# Patient Record
Sex: Female | Born: 1940 | Race: White | Hispanic: No | Smoking: Former smoker
Health system: Southern US, Community
[De-identification: ages and names within clinical notes are randomized; demographics above are authoritative.]

## PROBLEM LIST (undated history)

## (undated) DIAGNOSIS — I639 Cerebral infarction, unspecified: Secondary | ICD-10-CM

## (undated) DIAGNOSIS — E119 Type 2 diabetes mellitus without complications: Secondary | ICD-10-CM

## (undated) DIAGNOSIS — K921 Melena: Secondary | ICD-10-CM

## (undated) DIAGNOSIS — K529 Noninfective gastroenteritis and colitis, unspecified: Secondary | ICD-10-CM

## (undated) DIAGNOSIS — I739 Peripheral vascular disease, unspecified: Secondary | ICD-10-CM

## (undated) DIAGNOSIS — I10 Essential (primary) hypertension: Secondary | ICD-10-CM

## (undated) DIAGNOSIS — H269 Unspecified cataract: Secondary | ICD-10-CM

## (undated) DIAGNOSIS — K219 Gastro-esophageal reflux disease without esophagitis: Secondary | ICD-10-CM

## (undated) DIAGNOSIS — C189 Malignant neoplasm of colon, unspecified: Secondary | ICD-10-CM

## (undated) DIAGNOSIS — J439 Emphysema, unspecified: Secondary | ICD-10-CM

## (undated) DIAGNOSIS — E785 Hyperlipidemia, unspecified: Secondary | ICD-10-CM

## (undated) DIAGNOSIS — C180 Malignant neoplasm of cecum: Secondary | ICD-10-CM

## (undated) DIAGNOSIS — N189 Chronic kidney disease, unspecified: Secondary | ICD-10-CM

## (undated) DIAGNOSIS — R0602 Shortness of breath: Secondary | ICD-10-CM

## (undated) HISTORY — PX: ABDOMINAL HYSTERECTOMY: SHX81

## (undated) HISTORY — DX: Hyperlipidemia, unspecified: E78.5

## (undated) HISTORY — DX: Chronic kidney disease, unspecified: N18.9

## (undated) HISTORY — DX: Peripheral vascular disease, unspecified: I73.9

## (undated) HISTORY — PX: COLONOSCOPY: SHX174

## (undated) HISTORY — DX: Malignant neoplasm of cecum: C18.0

## (undated) HISTORY — PX: TONSILLECTOMY: SUR1361

## (undated) HISTORY — DX: Cerebral infarction, unspecified: I63.9

## (undated) HISTORY — DX: Gastro-esophageal reflux disease without esophagitis: K21.9

## (undated) HISTORY — PX: DENTAL RESTORATION/EXTRACTION WITH X-RAY: SHX5796

## (undated) HISTORY — DX: Melena: K92.1

## (undated) HISTORY — DX: Unspecified cataract: H26.9

## (undated) HISTORY — DX: Emphysema, unspecified: J43.9

## (undated) HISTORY — DX: Noninfective gastroenteritis and colitis, unspecified: K52.9

## (undated) HISTORY — DX: Malignant neoplasm of colon, unspecified: C18.9

## (undated) HISTORY — DX: Essential (primary) hypertension: I10

## (undated) HISTORY — PX: OTHER SURGICAL HISTORY: SHX169

## (undated) SURGERY — Surgical Case
Anesthesia: *Unknown

---

## 2012-11-04 ENCOUNTER — Encounter: Payer: Self-pay | Admitting: Internal Medicine

## 2012-11-04 ENCOUNTER — Ambulatory Visit (INDEPENDENT_AMBULATORY_CARE_PROVIDER_SITE_OTHER): Payer: Medicare Other | Admitting: Internal Medicine

## 2012-11-04 ENCOUNTER — Other Ambulatory Visit (INDEPENDENT_AMBULATORY_CARE_PROVIDER_SITE_OTHER): Payer: Medicare Other

## 2012-11-04 VITALS — BP 162/94 | HR 94 | Temp 97.7°F | Ht 60.0 in | Wt 132.0 lb

## 2012-11-04 DIAGNOSIS — B379 Candidiasis, unspecified: Secondary | ICD-10-CM

## 2012-11-04 DIAGNOSIS — R030 Elevated blood-pressure reading, without diagnosis of hypertension: Secondary | ICD-10-CM

## 2012-11-04 DIAGNOSIS — K921 Melena: Secondary | ICD-10-CM

## 2012-11-04 DIAGNOSIS — R7309 Other abnormal glucose: Secondary | ICD-10-CM

## 2012-11-04 DIAGNOSIS — R7302 Impaired glucose tolerance (oral): Secondary | ICD-10-CM

## 2012-11-04 DIAGNOSIS — R5383 Other fatigue: Secondary | ICD-10-CM

## 2012-11-04 DIAGNOSIS — R5381 Other malaise: Secondary | ICD-10-CM

## 2012-11-04 DIAGNOSIS — R0602 Shortness of breath: Secondary | ICD-10-CM

## 2012-11-04 DIAGNOSIS — B49 Unspecified mycosis: Secondary | ICD-10-CM

## 2012-11-04 LAB — LDL CHOLESTEROL, DIRECT: Direct LDL: 116 mg/dL

## 2012-11-04 LAB — CBC
Hemoglobin: 16.9 g/dL — ABNORMAL HIGH (ref 12.0–15.0)
MCHC: 32 g/dL (ref 30.0–36.0)
MCV: 88.2 fl (ref 78.0–100.0)
Platelets: 192 10*3/uL (ref 150.0–400.0)

## 2012-11-04 LAB — LIPID PANEL
Cholesterol: 207 mg/dL — ABNORMAL HIGH (ref 0–200)
Total CHOL/HDL Ratio: 3

## 2012-11-04 LAB — BASIC METABOLIC PANEL
BUN: 12 mg/dL (ref 6–23)
Creatinine, Ser: 0.8 mg/dL (ref 0.4–1.2)
GFR: 77.51 mL/min (ref >60.00)

## 2012-11-04 LAB — HEMOGLOBIN A1C: Hgb A1c MFr Bld: 6.7 % — ABNORMAL HIGH (ref 4.6–6.5)

## 2012-11-04 MED ORDER — FLUCONAZOLE 150 MG PO TABS: 150.0000 mg | ORAL_TABLET | Freq: Once | ORAL | Status: DC

## 2012-11-04 NOTE — Patient Instructions (Signed)

## 2012-11-04 NOTE — Progress Notes (Signed)
HPI  Pt presents to the clinic today to establish care. She has not had a PCP in the past. She does go to urgent care when she needs something. She does have a number of concerns today. 1- She keeps having these recurrent yeast infections. She has taken 3 doses of Diflucan in the past 2 months. The infection will clear up then come right back. She is unsure why she is getting these recurrent yeast infections but would like another RX for Diflucan as the yeast infection is back today. 2- This past Saturday, she noticed some blood on the toilet paper after she wiped. She has never seen blood in her stool before and hasn't seen any since. She reports that she has never had hemorrhoids. She denies abdominal pain. She has had a colonoscopy x 1 in the past, and is past due for her repeat screening. She has no history of colon cancer in her family. 3- She does c/o some intermittent shortness of breath. This is new for her. She is not currently smoking but did smoke for 55 years. She has no chest pain when she experiences this shortness of breath. She does have a history of emphysema and is concerned that it may be getting worse.  Flu: 07/2012 Pneumovax: 2012 Tetanus: 2014 Pap smear: more than 5 years ago Mammogram: never LMP: hysterectomy Eye doctor: never Dentist: as needed  Past Medical History  Diagnosis Date  . Uterine cancer   . Emphysema of lung   . GERD (gastroesophageal reflux disease)   . Hypertension   . [name]     Current Outpatient Prescriptions  Medication Sig Dispense Refill  . fluconazole (DIFLUCAN) 150 MG tablet Take 1 tablet (150 mg total) by mouth once.  1 tablet  2   No current facility-administered medications for this visit.    No Known Allergies  Family History  Problem Relation Age of Onset  . Hypertension Father   . Diabetes Father   . Cancer Neg Hx   . Early death Neg Hx   . Stroke Neg Hx     History   Social History  . Marital Status: Divorced   Spouse Name: N/A    Number of Children: 1  . Years of Education: N/A   Occupational History  . Not on file.   Social History Main Topics  . Smoking status: Former Smoker -- 55 years  . Smokeless tobacco: Never Used  . Alcohol Use: No  . Drug Use: No  . Sexually Active: No   Other Topics Concern  . Not on file   Social History Narrative   Regular exercise-no   Caffeine Use-yes    ROS:  Constitutional: Pt reports fatiuge. Denies fever, malaise, headache or abrupt weight changes.  HEENT: Denies eye pain, eye redness, ear pain, ringing in the ears, wax buildup, runny nose, nasal congestion, bloody nose, or sore throat. Respiratory: Pt reports shortness of breath. Denies difficulty breathing, cough or sputum production.   Cardiovascular: Denies chest pain, chest tightness, palpitations or swelling in the hands or feet.  Gastrointestinal: Denies abdominal pain, bloating, constipation, diarrhea or blood in the stool.  GU: Denies frequency, urgency, pain with urination, blood in urine, odor or discharge. Musculoskeletal: Denies decrease in range of motion, difficulty with gait, muscle pain or joint pain and swelling.  Skin: Denies redness, rashes, lesions or ulcercations.  Neurological: Denies dizziness, difficulty with memory, difficulty with speech or problems with balance and coordination.   No other specific  complaints in a complete review of systems (except as listed in HPI above).  PE:  BP 162/94  Pulse 94  Temp(Src) 97.7 F (36.5 C) (Oral)  Ht 5' (1.524 m)  Wt 132 lb (59.875 kg)  BMI 25.78 kg/m2  SpO2 95% Wt Readings from Last 3 Encounters:  11/04/12 132 lb (59.875 kg)    General: Appears her stated age, well developed, well nourished in NAD. HEENT: Head: normal shape and size; Eyes: sclera white, no icterus, conjunctiva pink, PERRLA and EOMs intact; Ears: Tm's gray and intact, normal light reflex; Nose: mucosa pink and moist, septum midline; Throat/Mouth: Teeth  present, mucosa pink and moist, no lesions or ulcerations noted.  Neck: Normal range of motion. Neck supple, trachea midline. No massses, lumps or thyromegaly present.  Cardiovascular: Normal rate and rhythm. S1,S2 noted.  No murmur, rubs or gallops noted. No JVD or BLE edema. No carotid bruits noted. Pulmonary/Chest: Normal effort and positive vesicular breath sounds. No respiratory distress. No wheezes, rales or ronchi noted.  Abdomen: Soft and nontender. Normal bowel sounds, no bruits noted. No distention or masses noted. Liver, spleen and kidneys non palpable. Musculoskeletal: Normal range of motion. No signs of joint swelling. No difficulty with gait.  Neurological: Alert and oriented. Cranial nerves II-XII intact. Coordination normal. +DTRs bilaterally. Psychiatric: Mood and affect normal. Behavior is normal. Judgment and thought content normal.    Assessment and Plan:  Preventative Health Maintenance:  Pt declines setting up for pap smear and mammogram Pt would like to be set up for her colonoscopy  Recurrent yeast infections:  eRx for Diflucan 150 mg po x 1 Wash with gentle soap like dove Do not douche  [name], new onset with additional workup required:  Will obtain chest xray to r/o lung etiology If negative, may need to refer to cardiology for stress testing  Fatigue:  Will obtain some basic lab testing  Glucose intolerance:   Will check HgBA1C to assess for diabetes  Elevated blood pressures, not diagnosed as hypertension:  Monitor blood pressures at home If persist above 130/90, call us, we will need to start treatment to reduce potentially harmful outcomes  RTC in6 months or sooner if needed

## 2012-11-06 ENCOUNTER — Encounter: Payer: Self-pay | Admitting: Internal Medicine

## 2012-11-06 ENCOUNTER — Ambulatory Visit (INDEPENDENT_AMBULATORY_CARE_PROVIDER_SITE_OTHER): Payer: Medicare Other | Admitting: Internal Medicine

## 2012-11-06 VITALS — BP 164/88 | HR 77 | Temp 97.6°F | Ht 60.0 in | Wt 132.0 lb

## 2012-11-06 DIAGNOSIS — I10 Essential (primary) hypertension: Secondary | ICD-10-CM

## 2012-11-06 DIAGNOSIS — E119 Type 2 diabetes mellitus without complications: Secondary | ICD-10-CM

## 2012-11-06 DIAGNOSIS — J438 Other emphysema: Secondary | ICD-10-CM

## 2012-11-06 MED ORDER — FLUTICASONE-SALMETEROL 100-50 MCG/DOSE IN AEPB: 1.0000 | INHALATION_SPRAY | Freq: Two times a day (BID) | RESPIRATORY_TRACT | Status: DC

## 2012-11-06 MED ORDER — LISINOPRIL 10 MG PO TABS: 10.0000 mg | ORAL_TABLET | Freq: Every day | ORAL | Status: DC

## 2012-11-06 NOTE — Patient Instructions (Signed)
Diabetes and Exercise Regular exercise is important and can help:   Control blood glucose (sugar).  Decrease blood pressure.    Control blood lipids (cholesterol, triglycerides).  Improve overall health. BENEFITS FROM EXERCISE  Improved fitness.  Improved flexibility.  Improved endurance.  Increased bone density.  Weight control.  Increased muscle strength.  Decreased body fat.  Improvement of the body's use of insulin, a hormone.  Increased insulin sensitivity.  Reduction of insulin needs.  Reduced stress and tension.  Helps you feel better. People with diabetes who add exercise to their lifestyle gain additional benefits, including:  Weight loss.  Reduced appetite.  Improvement of the body's use of blood glucose.  Decreased risk factors for heart disease:  Lowering of cholesterol and triglycerides.  Raising the level of good cholesterol (high-density lipoproteins, HDL).  Lowering blood sugar.  Decreased blood pressure. TYPE 1 DIABETES AND EXERCISE  Exercise will usually lower your blood glucose.  If blood glucose is greater than 240 mg/dl, check urine ketones. If ketones are present, do not exercise.  Location of the insulin injection sites may need to be adjusted with exercise. Avoid injecting insulin into areas of the body that will be exercised. For example, avoid injecting insulin into:  The arms when playing tennis.  The legs when jogging. For more information, discuss this with your caregiver.  Keep a record of:  Food intake.  Type and amount of exercise.  Expected peak times of insulin action.  Blood glucose levels. Do this before, during, and after exercise. Review your records with your caregiver. This will help you to develop guidelines for adjusting food intake and insulin amounts.  TYPE 2 DIABETES AND EXERCISE  Regular physical activity can help control blood glucose.  Exercise is important because it may:  Increase the  body's sensitivity to insulin.  Improve blood glucose control.  Exercise reduces the risk of heart disease. It decreases serum cholesterol and triglycerides. It also lowers blood pressure.  Those who take insulin or oral hypoglycemic agents should watch for signs of hypoglycemia. These signs include dizziness, shaking, sweating, chills, and confusion.  Body water is lost during exercise. It must be replaced. This will help to avoid loss of body fluids (dehydration) or heat stroke. Be sure to talk to your caregiver before starting an exercise program to make sure it is safe for you. Remember, any activity is better than none.  Document Released: 11/10/2003 Document Revised: 11/12/2011 Document Reviewed: 02/24/2009 [facility] Patient Information 2013 [place], [place]. Diabetes and Standards of Medical Care  Diabetes is complicated. You may find that your diabetes team includes a dietitian, nurse, diabetes educator, eye doctor, and more. To help everyone know what is going on and to help you get the care you deserve, the following schedule of care was developed to help keep you on track. Below are the tests, exams, vaccines, medicines, education, and plans you will need. A1c test  Performed at least 2 times a year if you are meeting treatment goals.  Performed 4 times a year if therapy has changed or if you are not meeting therapy/glycemic goals. Aspirin medicine  Take daily as directed by your caregiver. Blood pressure test  Performed at every routine medical visit. The goal is less than 130/80 mm/Hg. Dental exam  Get a dental exam at least 2 times a year. Dilated eye exam (retinal exam)  Type 1 diabetes: Get an exam within 5 years of diagnosis and then yearly.  Type 2 diabetes: Get an exam at diagnosis  and then yearly. All exams thereafter can be extended to every 2 to 3 years if one or more exams have been normal. Foot care exam  Visual foot exams are performed at every routine  medical visit. The exams check for cuts, injuries, or other problems with the feet.  A comprehensive foot exam should be done yearly. This includes visual inspection as well as assessing foot pulses and testing for loss of sensation. Kidney function test (urine microalbumin)  Performed once a year.  Type 1 diabetes: The first test is performed 5 years after diagnosis.  Type 2 diabetes: The first test is performed at the time of diagnosis.  A serum creatinine and estimated glomerular filtration rate (eGFR) test is done once a year to tell the level of chronic kidney disease (CKD), if present. Lipid profile (Cholesterol, HDL, LDL, Triglycerides)  Performed once a year for most people. If at low risk, may be assessed every 2 years.  The goal for LDL is less than 100 mg/dl. If at high risk, the goal is less than 70 mg/dl.  The goal for HDL is higher than 40 mg/dl for men and higher than 50 mg/dl for women.  The goal for triglycerides is less than 150 mg/dl. Flu vaccine, pneumonia vaccine, and hepatitis B vaccine  The flu vaccine is recommended yearly.  The pneumonia vaccine is generally given once in a lifetime. However, there are some instances where another vaccine is recommended. Check with your caregiver.  The hepatitis B vaccine is also recommended for adults with diabetes. Diabetes self-management education  Recommended at diagnosis and ongoing as needed. Treatment plan  Reviewed at every medical visit. Document Released: 06/17/2009 Document Revised: 11/12/2011 Document Reviewed: 02/20/2011 [facility] Patient Information 2013 [place], [place]. Diabetes Meal Planning Guide The diabetes meal planning guide is a tool to help you plan your meals and snacks. It is important for people with diabetes to manage their blood glucose (sugar) levels. Choosing the right foods and the right amounts throughout your day will help control your blood glucose. Eating right can even help you improve  your blood pressure and reach or maintain a healthy weight. CARBOHYDRATE COUNTING MADE EASY When you eat carbohydrates, they turn to sugar. This raises your blood glucose level. Counting carbohydrates can help you control this level so you feel better. When you plan your meals by counting carbohydrates, you can have more flexibility in what you eat and balance your medicine with your food intake. Carbohydrate counting simply means adding up the total amount of carbohydrate grams in your meals and snacks. Try to eat about the same amount at each meal. Foods with carbohydrates are listed below. Each portion below is 1 carbohydrate serving or 15 grams of carbohydrates. Ask your dietician how many grams of carbohydrates you should eat at each meal or snack. Grains and Starches  1 slice bread.   English muffin or hotdog/hamburger bun.   cup cold cereal (unsweetened).   cup cooked pasta or rice.   cup starchy vegetables (corn, potatoes, peas, beans, winter squash).  1 tortilla (6 inches).   bagel.  1 waffle or pancake (size of a CD).   cup cooked cereal.  4 to 6 small crackers. *Whole grain is recommended. Fruit  1 cup fresh unsweetened berries, melon, papaya, pineapple.  1 small fresh fruit.   banana or mango.   cup fruit juice (4 oz unsweetened).   cup canned fruit in natural juice or water.  2 tbs dried fruit.  12 to  15 grapes or cherries. Milk and Yogurt  1 cup fat-free or 1% milk.  1 cup soy milk.  6 oz light yogurt with sugar-free sweetener.  6 oz low-fat soy yogurt.  6 oz plain yogurt. Vegetables  1 cup raw or  cup cooked is counted as 0 carbohydrates or a "free" food.  If you eat 3 or more servings at 1 meal, count them as 1 carbohydrate serving. Other Carbohydrates   oz chips or pretzels.   cup ice cream or frozen yogurt.   cup sherbet or sorbet.  2 inch square cake, no frosting.  1 tbs honey, sugar, jam, jelly, or syrup.  2 small  cookies.  3 squares of graham crackers.  3 cups popcorn.  6 crackers.  1 cup broth-based soup.  Count 1 cup casserole or other mixed foods as 2 carbohydrate servings.  Foods with less than 20 calories in a serving may be counted as 0 carbohydrates or a "free" food. You may want to purchase a book or computer software that lists the carbohydrate gram counts of different foods. In addition, the nutrition facts panel on the labels of the foods you eat are a good source of this information. The label will tell you how big the serving size is and the total number of carbohydrate grams you will be eating per serving. Divide this number by 15 to obtain the number of carbohydrate servings in a portion. Remember, 1 carbohydrate serving equals 15 grams of carbohydrate. SERVING SIZES Measuring foods and serving sizes helps you make sure you are getting the right amount of food. The list below tells how big or small some common serving sizes are.  1 oz.........4 stacked dice.  3 oz........Mar[place] KitchenDeck of cards.  1 tsp.......Marland KitchenTip of little finger.  1 tbs......Marland KitchenMarland KitchenThumb.  2 tbs.......Marland KitchenGolf ball.   cup......Marland KitchenHalf of a fist.  1 cup.......Marland KitchenA fist. SAMPLE DIABETES MEAL PLAN Below is a sample meal plan that includes foods from the grain and starches, dairy, vegetable, fruit, and meat groups. A dietician can individualize a meal plan to fit your calorie needs and tell you the number of servings needed from each food group. However, controlling the total amount of carbohydrates in your meal or snack is more important than making sure you include all of the food groups at every meal. You may interchange carbohydrate containing foods (dairy, starches, and fruits). The meal plan below is an example of a 2000 calorie diet using carbohydrate counting. This meal plan has 17 carbohydrate servings. Breakfast  1 cup oatmeal (2 carb servings).   cup light yogurt (1 carb serving).  1 cup blueberries (1 carb  serving).   cup almonds. Snack  1 large apple (2 carb servings).  1 low-fat string cheese stick. Lunch  Chicken breast salad.  1 cup spinach.   cup chopped tomatoes.  2 oz chicken breast, sliced.  2 tbs low-fat [place] dressing.  12 whole-wheat crackers (2 carb servings).  12 to 15 grapes (1 carb serving).  1 cup low-fat milk (1 carb serving). Snack  1 cup carrots.   cup hummus (1 carb serving). Dinner  3 oz broiled salmon.  1 cup brown rice (3 carb servings). Snack  1  cups steamed broccoli (1 carb serving) drizzled with 1 tsp olive oil and lemon juice.  1 cup light pudding (2 carb servings). DIABETES MEAL PLANNING WORKSHEET Your dietician can use this worksheet to help you decide how many servings of foods and what types of foods are right for you.  BREAKFAST Food Group and Servings / Carb Servings Grain/Starches __________________________________ Dairy __________________________________________ Vegetable ______________________________________ Fruit ___________________________________________ Meat __________________________________________ Fat ____________________________________________ LUNCH Food Group and Servings / Carb Servings Grain/Starches ___________________________________ Dairy ___________________________________________ Fruit ____________________________________________ Meat ___________________________________________ Fat _____________________________________________ Laural Golden Food Group and Servings / Carb Servings Grain/Starches ___________________________________ Dairy ___________________________________________ Fruit ____________________________________________ Meat ___________________________________________ Fat _____________________________________________ SNACKS Food Group and Servings / Carb Servings Grain/Starches ___________________________________ Dairy ___________________________________________ Vegetable  _______________________________________ Fruit ____________________________________________ Meat ___________________________________________ Fat _____________________________________________ DAILY TOTALS Starches _________________________ Vegetable ________________________ Fruit ____________________________ Dairy ____________________________ Meat ____________________________ Fat ______________________________ Document Released: 05/17/2005 Document Revised: 11/12/2011 Document Reviewed: 03/28/2009 [employer] Patient Information 2013 [place], [place]. Diabetes, Eating Away From Home Sometimes, you might eat in a restaurant or have meals that are prepared by someone else. You can enjoy eating out. However, the portions in restaurants may be much larger than needed. Listed below are some ideas to help you choose foods that will keep your blood glucose (sugar) in better control.  TIPS FOR EATING OUT  Know your meal plan and how many carbohydrate servings you should have at each meal. You may wish to carry a copy of your meal plan in your purse or wallet. Learn the foods included in each food group.  Make a list of restaurants near you that offer healthy choices. Take a copy of the carry-out menus to see what they offer. Then, you can plan what you will order ahead of time.  Become familiar with serving sizes by practicing them at home using measuring cups and spoons. Once you learn to recognize portion sizes, you will be able to correctly estimate the amount of total carbohydrate you are allowed to eat at the restaurant. Ask for a takeout box if the portion is more than you should have. When your food comes, leave the amount you should have on the plate, and put the rest in the takeout box before you start eating.  Plan ahead if your mealtime will be different from usual. Check with your caregiver to find out how to time meals and medicine if you are taking insulin.  Avoid high-fat foods, such as  fried foods, cream sauces, high-fat salad dressings, or any added butter or margarine.  Do not be afraid to ask questions. Ask your server about the portion size, cooking methods, ingredients and if items can be substituted. Restaurants do not list all available items on the menu. You can ask for your main entree to be prepared using skim milk, oil instead of butter or margarine, and without gravy or sauces. Ask your waiter or waitress to serve salad dressings, gravy, sauces, margarine, and sour cream on the side. You can then add the amount your meal plan suggests.  Add more vegetables whenever possible.  Avoid items that are labeled "jumbo," "giant," "deluxe," or "supersized."  You may want to split an entre with someone and order an extra side salad.  Watch for hidden calories in foods like croutons, bacon, or cheese.  Ask your server to take away the bread basket or chips from your table.  Order a dinner salad as an appetizer. You can eat most foods served in a restaurant. Some foods are better choices than others. Breads and Starches  Recommended: All kinds of bread (wheat, rye, white, oatmeal, [place], [place], raisin), hard or soft dinner rolls, frankfurter or hamburger buns, small bagels, small corn or whole-wheat flour tortillas.  Avoid: Frosted or glazed breads, butter rolls, egg or cheese breads, croissants, sweet rolls, pastries, coffee cake, glazed or frosted doughnuts, muffins.  Crackers  Recommended: Animal crackers, graham, rye, saltine, oyster, and matzoth crackers. Bread sticks, melba toast, rusks, pretzels, popcorn (without fat), zwieback toast.  Avoid: High-fat snack crackers or chips. Buttered popcorn. Cereals  Recommended: Hot and cold cereals. Whole grains such as oatmeal or shredded wheat are good choices.  Avoid: Sugar-coated or granola type cereals. Potatoes/Pasta/Rice/Beans  Recommended: Order baked, boiled, or mashed potatoes, rice or noodles without added  fat, whole beans. Order gravies, butter, margarine, or sauces on the side so you can control the amount you add.  Avoid: Hash browns or fried potatoes. Potatoes, pasta, or rice prepared with cream or cheese sauce. Potato or pasta salads prepared with large amounts of dressing. Fried beans or fried rice. Vegetables  Recommended: Order steamed, baked, boiled, or stewed vegetables without sauces or extra fat. Ask that sauce be served on the side. If vegetables are not listed on the menu, ask what is available.  Avoid: Vegetables prepared with cream, butter, or cheese sauce. Fried vegetables. Salad Bars  Recommended: Many of the vegetables at a salad bar are considered "free." Use lemon juice, vinegar, or low-calorie salad dressing (fewer than 20 calories per serving) as "free" dressings for your salad. Look for salad bar ingredients that have no added fat or sugar such as tomatoes, lettuce, cucumbers, broccoli, carrots, onions, and mushrooms.  Avoid: Prepared salads with large amounts of dressing, such as coleslaw, caesar salad, macaroni salad, bean salad, or carrot salad. Fruit  Recommended: Eat fresh fruit or fresh fruit salad without added dressing. A salad bar often offers fresh fruit choices, but canned fruit at a restaurant is usually packed in sugar or syrup.  Avoid: Sweetened canned or frozen fruits, plain or sweetened fruit juice. Fruit salads with dressing, sour cream, or sugar added to them. Meat and Meat Substitutes  Recommended: Order broiled, baked, roasted, or grilled meat, poultry, or fish. Trim off all visible fat. Do not eat the skin of poultry. The size stated on the menu is the raw weight. Meat shrinks by  in cooking (for example, 4 oz raw equals 3 oz cooked meat).  Avoid: Deep-fat fried meat, poultry, or fish. Breaded meats. Eggs  Recommended: Order soft, hard-cooked, poached, or scrambled eggs. Omelets may be okay, depending on what ingredients are added. Egg substitutes  are also a good choice.  Avoid: Fried eggs, eggs prepared with cream or cheese sauce. Milk  Recommended: Order low-fat or fat-free milk according to your meal plan. Plain, nonfat yogurt or flavored yogurt with no sugar added may be used as a substitute for milk. Soy milk may also be used.  Avoid: Milk shakes or sweetened milk beverages. Soups and Combination Foods  Recommended: Clear broth or consomm are "free" foods and may be used as an appetizer. Broth-based soups with fat removed count as a starch serving and are preferred over cream soups. Soups made with beans or split peas may be eaten but count as a starch.  Avoid: Fatty soups, soup made with cream, cheese soup. Combination foods prepared with excessive amounts of fat or with cream or cheese sauces. Desserts and Sweets  Recommended: Ask for fresh fruit. Sponge or angel food cake without icing, ice milk, no sugar added ice cream, sherbet, or frozen yogurt may fit into your meal plan occasionally.  Avoid: Pastries, puddings, pies, cakes with icing, custard, gelatin desserts. Fats and Oils  Recommended: Choose healthy fats such as olive oil, canola oil, or tub margarine, reduced fat or fat-free sour cream, cream cheese, avocado,  or nuts.  Avoid: Any fats in excess of your allowed portion. Deep-fried foods or any food with a large amount of fat. Note: Ask for all fats to be served on the side, and limit your portion sizes according to your meal plan. Document Released: 08/20/2005 Document Revised: 11/12/2011 Document Reviewed: 03/10/2009 [facility] Patient Information 2013 [place], [place]. Diabetes, Frequently Asked Questions WHAT IS DIABETES? Most of the food we eat is turned into glucose (sugar). Our bodies use it for energy. The pancreas makes a hormone called insulin. It helps glucose get into the cells of our bodies. When you have diabetes, your body either does not make enough insulin or cannot use its own insulin as well as it  should. This causes sugars to build up in your blood. WHAT ARE THE SYMPTOMS OF DIABETES?  Frequent urination.  Excessive thirst.  Unexplained weight loss.  Extreme hunger.  Blurred vision.  Tingling or numbness in hands or feet.  Feeling very tired much of the time.  Dry, itchy skin.  Sores that are slow to heal.  Yeast infections. WHAT ARE THE TYPES OF DIABETES? Type 1 Diabetes   About 10% of affected people have this type.  Usually occurs before the age of 41.  Usually occurs in thin to normal weight people. Type 2 Diabetes  About 90% of affected people have this type.  Usually occurs after the age of 17.  Usually occurs in overweight people.  More likely to have:  A family history of diabetes.  A history of diabetes during pregnancy (gestational diabetes).  High blood pressure.  High cholesterol and triglycerides. Gestational Diabetes  Occurs in about 4% of pregnancies.  Usually goes away after the baby is born.  More likely to occur in women with:  Family history of diabetes.  Previous gestational diabetes.  Obese.  Over 30 years old. WHAT IS PRE-DIABETES? Pre-diabetes means your blood glucose is higher than normal, but lower than the diabetes range. It also means you are at risk of getting type 2 diabetes and heart disease. If you are told you have pre-diabetes, have your blood glucose checked again in 1 to 2 years. WHAT IS THE TREATMENT FOR DIABETES? Treatment is aimed at keeping blood glucose near normal levels at all times. Learning how to manage this yourself is important in treating diabetes. Depending on the type of diabetes you have, your treatment will include one or more of the following:  Monitoring your blood glucose.  Meal planning.  Exercise.  Oral medicine (pills) or insulin. CAN DIABETES BE PREVENTED? With type 1 diabetes, prevention is more difficult, because the triggers that cause it are not yet known. With type 2  diabetes, prevention is more likely, with lifestyle changes:  Maintain a healthy weight.  Eat healthy.  Exercise. IS THERE A CURE FOR DIABETES? No, there is no cure for diabetes. There is a lot of research going on that is looking for a cure, and progress is being made. Diabetes can be treated and controlled. People with diabetes can manage their diabetes and lead normal, active lives. SHOULD I BE TESTED FOR DIABETES? If you are at least 71 years old, you should be tested for diabetes. You should be tested again every 3 years. If you are 45 or older and overweight, you may want to get tested more often. If you are younger than 45, overweight, and have one or more of the following risk factors, you should be tested:  Family history of diabetes.  Inactive lifestyle.  High blood pressure. WHAT ARE SOME OTHER SOURCES FOR INFORMATION ON DIABETES? The following organizations may help in your search for more information on diabetes: National Diabetes Education Program (NDEP) Internet: SolarDiscussions.es American Diabetes Association Internet: http://www.diabetes.org  Juvenile Diabetes Foundation International Internet: WetlessWash.is Document Released: 08/23/2003 Document Revised: 11/12/2011 Document Reviewed: 06/17/2009 [facility] Patient Information 2013 [place], [place]. Diabetes, Type 2 Diabetes is a long-lasting (chronic) disease. In type 2 diabetes, the pancreas does not make enough insulin (a hormone), and the body does not respond normally to the insulin that is made. This type of diabetes was also previously called adult-onset diabetes. It usually occurs after the age of 16, but it can occur at any age.  CAUSES  Type 2 diabetes happens because the pancreasis not making enough insulin or your body has trouble using the insulin that your pancreas does make properly. SYMPTOMS   Drinking more than usual.  Urinating more than usual.  Blurred vision.  Dry, itchy  skin.  Frequent infections.  Feeling more tired than usual (fatigue). DIAGNOSIS The diagnosis of type 2 diabetes is usually made by one of the following tests:  Fasting blood glucose test. You will not eat for at least 8 hours and then take a blood test.  Random blood glucose test. Your blood glucose (sugar) is checked at any time of the day regardless of when you ate.  Oral glucose tolerance test (OGTT). Your blood glucose is measured after you have not eaten (fasted) and then after you drink a glucose containing beverage. TREATMENT   Healthy eating.  Exercise.  Medicine, if needed.  Monitoring blood glucose.  Seeing your caregiver regularly. HOME CARE INSTRUCTIONS   Check your blood glucose at least once a day. More frequent monitoring may be necessary, depending on your medicines and on how well your diabetes is controlled. Your caregiver will advise you.  Take your medicine as directed by your caregiver.  Do not smoke.  Make wise food choices. Ask your caregiver for information. Weight loss can improve your diabetes.  Learn about low blood glucose (hypoglycemia) and how to treat it.  Get your eyes checked regularly.  Have a yearly physical exam. Have your blood pressure checked and your blood and urine tested.  Wear a pendant or bracelet saying that you have diabetes.  Check your feet every night for cuts, sores, blisters, and redness. Let your caregiver know if you have any problems. SEEK MEDICAL CARE IF:   You have problems keeping your blood glucose in target range.  You have problems with your medicines.  You have symptoms of an illness that do not improve after 24 hours.  You have a sore or wound that is not healing.  You notice a change in vision or a new problem with your vision.  You have a fever. MAKE SURE YOU:  Understand these instructions.  Will watch your condition.  Will get help right away if you are not doing well or get  worse. Document Released: 08/20/2005 Document Revised: 11/12/2011 Document Reviewed: 02/05/2011 [facility] Patient Information 2013 [place], [place].

## 2012-11-06 NOTE — Progress Notes (Signed)
Subjective:    Patient ID: Sherry Lambert, female    DOB: 1940-09-28, 72 y.o.   MRN: [id number]  HPI  Pt presents to the clinic today to f/u labs. At her last visit, her HgBA1C was 6.7%. She has never been told that she has diabetes in the past. She has never been on diabetes medication. She denies blurred vision, increased thirst or urination, or numbness or tingling in the hands or feet. She does eat a lot of fast food and carbohydrates. She has never seen a nutritionist. Additionally, at her last visit, her BP was in the 160/90's. She has never been told that she had high blood pressure. She does not monitor it at home. She denies headaches or uncontrolled bleeding. Today her blood pressure is 164/88. She was also told at her last visit that her chest xray showed signs of emphysema. She is wondering if she should be started on a daily medication to help prevent progression of the disease even though at this time she denies and shortness of breath, wheezing or difficulty breathing. Review of Systems      Past Medical History  Diagnosis Date  . Uterine cancer   . Emphysema of lung   . GERD (gastroesophageal reflux disease)   . Hypertension   . [name]     Current Outpatient Prescriptions  Medication Sig Dispense Refill  . fluconazole (DIFLUCAN) 150 MG tablet Take 1 tablet (150 mg total) by mouth once.  1 tablet  2   No current facility-administered medications for this visit.    No Known Allergies  Family History  Problem Relation Age of Onset  . Hypertension Father   . Diabetes Father   . Cancer Neg Hx   . Early death Neg Hx   . Stroke Neg Hx     History   Social History  . Marital Status: Divorced    Spouse Name: N/A    Number of Children: 1  . Years of Education: N/A   Occupational History  . Not on file.   Social History Main Topics  . Smoking status: Former Smoker -- 55 years  . Smokeless tobacco: Never Used  . Alcohol Use: No  . Drug Use: No  .  Sexually Active: No   Other Topics Concern  . Not on file   Social History Narrative   Regular exercise-no   Caffeine Use-yes     Constitutional: Denies fever, malaise, fatigue, headache or abrupt weight changes.  HEENT: Denies eye pain, eye redness, ear pain, ringing in the ears, wax buildup, runny nose, nasal congestion, bloody nose, or sore throat. Cardiovascular: Denies chest pain, chest tightness, palpitations or swelling in the hands or feet.  GU: Denies urgency, frequency, pain with urination, burning sensation, blood in urine, odor or discharge. Neurological: Denies dizziness, difficulty with memory, difficulty with speech or problems with balance and coordination.   No other specific complaints in a complete review of systems (except as listed in HPI above).  Objective:   Physical Exam  BP 164/88  Pulse 77  Temp(Src) 97.6 F (36.4 C) (Oral)  Ht 5' (1.524 m)  Wt 132 lb (59.875 kg)  BMI 25.78 kg/m2  SpO2 97% Wt Readings from Last 3 Encounters:  11/06/12 132 lb (59.875 kg)  11/04/12 132 lb (59.875 kg)    General: Appears her stated age, well developed, well nourished in NAD. HEENT: Head: normal shape and size; Eyes: sclera white, no icterus, conjunctiva pink, PERRLA and EOMs intact; Ears:  Tm's gray and intact, normal light reflex; Nose: mucosa pink and moist, septum midline; Throat/Mouth: Teeth present, mucosa pink and moist, no exudate, lesions or ulcerations noted.  Cardiovascular: Normal rate and rhythm. S1,S2 noted.  No murmur, rubs or gallops noted. No JVD or BLE edema. No carotid bruits noted. Pulmonary/Chest: Normal effort and positive vesicular breath sounds. No respiratory distress. No wheezes, rales or ronchi noted.  Neurological: Alert and oriented. Cranial nerves II-XII intact. Coordination normal. +DTRs bilaterally.   BMET    Component Value Date/Time   NA 143 11/04/2012 1145   K 4.3 11/04/2012 1145   CL 104 11/04/2012 1145   CO2 30 11/04/2012 1145    GLUCOSE 111* 11/04/2012 1145   BUN 12 11/04/2012 1145   CREATININE 0.8 11/04/2012 1145   CALCIUM 10.3 11/04/2012 1145    Lipid Panel     Component Value Date/Time   CHOL 207* 11/04/2012 1145   TRIG 71.0 11/04/2012 1145   HDL 73.70 11/04/2012 1145   CHOLHDL 3 11/04/2012 1145   VLDL 14.2 11/04/2012 1145    CBC    Component Value Date/Time   WBC 10.7* 11/04/2012 1145   RBC 5.99* 11/04/2012 1145   HGB 16.9* 11/04/2012 1145   HCT 52.9* 11/04/2012 1145   PLT 192.0 11/04/2012 1145   MCV 88.2 11/04/2012 1145   MCHC 32.0 11/04/2012 1145   RDW 13.9 11/04/2012 1145    Hgb A1C Lab Results  Component Value Date   HGBA1C 6.7* 11/04/2012         Assessment & Plan:

## 2012-11-10 ENCOUNTER — Encounter: Payer: Self-pay | Admitting: Internal Medicine

## 2012-11-10 DIAGNOSIS — E118 Type 2 diabetes mellitus with unspecified complications: Secondary | ICD-10-CM | POA: Insufficient documentation

## 2012-11-10 DIAGNOSIS — I10 Essential (primary) hypertension: Secondary | ICD-10-CM | POA: Insufficient documentation

## 2012-11-10 DIAGNOSIS — J438 Other emphysema: Secondary | ICD-10-CM | POA: Insufficient documentation

## 2012-11-10 NOTE — Assessment & Plan Note (Signed)
Discussed disease process with patient and effects that it can have on your health Discussed foods to avoid Will not start blood sugar monitoring at this time Pt would like to try 3 months of lifestyle changes before starting medication Will refer to diabetes education and nutrition   RTC in 3 months to recheck HgbA1C

## 2012-11-10 NOTE — Assessment & Plan Note (Signed)
Start avoiding sodium in your diet Blood pressure monitor given, keep a log of your blood pressures until I see you next time eRx for Lisinopril 10 mg daily  RTC in 1 month for blood pressure check

## 2012-11-10 NOTE — Assessment & Plan Note (Signed)
Will start Advair BID Monitor symptoms and let me know if you feel they are getting worse

## 2012-11-12 ENCOUNTER — Encounter: Payer: Self-pay | Admitting: Gastroenterology

## 2012-12-01 ENCOUNTER — Encounter: Payer: Self-pay | Admitting: Gastroenterology

## 2012-12-01 ENCOUNTER — Ambulatory Visit (INDEPENDENT_AMBULATORY_CARE_PROVIDER_SITE_OTHER): Payer: Medicare Other | Admitting: Gastroenterology

## 2012-12-01 VITALS — BP 134/80 | HR 64 | Ht 60.0 in | Wt 129.6 lb

## 2012-12-01 DIAGNOSIS — R1310 Dysphagia, unspecified: Secondary | ICD-10-CM | POA: Insufficient documentation

## 2012-12-01 DIAGNOSIS — K625 Hemorrhage of anus and rectum: Secondary | ICD-10-CM

## 2012-12-01 NOTE — Assessment & Plan Note (Signed)
Limited rectal bleeding maybe hemorrhoidal. A more proximal colonic bleeding source should be ruled out.  Recommendations #1 colonoscopy

## 2012-12-01 NOTE — Progress Notes (Signed)
History of Present Illness: Pleasant 71-year-old white female referred for evaluation of dysphagia and rectal bleeding. For the past year she has been experiencing intermittent dysphagia. At times she has had to cough up food that has become lodged. She rarely has pyrosis. Patient also complains of intermittent rectal bleeding consisting of bright red blood on the toilet tissue. Since starting a low fat and sugar diet she's been somewhat constipated. Last colonoscopy was greater than 20 years ago. She denies melena or abdominal pain.    Past Medical History  Diagnosis Date  . Uterine cancer   . Emphysema of lung   . GERD (gastroesophageal reflux disease)   . Hypertension   . [name]   . Diabetes mellitus    Past Surgical History  Procedure Laterality Date  . Tonsillectomy    . Abdominal hysterectomy     family history includes Diabetes in her father and Hypertension in her father.  There is no history of Cancer, and Early death, and Stroke, . Current Outpatient Prescriptions  Medication Sig Dispense Refill  . Fluticasone-Salmeterol (ADVAIR) 100-50 MCG/DOSE AEPB Inhale 1 puff into the lungs 2 (two) times daily.  1 each  3  . lisinopril (PRINIVIL,ZESTRIL) 10 MG tablet Take 1 tablet (10 mg total) by mouth daily.  30 tablet  0   No current facility-administered medications for this visit.   Allergies as of 12/01/2012  . (No Known Allergies)    reports that she has quit smoking. She has never used smokeless tobacco. She reports that she does not drink alcohol or use illicit drugs.     Review of Systems: Pertinent positive and negative review of systems were noted in the above HPI section. All other review of systems were otherwise negative.  Vital signs were reviewed in today's medical record Physical Exam: General: Well developed , well nourished, no acute distress Skin: anicteric Head: Normocephalic and atraumatic Eyes:  sclerae anicteric, EOMI Ears: Normal auditory  acuity Mouth: No deformity or lesions Neck: Supple, no masses or thyromegaly Lungs: Clear throughout to auscultation Heart: Regular rate and rhythm; no murmurs, rubs or bruits Abdomen: Soft, non tender and non distended. No masses, hepatosplenomegaly or hernias noted. Normal Bowel sounds Rectal:deferred Musculoskeletal: Symmetrical with no gross deformities  Skin: No lesions on visible extremities Pulses:  Normal pulses noted Extremities: No clubbing, cyanosis, edema or deformities noted Neurological: Alert oriented x 4, grossly nonfocal Cervical Nodes:  No significant cervical adenopathy Inguinal Nodes: No significant inguinal adenopathy Psychological:  Alert and cooperative. Normal mood and affect       

## 2012-12-01 NOTE — Assessment & Plan Note (Signed)
Dysphagia to solids. 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  Recommendations #1 upper endoscopy with dilatation as indicated  Risks, alternatives, and complications of the procedure, including bleeding, perforation, and possible need for surgery, were explained to the patient.  Patient's questions were answered.

## 2012-12-01 NOTE — Patient Instructions (Addendum)
You have been scheduled for an endoscopy with propofol. Please follow written instructions given to you at your visit today. If you use inhalers (even only as needed), please bring them with you on the day of your procedure.  You have been scheduled for a colonoscopy with propofol. Please follow written instructions given to you at your visit today.  Please pick up your prep kit at the pharmacy within the next 1-3 days. If you use inhalers (even only as needed), please bring them with you on the day of your procedure.

## 2012-12-04 ENCOUNTER — Ambulatory Visit: Payer: Medicare Other | Admitting: *Deleted

## 2012-12-04 ENCOUNTER — Encounter: Payer: Self-pay | Admitting: Gastroenterology

## 2012-12-04 ENCOUNTER — Other Ambulatory Visit: Payer: Self-pay | Admitting: Gastroenterology

## 2012-12-04 ENCOUNTER — Ambulatory Visit (AMBULATORY_SURGERY_CENTER): Payer: Medicare Other | Admitting: Gastroenterology

## 2012-12-04 VITALS — BP 118/64 | HR 72 | Temp 96.7°F | Resp 17 | Ht 60.0 in | Wt 129.0 lb

## 2012-12-04 DIAGNOSIS — R1310 Dysphagia, unspecified: Secondary | ICD-10-CM

## 2012-12-04 DIAGNOSIS — K222 Esophageal obstruction: Secondary | ICD-10-CM

## 2012-12-04 MED ORDER — OMEPRAZOLE 20 MG PO CPDR: 20.0000 mg | DELAYED_RELEASE_CAPSULE | Freq: Every day | ORAL | Status: DC

## 2012-12-04 MED ORDER — DEXTROSE 5 % IV SOLN: INTRAVENOUS | Status: DC

## 2012-12-04 NOTE — Progress Notes (Signed)
Patient did not experience any of the following events: a burn prior to discharge; a fall within the facility; wrong site/side/patient/procedure/implant event; or a hospital transfer or hospital admission upon discharge from the facility. (G8907) Patient did not have preoperative order for IV antibiotic SSI prophylaxis. (G8918)  

## 2012-12-04 NOTE — Progress Notes (Signed)
Called to room to assist during endoscopic procedure.  Patient ID and intended procedure confirmed with present staff. Received instructions for my participation in the procedure from the performing physician.  

## 2012-12-04 NOTE — Op Note (Addendum)
[facility] [address] [place], [address]   ENDOSCOPY PROCEDURE REPORT  PATIENT: Sherry Lambert, Sherry Lambert  MR#: [id number] BIRTHDATE: 09-Apr-1941 , 71  yrs. old GENDER: Female ENDOSCOPIST: Louis Meckel, MD REFERRED BY: PROCEDURE DATE:  12/04/2012 PROCEDURE:  endoscopy with balloon ASA CLASS:     2 INDICATIONS:  dysphagia MEDICATIONS: propofol 150 mg IV TOPICAL ANESTHETIC:  DESCRIPTION OF PROCEDURE: After the risks benefits and alternatives of the procedure were thoroughly explained, informed consent was obtained.  The LB GIF-H180 [id number] endoscope was introduced through the mouth and advanced to the  The remainder of the exam is normal.GE junction. There was a tight stricture that did not allow passage of the 7 mm scope.surrounding mucosa was friable A balloon dilator was passed through the stricture inflated to 10 mm then 11 and 12 mm for 30 seconds each.  there was moderate resistance and heme. Following withdrawal of the balloon the scope easily traversed the area was passed into the stomach and duodenum.     . Without limitations.  The instrument was slowly withdrawn as the mucosa was fully examined.      .  retroflexed view the gastric cardia was normal     The scope was then withdrawn from the patient and the procedure completed.  COMPLICATIONS: There were no complications. 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

## 2012-12-04 NOTE — Patient Instructions (Addendum)
YOU HAD AN ENDOSCOPIC PROCEDURE TODAY AT THE [facility]: Refer to the procedure report that was given to you for any specific questions about what was found during the examination.  If the procedure report does not answer your questions, please call your gastroenterologist to clarify.  If you requested that your care partner not be given the details of your procedure findings, then the procedure report has been included in a sealed envelope for you to review at your convenience later.  YOU SHOULD EXPECT: Some feelings of bloating in the abdomen. Passage of more gas than usual.  Walking can help get rid of the air that was put into your GI tract during the procedure and reduce the bloating. If you had a lower endoscopy (such as a colonoscopy or flexible sigmoidoscopy) you may notice spotting of blood in your stool or on the toilet paper. If you underwent a bowel prep for your procedure, then you may not have a normal bowel movement for a few days.  DIET:   Follow dilatation diet given to you today  ACTIVITY: Your care partner should take you home directly after the procedure.  You should plan to take it easy, moving slowly for the rest of the day.  You can resume normal activity the day after the procedure however you should NOT DRIVE or use heavy machinery for 24 hours (because of the sedation medicines used during the test).    SYMPTOMS TO REPORT IMMEDIATELY: A gastroenterologist can be reached at any hour.  During normal business hours, 8:30 AM to 5:00 PM Monday through Friday, call [phone].  After hours and on weekends, please call the GI answering service at [phone] who will take a message and have the physician on call contact you.     Following upper endoscopy (EGD)  Vomiting of blood or coffee ground material  New chest pain or pain under the shoulder blades  Painful or persistently difficult swallowing  New shortness of breath  Fever of 100F or higher  Black,  tarry-looking stools  FOLLOW UP: If any biopsies were taken you will be contacted by phone or by letter within the next 1-3 weeks.  Call your gastroenterologist if you have not heard about the biopsies in 3 weeks.  Our staff will call the home number listed on your records the next business day following your procedure to check on you and address any questions or concerns that you may have at that time regarding the information given to you following your procedure. This is a courtesy call and so if there is no answer at the home number and we have not heard from you through the emergency physician on call, we will assume that you have returned to your regular daily activities without incident.  SIGNATURES/CONFIDENTIALITY: You and/or your care partner have signed paperwork which will be entered into your electronic medical record.  These signatures attest to the fact that that the information above on your After Visit Summary has been reviewed and is understood.  Full responsibility of the confidentiality of this discharge information lies with you and/or your care-partner.    Dilatation diet   Pick up omeprazole RX at your pharmacy

## 2012-12-04 NOTE — Progress Notes (Signed)
Stable to RR 

## 2012-12-05 ENCOUNTER — Telehealth: Payer: Self-pay | Admitting: *Deleted

## 2012-12-05 NOTE — Telephone Encounter (Signed)
  Follow up Call-  Call back number 12/04/2012  Post procedure Call Back phone  # [phone]  Permission to leave phone message Yes     Patient questions:  Do you have a fever, pain , or abdominal swelling? no Pain Score  0 *  Have you tolerated food without any problems? yes  Have you been able to return to your normal activities? yes  Do you have any questions about your discharge instructions: Diet   no Medications  no Follow up visit  no  Do you have questions or concerns about your Care? no  Actions: * If pain score is 4 or above: No action needed, pain <4.  C/O some throat irritation, states, "It's nothing too bad."  Told to call back if she has any concerns

## 2012-12-09 ENCOUNTER — Telehealth: Payer: Self-pay

## 2012-12-09 ENCOUNTER — Ambulatory Visit (INDEPENDENT_AMBULATORY_CARE_PROVIDER_SITE_OTHER): Payer: Medicare Other | Admitting: Internal Medicine

## 2012-12-09 ENCOUNTER — Encounter: Payer: Self-pay | Admitting: Internal Medicine

## 2012-12-09 VITALS — BP 130/86 | HR 76 | Temp 97.4°F | Ht 60.0 in | Wt 130.0 lb

## 2012-12-09 DIAGNOSIS — I10 Essential (primary) hypertension: Secondary | ICD-10-CM

## 2012-12-09 DIAGNOSIS — J438 Other emphysema: Secondary | ICD-10-CM

## 2012-12-09 MED ORDER — FLUTICASONE-SALMETEROL 100-50 MCG/DOSE IN AEPB: 1.0000 | INHALATION_SPRAY | Freq: Two times a day (BID) | RESPIRATORY_TRACT | Status: DC

## 2012-12-09 MED ORDER — LISINOPRIL 10 MG PO TABS: 10.0000 mg | ORAL_TABLET | Freq: Every day | ORAL | Status: DC

## 2012-12-09 NOTE — Assessment & Plan Note (Signed)
Well controlled on current therapy Continue lisinopril Avoid sodium in your diet

## 2012-12-09 NOTE — Patient Instructions (Signed)

## 2012-12-09 NOTE — Progress Notes (Signed)
Subjective:    Patient ID: Sherry Lambert, female    DOB: 08-27-1941, 72 y.o.   MRN: [id number]  HPI  Pt presents to the clinic today to f/u HTN. At her last visit, she was started on lisinopril. She has been tolerating the medication well without any side effects. Her blood pressure has been well controlled.  Review of Systems      Past Medical History  Diagnosis Date  . Uterine cancer   . Emphysema of lung   . GERD (gastroesophageal reflux disease)   . Hypertension   . [name]   . Diabetes mellitus     Current Outpatient Prescriptions  Medication Sig Dispense Refill  . Fluticasone-Salmeterol (ADVAIR) 100-50 MCG/DOSE AEPB Inhale 1 puff into the lungs 2 (two) times daily.  1 each  3  . lisinopril (PRINIVIL,ZESTRIL) 10 MG tablet Take 1 tablet (10 mg total) by mouth daily.  30 tablet  0  . omeprazole (PRILOSEC) 20 MG capsule Take 1 capsule (20 mg total) by mouth daily.  90 capsule  3   No current facility-administered medications for this visit.    No Known Allergies  Family History  Problem Relation Age of Onset  . Hypertension Father   . Diabetes Father   . Cancer Neg Hx   . Early death Neg Hx   . Stroke Neg Hx     History   Social History  . Marital Status: Divorced    Spouse Name: N/A    Number of Children: 1  . Years of Education: N/A   Occupational History  . Retired    Social History Main Topics  . Smoking status: Former Smoker -- 55 years  . Smokeless tobacco: Never Used  . Alcohol Use: No  . Drug Use: No  . Sexually Active: No   Other Topics Concern  . Not on file   Social History Narrative   Regular exercise-no   Caffeine Use-yes     Constitutional: Denies fever, malaise, fatigue, headache or abrupt weight changes.  Respiratory: Denies difficulty breathing, shortness of breath, cough or sputum production.   Cardiovascular: Denies chest pain, chest tightness, palpitations or swelling in the hands or feet. .  Neurological: Denies  dizziness, difficulty with memory, difficulty with speech or problems with balance and coordination.   No other specific complaints in a complete review of systems (except as listed in HPI above).  Objective:   Physical Exam  BP 130/86  Pulse 76  Temp(Src) 97.4 F (36.3 C) (Oral)  Ht 5' (1.524 m)  Wt 130 lb (58.968 kg)  BMI 25.39 kg/m2  SpO2 96% Wt Readings from Last 3 Encounters:  12/09/12 130 lb (58.968 kg)  12/04/12 129 lb (58.514 kg)  12/01/12 129 lb 9.6 oz (58.786 kg)    General: Appears her stated age, well developed, well nourished in NAD. Cardiovascular: Normal rate and rhythm. S1,S2 noted.  No murmur, rubs or gallops noted. No JVD or BLE edema. No carotid bruits noted. Pulmonary/Chest: Normal effort and positive vesicular breath sounds. No respiratory distress. No wheezes, rales or ronchi noted.  Neurological: Alert and oriented. Cranial nerves II-XII intact. Coordination normal. +DTRs bilaterally.   BMET    Component Value Date/Time   NA 143 11/04/2012 1145   K 4.3 11/04/2012 1145   CL 104 11/04/2012 1145   CO2 30 11/04/2012 1145   GLUCOSE 111* 11/04/2012 1145   BUN 12 11/04/2012 1145   CREATININE 0.8 11/04/2012 1145   CALCIUM 10.3 11/04/2012 1145  Lipid Panel     Component Value Date/Time   CHOL 207* 11/04/2012 1145   TRIG 71.0 11/04/2012 1145   HDL 73.70 11/04/2012 1145   CHOLHDL 3 11/04/2012 1145   VLDL 14.2 11/04/2012 1145    CBC    Component Value Date/Time   WBC 10.7* 11/04/2012 1145   RBC 5.99* 11/04/2012 1145   HGB 16.9* 11/04/2012 1145   HCT 52.9* 11/04/2012 1145   PLT 192.0 11/04/2012 1145   MCV 88.2 11/04/2012 1145   MCHC 32.0 11/04/2012 1145   RDW 13.9 11/04/2012 1145    Hgb A1C Lab Results  Component Value Date   HGBA1C 6.7* 11/04/2012         Assessment & Plan:

## 2012-12-09 NOTE — Telephone Encounter (Signed)
Pt had EGD with dil 12/04/12. Per Dr. Arlyce Dice pt needs to have a repeat EGD with dil at the hospital around the week of April 21st. Left message for pt to call back regarding scheduling this appt.

## 2012-12-10 ENCOUNTER — Other Ambulatory Visit: Payer: Self-pay | Admitting: Gastroenterology

## 2012-12-10 DIAGNOSIS — R1319 Other dysphagia: Secondary | ICD-10-CM

## 2012-12-10 NOTE — Telephone Encounter (Signed)
Pt scheduled for EGD with dil at [facility] 12/23/12. Pt to arrive there at 7am for an 8am appt. Pt to be NPO after midnight, she is aware of appt date and time.

## 2012-12-10 NOTE — Telephone Encounter (Signed)
Left message for pt to call back  °

## 2012-12-11 ENCOUNTER — Telehealth: Payer: Self-pay | Admitting: Gastroenterology

## 2012-12-11 NOTE — Telephone Encounter (Signed)
Explained to pt that Dr. Arlyce Dice does not usually do balloon dils in the [facility] and that is why she is scheduled at [facility]

## 2012-12-23 ENCOUNTER — Encounter (HOSPITAL_COMMUNITY): Payer: Self-pay | Admitting: *Deleted

## 2012-12-23 ENCOUNTER — Encounter (HOSPITAL_COMMUNITY)
Admission: RE | Disposition: A | Discharge status: Home / Self Care | Payer: Self-pay | Source: Ambulatory Visit | Attending: Gastroenterology

## 2012-12-23 ENCOUNTER — Ambulatory Visit (HOSPITAL_COMMUNITY)
Admission: RE | Admit: 2012-12-23 | Discharge: 2012-12-23 | Disposition: A | Discharge status: Home / Self Care | Payer: Medicare Other | Source: Ambulatory Visit | Attending: Gastroenterology | Admitting: Gastroenterology

## 2012-12-23 DIAGNOSIS — I10 Essential (primary) hypertension: Secondary | ICD-10-CM | POA: Insufficient documentation

## 2012-12-23 DIAGNOSIS — K5900 Constipation, unspecified: Secondary | ICD-10-CM | POA: Insufficient documentation

## 2012-12-23 DIAGNOSIS — J438 Other emphysema: Secondary | ICD-10-CM | POA: Insufficient documentation

## 2012-12-23 DIAGNOSIS — K219 Gastro-esophageal reflux disease without esophagitis: Secondary | ICD-10-CM | POA: Insufficient documentation

## 2012-12-23 DIAGNOSIS — E119 Type 2 diabetes mellitus without complications: Secondary | ICD-10-CM | POA: Insufficient documentation

## 2012-12-23 DIAGNOSIS — R1319 Other dysphagia: Secondary | ICD-10-CM

## 2012-12-23 DIAGNOSIS — K222 Esophageal obstruction: Secondary | ICD-10-CM

## 2012-12-23 DIAGNOSIS — K625 Hemorrhage of anus and rectum: Secondary | ICD-10-CM | POA: Insufficient documentation

## 2012-12-23 DIAGNOSIS — Z8542 Personal history of malignant neoplasm of other parts of uterus: Secondary | ICD-10-CM | POA: Insufficient documentation

## 2012-12-23 HISTORY — PX: BALLOON DILATION: SHX5330

## 2012-12-23 HISTORY — DX: Shortness of breath: R06.02

## 2012-12-23 HISTORY — PX: ESOPHAGOGASTRODUODENOSCOPY: SHX5428

## 2012-12-23 SURGERY — EGD (ESOPHAGOGASTRODUODENOSCOPY)
Anesthesia: Moderate Sedation

## 2012-12-23 MED ORDER — BUTAMBEN-TETRACAINE-BENZOCAINE 2-2-14 % EX AERO
INHALATION_SPRAY | CUTANEOUS | Status: DC | PRN
  Administered 2012-12-23: 2 via TOPICAL

## 2012-12-23 MED ORDER — MIDAZOLAM HCL 10 MG/2ML IJ SOLN
INTRAMUSCULAR | Status: AC
  Filled 2012-12-23: qty 2

## 2012-12-23 MED ORDER — MIDAZOLAM HCL 10 MG/2ML IJ SOLN
INTRAMUSCULAR | Status: DC | PRN
  Administered 2012-12-23: 2 mg via INTRAVENOUS
  Administered 2012-12-23: 1 mg via INTRAVENOUS
  Administered 2012-12-23: 2 mg via INTRAVENOUS

## 2012-12-23 MED ORDER — FENTANYL CITRATE 0.05 MG/ML IJ SOLN
INTRAMUSCULAR | Status: DC | PRN
  Administered 2012-12-23 (×2): 25 ug via INTRAVENOUS

## 2012-12-23 MED ORDER — SODIUM CHLORIDE 0.9 % IV SOLN: INTRAVENOUS | Status: DC

## 2012-12-23 MED ORDER — FENTANYL CITRATE 0.05 MG/ML IJ SOLN
INTRAMUSCULAR | Status: AC
  Filled 2012-12-23: qty 2

## 2012-12-23 NOTE — Interval H&P Note (Signed)
History and Physical Interval Note:  12/23/2012 8:08 AM  [name]  has presented today for surgery, with the diagnosis of Dysphagia [787.20]  The various methods of treatment have been discussed with the patient and family. After consideration of risks, benefits and other options for treatment, the patient has consented to  Procedure(s): ESOPHAGOGASTRODUODENOSCOPY (EGD) (N/A) BALLOON DILATION (N/A) as a surgical intervention .  The patient's history has been reviewed, patient examined, no change in status, stable for surgery.  I have reviewed the patient's chart and labs.  Questions were answered to the patient's satisfaction.    The recent H&P (dated *12/01/12**) was reviewed, the patient was examined and there is no change in the patients condition since that H&P was completed.   Melvia Heaps  12/23/2012, 8:08 AM    Melvia Heaps

## 2012-12-23 NOTE — H&P (View-Only) (Signed)
History of Present Illness: Pleasant 72 year old white female referred for evaluation of dysphagia and rectal bleeding. For the past year she has been experiencing intermittent dysphagia. At times she has had to cough up food that has become lodged. She rarely has pyrosis. Patient also complains of intermittent rectal bleeding consisting of bright red blood on the toilet tissue. Since starting a low fat and sugar diet she's been somewhat constipated. Last colonoscopy was greater than 20 years ago. She denies melena or abdominal pain.    Past Medical History  Diagnosis Date  . Uterine cancer   . Emphysema of lung   . GERD (gastroesophageal reflux disease)   . Hypertension   . [name]   . Diabetes mellitus    Past Surgical History  Procedure Laterality Date  . Tonsillectomy    . Abdominal hysterectomy     family history includes Diabetes in her father and Hypertension in her father.  There is no history of Cancer, and Early death, and Stroke, . Current Outpatient Prescriptions  Medication Sig Dispense Refill  . Fluticasone-Salmeterol (ADVAIR) 100-50 MCG/DOSE AEPB Inhale 1 puff into the lungs 2 (two) times daily.  1 each  3  . lisinopril (PRINIVIL,ZESTRIL) 10 MG tablet Take 1 tablet (10 mg total) by mouth daily.  30 tablet  0   No current facility-administered medications for this visit.   Allergies as of 12/01/2012  . (No Known Allergies)    reports that she has quit smoking. She has never used smokeless tobacco. She reports that she does not drink alcohol or use illicit drugs.     Review of Systems: Pertinent positive and negative review of systems were noted in the above HPI section. All other review of systems were otherwise negative.  Vital signs were reviewed in today's medical record Physical Exam: General: Well developed , well nourished, no acute distress Skin: anicteric Head: Normocephalic and atraumatic Eyes:  sclerae anicteric, EOMI Ears: Normal auditory  acuity Mouth: No deformity or lesions Neck: Supple, no masses or thyromegaly Lungs: Clear throughout to auscultation Heart: Regular rate and rhythm; no murmurs, rubs or bruits Abdomen: Soft, non tender and non distended. No masses, hepatosplenomegaly or hernias noted. Normal Bowel sounds Rectal:deferred Musculoskeletal: Symmetrical with no gross deformities  Skin: No lesions on visible extremities Pulses:  Normal pulses noted Extremities: No clubbing, cyanosis, edema or deformities noted Neurological: Alert oriented x 4, grossly nonfocal Cervical Nodes:  No significant cervical adenopathy Inguinal Nodes: No significant inguinal adenopathy Psychological:  Alert and cooperative. Normal mood and affect

## 2012-12-23 NOTE — Op Note (Addendum)
[facility] [address] [place], [address]   ENDOSCOPY PROCEDURE REPORT  PATIENT: Sherry Lambert, Sherry Lambert  MR#: [id number] BIRTHDATE: 10-Jun-1941 , 71  yrs. old GENDER: Female ENDOSCOPIST: Louis Meckel, MD REFERRED BY: PROCEDURE DATE:  12/23/2012 PROCEDURE:  EGD with balloon dilatation ASA CLASS:     Class II INDICATIONS:  follow-up of stricture. MEDICATIONS: These medications were titrated to patient response per physician's verbal order, Versed 5 mg IV, and Fentanyl 50 mcg IV TOPICAL ANESTHETIC: Cetacaine Spray  DESCRIPTION OF PROCEDURE: After the risks benefits and alternatives of the procedure were thoroughly explained, informed consent was obtained.  The    endoscope was introduced through the mouth and advanced to the third portion of the duodenum. Without limitations. The instrument was slowly withdrawn as the mucosa was fully examined.      ESOPHAGUS: A stricture was found at the gastroesophageal junction. The stenosis was traversable with the endoscope.  The remainder of the upper endoscopy exam was otherwise normal.  no abnormalities.   #s 12-13.5-15mm TTS dilating balloons were inflated for 30 seconds each. There was moderate resistance and a minimal amount of heme.  The scope was then withdrawn from the patient and the procedure completed.  COMPLICATIONS: There were no complications.  ENDOSCOPIC IMPRESSION: Esophageal stricture - s/p balloon dilitation  RECOMMENDATIONS: continue PPI therapy  REPEAT EXAM: EGD with balloon dilitation 2-3 weeks  eSigned:  Louis Meckel, MD 12/23/2012 8:38 AM Revised: 12/23/2012 8:38 AM  HY:QMVH V.  Plotnikov, MD

## 2012-12-24 ENCOUNTER — Encounter (HOSPITAL_COMMUNITY): Payer: Self-pay | Admitting: Gastroenterology

## 2012-12-25 ENCOUNTER — Other Ambulatory Visit: Payer: Self-pay | Admitting: Gastroenterology

## 2012-12-25 ENCOUNTER — Telehealth: Payer: Self-pay | Admitting: Gastroenterology

## 2012-12-25 DIAGNOSIS — K222 Esophageal obstruction: Secondary | ICD-10-CM

## 2012-12-25 NOTE — Telephone Encounter (Signed)
Pt needs to schedule EGD. Orders in epic, will call in am to schedule at [facility].

## 2012-12-26 NOTE — Telephone Encounter (Signed)
Spoke with pt and she is aware of appt date and time. Pt also aware of prep instructions.

## 2012-12-26 NOTE — Telephone Encounter (Signed)
Pt scheduled for EGD with dil at [facility] 01/15/13. Pt to arrive there at 11:30am for a 12:30pm appt. Left message for pt to call back.

## 2013-01-15 ENCOUNTER — Ambulatory Visit (HOSPITAL_COMMUNITY)
Admission: RE | Admit: 2013-01-15 | Discharge: 2013-01-15 | Disposition: A | Discharge status: Home / Self Care | Payer: Medicare Other | Source: Ambulatory Visit | Attending: Gastroenterology | Admitting: Gastroenterology

## 2013-01-15 ENCOUNTER — Encounter: Payer: Medicare Other | Admitting: Gastroenterology

## 2013-01-15 ENCOUNTER — Encounter (HOSPITAL_COMMUNITY)
Admission: RE | Disposition: A | Discharge status: Home / Self Care | Payer: Self-pay | Source: Ambulatory Visit | Attending: Gastroenterology

## 2013-01-15 ENCOUNTER — Encounter (HOSPITAL_COMMUNITY): Payer: Self-pay | Admitting: *Deleted

## 2013-01-15 DIAGNOSIS — Z8542 Personal history of malignant neoplasm of other parts of uterus: Secondary | ICD-10-CM | POA: Insufficient documentation

## 2013-01-15 DIAGNOSIS — E119 Type 2 diabetes mellitus without complications: Secondary | ICD-10-CM | POA: Insufficient documentation

## 2013-01-15 DIAGNOSIS — Z87891 Personal history of nicotine dependence: Secondary | ICD-10-CM | POA: Insufficient documentation

## 2013-01-15 DIAGNOSIS — J438 Other emphysema: Secondary | ICD-10-CM | POA: Insufficient documentation

## 2013-01-15 DIAGNOSIS — K219 Gastro-esophageal reflux disease without esophagitis: Secondary | ICD-10-CM | POA: Insufficient documentation

## 2013-01-15 DIAGNOSIS — I10 Essential (primary) hypertension: Secondary | ICD-10-CM | POA: Insufficient documentation

## 2013-01-15 DIAGNOSIS — K222 Esophageal obstruction: Secondary | ICD-10-CM | POA: Insufficient documentation

## 2013-01-15 HISTORY — PX: ESOPHAGOGASTRODUODENOSCOPY: SHX5428

## 2013-01-15 HISTORY — PX: BALLOON DILATION: SHX5330

## 2013-01-15 SURGERY — EGD (ESOPHAGOGASTRODUODENOSCOPY)
Anesthesia: Moderate Sedation

## 2013-01-15 MED ORDER — FENTANYL CITRATE 0.05 MG/ML IJ SOLN
INTRAMUSCULAR | Status: DC | PRN
  Administered 2013-01-15 (×3): 25 ug via INTRAVENOUS

## 2013-01-15 MED ORDER — SODIUM CHLORIDE 0.9 % IV SOLN
INTRAVENOUS | Status: DC
Start: 2013-01-15 — End: 2013-01-15
  Administered 2013-01-15: 13:00:00 via INTRAVENOUS

## 2013-01-15 MED ORDER — DIPHENHYDRAMINE HCL 50 MG/ML IJ SOLN
INTRAMUSCULAR | Status: AC
  Filled 2013-01-15: qty 1

## 2013-01-15 MED ORDER — FENTANYL CITRATE 0.05 MG/ML IJ SOLN
INTRAMUSCULAR | Status: AC
  Filled 2013-01-15: qty 2

## 2013-01-15 MED ORDER — BUTAMBEN-TETRACAINE-BENZOCAINE 2-2-14 % EX AERO
INHALATION_SPRAY | CUTANEOUS | Status: DC | PRN
  Administered 2013-01-15: 2 via TOPICAL

## 2013-01-15 MED ORDER — MIDAZOLAM HCL 10 MG/2ML IJ SOLN
INTRAMUSCULAR | Status: DC | PRN
  Administered 2013-01-15: 2 mg via INTRAVENOUS
  Administered 2013-01-15: 1 mg via INTRAVENOUS
  Administered 2013-01-15: 2 mg via INTRAVENOUS

## 2013-01-15 MED ORDER — MIDAZOLAM HCL 10 MG/2ML IJ SOLN
INTRAMUSCULAR | Status: AC
  Filled 2013-01-15: qty 2

## 2013-01-15 NOTE — H&P (Signed)
  History of Present Illness: The patient has been undergoing sequential dilatation of an esophageal stricture.  She is here for a f/u exam and dilation.    Past Medical History  Diagnosis Date  . Uterine cancer   . Emphysema of lung   . GERD (gastroesophageal reflux disease)   . Hypertension   . [name]   . Diabetes mellitus   . [name]    Past Surgical History  Procedure Laterality Date  . Tonsillectomy    . Abdominal hysterectomy    . Colonoscopy    . Esophagogastroduodenoscopy N/A 12/23/2012    Procedure: ESOPHAGOGASTRODUODENOSCOPY (EGD);  Surgeon: Louis Meckel, MD;  Location: [facility];  Service: Endoscopy;  Laterality: N/A;  . Balloon dilation N/A 12/23/2012    Procedure: BALLOON DILATION;  Surgeon: Louis Meckel, MD;  Location: [facility];  Service: Endoscopy;  Laterality: N/A;   family history includes Diabetes in her father and Hypertension in her father.  There is no history of Cancer, and Early death, and Stroke, . No current facility-administered medications for this encounter.   Allergies as of 12/25/2012  . (No Known Allergies)    reports that she has quit smoking. She has never used smokeless tobacco. She reports that she does not drink alcohol or use illicit drugs.     Review of Systems: Pertinent positive and negative review of systems were noted in the above HPI section. All other review of systems were otherwise negative.  Vital signs were reviewed in today's medical record Physical Exam: General: Well developed , well nourished, no acute distress Skin: anicteric Head: Normocephalic and atraumatic Eyes:  sclerae anicteric, EOMI Ears: Normal auditory acuity Mouth: No deformity or lesions Neck: Supple, no masses or thyromegaly Lungs: Clear throughout to auscultation Heart: Regular rate and rhythm; no murmurs, rubs or bruits Abdomen: Soft, non tender and non distended. No masses, hepatosplenomegaly or hernias noted. Normal  Bowel sounds Rectal:deferred Musculoskeletal: Symmetrical with no gross deformities  Skin: No lesions on visible extremities Pulses:  Normal pulses noted Extremities: No clubbing, cyanosis, edema or deformities noted Neurological: Alert oriented x 4, grossly nonfocal Cervical Nodes:  No significant cervical adenopathy Inguinal Nodes: No significant inguinal adenopathy Psychological:  Alert and cooperative. Normal mood and affect  Imp - Symptomatic esophageal stricture Plan - repeat EGD with dilatation

## 2013-01-15 NOTE — Op Note (Signed)
[facility] [address] [place], [address]   ENDOSCOPY PROCEDURE REPORT  PATIENT: Sherry, Lambert  MR#: [id number] BIRTHDATE: March 11, 1941 , 71  yrs. old GENDER: Female ENDOSCOPIST: Louis Meckel, MD ASSISTANT:   Felecia Shelling, RN Dorisann Frames, technician REFERRED BY: PROCEDURE DATE:  01/15/2013 PROCEDURE:   EGD with balloon dilatation ASA CLASS:   Class II INDICATIONS:dilation of an esophageal stricture MEDICATIONS: These medications were titrated to patient response per physician's verbal order, Versed 5 mg IV, and Fentanyl 75 mcg IV  TOPICAL ANESTHETIC:   Cetacaine Spray  DESCRIPTION OF PROCEDURE:   After the risks benefits and alternatives of the procedure were thoroughly explained, informed consent was obtained.  The     endoscope was introduced through the mouth  and advanced to the second portion of the duodenum , The instrument was slowly withdrawn as the mucosa was carefully examined.    Again noted was a moderate stricture at the GE junction.  The 9.8 mm gastroscope traversed the stricture with mild resistance.   The remainder of the upper endoscopy exam was otherwise normal. Dilation was then performed at the gastroesphageal junction  Dilator:Balloon Size:15-16.5-18millimeter dilators were inflated for 30 seconds each.   Reststance:moderate There was a minimal amount of heme on the 18 mm dilator tip  Appearance:adequate  COMPLICATIONS: There were no complications.  ENDOSCOPIC IMPRESSION: esophagealstricture-status post balloon dilation  RECOMMENDATIONS:  repeat dilation as needed eSigned:  Louis Meckel, MD 01/15/2013 1:04 PM  CC:

## 2013-01-18 ENCOUNTER — Encounter (HOSPITAL_COMMUNITY): Payer: Self-pay | Admitting: Gastroenterology

## 2013-02-24 ENCOUNTER — Encounter: Payer: Self-pay | Admitting: Gastroenterology

## 2013-02-24 ENCOUNTER — Telehealth: Payer: Self-pay | Admitting: Gastroenterology

## 2013-02-24 NOTE — Telephone Encounter (Signed)
Pt ok for Colon to be done in the [facility]. Had one scheduled in May and had to cancel the appt. Please call and schedule pt for previsit and colon.

## 2013-02-24 NOTE — Telephone Encounter (Signed)
Scheduled

## 2013-04-03 ENCOUNTER — Other Ambulatory Visit: Payer: Self-pay | Admitting: Internal Medicine

## 2013-04-23 ENCOUNTER — Encounter: Payer: Self-pay | Admitting: Internal Medicine

## 2013-04-23 ENCOUNTER — Encounter: Payer: Self-pay | Admitting: *Deleted

## 2013-04-23 ENCOUNTER — Ambulatory Visit (INDEPENDENT_AMBULATORY_CARE_PROVIDER_SITE_OTHER): Payer: Medicare Other | Admitting: Internal Medicine

## 2013-04-23 ENCOUNTER — Other Ambulatory Visit (INDEPENDENT_AMBULATORY_CARE_PROVIDER_SITE_OTHER): Payer: Medicare Other

## 2013-04-23 VITALS — BP 124/78 | HR 80 | Temp 98.0°F | Wt 130.0 lb

## 2013-04-23 DIAGNOSIS — E119 Type 2 diabetes mellitus without complications: Secondary | ICD-10-CM

## 2013-04-23 DIAGNOSIS — I10 Essential (primary) hypertension: Secondary | ICD-10-CM

## 2013-04-23 DIAGNOSIS — J438 Other emphysema: Secondary | ICD-10-CM

## 2013-04-23 LAB — CBC
HCT: 42 % (ref 36.0–46.0)
Hemoglobin: 14 g/dL (ref 12.0–15.0)
Platelets: 290 10*3/uL (ref 150.0–400.0)
WBC: 11 10*3/uL — ABNORMAL HIGH (ref 4.5–10.5)

## 2013-04-23 LAB — COMPREHENSIVE METABOLIC PANEL
BUN: 17 mg/dL (ref 6–23)
CO2: 30 mEq/L (ref 19–32)
Calcium: 9.5 mg/dL (ref 8.4–10.5)
Chloride: 101 mEq/L (ref 96–112)
Creatinine, Ser: 0.9 mg/dL (ref 0.4–1.2)
GFR: 64.8 mL/min (ref >60.00)
Total Bilirubin: 0.4 mg/dL (ref 0.3–1.2)

## 2013-04-23 LAB — HEMOGLOBIN A1C: Hgb A1c MFr Bld: 6.5 % (ref 4.6–6.5)

## 2013-04-23 NOTE — Assessment & Plan Note (Signed)
Well controlled Will repeat CBC and CMET

## 2013-04-23 NOTE — Assessment & Plan Note (Signed)
Will recheck A1C, if elevated will start Metformin Work on diet and exercise

## 2013-04-23 NOTE — Assessment & Plan Note (Signed)
Better since starting advair Continue current meds

## 2013-04-23 NOTE — Progress Notes (Signed)
Subjective:    Patient ID: Sherry Lambert, female    DOB: 1941/08/20, 72 y.o.   MRN: [id number]  HPI  Pt presents to the clinic today for f/u of chronic medical conditions. She has been doing well on the lisinopril. No side effects that she has noticed. She has not really worked on diet and exercise but she has not lost/gained any weight. She has no complaints today.  Review of Systems      Past Medical History  Diagnosis Date  . Uterine cancer   . Emphysema of lung   . GERD (gastroesophageal reflux disease)   . Hypertension   . [name]   . Diabetes mellitus   . [name]     Current Outpatient Prescriptions  Medication Sig Dispense Refill  . ADVAIR DISKUS 100-50 MCG/DOSE AEPB INHALE ONE PUFF INTO THE LUNGS BY MOUTH TWICE DAILY  60 each  3  . Fluticasone-Salmeterol (ADVAIR) 100-50 MCG/DOSE AEPB Inhale 1 puff into the lungs 2 (two) times daily.  1 each  3  . lisinopril (PRINIVIL,ZESTRIL) 10 MG tablet Take 1 tablet (10 mg total) by mouth daily.  90 tablet  1  . omeprazole (PRILOSEC) 20 MG capsule Take 1 capsule (20 mg total) by mouth daily.  90 capsule  3   No current facility-administered medications for this visit.    No Known Allergies  Family History  Problem Relation Age of Onset  . Hypertension Father   . Diabetes Father   . Cancer Neg Hx   . Early death Neg Hx   . Stroke Neg Hx     History   Social History  . Marital Status: Divorced    Spouse Name: N/A    Number of Children: 1  . Years of Education: N/A   Occupational History  . Retired    Social History Main Topics  . Smoking status: Former Smoker -- 55 years  . Smokeless tobacco: Never Used  . Alcohol Use: No  . Drug Use: No  . Sexual Activity: No   Other Topics Concern  . Not on file   Social History Narrative   Regular exercise-no   Caffeine Use-yes     Constitutional: Denies fever, malaise, fatigue, headache or abrupt weight changes.  HEENT: Denies eye pain, eye  redness, ear pain, ringing in the ears, wax buildup, runny nose, nasal congestion, bloody nose, or sore throat. Respiratory: Denies difficulty breathing, shortness of breath, cough or sputum production.   Cardiovascular: Denies chest pain, chest tightness, palpitations or swelling in the hands or feet.  Gastrointestinal: Denies abdominal pain, bloating, constipation, diarrhea or blood in the stool.  Skin: Denies redness, rashes, lesions or ulcercations.  Neurological: Denies dizziness, difficulty with memory, difficulty with speech or problems with balance and coordination.   No other specific complaints in a complete review of systems (except as listed in HPI above).  Objective:   Physical Exam  BP 124/78  Pulse 80  Temp(Src) 98 F (36.7 C) (Oral)  Wt 130 lb (58.968 kg)  BMI 24.58 kg/m2  SpO2 98% Wt Readings from Last 3 Encounters:  04/23/13 130 lb (58.968 kg)  01/15/13 126 lb (57.153 kg)  01/15/13 126 lb (57.153 kg)    General: Appears her stated age, well developed, well nourished in NAD. HEENT: Head: normal shape and size; Eyes: sclera white, no icterus, conjunctiva pink, PERRLA and EOMs intact; Ears: Tm's gray and intact, normal light reflex; Nose: mucosa pink and moist, septum midline; Throat/Mouth: Teeth  present, mucosa pink and moist, no exudate, lesions or ulcerations noted.  Cardiovascular: Normal rate and rhythm. S1,S2 noted.  No murmur, rubs or gallops noted. No JVD or BLE edema. No carotid bruits noted. Pulmonary/Chest: Normal effort and positive vesicular breath sounds. No respiratory distress. No wheezes, rales or ronchi noted.  Neurological: Alert and oriented. Cranial nerves II-XII intact. Coordination normal. +DTRs bilaterally.    BMET    Component Value Date/Time   NA 143 11/04/2012 1145   K 4.3 11/04/2012 1145   CL 104 11/04/2012 1145   CO2 30 11/04/2012 1145   GLUCOSE 111* 11/04/2012 1145   BUN 12 11/04/2012 1145   CREATININE 0.8 11/04/2012 1145   CALCIUM 10.3  11/04/2012 1145    Lipid Panel     Component Value Date/Time   CHOL 207* 11/04/2012 1145   TRIG 71.0 11/04/2012 1145   HDL 73.70 11/04/2012 1145   CHOLHDL 3 11/04/2012 1145   VLDL 14.2 11/04/2012 1145    CBC    Component Value Date/Time   WBC 10.7* 11/04/2012 1145   RBC 5.99* 11/04/2012 1145   HGB 16.9* 11/04/2012 1145   HCT 52.9* 11/04/2012 1145   PLT 192.0 11/04/2012 1145   MCV 88.2 11/04/2012 1145   MCHC 32.0 11/04/2012 1145   RDW 13.9 11/04/2012 1145    Hgb A1C Lab Results  Component Value Date   HGBA1C 6.7* 11/04/2012         Assessment & Plan:

## 2013-04-23 NOTE — Patient Instructions (Signed)
Diabetes and Standards of Medical Care  Diabetes is complicated. You may find that your diabetes team includes a dietitian, nurse, diabetes educator, eye doctor, and more. To help everyone know what is going on and to help you get the care you deserve, the following schedule of care was developed to help keep you on track. Below are the tests, exams, vaccines, medicines, education, and plans you will need. A1c test  Performed at least 2 times a year if you are meeting treatment goals.  Performed 4 times a year if therapy has changed or if you are not meeting treatment goals. Blood pressure test  Performed at every routine medical visit. The goal is less than 120/80 mmHg. Dental exam  Follow up with the dentist regularly. Eye exam  Diagnosed with type 1 diabetes as a child: Get an exam upon reaching the age of 10 years or older and having had diabetes for 3 5 years. Yearly eye exams are recommended after that initial eye exam.  Diagnosed with type 1 diabetes as an adult: Get an exam within 5 years of diagnosis and then yearly.  Diagnosed with type 2 diabetes: Get an exam as soon as possible after the diagnosis and then yearly. Foot care exam  Visual foot exams are performed at every routine medical visit. The exams check for cuts, injuries, or other problems with the feet.  A comprehensive foot exam should be done yearly. This includes visual inspection as well as assessing foot pulses and testing for loss of sensation. Kidney function test (urine microalbumin)  Performed once a year.  Type 1 diabetes: The first test is performed 5 years after diagnosis.  Type 2 diabetes: The first test is performed at the time of diagnosis.  A serum creatinine and estimated glomerular filtration rate (eGFR) test is done once a year to tell the level of chronic kidney disease (CKD), if present. Lipid profile (Cholesterol, HDL, LDL, Triglycerides)  Performed every 5 years for most people.  The  goal for LDL is less than 100 mg/dl. If at high risk, the goal is less than 70 mg/dl.  The goal for HDL is 40 mg/dl 50 mg/dl for men and 50 mg/dl 60 mg/dl for women. An HDL cholesterol of 60 mg/dL or higher gives some protection against heart disease.  The goal for triglycerides is less than 150 mg/dl. Influenza vaccine, pneumococcal vaccine, and hepatitis B vaccine  The influenza vaccine is recommended yearly.  The pneumococcal vaccine is generally given once in a lifetime. However, there are some instances when another vaccination is recommended. Check with your caregiver.  The hepatitis B vaccine is also recommended for adults with diabetes. Diabetes self-management education  Recommended at diagnosis and ongoing as needed. Treatment plan  Reviewed at every medical visit. Document Released: 06/17/2009 Document Revised: 08/06/2012 Document Reviewed: 02/20/2011 [employer] Patient Information 2014 [employer].  

## 2013-05-07 ENCOUNTER — Encounter: Payer: Medicare Other | Admitting: Gastroenterology

## 2013-06-03 ENCOUNTER — Other Ambulatory Visit: Payer: Self-pay | Admitting: Internal Medicine

## 2013-08-18 ENCOUNTER — Ambulatory Visit: Payer: Medicare Other | Admitting: Internal Medicine

## 2013-08-24 ENCOUNTER — Other Ambulatory Visit: Payer: Self-pay

## 2013-08-31 ENCOUNTER — Other Ambulatory Visit: Payer: Self-pay | Admitting: Internal Medicine

## 2013-08-31 MED ORDER — LISINOPRIL 10 MG PO TABS: 10.0000 mg | ORAL_TABLET | Freq: Every day | ORAL | Status: DC

## 2013-08-31 MED ORDER — FLUTICASONE-SALMETEROL 100-50 MCG/DOSE IN AEPB: 1.0000 | INHALATION_SPRAY | Freq: Two times a day (BID) | RESPIRATORY_TRACT | Status: DC | PRN

## 2013-09-07 ENCOUNTER — Other Ambulatory Visit: Payer: Self-pay | Admitting: *Deleted

## 2013-09-15 ENCOUNTER — Ambulatory Visit (INDEPENDENT_AMBULATORY_CARE_PROVIDER_SITE_OTHER): Payer: Managed Care, Other (non HMO) | Admitting: Physician Assistant

## 2013-09-15 ENCOUNTER — Other Ambulatory Visit (INDEPENDENT_AMBULATORY_CARE_PROVIDER_SITE_OTHER): Payer: Managed Care, Other (non HMO)

## 2013-09-15 ENCOUNTER — Encounter: Payer: Self-pay | Admitting: Physician Assistant

## 2013-09-15 VITALS — BP 142/90 | HR 87 | Temp 98.3°F | Ht 60.0 in | Wt 135.6 lb

## 2013-09-15 DIAGNOSIS — E119 Type 2 diabetes mellitus without complications: Secondary | ICD-10-CM

## 2013-09-15 DIAGNOSIS — I10 Essential (primary) hypertension: Secondary | ICD-10-CM

## 2013-09-15 DIAGNOSIS — E785 Hyperlipidemia, unspecified: Secondary | ICD-10-CM

## 2013-09-15 DIAGNOSIS — J438 Other emphysema: Secondary | ICD-10-CM

## 2013-09-15 LAB — LIPID PANEL
CHOL/HDL RATIO: 3
Cholesterol: 205 mg/dL — ABNORMAL HIGH (ref 0–200)
HDL: 69 mg/dL (ref >39.00)
TRIGLYCERIDES: 55 mg/dL (ref 0.0–149.0)
VLDL: 11 mg/dL (ref 0.0–40.0)

## 2013-09-15 LAB — LDL CHOLESTEROL, DIRECT: LDL DIRECT: 122.4 mg/dL

## 2013-09-15 LAB — HEMOGLOBIN A1C: Hgb A1c MFr Bld: 6.7 % — ABNORMAL HIGH (ref 4.6–6.5)

## 2013-09-15 NOTE — Patient Instructions (Signed)
Nice to meet you today Sherry Lambert.  Please be certain to verify with your insurance coverage for the [place] vaccine as wel as coverage for yearly physical exam, bone density testing and colonoscopy.  Your labs have been ordered and will be reviewed.    Ingrown Toenail An ingrown toenail occurs when the sharp edge of your toenail grows into the skin. Causes of ingrown toenails include toenails clipped too far back or poorly fitting shoes. Activities involving sudden stops (basketball, tennis) causing "toe jamming" may lead to an ingrown nail. HOME CARE INSTRUCTIONS   Soak the whole foot in warm soapy water for 20 minutes, 3 times per day.  You may lift the edge of the nail away from the sore skin by wedging a small piece of cotton under the corner of the nail. Be careful not to dig (traumatize) and cause more injury to the area.  Wear shoes that fit well. While the ingrown nail is causing problems, sandals may be beneficial.  Trim your toenails regularly and carefully. Cut your toenails straight across, not in a curve. This will prevent injury to the skin at the corners of the toenail.  Keep your feet clean and dry.  Crutches may be helpful early in treatment if walking is painful.  Antibiotics, if prescribed, should be taken as directed.  Return for a wound check in 2 days or as directed.  Only take over-the-counter or prescription medicines for pain, discomfort, or fever as directed by your caregiver. SEEK IMMEDIATE MEDICAL CARE IF:   You have a fever.  You have increasing pain, redness, swelling, or heat at the wound site.  Your toe is not better in 7 days. If conservative treatment is not successful, surgical removal of a portion or all of the nail may be necessary. MAKE SURE YOU:   Understand these instructions.  Will watch your condition.  Will get help right away if you are not doing well or get worse. Document Released: 08/17/2000 Document Revised: 11/12/2011  Document Reviewed: 08/11/2008 [facility] Patient Information 2014 [employer] [place].

## 2013-09-15 NOTE — Progress Notes (Signed)
Pre-visit discussion using our clinic review tool. No additional management support is needed unless otherwise documented below in the visit note.  

## 2013-09-15 NOTE — Progress Notes (Signed)
Subjective:    Patient ID: Sherry Lambert, female    DOB: 16-Oct-1940, 73 y.o.   MRN: [id number]  HPI Comments: Patient is a 73 year old female who presents to the office today for a follow up visit for her diabetes. Patient reports she also scheduled her appointment for refills of her current medications however, she was running out and her pharmacy phoned her past provider her refill all current medications. Patient also reports mild discomfort to her left second toe. Denies other concerns at this time.      Review of Systems  Constitutional: Negative for activity change, appetite change and fatigue.  Eyes: Negative for pain and visual disturbance.  Respiratory: Negative for chest tightness and shortness of breath.   Cardiovascular: Negative for chest pain, palpitations and leg swelling.  Musculoskeletal: Negative.   Neurological: Negative for dizziness, weakness, light-headedness and numbness.  All other systems reviewed and are negative.      Lab Results  Component Value Date   WBC 11.0* 04/23/2013   HGB 14.0 04/23/2013   HCT 42.0 04/23/2013   PLT 290.0 04/23/2013   GLUCOSE 117* 04/23/2013   CHOL 207* 11/04/2012   TRIG 71.0 11/04/2012   HDL 73.70 11/04/2012   LDLDIRECT 116.0 11/04/2012   ALT 18 04/23/2013   AST 19 04/23/2013   NA 138 04/23/2013   K 4.3 04/23/2013   CL 101 04/23/2013   CREATININE 0.9 04/23/2013   BUN 17 04/23/2013   CO2 30 04/23/2013   TSH 1.28 11/04/2012   HGBA1C 6.5 04/23/2013   Past Medical History  Diagnosis Date  . Uterine cancer   . Emphysema of lung   . GERD (gastroesophageal reflux disease)   . Hypertension   . [name]   . Diabetes mellitus   . [name]      Objective:   Physical Exam  Vitals reviewed. Constitutional: She is oriented to person, place, and time. She appears well-developed and well-nourished. No distress.  HENT:  Head: Normocephalic and atraumatic.  Eyes: Conjunctivae are normal. Pupils are equal, round, and reactive to  light.  Neck: Normal range of motion.  Cardiovascular: Normal rate and regular rhythm.  Exam reveals no gallop and no friction rub.   No murmur heard. Pulses:      Dorsalis pedis pulses are 2+ on the right side, and 2+ on the left side.       Posterior tibial pulses are 2+ on the right side, and 2+ on the left side.  Pulmonary/Chest: Effort normal and breath sounds normal.  Abdominal: Soft. Bowel sounds are normal. She exhibits no distension. There is no tenderness.  Musculoskeletal: Normal range of motion.  Left second digit with mild erythema and edema noted to distal end and pad. No ulcerations, fluctuance or tactile heat noted.  Diabetic foot exam complete.  Neurological: She is alert and oriented to person, place, and time.  Skin: Skin is warm and dry.  Psychiatric: She has a normal mood and affect.      Assessment & Plan:   1. Diabetes  Diet controlled, last HgA1c at 6.5   Diabetic foot exam done today.   Referral to podiatry  HgA1c ordered  Patient questioned if ingrown toe nail, I explained did not appear ingrown at this time, discussed using well fitted shoes, provided  patient education for ingrown toe nail.  2. HTN  Rx refill phoned in by past provider, continue medications as directed  3. Dyslipidemia   Total cholesterol  elevated on past lab.  Lipid panel ordered. Will evaluate for need to initiate statin.  4. Emphysema   Rx refill phoned in by past provider, continue medication as directed  No PFT's noted in chart  Referral to Pulmonlogy   Discussed with patient scheduling appointment for physical exam. She wants to defer and verify with her current insurance what will be covered. She is looking into coverage for Dexa, colonoscopy and Zoster vaccine.

## 2013-09-19 DIAGNOSIS — E1169 Type 2 diabetes mellitus with other specified complication: Secondary | ICD-10-CM | POA: Insufficient documentation

## 2013-09-19 DIAGNOSIS — E785 Hyperlipidemia, unspecified: Secondary | ICD-10-CM

## 2013-09-19 NOTE — Assessment & Plan Note (Signed)
  Rx refill phoned in by past provider, continue medications as directed

## 2013-09-19 NOTE — Assessment & Plan Note (Addendum)
   Diet controlled, last HgA1c at 6.5   Diabetic foot exam done today.   Referral to podiatry for swelling to digit  HgA1c ordered  Patient questioned if ingrown toe nail, I explained did not appear ingrown at this time, discussed using well fitted shoes, provided patient education for ingrown toe nail.

## 2013-09-19 NOTE — Assessment & Plan Note (Signed)
   Total cholesterol elevated on past lab.  Lipid panel ordered. Will evaluate for need to initiate statin.

## 2013-09-19 NOTE — Assessment & Plan Note (Signed)
   Rx refill phoned in by past provider, continue medication as directed  No PFT's noted in chart  Referral to Pulmonlogy

## 2013-10-06 ENCOUNTER — Encounter: Payer: Managed Care, Other (non HMO) | Admitting: Physician Assistant

## 2013-10-21 ENCOUNTER — Telehealth (HOSPITAL_COMMUNITY): Payer: Self-pay | Admitting: *Deleted

## 2013-10-22 ENCOUNTER — Encounter: Payer: Managed Care, Other (non HMO) | Admitting: Physician Assistant

## 2013-10-23 ENCOUNTER — Other Ambulatory Visit (HOSPITAL_COMMUNITY): Payer: Self-pay | Admitting: Foot & Ankle Surgery

## 2013-10-23 DIAGNOSIS — Z87891 Personal history of nicotine dependence: Secondary | ICD-10-CM

## 2013-10-27 ENCOUNTER — Encounter (HOSPITAL_COMMUNITY): Payer: Managed Care, Other (non HMO)

## 2013-11-03 ENCOUNTER — Ambulatory Visit (HOSPITAL_COMMUNITY)
Admission: RE | Admit: 2013-11-03 | Discharge: 2013-11-03 | Disposition: A | Discharge status: Home / Self Care | Payer: Managed Care, Other (non HMO) | Source: Ambulatory Visit | Attending: Foot & Ankle Surgery | Admitting: Foot & Ankle Surgery

## 2013-11-03 DIAGNOSIS — R230 Cyanosis: Secondary | ICD-10-CM | POA: Insufficient documentation

## 2013-11-03 DIAGNOSIS — I10 Essential (primary) hypertension: Secondary | ICD-10-CM | POA: Insufficient documentation

## 2013-11-03 DIAGNOSIS — I743 Embolism and thrombosis of arteries of the lower extremities: Secondary | ICD-10-CM

## 2013-11-03 DIAGNOSIS — I739 Peripheral vascular disease, unspecified: Secondary | ICD-10-CM | POA: Insufficient documentation

## 2013-11-03 DIAGNOSIS — M899 Disorder of bone, unspecified: Secondary | ICD-10-CM | POA: Insufficient documentation

## 2013-11-03 DIAGNOSIS — Z87891 Personal history of nicotine dependence: Secondary | ICD-10-CM | POA: Insufficient documentation

## 2013-11-03 DIAGNOSIS — J438 Other emphysema: Secondary | ICD-10-CM | POA: Insufficient documentation

## 2013-11-03 DIAGNOSIS — E785 Hyperlipidemia, unspecified: Secondary | ICD-10-CM | POA: Insufficient documentation

## 2013-11-03 DIAGNOSIS — R1310 Dysphagia, unspecified: Secondary | ICD-10-CM | POA: Insufficient documentation

## 2013-11-03 DIAGNOSIS — E119 Type 2 diabetes mellitus without complications: Secondary | ICD-10-CM | POA: Insufficient documentation

## 2013-11-03 DIAGNOSIS — R9389 Abnormal findings on diagnostic imaging of other specified body structures: Secondary | ICD-10-CM | POA: Insufficient documentation

## 2013-11-03 DIAGNOSIS — M949 Disorder of cartilage, unspecified: Secondary | ICD-10-CM

## 2013-11-03 NOTE — Progress Notes (Signed)
Arterial Duplex Lower Ext. Completed. Sherry Lambert, BS, RDMS, RVT  

## 2013-11-12 ENCOUNTER — Ambulatory Visit (INDEPENDENT_AMBULATORY_CARE_PROVIDER_SITE_OTHER): Payer: Managed Care, Other (non HMO) | Admitting: Physician Assistant

## 2013-11-12 ENCOUNTER — Encounter: Payer: Self-pay | Admitting: Physician Assistant

## 2013-11-12 ENCOUNTER — Ambulatory Visit: Payer: Managed Care, Other (non HMO)

## 2013-11-12 ENCOUNTER — Other Ambulatory Visit (INDEPENDENT_AMBULATORY_CARE_PROVIDER_SITE_OTHER): Payer: Managed Care, Other (non HMO)

## 2013-11-12 VITALS — BP 130/88 | HR 85 | Temp 98.0°F | Ht 60.0 in | Wt 136.0 lb

## 2013-11-12 DIAGNOSIS — M899 Disorder of bone, unspecified: Secondary | ICD-10-CM

## 2013-11-12 DIAGNOSIS — M8580 Other specified disorders of bone density and structure, unspecified site: Secondary | ICD-10-CM

## 2013-11-12 DIAGNOSIS — Z0000 Encounter for general adult medical examination without abnormal findings: Secondary | ICD-10-CM

## 2013-11-12 DIAGNOSIS — Z1231 Encounter for screening mammogram for malignant neoplasm of breast: Secondary | ICD-10-CM

## 2013-11-12 DIAGNOSIS — I10 Essential (primary) hypertension: Secondary | ICD-10-CM

## 2013-11-12 DIAGNOSIS — M949 Disorder of cartilage, unspecified: Secondary | ICD-10-CM

## 2013-11-12 DIAGNOSIS — Z1211 Encounter for screening for malignant neoplasm of colon: Secondary | ICD-10-CM

## 2013-11-12 DIAGNOSIS — J438 Other emphysema: Secondary | ICD-10-CM

## 2013-11-12 LAB — BASIC METABOLIC PANEL
BUN: 11 mg/dL (ref 6–23)
CO2: 31 meq/L (ref 19–32)
Calcium: 10.4 mg/dL (ref 8.4–10.5)
Chloride: 104 mEq/L (ref 96–112)
Creatinine, Ser: 0.8 mg/dL (ref 0.4–1.2)
GFR: 75.06 mL/min (ref >60.00)
GLUCOSE: 125 mg/dL — AB (ref 70–99)
POTASSIUM: 4.5 meq/L (ref 3.5–5.1)
SODIUM: 140 meq/L (ref 135–145)

## 2013-11-12 LAB — CBC WITH DIFFERENTIAL/PLATELET
BASOS ABS: 0 10*3/uL (ref 0.0–0.1)
Basophils Relative: 0.3 % (ref 0.0–3.0)
EOS PCT: 1 % (ref 0.0–5.0)
Eosinophils Absolute: 0.1 10*3/uL (ref 0.0–0.7)
HCT: 45 % (ref 36.0–46.0)
Hemoglobin: 14.7 g/dL (ref 12.0–15.0)
Lymphocytes Relative: 22.3 % (ref 12.0–46.0)
Lymphs Abs: 2.2 10*3/uL (ref 0.7–4.0)
MCHC: 32.7 g/dL (ref 30.0–36.0)
MCV: 86.3 fl (ref 78.0–100.0)
MONOS PCT: 6.4 % (ref 3.0–12.0)
Monocytes Absolute: 0.6 10*3/uL (ref 0.1–1.0)
NEUTROS PCT: 70 % (ref 43.0–77.0)
Neutro Abs: 6.8 10*3/uL (ref 1.4–7.7)
PLATELETS: 274 10*3/uL (ref 150.0–400.0)
RBC: 5.21 Mil/uL — ABNORMAL HIGH (ref 3.87–5.11)
RDW: 14.6 % (ref 11.5–14.6)
WBC: 9.7 10*3/uL (ref 4.5–10.5)

## 2013-11-12 LAB — URINALYSIS, ROUTINE W REFLEX MICROSCOPIC
BILIRUBIN URINE: NEGATIVE
KETONES UR: NEGATIVE
Nitrite: NEGATIVE
PH: 6 (ref 5.0–8.0)
SPECIFIC GRAVITY, URINE: 1.02 (ref 1.000–1.030)
Total Protein, Urine: NEGATIVE
URINE GLUCOSE: NEGATIVE
Urobilinogen, UA: 0.2 (ref 0.0–1.0)

## 2013-11-12 LAB — HEPATIC FUNCTION PANEL
ALT: 18 U/L (ref 0–35)
AST: 20 U/L (ref 0–37)
Albumin: 4 g/dL (ref 3.5–5.2)
Alkaline Phosphatase: 81 U/L (ref 39–117)
BILIRUBIN DIRECT: 0.1 mg/dL (ref 0.0–0.3)
BILIRUBIN TOTAL: 0.8 mg/dL (ref 0.3–1.2)
Total Protein: 7.2 g/dL (ref 6.0–8.3)

## 2013-11-12 LAB — HEMOGLOBIN A1C: HEMOGLOBIN A1C: 6.7 % — AB (ref 4.6–6.5)

## 2013-11-12 NOTE — Progress Notes (Signed)
Pre visit review using our clinic review tool, if applicable. No additional management support is needed unless otherwise documented below in the visit note. 

## 2013-11-12 NOTE — Progress Notes (Signed)
Add request has been sent as requested.

## 2013-11-12 NOTE — Patient Instructions (Signed)
Great to see you again [name]!  I have place an order for labs, please report to the lab in the lower level.  I have placed a number of referrals for you as well. The patient care coordinator will phone you to help schedule these appointments.

## 2013-11-12 NOTE — Progress Notes (Signed)
Patient ID: Sherry Lambert is a 73 y.o. female DOB: [phone] MRN: [id number]     HPI:  Patient here for wellness exam. Reports had to put one of her dogs to sleep yesterday, is tearful. When last seen here she was experiencing a painful ingrown toenail, referred to podiatry for evaluation, had an xray of foot and told had osteopenia. Patient has never had Dexa scan. Is taking medications as prescribed. Has no concerns at this time. Denies chest pain/palpitations, new SOB, cough, N/V/F, numbness, weakness, change in bowel/bladder habits, extremity swelling, falls or depression.  Influenza: 11/14 Pneumonia: 1/12 Tetanus: 1/14 PAP: hysterectomy Mammogram: never Dexa: never Eye Dr.- due for yearly exam, is scheduling Dentist yearly Colonoscopy: over 25 years ago   ROS: As stated in HPI. All other systems negative  Past Medical History  Diagnosis Date  . Uterine cancer   . Emphysema of lung   . GERD (gastroesophageal reflux disease)   . Hypertension   . [name]   . Diabetes mellitus   . [name]    Family History  Problem Relation Age of Onset  . Hypertension Father   . Diabetes Father   . Cancer Neg Hx   . Early death Neg Hx   . Stroke Neg Hx    History   Social History  . Marital Status: Divorced    Spouse Name: N/A    Number of Children: 1  . Years of Education: N/A   Occupational History  . Retired    Social History Main Topics  . Smoking status: Former Smoker -- 104 years  . Smokeless tobacco: Never Used  . Alcohol Use: No  . Drug Use: No  . Sexual Activity: No   Other Topics Concern  . None   Social History Narrative   Regular exercise-no   Caffeine Use-yes   Past Surgical History  Procedure Laterality Date  . Tonsillectomy    . Abdominal hysterectomy    . Colonoscopy    . Esophagogastroduodenoscopy N/A 12/23/2012    Procedure: ESOPHAGOGASTRODUODENOSCOPY (EGD);  Surgeon: Inda Castle, MD;  Location: [facility];  Service:  Endoscopy;  Laterality: N/A;  . Balloon dilation N/A 12/23/2012    Procedure: BALLOON DILATION;  Surgeon: Inda Castle, MD;  Location: [facility];  Service: Endoscopy;  Laterality: N/A;  . Esophagogastroduodenoscopy N/A 01/15/2013    Procedure: ESOPHAGOGASTRODUODENOSCOPY (EGD);  Surgeon: Inda Castle, MD;  Location: [facility];  Service: Endoscopy;  Laterality: N/A;  . Balloon dilation N/A 01/15/2013    Procedure: BALLOON DILATION;  Surgeon: Inda Castle, MD;  Location: [facility];  Service: Endoscopy;  Laterality: N/A;   Current Outpatient Prescriptions on File Prior to Visit  Medication Sig Dispense Refill  . Fluticasone-Salmeterol (ADVAIR) 100-50 MCG/DOSE AEPB Inhale 1 puff into the lungs 2 (two) times daily.  1 each  3  . lisinopril (PRINIVIL,ZESTRIL) 10 MG tablet Take 1 tablet (10 mg total) by mouth daily.  90 tablet  0  . omeprazole (PRILOSEC) 20 MG capsule Take 1 capsule (20 mg total) by mouth daily.  90 capsule  3   No current facility-administered medications on file prior to visit.   No Known Allergies  PE:  Filed Vitals:   11/12/13 0903  BP: 130/88  Pulse: 85  Temp: 98 F (36.7 C)    CONSTITUTIONAL: Well developed, well nourished, pleasant, appears stated age, in NAD HEENT: normocephalic, atraumatic, bilateral ext/int canals normal. Bilateral TM's without injections, bulging, erythema. Nose normal,  uvula midline, oropharynx clear and moist. Upper denture in place, lower partial. Gingiva without erythema or edema.  EYES: PERRLA, bilateral EOM and conjunctiva normal, no icterus NECK: FROM, supple, without thyromegaly or mass, No carotid bruits CARDIO: RRR, normal S1 and S2, distal pulses intact. PULM/CHEST CTA bilateral, no wheezes, rales or rhonchi. Non tender. ABD: appearance normal, soft, nontender. Normal bowel sounds x 4 quadrants GU: deferred. MUSC: FROM U/LE bilateral LYMPH: no cervical, supraclavicular adenopathy NEURO: alert and oriented x 3, no  cranial nerve deficit, motor strength and coordination NL. DTR's intact.  Negative romberg. Gait normal. SKIN: warm, dry, no rash or lesions noted. PSYCH: Mood and affect normal, speech normal.  Lab Results  Component Value Date   WBC 9.7 11/12/2013   HGB 14.7 11/12/2013   HCT 45.0 11/12/2013   PLT 274.0 11/12/2013   GLUCOSE 125* 11/12/2013   CHOL 205* 09/15/2013   TRIG 55.0 09/15/2013   HDL 69.00 09/15/2013   LDLDIRECT 122.4 09/15/2013   ALT 18 11/12/2013   AST 20 11/12/2013   NA 140 11/12/2013   K 4.5 11/12/2013   CL 104 11/12/2013   CREATININE 0.8 11/12/2013   BUN 11 11/12/2013   CO2 31 11/12/2013   TSH 1.28 11/04/2012   HGBA1C 6.7* 09/15/2013     ASSESSMENT and PLAN   CPX/v70.0 - Patient has been counseled on age-appropriate routine health concerns for screening and prevention. These are reviewed and up-to-date. Immunizations are up-to-date or declined. Labs ordered and will be reviewed.  HTN: Well controlled on Lisinopril 10 mg daily  Emphysema:  Well controlled with Advair, 1 puff twice daily  Osteopenia: dexa ordered today  Dysphagia Well controlled with Omeprazole.

## 2013-11-18 ENCOUNTER — Other Ambulatory Visit: Payer: Self-pay | Admitting: Physician Assistant

## 2013-11-18 DIAGNOSIS — Z1231 Encounter for screening mammogram for malignant neoplasm of breast: Secondary | ICD-10-CM

## 2013-11-20 ENCOUNTER — Ambulatory Visit
Admission: RE | Admit: 2013-11-20 | Discharge: 2013-11-20 | Disposition: A | Discharge status: Home / Self Care | Payer: Medicare HMO | Source: Ambulatory Visit | Attending: Physician Assistant | Admitting: Physician Assistant

## 2013-11-20 DIAGNOSIS — Z1231 Encounter for screening mammogram for malignant neoplasm of breast: Secondary | ICD-10-CM

## 2013-12-03 ENCOUNTER — Other Ambulatory Visit: Payer: Self-pay | Admitting: *Deleted

## 2013-12-03 ENCOUNTER — Telehealth: Payer: Self-pay | Admitting: *Deleted

## 2013-12-03 MED ORDER — LISINOPRIL 10 MG PO TABS: 10.0000 mg | ORAL_TABLET | Freq: Every day | ORAL | Status: DC

## 2013-12-03 NOTE — Telephone Encounter (Signed)
Her labs look good.

## 2013-12-03 NOTE — Telephone Encounter (Signed)
Left message on VM of lab results.

## 2013-12-03 NOTE — Telephone Encounter (Signed)
Pt called requesting labs results from 3.12.15.  Please advise

## 2013-12-08 ENCOUNTER — Other Ambulatory Visit: Payer: Self-pay

## 2013-12-14 ENCOUNTER — Encounter: Payer: Self-pay | Admitting: Physician Assistant

## 2013-12-24 ENCOUNTER — Encounter: Payer: Self-pay | Admitting: Obstetrics and Gynecology

## 2014-01-05 ENCOUNTER — Ambulatory Visit (INDEPENDENT_AMBULATORY_CARE_PROVIDER_SITE_OTHER): Payer: Medicare HMO | Admitting: Cardiovascular Disease

## 2014-01-05 ENCOUNTER — Encounter: Payer: Self-pay | Admitting: Cardiovascular Disease

## 2014-01-05 VITALS — BP 140/90 | HR 88 | Ht 60.0 in | Wt 136.0 lb

## 2014-01-05 DIAGNOSIS — I739 Peripheral vascular disease, unspecified: Secondary | ICD-10-CM

## 2014-01-05 DIAGNOSIS — I10 Essential (primary) hypertension: Secondary | ICD-10-CM

## 2014-01-05 DIAGNOSIS — E785 Hyperlipidemia, unspecified: Secondary | ICD-10-CM

## 2014-01-05 NOTE — Patient Instructions (Signed)
Follow up Dr Gwenlyn Found as needed.

## 2014-01-05 NOTE — Assessment & Plan Note (Signed)
Patient was referred to me by Dr. Melony Overly at his podiatry for discoloration of her right and left second toes. She denies claudication. Her cardiovascular risk factor profile is remarkable for hypertension, discontinued tobacco abuse and mild hyperlipidemia. She had arterial Dopplers performed in the office 11/03/13 which revealed normal ABIs but decreased ABIs. There were no obvious  Blockages in any of her major vessels.she does have mild purplish discoloration of her second toes bilaterally. I suspect this is related to "small vessel disease" and requires no further therapy or workup.

## 2014-01-05 NOTE — Progress Notes (Signed)
01/05/2014 Sherry Lambert   Dec 31, 1940  [id number]  Primary Physician Stacy Gardner, PA-C Primary Cardiologist: Lorretta Harp MD Renae Gloss   HPI:  Sherry Lambert is a very pleasant 73 year old mildly overweight divorced Caucasian female mother of one child, grandmother of one grandchild who is referred by Dr. Melony Overly from [facility] podiatry for peripheral vascular evaluation. 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 Her cardiovascular risk factor profile is remarkable for 40-60 pack years of tobacco abuse having quit 10 years ago. She is to hypertension and mild hyperlipidemia. There is no family history of heart disease. She denies chest pain or shortness of breath. She denies claudication. She was noted by Dr. Melony Overly has with discoloration of both lower extremities second toes. Lower extremity arterial Doppler studies performed in office revealed normal ABIs bilaterally without evidence of artificial obstructive disease with decreased TBIs suggesting "small vessel disease".   Current Outpatient Prescriptions  Medication Sig Dispense Refill  . aspirin 81 MG tablet Take 81 mg by mouth daily.      . Fluticasone-Salmeterol (ADVAIR) 100-50 MCG/DOSE AEPB Inhale 1 puff into the lungs 2 (two) times daily.  1 each  3  . lisinopril (PRINIVIL,ZESTRIL) 10 MG tablet Take 1 tablet (10 mg total) by mouth daily.  90 tablet  3   No current facility-administered medications for this visit.    No Known Allergies  History   Social History  . Marital Status: Divorced    Spouse Name: N/A    Number of Children: 1  . Years of Education: N/A   Occupational History  . Retired    Social History Main Topics  . Smoking status: Former Smoker -- 15 years  . Smokeless tobacco: Never Used  . Alcohol Use: No  . Drug Use: No  . Sexual Activity: No   Other Topics Concern  . Not on file   Social History Narrative   Regular exercise-no   Caffeine Use-yes     Review of  Systems: General: negative for chills, fever, night sweats or weight changes.  Cardiovascular: negative for chest pain, dyspnea on exertion, edema, orthopnea, palpitations, paroxysmal nocturnal dyspnea or shortness of breath Dermatological: negative for rash Respiratory: negative for cough or wheezing Urologic: negative for hematuria Abdominal: negative for nausea, vomiting, diarrhea, bright red blood per rectum, melena, or hematemesis Neurologic: negative for visual changes, syncope, or dizziness All other systems reviewed and are otherwise negative except as noted above.    Blood pressure 140/90, pulse 88, height 5' (1.524 m), weight 136 lb (61.689 kg).  General appearance: alert and no distress Neck: no adenopathy, no carotid bruit, no JVD, supple, symmetrical, trachea midline and thyroid not enlarged, symmetric, no tenderness/mass/nodules Lungs: clear to auscultation bilaterally Heart: regular rate and rhythm, S1, S2 normal, no murmur, click, rub or gallop Extremities: extremities normal, atraumatic, no cyanosis or edema and 2+ pedal pulses bilaterally  EKG not performed today  ASSESSMENT AND PLAN:   HTN (hypertension) Controlled on current medications  Dyslipidemia Not on statin therapy. Followed by her PCP.  Peripheral arterial disease Patient was referred to me by Dr. Melony Overly at his podiatry for discoloration of her right and left second toes. She denies claudication. Her cardiovascular risk factor profile is remarkable for hypertension, discontinued tobacco abuse and mild hyperlipidemia. She had arterial Dopplers performed in the office 11/03/13 which revealed normal ABIs but decreased ABIs. There were no obvious  Blockages in any of her major vessels.she does have mild  purplish discoloration of her second toes bilaterally. I suspect this is related to "small vessel disease" and requires no further therapy or workup.      Lorretta Harp MD FACP,FACC,FAHA,  [facility] 01/05/2014 12:16 PM

## 2014-01-05 NOTE — Assessment & Plan Note (Signed)
Controlled on current medications 

## 2014-01-05 NOTE — Assessment & Plan Note (Signed)
Not on statin therapy. Followed by her PCP 

## 2014-01-19 ENCOUNTER — Other Ambulatory Visit: Payer: Self-pay | Admitting: *Deleted

## 2014-01-19 DIAGNOSIS — J438 Other emphysema: Secondary | ICD-10-CM

## 2014-01-19 MED ORDER — FLUTICASONE-SALMETEROL 100-50 MCG/DOSE IN AEPB: 1.0000 | INHALATION_SPRAY | Freq: Two times a day (BID) | RESPIRATORY_TRACT | Status: DC

## 2014-01-19 NOTE — Telephone Encounter (Signed)
Received faxed refill request from pharmacy. Last refill 12/09/12 #1/3 refills, last office visit 09/15/13 with Alfonzo Beers PA-C. Is it okay to refill?

## 2014-01-27 ENCOUNTER — Telehealth: Payer: Self-pay | Admitting: *Deleted

## 2014-01-27 NOTE — Telephone Encounter (Signed)
Pt called requesting lab results from March 2015.  Former pt of [facility].  Please advise

## 2014-01-27 NOTE — Telephone Encounter (Signed)
Thank you for message All labs okay, sugar with borderline diabetes, but no need for medication changes May send results to patient if she desires thanks

## 2014-01-28 NOTE — Telephone Encounter (Signed)
Pt requested a print out of labs.  Labs mailed as requested.  Pt did not want message from MD

## 2014-02-02 ENCOUNTER — Other Ambulatory Visit: Payer: Self-pay

## 2014-02-02 NOTE — Telephone Encounter (Signed)
Rx has already been filled. 

## 2014-02-03 ENCOUNTER — Encounter: Payer: Medicare HMO | Admitting: Obstetrics and Gynecology

## 2014-05-27 ENCOUNTER — Ambulatory Visit: Payer: Medicare HMO | Admitting: Internal Medicine

## 2014-07-28 ENCOUNTER — Inpatient Hospital Stay (HOSPITAL_COMMUNITY): Payer: Medicare HMO

## 2014-07-28 ENCOUNTER — Encounter (HOSPITAL_COMMUNITY): Payer: Self-pay | Admitting: Emergency Medicine

## 2014-07-28 ENCOUNTER — Inpatient Hospital Stay (HOSPITAL_COMMUNITY)
Admission: EM | Admit: 2014-07-28 | Discharge: 2014-08-03 | DRG: 064 | Disposition: A | Discharge status: Rehabilitation Facility | Payer: Medicare HMO | Attending: Neurology | Admitting: Neurology

## 2014-07-28 ENCOUNTER — Emergency Department (HOSPITAL_COMMUNITY): Payer: Medicare HMO

## 2014-07-28 DIAGNOSIS — I160 Hypertensive urgency: Secondary | ICD-10-CM | POA: Diagnosis present

## 2014-07-28 DIAGNOSIS — J449 Chronic obstructive pulmonary disease, unspecified: Secondary | ICD-10-CM | POA: Diagnosis present

## 2014-07-28 DIAGNOSIS — I739 Peripheral vascular disease, unspecified: Secondary | ICD-10-CM | POA: Diagnosis present

## 2014-07-28 DIAGNOSIS — I672 Cerebral atherosclerosis: Secondary | ICD-10-CM | POA: Diagnosis present

## 2014-07-28 DIAGNOSIS — Z833 Family history of diabetes mellitus: Secondary | ICD-10-CM

## 2014-07-28 DIAGNOSIS — Z79899 Other long term (current) drug therapy: Secondary | ICD-10-CM

## 2014-07-28 DIAGNOSIS — Z8542 Personal history of malignant neoplasm of other parts of uterus: Secondary | ICD-10-CM | POA: Diagnosis not present

## 2014-07-28 DIAGNOSIS — Z90710 Acquired absence of both cervix and uterus: Secondary | ICD-10-CM | POA: Diagnosis not present

## 2014-07-28 DIAGNOSIS — I10 Essential (primary) hypertension: Secondary | ICD-10-CM | POA: Diagnosis present

## 2014-07-28 DIAGNOSIS — G8191 Hemiplegia, unspecified affecting right dominant side: Secondary | ICD-10-CM | POA: Diagnosis present

## 2014-07-28 DIAGNOSIS — Z7982 Long term (current) use of aspirin: Secondary | ICD-10-CM

## 2014-07-28 DIAGNOSIS — I619 Nontraumatic intracerebral hemorrhage, unspecified: Secondary | ICD-10-CM

## 2014-07-28 DIAGNOSIS — Z87891 Personal history of nicotine dependence: Secondary | ICD-10-CM | POA: Diagnosis not present

## 2014-07-28 DIAGNOSIS — I615 Nontraumatic intracerebral hemorrhage, intraventricular: Secondary | ICD-10-CM | POA: Diagnosis not present

## 2014-07-28 DIAGNOSIS — I610 Nontraumatic intracerebral hemorrhage in hemisphere, subcortical: Secondary | ICD-10-CM

## 2014-07-28 DIAGNOSIS — G936 Cerebral edema: Secondary | ICD-10-CM | POA: Diagnosis present

## 2014-07-28 DIAGNOSIS — B9620 Unspecified Escherichia coli [E. coli] as the cause of diseases classified elsewhere: Secondary | ICD-10-CM | POA: Diagnosis present

## 2014-07-28 DIAGNOSIS — E118 Type 2 diabetes mellitus with unspecified complications: Secondary | ICD-10-CM

## 2014-07-28 DIAGNOSIS — Z8249 Family history of ischemic heart disease and other diseases of the circulatory system: Secondary | ICD-10-CM | POA: Diagnosis not present

## 2014-07-28 DIAGNOSIS — E119 Type 2 diabetes mellitus without complications: Secondary | ICD-10-CM

## 2014-07-28 DIAGNOSIS — N390 Urinary tract infection, site not specified: Secondary | ICD-10-CM | POA: Diagnosis present

## 2014-07-28 DIAGNOSIS — I6523 Occlusion and stenosis of bilateral carotid arteries: Secondary | ICD-10-CM | POA: Diagnosis present

## 2014-07-28 HISTORY — DX: Type 2 diabetes mellitus without complications: E11.9

## 2014-07-28 LAB — COMPREHENSIVE METABOLIC PANEL
ALT: 15 U/L (ref 0–35)
ANION GAP: 13 (ref 5–15)
AST: 18 U/L (ref 0–37)
Albumin: 3.9 g/dL (ref 3.5–5.2)
Alkaline Phosphatase: 90 U/L (ref 39–117)
BUN: 13 mg/dL (ref 6–23)
CALCIUM: 9.5 mg/dL (ref 8.4–10.5)
CO2: 25 meq/L (ref 19–32)
CREATININE: 0.87 mg/dL (ref 0.50–1.10)
Chloride: 104 mEq/L (ref 96–112)
GFR calc Af Amer: 75 mL/min — ABNORMAL LOW (ref >90)
GFR, EST NON AFRICAN AMERICAN: 65 mL/min — AB (ref >90)
GLUCOSE: 112 mg/dL — AB (ref 70–99)
Potassium: 4 mEq/L (ref 3.7–5.3)
Sodium: 142 mEq/L (ref 137–147)
Total Bilirubin: <0.2 mg/dL — ABNORMAL LOW (ref 0.3–1.2)
Total Protein: 7.4 g/dL (ref 6.0–8.3)

## 2014-07-28 LAB — PROTIME-INR
INR: 1.01 (ref 0.00–1.49)
Prothrombin Time: 13.4 seconds (ref 11.6–15.2)

## 2014-07-28 LAB — CBC
HEMATOCRIT: 45.8 % (ref 36.0–46.0)
Hemoglobin: 14.7 g/dL (ref 12.0–15.0)
MCH: 29.4 pg (ref 26.0–34.0)
MCHC: 32.1 g/dL (ref 30.0–36.0)
MCV: 91.6 fL (ref 78.0–100.0)
Platelets: 272 10*3/uL (ref 150–400)
RBC: 5 MIL/uL (ref 3.87–5.11)
RDW: 13.6 % (ref 11.5–15.5)
WBC: 10.2 10*3/uL (ref 4.0–10.5)

## 2014-07-28 LAB — DIFFERENTIAL
Basophils Absolute: 0 10*3/uL (ref 0.0–0.1)
Basophils Relative: 0 % (ref 0–1)
EOS ABS: 0.1 10*3/uL (ref 0.0–0.7)
EOS PCT: 1 % (ref 0–5)
LYMPHS ABS: 3.3 10*3/uL (ref 0.7–4.0)
Lymphocytes Relative: 32 % (ref 12–46)
MONO ABS: 0.6 10*3/uL (ref 0.1–1.0)
Monocytes Relative: 6 % (ref 3–12)
Neutro Abs: 6.2 10*3/uL (ref 1.7–7.7)
Neutrophils Relative %: 61 % (ref 43–77)

## 2014-07-28 LAB — I-STAT CHEM 8, ED
BUN: 14 mg/dL (ref 6–23)
CHLORIDE: 104 meq/L (ref 96–112)
Calcium, Ion: 1.23 mmol/L (ref 1.13–1.30)
Creatinine, Ser: 0.9 mg/dL (ref 0.50–1.10)
GLUCOSE: 113 mg/dL — AB (ref 70–99)
HCT: 48 % — ABNORMAL HIGH (ref 36.0–46.0)
HEMOGLOBIN: 16.3 g/dL — AB (ref 12.0–15.0)
Potassium: 3.8 mEq/L (ref 3.7–5.3)
Sodium: 143 mEq/L (ref 137–147)
TCO2: 24 mmol/L (ref 0–100)

## 2014-07-28 LAB — APTT: aPTT: 30 seconds (ref 24–37)

## 2014-07-28 LAB — I-STAT TROPONIN, ED: TROPONIN I, POC: 0 ng/mL (ref 0.00–0.08)

## 2014-07-28 LAB — ETHANOL: Alcohol, Ethyl (B): <11 mg/dL (ref 0–11)

## 2014-07-28 MED ORDER — SODIUM CHLORIDE 0.9 % IV SOLN
INTRAVENOUS | Status: DC
  Administered 2014-07-28: via INTRAVENOUS
  Administered 2014-07-31 (×2): 1000 mL via INTRAVENOUS

## 2014-07-28 MED ORDER — SENNOSIDES-DOCUSATE SODIUM 8.6-50 MG PO TABS
1.0000 | ORAL_TABLET | Freq: Two times a day (BID) | ORAL | Status: DC
  Administered 2014-07-29 – 2014-07-30 (×2): 1 via ORAL
  Filled 2014-07-28 (×7): qty 1

## 2014-07-28 MED ORDER — LABETALOL HCL 5 MG/ML IV SOLN
10.0000 mg | INTRAVENOUS | Status: DC | PRN
  Administered 2014-07-29: 20 mg via INTRAVENOUS
  Administered 2014-07-30: 10 mg via INTRAVENOUS
  Filled 2014-07-28 (×2): qty 4

## 2014-07-28 MED ORDER — STROKE: EARLY STAGES OF RECOVERY BOOK
Freq: Once | Status: AC
  Administered 2014-07-28
  Filled 2014-07-28: qty 1

## 2014-07-28 MED ORDER — ACETAMINOPHEN 650 MG RE SUPP: 650.0000 mg | RECTAL | Status: DC | PRN

## 2014-07-28 MED ORDER — PANTOPRAZOLE SODIUM 40 MG IV SOLR
40.0000 mg | Freq: Every day | INTRAVENOUS | Status: DC
  Administered 2014-07-28: 40 mg via INTRAVENOUS
  Filled 2014-07-28 (×2): qty 40

## 2014-07-28 MED ORDER — LABETALOL HCL 5 MG/ML IV SOLN
10.0000 mg | Freq: Once | INTRAVENOUS | Status: AC
  Administered 2014-07-28: 10 mg via INTRAVENOUS
  Filled 2014-07-28: qty 4

## 2014-07-28 MED ORDER — ACETAMINOPHEN 325 MG PO TABS
650.0000 mg | ORAL_TABLET | ORAL | Status: DC | PRN
  Administered 2014-08-01: 650 mg via ORAL
  Filled 2014-07-28: qty 2

## 2014-07-28 NOTE — ED Notes (Signed)
Patient is resting comfortably. 

## 2014-07-28 NOTE — ED Notes (Signed)
Onset today while eating with family at home developed sudden onset right upper and lower extremity weakness and right arm drift numbness. Bilateral unequal grip right less then left. BP 212/90 prior to arrival. Alert answering and following commands appropriate.

## 2014-07-28 NOTE — H&P (Signed)
Admission H&P    Chief Complaint: Acute onset of right-sided weakness and numbness.  HPI: Sherry Lambert is an 73 y.o. female with hypertension, peripheral vascular disease, diabetes mellitus, peripheral arterial disease and COPD experienced acute onset of weakness and numbness involving the right side at 6:45 PM today. She has no previous history of stroke nor TIA. She's been taking aspirin daily. 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 Patient did not experience a headache. Blood pressure was elevated requiring immediate intervention with labetalol IV. NIH stroke score was 5.  LSN: 6:45 PM on 07/28/2014 tPA Given: No: Acute [name] mRankin:  Past Medical History  Diagnosis Date  . Uterine cancer   . Emphysema of lung   . GERD (gastroesophageal reflux disease)   . Hypertension   . [name]   . Diabetes mellitus   . [name]   . Peripheral arterial disease     Past Surgical History  Procedure Laterality Date  . Tonsillectomy    . Abdominal hysterectomy    . Colonoscopy    . Esophagogastroduodenoscopy N/A 12/23/2012    Procedure: ESOPHAGOGASTRODUODENOSCOPY (EGD);  Surgeon: Inda Castle, MD;  Location: [facility];  Service: Endoscopy;  Laterality: N/A;  . Balloon dilation N/A 12/23/2012    Procedure: BALLOON DILATION;  Surgeon: Inda Castle, MD;  Location: [facility];  Service: Endoscopy;  Laterality: N/A;  . Esophagogastroduodenoscopy N/A 01/15/2013    Procedure: ESOPHAGOGASTRODUODENOSCOPY (EGD);  Surgeon: Inda Castle, MD;  Location: [facility];  Service: Endoscopy;  Laterality: N/A;  . Balloon dilation N/A 01/15/2013    Procedure: BALLOON DILATION;  Surgeon: Inda Castle, MD;  Location: [facility];  Service: Endoscopy;  Laterality: N/A;    Family History  Problem Relation Age of Onset  . Hypertension Father   . Diabetes Father   . Cancer Neg Hx   . Early death Neg Hx   . Stroke Neg Hx     Social History:  reports that she has quit smoking. She has never used smokeless tobacco. She reports that she does not drink alcohol or use illicit drugs.  Allergies: No Known Allergies  Medications: Medications medications prior to admission were personally reviewed by me.  ROS: History obtained from the patient  General ROS: negative for - chills, fatigue, fever, night sweats, weight gain or weight loss Psychological ROS: negative for - behavioral disorder, hallucinations, memory difficulties, mood swings or suicidal ideation Ophthalmic ROS: negative for - blurry vision, double vision, eye pain or loss of vision ENT ROS: negative for - epistaxis, nasal discharge, oral lesions, sore throat, tinnitus or vertigo Allergy and Immunology ROS: negative for - hives or itchy/watery eyes Hematological and Lymphatic ROS: negative for - bleeding problems, bruising or swollen lymph nodes Endocrine ROS: negative for - galactorrhea, hair pattern changes, polydipsia/polyuria or temperature intolerance Respiratory ROS: negative for - cough, hemoptysis, shortness of breath or wheezing Cardiovascular ROS: negative for - chest pain, dyspnea on exertion, edema or irregular heartbeat Gastrointestinal ROS: negative for - abdominal pain, diarrhea, hematemesis, nausea/vomiting or stool incontinence Genito-Urinary ROS: negative for - dysuria, hematuria, incontinence or urinary frequency/urgency Musculoskeletal ROS: negative for - joint swelling or muscular weakness Neurological ROS: as noted in HPI Dermatological ROS: negative for rash and skin lesion changes  Physical Examination: Blood pressure 132/68, pulse 75, temperature 97.9 F (36.6 C), temperature source Oral, resp. rate 24, height 5' (1.524 m), weight 61.236 kg (135 lb), SpO2 97 %.  HEENT-  Normocephalic, no lesions, without obvious abnormality.  Normal external eye and conjunctiva.  Normal TM's bilaterally.  Normal auditory canals and external  ears. Normal external nose, mucus membranes and septum.  Normal pharynx. Neck supple with no masses, nodes, nodules or enlargement. Cardiovascular - regular rate and rhythm, S1, S2 normal, no murmur, click, rub or gallop Lungs - chest clear, no wheezing, rales, normal symmetric air entry  Abdomen - soft, non-tender; bowel sounds normal; no masses,  no organomegaly Extremities - no joint deformities, effusion, or inflammation and no edema, mild cyanosis of left second toe noted.  Neurologic Examination: Mental Status: Alert, oriented, thought content appropriate.  Speech fluent without evidence of aphasia. Able to follow commands without difficulty. Cranial Nerves: II-Visual fields were normal. III/IV/VI-Pupils were equal and reacted. Extraocular movements were full and conjugate.    V/VII-marked numbness involving right side of the face; no facial weakness. VIII-normal. X-normal speech. Motor: Moderate distal weakness of right upper extremity; no upper normal lower extremity drift; muscle tone was normal throughout. Sensory: Marked numbness to tactile simulation of right extremities: Normal sensory findings on the left. Deep Tendon Reflexes: 2+ and asymmetric with responses elicited from right side compared to left. Plantars: Extensor on the right and flexor on the left Cerebellar: Moderately impaired finger to nose and heel to shin testing on the right. Carotid auscultation: Normal  Results for orders placed or performed during the hospital encounter of 07/28/14 (from the past 48 hour(s))  Protime-INR     Status: None   Collection Time: 07/28/14  7:26 PM  Result Value Ref Range   Prothrombin Time 13.4 11.6 - 15.2 seconds   INR 1.01 0.00 - 1.49  APTT     Status: None   Collection Time: 07/28/14  7:26 PM  Result Value Ref Range   aPTT 30 24 - 37 seconds  CBC     Status: None   Collection Time: 07/28/14  7:26 PM  Result Value Ref Range   WBC 10.2 4.0 - 10.5 K/uL   RBC 5.00 3.87 -  5.11 MIL/uL   Hemoglobin 14.7 12.0 - 15.0 g/dL   HCT 45.8 36.0 - 46.0 %   MCV 91.6 78.0 - 100.0 fL   MCH 29.4 26.0 - 34.0 pg   MCHC 32.1 30.0 - 36.0 g/dL   RDW 13.6 11.5 - 15.5 %   Platelets 272 150 - 400 K/uL  Differential     Status: None   Collection Time: 07/28/14  7:26 PM  Result Value Ref Range   Neutrophils Relative % 61 43 - 77 %   Neutro Abs 6.2 1.7 - 7.7 K/uL   Lymphocytes Relative 32 12 - 46 %   Lymphs Abs 3.3 0.7 - 4.0 K/uL   Monocytes Relative 6 3 - 12 %   Monocytes Absolute 0.6 0.1 - 1.0 K/uL   Eosinophils Relative 1 0 - 5 %   Eosinophils Absolute 0.1 0.0 - 0.7 K/uL   Basophils Relative 0 0 - 1 %   Basophils Absolute 0.0 0.0 - 0.1 K/uL  Comprehensive metabolic panel     Status: Abnormal   Collection Time: 07/28/14  7:26 PM  Result Value Ref Range   Sodium 142 137 - 147 mEq/L   Potassium 4.0 3.7 - 5.3 mEq/L   Chloride 104 96 - 112 mEq/L   CO2 25 19 - 32 mEq/L   Glucose, Bld 112 (H) 70 - 99 mg/dL   BUN 13 6 - 23 mg/dL  Creatinine, Ser 0.87 0.50 - 1.10 mg/dL   Calcium 9.5 8.4 - 10.5 mg/dL   Total Protein 7.4 6.0 - 8.3 g/dL   Albumin 3.9 3.5 - 5.2 g/dL   AST 18 0 - 37 U/L   ALT 15 0 - 35 U/L   Alkaline Phosphatase 90 39 - 117 U/L   Total Bilirubin <0.2 (L) 0.3 - 1.2 mg/dL   GFR calc non Af Amer 65 (L) >90 mL/min   GFR calc Af Amer 75 (L) >90 mL/min    Comment: (NOTE) The eGFR has been calculated using the CKD EPI equation. This calculation has not been validated in all clinical situations. eGFR's persistently <90 mL/min signify possible Chronic Kidney Disease.    Anion gap 13 5 - 15  I-stat troponin, ED (not at [facility])     Status: None   Collection Time: 07/28/14  7:35 PM  Result Value Ref Range   Troponin i, poc 0.00 0.00 - 0.08 ng/mL   Comment 3            Comment: Due to the release kinetics of cTnI, a negative result within the first hours of the onset of symptoms does not rule out myocardial infarction with certainty. If myocardial infarction is  still suspected, repeat the test at appropriate intervals.   Ethanol     Status: None   Collection Time: 07/28/14  7:36 PM  Result Value Ref Range   Alcohol, Ethyl (B) <11 0 - 11 mg/dL    Comment:        LOWEST DETECTABLE LIMIT FOR SERUM ALCOHOL IS 11 mg/dL FOR MEDICAL PURPOSES ONLY   I-Stat Chem 8, ED     Status: Abnormal   Collection Time: 07/28/14  7:59 PM  Result Value Ref Range   Sodium 143 137 - 147 mEq/L   Potassium 3.8 3.7 - 5.3 mEq/L   Chloride 104 96 - 112 mEq/L   BUN 14 6 - 23 mg/dL   Creatinine, Ser 0.90 0.50 - 1.10 mg/dL   Glucose, Bld 113 (H) 70 - 99 mg/dL   Calcium, Ion 1.23 1.13 - 1.30 mmol/L   TCO2 24 0 - 100 mmol/L   Hemoglobin 16.3 (H) 12.0 - 15.0 g/dL   HCT 48.0 (H) 36.0 - 46.0 %   Ct Head (brain) Wo Contrast  07/28/2014   CLINICAL DATA:  Code stroke. Right-sided weakness. History of hypertension.  EXAM: CT HEAD WITHOUT CONTRAST  TECHNIQUE: Contiguous axial images were obtained from the base of the skull through the vertex without intravenous contrast.  COMPARISON:  None.  FINDINGS: There is an 18 mm acute hemorrhage noted in the area of the left thalamus and posterior limb of the internal capsule. There is a small amount of blood also noted in the adjacent left lateral ventricle. No hydrocephalus. No mass effect or midline shift.  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 No acute ischemic infarct. No extra-axial fluid collection.  No acute calvarial abnormality.  IMPRESSION: Acute left delay I can't posterior limb of the internal capsule hemorrhage measuring 18 mm. Intraventricular extension.  Critical Value/emergent results were called by telephone at the time of interpretation on 07/28/2014 at 7:42 pm to Dr. Doy Mince , who verbally acknowledged these results.   Electronically Signed   By: Rolm Baptise M.D.   On: 07/28/2014 19:43    Assessment: 73 y.o. 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  Stroke Risk Factors - diabetes  mellitus and hypertension  Plan: 1. HgbA1c, fasting lipid panel 2. MRI, MRA  of the brain without contrast 3. PT consult, OT consult 4. Echocardiogram 5. Carotid dopplers 6. Prophylactic therapy-None 7. Repeat noncontrast CT scan in the a.m., worsen or if patient shows signs of clinical deterioration. 8. Telemetry monitoring   C.R. Nicole Kindred, MD [facility] [phone]  07/28/2014, 8:27 PM

## 2014-07-28 NOTE — ED Provider Notes (Signed)
CSN: [id number]     Arrival date & time 07/28/14  1926 History   First MD Initiated Contact with Patient 07/28/14 1932     Chief Complaint  Patient presents with  . Code Stroke    @EDPCLEARED @ (Consider location/radiation/quality/duration/timing/severity/associated sxs/prior Treatment) HPI Comments: Patient from home with acute onset of right-sided weakness and numbness. Lasting normal 6:30. She has a history of hypertension, emphysema, diabetes. Blood sugar normal on arrival. Blood pressure 212/90. Denies headache. Denies chest pain or shortness of breath. Endorses weakness and numbness in her right side. No difficulty talking or swallowing. Level V caveat for need for emergent intervention.   The history is provided by the patient and the EMS personnel. The history is limited by the condition of the patient.    Past Medical History  Diagnosis Date  . Uterine cancer   . Emphysema of lung   . GERD (gastroesophageal reflux disease)   . Hypertension   . [name]   . Diabetes mellitus   . [name]   . Peripheral arterial disease    Past Surgical History  Procedure Laterality Date  . Tonsillectomy    . Abdominal hysterectomy    . Colonoscopy    . Esophagogastroduodenoscopy N/A 12/23/2012    Procedure: ESOPHAGOGASTRODUODENOSCOPY (EGD);  Surgeon: Inda Castle, MD;  Location: [facility];  Service: Endoscopy;  Laterality: N/A;  . Balloon dilation N/A 12/23/2012    Procedure: BALLOON DILATION;  Surgeon: Inda Castle, MD;  Location: [facility];  Service: Endoscopy;  Laterality: N/A;  . Esophagogastroduodenoscopy N/A 01/15/2013    Procedure: ESOPHAGOGASTRODUODENOSCOPY (EGD);  Surgeon: Inda Castle, MD;  Location: [facility];  Service: Endoscopy;  Laterality: N/A;  . Balloon dilation N/A 01/15/2013    Procedure: BALLOON DILATION;  Surgeon: Inda Castle, MD;  Location: [facility];  Service: Endoscopy;  Laterality: N/A;   Family History  Problem Relation  Age of Onset  . Hypertension Father   . Diabetes Father   . Cancer Neg Hx   . Early death Neg Hx   . Stroke Neg Hx    History  Substance Use Topics  . Smoking status: Former Smoker -- 61 years  . Smokeless tobacco: Never Used  . Alcohol Use: No   OB History    No data available     Review of Systems  Constitutional: Negative for fever, activity change and appetite change.  HENT: Negative for congestion and rhinorrhea.   Eyes: Negative for visual disturbance.  Respiratory: Negative for cough, chest tightness and shortness of breath.   Cardiovascular: Negative for chest pain.  Gastrointestinal: Negative for nausea, vomiting and abdominal pain.  Genitourinary: Negative for dysuria and hematuria.  Musculoskeletal: Negative for myalgias, back pain and arthralgias.  Skin: Negative for rash.  Neurological: Positive for weakness and numbness. Negative for dizziness and headaches.  A complete 10 system review of systems was obtained and all systems are negative except as noted in the HPI and PMH.      Allergies  Review of patient's allergies indicates no known allergies.  Home Medications   Prior to Admission medications   Medication Sig Start Date End Date Taking? Authorizing Provider  aspirin 81 MG tablet Take 81 mg by mouth daily.   Yes Historical Provider, MD  Fluticasone-Salmeterol (ADVAIR) 100-50 MCG/DOSE AEPB Inhale 1 puff into the lungs 2 (two) times daily. 01/19/14  Yes Jearld Fenton, NP  lisinopril (PRINIVIL,ZESTRIL) 10 MG tablet Take 1 tablet (10 mg total) by mouth  daily. 12/03/13  Yes Janith Lima, MD   BP 138/85 mmHg  Pulse 69  Temp(Src) 97.9 F (36.6 C) (Oral)  Resp 22  Ht 5' (1.524 m)  Wt 135 lb (61.236 kg)  BMI 26.37 kg/m2  SpO2 97% Physical Exam  Constitutional: She is oriented to person, place, and time. She appears well-developed and well-nourished. No distress.  HENT:  Head: Normocephalic and atraumatic.  Mouth/Throat: Oropharynx is clear and moist.  No oropharyngeal exudate.  Eyes: Conjunctivae and EOM are normal. Pupils are equal, round, and reactive to light.  Neck: Normal range of motion. Neck supple.  No meningismus.  Cardiovascular: Normal rate, regular rhythm, normal heart sounds and intact distal pulses.   No murmur heard. Pulmonary/Chest: Effort normal and breath sounds normal. No respiratory distress. She exhibits no tenderness.  Abdominal: Soft. There is no tenderness. There is no rebound and no guarding.  Musculoskeletal: Normal range of motion. She exhibits no edema or tenderness.  Neurological: She is alert and oriented to person, place, and time. No cranial nerve deficit. She exhibits normal muscle tone. Coordination abnormal.  Decrease sensation to right face. Cranial nerves otherwise intact. Decreased sensation to right upper extremity with distal motor weakness. 5/5 strength of left arm and left leg. 5/5 strength of right leg. Difficulty with finger to nose on R.  Skin: Skin is warm.  Psychiatric: She has a normal mood and affect. Her behavior is normal.  Nursing note and vitals reviewed.   ED Course  Procedures (including critical care time) Labs Review Labs Reviewed  COMPREHENSIVE METABOLIC PANEL - Abnormal; Notable for the following:    Glucose, Bld 112 (*)    Total Bilirubin <0.2 (*)    GFR calc non Af Amer 65 (*)    GFR calc Af Amer 75 (*)    All other components within normal limits  I-STAT CHEM 8, ED - Abnormal; Notable for the following:    Glucose, Bld 113 (*)    Hemoglobin 16.3 (*)    HCT 48.0 (*)    All other components within normal limits  MRSA PCR SCREENING  PROTIME-INR  APTT  CBC  DIFFERENTIAL  ETHANOL  URINE RAPID DRUG SCREEN (HOSP PERFORMED)  URINALYSIS, ROUTINE W REFLEX MICROSCOPIC  I-STAT TROPOININ, ED  I-STAT TROPOININ, ED  I-STAT TROPOININ, ED    Imaging Review Ct Head (brain) Wo Contrast  07/28/2014   CLINICAL DATA:  Code stroke. Right-sided weakness. History of hypertension.   EXAM: CT HEAD WITHOUT CONTRAST  TECHNIQUE: Contiguous axial images were obtained from the base of the skull through the vertex without intravenous contrast.  COMPARISON:  None.  FINDINGS: There is an 18 mm acute hemorrhage noted in the area of the left thalamus and posterior limb of the internal capsule. There is a small amount of blood also noted in the adjacent left lateral ventricle. No hydrocephalus. No mass effect or midline shift.  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 No acute ischemic infarct. No extra-axial fluid collection.  No acute calvarial abnormality.  IMPRESSION: Acute left delay I can't posterior limb of the internal capsule hemorrhage measuring 18 mm. Intraventricular extension.  Critical Value/emergent results were called by telephone at the time of interpretation on 07/28/2014 at 7:42 pm to Dr. Doy Mince , who verbally acknowledged these results.   Electronically Signed   By: Rolm Baptise M.D.   On: 07/28/2014 19:43     EKG Interpretation   Date/Time:  Wednesday July 28 2014 19:54:09 EST Ventricular Rate:  80 PR Interval:  155 QRS Duration: 80 QT Interval:  413 QTC Calculation: 476 R Axis:   48 Text Interpretation:  Sinus rhythm Baseline wander in lead(s) III No  previous ECGs available Confirmed by [place]  MD, [place] [phone]) on  07/28/2014 8:01:05 PM      MDM   Final diagnoses:  Nontraumatic intracerebral hemorrhage, unspecified cerebral location   Onset of numbness and right-sided weakness. Stroke on arrival. CT shows thalamic hemorrhage on the left side. Patient is not anticoagulated. She denies headache. She is hypertensive to 180/86.  Seen with Dr. Nicole Kindred on arrival. IV labetalol given. Maintaining airway. NIH stroke scale 5.  Family updated.  Dr. Nicole Kindred to admit to ICU.    CRITICAL CARE Performed by: Ezequiel Essex Total critical care time: 30 Critical care time was exclusive of separately billable procedures and  treating other patients. Critical care was necessary to treat or prevent imminent or life-threatening deterioration. Critical care was time spent personally by me on the following activities: development of treatment plan with patient and/or surrogate as well as nursing, discussions with consultants, evaluation of patient's response to treatment, examination of patient, obtaining history from patient or surrogate, ordering and performing treatments and interventions, ordering and review of laboratory studies, ordering and review of radiographic studies, pulse oximetry and re-evaluation of patient's condition.    Ezequiel Essex, MD 07/28/14 [phone]

## 2014-07-28 NOTE — Code Documentation (Signed)
Sherry Lambert is a 73yo wf brought to [facility] by [facility] for acute onset Rt side weakness at 1830.  Per the pt she was getting ready to eat her dinner when she felt funny and had to sit down.  She then noticed she had Rt arm and leg weakness & EMS was called.  NIH 5 for Rt side ataxia, RUE weakness, and sensory deficit.  Not a candidate for acute treatment due to bleed.

## 2014-07-28 NOTE — ED Notes (Signed)
Family at bedside. 

## 2014-07-28 NOTE — ED Notes (Signed)
Pt reports feeling "funny" around 6:30 tonight at supper.  Pt reports right side went numb.  Pt is alert and oriented with sensational and muscular deficits to right side.

## 2014-07-28 NOTE — ED Notes (Signed)
EKG completed and given to EDP.  

## 2014-07-28 NOTE — ED Notes (Signed)
Cancelled Code Stroke @ 1949 per Dr. Nicole Kindred

## 2014-07-29 ENCOUNTER — Encounter (HOSPITAL_COMMUNITY): Payer: Self-pay

## 2014-07-29 ENCOUNTER — Inpatient Hospital Stay (HOSPITAL_COMMUNITY): Payer: Medicare HMO

## 2014-07-29 DIAGNOSIS — I619 Nontraumatic intracerebral hemorrhage, unspecified: Secondary | ICD-10-CM | POA: Insufficient documentation

## 2014-07-29 DIAGNOSIS — G936 Cerebral edema: Secondary | ICD-10-CM | POA: Insufficient documentation

## 2014-07-29 LAB — URINALYSIS, ROUTINE W REFLEX MICROSCOPIC
Bilirubin Urine: NEGATIVE
GLUCOSE, UA: NEGATIVE mg/dL
Ketones, ur: NEGATIVE mg/dL
Nitrite: POSITIVE — AB
PROTEIN: NEGATIVE mg/dL
Specific Gravity, Urine: 1.02 (ref 1.005–1.030)
Urobilinogen, UA: 0.2 mg/dL (ref 0.0–1.0)
pH: 6 (ref 5.0–8.0)

## 2014-07-29 LAB — RAPID URINE DRUG SCREEN, HOSP PERFORMED
AMPHETAMINES: NOT DETECTED
Barbiturates: NOT DETECTED
Benzodiazepines: NOT DETECTED
Cocaine: NOT DETECTED
Opiates: NOT DETECTED
Tetrahydrocannabinol: NOT DETECTED

## 2014-07-29 LAB — URINE MICROSCOPIC-ADD ON

## 2014-07-29 LAB — MRSA PCR SCREENING: MRSA by PCR: NEGATIVE

## 2014-07-29 MED ORDER — PANTOPRAZOLE SODIUM 40 MG PO TBEC
40.0000 mg | DELAYED_RELEASE_TABLET | Freq: Every day | ORAL | Status: DC
  Administered 2014-07-29 – 2014-08-02 (×5): 40 mg via ORAL
  Filled 2014-07-29 (×5): qty 1

## 2014-07-29 NOTE — Progress Notes (Signed)
STROKE TEAM PROGRESS NOTE   HISTORY Sherry Lambert is an 73 y.o. female with hypertension, peripheral vascular disease, diabetes mellitus, peripheral arterial disease and COPD experienced acute onset of weakness and numbness involving the right side at 6:45 PM today 07/28/2014. She has no previous history of stroke nor TIA. She's been taking aspirin daily. 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 Patient did not experience a headache. Blood pressure was elevated requiring immediate intervention with labetalol IV. NIH stroke score was 5. Patient was not administered TPA secondary to hemorrhage. She was admitted to the neuro ICU for further evaluation and treatment.   SUBJECTIVE (INTERVAL HISTORY) No family is at the bedside.  Overall she feels her condition is gradually improving.    OBJECTIVE Temp:  [97.8 F (36.6 C)-98.2 F (36.8 C)] 98.2 F (36.8 C) (11/26 0844) Pulse Rate:  [62-88] 88 (11/26 0844) Cardiac Rhythm:  [-] Normal sinus rhythm (11/25 2300) Resp:  [14-26] 20 (11/26 0844) BP: (109-180)/(46-99) 155/46 mmHg (11/26 0844) SpO2:  [95 %-97 %] 95 % (11/26 0844) FiO2 (%):  [21 %] 21 % (11/25 1938) Weight:  [61.236 kg (135 lb)] 61.236 kg (135 lb) (11/25 1945)  No results for input(s): GLUCAP in the last 168 hours.  Recent Labs Lab 07/28/14 1926 07/28/14 1959  NA 142 143  K 4.0 3.8  CL 104 104  CO2 25  --   GLUCOSE 112* 113*  BUN 13 14  CREATININE 0.87 0.90  CALCIUM 9.5  --     Recent Labs Lab 07/28/14 1926  AST 18  ALT 15  ALKPHOS 90  BILITOT <0.2*  PROT 7.4  ALBUMIN 3.9    Recent Labs Lab 07/28/14 1926 07/28/14 1959  WBC 10.2  --   NEUTROABS 6.2  --   HGB 14.7 16.3*  HCT 45.8 48.0*  MCV 91.6  --   PLT 272  --    No results for input(s): CKTOTAL, CKMB, CKMBINDEX, TROPONINI in the last 168 hours.  Recent Labs  07/28/14 1926  LABPROT 13.4  INR 1.01    Recent Labs  07/29/14 0308  COLORURINE  YELLOW  LABSPEC 1.020  PHURINE 6.0  GLUCOSEU NEGATIVE  HGBUR SMALL*  BILIRUBINUR NEGATIVE  KETONESUR NEGATIVE  PROTEINUR NEGATIVE  UROBILINOGEN 0.2  NITRITE POSITIVE*  LEUKOCYTESUR MODERATE*       Component Value Date/Time   CHOL 205* 09/15/2013 1042   TRIG 55.0 09/15/2013 1042   HDL 69.00 09/15/2013 1042   CHOLHDL 3 09/15/2013 1042   VLDL 11.0 09/15/2013 1042   Lab Results  Component Value Date   HGBA1C 6.7* 11/12/2013      Component Value Date/Time   LABOPIA NONE DETECTED 07/29/2014 0308   COCAINSCRNUR NONE DETECTED 07/29/2014 0308   LABBENZ NONE DETECTED 07/29/2014 0308   AMPHETMU NONE DETECTED 07/29/2014 0308   THCU NONE DETECTED 07/29/2014 0308   LABBARB NONE DETECTED 07/29/2014 0308     Recent Labs Lab 07/28/14 1936  ETH <11    Ct Head (brain) Wo Contrast  07/28/2014   CLINICAL DATA:  Code stroke. Right-sided weakness. History of hypertension.  EXAM: CT HEAD WITHOUT CONTRAST  TECHNIQUE: Contiguous axial images were obtained from the base of the skull through the vertex without intravenous contrast.  COMPARISON:  None.  FINDINGS: There is an 18 mm acute hemorrhage noted in the area of the left thalamus and posterior limb of the internal capsule. There is a small amount of blood also noted  in the adjacent left lateral ventricle. No hydrocephalus. No mass effect or midline shift.  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 No acute ischemic infarct. No extra-axial fluid collection.  No acute calvarial abnormality.  IMPRESSION: Acute left delay I can't posterior limb of the internal capsule hemorrhage measuring 18 mm. Intraventricular extension.  Critical Value/emergent results were called by telephone at the time of interpretation on 07/28/2014 at 7:42 pm to Dr. Doy Mince , who verbally acknowledged these results.   Electronically Signed   By: Rolm Baptise M.D.   On: 07/28/2014 19:43   Chest Port 1 View  07/29/2014   CLINICAL DATA:  Short of  breath.  History of COPD.  EXAM: PORTABLE CHEST - 1 VIEW  COMPARISON:  None.  FINDINGS: The heart size and mediastinal contours are within normal limits. There is a small hiatal hernia noted. Both lungs are clear. The visualized skeletal structures are unremarkable.  IMPRESSION: No active disease.   Electronically Signed   By: Kerby Moors M.D.   On: 07/29/2014 02:19     PHYSICAL EXAM Pleasant elderly caucasian lady not in distress. Awake alert. Afebrile. Head is nontraumatic. Neck is supple without bruit. Hearing is normal. Cardiac exam no murmur or gallop. Lungs are clear to auscultation. Distal pulses are well felt. Neurological Exam : Awake alert oriented 3 with normal speech and language function. Pupils are equal reactive. Fundi were not visualized. Vision acuity seems adequate. Visual fields are full to bedside confrontational testing. Mild right lower facial weakness. Tongue midline. Motor system exam revealed no upper or lower eczema to drift. Mild weakness of right grip and intrinsic hand muscles. Orbits left over right upper extremity. Symmetric strength in lower eczema this. Mild subjective diminished touch and pinprick sensation on the right lower face arm and leg. Coordination appears intact. Deep tendon reflexes are 2+ symmetric. Plantars are downgoing.Gait was not tested. ASSESSMENT/PLAN Sherry Lambert is a 73 y.o. female with history of hypertension, peripheral vascular disease, diabetes mellitus, peripheral arterial disease and COPD presenting with right side weakness and numbness. She did not receive IV t-PA due to hemorrhage.   Stroke:  Dominant left PLIC hemorrhage with IVH; hemorrhage felt to be secondary to hypertension given location, however BP not very elevated on admission  Resultant  Right hemiparesis and sensory deficit  MRI  Ordered for tomorrow  MRA  Ordered for tomorrow  Carotid Doppler  pending   2D Echo  pending   LDL pending   SCDs for VTE  prophylaxis  Diet NPO time specified , ST to check swallow  aspirin 81 mg orally every day prior to admission  Ongoing aggressive stroke risk factor management  Therapy recommendations:  Pending. To eval tomorrow. Keep in bed today.  Disposition:  Pending. Anticipate CIR.  Hypertension  Home meds:   Lisinopril  BP 180/86 on admission BP 109-180/48-92 past 24h (07/29/2014 @ 9:04 AM)  Stable BP goal < 160/90  Diabetes  HgbA1c pending  goal < 7.0  Other Stroke Risk Factors  Advanced age  Former Cigarette smoker  PAD  Hospital day # 1  Sherry BIBY, MSN, APRN, ANVP-BC, AGPCNP-BC [facility] Pager: [phone] 07/29/2014 9:19 AM  I have personally examined this patient, reviewed notes, independently viewed imaging studies, participated in medical decision making and plan of care. I have made any additions or clarifications directly to the above note. Agree with note above. I had a long discussion with the patient with regards to her brain  hemorrhage, risk for increase in hematoma expansion, hydrocephalus, potential need for intubation and need for aggressive blood pressure control and answered questions. This patient is critically ill and at significant risk of neurological worsening, death and care requires constant monitoring of vital signs, hemodynamics,respiratory and cardiac monitoring,review of multiple databases, neurological assessment, discussion with family, other specialists and medical decision making of high complexity.I have made any additions or clarifications directly to the above note.  I spent 30 minutes of neurocritical care time  in the care of  this patient.  [name], MD Medical Director [facility] Pager: [phone] 07/29/2014 11:53 AM   To contact Stroke Continuity provider, please refer to [email or web address] After hours, contact General Neurology

## 2014-07-29 NOTE — Progress Notes (Signed)
[place] Progress Note Patient Name: Sherry Lambert DOB: 1941/06/21 MRN: [id number]   Date of Service  07/29/2014  HPI/Events of Note  Intracranial hemorrhage Stable per camera check Neurology primary  eICU Interventions  No eICU intervention     Intervention Category Evaluation Type: New Patient Evaluation  Sherry Lambert 07/29/2014, 1:05 AM

## 2014-07-29 NOTE — Plan of Care (Signed)
Problem: Acute Treatment Outcomes Goal: Neuro exam at baseline or improved Outcome: Completed/Met Date Met:  07/29/14 Goal: BP within ordered parameters Outcome: Progressing Goal: Pain controlled Outcome: Completed/Met Date Met:  07/29/14 Goal: Nausea and vomiting controlled Outcome: Completed/Met Date Met:  07/29/14 Goal: Prognosis discussed with family/patient as appropriate Outcome: Completed/Met Date Met:  07/29/14

## 2014-07-29 NOTE — Plan of Care (Signed)
Problem: Consults Goal: Skin Care Protocol Initiated - if Braden Score 18 or less If consults are not indicated, leave blank or document N/A  Outcome: Completed/Met Date Met:  07/29/14 Goal: Diabetes Guidelines if Diabetic/Glucose > 140 If diabetic or lab glucose is > 140 mg/dl - Initiate Diabetes/Hyperglycemia Guidelines & Document Interventions  Outcome: Not Applicable Date Met:  83/41/96  Problem: Acute Treatment Outcomes Goal: Coagulation normalized Outcome: Completed/Met Date Met:  07/29/14 Goal: Airway maintained/protected Outcome: Completed/Met Date Met:  07/29/14 Goal: 02 Sats > 94% Outcome: Completed/Met Date Met:  07/29/14

## 2014-07-29 NOTE — Progress Notes (Signed)
*  PRELIMINARY RESULTS* Vascular Ultrasound Carotid Duplex (Doppler) has been completed.   Findings suggest 1-39% internal carotid artery stenosis bilaterally. The left vertebral artery is patent with antegrade flow, unable to visualize the right vertebral artery.  07/29/2014 11:42 AM Maudry Mayhew, RVT, RDCS, RDMS

## 2014-07-29 NOTE — Evaluation (Signed)
Clinical/Bedside Swallow Evaluation Patient Details  Name: Sherry Lambert MRN: [id number] Date of Birth: 04/06/1941  Today's Date: 07/29/2014 Time: 1246-1305 SLP Time Calculation (min) (ACUTE ONLY): 19 min  Past Medical History:  Past Medical History  Diagnosis Date  . Emphysema of lung   . GERD (gastroesophageal reflux disease)   . Hypertension   . [name]   . [name]   . Peripheral arterial disease   . Diabetes mellitus without complication    Past Surgical History:  Past Surgical History  Procedure Laterality Date  . Tonsillectomy    . Abdominal hysterectomy    . Colonoscopy    . Esophagogastroduodenoscopy N/A 12/23/2012    Procedure: ESOPHAGOGASTRODUODENOSCOPY (EGD);  Surgeon: Inda Castle, MD;  Location: [facility];  Service: Endoscopy;  Laterality: N/A;  . Balloon dilation N/A 12/23/2012    Procedure: BALLOON DILATION;  Surgeon: Inda Castle, MD;  Location: [facility];  Service: Endoscopy;  Laterality: N/A;  . Esophagogastroduodenoscopy N/A 01/15/2013    Procedure: ESOPHAGOGASTRODUODENOSCOPY (EGD);  Surgeon: Inda Castle, MD;  Location: [facility];  Service: Endoscopy;  Laterality: N/A;  . Balloon dilation N/A 01/15/2013    Procedure: BALLOON DILATION;  Surgeon: Inda Castle, MD;  Location: [facility];  Service: Endoscopy;  Laterality: N/A;   HPI:  Sherry Lambert is a 73 y.o. female with hypertension, peripheral vascular disease, diabetes mellitus, peripheral arterial disease and COPD experienced acute onset of weakness and numbness involving the right side at 6:45 PM 07/28/2014. 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 PMH also significant for esophageal stretching and GERD.   Assessment / Plan / Recommendation Clinical Impression  Pt has a mild oral dysphagia due to right-sided weakness and reduced sensation, with SLP providing Min cues for self-monitoring of right-sided anterior  loss and oral residuals. Pt had an immediate cough with straw sips of water, but with SLP intervention for small, single cup sips, no further signs of aspiration were observed. Recommend Dys 3 diet and thin liquids without straws. SLP to return for diet tolerance and cognitive-linguistic evaluation.    Aspiration Risk  Mild    Diet Recommendation Dysphagia 3 (Mechanical Soft);Thin liquid   Liquid Administration via: Cup;No straw Medication Administration: Whole meds with puree Supervision: Patient able to self feed;Full supervision/cueing for compensatory strategies Compensations: Slow rate;Small sips/bites;Check for pocketing;Check for anterior loss Postural Changes and/or Swallow Maneuvers: Seated upright 90 degrees;Upright 30-60 min after meal    Other  Recommendations Oral Care Recommendations: Oral care BID   Follow Up Recommendations  Inpatient Rehab    Frequency and Duration min 2x/week  2 weeks   Pertinent Vitals/Pain n/a    SLP Swallow Goals     Swallow Study Prior Functional Status       General Date of Onset: 07/28/14 HPI: Sherry Lambert is a 74 y.o. female with hypertension, peripheral vascular disease, diabetes mellitus, peripheral arterial disease and COPD experienced acute onset of weakness and numbness involving the right side at 6:45 PM 07/28/2014. 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 PMH also significant for esophageal stretching and GERD. Type of Study: Bedside swallow evaluation Previous Swallow Assessment: none in chart Diet Prior to this Study: NPO Temperature Spikes Noted: No Respiratory Status: Room air History of Recent Intubation: No Behavior/Cognition: Alert;Cooperative;Pleasant mood;Requires cueing Oral Cavity - Dentition: Dentures, top;Dentures, bottom (partials on bottom) Self-Feeding Abilities: Able to feed self  Patient Positioning: Upright in bed Baseline Vocal Quality:  Clear Volitional Cough: Strong Volitional Swallow: Able to elicit    Oral/Motor/Sensory Function Overall Oral Motor/Sensory Function: Impaired Labial ROM: Reduced right Labial Symmetry: Abnormal symmetry right Labial Strength: Within Functional Limits Labial Sensation: Reduced Lingual ROM: Within Functional Limits Lingual Symmetry: Within Functional Limits Lingual Strength: Within Functional Limits Facial ROM: Reduced right Facial Symmetry: Right droop Facial Strength: Reduced Mandible: Within Functional Limits   Ice Chips Ice chips: Not tested   Thin Liquid Thin Liquid: Impaired Presentation: Cup;Self Fed;Straw Pharyngeal  Phase Impairments: Cough - Immediate    Nectar Thick Nectar Thick Liquid: Not tested   Honey Thick Honey Thick Liquid: Not tested   Puree Puree: Within functional limits Presentation: Spoon;Self Fed   Solid   GO    Solid: Impaired Presentation: Self Fed Oral Phase Functional Implications: Right anterior spillage;Oral residue        Germain Osgood, M.A. CCC-SLP [phone]  Germain Osgood 07/29/2014,1:27 PM

## 2014-07-30 ENCOUNTER — Inpatient Hospital Stay (HOSPITAL_COMMUNITY): Payer: Medicare HMO

## 2014-07-30 DIAGNOSIS — I517 Cardiomegaly: Secondary | ICD-10-CM

## 2014-07-30 DIAGNOSIS — I618 Other nontraumatic intracerebral hemorrhage: Secondary | ICD-10-CM

## 2014-07-30 DIAGNOSIS — G8190 Hemiplegia, unspecified affecting unspecified side: Secondary | ICD-10-CM

## 2014-07-30 LAB — HEMOGLOBIN A1C
Hgb A1c MFr Bld: 6.6 % — ABNORMAL HIGH (ref <5.7)
MEAN PLASMA GLUCOSE: 143 mg/dL — AB (ref <117)

## 2014-07-30 LAB — LIPID PANEL
CHOL/HDL RATIO: 2.5 ratio
Cholesterol: 161 mg/dL (ref 0–200)
HDL: 64 mg/dL (ref >39)
LDL CALC: 83 mg/dL (ref 0–99)
Triglycerides: 72 mg/dL (ref <150)
VLDL: 14 mg/dL (ref 0–40)

## 2014-07-30 MED ORDER — LISINOPRIL 10 MG PO TABS
10.0000 mg | ORAL_TABLET | Freq: Every day | ORAL | Status: DC
  Administered 2014-07-31 – 2014-08-03 (×4): 10 mg via ORAL
  Filled 2014-07-30 (×4): qty 1

## 2014-07-30 MED ORDER — MOMETASONE FURO-FORMOTEROL FUM 100-5 MCG/ACT IN AERO
2.0000 | INHALATION_SPRAY | Freq: Two times a day (BID) | RESPIRATORY_TRACT | Status: DC
  Administered 2014-07-30 – 2014-08-03 (×8): 2 via RESPIRATORY_TRACT
  Filled 2014-07-30: qty 8.8

## 2014-07-30 NOTE — Plan of Care (Signed)
Problem: Progression Outcomes Goal: Hemodynamically stable Outcome: Completed/Met Date Met:  07/30/14

## 2014-07-30 NOTE — Evaluation (Signed)
Physical Therapy Evaluation Patient Details Name: Sherry Lambert MRN: [id number] DOB: 12/19/1940 Today's Date: 07/30/2014   History of Present Illness  Sherry Lambert is a 73 y.o. female with hypertension, peripheral vascular disease, diabetes mellitus, peripheral arterial disease and COPD experienced acute onset of weakness and numbness involving the right side on 07/28/2014. She had been taking aspirin daily. 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 Blood pressure was elevated requiring immediate intervention with labetalol IV. NIH stroke score was 5. Patient was not administered TPA secondary to hemorrhage  Clinical Impression  Patient presents with decreased independence with mobility due to deficits listed in PT problem list.  She will benefit from skilled PT in the acute setting to allow return home with family assist following CIR level rehab.  Hemisensory deficits main issue with mobility as strength seems fair to good throughout.  She has family who all live close and will provide 24 hour assist at d/c.    Follow Up Recommendations CIR    Equipment Recommendations  Other (comment) (To be assessed)    Recommendations for Other Services Rehab consult     Precautions / Restrictions Precautions Precautions: Fall Restrictions Weight Bearing Restrictions: No      Mobility  Bed Mobility Overal bed mobility: Needs Assistance Bed Mobility: Supine to Sit     Supine to sit: Mod assist     General bed mobility comments: heavy mod assist for lifting trunk upright and guiding right LE off bed  Transfers Overall transfer level: Needs assistance Equipment used: Rolling walker (2 wheeled);None Transfers: Sit to/from [facility] Sit to Stand: Mod assist;+2 physical assistance Stand pivot transfers: Mod assist;+2 physical assistance       General  transfer comment: Pt requires assist for placement and control of Rt. UE and LE's in prep for safe transfers  Ambulation/Gait Ambulation/Gait assistance: Mod assist;+2 physical assistance Ambulation Distance (Feet): 8 Feet Assistive device: Rolling walker (2 wheeled) Gait Pattern/deviations: Step-to pattern;Decreased step length - right;Staggering right;Ataxic;Scissoring;Wide base of support     General Gait Details: problems mostly due to right side sensory deficit; unaware of if she has stepped with right foot or not and at least 2 episodes of near falls with max assist to recover due to not stepping with right foot; mod facilitation and cues for left weight shift and assist to progress right foot on couple of occasions. Patient leans right and demonstrates mild pusher tendency as part of hemisensory deficit.  Stairs            Wheelchair Mobility    Modified Rankin (Stroke Patients Only) Modified Rankin (Stroke Patients Only) Pre-Morbid Rankin Score: No symptoms Modified Rankin: Moderately severe disability     Balance Overall balance assessment: Needs assistance Sitting-balance support: Feet supported;Single extremity supported Sitting balance-Leahy Scale: Poor Sitting balance - Comments: static sitting with minguard to supervision at edge of bed, loss of balance with fatigue and dynamic reaching with min/mod assist  Postural control: Right lateral lean;Posterior lean Standing balance support: Bilateral upper extremity supported Standing balance-Leahy Scale: Poor Standing balance comment: assist for right UE support on walker due to decreased right awareness; needs assist for proper right foot placement to increase base of support and cues for left lateral lean; overall approx mod to max assist for balance  Pertinent Vitals/Pain Pain Assessment: No/denies pain    Home Living Family/patient expects to be discharged to:: Inpatient  rehab Living Arrangements: Alone Available Help at Discharge: Family;Available 24 hours/day Type of Home: House Home Access: Stairs to enter Entrance Stairs-Rails: None Entrance Stairs-Number of Steps: 2 Home Layout: One level Home Equipment: None Additional Comments: daughter in law reports they have access to shower seat and RW.  Family lives behind and beside her and are able to provide 24 hour assist as needed at discharge.     Prior Function Level of Independence: Independent         Comments: Pt drives, and was independent in the community setting      Hand Dominance   Dominant Hand: Right    Extremity/Trunk Assessment   Upper Extremity Assessment: Defer to OT evaluation RUE Deficits / Details: Pt demonstrates full AROM; but severe sensory loss.  She is able to reach for and drink from cup with verbal and tactile cues.  She requires use of visual compensation to successfully incorporate Rt. UE into activity    RUE Sensation: decreased light touch;decreased proprioception     Lower Extremity Assessment: RLE deficits/detail RLE Deficits / Details: strength grossly 4-/5; decreased sensory awareness and propriocetion throughout right side    Cervical / Trunk Assessment: Other exceptions  Communication   Communication: No difficulties  Cognition Arousal/Alertness: Awake/alert Behavior During Therapy: WFL for tasks assessed/performed Overall Cognitive Status: Impaired/Different from baseline Area of Impairment: Safety/judgement;Awareness;Problem solving         Safety/Judgement: Decreased awareness of deficits Awareness: Intellectual Problem Solving: Slow processing;Decreased initiation;Difficulty sequencing;Requires verbal cues;Requires tactile cues General Comments: Pt with Rt neglect which impact the above as her awareness of the Rt side is impaired, and she requires verbal and tactile cues to initiate and sequence movement on the Rt side as well as with ADL  tasks.  Min - mod cues for problem solving     General Comments General comments (skin integrity, edema, etc.): Pt and family instructed to encourage use of Rt. UE     Exercises        Assessment/Plan    PT Assessment Patient needs continued PT services  PT Diagnosis [name];Hemiplegia dominant side   PT Problem List Decreased strength;Decreased coordination;Impaired sensation;Decreased cognition;Decreased activity tolerance;Decreased knowledge of use of DME;Decreased balance;Decreased safety awareness;Impaired tone;Decreased mobility  PT Treatment Interventions DME instruction;Gait training;Balance training;Neuromuscular re-education;Functional mobility training;Patient/family education;Therapeutic activities;Therapeutic exercise   PT Goals (Current goals can be found in the Care Plan section) Acute Rehab PT Goals Patient Stated Goal: to go home     Frequency Min 4X/week   Barriers to discharge        Co-evaluation PT/OT/SLP Co-Evaluation/Treatment: Yes Reason for Co-Treatment: Complexity of the patient's impairments (multi-system involvement);For patient/therapist safety PT goals addressed during session: Mobility/safety with mobility;Balance;Proper use of DME;Strengthening/ROM         End of Session Equipment Utilized During Treatment: Gait belt Activity Tolerance: Patient limited by fatigue Patient left: in chair;with call bell/phone within reach;with family/visitor present Nurse Communication: Mobility status         Time: 1400-1446 PT Time Calculation (min) (ACUTE ONLY): 46 min   Charges:   PT Evaluation $Initial PT Evaluation Tier I: 1 Procedure PT Treatments $Gait Training: 8-22 mins $Neuromuscular Re-education: 8-22 mins   PT G Codes:          Melinna Linarez,CYNDI Aug 21, 2014, 4:41 PM Magda Kiel, [place] 08-21-2014

## 2014-07-30 NOTE — Progress Notes (Signed)
Speech Language Pathology Treatment: Dysphagia  Patient Details Name: Sherry Lambert MRN: [id number] DOB: 06-08-1941 Today's Date: 07/30/2014 Time: 1941-7408 SLP Time Calculation (min) (ACUTE ONLY): 12 min  Assessment / Plan / Recommendation Clinical Impression  F/u diagnostic treatment complete. Patient alert and without evidence of right sided oral weakness today, moderate sensation loss on the right continues. Patient able to adequately masticate trials of regular texture solids, thin liquids with full oral clearance and independent use of safe swallowing strategies including slow rate and monitoring for buccal pocketing. No overt s/s of aspiration noted. Educated patient and son regarding recommendations for diet upgrade as well as continued use of general safe swallowing precautions. SLP will continue to f/u briefly for tolerance prior to d/c.   HPI HPI: Sherry Lambert is a 73 y.o. female with hypertension, peripheral vascular disease, diabetes mellitus, peripheral arterial disease and COPD experienced acute onset of weakness and numbness involving the right side at 6:45 PM 07/28/2014. 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 PMH also significant for esophageal stretching and GERD.   Pertinent Vitals Pain Assessment: No/denies pain  SLP Plan  Continue with current plan of care    Recommendations Diet recommendations: Regular;Thin liquid Liquids provided via: Cup;Straw Medication Administration: Whole meds with liquid Supervision: Patient able to self feed;Intermittent supervision to cue for compensatory strategies Compensations: Slow rate;Small sips/bites Postural Changes and/or Swallow Maneuvers: Seated upright 90 degrees;Upright 30-60 min after meal              General recommendations: Rehab consult Oral Care Recommendations: Oral care BID Follow up Recommendations: Inpatient Rehab Plan: Continue with current plan of care     [place], Ashley ([place] 07/30/2014, 10:35 AM

## 2014-07-30 NOTE — Plan of Care (Signed)
Problem: Progression Outcomes Goal: Tolerating diet/TF at goal rate Outcome: Completed/Met Date Met:  07/30/14

## 2014-07-30 NOTE — Progress Notes (Signed)
UR COMPLETED  

## 2014-07-30 NOTE — Consult Note (Signed)
Physical Medicine and Rehabilitation Consult Reason for Consult:right sided weakness after stroke Referring Physician: Leonie Man   HPI: Sherry Lambert is a 73 y.o. female   with hypertension, peripheral vascular disease, diabetes mellitus, peripheral arterial disease and COPD experienced acute onset of weakness and numbness involving the right side on 07/28/2014.   She had been taking aspirin daily. 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   Blood pressure was elevated requiring immediate intervention with labetalol IV. NIH stroke score was 5. Patient was not administered TPA secondary to hemorrhage. She was admitted to the neuro ICU for further evaluation and treatment. Patient has not yet received therapy    Review of Systems  Constitutional: Negative for fever and chills.  HENT: Negative for hearing loss.   Eyes: Negative for blurred vision, double vision and photophobia.  Respiratory: Negative for cough.   Cardiovascular: Negative for chest pain and palpitations.  Gastrointestinal: Negative for nausea and vomiting.  Genitourinary: Negative for dysuria.  Musculoskeletal: Negative for myalgias.  Skin: Negative for itching.  Neurological: Positive for tingling, sensory change and focal weakness. Negative for headaches.   Past Medical History  Diagnosis Date  . Emphysema of lung   . GERD (gastroesophageal reflux disease)   . Hypertension   . [name]   . [name]   . Peripheral arterial disease   . Diabetes mellitus without complication    Past Surgical History  Procedure Laterality Date  . Tonsillectomy    . Abdominal hysterectomy    . Colonoscopy    . Esophagogastroduodenoscopy N/A 12/23/2012    Procedure: ESOPHAGOGASTRODUODENOSCOPY (EGD);  Surgeon: Inda Castle, MD;  Location: [facility];  Service: Endoscopy;  Laterality: N/A;  . Balloon  dilation N/A 12/23/2012    Procedure: BALLOON DILATION;  Surgeon: Inda Castle, MD;  Location: [facility];  Service: Endoscopy;  Laterality: N/A;  . Esophagogastroduodenoscopy N/A 01/15/2013    Procedure: ESOPHAGOGASTRODUODENOSCOPY (EGD);  Surgeon: Inda Castle, MD;  Location: [facility];  Service: Endoscopy;  Laterality: N/A;  . Balloon dilation N/A 01/15/2013    Procedure: BALLOON DILATION;  Surgeon: Inda Castle, MD;  Location: [facility];  Service: Endoscopy;  Laterality: N/A;   Family History  Problem Relation Age of Onset  . Hypertension Father   . Diabetes Father   . Cancer Neg Hx   . Early death Neg Hx   . Stroke Neg Hx    Social History:  reports that she has quit smoking. Her smoking use included Cigarettes. She smoked 0.00 packs per day for 55 years. She has never used smokeless tobacco. She reports that she does not drink alcohol or use illicit drugs. Allergies: No Known Allergies Medications Prior to Admission  Medication Sig Dispense Refill  . aspirin 81 MG tablet Take 81 mg by mouth daily.    . Fluticasone-Salmeterol (ADVAIR) 100-50 MCG/DOSE AEPB Inhale 1 puff into the lungs 2 (two) times daily. 1 each 3  . lisinopril (PRINIVIL,ZESTRIL) 10 MG tablet Take 1 tablet (10 mg total) by mouth daily. 90 tablet 3    Home: Home Living Family/patient expects to be discharged to:: Private residence  Functional History:   Functional Status:  Mobility:          ADL:    Cognition: Cognition Orientation Level: Oriented X4    Blood pressure 147/62, pulse 69, temperature 98.3 F (36.8 C), temperature  source Oral, resp. rate 17, height 5' (1.524 m), weight 61.236 kg (135 lb), SpO2 98 %. Physical Exam  Constitutional: She is oriented to person, place, and time. She appears well-developed and well-nourished.  HENT:  Head: Normocephalic and atraumatic.  Right Ear: External ear normal.  Left Ear: External ear normal.  Eyes: Conjunctivae and EOM are normal.  Pupils are equal, round, and reactive to light.  Neck: No JVD present. No tracheal deviation present. No thyromegaly present.  Cardiovascular: Normal rate.   Respiratory: No respiratory distress.  GI: She exhibits no distension. There is no tenderness.  Musculoskeletal: Normal range of motion.  Lymphadenopathy:    She has no cervical adenopathy.  Neurological: She is alert and oriented to person, place, and time. No cranial nerve deficit.  Appropriate with good insight and awareness. Speech clear. LUE 5/5. RUE: 2+ deltoid, bicep, 3- tricep, 3 wrist and hand. RLE: 3+ HF, 4 ke an adf/apf. Sensation 1/2 RUE and tr to 1/2 RLE. DTR's trace.   Psychiatric: She has a normal mood and affect. Her behavior is normal. Thought content normal.    Results for orders placed or performed during the hospital encounter of 07/28/14 (from the past 24 hour(s))  Lipid panel     Status: None   Collection Time: 07/30/14  2:05 AM  Result Value Ref Range   Cholesterol 161 0 - 200 mg/dL   Triglycerides 72 <150 mg/dL   HDL 64 >39 mg/dL   Total CHOL/HDL Ratio 2.5 RATIO   VLDL 14 0 - 40 mg/dL   LDL Cholesterol 83 0 - 99 mg/dL   Ct Head Wo Contrast  07/29/2014   CLINICAL DATA:  73 year old female with acute right side weakness, increasing upper extremity weakness. Found have acute left deep gray matter hemorrhage on CT yesterday. Initial encounter.  EXAM: CT HEAD WITHOUT CONTRAST  TECHNIQUE: Contiguous axial images were obtained from the base of the skull through the vertex without intravenous contrast.  COMPARISON:  07/28/2014.  FINDINGS: Paranasal sinuses and mastoids are clear except for mild mucosal thickening in the left sphenoid sinus. No acute orbit or scalp soft tissue findings. No acute osseous abnormality identified. Calcified atherosclerosis at the skull base.  Mildly increased size of the left dorsal thalamus hyperdense hemorrhage, now 19 x 25 mm (previously 17 x 18 mm). Estimated volume now 6 mL. Secondary  extension into the atrium of the left lateral ventricle re - identified. Mildly increased intraventricular hemorrhage layering in the left occipital horn. No ventriculomegaly. No other extra-axial extension identified.  Stable gray-white matter differentiation elsewhere, multifocal subcortical white matter hypodensity. No midline shift. Patent basilar cisterns. No new cortically based infarct identified. No suspicious intracranial vascular hyperdensity.  IMPRESSION: 1. Mildly increased size of the intra-axial hemorrhage located in the left thalamus near the posterior limb of the left internal capsule. Mild regional mass effect is stable. 2. Small volume left lateral intraventricular extension, no ventriculomegaly. 3. No new intracranial abnormality identified.   Electronically Signed   By: Lars Pinks M.D.   On: 07/29/2014 18:06   Ct Head (brain) Wo Contrast  07/28/2014   CLINICAL DATA:  Code stroke. Right-sided weakness. History of hypertension.  EXAM: CT HEAD WITHOUT CONTRAST  TECHNIQUE: Contiguous axial images were obtained from the base of the skull through the vertex without intravenous contrast.  COMPARISON:  None.  FINDINGS: There is an 18 mm acute hemorrhage noted in the area of the left thalamus and posterior limb of the internal capsule. There is a  small amount of blood also noted in the adjacent left lateral ventricle. No hydrocephalus. No mass effect or midline shift.  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 No acute ischemic infarct. No extra-axial fluid collection.  No acute calvarial abnormality.  IMPRESSION: Acute left delay I can't posterior limb of the internal capsule hemorrhage measuring 18 mm. Intraventricular extension.  Critical Value/emergent results were called by telephone at the time of interpretation on 07/28/2014 at 7:42 pm to Dr. Doy Mince , who verbally acknowledged these results.   Electronically Signed   By: Rolm Baptise M.D.   On: 07/28/2014 19:43   Chest  Port 1 View  07/29/2014   CLINICAL DATA:  Short of breath.  History of COPD.  EXAM: PORTABLE CHEST - 1 VIEW  COMPARISON:  None.  FINDINGS: The heart size and mediastinal contours are within normal limits. There is a small hiatal hernia noted. Both lungs are clear. The visualized skeletal structures are unremarkable.  IMPRESSION: No active disease.   Electronically Signed   By: Kerby Moors M.D.   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 Does the need for close, 24 hr/day medical supervision in concert with the patient's rehab needs make it unreasonable for this patient to be served in a less intensive setting? Yes 2. Co-Morbidities requiring supervision/potential complications: htn, dm 3. Due to bladder management, bowel management, safety, skin/wound care, disease management, medication administration, pain management and patient education, does the patient require 24 hr/day rehab nursing? Yes 4. Does the patient require coordinated care of a physician, rehab nurse, PT (1-2 hrs/day, 5 days/week) and OT (1-2 hrs/day, 5 days/week) to address physical and functional deficits in the context of the above medical diagnosis(es)? Yes and Potentially Addressing deficits in the following areas: balance, endurance, locomotion, strength, transferring, bowel/bladder control, bathing, dressing, feeding, grooming, toileting and psychosocial support 5. Can the patient actively participate in an intensive therapy program of at least 3 hrs of therapy per day at least 5 days per week? Yes 6. The potential for patient to make measurable gains while on inpatient rehab is excellent 7. Anticipated functional outcomes upon discharge from inpatient rehab are modified independent and supervision  with PT, modified independent and supervision with OT, n/a with SLP. 8. Estimated rehab length of stay to reach the above functional goals is: potentially 9-15  days 9. Does the patient have adequate social supports and living environment to accommodate these discharge functional goals? Yes and Potentially 10. Anticipated D/C setting: Home 11. Anticipated post D/C treatments: HH therapy and Outpatient therapy 12. Overall Rehab/Functional Prognosis: excellent  RECOMMENDATIONS: This patient's condition is appropriate for continued rehabilitative care in the following setting: CIR Patient has agreed to participate in recommended program. Yes Note that insurance prior authorization may be required for reimbursement for recommended care.  Comment: Await therapy evals. Given the extent of her right hemisensory loss and right hemiparesis, I anticipate the need for inpatient rehab. Rehab Admissions Coordinator to follow up.  Thanks,  Meredith Staggers, MD, Mellody Drown     07/30/2014

## 2014-07-30 NOTE — Progress Notes (Signed)
STROKE TEAM PROGRESS NOTE   HISTORY Sherry Lambert is an 73 y.o. female with hypertension, peripheral vascular disease, diabetes mellitus, peripheral arterial disease and COPD experienced acute onset of weakness and numbness involving the right side at 6:45 PM today 07/28/2014. She has no previous history of stroke nor TIA. She's been taking aspirin daily. 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 Patient did not experience a headache. Blood pressure was elevated requiring immediate intervention with labetalol IV. NIH stroke score was 5. Patient was not administered TPA secondary to hemorrhage. She was admitted to the neuro ICU for further evaluation and treatment.   SUBJECTIVE (INTERVAL HISTORY) Son is at the bedside.  Overall she feels her condition is gradually improving. BP well controlled. F/U CT head minima increase in hematoma size but no midline shift or hydrocephalus.   OBJECTIVE Temp:  [98 F (36.7 C)-99.3 F (37.4 C)] 98.3 F (36.8 C) (11/27 0800) Pulse Rate:  [63-91] 69 (11/27 0700) Cardiac Rhythm:  [-] Normal sinus rhythm (11/26 2000) Resp:  [15-23] 17 (11/27 0700) BP: (126-169)/(45-142) 147/62 mmHg (11/27 0700) SpO2:  [95 %-98 %] 98 % (11/27 0855)  No results for input(s): GLUCAP in the last 168 hours.  Recent Labs Lab 07/28/14 1926 07/28/14 1959  NA 142 143  K 4.0 3.8  CL 104 104  CO2 25  --   GLUCOSE 112* 113*  BUN 13 14  CREATININE 0.87 0.90  CALCIUM 9.5  --     Recent Labs Lab 07/28/14 1926  AST 18  ALT 15  ALKPHOS 90  BILITOT <0.2*  PROT 7.4  ALBUMIN 3.9    Recent Labs Lab 07/28/14 1926 07/28/14 1959  WBC 10.2  --   NEUTROABS 6.2  --   HGB 14.7 16.3*  HCT 45.8 48.0*  MCV 91.6  --   PLT 272  --    No results for input(s): CKTOTAL, CKMB, CKMBINDEX, TROPONINI in the last 168 hours.  Recent Labs  07/28/14 1926  LABPROT 13.4  INR 1.01    Recent Labs  07/29/14 0308  COLORURINE  YELLOW  LABSPEC 1.020  PHURINE 6.0  GLUCOSEU NEGATIVE  HGBUR SMALL*  BILIRUBINUR NEGATIVE  KETONESUR NEGATIVE  PROTEINUR NEGATIVE  UROBILINOGEN 0.2  NITRITE POSITIVE*  LEUKOCYTESUR MODERATE*       Component Value Date/Time   CHOL 161 07/30/2014 0205   TRIG 72 07/30/2014 0205   HDL 64 07/30/2014 0205   CHOLHDL 2.5 07/30/2014 0205   VLDL 14 07/30/2014 0205   LDLCALC 83 07/30/2014 0205   Lab Results  Component Value Date   HGBA1C 6.7* 11/12/2013      Component Value Date/Time   LABOPIA NONE DETECTED 07/29/2014 0308   COCAINSCRNUR NONE DETECTED 07/29/2014 0308   LABBENZ NONE DETECTED 07/29/2014 0308   AMPHETMU NONE DETECTED 07/29/2014 0308   THCU NONE DETECTED 07/29/2014 0308   LABBARB NONE DETECTED 07/29/2014 0308     Recent Labs Lab 07/28/14 1936  ETH <11    Ct Head Wo Contrast  07/29/2014   CLINICAL DATA:  73 year old female with acute right side weakness, increasing upper extremity weakness. Found have acute left deep gray matter hemorrhage on CT yesterday. Initial encounter.  EXAM: CT HEAD WITHOUT CONTRAST  TECHNIQUE: Contiguous axial images were obtained from the base of the skull through the vertex without intravenous contrast.  COMPARISON:  07/28/2014.  FINDINGS: Paranasal sinuses and mastoids are clear except for mild mucosal thickening in the left sphenoid  sinus. No acute orbit or scalp soft tissue findings. No acute osseous abnormality identified. Calcified atherosclerosis at the skull base.  Mildly increased size of the left dorsal thalamus hyperdense hemorrhage, now 19 x 25 mm (previously 17 x 18 mm). Estimated volume now 6 mL. Secondary extension into the atrium of the left lateral ventricle re - identified. Mildly increased intraventricular hemorrhage layering in the left occipital horn. No ventriculomegaly. No other extra-axial extension identified.  Stable gray-white matter differentiation elsewhere, multifocal subcortical white matter hypodensity. No midline  shift. Patent basilar cisterns. No new cortically based infarct identified. No suspicious intracranial vascular hyperdensity.  IMPRESSION: 1. Mildly increased size of the intra-axial hemorrhage located in the left thalamus near the posterior limb of the left internal capsule. Mild regional mass effect is stable. 2. Small volume left lateral intraventricular extension, no ventriculomegaly. 3. No new intracranial abnormality identified.   Electronically Signed   By: Lars Pinks M.D.   On: 07/29/2014 18:06   Ct Head (brain) Wo Contrast  07/28/2014   CLINICAL DATA:  Code stroke. Right-sided weakness. History of hypertension.  EXAM: CT HEAD WITHOUT CONTRAST  TECHNIQUE: Contiguous axial images were obtained from the base of the skull through the vertex without intravenous contrast.  COMPARISON:  None.  FINDINGS: There is an 18 mm acute hemorrhage noted in the area of the left thalamus and posterior limb of the internal capsule. There is a small amount of blood also noted in the adjacent left lateral ventricle. No hydrocephalus. No mass effect or midline shift.  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 No acute ischemic infarct. No extra-axial fluid collection.  No acute calvarial abnormality.  IMPRESSION: Acute left delay I can't posterior limb of the internal capsule hemorrhage measuring 18 mm. Intraventricular extension.  Critical Value/emergent results were called by telephone at the time of interpretation on 07/28/2014 at 7:42 pm to Dr. Doy Mince , who verbally acknowledged these results.   Electronically Signed   By: Rolm Baptise M.D.   On: 07/28/2014 19:43   Chest Port 1 View  07/29/2014   CLINICAL DATA:  Short of breath.  History of COPD.  EXAM: PORTABLE CHEST - 1 VIEW  COMPARISON:  None.  FINDINGS: The heart size and mediastinal contours are within normal limits. There is a small hiatal hernia noted. Both lungs are clear. The visualized skeletal structures are unremarkable.  IMPRESSION:  No active disease.   Electronically Signed   By: Kerby Moors M.D.   On: 07/29/2014 02:19     PHYSICAL EXAM Pleasant elderly caucasian lady not in distress. Awake alert. Afebrile. Head is nontraumatic. Neck is supple without bruit. Hearing is normal. Cardiac exam no murmur or gallop. Lungs are clear to auscultation. Distal pulses are well felt. Neurological Exam : Awake alert oriented 3 with normal speech and language function. Pupils are equal reactive. Fundi were not visualized. Vision acuity seems adequate. Visual fields are full to bedside confrontational testing. Mild right lower facial weakness. Tongue midline. Motor system exam revealed no upper or lower eczema to drift. Mild weakness of right grip and intrinsic hand muscles. Orbits left over right upper extremity. Symmetric strength in lower extremities.. Mild subjective diminished touch and pinprick sensation on the right lower face arm and leg. Coordination appears intact. Deep tendon reflexes are 2+ symmetric. Plantars are downgoing.Gait was not tested. ASSESSMENT/PLAN Ms. NERIAH BROTT is a 73 y.o. female with history of hypertension, peripheral vascular disease, diabetes mellitus, peripheral arterial disease and COPD presenting with right  side weakness and numbness. She did not receive IV t-PA due to hemorrhage.   Stroke:  Dominant left PLIC hemorrhage with IVH; hemorrhage felt to be secondary to hypertension given location, however BP not very elevated on admission  Resultant  Right hemiparesis and sensory deficit  MRI  Ordered for tomorrow  MRA  Ordered for tomorrow  Carotid Doppler  pending   2D Echo  pending   LDL pending   SCDs for VTE prophylaxis  DIET DYS 3 ,     aspirin 81 mg orally every day prior to admission  Ongoing aggressive stroke risk factor management  Therapy recommendations:  Pending. To eval today.obilize out of bed  Disposition:  Pending. Anticipate CIR.  Hypertension  Home meds:    Lisinopril  BP 180/86 on admission BP 109-180/48-92 past 24h (07/30/2014 @ 9:36 AM)  Stable BP goal < 160/90  Diabetes  HgbA1c pending  goal < 7.0  Other Stroke Risk Factors  Advanced age  Former Cigarette smoker  PAD  Hospital day # 2     I have personally examined this patient, reviewed notes, independently viewed imaging studies, participated in medical decision making and plan of care. I have made any additions or clarifications directly to the above note. Agree with note above.Transfer to floor bed  Today. Mobilize out of bed. Likely will need rehab [name], MD Medical Director [place] Pager: [phone] 07/30/2014 9:36 AM   To contact Stroke Continuity provider, please refer to [email or web address] After hours, contact General Neurology

## 2014-07-30 NOTE — Progress Notes (Signed)
Occupational Therapy Note  Pt requires assist for balance and awareness of Rt. Side    07/30/14 1800  OT Visit Information  Last OT Received On 07/30/14  Assistance Needed +2  History of Present Illness Sherry Lambert is a 73 y.o. female with hypertension, peripheral vascular disease, diabetes mellitus, peripheral arterial disease and COPD experienced acute onset of weakness and numbness involving the right side on 07/28/2014. She had been taking aspirin daily. 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 Blood pressure was elevated requiring immediate intervention with labetalol IV. NIH stroke score was 5. Patient was not administered TPA secondary to hemorrhage  OT Time Calculation  OT Start Time (ACUTE ONLY) 1528  OT Stop Time (ACUTE ONLY) 1536  OT Time Calculation (min) 8 min  Precautions  Precautions Fall  Cognition  Arousal/Alertness Awake/alert  Behavior During Therapy WFL for tasks assessed/performed  Overall Cognitive Status Impaired/Different from baseline  Area of Impairment Safety/judgement;Awareness;Problem solving  Safety/Judgement Decreased awareness of deficits  Awareness Intellectual  Problem Solving Slow processing;Decreased initiation;Difficulty sequencing;Requires verbal cues;Requires tactile cues  General Comments Pt with Rt neglect which impact the above as her awareness of the Rt side is impaired, and she requires verbal and tactile cues to initiate and sequence movement on the Rt side as well as with ADL tasks.  Min - mod cues for problem solving   ADL  General ADL Comments Family requested OT assist pt with repositioning in chair  Balance  Sitting balance-Leahy Scale Poor  Sitting balance - Comments static sitting with minguard to supervision at edge of bed, loss of balance with fatigue and dynamic reaching with min/mod assist   Postural control Right  lateral lean  Standing balance support Bilateral upper extremity supported  Standing balance-Leahy Scale Poor  Transfers  Overall transfer level Needs assistance  Equipment used 1 person hand held assist  Transfers Sit to/from Stand  Sit to Stand Mod assist  General transfer comment Performed sit to stand x 2 with mod A to reposition pt in recliner   OT - End of Session  Activity Tolerance Patient tolerated treatment well  Patient left in chair;with call bell/phone within reach;with chair alarm set  Nurse Communication Mobility status  OT Assessment/Plan  OT Frequency (ACUTE ONLY) Min 3X/week  Recommendations for Other Services Rehab consult  Follow Up Recommendations CIR;Supervision/Assistance - 24 hour  OT Equipment 3 in 1 bedside comode;Tub/shower bench  ADL Goals  Pt Will Perform Eating with min assist;sitting (using her Rt. UE)  Pt Will Perform Grooming with min assist;sitting (using Rt. UE)  Pt Will Perform Upper Body Bathing with supervision;sitting  Pt Will Perform Lower Body Bathing with min assist;sit to/from stand  Pt Will Perform Upper Body Dressing sitting;with supervision  Pt Will Perform Lower Body Dressing with min assist;sit to/from stand  Pt Will Transfer to Toilet with min assist;ambulating;regular height toilet;bedside commode;grab bars  Pt Will Perform Toileting - Clothing Manipulation and hygiene with min assist;sit to/from stand  OT General Charges  $OT Visit 1 Procedure  OT Treatments  $Neuromuscular Re-education 8-22 mins  [facility], OTR/L [phone]

## 2014-07-30 NOTE — Evaluation (Signed)
Speech Language Pathology Evaluation Patient Details Name: Sherry Lambert MRN: [id number] DOB: 28-Jun-1941 Today's Date: 07/30/2014 Time: 9211-9417 SLP Time Calculation (min) (ACUTE ONLY): 16 min  Problem List:  Patient Active Problem List   Diagnosis Date Noted  . Cerebral hemorrhage   . Cytotoxic brain edema   . Intracerebral hemorrhage 07/28/2014  . Hypertensive urgency 07/28/2014  . [name] (intracerebral hemorrhage) 07/28/2014  . Peripheral arterial disease 01/05/2014  . Osteopenia 11/12/2013  . Dyslipidemia 09/19/2013  . Stricture and stenosis of esophagus 12/23/2012  . Dysphagia, unspecified 12/01/2012  . [name] 12/01/2012  . DM type 2 goal A1C below 7.5 11/10/2012  . HTN (hypertension) 11/10/2012  . Other emphysema 11/10/2012   Past Medical History:  Past Medical History  Diagnosis Date  . Emphysema of lung   . GERD (gastroesophageal reflux disease)   . Hypertension   . [name]   . [name]   . Peripheral arterial disease   . Diabetes mellitus without complication    Past Surgical History:  Past Surgical History  Procedure Laterality Date  . Tonsillectomy    . Abdominal hysterectomy    . Colonoscopy    . Esophagogastroduodenoscopy N/A 12/23/2012    Procedure: ESOPHAGOGASTRODUODENOSCOPY (EGD);  Surgeon: Inda Castle, MD;  Location: [facility];  Service: Endoscopy;  Laterality: N/A;  . Balloon dilation N/A 12/23/2012    Procedure: BALLOON DILATION;  Surgeon: Inda Castle, MD;  Location: [facility];  Service: Endoscopy;  Laterality: N/A;  . Esophagogastroduodenoscopy N/A 01/15/2013    Procedure: ESOPHAGOGASTRODUODENOSCOPY (EGD);  Surgeon: Inda Castle, MD;  Location: [facility];  Service: Endoscopy;  Laterality: N/A;  . Balloon dilation N/A 01/15/2013    Procedure: BALLOON DILATION;  Surgeon: Inda Castle, MD;  Location: [facility];  Service: Endoscopy;  Laterality: N/A;   HPI:  Sherry Lambert is a 73 y.o.  female with hypertension, peripheral vascular disease, diabetes mellitus, peripheral arterial disease and COPD experienced acute onset of weakness and numbness involving the right side at 6:45 PM 07/28/2014. 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 PMH also significant for esophageal stretching and GERD.   Assessment / Plan / Recommendation Clinical Impression  Patient presents with what appears to be intact cognitive-linguistic skills with no evidence of dysarthria, aphasia, or cognitive deficits during today's evaluation. Patient does however report "confusion" which occurs in the evening only since admission. No skilled SLP f/u indicated on the acute setting however patient would benefit from being monitored for evening changes in cognitive function as well as possible f/u for high level cognition on CIR if needed.     SLP Assessment  Patient does not need any further Speech Lanaguage Pathology Services    Follow Up Recommendations  Inpatient Rehab       Pertinent Vitals/Pain Pain Assessment: No/denies pain   SLP Goals  Progression toward goals: Progressing toward goals Patient/Family Stated Goal: none stated  SLP Evaluation Prior Functioning  Cognitive/Linguistic Baseline: Within functional limits Type of Home: House  Lives With: Alone   Cognition  Overall Cognitive Status: Within Functional Limits for tasks assessed (although reports evening confusion) Orientation Level: Oriented X4    Comprehension  Auditory Comprehension Overall Auditory Comprehension: Appears within functional limits for tasks assessed Visual Recognition/Discrimination Discrimination: Within Function Limits Reading Comprehension Reading Status: Within funtional limits    Expression Expression Primary Mode of Expression: Verbal Verbal Expression Overall Verbal Expression: Appears within functional  limits for tasks assessed Written  Expression Dominant Hand: Right Written Expression: Unable to assess (comment) (right hand weakness/sensation loss)   Oral / Motor Oral Motor/Sensory Function Overall Oral Motor/Sensory Function: Impaired Labial ROM: Within Functional Limits Labial Symmetry: Within Functional Limits Labial Strength: Within Functional Limits Labial Sensation: Reduced Lingual ROM: Within Functional Limits Lingual Symmetry: Within Functional Limits Lingual Strength: Within Functional Limits Lingual Sensation: Reduced Facial ROM: Within Functional Limits Facial Symmetry: Within Functional Limits Facial Strength: Within Functional Limits Facial Sensation: Reduced Velum: Within Functional Limits Mandible: Within Functional Limits Motor Speech Overall Motor Speech: Appears within functional limits for tasks assessed   [place], [place] [phone]   Gabriel Rainwater Meryl 07/30/2014, 10:42 AM

## 2014-07-30 NOTE — Progress Notes (Signed)
  Echocardiogram 2D Echocardiogram has been performed.  Sherry Lambert 07/30/2014, 12:41 PM

## 2014-07-30 NOTE — Plan of Care (Signed)
Problem: Progression Outcomes Goal: If vent dependent, tolerates weaning Outcome: Not Applicable Date Met:  78/95/01

## 2014-07-30 NOTE — Evaluation (Signed)
Occupational Therapy Evaluation Patient Details Name: Sherry Lambert MRN: [id number] DOB: 06-17-1941 Today's Date: 07/30/2014    History of Present Illness Sherry Lambert is a 73 y.o. female with hypertension, peripheral vascular disease, diabetes mellitus, peripheral arterial disease and COPD experienced acute onset of weakness and numbness involving the right side on 07/28/2014. She had been taking aspirin daily. 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 Blood pressure was elevated requiring immediate intervention with labetalol IV. NIH stroke score was 5. Patient was not administered TPA secondary to hemorrhage   Clinical Impression   Pt admitted with above. She demonstrates the below listed deficits and will benefit from continued OT to maximize safety and independence with BADLs.  Pt presents to OT with Rt. Neglect/inattention; impaired sensation Rt. UE;  Impaired safety, problem solving, and sequencing with BADLs, and impaired balance.  She requires mod - max A with BADLs.  Family is very supportive.  Recommend CIR.       Follow Up Recommendations  CIR;Supervision/Assistance - 24 hour    Equipment Recommendations  3 in 1 bedside comode;Tub/shower bench    Recommendations for Other Services Rehab consult     Precautions / Restrictions Precautions Precautions: Fall Restrictions Weight Bearing Restrictions: No      Mobility Bed Mobility                  Transfers Overall transfer level: Needs assistance   Transfers: Sit to/from Stand;Stand Pivot Transfers Sit to Stand: Mod assist;+2 physical assistance Stand pivot transfers: Mod assist;+2 physical assistance       General transfer comment: Pt requires assist for placement and control of Rt. UE    Balance Overall balance assessment: Needs assistance Sitting-balance support: Feet supported;Single  extremity supported Sitting balance-Leahy Scale: Poor Sitting balance - Comments: Pt able to maintain static sitting min guard assist - occasional min A, but loses balance to Rt. with dynamic tasks - requires min guard - mod A for dynamic balance.  Postural control: Right lateral lean;Posterior lean Standing balance support: Bilateral upper extremity supported Standing balance-Leahy Scale: Poor                              ADL Overall ADL's : Needs assistance/impaired Eating/Feeding: Sitting;Set up Eating/Feeding Details (indicate cue type and reason): using Lt. UE; max A with Rt. UE Grooming: Wash/dry hands;Wash/dry face;Oral care;Brushing hair;Minimal assistance;Sitting Grooming Details (indicate cue type and reason): using Lt. UE Upper Body Bathing: Moderate assistance;Sitting   Lower Body Bathing: Maximal assistance;Sit to/from stand Lower Body Bathing Details (indicate cue type and reason): Assist to bathe below knees, for perianal area and mod - max A for standing balance  Upper Body Dressing : Maximal assistance;Sitting   Lower Body Dressing: Total assistance;Sit to/from stand   Toilet Transfer: Moderate assistance;+2 for physical assistance;Stand-pivot Toilet Transfer Details (indicate cue type and reason): Pt with no awareness of Rt. LE, which often buckles  under her, or she will forget to advance it during pivot or ambulation  Toileting- Clothing Manipulation and Hygiene: Total assistance;Sit to/from stand Toileting - Clothing Manipulation Details (indicate cue type and reason): Pt reliant on UE support to stand and not able to release one hand to attempt to wipe.  Unable to maintain balance and position self to access peri are in sitting      Functional mobility during ADLs:  Moderate assistance;+2 for physical assistance;Rolling walker General ADL Comments: Pt requires mod cues to use and incorporate Rt. UE into activities, and to attend to Rt. UE and LE while  performing ADLs     Vision Eye Alignment: Within Functional Limits   Ocular Range of Motion: Within Functional Limits Tracking/Visual Pursuits: Other (comment)         Additional Comments: Pt loses object at times on the Rt, but has to initiate a saccade frequently.   Pt with iniattention to the Rt visual field during functional activity    Perception Perception Perception Tested?: Yes Perception Deficits: Inattention/neglect Inattention/Neglect: Does not attend to right visual field;Does not attend to right side of body   Praxis Praxis Praxis tested?: Within functional limits    Pertinent Vitals/Pain Pain Assessment: No/denies pain     Hand Dominance Right   Extremity/Trunk Assessment Upper Extremity Assessment Upper Extremity Assessment: RUE deficits/detail RUE Deficits / Details: Pt demonstrates full AROM; but severe sensory loss.  She is able to reach for and drink from cup with verbal and tactile cues.  She requires use of visual compensation to successfully incorporate Rt. UE into activity  RUE Sensation: decreased light touch;decreased proprioception RUE Coordination: decreased fine motor;decreased gross motor   Lower Extremity Assessment Lower Extremity Assessment: Defer to PT evaluation   Cervical / Trunk Assessment Cervical / Trunk Assessment: Other exceptions Cervical / Trunk Exceptions: Pt with decrased activation of Rt. trunk during activities.  Unable to laterally flex to perform reciprocal scooting    Communication Communication Communication: No difficulties   Cognition Arousal/Alertness: Awake/alert Behavior During Therapy: WFL for tasks assessed/performed Overall Cognitive Status: Impaired/Different from baseline Area of Impairment: Safety/judgement;Awareness;Problem solving         Safety/Judgement: Decreased awareness of deficits Awareness: Intellectual Problem Solving: Slow processing;Decreased initiation;Difficulty sequencing;Requires  verbal cues;Requires tactile cues (with motor activities) General Comments: Pt with Rt neglect which impact the above as her awareness of the Rt side is impaired, and she requires verbal and tactile cues to initiate and sequence movement on the Rt side as well as with ADL tasks.  Min - mod cues for problem solving    General Comments       Exercises       Shoulder Instructions      Home Living Family/patient expects to be discharged to:: Private residence Living Arrangements: Alone Available Help at Discharge: Family;Available 24 hours/day Type of Home: House Home Access: Stairs to enter [employer] of Steps: 2   Home Layout: One level     Bathroom Shower/Tub: Tub/shower unit;Walk-in shower   Bathroom Toilet: Standard     Home Equipment: None   Additional Comments: daughter in law reports they have access to shower seat and RW.  Family lives behind and beside her and are able to provide 24 hour assist as needed at discharge.       Prior Functioning/Environment Level of Independence: Independent        Comments: Pt drives, and was independent in the community setting     OT Diagnosis: Generalized weakness;Cognitive deficits;Disturbance of vision;Hemiplegia dominant side   OT Problem List: Decreased strength;Decreased activity tolerance;Impaired balance (sitting and/or standing);Impaired vision/perception;Decreased coordination;Decreased cognition;Decreased safety awareness;Decreased knowledge of use of DME or AE;Impaired sensation;Impaired UE functional use   OT Treatment/Interventions: Self-care/ADL training;Neuromuscular education;DME and/or AE instruction;Therapeutic activities;Cognitive remediation/compensation;Visual/perceptual remediation/compensation;Patient/family education;Balance training    OT Goals(Current goals can be found in the care plan section) Acute Rehab OT Goals Patient Stated Goal: to go home  OT Goal Formulation: With patient Time For  Goal Achievement: 08/13/14 Potential to Achieve Goals: Fair ADL Goals Pt Will Perform Eating: with min assist;sitting (using her Rt. UE) Pt Will Perform Grooming: with min assist;sitting (using Rt. UE) Pt Will Perform Upper Body Bathing: with supervision;sitting Pt Will Perform Lower Body Bathing: with min assist;sit to/from stand Pt Will Perform Upper Body Dressing: sitting;with supervision Pt Will Perform Lower Body Dressing: with min assist;sit to/from stand Pt Will Transfer to Toilet: with min assist;ambulating;regular height toilet;bedside commode;grab bars Pt Will Perform Toileting - Clothing Manipulation and hygiene: with min assist;sit to/from stand  OT Frequency: Min 3X/week   Barriers to D/C:            Co-evaluation              End of Session Equipment Utilized During Treatment: Gait belt;Rolling walker Nurse Communication: Mobility status  Activity Tolerance: Patient tolerated treatment well Patient left: in chair;with call bell/phone within reach;with family/visitor present   Time: 1413-1445 OT Time Calculation (min): 32 min Charges:  OT General Charges $OT Visit: 1 Procedure OT Evaluation $Initial OT Evaluation Tier I: 1 Procedure OT Treatments $Neuromuscular Re-education: 8-22 mins G-Codes:    Ionna Avis M 07-31-14, 3:37 PM

## 2014-07-31 LAB — GLUCOSE, CAPILLARY
Glucose-Capillary: 111 mg/dL — ABNORMAL HIGH (ref 70–99)
Glucose-Capillary: 129 mg/dL — ABNORMAL HIGH (ref 70–99)

## 2014-07-31 MED ORDER — CEFTRIAXONE SODIUM IN DEXTROSE 20 MG/ML IV SOLN: 1.0000 g | INTRAVENOUS | Status: DC

## 2014-07-31 MED ORDER — CEFTRIAXONE SODIUM IN DEXTROSE 20 MG/ML IV SOLN
1.0000 g | INTRAVENOUS | Status: DC
  Administered 2014-07-31 – 2014-08-03 (×4): 1 g via INTRAVENOUS
  Filled 2014-07-31 (×4): qty 50

## 2014-07-31 NOTE — Plan of Care (Signed)
Problem: Progression Outcomes Goal: Bowel & Bladder Continence Outcome: Not Applicable Date Met:  83/07/46

## 2014-07-31 NOTE — Plan of Care (Signed)
Problem: Progression Outcomes Goal: Pain controlled Outcome: Completed/Met Date Met:  07/31/14     

## 2014-07-31 NOTE — Plan of Care (Signed)
Problem: Progression Outcomes Goal: Other Progression Outcomes Outcome: Completed/Met Date Met:  07/31/14

## 2014-07-31 NOTE — Plan of Care (Signed)
Problem: Progression Outcomes Goal: Rehab Team goals identified Outcome: Completed/Met Date Met:  07/31/14

## 2014-07-31 NOTE — Progress Notes (Signed)
STROKE TEAM PROGRESS NOTE   HISTORY Sherry Lambert is an 73 y.o. female with hypertension, peripheral vascular disease, diabetes mellitus, peripheral arterial disease and COPD experienced acute onset of weakness and numbness involving the right side at 6:45 PM today 07/28/2014. She has no previous history of stroke nor TIA. She's been taking aspirin daily. 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 Patient did not experience a headache. Blood pressure was elevated requiring immediate intervention with labetalol IV. NIH stroke score was 5.   Patient was not administered TPA secondary to hemorrhage. She was admitted to the neuro ICU for further evaluation and treatment.   SUBJECTIVE (INTERVAL HISTORY): She is feeling well today. Family is at the bedside. CIR recommended.     OBJECTIVE Temp:  [98.1 F (36.7 C)-98.9 F (37.2 C)] 98.1 F (36.7 C) (11/28 0958) Pulse Rate:  [76-85] 78 (11/28 0958) Cardiac Rhythm:  [-] Normal sinus rhythm (11/27 2100) Resp:  [18-22] 20 (11/28 0958) BP: (140-163)/(58-108) 160/94 mmHg (11/28 0958) SpO2:  [95 %-99 %] 95 % (11/28 0958)   Recent Labs Lab 07/28/14 1942 07/31/14 1107  GLUCAP 111* 129*    Recent Labs Lab 07/28/14 1926 07/28/14 1959  NA 142 143  K 4.0 3.8  CL 104 104  CO2 25  --   GLUCOSE 112* 113*  BUN 13 14  CREATININE 0.87 0.90  CALCIUM 9.5  --     Recent Labs Lab 07/28/14 1926  AST 18  ALT 15  ALKPHOS 90  BILITOT <0.2*  PROT 7.4  ALBUMIN 3.9    Recent Labs Lab 07/28/14 1926 07/28/14 1959  WBC 10.2  --   NEUTROABS 6.2  --   HGB 14.7 16.3*  HCT 45.8 48.0*  MCV 91.6  --   PLT 272  --    No results for input(s): CKTOTAL, CKMB, CKMBINDEX, TROPONINI in the last 168 hours.  Recent Labs  07/28/14 1926  LABPROT 13.4  INR 1.01    Recent Labs  07/29/14 0308  COLORURINE YELLOW  LABSPEC 1.020  PHURINE 6.0  GLUCOSEU NEGATIVE  HGBUR SMALL*  BILIRUBINUR  NEGATIVE  KETONESUR NEGATIVE  PROTEINUR NEGATIVE  UROBILINOGEN 0.2  NITRITE POSITIVE*  LEUKOCYTESUR MODERATE*       Component Value Date/Time   CHOL 161 07/30/2014 0205   TRIG 72 07/30/2014 0205   HDL 64 07/30/2014 0205   CHOLHDL 2.5 07/30/2014 0205   VLDL 14 07/30/2014 0205   LDLCALC 83 07/30/2014 0205   Lab Results  Component Value Date   HGBA1C 6.6* 07/30/2014      Component Value Date/Time   LABOPIA NONE DETECTED 07/29/2014 0308   COCAINSCRNUR NONE DETECTED 07/29/2014 0308   LABBENZ NONE DETECTED 07/29/2014 0308   AMPHETMU NONE DETECTED 07/29/2014 0308   THCU NONE DETECTED 07/29/2014 0308   LABBARB NONE DETECTED 07/29/2014 0308     Recent Labs Lab 07/28/14 1936  ETH <11    2-D echocardiogram 07/30/2014 LVEF 65-70%, mild LVH, normal wall motion, diastolic dysfunction with indeterminate LV filling pressure. Normal LA size, mild RAE, lipomatous hypertrophy of the IAS.    Ct Head Wo Contrast 07/29/2014    1. Mildly increased size of the intra-axial hemorrhage located in the left thalamus near the posterior limb of the left internal capsule. Mild regional mass effect is stable.  2. Small volume left lateral intraventricular extension, no ventriculomegaly.  3. No new intracranial abnormality identified.      Mr [facility]  Contrast 07/30/2014    1. Acute/subacute left lie make hemorrhage without evidence for an underlying mass.  2. Atrophy and moderate diffuse white matter disease.  3. 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  4. Moderate more distal PCA branch vessel disease bilaterally.        PHYSICAL EXAM Pleasant elderly caucasian lady not in distress. Awake alert. Afebrile. Head is nontraumatic. Neck is supple without bruit. Hearing is normal. Cardiac exam no murmur or gallop. Lungs are clear to auscultation. Distal pulses are well felt. Neurological Exam : Awake alert oriented 3  with normal speech and language function. Pupils are equal reactive. Fundi were not visualized.  Visual fields are full to bedside confrontational testing. Mild right lower facial weakness. Tongue midline. Motor system exam revealed no upper or lower drift. Mild weakness of right upper and lower extremities 3+-4/5. Mild subjective diminished touch and pinprick sensation on the right lower face arm and leg. Coordination appears intact, no dysmetria. Deep tendon reflexes are 2+ symmetric. Plantars are downgoing. Not able to test gait.     ASSESSMENT/PLAN Sherry Lambert is a 73 y.o. female with history of hypertension, peripheral vascular disease, diabetes mellitus, peripheral arterial disease and COPD presenting with right side weakness and numbness. She did not receive IV t-PA due to hemorrhage.   Stroke:  Dominant left PLIC hemorrhage with IVH; hemorrhage felt to be secondary to hypertension given location, however BP not very elevated on admission.   Resultant  Right hemiparesis and sensory deficit  MRI  as above  MRA  as above  Carotid Doppler - 1-39% internal carotid artery stenosis bilaterally. Unable to visualize the right vertebral artery.  2D Echo - unremarkable as above  LDL 83  Hemoglobin A1c - 6.6  SCDs for VTE prophylaxis  Diet regular ,     aspirin 81 mg orally every day prior to admission  Ongoing aggressive stroke risk factor management  Therapy recommendations:  CIR recommended  Disposition:  Patient evaluated by the rehabilitation physician. Felt to be a candidate for inpatient rehabilitation.  Hypertension  Home meds:   Lisinopril  BP 180/86 on admission BP 109-180/48-92 past 24h (07/31/2014 @ 11:36 AM)  Stable BP goal < 160/90  Diabetes  HgbA1c 6.6  goal < 7.0  Other Stroke Risk Factors  Advanced age  Former Cigarette smoker  PAD  Other Issues  UTI - IV Rocephin - culture sent.  Hospital day # [facility] Pager [phone] 07/31/2014, 11:36 AM  Personally examined patient and images, agree with history, physical, neuro exam as stated above. Agree with assessment and plan.   Sarina Ill, MD [facility]   To contact Stroke Continuity provider, please refer to [email or web address] After hours, contact General Neurology

## 2014-07-31 NOTE — Plan of Care (Signed)
Problem: Progression Outcomes Goal: Educational plan initiated Outcome: Completed/Met Date Met:  07/31/14

## 2014-07-31 NOTE — Plan of Care (Signed)
Problem: Progression Outcomes Goal: Initial discharge plan initiated Outcome: Completed/Met Date Met:  07/31/14

## 2014-07-31 NOTE — Plan of Care (Signed)
Problem: Progression Outcomes Goal: Progressive activity as tolerated Outcome: Completed/Met Date Met:  07/31/14

## 2014-08-01 LAB — BASIC METABOLIC PANEL
Anion gap: 13 (ref 5–15)
BUN: 13 mg/dL (ref 6–23)
CO2: 24 mEq/L (ref 19–32)
Calcium: 9.3 mg/dL (ref 8.4–10.5)
Chloride: 101 mEq/L (ref 96–112)
Creatinine, Ser: 0.75 mg/dL (ref 0.50–1.10)
GFR, EST NON AFRICAN AMERICAN: 83 mL/min — AB (ref >90)
GLUCOSE: 172 mg/dL — AB (ref 70–99)
POTASSIUM: 4 meq/L (ref 3.7–5.3)
SODIUM: 138 meq/L (ref 137–147)

## 2014-08-01 NOTE — Progress Notes (Signed)
STROKE TEAM PROGRESS NOTE   HISTORY Sherry Lambert is an 73 y.o. female with hypertension, peripheral vascular disease, diabetes mellitus, peripheral arterial disease and COPD experienced acute onset of weakness and numbness involving the right side at 6:45 PM today 07/28/2014. She has no previous history of stroke nor TIA. She's been taking aspirin daily. 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 Patient did not experience a headache. Blood pressure was elevated requiring immediate intervention with labetalol IV. NIH stroke score was 5.   Patient was not administered TPA secondary to hemorrhage. She was admitted to the neuro ICU for further evaluation and treatment.   SUBJECTIVE (INTERVAL HISTORY): She is feeling well today. Family is at the bedside. CIR recommended. Discussed CIR and hopefully transfer on Monday.     OBJECTIVE Temp:  [97.9 F (36.6 C)-98.4 F (36.9 C)] 98.2 F (36.8 C) (11/29 0927) Pulse Rate:  [73-82] 73 (11/29 0927) Cardiac Rhythm:  [-]  Resp:  [18-22] 20 (11/29 0927) BP: (95-168)/(39-72) 153/58 mmHg (11/29 0927) SpO2:  [97 %-98 %] 97 % (11/29 0927)   Recent Labs Lab 07/28/14 1942 07/31/14 1107  GLUCAP 111* 129*    Recent Labs Lab 07/28/14 1926 07/28/14 1959  NA 142 143  K 4.0 3.8  CL 104 104  CO2 25  --   GLUCOSE 112* 113*  BUN 13 14  CREATININE 0.87 0.90  CALCIUM 9.5  --     Recent Labs Lab 07/28/14 1926  AST 18  ALT 15  ALKPHOS 90  BILITOT <0.2*  PROT 7.4  ALBUMIN 3.9    Recent Labs Lab 07/28/14 1926 07/28/14 1959  WBC 10.2  --   NEUTROABS 6.2  --   HGB 14.7 16.3*  HCT 45.8 48.0*  MCV 91.6  --   PLT 272  --    No results for input(s): CKTOTAL, CKMB, CKMBINDEX, TROPONINI in the last 168 hours. No results for input(s): LABPROT, INR in the last 72 hours. No results for input(s): COLORURINE, LABSPEC, [place], GLUCOSEU, HGBUR, BILIRUBINUR, KETONESUR, PROTEINUR, UROBILINOGEN,  NITRITE, LEUKOCYTESUR in the last 72 hours.  Invalid input(s): APPERANCEUR     Component Value Date/Time   CHOL 161 07/30/2014 0205   TRIG 72 07/30/2014 0205   HDL 64 07/30/2014 0205   CHOLHDL 2.5 07/30/2014 0205   VLDL 14 07/30/2014 0205   LDLCALC 83 07/30/2014 0205   Lab Results  Component Value Date   HGBA1C 6.6* 07/30/2014      Component Value Date/Time   LABOPIA NONE DETECTED 07/29/2014 0308   COCAINSCRNUR NONE DETECTED 07/29/2014 0308   LABBENZ NONE DETECTED 07/29/2014 0308   AMPHETMU NONE DETECTED 07/29/2014 0308   THCU NONE DETECTED 07/29/2014 0308   LABBARB NONE DETECTED 07/29/2014 0308     Recent Labs Lab 07/28/14 1936  ETH <11    2-D echocardiogram 07/30/2014 LVEF 65-70%, mild LVH, normal wall motion, diastolic dysfunction with indeterminate LV filling pressure. Normal LA size, mild RAE, lipomatous hypertrophy of the IAS.    Ct Head Wo Contrast 07/29/2014    1. Mildly increased size of the intra-axial hemorrhage located in the left thalamus near the posterior limb of the left internal capsule. Mild regional mass effect is stable.  2. Small volume left lateral intraventricular extension, no ventriculomegaly.  3. No new intracranial abnormality identified.      Mr Jodene Nam Head Wo Contrast 07/30/2014    1. Acute/subacute left lie make hemorrhage without evidence for an underlying mass.  2. Atrophy and moderate diffuse white matter disease.  3. 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  4. Moderate more distal PCA branch vessel disease bilaterally.        PHYSICAL EXAM Pleasant elderly caucasian lady not in distress. Awake alert. Afebrile. Head is nontraumatic. Neck is supple without bruit. Hearing is normal. Cardiac exam no murmur or gallop. Lungs are clear to auscultation. Distal pulses are well felt. Neurological Exam : Awake alert oriented 3 with normal speech and language function.  Pupils are equal reactive. Fundi were not visualized.  Visual fields are full to bedside confrontational testing. Mild right lower facial weakness. Tongue midline. Motor system exam revealed no upper or lower drift. Mild weakness of right upper and lower extremities 3+-4/5. Mild subjective diminished touch and pinprick sensation on the right lower face arm and leg. Coordination appears intact, no dysmetria. Deep tendon reflexes are 2+ symmetric. Plantars are downgoing. Not able to test gait.     ASSESSMENT/PLAN Sherry Lambert is a 73 y.o. female with history of hypertension, peripheral vascular disease, diabetes mellitus, peripheral arterial disease and COPD presenting with right side weakness and numbness. She did not receive IV t-PA due to hemorrhage.   Stroke:  Dominant left PLIC hemorrhage with IVH; hemorrhage felt to be secondary to hypertension given location, however BP not very elevated on admission.   Resultant  Right hemiparesis and sensory deficit  MRI  as above  MRA  as above  Carotid Doppler - 1-39% internal carotid artery stenosis bilaterally. Unable to visualize the right vertebral artery.  2D Echo - unremarkable as above  LDL 83  Hemoglobin A1c - 6.6  SCDs for VTE prophylaxis  Diet regular ,     aspirin 81 mg orally every day prior to admission  Ongoing aggressive stroke risk factor management  Therapy recommendations:  CIR recommended  Disposition:  Patient evaluated by the rehabilitation physician. Felt to be a candidate for inpatient rehabilitation.  Hypertension  Home meds:   Lisinopril  BP 180/86 on admission BP 95-168/39-94 past 24h (08/01/2014 @ 10:55 AM)  BP Labile BP goal <140/90. Will order orthostatics as well as labs to evaluate fluid status. She is on Lisinopril 10mg  Daily.   Diabetes  HgbA1c 6.6  goal < 7.0  Other Stroke Risk Factors  Advanced age  Former Cigarette smoker  PAD  Other Issues  UTI - IV Rocephin - culture  pending.  Hospital day # [facility] Pager [phone] 08/01/2014, 10:55 AM  Personally examined patient and images, agree with history, physical, neuro exam as stated above. Agree with assessment and plan.   Sarina Ill, MD [facility]      To contact Stroke Continuity provider, please refer to [email or web address] After hours, contact General Neurology

## 2014-08-02 DIAGNOSIS — G936 Cerebral edema: Secondary | ICD-10-CM

## 2014-08-02 DIAGNOSIS — I619 Nontraumatic intracerebral hemorrhage, unspecified: Secondary | ICD-10-CM

## 2014-08-02 LAB — URINE CULTURE: Colony Count: >=100000

## 2014-08-02 NOTE — Progress Notes (Signed)
STROKE TEAM PROGRESS NOTE   HISTORY Sherry Lambert is an 73 y.o. female with hypertension, peripheral vascular disease, diabetes mellitus, peripheral arterial disease and COPD experienced acute onset of weakness and numbness involving the right side at 6:45 PM today 07/28/2014. She has no previous history of stroke nor TIA. She's been taking aspirin daily. 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 Patient did not experience a headache. Blood pressure was elevated requiring immediate intervention with labetalol IV. NIH stroke score was 5.   Patient was not administered TPA secondary to hemorrhage. She was admitted to the neuro ICU for further evaluation and treatment.   SUBJECTIVE (INTERVAL HISTORY)  Patient is up in chair. Son at bedside. Await insurance approval for rehab.    OBJECTIVE Temp:  [97.7 F (36.5 C)-98.6 F (37 C)] 97.9 F (36.6 C) (11/30 0659) Pulse Rate:  [71-87] 87 (11/30 0659) Cardiac Rhythm:  [-]  Resp:  [16-20] 16 (11/30 0659) BP: (139-157)/(59-97) 155/65 mmHg (11/30 0659) SpO2:  [92 %-100 %] 97 % (11/30 0841)   Recent Labs Lab 07/28/14 1942 07/31/14 1107  GLUCAP 111* 129*    Recent Labs Lab 07/28/14 1926 07/28/14 1959 08/01/14 1550  NA 142 143 138  K 4.0 3.8 4.0  CL 104 104 101  CO2 25  --  24  GLUCOSE 112* 113* 172*  BUN 13 14 13   CREATININE 0.87 0.90 0.75  CALCIUM 9.5  --  9.3    Recent Labs Lab 07/28/14 1926  AST 18  ALT 15  ALKPHOS 90  BILITOT <0.2*  PROT 7.4  ALBUMIN 3.9    Recent Labs Lab 07/28/14 1926 07/28/14 1959  WBC 10.2  --   NEUTROABS 6.2  --   HGB 14.7 16.3*  HCT 45.8 48.0*  MCV 91.6  --   PLT 272  --    No results for input(s): CKTOTAL, CKMB, CKMBINDEX, TROPONINI in the last 168 hours. No results for input(s): LABPROT, INR in the last 72 hours. No results for input(s): COLORURINE, LABSPEC, Burna, GLUCOSEU, HGBUR, BILIRUBINUR, KETONESUR, PROTEINUR,  UROBILINOGEN, NITRITE, LEUKOCYTESUR in the last 72 hours.  Invalid input(s): APPERANCEUR     Component Value Date/Time   CHOL 161 07/30/2014 0205   TRIG 72 07/30/2014 0205   HDL 64 07/30/2014 0205   CHOLHDL 2.5 07/30/2014 0205   VLDL 14 07/30/2014 0205   LDLCALC 83 07/30/2014 0205   Lab Results  Component Value Date   HGBA1C 6.6* 07/30/2014      Component Value Date/Time   LABOPIA NONE DETECTED 07/29/2014 0308   COCAINSCRNUR NONE DETECTED 07/29/2014 0308   LABBENZ NONE DETECTED 07/29/2014 0308   AMPHETMU NONE DETECTED 07/29/2014 0308   THCU NONE DETECTED 07/29/2014 0308   LABBARB NONE DETECTED 07/29/2014 0308     Recent Labs Lab 07/28/14 1936  ETH <11    2-D echocardiogram 07/30/2014 LVEF 65-70%, mild LVH, normal wall motion, diastolic dysfunction with indeterminate LV filling pressure. Normal LA size, mild RAE, lipomatous hypertrophy of the IAS.  Ct Head Wo Contrast 07/29/2014    1. Mildly increased size of the intra-axial hemorrhage located in the left thalamus near the posterior limb of the left internal capsule. Mild regional mass effect is stable.  2. Small volume left lateral intraventricular extension, no ventriculomegaly.  3. No new intracranial abnormality identified.     Mri Mra Head Wo Contrast 07/30/2014    1. Acute/subacute left thalamic hemorrhage without evidence for an underlying mass.  2. Atrophy and moderate diffuse white matter disease.  3. Moderate MCA branch vessel stenoses, including a proximal anterior division stenosis on the left.  4. Moderate more distal PCA branch vessel disease bilaterally.      PHYSICAL EXAM Pleasant elderly caucasian lady not in distress. Awake alert. Afebrile. Head is nontraumatic. Neck is supple without bruit. Hearing is normal. Cardiac exam no murmur or gallop. Lungs are clear to auscultation. Distal pulses are well felt. Neurological Exam : Awake alert oriented 3 with normal speech and language function. Pupils  are equal reactive. Fundi were not visualized.  Visual fields are full to bedside confrontational testing. Mild right lower facial weakness. Tongue midline. Motor system exam revealed no upper or lower drift. Mild weakness of right upper and lower extremities 3+-4/5. Mild subjective diminished touch and pinprick sensation on the right lower face arm and leg. Coordination appears intact, no dysmetria. Deep tendon reflexes are 2+ symmetric. Plantars are downgoing. Not able to test gait.   ASSESSMENT/PLAN Sherry Lambert is a 73 y.o. female with history of hypertension, peripheral vascular disease, diabetes mellitus, peripheral arterial disease and COPD presenting with right side weakness and numbness. She did not receive IV t-PA due to hemorrhage.   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  Resultant  Right hemiparesis and sensory deficit  MRI  Left thalamic hemorrhage with left IVH  MRA  Moderate intracranial atherosclerosis  Carotid Doppler - 1-39% internal carotid artery stenosis bilaterally. Unable to visualize the right vertebral artery.  2D Echo - unremarkable  LDL 83  Hemoglobin A1c - 6.6  SCDs for VTE prophylaxis  Diet regular thin liquids  aspirin 81 mg orally every day prior to admission  Ongoing aggressive stroke risk factor management  Therapy recommendations:  CIR recommended  Disposition:  Await inpatient rehabilitation bed with insurance approval.  Hypertension  Home meds:   Lisinopril  BP 180/86 on admission  BP 139-157/62-97 past 24h (08/02/2014 @ 5:15 PM)   Will increase SBP goal <180/90 as this is hospital day #5.  Saline lock IV  Diabetes  HgbA1c 6.6  At goal < 7.0  Other Stroke Risk Factors  Advanced age  Former Cigarette smoker  PAD  Other Issues  UTI - culture positive for Escherichia coli -  IV Rocephin D3/5.  Hospital day # 5  BIBY,SHARON  I have personally examined this patient, reviewed notes, independently viewed imaging studies, participated in medical decision making and plan of care. I have made any additions or clarifications directly to the above note. Agree with note above. Await rehab transfer  [name], [place] Pager: [phone] 08/02/2014 8:05 PM   To contact Stroke Continuity provider, please refer to [email or web address] After hours, contact General Neurology

## 2014-08-02 NOTE — Progress Notes (Signed)
Occupational Therapy Treatment Patient Details Name: Sherry Lambert MRN: [id number] DOB: 1941-06-02 Today's Date: 08/02/2014    History of present illness Sherry Lambert is a 73 y.o. female with hypertension, peripheral vascular disease, diabetes mellitus, peripheral arterial disease and COPD experienced acute onset of weakness and numbness involving the right side on 07/28/2014. She had been taking aspirin daily. 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 Blood pressure was elevated requiring immediate intervention with labetalol IV. NIH stroke score was 5. Patient was not administered TPA secondary to hemorrhage   OT comments  This 73 yo female seen today to focus on RUE use, ADLs, and transfers. She is making progress, but still has a ways to go, will greatly benefit from intense therapies on CIR to get her to a S level.  Follow Up Recommendations  CIR;Supervision/Assistance - 24 hour    Equipment Recommendations  3 in 1 bedside comode;Tub/shower bench    Recommendations for Other Services Rehab consult    Precautions / Restrictions Precautions Precautions: Fall Restrictions Weight Bearing Restrictions: No       Mobility Bed Mobility               General bed mobility comments: Patient sitting up in recliner before and after session  Transfers Overall transfer level: Needs assistance Equipment used: Hemi-walker Transfers: Sit to/from Stand Sit to Stand: Mod assist            Balance Overall balance assessment: Needs assistance Sitting-balance support: Feet supported;Single extremity supported Sitting balance-Leahy Scale: Fair (but have her perform a task and she looses balance posteriorly and to her right)     Standing balance support: Single extremity supported Standing balance-Leahy Scale: Poor (Zero if you have her move)                      ADL Overall ADL's : Needs assistance/impaired                       Lower Body Dressing Details (indicate cue type and reason): Pt able to doff socks, using RUE for one and LUE for other while seated in recliner and crossing one leg over the other. Pt could not get socks started for donning process, she would grab it with her RUE but as she reached forward with LUE and RUE she would lose grip on it. I donned both socks 1/2 way and she finished process up and over heel with legs crossed and increased VCs to really focusing on squeezing the sock hard with RUE   Toilet Transfer Details (indicate cue type and reason): Sit>stand from recliner Mod A , then we ambulated from recliner to toilet in bathroom with hemi-walker with pt's tendency to lean forward and she has to catapult her RLE forward with waxing and waning balance (Min -Max A) depending on what stage of ambulating she was in. Could not safely make in back from bathroom ambulating with +1 due to leaning right too much and could not self correct completely even with cues Toileting- Clothing Manipulation and Hygiene: Total assistance (with Mod A sit<.stand)                   Perception Perception Inattention/Neglect:  (spontaneously uses/move RUE at times, but not consisently unless cued and then with decreased coordination and proprioception)       Cognition  Behavior During Therapy: WFL for tasks assessed/performed Overall Cognitive Status: Within Functional Limits for tasks assessed Area of Impairment: Safety/judgement;Problem solving          Safety/Judgement: Decreased awareness of safety (decreased awareness that she leaning right )   Problem Solving: Difficulty sequencing;Requires verbal cues (for ambulation with hemi-walker) General Comments: Patient is more aware of R side this session and able to use with minimal cues                 Pertinent Vitals/ Pain       Pain Assessment: No/denies pain          Frequency Min 3X/week     Progress Toward Goals  OT Goals(current goals can now be found in the care plan section)  Progress towards OT goals: Progressing toward goals     Plan Discharge plan remains appropriate       End of Session Equipment Utilized During Treatment: Gait belt (hemi-walker)   Activity Tolerance Patient tolerated treatment well   Patient Left in chair;with call bell/phone within reach;with chair alarm set   Nurse Communication Mobility status        Time: 9811-9147 OT Time Calculation (min): 26 min  Charges: OT General Charges $OT Visit: 1 Procedure OT Treatments $Self Care/Home Management : 23-37 mins  Almon Register [phone] 08/02/2014, 3:04 PM

## 2014-08-02 NOTE — Progress Notes (Signed)
Physical Therapy Treatment Patient Details Name: Sherry Lambert MRN: [id number] DOB: [birth year] Today's Date: 08/02/2014    History of Present Illness Sherry Lambert is a 73 y.o. female with hypertension, peripheral vascular disease, diabetes mellitus, peripheral arterial disease and COPD experienced acute onset of weakness and numbness involving the right side on 07/28/2014. She had been taking aspirin daily. 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 Blood pressure was elevated requiring immediate intervention with labetalol IV. NIH stroke score was 5. Patient was not administered TPA secondary to hemorrhage    PT Comments    Patient able to progress with ambulation with use of hemiwalker today. Patient has no pain and is able to control R side movement better this session. Patient continues to be a great CIR candidate.   Follow Up Recommendations  CIR     Equipment Recommendations   (TBD)    Recommendations for Other Services       Precautions / Restrictions Precautions Precautions: Fall    Mobility  Bed Mobility               General bed mobility comments: Patient sitting up in recliner before and after session  Transfers Overall transfer level: Needs assistance Equipment used: Hemi-walker Transfers: Sit to/from Stand Sit to Stand: Mod assist         General transfer comment: Performed sit to stand x 2 with mod A to ensure balance and proper positioning with standing. Patient very close to only requiring min A.   Ambulation/Gait Ambulation/Gait assistance: Mod assist;+2 physical assistance Ambulation Distance (Feet): 50 Feet Assistive device: Hemi-walker Gait Pattern/deviations: Step-to pattern;Decreased weight shift to left;Decreased dorsiflexion - right;Ataxic;Wide base of support     General Gait Details: Patient able to move R foot on her own  this session. There were times that she went with the wrong gait sequence throwing herself off balance and requiring +2 mod A to prevent fall. Patient able to use hemi-walker very well with gait this session. R hand was slipping off RW and was unsafe for patient to use. Patient required two seated rest breaks   Stairs            Wheelchair Mobility    Modified Rankin (Stroke Patients Only) Modified Rankin (Stroke Patients Only) Pre-Morbid Rankin Score: No symptoms Modified Rankin: Moderately severe disability     Balance     Sitting balance-Leahy Scale: Fair       Standing balance-Leahy Scale: Poor                      Cognition Arousal/Alertness: Awake/alert Behavior During Therapy: WFL for tasks assessed/performed Overall Cognitive Status: Within Functional Limits for tasks assessed                 General Comments: Patient is more aware of R side this session and able to use with minimal cues    Exercises      General Comments        Pertinent Vitals/Pain Pain Assessment: No/denies pain    Home Living                      Prior Function            PT Goals (current goals can now be found in the care plan section) Progress towards PT goals: Progressing toward goals    Frequency  Min 4X/week    PT Plan Current plan remains appropriate    Co-evaluation             End of Session Equipment Utilized During Treatment: Gait belt Activity Tolerance: Patient tolerated treatment well Patient left: in chair;with call bell/phone within reach     Time: 1110-1134 PT Time Calculation (min) (ACUTE ONLY): 24 min  Charges:  $Gait Training: 8-22 mins $Therapeutic Activity: 8-22 mins                    G Codes:      Jacqualyn Posey 08/02/2014, 2:24 PM  08/02/2014 Jacqualyn Posey PTA [phone] pager [phone] office

## 2014-08-02 NOTE — Progress Notes (Signed)
CARE MANAGEMENT NOTE 08/02/2014  Patient:  Sherry Lambert, Sherry Lambert   Account Number:  [id number]  Date Initiated:  08/02/2014  Documentation initiated by:  [facility]  Subjective/Objective Assessment:   CVA     Action/Plan:   CIR recommended   Anticipated DC Date:  08/02/2014   Anticipated DC Plan:  IP REHAB FACILITY      DC Planning Services  CM consult      Choice offered to / List presented to:             Status of service:  Completed, signed off Medicare Important Message given?  YES (If response is "NO", the following Medicare IM given date fields will be blank) Date Medicare IM given:  08/02/2014 Medicare IM given by:  [facility] Date Additional Medicare IM given:   Additional Medicare IM given by:    Discharge Disposition:    Per UR Regulation:    If discussed at Long Length of Stay Meetings, dates discussed:    Comments:  08/02/2014 1015 Waiting CIR approval. Jonnie Finner RN CCM Case Mgmt phone [phone]

## 2014-08-02 NOTE — Progress Notes (Signed)
I met with pt at bedside. I will begin insurance approval for an inpt rehab admission with [employer]. Pt is in agreement. [phone]

## 2014-08-02 NOTE — Progress Notes (Signed)
Speech Language Pathology Treatment: Dysphagia  Patient Details Name: Sherry Lambert MRN: [id number] DOB: 05/19/1941 Today's Date: 08/02/2014 Time: 1207-1221 SLP Time Calculation (min) (ACUTE ONLY): 14 min  Assessment / Plan / Recommendation Clinical Impression  SLP provided skilled observation during lunch meal. Pt required Min cues from SLP for management of right-sided sensory deficits, including use of mirror to encourage self-monitoring. She coughed x1 while food was still in her mouth, however with Min A to cut her salad into more appropriate sized bites, pt demonstrated improved oral clearance. Recommend to continue current diet with intermittent supervision.    HPI HPI: Sherry Lambert is a 73 y.o. female with hypertension, peripheral vascular disease, diabetes mellitus, peripheral arterial disease and COPD experienced acute onset of weakness and numbness involving the right side at 6:45 PM 07/28/2014. 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 PMH also significant for esophageal stretching and GERD.   Pertinent Vitals Pain Assessment: No/denies pain  SLP Plan  Continue with current plan of care    Recommendations Diet recommendations: Regular;Thin liquid Liquids provided via: Cup;Straw Medication Administration: Whole meds with liquid Supervision: Patient able to self feed;Intermittent supervision to cue for compensatory strategies Compensations: Slow rate;Small sips/bites Postural Changes and/or Swallow Maneuvers: Seated upright 90 degrees;Upright 30-60 min after meal              Oral Care Recommendations: Oral care BID Follow up Recommendations: Inpatient Rehab Plan: Continue with current plan of care    GO      Germain Osgood, M.A. CCC-SLP [phone]  Germain Osgood 08/02/2014, 1:01 PM

## 2014-08-03 ENCOUNTER — Inpatient Hospital Stay (HOSPITAL_COMMUNITY)
Admission: RE | Admit: 2014-08-03 | Discharge: 2014-08-20 | DRG: 057 | Disposition: A | Discharge status: Home Health | Payer: Medicare HMO | Source: Intra-hospital | Attending: Physical Medicine & Rehabilitation | Admitting: Physical Medicine & Rehabilitation

## 2014-08-03 DIAGNOSIS — I10 Essential (primary) hypertension: Secondary | ICD-10-CM | POA: Diagnosis present

## 2014-08-03 DIAGNOSIS — E119 Type 2 diabetes mellitus without complications: Secondary | ICD-10-CM | POA: Diagnosis present

## 2014-08-03 DIAGNOSIS — E785 Hyperlipidemia, unspecified: Secondary | ICD-10-CM | POA: Diagnosis present

## 2014-08-03 DIAGNOSIS — B9620 Unspecified Escherichia coli [E. coli] as the cause of diseases classified elsewhere: Secondary | ICD-10-CM | POA: Diagnosis present

## 2014-08-03 DIAGNOSIS — K219 Gastro-esophageal reflux disease without esophagitis: Secondary | ICD-10-CM | POA: Diagnosis present

## 2014-08-03 DIAGNOSIS — E1165 Type 2 diabetes mellitus with hyperglycemia: Secondary | ICD-10-CM | POA: Diagnosis present

## 2014-08-03 DIAGNOSIS — R269 Unspecified abnormalities of gait and mobility: Secondary | ICD-10-CM | POA: Diagnosis present

## 2014-08-03 DIAGNOSIS — R209 Unspecified disturbances of skin sensation: Secondary | ICD-10-CM

## 2014-08-03 DIAGNOSIS — I69393 Ataxia following cerebral infarction: Secondary | ICD-10-CM | POA: Diagnosis present

## 2014-08-03 DIAGNOSIS — J449 Chronic obstructive pulmonary disease, unspecified: Secondary | ICD-10-CM | POA: Diagnosis present

## 2014-08-03 DIAGNOSIS — E118 Type 2 diabetes mellitus with unspecified complications: Secondary | ICD-10-CM

## 2014-08-03 DIAGNOSIS — I619 Nontraumatic intracerebral hemorrhage, unspecified: Secondary | ICD-10-CM | POA: Diagnosis present

## 2014-08-03 DIAGNOSIS — I69398 Other sequelae of cerebral infarction: Secondary | ICD-10-CM

## 2014-08-03 DIAGNOSIS — I739 Peripheral vascular disease, unspecified: Secondary | ICD-10-CM | POA: Diagnosis present

## 2014-08-03 DIAGNOSIS — Z8673 Personal history of transient ischemic attack (TIA), and cerebral infarction without residual deficits: Secondary | ICD-10-CM

## 2014-08-03 DIAGNOSIS — N390 Urinary tract infection, site not specified: Secondary | ICD-10-CM | POA: Diagnosis present

## 2014-08-03 DIAGNOSIS — Z87891 Personal history of nicotine dependence: Secondary | ICD-10-CM

## 2014-08-03 DIAGNOSIS — I613 Nontraumatic intracerebral hemorrhage in brain stem: Secondary | ICD-10-CM

## 2014-08-03 DIAGNOSIS — I69993 Ataxia following unspecified cerebrovascular disease: Secondary | ICD-10-CM

## 2014-08-03 LAB — CBC
HCT: 44.2 % (ref 36.0–46.0)
Hemoglobin: 14.1 g/dL (ref 12.0–15.0)
MCH: 29.1 pg (ref 26.0–34.0)
MCHC: 31.9 g/dL (ref 30.0–36.0)
MCV: 91.1 fL (ref 78.0–100.0)
Platelets: 282 10*3/uL (ref 150–400)
RBC: 4.85 MIL/uL (ref 3.87–5.11)
RDW: 13.9 % (ref 11.5–15.5)
WBC: 10.3 10*3/uL (ref 4.0–10.5)

## 2014-08-03 LAB — CREATININE, SERUM
CREATININE: 0.7 mg/dL (ref 0.50–1.10)
GFR calc Af Amer: >90 mL/min (ref >90)
GFR, EST NON AFRICAN AMERICAN: 85 mL/min — AB (ref >90)

## 2014-08-03 MED ORDER — ENOXAPARIN SODIUM 40 MG/0.4ML ~~LOC~~ SOLN: 40.0000 mg | SUBCUTANEOUS | Status: DC

## 2014-08-03 MED ORDER — PANTOPRAZOLE SODIUM 40 MG PO TBEC
40.0000 mg | DELAYED_RELEASE_TABLET | Freq: Every day | ORAL | Status: DC
  Administered 2014-08-03 – 2014-08-19 (×17): 40 mg via ORAL
  Filled 2014-08-03 (×17): qty 1

## 2014-08-03 MED ORDER — ONDANSETRON HCL 4 MG PO TABS: 4.0000 mg | ORAL_TABLET | Freq: Four times a day (QID) | ORAL | Status: DC | PRN

## 2014-08-03 MED ORDER — ENOXAPARIN SODIUM 40 MG/0.4ML ~~LOC~~ SOLN
40.0000 mg | SUBCUTANEOUS | Status: DC
  Administered 2014-08-03: 40 mg via SUBCUTANEOUS
  Filled 2014-08-03: qty 0.4

## 2014-08-03 MED ORDER — CEFTRIAXONE SODIUM IN DEXTROSE 20 MG/ML IV SOLN
1.0000 g | INTRAVENOUS | Status: DC
  Filled 2014-08-03: qty 50

## 2014-08-03 MED ORDER — SENNOSIDES-DOCUSATE SODIUM 8.6-50 MG PO TABS
1.0000 | ORAL_TABLET | Freq: Two times a day (BID) | ORAL | Status: DC
  Administered 2014-08-03 – 2014-08-20 (×16): 1 via ORAL
  Filled 2014-08-03 (×26): qty 1

## 2014-08-03 MED ORDER — ENOXAPARIN SODIUM 40 MG/0.4ML ~~LOC~~ SOLN
40.0000 mg | SUBCUTANEOUS | Status: DC
  Administered 2014-08-04 – 2014-08-19 (×16): 40 mg via SUBCUTANEOUS
  Filled 2014-08-03 (×17): qty 0.4

## 2014-08-03 MED ORDER — ACETAMINOPHEN 325 MG PO TABS
650.0000 mg | ORAL_TABLET | ORAL | Status: DC | PRN
  Administered 2014-08-12: 650 mg via ORAL
  Filled 2014-08-03 (×2): qty 2

## 2014-08-03 MED ORDER — SORBITOL 70 % SOLN: 30.0000 mL | Freq: Every day | Status: DC | PRN

## 2014-08-03 MED ORDER — ONDANSETRON HCL 4 MG/2ML IJ SOLN: 4.0000 mg | Freq: Four times a day (QID) | INTRAMUSCULAR | Status: DC | PRN

## 2014-08-03 MED ORDER — ACETAMINOPHEN 650 MG RE SUPP: 650.0000 mg | RECTAL | Status: DC | PRN

## 2014-08-03 MED ORDER — LISINOPRIL 10 MG PO TABS
10.0000 mg | ORAL_TABLET | Freq: Every day | ORAL | Status: DC
  Administered 2014-08-04 – 2014-08-20 (×17): 10 mg via ORAL
  Filled 2014-08-03 (×18): qty 1

## 2014-08-03 MED ORDER — MOMETASONE FURO-FORMOTEROL FUM 100-5 MCG/ACT IN AERO
2.0000 | INHALATION_SPRAY | Freq: Two times a day (BID) | RESPIRATORY_TRACT | Status: DC
  Administered 2014-08-03 – 2014-08-20 (×29): 2 via RESPIRATORY_TRACT
  Filled 2014-08-03 (×3): qty 8.8

## 2014-08-03 NOTE — Progress Notes (Signed)
I await [employer] approval to admit pt to inpt rehab today.[phone]

## 2014-08-03 NOTE — Progress Notes (Signed)
Physical Medicine and Rehabilitation Consult Reason for Consult:right sided weakness after stroke Referring Physician: Leonie Man   HPI: Sherry Lambert is a 73 y.o. female with hypertension, peripheral vascular disease, diabetes mellitus, peripheral arterial disease and COPD experienced acute onset of weakness and numbness involving the right side on 07/28/2014. She had been taking aspirin daily. 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 Blood pressure was elevated requiring immediate intervention with labetalol IV. NIH stroke score was 5. Patient was not administered TPA secondary to hemorrhage. She was admitted to the neuro ICU for further evaluation and treatment. Patient has not yet received therapy    Review of Systems  Constitutional: Negative for fever and chills.  HENT: Negative for hearing loss.  Eyes: Negative for blurred vision, double vision and photophobia.  Respiratory: Negative for cough.  Cardiovascular: Negative for chest pain and palpitations.  Gastrointestinal: Negative for nausea and vomiting.  Genitourinary: Negative for dysuria.  Musculoskeletal: Negative for myalgias.  Skin: Negative for itching.  Neurological: Positive for tingling, sensory change and focal weakness. Negative for headaches.   Past Medical History  Diagnosis Date  . Emphysema of lung   . GERD (gastroesophageal reflux disease)   . Hypertension   . [name]   . [name]   . Peripheral arterial disease   . Diabetes mellitus without complication    Past Surgical History  Procedure Laterality Date  . Tonsillectomy    . Abdominal hysterectomy    . Colonoscopy    . Esophagogastroduodenoscopy N/A 12/23/2012    Procedure: ESOPHAGOGASTRODUODENOSCOPY (EGD); Surgeon: Inda Castle, MD; Location: [facility]; Service: Endoscopy;  Laterality: N/A;  . Balloon dilation N/A 12/23/2012    Procedure: BALLOON DILATION; Surgeon: Inda Castle, MD; Location: [facility]; Service: Endoscopy; Laterality: N/A;  . Esophagogastroduodenoscopy N/A 01/15/2013    Procedure: ESOPHAGOGASTRODUODENOSCOPY (EGD); Surgeon: Inda Castle, MD; Location: [facility]; Service: Endoscopy; Laterality: N/A;  . Balloon dilation N/A 01/15/2013    Procedure: BALLOON DILATION; Surgeon: Inda Castle, MD; Location: [facility]; Service: Endoscopy; Laterality: N/A;   Family History  Problem Relation Age of Onset  . Hypertension Father   . Diabetes Father   . Cancer Neg Hx   . Early death Neg Hx   . Stroke Neg Hx    Social History:  reports that she has quit smoking. Her smoking use included Cigarettes. She smoked 0.00 packs per day for 55 years. She has never used smokeless tobacco. She reports that she does not drink alcohol or use illicit drugs. Allergies: No Known Allergies Medications Prior to Admission  Medication Sig Dispense Refill  . aspirin 81 MG tablet Take 81 mg by mouth daily.    . Fluticasone-Salmeterol (ADVAIR) 100-50 MCG/DOSE AEPB Inhale 1 puff into the lungs 2 (two) times daily. 1 each 3  . lisinopril (PRINIVIL,ZESTRIL) 10 MG tablet Take 1 tablet (10 mg total) by mouth daily. 90 tablet 3    Home: Home Living Family/patient expects to be discharged to:: Private residence  Functional History:   Functional Status:  Mobility:          ADL:    Cognition: Cognition Orientation Level: Oriented X4    Blood pressure 147/62, pulse 69, temperature 98.3 F (36.8 C), temperature source Oral, resp. rate 17, height 5' (1.524 m), weight 61.236 kg (135 lb), SpO2 98 %. Physical Exam  Constitutional: She is oriented to person, place, and  time. She appears well-developed and well-nourished.  HENT:  Head: Normocephalic and atraumatic.  Right Ear:  External ear normal.  Left Ear: External ear normal.  Eyes: Conjunctivae and EOM are normal. Pupils are equal, round, and reactive to light.  Neck: No JVD present. No tracheal deviation present. No thyromegaly present.  Cardiovascular: Normal rate.  Respiratory: No respiratory distress.  GI: She exhibits no distension. There is no tenderness.  Musculoskeletal: Normal range of motion.  Lymphadenopathy:   She has no cervical adenopathy.  Neurological: She is alert and oriented to person, place, and time. No cranial nerve deficit.  Appropriate with good insight and awareness. Speech clear. LUE 5/5. RUE: 2+ deltoid, bicep, 3- tricep, 3 wrist and hand. RLE: 3+ HF, 4 ke an adf/apf. Sensation 1/2 RUE and tr to 1/2 RLE. DTR's trace.  Psychiatric: She has a normal mood and affect. Her behavior is normal. Thought content normal.     Lab Results Last 24 Hours    Results for orders placed or performed during the hospital encounter of 07/28/14 (from the past 24 hour(s))  Lipid panel Status: None   Collection Time: 07/30/14 2:05 AM  Result Value Ref Range   Cholesterol 161 0 - 200 mg/dL   Triglycerides 72 <150 mg/dL   HDL 64 >39 mg/dL   Total CHOL/HDL Ratio 2.5 RATIO   VLDL 14 0 - 40 mg/dL   LDL Cholesterol 83 0 - 99 mg/dL      Imaging Results (Last 48 hours)    Ct Head Wo Contrast  07/29/2014 CLINICAL DATA: 73 year old female with acute right side weakness, increasing upper extremity weakness. Found have acute left deep gray matter hemorrhage on CT yesterday. Initial encounter. EXAM: CT HEAD WITHOUT CONTRAST TECHNIQUE: Contiguous axial images were obtained from the base of the skull through the vertex without intravenous contrast. COMPARISON: 07/28/2014. FINDINGS: Paranasal sinuses and mastoids are clear except for mild mucosal thickening in the left sphenoid sinus. No acute orbit or scalp soft tissue findings. No acute osseous abnormality  identified. Calcified atherosclerosis at the skull base. Mildly increased size of the left dorsal thalamus hyperdense hemorrhage, now 19 x 25 mm (previously 17 x 18 mm). Estimated volume now 6 mL. Secondary extension into the atrium of the left lateral ventricle re - identified. Mildly increased intraventricular hemorrhage layering in the left occipital horn. No ventriculomegaly. No other extra-axial extension identified. Stable gray-white matter differentiation elsewhere, multifocal subcortical white matter hypodensity. No midline shift. Patent basilar cisterns. No new cortically based infarct identified. No suspicious intracranial vascular hyperdensity. IMPRESSION: 1. Mildly increased size of the intra-axial hemorrhage located in the left thalamus near the posterior limb of the left internal capsule. Mild regional mass effect is stable. 2. Small volume left lateral intraventricular extension, no ventriculomegaly. 3. No new intracranial abnormality identified. Electronically Signed By: Lars Pinks M.D. On: 07/29/2014 18:06   Ct Head (brain) Wo Contrast  07/28/2014 CLINICAL DATA: Code stroke. Right-sided weakness. History of hypertension. EXAM: CT HEAD WITHOUT CONTRAST TECHNIQUE: Contiguous axial images were obtained from the base of the skull through the vertex without intravenous contrast. COMPARISON: None. FINDINGS: There is an 18 mm acute hemorrhage noted in the area of the left thalamus and posterior limb of the internal capsule. There is a small amount of blood also noted in the adjacent left lateral ventricle. No hydrocephalus. No mass effect or midline shift. 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 No acute ischemic infarct. No extra-axial fluid collection. No acute calvarial abnormality. IMPRESSION: Acute  left delay I can't posterior limb of the internal capsule hemorrhage measuring 18 mm. Intraventricular extension. Critical Value/emergent results were called  by telephone at the time of interpretation on 07/28/2014 at 7:42 pm to Dr. Doy Mince , who verbally acknowledged these results. Electronically Signed By: Rolm Baptise M.D. On: 07/28/2014 19:43   Chest Port 1 View  07/29/2014 CLINICAL DATA: Short of breath. History of COPD. EXAM: PORTABLE CHEST - 1 VIEW COMPARISON: None. FINDINGS: The heart size and mediastinal contours are within normal limits. There is a small hiatal hernia noted. Both lungs are clear. The visualized skeletal structures are unremarkable. IMPRESSION: No active disease. Electronically Signed By: Kerby Moors M.D. 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 Does the need for close, 24 hr/day medical supervision in concert with the patient's rehab needs make it unreasonable for this patient to be served in a less intensive setting? Yes 2. Co-Morbidities requiring supervision/potential complications: htn, dm 3. Due to bladder management, bowel management, safety, skin/wound care, disease management, medication administration, pain management and patient education, does the patient require 24 hr/day rehab nursing? Yes 4. Does the patient require coordinated care of a physician, rehab nurse, PT (1-2 hrs/day, 5 days/week) and OT (1-2 hrs/day, 5 days/week) to address physical and functional deficits in the context of the above medical diagnosis(es)? Yes and Potentially Addressing deficits in the following areas: balance, endurance, locomotion, strength, transferring, bowel/bladder control, bathing, dressing, feeding, grooming, toileting and psychosocial support 5. Can the patient actively participate in an intensive therapy program of at least 3 hrs of therapy per day at least 5 days per week? Yes 6. The potential for patient to make measurable gains while on inpatient rehab is excellent 7. Anticipated functional outcomes upon discharge from inpatient rehab are  modified independent and supervision with PT, modified independent and supervision with OT, n/a with SLP. 8. Estimated rehab length of stay to reach the above functional goals is: potentially 9-15 days 9. Does the patient have adequate social supports and living environment to accommodate these discharge functional goals? Yes and Potentially 10. Anticipated D/C setting: Home 11. Anticipated post D/C treatments: HH therapy and Outpatient therapy 12. Overall Rehab/Functional Prognosis: excellent  RECOMMENDATIONS: This patient's condition is appropriate for continued rehabilitative care in the following setting: CIR Patient has agreed to participate in recommended program. Yes Note that insurance prior authorization may be required for reimbursement for recommended care.  Comment: Await therapy evals. Given the extent of her right hemisensory loss and right hemiparesis, I anticipate the need for inpatient rehab. Rehab Admissions Coordinator to follow up.  Thanks,  Meredith Staggers, MD, [facility]     07/30/2014       Revision History     Date/Time User Provider Type Action   07/30/2014 10:16 AM Meredith Staggers, MD Physician Sign   07/30/2014 9:54 AM Meredith Staggers, MD Physician Share   View Details Report       Routing History     Date/Time From To Method   07/30/2014 10:16 AM Meredith Staggers, MD Meredith Staggers, MD In Basket

## 2014-08-03 NOTE — Progress Notes (Signed)
PMR Admission Coordinator Pre-Admission Assessment  Patient: Sherry Lambert is an 73 y.o., female MRN: [id number] DOB: Jun 02, 1941 Height: 5' (152.4 cm) Weight: 61.236 kg (135 lb)  Insurance Information HMO: PPO: yes PCP: IPA: 80/20: OTHER: medicare advantage plan PRIMARY: [employer] Medicare Policy#: [id number] Subscriber: pt CM Name: Su Hoff Phone#: [phone] ext [phone] Fax#: [phone] Pre-Cert#: [id number] Employer: retired approved for 7 days. Update due 12/7 Benefits: Phone #: [phone] Name: 08/03/14 Eff. 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 female with hypertension, peripheral vascular disease, diabetes mellitus, peripheral arterial disease and COPD experienced acute onset of weakness and numbness involving the right side on 07/28/2014. She had been taking aspirin daily. 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 Blood pressure was elevated requiring  immediate intervention with labetalol IV. NIH stroke score was 5. Patient was not administered TPA secondary to hemorrhage.   Total: 4 NIH    Past Medical History  Past Medical History  Diagnosis Date  . Emphysema of lung   . GERD (gastroesophageal reflux disease)   . Hypertension   . [name]   . [name]   . Peripheral arterial disease   . Diabetes mellitus without complication     Family History  family history includes Diabetes in her father; Hypertension in her father. There is no history of Cancer, Early death, or Stroke.  Prior Rehab/Hospitalizations: none  Current Medications  Current facility-administered medications: acetaminophen (TYLENOL) tablet 650 mg, 650 mg, Oral, Q4H PRN, 650 mg at 08/01/14 0857 **OR** acetaminophen (TYLENOL) suppository 650 mg, 650 mg, Rectal, Q4H PRN, [name]; cefTRIAXone (ROCEPHIN) 1 g in dextrose 5 % 50 mL IVPB - Premix, 1 g, Intravenous, Q24H, David L Rinehuls, PA-C, 1 g at 08/03/14 1456 enoxaparin (LOVENOX) injection 40 mg, 40 mg, Subcutaneous, Q24H, [name], NP, 40 mg at 08/03/14 1210; labetalol (NORMODYNE,TRANDATE) injection 10-40 mg, 10-40 mg, Intravenous, Q10 min PRN, [name], 10 mg at 07/30/14 1151; lisinopril (PRINIVIL,ZESTRIL) tablet 10 mg, 10 mg, Oral, Daily, [name], MD, 10 mg at 08/03/14 0945 mometasone-formoterol (DULERA) 100-5 MCG/ACT inhaler 2 puff, 2 puff, Inhalation, BID, [name], 2 puff at 08/03/14 2585; pantoprazole (PROTONIX) EC tablet 40 mg, 40 mg, Oral, QHS, [name], RPH, 40 mg at 08/02/14 2107; senna-docusate (Senokot-S) tablet 1 tablet, 1 tablet, Oral, BID, [name], 1 tablet at 07/30/14 2156  Patients Current Diet: Diet regular with thin liquids, min assist cues with SLP  Precautions / Restrictions Precautions Precautions: Fall Precaution Comments: Patient somewhat impulsive and quick to react Restrictions Weight Bearing  Restrictions: No   Prior Activity Level  Independent and driving pta. Active and self sufficient  Home Assistive Devices / Equipment Home Assistive Devices/Equipment: None Home Equipment: None  Prior Functional Level Prior  Function Level of Independence: Independent Comments: Pt drives, and was independent in the community setting   Current Functional Level Cognition  Overall Cognitive Status: Impaired/Different from baseline Orientation Level: Oriented X4 Safety/Judgement: Decreased awareness of safety General Comments: Patient is more aware of R side this session and able to use with minimal cues   Extremity Assessment (includes Sensation/Coordination)          ADLs  Overall ADL's : Needs assistance/impaired Eating/Feeding: Sitting, Set up Eating/Feeding Details (indicate cue type and reason): using Lt. UE; max A with Rt. UE Grooming: Wash/dry hands, Wash/dry face, Oral care, Brushing hair, Minimal assistance, Sitting Grooming Details (indicate cue type and reason): using Lt. UE Upper Body Bathing: Moderate assistance, Sitting Lower Body Bathing: Maximal assistance, Sit to/from stand Lower Body Bathing Details (indicate cue type and reason): Assist to bathe below knees, for perianal area and mod - max A for standing balance  Upper Body Dressing : Maximal assistance, Sitting Lower Body Dressing: Total assistance, Sit to/from stand Lower Body Dressing Details (indicate cue type and reason): Pt able to doff socks, using RUE for one and LUE for other while seated in recliner and crossing one leg over the other. Pt could not get socks started for donning process, she would grab it with her RUE but as she reached forward with LUE and RUE she would lose grip on it. I donned both socks 1/2 way and she finished process up and over heel with legs crossed and increased VCs to really focusing on squeezing the sock hard with RUE Toilet Transfer: Moderate assistance, +2 for  physical assistance, Stand-pivot Toilet Transfer Details (indicate cue type and reason): Sit>stand from recliner Mod A , then we ambulated from recliner to toilet in bathroom with hemi-walker with pt's tendency to lean forward and she has to catapult her RLE forward with waxing and waning balance (Min -Max A) depending on what stage of ambulating she was in. Could not safely make in back from bathroom ambulating with +1 due to leaning right too much and could not self correct completely even with cues Toileting- Clothing Manipulation and Hygiene: Total assistance (with Mod A sit<.stand) Toileting - Clothing Manipulation Details (indicate cue type and reason): Pt reliant on UE support to stand and not able to release one hand to attempt to wipe. Unable to maintain balance and position self to access peri are in sitting  Functional mobility during ADLs: Moderate assistance, +2 for physical assistance, Rolling walker General ADL Comments: Family requested OT assist pt with repositioning in chair    Mobility  Overal bed mobility: Needs Assistance Bed Mobility: Supine to Sit Supine to sit: Mod assist General bed mobility comments: Patient sitting up in recliner before and after session    Transfers  Overall transfer level: Needs assistance Equipment used: Hemi-walker Transfers: Sit to/from Stand Sit to Stand: Min assist Stand pivot transfers: Mod assist, +2 physical assistance General transfer comment: Min A to ensure balance. Multiple stands performed to review safe technique and to ensure COG over BOS with initial stand prior to taking steps.     Ambulation / Gait / Stairs / Wheelchair Mobility  Ambulation/Gait Ambulation/Gait assistance: Mod assist, +2 safety/equipment Ambulation Distance (Feet): 50 Feet (x2) Assistive device: Hemi-walker Gait Pattern/deviations: Step-to pattern, Decreased dorsiflexion - left, Decreased dorsiflexion - right, Wide base of support Gait velocity:  decreased General Gait Details: Worked on walking with use of the handrail in hallway. Patient able to shift weight better and control use of R  LE when not having to [facility] and just hold onto rails. Patient with some gait sequecy mishaps causing LOB but otherwise was able to progress some. Noted R LE fatigue with increased ambulation    Posture / Balance Dynamic Sitting Balance Sitting balance - Comments: static sitting with minguard to supervision at edge of bed, loss of balance with fatigue and dynamic reaching with min/mod assist     Special needs/care consideration Skin intact bowel mgmt: Last BM 11/29 incontinent Bladder mgmt: incontinent Diabetic mgmt yes Hgb A1c 6.6. Pt denies DM    Previous Home Environment Living Arrangements: Alone Lives With: Alone Available Help at Discharge: Family, Available 24 hours/day Type of Home: House Home Layout: One level Home Access: Stairs to enter Entrance Stairs-Rails: None Entrance Stairs-Number of Steps: 2 Bathroom Shower/Tub: Public librarian, Multimedia programmer: Standard Home Care Services: No Additional Comments: daughter in law reports they have access to shower seat and RW. Family lives behind and beside her and are able to provide 24 hour assist as needed at discharge.   Discharge Living Setting Plans for Discharge Living Setting: Patient's home, Alone Type of Home at Discharge: House Discharge Home Layout: One level Discharge Home Access: Stairs to enter Entrance Stairs-Rails: None Entrance Stairs-Number of Steps: 2 Discharge Bathroom Shower/Tub: Tub/shower unit, Walk-in shower Discharge Bathroom Toilet: Standard Does the patient have any problems obtaining your medications?: No  Social/Family/Support Systems Patient Roles: Parent Contact Information: Lupita Raider, son Anticipated Caregiver: son and family who live near by Anticipated Caregiver's Contact Information: see  above Ability/Limitations of Caregiver: son adn his wife work. It will have to be a combination of family to assist Caregiver Availability: Other (Comment) (less than 24/7 supervision available) Discharge Plan Discussed with Primary Caregiver: Yes Is Caregiver In Agreement with Plan?: Yes Does Caregiver/Family have Issues with Lodging/Transportation while Pt is in Rehab?: No Son states he would need time to arrange 24/7 assist or pay someone to help. Also would want to know if accomodations needed to be made in bathroom such as rails, etc.   Goals/Additional Needs Patient/Family Goal for Rehab: Mod I to supervision with PT , OT and SLP Expected length of stay: ELOS 9 to 15 days Pt/Family Agrees to Admission and willing to participate: Yes Program Orientation Provided & Reviewed with Pt/Caregiver Including Roles & Responsibilities: Yes  Decrease burden of Care through IP rehab admission: n/a  Possible need for SNF placement upon discharge: not expected  Patient Condition: This patient's medical and functional status has changed since the consult dated: 07/30/2014 in which the Rehabilitation Physician determined and documented that the patient's condition is appropriate for intensive rehabilitative care in an inpatient rehabilitation facility. See "History of Present Illness" (above) for medical update. Functional changes are: overall min to mod assist. Patient's medical and functional status update has been discussed with the Rehabilitation physician and patient remains appropriate for inpatient rehabilitation. Will admit to inpatient rehab today.  Preadmission Screen Completed By: Cleatrice Burke, 08/03/2014 4:13 PM ______________________________________________________________________  Discussed status with Dr. Naaman Plummer on 08/03/2014 at 1613 and received telephone approval for admission today.  Admission Coordinator: Cleatrice Burke, time 3267 Date 08/03/14           Cosigned by: Meredith Staggers, MD at 08/03/2014 4:48 PM  Revision History

## 2014-08-03 NOTE — Progress Notes (Signed)
STROKE TEAM PROGRESS NOTE   HISTORY Sherry Lambert is an 73 y.o. female with hypertension, peripheral vascular disease, diabetes mellitus, peripheral arterial disease and COPD experienced acute onset of weakness and numbness involving the right side at 6:45 PM today 07/28/2014. She has no previous history of stroke nor TIA. She's been taking aspirin daily. 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 Patient did not experience a headache. Blood pressure was elevated requiring immediate intervention with labetalol IV. NIH stroke score was 5.   Patient was not administered TPA secondary to hemorrhage. She was admitted to the neuro ICU for further evaluation and treatment.   SUBJECTIVE (INTERVAL HISTORY)  Patient is up in chair. Son at bedside. Await insurance approval for rehab. No changes noted   OBJECTIVE Temp:  [98 F (36.7 C)-98.1 F (36.7 C)] 98 F (36.7 C) (12/01 1015) Pulse Rate:  [73-78] 76 (12/01 1015) Cardiac Rhythm:  [-]  Resp:  [16-18] 17 (12/01 1015) BP: (105-157)/(49-89) 135/51 mmHg (12/01 1015) SpO2:  [96 %-99 %] 96 % (12/01 1015)   Recent Labs Lab 07/28/14 1942 07/31/14 1107  GLUCAP 111* 129*    Recent Labs Lab 07/28/14 1926 07/28/14 1959 08/01/14 1550  NA 142 143 138  K 4.0 3.8 4.0  CL 104 104 101  CO2 25  --  24  GLUCOSE 112* 113* 172*  BUN 13 14 13   CREATININE 0.87 0.90 0.75  CALCIUM 9.5  --  9.3    Recent Labs Lab 07/28/14 1926  AST 18  ALT 15  ALKPHOS 90  BILITOT <0.2*  PROT 7.4  ALBUMIN 3.9    Recent Labs Lab 07/28/14 1926 07/28/14 1959  WBC 10.2  --   NEUTROABS 6.2  --   HGB 14.7 16.3*  HCT 45.8 48.0*  MCV 91.6  --   PLT 272  --    No results for input(s): CKTOTAL, CKMB, CKMBINDEX, TROPONINI in the last 168 hours. No results for input(s): LABPROT, INR in the last 72 hours. No results for input(s): COLORURINE, LABSPEC, [place], GLUCOSEU, HGBUR, BILIRUBINUR, KETONESUR,  PROTEINUR, UROBILINOGEN, NITRITE, LEUKOCYTESUR in the last 72 hours.  Invalid input(s): APPERANCEUR     Component Value Date/Time   CHOL 161 07/30/2014 0205   TRIG 72 07/30/2014 0205   HDL 64 07/30/2014 0205   CHOLHDL 2.5 07/30/2014 0205   VLDL 14 07/30/2014 0205   LDLCALC 83 07/30/2014 0205   Lab Results  Component Value Date   HGBA1C 6.6* 07/30/2014      Component Value Date/Time   LABOPIA NONE DETECTED 07/29/2014 0308   COCAINSCRNUR NONE DETECTED 07/29/2014 0308   LABBENZ NONE DETECTED 07/29/2014 0308   AMPHETMU NONE DETECTED 07/29/2014 0308   THCU NONE DETECTED 07/29/2014 0308   LABBARB NONE DETECTED 07/29/2014 0308     Recent Labs Lab 07/28/14 1936  ETH <11    2-D echocardiogram 07/30/2014 LVEF 65-70%, mild LVH, normal wall motion, diastolic dysfunction with indeterminate LV filling pressure. Normal LA size, mild RAE, lipomatous hypertrophy of the IAS.  Ct Head Wo Contrast 07/29/2014    1. Mildly increased size of the intra-axial hemorrhage located in the left thalamus near the posterior limb of the left internal capsule. Mild regional mass effect is stable.  2. Small volume left lateral intraventricular extension, no ventriculomegaly.  3. No new intracranial abnormality identified.     Mri Mra Head Wo Contrast 07/30/2014    1. Acute/subacute left thalamic hemorrhage without evidence for an  underlying mass.  2. Atrophy and moderate diffuse white matter disease.  3. Moderate MCA branch vessel stenoses, including a proximal anterior division stenosis on the left.  4. Moderate more distal PCA branch vessel disease bilaterally.      PHYSICAL EXAM Pleasant elderly caucasian lady not in distress. Awake alert. Afebrile. Head is nontraumatic. Neck is supple without bruit. Hearing is normal. Cardiac exam no murmur or gallop. Lungs are clear to auscultation. Distal pulses are well felt. Neurological Exam : Awake alert oriented 3 with normal speech and language  function. Pupils are equal reactive. Fundi were not visualized.  Visual fields are full to bedside confrontational testing. Mild right lower facial weakness. Tongue midline. Motor system exam revealed no upper or lower drift. Mild weakness of right upper and lower extremities 3+-4/5. Mild subjective diminished touch and pinprick sensation on the right lower face arm and leg. Coordination appears intact, no dysmetria. Deep tendon reflexes are 2+ symmetric. Plantars are downgoing. Not able to test gait.   ASSESSMENT/PLAN [name] is a 73 y.o. female with history of hypertension, peripheral vascular disease, diabetes mellitus, peripheral arterial disease and COPD presenting with right side weakness and numbness. She did not receive IV t-PA due to hemorrhage.   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  Resultant  Right hemiparesis and sensory deficit  MRI  Left thalamic hemorrhage with left IVH  MRA  Moderate intracranial atherosclerosis  Carotid Doppler - 1-39% internal carotid artery stenosis bilaterally. Unable to visualize the right vertebral artery.  2D Echo - unremarkable  LDL 83  Hemoglobin A1c - 6.6  SCDs for VTE prophylaxis  Diet regular thin liquids  aspirin 81 mg orally every day prior to admission  Ongoing aggressive stroke risk factor management  Therapy recommendations:  CIR recommended  Disposition:  Await inpatient rehabilitation bed with insurance approval.  Hypertension  Home meds:   Lisinopril  BP 180/86 on admission  BP 139-157/62-97 past 24h (08/03/2014 @ 1:32 PM)   Will increase SBP goal <180/90 as this is hospital day #5.  Saline lock IV  Diabetes  HgbA1c 6.6  At goal < 7.0  Other Stroke Risk Factors  Advanced age  Former Cigarette smoker  PAD  Other Issues  UTI - culture positive for  Escherichia coli - IV Rocephin D4/5.  Hospital day # 6  Lashawna Poche  I have personally examined this patient, reviewed notes, independently viewed imaging studies, participated in medical decision making and plan of care. I have made any additions or clarifications directly to the above note. Agree with note above. Await rehab transfer after insurance approval and bed availability  [name], MD Medical Director [facility] Pager: [phone] 08/03/2014 1:32 PM   To contact Stroke Continuity provider, please refer to [email or web address] After hours, contact General Neurology

## 2014-08-03 NOTE — Progress Notes (Signed)
Physical Therapy Treatment Patient Details Name: Sherry Lambert MRN: [id number] DOB: 1941-05-19 Today's Date: 08/03/2014    History of Present Illness Sherry Lambert is a 73 y.o. female with hypertension, peripheral vascular disease, diabetes mellitus, peripheral arterial disease and COPD experienced acute onset of weakness and numbness involving the right side on 07/28/2014. She had been taking aspirin daily. 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 Blood pressure was elevated requiring immediate intervention with labetalol IV. NIH stroke score was 5. Patient was not administered TPA secondary to hemorrhage    PT Comments    Patient continues to be highly motivated (which can lead to her impulsiveness). Able to progress with ambulation this session. Eagerly awaiting insurance approval for CIR at this time. Continue with current POC  Follow Up Recommendations  CIR     Equipment Recommendations       Recommendations for Other Services Rehab consult     Precautions / Restrictions Precautions Precautions: Fall Precaution Comments: Patient somewhat impulsive and quick to react Restrictions Weight Bearing Restrictions: No    Mobility  Bed Mobility               General bed mobility comments: Patient sitting up in recliner before and after session  Transfers Overall transfer level: Needs assistance Equipment used: Hemi-walker   Sit to Stand: Min assist         General transfer comment: Min A to ensure balance. Multiple stands performed to review safe technique and to ensure COG over BOS with initial stand prior to taking steps.   Ambulation/Gait Ambulation/Gait assistance: Mod assist;+2 safety/equipment Ambulation Distance (Feet): 50 Feet (x2)   Gait Pattern/deviations: Step-to pattern;Decreased dorsiflexion - left;Decreased dorsiflexion - right;Wide base  of support Gait velocity: decreased   General Gait Details: Worked on walking with use of the handrail in hallway. Patient able to shift weight better and control use of R LE when not having to [employer] and just hold onto rails. Patient with some gait sequecy mishaps causing LOB but otherwise was able to progress some. Noted R LE fatigue with increased ambulation   Stairs            Wheelchair Mobility    Modified Rankin (Stroke Patients Only) Modified Rankin (Stroke Patients Only) Pre-Morbid Rankin Score: No symptoms Modified Rankin: Moderately severe disability     Balance     Sitting balance-Leahy Scale: Fair       Standing balance-Leahy Scale: Poor                      Cognition Arousal/Alertness: Awake/alert Behavior During Therapy: WFL for tasks assessed/performed;Impulsive Overall Cognitive Status: Impaired/Different from baseline Area of Impairment: Safety/judgement;Awareness         Safety/Judgement: Decreased awareness of safety          Exercises      General Comments        Pertinent Vitals/Pain Pain Assessment: No/denies pain    Home Living                      Prior Function            PT Goals (current goals can now be found in the care plan section) Progress towards PT goals: Progressing toward goals    Frequency  Min 4X/week    PT Plan Current plan remains appropriate  Co-evaluation             End of Session Equipment Utilized During Treatment: Gait belt Activity Tolerance: Patient tolerated treatment well Patient left: in chair;with call bell/phone within reach;with chair alarm set     Time: 1101-1130 PT Time Calculation (min) (ACUTE ONLY): 29 min  Charges:  $Gait Training: 23-37 mins                    G Codes:      Jacqualyn Posey 08/03/2014, 12:08 PM  08/03/2014 Jacqualyn Posey PTA [phone] pager [phone] office

## 2014-08-03 NOTE — H&P (View-Only) (Signed)
Physical Medicine and Rehabilitation Admission H&P    Chief Complaint  Patient presents with  . Code Stroke  : HPI: Sherry Lambert is a 73 y.o. female with hypertension, peripheral vascular disease, diabetes mellitus, peripheral arterial disease and COPD experienced acute onset of weakness and numbness involving the right side on 07/28/2014. She had been taking aspirin daily. 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 MRI showed acute subacute left thalamic hemorrhage. Carotid Dopplers no ICA stenosis.Echocardiogram ejection fraction 35% grade 1 diastolic dysfunction. Blood pressure was elevated requiring immediate intervention with labetalol IV. NIH stroke score was 5. Patient was not administered TPA secondary to hemorrhage. Lovenox was later initiated for DVT prophylaxis 08/03/2014. Patient is tolerating a regular diet. Placed on intravenous Rocephin 5 days for Escherichia coli UTI to be completed 08/04/2014. Physical and occupational therapy evaluations completed with recommendations of physical medicine rehabilitation consult. Patient was admitted for comprehensive rehabilitation program  ROS Review of Systems  Constitutional: Negative for fever and chills.  HENT: Negative for hearing loss.  Eyes: Negative for blurred vision, double vision and photophobia.  Respiratory: Negative for cough.  Cardiovascular: Negative for chest pain and palpitations.  Gastrointestinal: Negative for nausea and vomiting.  Genitourinary: Negative for dysuria.  Musculoskeletal: Negative for myalgias.  Skin: Negative for itching.  Neurological: Positive for tingling, sensory change and focal weakness. Negative for headaches     Past Medical History  Diagnosis Date  . Emphysema of lung   . GERD (gastroesophageal reflux disease)   . Hypertension   . [name]   . [name]     . Peripheral arterial disease   . Diabetes mellitus without complication    Past Surgical History  Procedure Laterality Date  . Tonsillectomy    . Abdominal hysterectomy    . Colonoscopy    . Esophagogastroduodenoscopy N/A 12/23/2012    Procedure: ESOPHAGOGASTRODUODENOSCOPY (EGD);  Surgeon: Inda Castle, MD;  Location: [facility];  Service: Endoscopy;  Laterality: N/A;  . Balloon dilation N/A 12/23/2012    Procedure: BALLOON DILATION;  Surgeon: Inda Castle, MD;  Location: [facility];  Service: Endoscopy;  Laterality: N/A;  . Esophagogastroduodenoscopy N/A 01/15/2013    Procedure: ESOPHAGOGASTRODUODENOSCOPY (EGD);  Surgeon: Inda Castle, MD;  Location: [facility];  Service: Endoscopy;  Laterality: N/A;  . Balloon dilation N/A 01/15/2013    Procedure: BALLOON DILATION;  Surgeon: Inda Castle, MD;  Location: [facility];  Service: Endoscopy;  Laterality: N/A;   Family History  Problem Relation Age of Onset  . Hypertension Father   . Diabetes Father   . Cancer Neg Hx   . Early death Neg Hx   . Stroke Neg Hx    Social History:  reports that she has quit smoking. Her smoking use included Cigarettes. She smoked 0.00 packs per day for 55 years. She has never used smokeless tobacco. She reports that she does not drink alcohol or use illicit drugs. Allergies: No Known Allergies Medications Prior to Admission  Medication Sig Dispense Refill  . aspirin 81 MG tablet Take 81 mg by mouth daily.    . Fluticasone-Salmeterol (ADVAIR) 100-50 MCG/DOSE AEPB Inhale 1 puff into the lungs 2 (two) times daily. 1 each 3  . lisinopril (PRINIVIL,ZESTRIL) 10 MG tablet Take 1 tablet (10 mg total) by mouth daily. 90 tablet 3    Home: Home Living Family/patient expects to be discharged  to:: Inpatient rehab Living Arrangements: Alone Available Help at Discharge: Family, Available 24 hours/day Type of Home: House Home Access: Stairs to enter [employer] of Steps: 2 Entrance  Stairs-Rails: None Home Layout: One level Home Equipment: None Additional Comments: daughter in law reports they have access to shower seat and RW.  Family lives behind and beside her and are able to provide 24 hour assist as needed at discharge.   Lives With: Alone   Functional History: Prior Function Level of Independence: Independent Comments: Pt drives, and was independent in the community setting   Functional Status:  Mobility: Bed Mobility Overal bed mobility: Needs Assistance Bed Mobility: Supine to Sit Supine to sit: Mod assist General bed mobility comments: Patient sitting up in recliner before and after session Transfers Overall transfer level: Needs assistance Equipment used: Hemi-walker Transfers: Sit to/from Stand Sit to Stand: Mod assist Stand pivot transfers: Mod assist, +2 physical assistance General transfer comment: Performed sit to stand x 2 with mod A to ensure balance and proper positioning with standing. Patient very close to only requiring min A.  Ambulation/Gait Ambulation/Gait assistance: Mod assist, +2 physical assistance Ambulation Distance (Feet): 50 Feet Assistive device: Hemi-walker Gait Pattern/deviations: Step-to pattern, Decreased weight shift to left, Decreased dorsiflexion - right, Ataxic, Wide base of support General Gait Details: Patient able to move R foot on her own this session. There were times that she went with the wrong gait sequence throwing herself off balance and requiring +2 mod A to prevent fall. Patient able to use hemi-walker very well with gait this session. R hand was slipping off RW and was unsafe for patient to use. Patient required two seated rest breaks    ADL: ADL Overall ADL's : Needs assistance/impaired Eating/Feeding: Sitting, Set up Eating/Feeding Details (indicate cue type and reason): using Lt. UE; max A with Rt. UE Grooming: Wash/dry hands, Wash/dry face, Oral care, Brushing hair, Minimal assistance,  Sitting Grooming Details (indicate cue type and reason): using Lt. UE Upper Body Bathing: Moderate assistance, Sitting Lower Body Bathing: Maximal assistance, Sit to/from stand Lower Body Bathing Details (indicate cue type and reason): Assist to bathe below knees, for perianal area and mod - max A for standing balance  Upper Body Dressing : Maximal assistance, Sitting Lower Body Dressing: Total assistance, Sit to/from stand Lower Body Dressing Details (indicate cue type and reason): Pt able to doff socks, using RUE for one and LUE for other while seated in recliner and crossing one leg over the other. Pt could not get socks started for donning process, she would grab it with her RUE but as she reached forward with LUE and RUE she would lose grip on it. I donned both socks 1/2 way and she finished process up and over heel with legs crossed and increased VCs to really focusing on squeezing the sock hard with RUE Toilet Transfer: Moderate assistance, +2 for physical assistance, Stand-pivot Toilet Transfer Details (indicate cue type and reason): Sit>stand from recliner Mod A , then we ambulated from recliner to toilet in bathroom with hemi-walker with pt's tendency to lean forward and she has to catapult her RLE forward with waxing and waning balance (Min -Max A) depending on what stage of ambulating she was in. Could not safely make in back from bathroom ambulating with +1 due to leaning right too much and could not self correct completely even with cues Toileting- Clothing Manipulation and Hygiene: Total assistance (with Mod A sit<.stand) Toileting - Clothing Manipulation Details (indicate cue type  and reason): Pt reliant on UE support to stand and not able to release one hand to attempt to wipe.  Unable to maintain balance and position self to access peri are in sitting  Functional mobility during ADLs: Moderate assistance, +2 for physical assistance, Rolling walker General ADL Comments: Family requested  OT assist pt with repositioning in chair  Cognition: Cognition Overall Cognitive Status: Within Functional Limits for tasks assessed Orientation Level: Oriented X4 Cognition Arousal/Alertness: Awake/alert Behavior During Therapy: WFL for tasks assessed/performed Overall Cognitive Status: Within Functional Limits for tasks assessed Area of Impairment: Safety/judgement, Problem solving Safety/Judgement: Decreased awareness of safety (decreased awareness that she leaning right ) Awareness: Intellectual Problem Solving: Difficulty sequencing, Requires verbal cues (for ambulation with hemi-walker) General Comments: Patient is more aware of R side this session and able to use with minimal cues  Physical Exam: Blood pressure 135/51, pulse 76, temperature 98 F (36.7 C), temperature source Oral, resp. rate 17, height 5' (1.524 m), weight 61.236 kg (135 lb), SpO2 96 %. Physical Exam Constitutional: She is oriented to person, place, and time. She appears well-developed and well-nourished.  HENT: oral mucosa pink and moist  Head: Normocephalic and atraumatic.  Right Ear: External ear normal.  Left Ear: External ear normal.  Eyes: Conjunctivae and EOM are normal. Pupils are equal, round, and reactive to light.  Neck: No JVD present. No tracheal deviation present. No thyromegaly present.  Cardiovascular: Normal rate.  Respiratory: No respiratory distress.  GI: She exhibits no distension. There is no tenderness.  Musculoskeletal: Normal range of motion.  Lymphadenopathy:   She has no cervical adenopathy.  Neurological: She is alert and oriented to person, place, and time. Mild sensory loss to LT/pain right face. Speech clear. Cognition appropriate. Appropriate with good insight and awareness. Speech clear. LUE 5/5. RUE: 3+ deltoid, bicep, 4- tricep, 4 wrist and hand. RLE: 4- HF, 4 ke an adf/apf. Sensation 1/2 RUE to PP and LT and tr to 1/2 RLE to PP and LT. DTR's trace.  Psychiatric: She has  a normal mood and affect. Her behavior is normal. Thought content normal   Results for orders placed or performed during the hospital encounter of 07/28/14 (from the past 48 hour(s))  Basic metabolic panel     Status: Abnormal   Collection Time: 08/01/14  3:50 PM  Result Value Ref Range   Sodium 138 137 - 147 mEq/L   Potassium 4.0 3.7 - 5.3 mEq/L   Chloride 101 96 - 112 mEq/L   CO2 24 19 - 32 mEq/L   Glucose, Bld 172 (H) 70 - 99 mg/dL   BUN 13 6 - 23 mg/dL   Creatinine, Ser 0.75 0.50 - 1.10 mg/dL   Calcium 9.3 8.4 - 10.5 mg/dL   GFR calc non Af Amer 83 (L) >90 mL/min   GFR calc Af Amer >90 >90 mL/min    Comment: (NOTE) The eGFR has been calculated using the CKD EPI equation. This calculation has not been validated in all clinical situations. eGFR's persistently <90 mL/min signify possible Chronic Kidney Disease.    Anion gap 13 5 - 15   No results found.     Medical Problem List and Plan: 1. 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  DVT Prophylaxis/Anticoagulation: Lovenox initiated 08/03/2014 for DVT prophylaxis. Monitor platelet counts of any signs of bleeding 3. Pain Management: Tylenol as needed 4. COPD.Dulera twice a day. 5. Neuropsych: This patient is capable of making decisions on her own behalf.  6. Skin/Wound Care: Routine skin checks 7. Fluids/Electrolytes/Nutrition: Strict I and O's. Follow-up labs 8. Hypertension. Lisinopril 10 mg daily. Monitor with increased mobility 9. Escherichia coli UTI. Complete Rocephin 08/04/2014. Denies any dysuria or hematuria    Post Admission Physician Evaluation: 1. 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 2. Patient is admitted to receive collaborative, interdisciplinary care between the physiatrist, rehab nursing staff, and therapy team. 3. Patient's level of medical complexity and substantial therapy needs in context of that medical necessity  cannot be provided at a lesser intensity of care such as a SNF. 4. Patient has experienced substantial functional loss from his/her baseline which was documented above under the "Functional History" and "Functional Status" headings.  Judging by the patient's diagnosis, physical exam, and functional history, the patient has potential for functional progress which will result in measurable gains while on inpatient rehab.  These gains will be of substantial and practical use upon discharge  in facilitating mobility and self-care at the household level. 5. Physiatrist will provide 24 hour management of medical needs as well as oversight of the therapy plan/treatment and provide guidance as appropriate regarding the interaction of the two. 6. 24 hour rehab nursing will assist with bladder management, bowel management, safety, skin/wound care, disease management, medication administration, pain management and patient education  and help integrate therapy concepts, techniques,education, etc. 7. PT will assess and treat for/with: Lower extremity strength, range of motion, stamina, balance, functional mobility, safety, adaptive techniques and equipment, NMR, visual-spatial awareness, proprioception, stroke education.   Goals are: mod I to supervision. 8. OT will assess and treat for/with: ADL's, functional mobility, safety, upper extremity strength, adaptive techniques and equipment, NMR, visual-spatial awareness, proprioception, stroke ed, ego support.   Goals are: mod I to supervision. Therapy may proceed with showering this patient. 9. SLP will assess and treat for/with: n/a.  Goals are: n/a. 10. Case Management and Social Worker will assess and treat for psychological issues and discharge planning. 11. Team conference will be held weekly to assess progress toward goals and to determine barriers to discharge. 12. Patient will receive at least 3 hours of therapy per day at least 5 days per week. 13. ELOS: 12-17  days       14. Prognosis:  excellent     Meredith Staggers, MD, [facility] 08/03/2014   08/03/2014

## 2014-08-03 NOTE — Progress Notes (Signed)
Patient admitted to [phone] via wheelchair, escorted by nursing staff and family.  Patient and son verbalized understanding of rehab process, signed fall safety plan. Patient appears to be in no immediate distress at this time.  Brita Romp, RN

## 2014-08-03 NOTE — PMR Pre-admission (Signed)
PMR Admission Coordinator Pre-Admission Assessment  Patient: Sherry Lambert is an 73 y.o., female MRN: [id number] DOB: [birth year] Height: 5' (152.4 cm) Weight: 61.236 kg (135 lb)              Insurance Information HMO:     PPO: yes     PCP:      IPA:      80/20:      OTHER: medicare advantage plan PRIMARY: [employer] Medicare      Policy#: [id number]      Subscriber: pt CM Name: Su Hoff      Phone#: [phone]     Fax#: [phone] Pre-Cert#: [id number]      Employer: retired approved for 7 days. Update due 12/7 Benefits:  Phone #: [phone]     Name: 08/03/14 Eff. Date: 09/03/2013     Deduct: none      Out of Pocket Max: $3950      Life Max: none CIR: $265 copay per day days 1-6      SNF: no copay days 1-20; $140 copay per day days 21-100 Outpatient: $40 copay per visit     Co-Pay: no visit limit Home Health: 100%      Co-Pay: no visit limit DME: 80%     Co-Pay: 20% Providers: in network  SECONDARY: none        Medicaid Application Date:       Case Manager:  Disability Application Date:       Case Worker:   Emergency Facilities manager Information    Name Relation Home Work Mobile   Beulah Son [phone]     Current Medical History  Patient Admitting Diagnosis: 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 female with hypertension, peripheral vascular disease, diabetes mellitus, peripheral arterial disease and COPD experienced acute onset of weakness and numbness involving the right side on 07/28/2014. She had been taking aspirin daily. 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 Blood pressure was elevated requiring immediate intervention with labetalol IV. NIH stroke score was 5. Patient was not administered TPA secondary to hemorrhage.   Total: 4 NIH     Past Medical History  Past Medical History  Diagnosis Date  . Emphysema of lung   . GERD (gastroesophageal reflux disease)   . Hypertension   . [name]   . [name]   . Peripheral arterial disease   . Diabetes mellitus without complication     Family History  family history includes Diabetes in her father; Hypertension in her father. There is no history of Cancer, Early death, or Stroke.  Prior Rehab/Hospitalizations: none  Current Medications  Current facility-administered medications: acetaminophen (TYLENOL) tablet 650 mg, 650 mg, Oral, Q4H PRN, 650 mg at 08/01/14 0857 **OR** acetaminophen (TYLENOL) suppository 650 mg, 650 mg, Rectal, Q4H PRN, [name];  cefTRIAXone (ROCEPHIN) 1 g in dextrose 5 % 50 mL IVPB - Premix, 1 g, Intravenous, Q24H, David L Rinehuls, PA-C, 1 g at 08/03/14 1456 enoxaparin (LOVENOX) injection 40 mg, 40 mg, Subcutaneous, Q24H, [name], NP, 40 mg at 08/03/14 1210;  labetalol (NORMODYNE,TRANDATE) injection 10-40 mg, 10-40 mg, Intravenous, Q10 min PRN, [name], 10 mg at 07/30/14 1151;  lisinopril (PRINIVIL,ZESTRIL) tablet 10 mg, 10 mg, Oral, Daily, [name], MD, 10 mg at 08/03/14 0945 mometasone-formoterol (DULERA) 100-5 MCG/ACT inhaler  2 puff, 2 puff, Inhalation, BID, [name], 2 puff at 08/03/14 4627;  pantoprazole (PROTONIX) EC tablet 40 mg, 40 mg, Oral, QHS, Charmian Muff [place], RPH, 40 mg at 08/02/14 2107;  senna-docusate (Senokot-S) tablet 1 tablet, 1 tablet, Oral, BID, [name], 1 tablet at 07/30/14 2156  Patients Current Diet: Diet regular with thin liquids, min assist cues with SLP  Precautions / Restrictions Precautions Precautions: Fall Precaution Comments: Patient somewhat impulsive and quick to react Restrictions Weight Bearing Restrictions: No   Prior Activity Level  Independent and driving pta. Active and self sufficient  Home Assistive Devices / Equipment Home Assistive  Devices/Equipment: None Home Equipment: None  Prior Functional Level Prior Function Level of Independence: Independent Comments: Pt drives, and was independent in the community setting   Current Functional Level Cognition  Overall Cognitive Status: Impaired/Different from baseline Orientation Level: Oriented X4 Safety/Judgement: Decreased awareness of safety General Comments: Patient is more aware of R side this session and able to use with minimal cues    Extremity Assessment (includes Sensation/Coordination)          ADLs  Overall ADL's : Needs assistance/impaired Eating/Feeding: Sitting, Set up Eating/Feeding Details (indicate cue type and reason): using Lt. UE; max A with Rt. UE Grooming: Wash/dry hands, Wash/dry face, Oral care, Brushing hair, Minimal assistance, Sitting Grooming Details (indicate cue type and reason): using Lt. UE Upper Body Bathing: Moderate assistance, Sitting Lower Body Bathing: Maximal assistance, Sit to/from stand Lower Body Bathing Details (indicate cue type and reason): Assist to bathe below knees, for perianal area and mod - max A for standing balance  Upper Body Dressing : Maximal assistance, Sitting Lower Body Dressing: Total assistance, Sit to/from stand Lower Body Dressing Details (indicate cue type and reason): Pt able to doff socks, using RUE for one and LUE for other while seated in recliner and crossing one leg over the other. Pt could not get socks started for donning process, she would grab it with her RUE but as she reached forward with LUE and RUE she would lose grip on it. I donned both socks 1/2 way and she finished process up and over heel with legs crossed and increased VCs to really focusing on squeezing the sock hard with RUE Toilet Transfer: Moderate assistance, +2 for physical assistance, Stand-pivot Toilet Transfer Details (indicate cue type and reason): Sit>stand from recliner Mod A , then we ambulated from recliner to toilet in  bathroom with hemi-walker with pt's tendency to lean forward and she has to catapult her RLE forward with waxing and waning balance (Min -Max A) depending on what stage of ambulating she was in. Could not safely make in back from bathroom ambulating with +1 due to leaning right too much and could not self correct completely even with cues Toileting- Clothing Manipulation and Hygiene: Total assistance (with Mod A sit<.stand) Toileting - Clothing Manipulation Details (indicate cue type and reason): Pt reliant on UE support to stand and not able to release one hand to attempt to wipe.  Unable to maintain balance and position self to access peri are in sitting  Functional mobility during ADLs: Moderate assistance, +2 for physical assistance, Rolling walker General ADL Comments: Family requested OT assist pt with repositioning in chair    Mobility  Overal bed mobility: Needs Assistance Bed Mobility: Supine to Sit Supine to sit: Mod assist General bed mobility comments: Patient sitting up in recliner before and after session    Transfers  Overall transfer level: Needs assistance Equipment  used: Hemi-walker Transfers: Sit to/from [employer] to Stand: Min assist Stand pivot transfers: Mod assist, +2 physical assistance General transfer comment: Min A to ensure balance. Multiple stands performed to review safe technique and to ensure COG over BOS with initial stand prior to taking steps.     Ambulation / Gait / Stairs / Wheelchair Mobility  Ambulation/Gait Ambulation/Gait assistance: Mod assist, +2 safety/equipment Ambulation Distance (Feet): 50 Feet (x2) Assistive device: Hemi-walker Gait Pattern/deviations: Step-to pattern, Decreased dorsiflexion - left, Decreased dorsiflexion - right, Wide base of support Gait velocity: decreased General Gait Details: Worked on walking with use of the handrail in hallway. Patient able to shift weight better and control use of R LE when not having to [employer] and just hold onto rails. Patient with some gait sequecy mishaps causing LOB but otherwise was able to progress some. Noted R LE fatigue with increased ambulation    Posture / Balance Dynamic Sitting Balance Sitting balance - Comments: static sitting with minguard to supervision at edge of bed, loss of balance with fatigue and dynamic reaching with min/mod assist     Special needs/care consideration Skin intact bowel mgmt: Last BM 11/29 incontinent Bladder mgmt: incontinent Diabetic mgmt yes Hgb A1c 6.6. Pt denies DM    Previous Home Environment Living Arrangements: Alone  Lives With: Alone Available Help at Discharge: Family, Available 24 hours/day Type of Home: House Home Layout: One level Home Access: Stairs to enter Entrance Stairs-Rails: None Entrance Stairs-Number of Steps: 2 Bathroom Shower/Tub: Public librarian, Multimedia programmer: Standard Home Care Services: No Additional Comments: daughter in law reports they have access to shower seat and RW.  Family lives behind and beside her and are able to provide 24 hour assist as needed at discharge.   Discharge Living Setting Plans for Discharge Living Setting: Patient's home, Alone Type of Home at Discharge: House Discharge Home Layout: One level Discharge Home Access: Stairs to enter Entrance Stairs-Rails: None Entrance Stairs-Number of Steps: 2 Discharge Bathroom Shower/Tub: Tub/shower unit, Walk-in shower Discharge Bathroom Toilet: Standard Does the patient have any problems obtaining your medications?: No  Social/Family/Support Systems Patient Roles: Parent Contact Information: Lupita Raider, son Anticipated Caregiver: son and family who live near by Anticipated Caregiver's Contact Information: see above Ability/Limitations of Caregiver: son adn his wife work. It will have to be a combination of family to assist Caregiver Availability: Other (Comment) (less than 24/7 supervision  available) Discharge Plan Discussed with Primary Caregiver: Yes Is Caregiver In Agreement with Plan?: Yes Does Caregiver/Family have Issues with Lodging/Transportation while Pt is in Rehab?: No Son states he would need time to arrange 24/7 assist or pay someone to help. Also would want to know if accomodations needed to be made in bathroom such as rails, etc.   Goals/Additional Needs Patient/Family Goal for Rehab: Mod I to supervision with PT , OT and SLP Expected length of stay: ELOS 9 to 15 days Pt/Family Agrees to Admission and willing to participate: Yes Program Orientation Provided & Reviewed with Pt/Caregiver Including Roles  & Responsibilities: Yes  Decrease burden of Care through IP rehab admission: n/a  Possible need for SNF placement upon discharge: not expected  Patient Condition: This patient's medical and functional status has changed since the consult dated: 07/30/2014 in which the Rehabilitation Physician determined and documented that the patient's condition is appropriate for intensive rehabilitative care in an inpatient rehabilitation facility. See "History of Present Illness" (above) for medical update. Functional changes are: overall min to mod assist.  Patient's medical and functional status update has been discussed with the Rehabilitation physician and patient remains appropriate for inpatient rehabilitation. Will admit to inpatient rehab today.  Preadmission Screen Completed By:  Cleatrice Burke, 08/03/2014 4:13 PM ______________________________________________________________________   Discussed status with Dr. Naaman Plummer on 08/03/2014 at  1613 and received telephone approval for admission today.  Admission Coordinator:  Cleatrice Burke, time 2878 Date 08/03/14

## 2014-08-03 NOTE — Discharge Summary (Signed)
Stroke Discharge Summary  Patient ID: Sherry Lambert   MRN: [id number]      DOB: 10-12-1940  Date of Admission: 07/28/2014 Date of Discharge: 08/03/2014  Attending Physician:  [name], MD, Stroke MD  Consulting Physician(s):    Alger Simons, MD (Physical Medicine & Rehabtilitation)   Patient's PCP:  No primary care provider on file.  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 Emphysema of lung   . GERD (gastroesophageal reflux disease)   . Hypertension   . [name]   . [name]   . Peripheral arterial disease   . Diabetes mellitus without complication    Past Surgical History  Procedure Laterality Date  . Tonsillectomy    . Abdominal hysterectomy    . Colonoscopy    . Esophagogastroduodenoscopy N/A 12/23/2012    Procedure: ESOPHAGOGASTRODUODENOSCOPY (EGD);  Surgeon: Inda Castle, MD;  Location: [facility];  Service: Endoscopy;  Laterality: N/A;  . Balloon dilation N/A 12/23/2012    Procedure: BALLOON DILATION;  Surgeon: Inda Castle, MD;  Location: [facility];  Service: Endoscopy;  Laterality: N/A;  . Esophagogastroduodenoscopy N/A 01/15/2013    Procedure: ESOPHAGOGASTRODUODENOSCOPY (EGD);  Surgeon: Inda Castle, MD;  Location: [facility];  Service: Endoscopy;  Laterality: N/A;  . Balloon dilation N/A 01/15/2013    Procedure: BALLOON DILATION;  Surgeon: Inda Castle, MD;  Location: [facility];  Service: Endoscopy;  Laterality: N/A;    Medications to be continued on Rehab . cefTRIAXone (ROCEPHIN)  IV  1 g Intravenous Q24H  . enoxaparin (LOVENOX) injection  40 mg Subcutaneous Q24H  . lisinopril  10 mg Oral Daily  . mometasone-formoterol  2 puff Inhalation BID  . pantoprazole  40 mg Oral QHS  . senna-docusate  1 tablet  Oral BID    LABORATORY STUDIES CBC    Component Value Date/Time   WBC 10.2 07/28/2014 1926   RBC 5.00 07/28/2014 1926   HGB 16.3* 07/28/2014 1959   HCT 48.0* 07/28/2014 1959   PLT 272 07/28/2014 1926   MCV 91.6 07/28/2014 1926   MCH 29.4 07/28/2014 1926   MCHC 32.1 07/28/2014 1926   RDW 13.6 07/28/2014 1926   LYMPHSABS 3.3 07/28/2014 1926   MONOABS 0.6 07/28/2014 1926   EOSABS 0.1 07/28/2014 1926   BASOSABS 0.0 07/28/2014 1926   CMP    Component Value Date/Time   NA 138 08/01/2014 1550   K 4.0 08/01/2014 1550   CL 101 08/01/2014 1550   CO2 24 08/01/2014 1550   GLUCOSE 172* 08/01/2014 1550   BUN 13 08/01/2014 1550   CREATININE 0.75 08/01/2014 1550   CALCIUM 9.3 08/01/2014 1550   PROT 7.4 07/28/2014 1926   ALBUMIN 3.9 07/28/2014 1926   AST 18 07/28/2014 1926   ALT 15 07/28/2014 1926   ALKPHOS 90 07/28/2014 1926   BILITOT <0.2* 07/28/2014 1926   GFRNONAA 83* 08/01/2014 1550   GFRAA >90 08/01/2014 1550   COAGS Lab Results  Component Value Date   INR 1.01 07/28/2014   Lipid Panel    Component Value Date/Time   CHOL 161 07/30/2014 0205   TRIG 72 07/30/2014 0205   HDL 64 07/30/2014 0205   CHOLHDL 2.5 07/30/2014 0205   VLDL 14 07/30/2014 0205   LDLCALC 83  07/30/2014 0205   HgbA1C  Lab Results  Component Value Date   HGBA1C 6.6* 07/30/2014   Cardiac Panel (last 3 results) No results for input(s): CKTOTAL, CKMB, TROPONINI, RELINDX in the last 72 hours. Urinalysis    Component Value Date/Time   COLORURINE YELLOW 07/29/2014 0308   APPEARANCEUR CLOUDY* 07/29/2014 0308   LABSPEC 1.020 07/29/2014 0308   PHURINE 6.0 07/29/2014 0308   GLUCOSEU NEGATIVE 07/29/2014 0308   GLUCOSEU NEGATIVE 11/12/2013 1022   HGBUR SMALL* 07/29/2014 0308   BILIRUBINUR NEGATIVE 07/29/2014 0308   KETONESUR NEGATIVE 07/29/2014 0308   PROTEINUR NEGATIVE 07/29/2014 0308   UROBILINOGEN 0.2 07/29/2014 0308   NITRITE POSITIVE* 07/29/2014 0308   LEUKOCYTESUR MODERATE* 07/29/2014 0308    Urine Drug Screen     Component Value Date/Time   LABOPIA NONE DETECTED 07/29/2014 0308   COCAINSCRNUR NONE DETECTED 07/29/2014 0308   LABBENZ NONE DETECTED 07/29/2014 0308   AMPHETMU NONE DETECTED 07/29/2014 0308   THCU NONE DETECTED 07/29/2014 0308   LABBARB NONE DETECTED 07/29/2014 0308    Alcohol Level    Component Value Date/Time   ETH <11 07/28/2014 1936     SIGNIFICANT DIAGNOSTIC STUDIES Ct Head Wo Contrast 07/29/2014  1. Mildly increased size of the intra-axial hemorrhage located in the left thalamus near the posterior limb of the left internal capsule. Mild regional mass effect is stable.  2. Small volume left lateral intraventricular extension, no ventriculomegaly.  3. No new intracranial abnormality identified.   Mri Mra Head Wo Contrast 07/30/2014  1. Acute/subacute left thalamic hemorrhage without evidence for an underlying mass.  2. Atrophy and moderate diffuse white matter disease.  3. Moderate MCA branch vessel stenoses, including a proximal anterior division stenosis on the left.  4. Moderate more distal PCA branch vessel disease bilaterally.   Carotid Doppler  1-39% internal carotid artery stenosis bilaterally. The left vertebral artery is patent with antegrade flow, unable to visualize the right vertebral artery.  2-D echocardiogram 07/30/2014 LVEF 65-70%, mild LVH, normal wall motion, diastolic dysfunction with indeterminate LV filling pressure. Normal LA size, mild RAE, lipomatous hypertrophy of the IAS.     HISTORY OF PRESENT ILLNES Sherry Lambert is an 73 y.o. female with hypertension, peripheral vascular disease, diabetes mellitus, peripheral arterial disease and COPD experienced acute onset of weakness and numbness involving the right side at 6:45 PM today 07/28/2014. She has no previous history of stroke nor TIA. She's been taking aspirin daily. CT scan of her head showed an acute left thalamic hemorrhage with slight ventricular  extension. Patient did not experience a headache. Blood pressure was elevated requiring immediate intervention with labetalol IV. NIH stroke score was 5. Patient was not administered TPA secondary to hemorrhage. She was admitted to the neuro ICU for further evaluation and treatment.    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  Resultant Right hemiparesis and sensory deficit  MRI Left thalamic hemorrhage with left IVH  MRA Moderate intracranial atherosclerosis  Carotid Doppler No significant stenosis   2D Echo - unremarkable  LDL 83  Diet regular thin liquids  aspirin 81 mg orally every day prior to admission  Ongoing aggressive stroke risk factor management  Therapy recommendations: CIR   Disposition: inpatient rehabilitation   Hypertension  Home meds: Lisinopril  BP 180/86 on admission  SBP goal <180/90   Diabetes  HgbA1c 6.6 At goal < 7.0  Other Stroke Risk Factors  Advanced age  Former Cigarette smoker  PAD  Other Issues  UTI - culture positive for Escherichia coli - treated with 5 days of IV Rocephin    DISCHARGE EXAM Blood pressure 151/65, pulse 82, temperature 98.9 F (37.2 C), temperature source Oral, resp. rate 15, height 5' (1.524 m), weight 61.236 kg (135 lb), SpO2 95 %. Pleasant elderly caucasian lady not in distress. Awake alert. Afebrile. Head is nontraumatic. Neck is supple without bruit. Hearing is normal. Cardiac exam no murmur or gallop. Lungs are clear to auscultation. Distal pulses are well felt. Neurological Exam : Awake alert oriented 3 with normal speech and language function. Pupils are equal reactive. Fundi were not visualized. Visual fields are full to bedside confrontational testing. Mild right lower facial weakness. Tongue midline. Motor system exam revealed no  upper or lower drift. Mild weakness of right upper and lower extremities 3+-4/5. Mild subjective diminished touch and pinprick sensation on the right lower face arm and leg. Coordination appears intact, no dysmetria. Deep tendon reflexes are 2+ symmetric. Plantars are downgoing. Not able to test gait.   Discharge Diet  Diet regular thin liquids  DISCHARGE PLAN  Disposition:  Transfer to [place] for ongoing PT, OT and ST  Recommend ongoing risk factor control by Primary Care Physician at time of discharge from inpatient rehabilitation.  Follow-up No primary care provider on file. in 2 weeks following discharge from rehab.  Follow-up with [name], Stroke Clinic in 1 month.  35 minutes were spent preparing discharge.  [place] for Pager information 08/04/2014 3:13 PM   I have personally examined this patient, reviewed notes, independently viewed imaging studies, participated in medical decision making and plan of care. I have made any additions or clarifications directly to the above note. Agree with note above.   [name], MD Medical Director [facility] Pager: [phone] 08/04/2014 8:39 PM

## 2014-08-03 NOTE — Progress Notes (Signed)
Discharge orders received, pt for discharge CIR today, IV D/C.  Report called to Rehab nurse. Staff brought pt to Rehab via wheelchair.

## 2014-08-03 NOTE — Interval H&P Note (Signed)
Sherry Lambert was admitted today to Inpatient Rehabilitation with the diagnosis of left thalamic [name].  The patient's history has been reviewed, patient examined, and there is no change in status.  Patient continues to be appropriate for intensive inpatient rehabilitation.  I have reviewed the patient's chart and labs.  Questions were answered to the patient's satisfaction.  Zurisadai Helminiak T 08/03/2014, 10:03 PM

## 2014-08-03 NOTE — Clinical Social Work Note (Signed)
Clinical Social Worker has assessed patient and worked her up for SNF backup. CSW noted patient will transition to IP Rehab on today.  Clinical Social Worker will sign off for now as social work intervention is no longer needed. Please consult [place] again if new need arises.  Glendon Axe, MSW, LCSWA [phone] 08/03/2014 4:32 PM

## 2014-08-03 NOTE — Progress Notes (Signed)
I have insurance approval and will admit pt to inpt rehab today. Pt and her son are aware and in agreement. [phone]

## 2014-08-03 NOTE — H&P (Signed)
Physical Medicine and Rehabilitation Admission H&P    Chief Complaint  Patient presents with  . Code Stroke  : HPI: Sherry Lambert is a 73 y.o. female with hypertension, peripheral vascular disease, diabetes mellitus, peripheral arterial disease and COPD experienced acute onset of weakness and numbness involving the right side on 07/28/2014. She had been taking aspirin daily. 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 MRI showed acute subacute left thalamic hemorrhage. Carotid Dopplers no ICA stenosis.Echocardiogram ejection fraction 30% grade 1 diastolic dysfunction. Blood pressure was elevated requiring immediate intervention with labetalol IV. NIH stroke score was 5. Patient was not administered TPA secondary to hemorrhage. Lovenox was later initiated for DVT prophylaxis 08/03/2014. Patient is tolerating a regular diet. Placed on intravenous Rocephin 5 days for Escherichia coli UTI to be completed 08/04/2014. Physical and occupational therapy evaluations completed with recommendations of physical medicine rehabilitation consult. Patient was admitted for comprehensive rehabilitation program  ROS Review of Systems  Constitutional: Negative for fever and chills.  HENT: Negative for hearing loss.  Eyes: Negative for blurred vision, double vision and photophobia.  Respiratory: Negative for cough.  Cardiovascular: Negative for chest pain and palpitations.  Gastrointestinal: Negative for nausea and vomiting.  Genitourinary: Negative for dysuria.  Musculoskeletal: Negative for myalgias.  Skin: Negative for itching.  Neurological: Positive for tingling, sensory change and focal weakness. Negative for headaches     Past Medical History  Diagnosis Date  . Emphysema of lung   . GERD (gastroesophageal reflux disease)   . Hypertension   . [name]   . [name]     . Peripheral arterial disease   . Diabetes mellitus without complication    Past Surgical History  Procedure Laterality Date  . Tonsillectomy    . Abdominal hysterectomy    . Colonoscopy    . Esophagogastroduodenoscopy N/A 12/23/2012    Procedure: ESOPHAGOGASTRODUODENOSCOPY (EGD);  Surgeon: Inda Castle, MD;  Location: [facility];  Service: Endoscopy;  Laterality: N/A;  . Balloon dilation N/A 12/23/2012    Procedure: BALLOON DILATION;  Surgeon: Inda Castle, MD;  Location: [facility];  Service: Endoscopy;  Laterality: N/A;  . Esophagogastroduodenoscopy N/A 01/15/2013    Procedure: ESOPHAGOGASTRODUODENOSCOPY (EGD);  Surgeon: Inda Castle, MD;  Location: [facility];  Service: Endoscopy;  Laterality: N/A;  . Balloon dilation N/A 01/15/2013    Procedure: BALLOON DILATION;  Surgeon: Inda Castle, MD;  Location: [facility];  Service: Endoscopy;  Laterality: N/A;   Family History  Problem Relation Age of Onset  . Hypertension Father   . Diabetes Father   . Cancer Neg Hx   . Early death Neg Hx   . Stroke Neg Hx    Social History:  reports that she has quit smoking. Her smoking use included Cigarettes. She smoked 0.00 packs per day for 55 years. She has never used smokeless tobacco. She reports that she does not drink alcohol or use illicit drugs. Allergies: No Known Allergies Medications Prior to Admission  Medication Sig Dispense Refill  . aspirin 81 MG tablet Take 81 mg by mouth daily.    . Fluticasone-Salmeterol (ADVAIR) 100-50 MCG/DOSE AEPB Inhale 1 puff into the lungs 2 (two) times daily. 1 each 3  . lisinopril (PRINIVIL,ZESTRIL) 10 MG tablet Take 1 tablet (10 mg total) by mouth daily. 90 tablet 3    Home: Home Living Family/patient expects to be discharged  to:: Inpatient rehab Living Arrangements: Alone Available Help at Discharge: Family, Available 24 hours/day Type of Home: House Home Access: Stairs to enter [employer] of Steps: 2 Entrance  Stairs-Rails: None Home Layout: One level Home Equipment: None Additional Comments: daughter in law reports they have access to shower seat and RW.  Family lives behind and beside her and are able to provide 24 hour assist as needed at discharge.   Lives With: Alone   Functional History: Prior Function Level of Independence: Independent Comments: Pt drives, and was independent in the community setting   Functional Status:  Mobility: Bed Mobility Overal bed mobility: Needs Assistance Bed Mobility: Supine to Sit Supine to sit: Mod assist General bed mobility comments: Patient sitting up in recliner before and after session Transfers Overall transfer level: Needs assistance Equipment used: Hemi-walker Transfers: Sit to/from Stand Sit to Stand: Mod assist Stand pivot transfers: Mod assist, +2 physical assistance General transfer comment: Performed sit to stand x 2 with mod A to ensure balance and proper positioning with standing. Patient very close to only requiring min A.  Ambulation/Gait Ambulation/Gait assistance: Mod assist, +2 physical assistance Ambulation Distance (Feet): 50 Feet Assistive device: Hemi-walker Gait Pattern/deviations: Step-to pattern, Decreased weight shift to left, Decreased dorsiflexion - right, Ataxic, Wide base of support General Gait Details: Patient able to move R foot on her own this session. There were times that she went with the wrong gait sequence throwing herself off balance and requiring +2 mod A to prevent fall. Patient able to use hemi-walker very well with gait this session. R hand was slipping off RW and was unsafe for patient to use. Patient required two seated rest breaks    ADL: ADL Overall ADL's : Needs assistance/impaired Eating/Feeding: Sitting, Set up Eating/Feeding Details (indicate cue type and reason): using Lt. UE; max A with Rt. UE Grooming: Wash/dry hands, Wash/dry face, Oral care, Brushing hair, Minimal assistance,  Sitting Grooming Details (indicate cue type and reason): using Lt. UE Upper Body Bathing: Moderate assistance, Sitting Lower Body Bathing: Maximal assistance, Sit to/from stand Lower Body Bathing Details (indicate cue type and reason): Assist to bathe below knees, for perianal area and mod - max A for standing balance  Upper Body Dressing : Maximal assistance, Sitting Lower Body Dressing: Total assistance, Sit to/from stand Lower Body Dressing Details (indicate cue type and reason): Pt able to doff socks, using RUE for one and LUE for other while seated in recliner and crossing one leg over the other. Pt could not get socks started for donning process, she would grab it with her RUE but as she reached forward with LUE and RUE she would lose grip on it. I donned both socks 1/2 way and she finished process up and over heel with legs crossed and increased VCs to really focusing on squeezing the sock hard with RUE Toilet Transfer: Moderate assistance, +2 for physical assistance, Stand-pivot Toilet Transfer Details (indicate cue type and reason): Sit>stand from recliner Mod A , then we ambulated from recliner to toilet in bathroom with hemi-walker with pt's tendency to lean forward and she has to catapult her RLE forward with waxing and waning balance (Min -Max A) depending on what stage of ambulating she was in. Could not safely make in back from bathroom ambulating with +1 due to leaning right too much and could not self correct completely even with cues Toileting- Clothing Manipulation and Hygiene: Total assistance (with Mod A sit<.stand) Toileting - Clothing Manipulation Details (indicate cue type  and reason): Pt reliant on UE support to stand and not able to release one hand to attempt to wipe.  Unable to maintain balance and position self to access peri are in sitting  Functional mobility during ADLs: Moderate assistance, +2 for physical assistance, Rolling walker General ADL Comments: Family requested  OT assist pt with repositioning in chair  Cognition: Cognition Overall Cognitive Status: Within Functional Limits for tasks assessed Orientation Level: Oriented X4 Cognition Arousal/Alertness: Awake/alert Behavior During Therapy: WFL for tasks assessed/performed Overall Cognitive Status: Within Functional Limits for tasks assessed Area of Impairment: Safety/judgement, Problem solving Safety/Judgement: Decreased awareness of safety (decreased awareness that she leaning right ) Awareness: Intellectual Problem Solving: Difficulty sequencing, Requires verbal cues (for ambulation with hemi-walker) General Comments: Patient is more aware of R side this session and able to use with minimal cues  Physical Exam: Blood pressure 135/51, pulse 76, temperature 98 F (36.7 C), temperature source Oral, resp. rate 17, height 5' (1.524 m), weight 61.236 kg (135 lb), SpO2 96 %. Physical Exam Constitutional: She is oriented to person, place, and time. She appears well-developed and well-nourished.  HENT: oral mucosa pink and moist  Head: Normocephalic and atraumatic.  Right Ear: External ear normal.  Left Ear: External ear normal.  Eyes: Conjunctivae and EOM are normal. Pupils are equal, round, and reactive to light.  Neck: No JVD present. No tracheal deviation present. No thyromegaly present.  Cardiovascular: Normal rate.  Respiratory: No respiratory distress.  GI: She exhibits no distension. There is no tenderness.  Musculoskeletal: Normal range of motion.  Lymphadenopathy:   She has no cervical adenopathy.  Neurological: She is alert and oriented to person, place, and time. Mild sensory loss to LT/pain right face. Speech clear. Cognition appropriate. Appropriate with good insight and awareness. Speech clear. LUE 5/5. RUE: 3+ deltoid, bicep, 4- tricep, 4 wrist and hand. RLE: 4- HF, 4 ke an adf/apf. Sensation 1/2 RUE to PP and LT and tr to 1/2 RLE to PP and LT. DTR's trace.  Psychiatric: She has  a normal mood and affect. Her behavior is normal. Thought content normal   Results for orders placed or performed during the hospital encounter of 07/28/14 (from the past 48 hour(s))  Basic metabolic panel     Status: Abnormal   Collection Time: 08/01/14  3:50 PM  Result Value Ref Range   Sodium 138 137 - 147 mEq/L   Potassium 4.0 3.7 - 5.3 mEq/L   Chloride 101 96 - 112 mEq/L   CO2 24 19 - 32 mEq/L   Glucose, Bld 172 (H) 70 - 99 mg/dL   BUN 13 6 - 23 mg/dL   Creatinine, Ser 0.75 0.50 - 1.10 mg/dL   Calcium 9.3 8.4 - 10.5 mg/dL   GFR calc non Af Amer 83 (L) >90 mL/min   GFR calc Af Amer >90 >90 mL/min    Comment: (NOTE) The eGFR has been calculated using the CKD EPI equation. This calculation has not been validated in all clinical situations. eGFR's persistently <90 mL/min signify possible Chronic Kidney Disease.    Anion gap 13 5 - 15   No results found.     Medical Problem List and Plan: 1. 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  DVT Prophylaxis/Anticoagulation: Lovenox initiated 08/03/2014 for DVT prophylaxis. Monitor platelet counts of any signs of bleeding 3. Pain Management: Tylenol as needed 4. COPD.Dulera twice a day. 5. Neuropsych: This patient is capable of making decisions on her own behalf.  6. Skin/Wound Care: Routine skin checks 7. Fluids/Electrolytes/Nutrition: Strict I and O's. Follow-up labs 8. Hypertension. Lisinopril 10 mg daily. Monitor with increased mobility 9. Escherichia coli UTI. Complete Rocephin 08/04/2014. Denies any dysuria or hematuria    Post Admission Physician Evaluation: 1. 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 2. Patient is admitted to receive collaborative, interdisciplinary care between the physiatrist, rehab nursing staff, and therapy team. 3. Patient's level of medical complexity and substantial therapy needs in context of that medical necessity  cannot be provided at a lesser intensity of care such as a SNF. 4. Patient has experienced substantial functional loss from his/her baseline which was documented above under the "Functional History" and "Functional Status" headings.  Judging by the patient's diagnosis, physical exam, and functional history, the patient has potential for functional progress which will result in measurable gains while on inpatient rehab.  These gains will be of substantial and practical use upon discharge  in facilitating mobility and self-care at the household level. 5. Physiatrist will provide 24 hour management of medical needs as well as oversight of the therapy plan/treatment and provide guidance as appropriate regarding the interaction of the two. 6. 24 hour rehab nursing will assist with bladder management, bowel management, safety, skin/wound care, disease management, medication administration, pain management and patient education  and help integrate therapy concepts, techniques,education, etc. 7. PT will assess and treat for/with: Lower extremity strength, range of motion, stamina, balance, functional mobility, safety, adaptive techniques and equipment, NMR, visual-spatial awareness, proprioception, stroke education.   Goals are: mod I to supervision. 8. OT will assess and treat for/with: ADL's, functional mobility, safety, upper extremity strength, adaptive techniques and equipment, NMR, visual-spatial awareness, proprioception, stroke ed, ego support.   Goals are: mod I to supervision. Therapy may proceed with showering this patient. 9. SLP will assess and treat for/with: n/a.  Goals are: n/a. 10. Case Management and Social Worker will assess and treat for psychological issues and discharge planning. 11. Team conference will be held weekly to assess progress toward goals and to determine barriers to discharge. 12. Patient will receive at least 3 hours of therapy per day at least 5 days per week. 13. ELOS: 12-17  days       14. Prognosis:  excellent     Meredith Staggers, MD, [facility] 08/03/2014   08/03/2014

## 2014-08-04 ENCOUNTER — Inpatient Hospital Stay (HOSPITAL_COMMUNITY): Payer: Medicare HMO | Admitting: Speech Pathology

## 2014-08-04 ENCOUNTER — Inpatient Hospital Stay (HOSPITAL_COMMUNITY): Payer: Medicare HMO | Admitting: Occupational Therapy

## 2014-08-04 ENCOUNTER — Inpatient Hospital Stay (HOSPITAL_COMMUNITY): Payer: Medicare HMO | Admitting: Physical Therapy

## 2014-08-04 DIAGNOSIS — I69398 Other sequelae of cerebral infarction: Secondary | ICD-10-CM

## 2014-08-04 DIAGNOSIS — I69993 Ataxia following unspecified cerebrovascular disease: Secondary | ICD-10-CM

## 2014-08-04 DIAGNOSIS — G8190 Hemiplegia, unspecified affecting unspecified side: Secondary | ICD-10-CM

## 2014-08-04 DIAGNOSIS — I610 Nontraumatic intracerebral hemorrhage in hemisphere, subcortical: Secondary | ICD-10-CM

## 2014-08-04 DIAGNOSIS — R208 Other disturbances of skin sensation: Secondary | ICD-10-CM

## 2014-08-04 DIAGNOSIS — I69898 Other sequelae of other cerebrovascular disease: Secondary | ICD-10-CM

## 2014-08-04 DIAGNOSIS — R209 Unspecified disturbances of skin sensation: Secondary | ICD-10-CM

## 2014-08-04 LAB — CBC WITH DIFFERENTIAL/PLATELET
BASOS ABS: 0 10*3/uL (ref 0.0–0.1)
Basophils Relative: 0 % (ref 0–1)
EOS PCT: 3 % (ref 0–5)
Eosinophils Absolute: 0.3 10*3/uL (ref 0.0–0.7)
HCT: 41.8 % (ref 36.0–46.0)
Hemoglobin: 13 g/dL (ref 12.0–15.0)
LYMPHS ABS: 2.3 10*3/uL (ref 0.7–4.0)
Lymphocytes Relative: 24 % (ref 12–46)
MCH: 27.8 pg (ref 26.0–34.0)
MCHC: 31.1 g/dL (ref 30.0–36.0)
MCV: 89.5 fL (ref 78.0–100.0)
Monocytes Absolute: 0.9 10*3/uL (ref 0.1–1.0)
Monocytes Relative: 9 % (ref 3–12)
Neutro Abs: 6.1 10*3/uL (ref 1.7–7.7)
Neutrophils Relative %: 64 % (ref 43–77)
PLATELETS: 283 10*3/uL (ref 150–400)
RBC: 4.67 MIL/uL (ref 3.87–5.11)
RDW: 14 % (ref 11.5–15.5)
WBC: 9.6 10*3/uL (ref 4.0–10.5)

## 2014-08-04 LAB — COMPREHENSIVE METABOLIC PANEL
ALT: 15 U/L (ref 0–35)
AST: 15 U/L (ref 0–37)
Albumin: 3 g/dL — ABNORMAL LOW (ref 3.5–5.2)
Alkaline Phosphatase: 68 U/L (ref 39–117)
Anion gap: 11 (ref 5–15)
BUN: 12 mg/dL (ref 6–23)
CO2: 28 mEq/L (ref 19–32)
Calcium: 9.1 mg/dL (ref 8.4–10.5)
Chloride: 106 mEq/L (ref 96–112)
Creatinine, Ser: 0.78 mg/dL (ref 0.50–1.10)
GFR calc non Af Amer: 82 mL/min — ABNORMAL LOW (ref >90)
GLUCOSE: 115 mg/dL — AB (ref 70–99)
Potassium: 3.9 mEq/L (ref 3.7–5.3)
Sodium: 145 mEq/L (ref 137–147)
Total Bilirubin: 0.3 mg/dL (ref 0.3–1.2)
Total Protein: 6.1 g/dL (ref 6.0–8.3)

## 2014-08-04 LAB — GLUCOSE, CAPILLARY: Glucose-Capillary: 198 mg/dL — ABNORMAL HIGH (ref 70–99)

## 2014-08-04 MED ORDER — TRAZODONE HCL 50 MG PO TABS
25.0000 mg | ORAL_TABLET | Freq: Every evening | ORAL | Status: DC | PRN
  Administered 2014-08-04: 25 mg via ORAL
  Filled 2014-08-04: qty 1

## 2014-08-04 MED ORDER — TRAZODONE HCL 50 MG PO TABS
50.0000 mg | ORAL_TABLET | Freq: Every evening | ORAL | Status: DC | PRN
  Administered 2014-08-11 – 2014-08-19 (×6): 50 mg via ORAL
  Filled 2014-08-04 (×7): qty 1

## 2014-08-04 NOTE — Progress Notes (Signed)
Occupational Therapy Assessment and Plan  Patient Details  Name: Sherry Lambert MRN: [id number] Date of Birth: 02/27/1941  OT Diagnosis: disturbance of vision and hemiplegia affecting dominant side Rehab Potential:  Good for stated goals ELOS: 14-17 days   Today's Date: 08/04/2014 OT Individual Time: 1000-1100 OT Individual Time Calculation (min): 60 min     Problem List:  Patient Active Problem List   Diagnosis Date Noted  . 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 Nontraumatic intracerebral hemorrhage 08/03/2014  . Cerebral hemorrhage   . Cytotoxic brain edema   . Intracerebral hemorrhage 07/28/2014  . Hypertensive urgency 07/28/2014  . [name] (intracerebral hemorrhage) 07/28/2014  . Peripheral arterial disease 01/05/2014  . Osteopenia 11/12/2013  . Dyslipidemia 09/19/2013  . Stricture and stenosis of esophagus 12/23/2012  . Dysphagia, unspecified 12/01/2012  . [name] 12/01/2012  . DM type 2 goal A1C below 7.5 11/10/2012  . HTN (hypertension) 11/10/2012  . Other emphysema 11/10/2012    Past Medical History:  Past Medical History  Diagnosis Date  . Emphysema of lung   . GERD (gastroesophageal reflux disease)   . Hypertension   . [name]   . [name]   . Peripheral arterial disease   . Diabetes mellitus without complication    Past Surgical History:  Past Surgical History  Procedure Laterality Date  . Tonsillectomy    . Abdominal hysterectomy    . Colonoscopy    . Esophagogastroduodenoscopy N/A 12/23/2012    Procedure: ESOPHAGOGASTRODUODENOSCOPY (EGD);  Surgeon: Inda Castle, MD;  Location: [facility];  Service: Endoscopy;  Laterality: N/A;  . Balloon dilation N/A 12/23/2012    Procedure: BALLOON DILATION;  Surgeon: Inda Castle, MD;  Location: [facility];  Service: Endoscopy;  Laterality: N/A;  . Esophagogastroduodenoscopy N/A 01/15/2013     Procedure: ESOPHAGOGASTRODUODENOSCOPY (EGD);  Surgeon: Inda Castle, MD;  Location: [facility];  Service: Endoscopy;  Laterality: N/A;  . Balloon dilation N/A 01/15/2013    Procedure: BALLOON DILATION;  Surgeon: Inda Castle, MD;  Location: [facility];  Service: Endoscopy;  Laterality: N/A;    Assessment & Plan Clinical Impression: Patient is a 73 y.o. year old female with hypertension, peripheral vascular disease, diabetes mellitus, peripheral arterial disease and COPD experienced acute onset of weakness and numbness involving the right side on 07/28/2014. She had been taking aspirin daily. 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 MRI showed acute subacute left thalamic hemorrhage. Carotid Dopplers no ICA stenosis.Echocardiogram ejection fraction 74% grade 1 diastolic dysfunction. Blood pressure was elevated requiring immediate intervention with labetalol IV. NIH stroke score was 5. Patient was not administered TPA secondary to hemorrhage. Lovenox was later initiated for DVT prophylaxis 08/03/2014. Patient is tolerating a regular diet. Placed on intravenous Rocephin 5 days for Escherichia coli UTI to be completed 08/04/2014. Physical and occupational therapy evaluations completed with recommendations of physical medicine rehabilitation consult.  Patient transferred to CIR on 08/03/2014 .    Patient currently requires Min A- Total A with basic self-care skills secondary to muscle weakness, decreased coordination and decreased motor planning, decreased attention to right and decreased motor planning and decreased sitting balance, decreased standing balance, decreased postural control, hemiplegia and decreased balance strategies.  Prior to hospitalization, patient could complete ADLs and IADls with modified independent .  Patient will benefit from skilled intervention  to increase  independence with basic self-care skills prior to discharge home with care partner.  Anticipate patient will require intermittent supervision and follow up outpatient.      Skilled Therapeutic Intervention Upon entering the room, pt supine in bed with no c/o pain. OT educated pt on OT purpose, POC, and goals in which pt was agreeable. Bathing and dressing completed seated on EOB with pt requiring Mod - Max A for balance during functional activity. Transfer with Mod A to drop arm commode chair from bed. Toileting with total A secondary to pt requiring assistance for clothing management and hygiene this session. Please see written evaluation for full details. Pt seated in wheelchair with call bell and all other needed items within reach.   OT Evaluation Precautions/Restrictions  Precautions Precautions: Fall Precaution Comments: impulsive, posterior and R lateral lean Restrictions Weight Bearing Restrictions: No General Chart Reviewed: Yes Pain Pain Assessment Pain Assessment: No/denies pain Pain Score: 0-No pain Home Living/Prior Functioning Home Living Available Help at Discharge: Family, Available PRN/intermittently, Other (Comment) (Pt reports she may have someone to say with her 24/7 if needed at discharge) Type of Home: House Home Access: Stairs to enter [employer] of Steps: 2 Entrance Stairs-Rails: None Home Layout: One level Additional Comments: Pt has 1/2 bath with walk in shower and tub/shower combo with curtain. Pt may have access to shower seat if needed .  Lives With: Alone IADL History Homemaking Responsibilities: Yes Meal Prep Responsibility: Primary Laundry Responsibility: Primary Cleaning Responsibility: Primary Bill Paying/Finance Responsibility: Primary Shopping Responsibility: Primary Child Care Responsibility: No Mode of Transportation: Car Occupation: Retired Leisure and Hobbies: Pt reports going out with friends and out to eat Prior  Function Level of Independence: Independent with basic ADLs, Independent with homemaking with ambulation, Independent with gait  Able to Take Stairs?: Yes Driving: Yes Vocation: Retired Leisure: Hobbies-yes (Comment) Comments: loves to spend time with friends and go out to eat. Drives and is independent in community setting Vision/Perception  Vision- History Baseline Vision/History: No visual deficits Patient Visual Report: No change from baseline Vision- Assessment Vision Assessment?: Yes Eye Alignment: Within Functional Limits Ocular Range of Motion: Within Functional Limits Tracking/Visual Pursuits: Other (comment) Visual Fields: No apparent deficits Additional Comments: Pt with inattention to R side during B & D session  Cognition Overall Cognitive Status: Within Functional Limits for tasks assessed Arousal/Alertness: Awake/alert Orientation Level: Oriented X4 Attention: Selective Selective Attention: Appears intact Memory: Impaired Memory Impairment: Decreased recall of new information Awareness: Appears intact Problem Solving: Appears intact Safety/Judgment: Appears intact Sensation Sensation Light Touch: Impaired Detail Light Touch Impaired Details: Impaired RLE;Impaired RUE Stereognosis: Not tested Hot/Cold: Impaired Detail Hot/Cold Impaired Details: Impaired RUE;Impaired RLE Proprioception: Impaired Detail Proprioception Impaired Details: Absent RLE;Absent RUE Coordination Gross Motor Movements are Fluid and Coordinated: No Fine Motor Movements are Fluid and Coordinated: No Coordination and Movement Description: [facility] WFLs for L UE with speed and dexterity Motor  Motor Motor: Hemiplegia;Abnormal postural alignment and control Motor - Skilled Clinical Observations: R hemiplegia Mobility  Bed Mobility Bed Mobility: Supine to Sit;Sit to Supine Supine to Sit: 3: Mod assist;HOB flat Supine to Sit Details: Tactile cues for sequencing;Verbal cues for  sequencing;Verbal cues for technique Sit to Supine: 3: Mod assist;HOB flat Sit to Supine - Details: Tactile cues for sequencing;Verbal cues for technique;Verbal cues for sequencing Transfers Sit to Stand: 4: Min assist;From chair/3-in-1 Sit to Stand Details: Tactile cues for weight shifting;Tactile cues for weight beaing;Verbal cues for technique Stand to Sit: 4: Min assist;To  chair/3-in-1 Stand to Sit Details (indicate cue type and reason): Tactile cues for weight shifting;Tactile cues for weight beaing;Verbal cues for precautions/safety  Trunk/Postural Assessment  Cervical Assessment Cervical Assessment: Within Functional Limits Thoracic Assessment Thoracic Assessment: Exceptions to [facility] (kyphosis) Lumbar Assessment Lumbar Assessment: Exceptions to [facility] (posterior pelvic tilt) Postural Control Postural Control: Deficits on evaluation Trunk Control: R laterial trunk lean  Balance Static Standing Balance Static Standing - Balance Support: Left upper extremity supported Static Standing - Level of Assistance: 4: Min assist Dynamic Standing Balance Dynamic Standing - Balance Support: Left upper extremity supported Dynamic Standing - Level of Assistance: 3: Mod assist;2: Max assist Dynamic Standing - Balance Activities: Other (comment) Dynamic Standing - Comments: functional activity Extremity/Trunk Assessment RUE Assessment RUE Assessment: Within Functional Limits (strength is 3+/5 and AROM is WFLs) LUE Assessment LUE Assessment: Within Functional Limits  FIM:  FIM - Grooming Grooming Steps: Wash, rinse, dry face;Wash, rinse, dry hands;Oral care, brush teeth, clean dentures;Brush, comb hair Grooming: 5: Set-up assist to obtain items FIM - Bathing Bathing Steps Patient Completed: Chest;Right Arm;Left Arm;Abdomen;Front perineal area;Right upper leg;Left upper leg Bathing: 3: Mod-Patient completes 5-7 [id number] parts or 50-74% FIM - Upper Body Dressing/Undressing Upper body  dressing/undressing steps patient completed: Thread/unthread right sleeve of pullover shirt/dresss;Thread/unthread left sleeve of pullover shirt/dress;Put head through opening of pull over shirt/dress Upper body dressing/undressing: 4: Min-Patient completed 75 plus % of tasks FIM - Lower Body Dressing/Undressing Lower body dressing/undressing steps patient completed: Thread/unthread left underwear leg;Thread/unthread left pants leg Lower body dressing/undressing: 1: Total-Patient completed less than 25% of tasks FIM - Toileting Toileting: 1: Total-Patient completed zero steps, helper did all 3 FIM - BControl and instrumentation engineerDevices: Arm rests Bed/Chair Transfer: 3: Supine > Sit: Mod A (lifting assist/Pt. 50-74%/lift 2 legs;3: Bed > Chair or W/C: Mod A (lift or lower assist) FIM - TRadio producerDevices: Bedside commode Toilet Transfers: 3-To toilet/BSC: Mod A (lift or lower assist) FIM - Tub/Shower Transfers Tub/shower Transfers: 0-Activity did not occur or was simulated   Refer to Care Plan for Long Term Goals  Recommendations for other services: Other: Recreational therapy  Discharge Criteria: Patient will be discharged from OT if patient refuses treatment 3 consecutive times without medical reason, if treatment goals not met, if there is a change in medical status, if patient makes no progress towards goals or if patient is discharged from hospital.  The above assessment, treatment plan, treatment alternatives and goals were discussed and mutually agreed upon: by patient  PPhineas Semen12/10/2013, 11:44 AM

## 2014-08-04 NOTE — Evaluation (Signed)
Speech Language Pathology Assessment and Plan  Patient Details  Name: Sherry Lambert MRN: [id number] Date of Birth: [birth year]  Today's Date: 08/04/2014 SLP Individual Time: 0800-0900 SLP Individual Time Calculation (min): 60 min   Problem List:  Patient Active Problem List   Diagnosis Date Noted  . 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 Nontraumatic intracerebral hemorrhage 08/03/2014  . Cerebral hemorrhage   . Cytotoxic brain edema   . Intracerebral hemorrhage 07/28/2014  . Hypertensive urgency 07/28/2014  . [name] (intracerebral hemorrhage) 07/28/2014  . Peripheral arterial disease 01/05/2014  . Osteopenia 11/12/2013  . Dyslipidemia 09/19/2013  . Stricture and stenosis of esophagus 12/23/2012  . Dysphagia, unspecified 12/01/2012  . [name] 12/01/2012  . DM type 2 goal A1C below 7.5 11/10/2012  . HTN (hypertension) 11/10/2012  . Other emphysema 11/10/2012   Past Medical History:  Past Medical History  Diagnosis Date  . Emphysema of lung   . GERD (gastroesophageal reflux disease)   . Hypertension   . [name]   . [name]   . Peripheral arterial disease   . Diabetes mellitus without complication    Past Surgical History:  Past Surgical History  Procedure Laterality Date  . Tonsillectomy    . Abdominal hysterectomy    . Colonoscopy    . Esophagogastroduodenoscopy N/A 12/23/2012    Procedure: ESOPHAGOGASTRODUODENOSCOPY (EGD);  Surgeon: Inda Castle, MD;  Location: [facility];  Service: Endoscopy;  Laterality: N/A;  . Balloon dilation N/A 12/23/2012    Procedure: BALLOON DILATION;  Surgeon: Inda Castle, MD;  Location: [facility];  Service: Endoscopy;  Laterality: N/A;  . Esophagogastroduodenoscopy N/A 01/15/2013    Procedure: ESOPHAGOGASTRODUODENOSCOPY (EGD);  Surgeon: Inda Castle, MD;  Location: [facility];  Service: Endoscopy;  Laterality: N/A;   . Balloon dilation N/A 01/15/2013    Procedure: BALLOON DILATION;  Surgeon: Inda Castle, MD;  Location: [facility];  Service: Endoscopy;  Laterality: N/A;    Assessment / Plan / Recommendation Clinical Impression   Sherry Lambert is a 73 y.o. female with hypertension, peripheral vascular disease, diabetes mellitus, peripheral arterial disease and COPD experienced acute onset of weakness and numbness involving the right side on 07/28/2014. She had been taking aspirin daily. 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 MRI showed acute subacute left thalamic hemorrhage. Patient is tolerating a regular diet.  Physical and occupational therapy evaluations completed with recommendations of physical medicine rehabilitation consult. Patient was admitted for comprehensive rehabilitation program 08/03/2014.  SLP evaluation completed 08/04/2014 with the following results: Pt presents with age appropriate deficits for memory.  SLP administered the MoCA standardized assessment of cognitive function with results indicating below average function for visuoperceptual and executive function skills as well as for memory.  Pt was limited by use of non-dominant hand when completing pencil and paper portions of the evaluation; however, she demonstrated adequate error awareness and was responsive to subtle cues to correct her mistakes.  Pt endorses ongoing memory changes with age but reports that she is returned to her cognitive baseline.   Pt also presents with right oral-facial weakness with decreased sensation resulting in intermittent anterior loss of materials on the right side.  Pt is aware of oral motor deficits and self-corrects by alternating solid and liquid consistencies and taking small bites/sips.  No overt s/s of  aspiration noted at bedside.   No further ST needs are indicated at this time given that pt  is self managing her swallowing precautions and is returned to her cognitive baseline.  SLP recommended that pt have initial assistance for medication and financial management at discharge as well as at least intermittent supervision with higher level ADLs given her sensory/proprioceptive impairments which could impact her safety and functional independence at discharge.      Skilled Therapeutic Interventions          Cognitive-linguistic and bedside swallow evaluation completed with results and recommendations reviewed with patient.     SLP Assessment  Patient does not need any further Speech Lanaguage Pathology Services    Recommendations  Diet Recommendations: Regular;Thin liquid Liquid Administration via: Cup Medication Administration: Whole meds with liquid Supervision: Patient able to self feed Compensations: Slow rate;Small sips/bites Postural Changes and/or Swallow Maneuvers: Seated upright 90 degrees;Upright 30-60 min after meal Oral Care Recommendations: Oral care BID Follow up Recommendations: None Equipment Recommended: None recommended by SLP           Pain Pain Assessment Pain Assessment: No/denies pain Pain Score: 0-No pain Prior Functioning Cognitive/Linguistic Baseline: Within functional limits Type of Home: House  Lives With: Alone Available Help at Discharge: Family;Available PRN/intermittently Education: high school  Vocation: Retired   See FIM for current functional status Refer to [place] for [place]  Recommendations for other services: None  Discharge Criteria: Patient will be discharged from SLP if patient refuses treatment 3 consecutive times without medical reason, if treatment goals not met, if there is a change in medical status, if patient makes no progress towards goals or if patient is discharged from hospital.  The above assessment, treatment plan, treatment alternatives and goals were discussed and mutually agreed upon: by  patient  Emilio Math 08/04/2014, 12:32 PM

## 2014-08-04 NOTE — Evaluation (Signed)
Physical Therapy Assessment and Plan  Patient Details  Name: Sherry Lambert MRN: [id number] Date of Birth: 01-21-1941  PT Diagnosis: Abnormal posture, [name], Hemiplegia dominant, Impaired sensation and Muscle weakness Rehab Potential: Excellent ELOS: 14-17 days   Today's Date: 08/04/2014 PT Individual Time: 1300-1402 PT Individual Time Calculation (min): 62 min    Problem List:  Patient Active Problem List   Diagnosis Date Noted  . 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 Nontraumatic intracerebral hemorrhage 08/03/2014  . Cerebral hemorrhage   . Cytotoxic brain edema   . Intracerebral hemorrhage 07/28/2014  . Hypertensive urgency 07/28/2014  . [name] (intracerebral hemorrhage) 07/28/2014  . Peripheral arterial disease 01/05/2014  . Osteopenia 11/12/2013  . Dyslipidemia 09/19/2013  . Stricture and stenosis of esophagus 12/23/2012  . Dysphagia, unspecified 12/01/2012  . [name] 12/01/2012  . DM type 2 goal A1C below 7.5 11/10/2012  . HTN (hypertension) 11/10/2012  . Other emphysema 11/10/2012    Past Medical History:  Past Medical History  Diagnosis Date  . Emphysema of lung   . GERD (gastroesophageal reflux disease)   . Hypertension   . [name]   . [name]   . Peripheral arterial disease   . Diabetes mellitus without complication    Past Surgical History:  Past Surgical History  Procedure Laterality Date  . Tonsillectomy    . Abdominal hysterectomy    . Colonoscopy    . Esophagogastroduodenoscopy N/A 12/23/2012    Procedure: ESOPHAGOGASTRODUODENOSCOPY (EGD);  Surgeon: Inda Castle, MD;  Location: [facility];  Service: Endoscopy;  Laterality: N/A;  . Balloon dilation N/A 12/23/2012    Procedure: BALLOON DILATION;  Surgeon: Inda Castle, MD;  Location: [facility];  Service: Endoscopy;  Laterality: N/A;  . Esophagogastroduodenoscopy N/A  01/15/2013    Procedure: ESOPHAGOGASTRODUODENOSCOPY (EGD);  Surgeon: Inda Castle, MD;  Location: [facility];  Service: Endoscopy;  Laterality: N/A;  . Balloon dilation N/A 01/15/2013    Procedure: BALLOON DILATION;  Surgeon: Inda Castle, MD;  Location: [facility];  Service: Endoscopy;  Laterality: N/A;    Assessment & Plan Clinical Impression: SYNCERE KAMINSKI is a 73 y.o. female with hypertension, peripheral vascular disease, diabetes mellitus, peripheral arterial disease and COPD experienced acute onset of weakness and numbness involving the right side on 07/28/2014. She had been taking aspirin daily. 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 MRI showed acute subacute left thalamic hemorrhage. Carotid Dopplers no ICA stenosis.Echocardiogram ejection fraction 86% grade 1 diastolic dysfunction. Blood pressure was elevated requiring immediate intervention with labetalol IV. NIH stroke score was 5. Patient was not administered TPA secondary to hemorrhage. Lovenox was later initiated for DVT prophylaxis 08/03/2014. Patient is tolerating a regular diet. Placed on intravenous Rocephin 5 days for Escherichia coli UTI to be completed 08/04/2014. Patient transferred to CIR on 08/03/2014 .   Patient currently requires mod to +2A (for safety) with mobility secondary to muscle weakness, impaired timing and sequencing and unbalanced muscle activation, decreased midline orientation and decreased attention to right, decreased safety awareness and decreased memory and decreased sitting balance, decreased standing balance, decreased postural control, hemiplegia and decreased balance strategies.  Prior to hospitalization, patient was independent  with mobility and lived with Alone in a House home.  Home access is 2Stairs to enter.  Patient will benefit from  skilled PT intervention to maximize safe  functional mobility and minimize fall risk for planned discharge home alone.  Anticipate patient will benefit from follow up [place] at discharge.  PT - End of Session Activity Tolerance: Endurance does not limit participation in activity Endurance Deficit: No PT Assessment Rehab Potential (ACUTE/IP ONLY): Excellent Barriers to Discharge: Decreased caregiver support PT Patient demonstrates impairments in the following area(s): Balance;Endurance;Motor;Perception;Sensory;Safety PT Transfers Functional Problem(s): Bed Mobility;Bed to Chair;Car;Furniture;Floor PT Locomotion Functional Problem(s): Ambulation;Wheelchair Mobility;Stairs PT Plan PT Intensity: Minimum of 1-2 x/day ,45 to 90 minutes PT Frequency: 5 out of 7 days PT Duration Estimated Length of Stay: 14-17 days PT Treatment/Interventions: Training and development officer;Ambulation/gait training;Cognitive remediation/compensation;Disease management/prevention;Discharge planning;DME/adaptive equipment instruction;Functional mobility training;Patient/family education;Neuromuscular re-education;UE/LE Coordination activities;Visual/perceptual remediation/compensation;UE/LE Strength taining/ROM;Wheelchair propulsion/positioning;Stair training;Therapeutic Activities;Splinting/orthotics PT Transfers Anticipated Outcome(s): Mod I PT Locomotion Anticipated Outcome(s): Supervision to Mod I PT Recommendation Recommendations for Other Services: Other (comment) (None at this time) Follow Up Recommendations: Home health PT;None (Pending pt progress, no follow up vs. HHPT) Patient destination: Home Equipment Recommended: To be determined  Skilled Therapeutic Intervention PT evaluation performed. See below for detailed findings. Treatment initiated. Session focused on gait training and stair negotiation. See below for detailed description of assist/cueing required with transfers, w/c mobility, gait, and stairs. Educated pt on findings, goals, and plan of care.  Oriented pt to rehab unit, fall precautions. Pt verbalized understanding of all education and was in full agreement with plan of care. Departed with pt seated in w/c with and all needs within reach.  PT Evaluation Precautions/Restrictions Precautions Precautions: Fall Precaution Comments: impulsive, posterior and R lateral lean Restrictions Weight Bearing Restrictions: No General   Vital SignsTherapy Vitals Temp: 98.5 F (36.9 C) Temp Source: Oral Pulse Rate: 80 Resp: 17 BP: 127/60 mmHg Patient Position (if appropriate): Sitting Oxygen Therapy SpO2: 98 % O2 Device: Not Delivered Pain Pain Assessment Pain Assessment: No/denies pain Pain Score: 0-No pain Home Living/Prior Functioning Home Living Available Help at Discharge: Family;Available PRN/intermittently;Other (Comment) (Pt reports she may have someone to say with her 24/7 if needed at discharge) Type of Home: House Home Access: Stairs to enter [employer] of Steps: 2 Entrance Stairs-Rails: None Home Layout: One level Additional Comments: Pt has 1/2 bath with walk in shower and tub/shower combo with curtain. Pt may have access to shower seat if needed .  Lives With: Alone Prior Function Level of Independence: Independent with basic ADLs;Independent with homemaking with ambulation;Independent with gait;Independent with transfers  Able to Take Stairs?: Yes Driving: Yes Vocation: Retired Biomedical scientist: Retired from [employer]: Hobbies-yes (Comment) Comments: loves to spend time with friends and go out to eat. Drives and is independent in community setting. Cooks 3 days/week for son's business. Vision/Perception  Vision - Assessment Eye Alignment: Within Functional Limits Ocular Range of Motion: Within Functional Limits Tracking/Visual Pursuits: Other (comment) Additional Comments: Pt with inattention to R side during B & D session  Cognition Overall Cognitive Status: Within Functional  Limits for tasks assessed Arousal/Alertness: Awake/alert Orientation Level: Oriented X4 Attention: Selective Selective Attention: Appears intact Memory: Impaired Memory Impairment: Decreased recall of new information Awareness: Appears intact Problem Solving: Appears intact Safety/Judgment: Appears intact Sensation Sensation Light Touch: Impaired Detail Light Touch Impaired Details: Impaired RLE;Impaired RUE Proprioception: Impaired Detail Proprioception Impaired Details: Absent RLE;Absent RUE Coordination Heel Shin Test: Speed of RLE movement limited by weakness. Excursion of movement grossly equal bilaterally. Motor  Motor Motor: Hemiplegia;Abnormal postural alignment and control Motor - Skilled Clinical Observations: R hemiplegia  Mobility  Bed Mobility Bed Mobility: Supine to Sit;Sit to Supine Supine to Sit: 3: Mod assist;HOB flat Supine to Sit Details: Tactile cues for sequencing;Verbal cues for sequencing;Verbal cues for technique Sit to Supine: 3: Mod assist;HOB flat Sit to Supine - Details: Tactile cues for sequencing;Verbal cues for technique;Verbal cues for sequencing Transfers Transfers: Yes Sit to Stand: 4: Min assist;From chair/3-in-1 Sit to Stand Details: Tactile cues for weight shifting;Tactile cues for weight beaing;Verbal cues for technique Stand to Sit: 4: Min assist;To chair/3-in-1 Stand to Sit Details (indicate cue type and reason): Tactile cues for weight shifting;Tactile cues for weight beaing;Verbal cues for precautions/safety Stand Pivot Transfers: 3: Mod assist;With armrests Stand Pivot Transfer Details: Tactile cues for weight shifting;Verbal cues for technique;Verbal cues for precautions/safety Locomotion  Ambulation Ambulation: Yes Ambulation/Gait Assistance: Other (comment);3: Mod assist;1: +2 Total assist Ambulation Distance (Feet): 25 Feet Assistive device: Other (Comment) (L rail) Ambulation/Gait Assistance Details: Verbal cues for  precautions/safety;Tactile cues for posture;Verbal cues for sequencing;Tactile cues for placement;Tactile cues for weight shifting Ambulation/Gait Assistance Details: Pt ambulated x25' with LUE support at hallway hand raill with mod A (+2A for w/c follow). Gait Gait: Yes Gait Pattern: Impaired Gait Pattern: Step-to pattern;Decreased step length - left;Decreased stance time - right;Decreased dorsiflexion - right;Decreased weight shift to left;Left flexed knee in stance;Narrow base of support;Trunk rotated posteriorly on right Stairs / Additional Locomotion Stairs: Yes Stairs Assistance: 1: +2 Total assist Stairs Assistance Details: Tactile cues for weight shifting;Verbal cues for precautions/safety;Tactile cues for posture;Verbal cues for sequencing;Tactile cues for placement;Tactile cues for sequencing;Verbal cues for technique Stairs Assistance Details (indicate cue type and reason): Pt negotiated 5 stairs with L rail and step-to pattern and max A for stability, +2A for safety, max multimodal cueing for sequencing/technique and safety. Stair Management Technique: One rail Left;Step to pattern;Forwards Number of Stairs: 5 Architect: Yes Wheelchair Assistance: 3: Building surveyor Details: Tactile cues for weight beaing;Verbal cues for technique;Tactile cues for placement;Tactile cues for sequencing;Verbal cues for sequencing Wheelchair Propulsion: Left upper extremity;Left lower extremity Wheelchair Parts Management: Needs assistance Distance: 130  Trunk/Postural Assessment  Cervical Assessment Cervical Assessment: Within Functional Limits Thoracic Assessment Thoracic Assessment: Exceptions to [facility] (thoracic kyphosis) Lumbar Assessment Lumbar Assessment: Exceptions to [facility] (Posterior pelvic tilt) Postural Control Postural Control: Deficits on evaluation Trunk Control: In static standing, pt demonstrates lateral trunk lean to R side. With dynamic  standingt, pt with tendency to push trunk to R side. Righting Reactions: In standing, pt demonstrates delayed/ineffective hip strategy and absent ankle strategy. Postural Limitations: Limited bilat hip extension in standing.  Balance Balance Balance Assessed: Yes Static Standing Balance Static Standing - Balance Support: Left upper extremity supported Static Standing - Level of Assistance: 4: Min assist Dynamic Standing Balance Dynamic Standing - Balance Support: Left upper extremity supported Dynamic Standing - Level of Assistance: 3: Mod assist;2: Max assist Extremity Assessment  RLE Assessment RLE Assessment: Exceptions to [facility] RLE Strength RLE Overall Strength: Deficits Right Hip Flexion: 4/5 Right Knee Flexion: 4/5 Right Knee Extension: 4/5 Right Ankle Dorsiflexion: 3+/5 Right Ankle Plantar Flexion: 4/5 LLE Assessment LLE Assessment: Within Functional Limits  FIM:  FIM - Control and instrumentation engineer Devices: Arm rests Bed/Chair Transfer: 3: Supine > Sit: Mod A (lifting assist/Pt. 50-74%/lift 2 legs;3: Bed > Chair or W/C: Mod A (lift or lower assist);3: Chair or W/C > Bed: Mod A (lift or lower assist);3: Sit > Supine: Mod A (lifting assist/Pt. 50-74%/lift 2 legs) FIM - Locomotion: Wheelchair Distance: 130 Locomotion:  Wheelchair: 2: Travels 50 - 149 ft with moderate assistance (Pt: 50 - 74%) FIM - Locomotion: Ambulation Locomotion: Ambulation Assistive Devices: Other (comment) (L rail) Ambulation/Gait Assistance: Other (comment);3: Mod assist;1: +2 Total assist Locomotion: Ambulation: 1: Two helpers FIM - Locomotion: Stairs Locomotion: Scientist, physiological: Insurance account manager - 1 Locomotion: Stairs: 1: Two helpers   Refer to [employer] for Long Term Goals  Recommendations for other services: None  Discharge Criteria: Patient will be discharged from PT if patient refuses treatment 3 consecutive times without medical reason, if treatment goals not met, if  there is a change in medical status, if patient makes no progress towards goals or if patient is discharged from hospital.  The above assessment, treatment plan, treatment alternatives and goals were discussed and mutually agreed upon: by patient  Stefano Gaul 08/04/2014, 4:58 PM

## 2014-08-04 NOTE — Progress Notes (Signed)
Subjective/Complaints: 73 y.o. female with hypertension, peripheral vascular disease, diabetes mellitus, peripheral arterial disease and COPD experienced acute onset of weakness and numbness involving the right side on 07/28/2014. She had been taking aspirin daily. 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 MRI showed acute subacute left thalamic hemorrhage. Carotid Dopplers no ICA stenosis.Echocardiogram ejection fraction 09% grade 1 diastolic dysfunction. Blood pressure was elevated requiring immediate intervention with labetalol IV. NIH stroke score was 5  Working with SLP No pains, freq BM, bladder is ok  Review of Systems - Negative except as above Objective: Vital Signs: Blood pressure 128/60, pulse 82, temperature 98.2 F (36.8 C), temperature source Oral, resp. rate 18, height 5' (1.524 m), weight 61.78 kg (136 lb 3.2 oz), SpO2 95 %. No results found. Results for orders placed or performed during the hospital encounter of 08/03/14 (from the past 72 hour(s))  CBC     Status: None   Collection Time: 08/03/14  8:05 PM  Result Value Ref Range   WBC 10.3 4.0 - 10.5 K/uL   RBC 4.85 3.87 - 5.11 MIL/uL   Hemoglobin 14.1 12.0 - 15.0 g/dL   HCT 44.2 36.0 - 46.0 %   MCV 91.1 78.0 - 100.0 fL   MCH 29.1 26.0 - 34.0 pg   MCHC 31.9 30.0 - 36.0 g/dL   RDW 13.9 11.5 - 15.5 %   Platelets 282 150 - 400 K/uL  Creatinine, serum     Status: Abnormal   Collection Time: 08/03/14  8:05 PM  Result Value Ref Range   Creatinine, Ser 0.70 0.50 - 1.10 mg/dL   GFR calc non Af Amer 85 (L) >90 mL/min   GFR calc Af Amer >90 >90 mL/min    Comment: (NOTE) The eGFR has been calculated using the CKD EPI equation. This calculation has not been validated in all clinical situations. eGFR's persistently <90 mL/min signify possible Chronic Kidney Disease.   CBC WITH DIFFERENTIAL     Status: None    Collection Time: 08/04/14  4:53 AM  Result Value Ref Range   WBC 9.6 4.0 - 10.5 K/uL   RBC 4.67 3.87 - 5.11 MIL/uL   Hemoglobin 13.0 12.0 - 15.0 g/dL   HCT 41.8 36.0 - 46.0 %   MCV 89.5 78.0 - 100.0 fL   MCH 27.8 26.0 - 34.0 pg   MCHC 31.1 30.0 - 36.0 g/dL   RDW 14.0 11.5 - 15.5 %   Platelets 283 150 - 400 K/uL   Neutrophils Relative % 64 43 - 77 %   Neutro Abs 6.1 1.7 - 7.7 K/uL   Lymphocytes Relative 24 12 - 46 %   Lymphs Abs 2.3 0.7 - 4.0 K/uL   Monocytes Relative 9 3 - 12 %   Monocytes Absolute 0.9 0.1 - 1.0 K/uL   Eosinophils Relative 3 0 - 5 %   Eosinophils Absolute 0.3 0.0 - 0.7 K/uL   Basophils Relative 0 0 - 1 %   Basophils Absolute 0.0 0.0 - 0.1 K/uL  Comprehensive metabolic panel     Status: Abnormal   Collection Time: 08/04/14  4:53 AM  Result Value Ref Range   Sodium 145 137 - 147 mEq/L   Potassium 3.9 3.7 - 5.3 mEq/L   Chloride 106 96 - 112 mEq/L   CO2 28 19 - 32 mEq/L   Glucose, Bld 115 (H) 70 - 99 mg/dL   BUN  12 6 - 23 mg/dL   Creatinine, Ser 0.78 0.50 - 1.10 mg/dL   Calcium 9.1 8.4 - 10.5 mg/dL   Total Protein 6.1 6.0 - 8.3 g/dL   Albumin 3.0 (L) 3.5 - 5.2 g/dL   AST 15 0 - 37 U/L   ALT 15 0 - 35 U/L   Alkaline Phosphatase 68 39 - 117 U/L   Total Bilirubin 0.3 0.3 - 1.2 mg/dL   GFR calc non Af Amer 82 (L) >90 mL/min   GFR calc Af Amer >90 >90 mL/min    Comment: (NOTE) The eGFR has been calculated using the CKD EPI equation. This calculation has not been validated in all clinical situations. eGFR's persistently <90 mL/min signify possible Chronic Kidney Disease.    Anion gap 11 5 - 15     HEENT: normal Cardio: RRR and no murmur Resp: CTA B/L and unlabored GI: BS positive and NT,ND Extremity:  No Edema Skin:   Intact Neuro: Alert/Oriented, Cranial Nerve II-XII normal, Abnormal Sensory absent LT in Right hand and foot, Abnormal Motor 4/5 on Right side, 5/5 on left and Abnormal FMC Ataxic/ dec FMC Musc/Skel:  Normal Gen  NAD   Assessment/Plan: 1. 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 Physiatrist is providing close team supervision and 24 hour management of active medical problems listed below. Physiatrist and rehab team continue to assess barriers to discharge/monitor patient progress toward functional and medical goals. Team conference today please see physician documentation under team conference tab, met with team face-to-face to discuss problems,progress, and goals. Formulized individual treatment plan based on medical history, underlying problem and comorbidities. FIM:                   Comprehension Comprehension: 5-Understands complex 90% of the time/Cues < 10% of the time  Expression Expression: 5-Expresses complex 90% of the time/cues < 10% of the time  Social Interaction Social Interaction: 7-Interacts appropriately with others - No medications needed.        Medical Problem List and Plan: 1. 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 DVT Prophylaxis/Anticoagulation: Lovenox initiated 08/03/2014 for DVT prophylaxis. Monitor platelet counts of any signs of bleeding 3. Pain Management: Tylenol as needed 4. COPD.Dulera twice a day. 5. Neuropsych: This patient is capable of making decisions on her own behalf. 6. Skin/Wound Care: Routine skin checks 7. Fluids/Electrolytes/Nutrition: Strict I and O's. Follow-up labs 8. Hypertension. Lisinopril 10 mg daily. Monitor with increased mobility 9. Escherichia coli UTI. Complete Rocephin 08/04/2014. Denies any dysuria or hematuria LOS (Days) 1 A FACE TO FACE EVALUATION WAS PERFORMED  Michalle Rademaker E 08/04/2014, 8:23 AM

## 2014-08-04 NOTE — Plan of Care (Signed)
Problem: RH BOWEL ELIMINATION Goal: RH STG MANAGE BOWEL WITH ASSISTANCE STG Manage Bowel with Mod I Assistance.  Outcome: Progressing Goal: RH STG MANAGE BOWEL W/MEDICATION W/ASSISTANCE STG Manage Bowel with Medication with Mod I Assistance.  Outcome: Progressing  Problem: RH SKIN INTEGRITY Goal: RH STG MAINTAIN SKIN INTEGRITY WITH ASSISTANCE STG Maintain Skin Integrity With [place].  Outcome: Progressing  Problem: RH SAFETY Goal: RH STG ADHERE TO SAFETY PRECAUTIONS W/ASSISTANCE/DEVICE STG Adhere to Safety Precautions With Mod I Assistance/Device.  Outcome: Progressing  Problem: RH PAIN MANAGEMENT Goal: RH STG PAIN MANAGED AT OR BELOW PT'S PAIN GOAL < 4  Outcome: Progressing

## 2014-08-04 NOTE — Progress Notes (Signed)
Patient information reviewed and entered into eRehab system by Lajarvis Italiano, RN, CRRN, PPS Coordinator.  Information including medical coding and functional independence measure will be reviewed and updated through discharge.     Per nursing patient was given "Data Collection Information Summary for Patients in Inpatient Rehabilitation Facilities with attached "Privacy Act Statement-Health Care Records" upon admission.  

## 2014-08-04 NOTE — Care Management Note (Signed)
[facility] Individual Statement of Services  Patient Name:  Sherry Lambert  Date:  08/04/2014  Welcome to the [facility].  Our goal is to provide you with an individualized program based on your diagnosis and situation, designed to meet your specific needs.  With this comprehensive rehabilitation program, you will be expected to participate in at least 3 hours of rehabilitation therapies Monday-Friday, with modified therapy programming on the weekends.  Your rehabilitation program will include the following services:  Physical Therapy (PT), Occupational Therapy (OT), Speech Therapy (ST), 24 hour per day rehabilitation nursing, Case Management (Social Worker), Rehabilitation Medicine, Nutrition Services and Pharmacy Services  Weekly team conferences will be held on Wednesday to discuss your progress.  Your Social Worker will talk with you frequently to get your input and to update you on team discussions.  Team conferences with you and your family in attendance may also be held.  Expected length of stay: 14-17 days Overall anticipated outcome: supervision/mod/i level  Depending on your progress and recovery, your program may change. Your Social Worker will coordinate services and will keep you informed of any changes. Your Social Worker's name and contact numbers are listed  below.  The following services may also be recommended but are not provided by the [facility] will be made to provide these services after discharge if needed.  Arrangements include referral to agencies that provide these services.  Your insurance has been verified to be:  [employer] Your primary doctor is:  Vertell Novak  Pertinent information will be shared with your doctor and your insurance company.  Social Worker:  Ovidio Kin, [place] or ([phone]  Information discussed with and copy given to patient by: Elease Hashimoto, 08/04/2014, 9:33 AM

## 2014-08-05 ENCOUNTER — Inpatient Hospital Stay (HOSPITAL_COMMUNITY): Payer: Medicare HMO | Admitting: Physical Therapy

## 2014-08-05 ENCOUNTER — Encounter (HOSPITAL_COMMUNITY): Payer: Medicare HMO

## 2014-08-05 DIAGNOSIS — R270 Ataxia, unspecified: Secondary | ICD-10-CM

## 2014-08-05 NOTE — IPOC Note (Addendum)
Overall Plan of Care [facility]) Patient Details Name: Sherry Lambert MRN: [id number] DOB: 09/15/1940  Admitting Diagnosis: L CVA  Hospital Problems: Principal Problem:   Nontraumatic intracerebral hemorrhage Active Problems:   Ataxia S/P CVA   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)     Functional Problem List: Nursing Bladder, Bowel, Endurance, Medication Management, Nutrition, Pain, Safety, Skin Integrity  PT Balance, Endurance, Motor, Perception, Sensory, Safety  OT Balance, Sensory, Vision, Motor, Perception, Safety  SLP    TR Activity tolerance, functional mobility, balance, cognition, safety, pain, anxiety/stress        Basic ADL's: OT Eating, Grooming, Bathing, Dressing, Toileting     Advanced  ADL's: OT Simple Meal Preparation, Laundry, Light Housekeeping     Transfers: PT Bed Mobility, Bed to Chair, Car, Furniture, Futures trader, Metallurgist: PT Ambulation, Emergency planning/management officer, Stairs     Additional Impairments: OT Fuctional Use of Upper Extremity  SLP        TR      Anticipated Outcomes Item Anticipated Outcome  Self Feeding Mod I  Swallowing      Basic self-care  Mod I  Toileting  Mod I   Bathroom Transfers Mod I  Bowel/Bladder  continent with bowel and bladder modified independent level  Transfers  Mod I  Locomotion  Supervision to Mod I  Communication     Cognition     Pain  3 or less on scale of 1-10  Safety/Judgment  supervision   Therapy Plan: PT Intensity: Minimum of 1-2 x/day ,45 to 90 minutes PT Frequency: 5 out of 7 days PT Duration Estimated Length of Stay: 14-17 days OT Intensity: Minimum of 1-2 x/day, 45 to 90 minutes OT Frequency: 5 out of 7 days OT Duration/Estimated Length of Stay: 14-17 days  TR Duration/ELOS:  10 days TR Frequency:  Min 1 time per week >20 minutes           Team Interventions: Nursing Interventions Patient/Family Education, Bladder Management, Bowel  Management, Disease Management/Prevention, Pain Management, Discharge Planning, Skin Care/Wound Management, Medication Management, Psychosocial Support  PT interventions Balance/vestibular training, Ambulation/gait training, Cognitive remediation/compensation, Disease management/prevention, Discharge planning, DME/adaptive equipment instruction, Functional mobility training, Patient/family education, Neuromuscular re-education, UE/LE Coordination activities, Visual/perceptual remediation/compensation, UE/LE Strength taining/ROM, Wheelchair propulsion/positioning, Stair training, Therapeutic Activities, Splinting/orthotics  OT Interventions Balance/vestibular training, Discharge planning, Self Care/advanced ADL retraining, Patient/family education, Functional mobility training, Community reintegration, DME/adaptive equipment instruction, Psychosocial support, Therapeutic Exercise, Visual/perceptual remediation/compensation, UE/LE Coordination activities, Therapeutic Activities  SLP Interventions    TR Interventions Recreation/leisure participation, Balance/Vestibular training, functional mobility, therapeutic activities, UE/LE strength/coordination, cognitive retraining/compensation, w/c mobility, community reintegration, pt/family education, adaptive equipment instruction/use, discharge planning, psychosocial support  SW/CM Interventions      Team Discharge Planning: Destination: PT-Home ,OT- Home , SLP-  Projected Follow-up: PT-Home health PT, None (Pending pt progress, no follow up vs. HHPT), OT-  Outpatient OT, SLP-None Projected Equipment Needs: PT-To be determined, OT- To be determined, SLP-None recommended by SLP Equipment Details: PT- , OT-  Patient/family involved in discharge planning: PT- Patient,  OT-Patient, SLP-Patient  MD ELOS: 12-17d Medical Rehab Prognosis:  Good Assessment: 73 y.o. female   with hypertension, peripheral vascular disease, diabetes mellitus, peripheral arterial  disease and COPD experienced acute onset of weakness and numbness involving the right side on 07/28/2014.   She had been taking aspirin daily. CT scan of her head showed an acute left thalamic hemorrhage near the posterior limb of the internal capsule  with slight intraventricular extension. MRI showed acute subacute left thalamic hemorrhage. Carotid Dopplers no ICA stenosis. Echocardiogram ejection fraction 80% grade 1 diastolic dysfunction.  Blood pressure was elevated requiring immediate intervention with labetalol IV. NIH stroke score was 5  Now requiring 24/7 Rehab RN,MD, as well as CIR level PT, OT and SLP.  Treatment team will focus on ADLs and mobility with goals set at [facility] I/Sup     See Team Conference Notes for weekly updates to the plan of care

## 2014-08-05 NOTE — Plan of Care (Signed)
Problem: Consults Goal: RH STROKE PATIENT EDUCATION See Patient Education module for education specifics  Outcome: Progressing Goal: Diabetes Guidelines if Diabetic/Glucose > 140 If diabetic or lab glucose is > 140 mg/dl - Initiate Diabetes/Hyperglycemia Guidelines & Document Interventions  Outcome: Not Applicable Date Met:  08/05/14  Problem: RH BOWEL ELIMINATION Goal: RH STG MANAGE BOWEL WITH ASSISTANCE STG Manage Bowel with Mod I Assistance.  Outcome: Progressing Goal: RH STG MANAGE BOWEL W/MEDICATION W/ASSISTANCE STG Manage Bowel with Medication with Mod I Assistance.  Outcome: Progressing  Problem: RH SKIN INTEGRITY Goal: RH STG MAINTAIN SKIN INTEGRITY WITH ASSISTANCE STG Maintain Skin Integrity With Min Assistance.  Outcome: Progressing  Problem: RH SAFETY Goal: RH STG ADHERE TO SAFETY PRECAUTIONS W/ASSISTANCE/DEVICE STG Adhere to Safety Precautions With Mod I Assistance/Device.  Outcome: Progressing  Problem: RH PAIN MANAGEMENT Goal: RH STG PAIN MANAGED AT OR BELOW PT'S PAIN GOAL < 4  Outcome: Progressing     

## 2014-08-05 NOTE — Progress Notes (Signed)
Social Work Assessment and Plan Social Work Assessment and Plan  Patient Details  Name: Sherry Lambert MRN: [id number] Date of Birth: 1941-07-19  Today's Date: 08/05/2014  Problem List:  Patient Active Problem List   Diagnosis Date Noted  . 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 Nontraumatic intracerebral hemorrhage 08/03/2014  . Cerebral hemorrhage   . Cytotoxic brain edema   . Intracerebral hemorrhage 07/28/2014  . Hypertensive urgency 07/28/2014  . [name] (intracerebral hemorrhage) 07/28/2014  . Peripheral arterial disease 01/05/2014  . Osteopenia 11/12/2013  . Dyslipidemia 09/19/2013  . Stricture and stenosis of esophagus 12/23/2012  . Dysphagia, unspecified 12/01/2012  . [name] 12/01/2012  . DM type 2 goal A1C below 7.5 11/10/2012  . HTN (hypertension) 11/10/2012  . Other emphysema 11/10/2012   Past Medical History:  Past Medical History  Diagnosis Date  . Emphysema of lung   . GERD (gastroesophageal reflux disease)   . Hypertension   . [name]   . [name]   . Peripheral arterial disease   . Diabetes mellitus without complication    Past Surgical History:  Past Surgical History  Procedure Laterality Date  . Tonsillectomy    . Abdominal hysterectomy    . Colonoscopy    . Esophagogastroduodenoscopy N/A 12/23/2012    Procedure: ESOPHAGOGASTRODUODENOSCOPY (EGD);  Surgeon: Inda Castle, MD;  Location: [facility];  Service: Endoscopy;  Laterality: N/A;  . Balloon dilation N/A 12/23/2012    Procedure: BALLOON DILATION;  Surgeon: Inda Castle, MD;  Location: [facility];  Service: Endoscopy;  Laterality: N/A;  . Esophagogastroduodenoscopy N/A 01/15/2013    Procedure: ESOPHAGOGASTRODUODENOSCOPY (EGD);  Surgeon: Inda Castle, MD;  Location: [facility];  Service: Endoscopy;  Laterality: N/A;  . Balloon dilation N/A 01/15/2013    Procedure: BALLOON  DILATION;  Surgeon: Inda Castle, MD;  Location: [facility];  Service: Endoscopy;  Laterality: N/A;   Social History:  reports that she has quit smoking. Her smoking use included Cigarettes. She smoked 0.00 packs per day for 55 years. She has never used smokeless tobacco. She reports that she does not drink alcohol or use illicit drugs.  Family / Support Systems Marital Status: Divorced Patient Roles: Parent Children: Orpah Greek Salameh-son  [phone]  [phone] Other Supports: D-I-L Anticipated Caregiver: Will need to come up with a caregiver Ability/Limitations of Caregiver: Both son and daugher in-law work druing the day 7;30-6;30 pm M-F Caregiver Availability: Other (Comment) (Coming up with a plan fro discharge) Family Dynamics: Close knit family-live next door to one another.  See's each other daily and pt cooks for [employer].  Pt was active prior to admission and wants to regain her independence while here.  Social History Preferred language: English Religion: Baptist Cultural Background: No issues Education: [employer] Read: Yes Write: Yes Employment Status: Retired Freight forwarder Issues: No issues Guardian/Conservator: None-according to MD pt is capable of making her own decisions while here.   Abuse/Neglect Physical Abuse: Denies Verbal Abuse: Denies Sexual Abuse: Denies Exploitation of patient/patient's resources: Denies Self-Neglect: Denies  Emotional Status Pt's affect, behavior adn adjustment status: Pt is motivated to regain her independence, she is active with her cooking and lived alone and took care of all of her needs.  She has been on her own for a while now after her divorce.  Her son is very supportive and involved. Recent Psychosocial Issues: healthy PTA-though health issues were being  managed Pyschiatric History: No history deferred depression screen due to feels she is doing well and it is not necessary at this time.  Will monitor  her while here and intervene if necessary. Substance Abuse History: No issues  Patient / Family Perceptions, Expectations & Goals Pt/Family understanding of illness & functional limitations: Pt and son have a good understanding of her stroke and deficits.  Pt and son have spoken with MD and feel their questions and concerns are being addressed.  Pt is one to ask if hse has a question or concern. Premorbid pt/family roles/activities: Mother, Retiree, [place], Home owner, [employer] member, etc Anticipated changes in roles/activities/participation: resume Pt/family expectations/goals: Pt states: " I want to be able to take care of myself., I don;t want to burden anyone they all work long hours."  Son states: " We will do what she needs but have to work."  [employer]: None Premorbid Home Care/DME Agencies: None Transportation available at discharge: family now Resource referrals recommended: Support group (specify)  Discharge Planning Living Arrangements: Alone Support Systems: Children, Water engineer, Immunologist, Other relatives Type of Residence: Private residence Insurance underwriter Resources: Multimedia programmer (specify) Scientist, clinical (histocompatibility and immunogenetics) Medicare) Financial Resources: Radio broadcast assistant Screen Referred: No Living Expenses: Own Money Management: Patient Does the patient have any problems obtaining your medications?: No Home Management: Self Patient/Family Preliminary Plans: Return home with family working on 24 hr care for short time.  Disucssed she is not always aware of where her right side is.  The hope is this will get better while here.  Son will see if any other family members can assist while they work-pt reports her niece is not working right now. Aware of estimated los and goals. Social Work Anticipated Follow Up Needs: HH/OP, Support Group  Clinical Impression Pleasant motivated patient who is willing to work and regain as much of her function as  possible while here.  Her son and daughter in-law are involved and supportive, but work long hours-7;30-6;30 daily. They are aware of the team's recommendation of 24 hr care upon discharge and will begin looking for coverage.  Will have definite discharge date at next conference. Work on a safe discharge plan.  Elease Hashimoto 08/05/2014, 10:03 AM

## 2014-08-05 NOTE — Progress Notes (Signed)
Physical Therapy Session Note  Patient Details  Name: Sherry Lambert MRN: [id number] Date of Birth: [birth year]  Today's Date: 08/05/2014 PT Individual Time: 0830-0930 and 1300-1400 PT Individual Time Calculation (min): 60 min and 60 min  Short Term Goals: Week 1:  PT Short Term Goal 1 (Week 1): Pt will perform supine<>sit with supervision with HOB flat and no rails. PT Short Term Goal 2 (Week 1): Pt will transfer from bed<>w/c with min A and 25% cueing for sequencing, technique. PT Short Term Goal 3 (Week 1): Pt will perform gait x50' with LRAD and mod A of single therapist. PT Short Term Goal 4 (Week 1): Pt will perform w/c mobility x150' with supervision and 25% cueing for technique, obstacle negotiation. PT Short Term Goal 5 (Week 1): Pt will perform static standing x60 seconds without UE support with close supervision to min guard and no overt LOB.  Skilled Therapeutic Interventions/Progress Updates:    Treatment Session 1: Pt received seated in w/c; agreeable to therapy. Assisted pt in donning pants, shirt, Teds, and shoes. Pt peformed w/c mobility x150' in controlled environment with min A and mod multimodal cueing for technique, obstacle negotiation. In treatment gym, pt performed blocked practice of squat pivot transfers from w/c<>mat table with min A and max cueing with focus on w/c parts management, setup, and full anterior weight shift. During stand pivot transfer, pt required mod A, manual assist for advancement, placement of RLE during pivoting. See below for detailed description of NMR interventions. Following NMR, pt performed gait x25' in controlled environment with mod A of single therapist. Session ended in pt room, where pt was left seated in w/c with all needs within reach.  Treatment Session 2: Pt received seated in w/c; agreeable to therapy. Session focused on gait training using LiteGait over treadmill. Pt performed w/c mobility x200' in controlled environment with L hemi  technique and supervision, cueing for obstacle negotiation on R side. See below for detailed description of gait training interventions performed on LiteGait. Following LiteGait, pt performed gait x30' in controlled environment with L rail and mod A of single therapist, manual correction/placement of L step (secondary to LLE adduction), tactile cueing for L stance stability, verbal cueing for upright posture, and verbal cueing for increased RLE step length. Session ended in pt room, where pt was left seated in w/c with all needs within reach.  Therapy Documentation Precautions:  Precautions Precautions: Fall Precaution Comments: impulsive, R inattention Restrictions Weight Bearing Restrictions: No Vital Signs: Oxygen Therapy SpO2: 95 % O2 Device: Not Delivered Pain: Pain Assessment Pain Assessment: No/denies pain Locomotion : Ambulation Ambulation/Gait Assistance: 4: Min assist Wheelchair Mobility Distance: 150  NMR:  Neuromuscular Facilitation: Activity to increase motor control;Activity to increase sustained activation;Activity to increase lateral weight shifting;Activity to increase anterior-posterior weight shifting Weight Bearing Technique Weight Bearing Technique: Yes RUE Weight Bearing Technique: High kneeling Pt performed tall kneeling with RUE support at [facility] bench to increase proximal stability, RUE/LE activation and proprioception, to facilitate midline orientation, and to increase stability with functional weight shifting. While in tall kneeling, pt performed lateral reaching with LUE to place rings on posts placed to L of pt. Pt required min to mod A for stability/balance, manual facilitation of R elbow extension, and max verbal/tactile cueing for upright posture and slow, graded movement.  See FIM for current functional status  Therapy/Group: Individual Therapy  Hobble, Malva Cogan 08/05/2014, 3:09 PM

## 2014-08-05 NOTE — Progress Notes (Signed)
Subjective/Complaints: 73 y.o. female with hypertension, peripheral vascular disease, diabetes mellitus, peripheral arterial disease and COPD experienced acute onset of weakness and numbness involving the right side on 07/28/2014. She had been taking aspirin daily. 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 MRI showed acute subacute left thalamic hemorrhage. Carotid Dopplers no ICA stenosis.Echocardiogram ejection fraction 17% grade 1 diastolic dysfunction. Blood pressure was elevated requiring immediate intervention with labetalol IV. NIH stroke score was 5  " I have a [facility] primary [facility] at [facility]"  Review of Systems - Negative except as above Objective: Vital Signs: Blood pressure 109/62, pulse 79, temperature 98.3 F (36.8 C), temperature source Oral, resp. rate 16, height 5' (1.524 m), weight 61.78 kg (136 lb 3.2 oz), SpO2 95 %. No results found. Results for orders placed or performed during the hospital encounter of 08/03/14 (from the past 72 hour(s))  CBC     Status: None   Collection Time: 08/03/14  8:05 PM  Result Value Ref Range   WBC 10.3 4.0 - 10.5 K/uL   RBC 4.85 3.87 - 5.11 MIL/uL   Hemoglobin 14.1 12.0 - 15.0 g/dL   HCT 44.2 36.0 - 46.0 %   MCV 91.1 78.0 - 100.0 fL   MCH 29.1 26.0 - 34.0 pg   MCHC 31.9 30.0 - 36.0 g/dL   RDW 13.9 11.5 - 15.5 %   Platelets 282 150 - 400 K/uL  Creatinine, serum     Status: Abnormal   Collection Time: 08/03/14  8:05 PM  Result Value Ref Range   Creatinine, Ser 0.70 0.50 - 1.10 mg/dL   GFR calc non Af Amer 85 (L) >90 mL/min   GFR calc Af Amer >90 >90 mL/min    Comment: (NOTE) The eGFR has been calculated using the CKD EPI equation. This calculation has not been validated in all clinical situations. eGFR's persistently <90 mL/min signify possible Chronic Kidney Disease.   CBC WITH DIFFERENTIAL     Status: None   Collection  Time: 08/04/14  4:53 AM  Result Value Ref Range   WBC 9.6 4.0 - 10.5 K/uL   RBC 4.67 3.87 - 5.11 MIL/uL   Hemoglobin 13.0 12.0 - 15.0 g/dL   HCT 41.8 36.0 - 46.0 %   MCV 89.5 78.0 - 100.0 fL   MCH 27.8 26.0 - 34.0 pg   MCHC 31.1 30.0 - 36.0 g/dL   RDW 14.0 11.5 - 15.5 %   Platelets 283 150 - 400 K/uL   Neutrophils Relative % 64 43 - 77 %   Neutro Abs 6.1 1.7 - 7.7 K/uL   Lymphocytes Relative 24 12 - 46 %   Lymphs Abs 2.3 0.7 - 4.0 K/uL   Monocytes Relative 9 3 - 12 %   Monocytes Absolute 0.9 0.1 - 1.0 K/uL   Eosinophils Relative 3 0 - 5 %   Eosinophils Absolute 0.3 0.0 - 0.7 K/uL   Basophils Relative 0 0 - 1 %   Basophils Absolute 0.0 0.0 - 0.1 K/uL  Comprehensive metabolic panel     Status: Abnormal   Collection Time: 08/04/14  4:53 AM  Result Value Ref Range   Sodium 145 137 - 147 mEq/L   Potassium 3.9 3.7 - 5.3 mEq/L   Chloride 106 96 - 112 mEq/L   CO2 28 19 - 32 mEq/L   Glucose, Bld 115 (H) 70 - 99 mg/dL   BUN 12  6 - 23 mg/dL   Creatinine, Ser 0.78 0.50 - 1.10 mg/dL   Calcium 9.1 8.4 - 10.5 mg/dL   Total Protein 6.1 6.0 - 8.3 g/dL   Albumin 3.0 (L) 3.5 - 5.2 g/dL   AST 15 0 - 37 U/L   ALT 15 0 - 35 U/L   Alkaline Phosphatase 68 39 - 117 U/L   Total Bilirubin 0.3 0.3 - 1.2 mg/dL   GFR calc non Af Amer 82 (L) >90 mL/min   GFR calc Af Amer >90 >90 mL/min    Comment: (NOTE) The eGFR has been calculated using the CKD EPI equation. This calculation has not been validated in all clinical situations. eGFR's persistently <90 mL/min signify possible Chronic Kidney Disease.    Anion gap 11 5 - 15  Glucose, capillary     Status: Abnormal   Collection Time: 08/04/14  4:47 PM  Result Value Ref Range   Glucose-Capillary 198 (H) 70 - 99 mg/dL     HEENT: normal Cardio: RRR and no murmur Resp: CTA B/L and unlabored GI: BS positive and NT,ND Extremity:  No Edema Skin:   Intact Neuro: Alert/Oriented, Cranial Nerve II-XII normal, Abnormal Sensory absent LT in Right hand and  foot, Abnormal Motor 4/5 on Right side, 5/5 on left and Abnormal FMC Ataxic/ dec FMC Musc/Skel:  Normal Gen NAD   Assessment/Plan: 1. 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 Physiatrist is providing close team supervision and 24 hour management of active medical problems listed below. Physiatrist and rehab team continue to assess barriers to discharge/monitor patient progress toward functional and medical goals.  FIM: FIM - Bathing Bathing Steps Patient Completed: Chest, Right Arm, Left Arm, Abdomen, Front perineal area, Right upper leg, Left upper leg Bathing: 3: Mod-Patient completes 5-7 32f10 parts or 50-74%  FIM - Upper Body Dressing/Undressing Upper body dressing/undressing steps patient completed: Thread/unthread right sleeve of pullover shirt/dresss, Thread/unthread left sleeve of pullover shirt/dress, Put head through opening of pull over shirt/dress Upper body dressing/undressing: 4: Min-Patient completed 75 plus % of tasks FIM - Lower Body Dressing/Undressing Lower body dressing/undressing steps patient completed: Thread/unthread left underwear leg, Thread/unthread left pants leg Lower body dressing/undressing: 1: Total-Patient completed less than 25% of tasks  FIM - Toileting Toileting: 1: Total-Patient completed zero steps, helper did all 3  FIM - TRadio producerDevices: Bedside commode Toilet Transfers: 3-To toilet/BSC: Mod A (lift or lower assist)  FIM - BControl and instrumentation engineerDevices: Arm rests Bed/Chair Transfer: 3: Supine > Sit: Mod A (lifting assist/Pt. 50-74%/lift 2 legs, 3: Bed > Chair or W/C: Mod A (lift or lower assist), 3: Chair or W/C > Bed: Mod A (lift or lower assist), 3: Sit > Supine: Mod A (lifting assist/Pt. 50-74%/lift 2 legs)  FIM - Locomotion:  Wheelchair Distance: 130 Locomotion: Wheelchair: 2: Travels 50 - 149 ft with moderate assistance (Pt: 50 - 74%) FIM - Locomotion: Ambulation Locomotion: Ambulation Assistive Devices: Other (comment) (L rail) Ambulation/Gait Assistance: Other (comment), 3: Mod assist, 1: +2 Total assist Locomotion: Ambulation: 1: Two helpers  Comprehension Comprehension Mode: Auditory Comprehension: 5-Understands complex 90% of the time/Cues < 10% of the time  Expression Expression Mode: Verbal Expression: 5-Expresses complex 90% of the time/cues < 10% of the time  Social Interaction Social Interaction: 7-Interacts appropriately with others - No medications needed.  Problem Solving Problem Solving: 5-Solves  complex 90% of the time/cues < 10% of the time  Memory Memory: 5-Recognizes or recalls 90% of the time/requires cueing < 10% of the time  Medical Problem List and Plan: 1. 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 DVT Prophylaxis/Anticoagulation: Lovenox initiated 08/03/2014 for DVT prophylaxis. Monitor platelet counts of any signs of bleeding 3. Pain Management: Tylenol as needed 4. COPD.Dulera twice a day. 5. Neuropsych: This patient is capable of making decisions on her own behalf. 6. Skin/Wound Care: Routine skin checks 7. Fluids/Electrolytes/Nutrition: Strict I and O's. Follow-up labs 8. Hypertension. Lisinopril 10 mg daily. Monitor with increased mobility 9. Escherichia coli UTI. Complete Rocephin 08/04/2014. Monitor reccurence LOS (Days) 2 A FACE TO FACE EVALUATION WAS PERFORMED  Juelle Dickmann E 08/05/2014, 8:23 AM

## 2014-08-05 NOTE — Progress Notes (Signed)
Occupational Therapy Session Note  Patient Details  Name: Sherry Lambert MRN: [id number] Date of Birth: 09-Nov-1940  Today's Date: 08/05/2014 OT Individual Time: 1103-1203 OT Individual Time Calculation (min): 60 min    Short Term Goals: Week 1:  OT Short Term Goal 1 (Week 1): Pt will perform grooming while standing at sink side with Min A balance during self care task. OT Short Term Goal 2 (Week 1): Pt will perform shower transfer with Mod A in order to decrease assistance with transfers. OT Short Term Goal 3 (Week 1): Pt will perform LB dressing with Mod A in order to increase I with self care. OT Short Term Goal 4 (Week 1): Pt will perform UB dressing with set up A in order to increase I with self care task.  Skilled Therapeutic Interventions/Progress Updates:    Pt engaged in BADL retraining including bathing at shower level and dressing with sit<>stand from w/c.  Pt exhibited decreased proprioception and decreased gross and fine motor control of RUE throughout session.  Pt required max verbal cues for attention to RUE and RLE.  Pt exhibited decreased safety awareness and increased impulsivity during session.  Pt required mod A for standing balance and max verbal cues for RLE placement when standing.  Focus on activity tolerance, increased attention to RUE/RLE, sit<>stand, standing balance, transfers, and safety awareness.  Therapy Documentation Precautions:  Precautions Precautions: Fall Precaution Comments: impulsive, R inattention Restrictions Weight Bearing Restrictions: No Pain: Pain Assessment Pain Assessment: No/denies pain  See FIM for current functional status  Therapy/Group: Individual Therapy  Leroy Libman 08/05/2014, 12:04 PM

## 2014-08-06 ENCOUNTER — Inpatient Hospital Stay (HOSPITAL_COMMUNITY): Payer: Medicare HMO | Admitting: Physical Therapy

## 2014-08-06 ENCOUNTER — Encounter (HOSPITAL_COMMUNITY): Payer: Medicare HMO

## 2014-08-06 DIAGNOSIS — I618 Other nontraumatic intracerebral hemorrhage: Secondary | ICD-10-CM

## 2014-08-06 MED ORDER — DIPHENHYDRAMINE HCL 25 MG PO CAPS
ORAL_CAPSULE | ORAL | Status: AC
  Filled 2014-08-06: qty 1

## 2014-08-06 MED ORDER — DIPHENHYDRAMINE HCL 25 MG PO CAPS
25.0000 mg | ORAL_CAPSULE | Freq: Four times a day (QID) | ORAL | Status: DC | PRN
  Administered 2014-08-06 – 2014-08-18 (×7): 25 mg via ORAL
  Filled 2014-08-06 (×6): qty 1

## 2014-08-06 NOTE — Progress Notes (Signed)
Occupational Therapy Session Note  Patient Details  Name: Sherry Lambert MRN: [id number] Date of Birth: [birth year]  Today's Date: 08/06/2014 OT Individual Time: 1000-1100 OT Individual Time Calculation (min): 60 min    Short Term Goals: Week 1:  OT Short Term Goal 1 (Week 1): Pt will perform grooming while standing at sink side with Min A balance during self care task. OT Short Term Goal 2 (Week 1): Pt will perform shower transfer with Mod A in order to decrease assistance with transfers. OT Short Term Goal 3 (Week 1): Pt will perform LB dressing with Mod A in order to increase I with self care. OT Short Term Goal 4 (Week 1): Pt will perform UB dressing with set up A in order to increase I with self care task.  Skilled Therapeutic Interventions/Progress Updates:    Pt engaged in BADL retraining including toilet transfers, toileting, shower transfers, bathing at shower level, and dressing with sit<>stand from w/c.  Pt continues to exhibit ataxic movements in RUE/RLE along with decreased proprioception/sensation.  Pt requires max verbal cues to attend to LUE during functional tasks and when at rest.  Pt continues to exhibit increased impulsivity and requires max verbal cues for safety awareness.  Focus on safety awareness, activity tolerance, sit<>stand, standing balance, and attention to right.  Therapy Documentation Precautions:  Precautions Precautions: Fall Precaution Comments: impulsive, R inattention Restrictions Weight Bearing Restrictions: No  See FIM for current functional status  Therapy/Group: Individual Therapy  Leroy Libman 08/06/2014, 11:01 AM

## 2014-08-06 NOTE — Progress Notes (Signed)
Physical Therapy Session Note  Patient Details  Name: Sherry Lambert MRN: [id number] Date of Birth: 1940/12/20  Today's Date: 08/06/2014 PT Individual Time: 0830-0900 and 1130-1200 and 1500-1610 PT Individual Time Calculation (min): 30 min and 30 min and 70 min  Short Term Goals: Week 1:  PT Short Term Goal 1 (Week 1): Pt will perform supine<>sit with supervision with HOB flat and no rails. PT Short Term Goal 2 (Week 1): Pt will transfer from bed<>w/c with min A and 25% cueing for sequencing, technique. PT Short Term Goal 3 (Week 1): Pt will perform gait x50' with LRAD and mod A of single therapist. PT Short Term Goal 4 (Week 1): Pt will perform w/c mobility x150' with supervision and 25% cueing for technique, obstacle negotiation. PT Short Term Goal 5 (Week 1): Pt will perform static standing x60 seconds without UE support with close supervision to min guard and no overt LOB.  Skilled Therapeutic Interventions/Progress Updates:    Treatment Session 1: Pt received seated in w/c accompanied by son, Dwayne. Pt agreeable to therapy. Session focused on initiation family education and increasing pt independence with bed mobility and functional transfers. Pt performed stand pivot transfer from w/c>bed with mod A (lift/lower assist with sit<>stand), verbal cueing for technique, safety, and manual facilitation of weight shifting. Pt performed blocked practice of multiple supine<>sit transfers to R side for RUE/LE weightbearing to increase proprioception, activation; HOB flat and no rails used. With supine>sit, pt initially required mod A and max multimodal cueing for sequencing with focus on transition from supine>R side lying and advancement of bilat LE's toward EOB. With sit>supine, pt required tactile/verbal cueing for RLE management. Pt exhibited within-session carryover of 75% of cueing provided. Son observed session and was educated on proper positioning in bed/chair, protection/posititioning of RUE,  and R inattention. Son educated on goals and plan of care; son verbalized understanding and was in full agreement. Pt's son very involved in session and exhibited effective return demonstration of cueing during bed mobility. Pt ambulated x15' in pt room with hemi walker and mod A, multimodal cueing for lateral weight shift to L side, RLE placement (due to excessive adduction). Required +2A with squat pivot transfer from bed>w/c due to LOB during transfer. Departed with pt seated in w/c with son present and all needs within reach.  Treatment Session 2: Pt received seated in w/c; agreeable to therapy. Session focused on functional transfer and stair negotiation. Pt performed w/c mobility 2 x150' in controlled environment with L hemi technique and supervision, subtle to min cueing for obstacle negotiation on R side. In gym, pt negotiated 3 stairs forwards with L rail and step-to pattern requiring max A, manual facilitation of lateral weight shift to L, max multimodal cueing for sequencing, awareness of RLE position on step, and safety. Remainder of session focused on stand pivot transfers with mod A and multimodal cueing for setup, awareness of RLE positioning, safe proximity to chair/mat before sitting, and hand placement. Session ended in pt room, where pt was left seated in w/c with R half lap tray on and all needs within reach.  Treatment Session 3: Pt received seated in w/c; agreeable to therapy. Modified pt's w/c from hemi height to ultra hemi height to increase pt independence with self-propuilsion of w/c. Pt performed w/c mobility x150' in controlled environment with L hemi technique and supervision, subtle cueing for obstacle negotiation on R. See below for detailed description of NMR. Transitioned to gait 2 x25' with rolling walker, R hand  orthosis, requiring mod A. During initial trial, pt frequently collided RLE into rolling walker during RLE advancement. Donned1# cuff weight to R ankle for subsequent  gait trial to increase proprioceptive input in RLE; pt did not collide RLE into rolling walker during this trial. Pt then performed x65' in controlled environment with rolling walker, R hand orthosis, mod A, 2# cuff weight at R ankle, and mod A for stability. Pt did not appear to benefit from adding additional pound to RLE. Session ended in pt room, where pt was left seated in w/c with R half lap tray on and all needs within reach,  Therapy Documentation Precautions:  Precautions Precautions: Fall Precaution Comments: impulsive, R inattention Restrictions Weight Bearing Restrictions: No Vital Signs: Therapy Vitals Temp: 97.9 F (36.6 C) Temp Source: Oral Pulse Rate: 72 Resp: 20 BP: (!) 144/62 mmHg Patient Position (if appropriate): Lying Oxygen Therapy SpO2: 100 % O2 Device: Not Delivered Pain: Pain Assessment Pain Score: Asleep Locomotion : Ambulation Ambulation/Gait Assistance: 3: Mod assist Wheelchair Mobility Distance: 150  Other Treatments: Treatments Neuromuscular Facilitation: Right;Lower Extremity;Activity to increase motor control;Activity to increase grading;Activity to increase sustained activation;Other (comment) (Activity to increase selective control of movement)  While semi reclined on mat table, pt performed RLE D2 flexion/extension PNF x20 reps rhythmic initiation. Transitioned to D2 flexion/extension x10 reps resisted. Cueing focused on isolated control of R ankle dorsiflexion with concurrent R knee extension and facilitating R hip abduction during D2 extension to promote more normalized gait pattern. RLE D2 flexion/extension PNF slow reversal hold to facilitate pt initiation of R hip/knee flexion (to increase RLE clearance during gait); tactile cueing at R ankle dorsiflexors to increase selective control of R ankle dorsiflexion with concurrent hip/knee flexion. RLE D2 extension focused on eccentric control of R hip/knee extension.   See FIM for current functional  status  Therapy/Group: Individual Therapy  Valoria Tamburri, Malva Cogan 08/06/2014, 4:28 PM

## 2014-08-06 NOTE — IPOC Note (Deleted)
Overall Plan of Care [facility]) Patient Details Name: Sherry Lambert MRN: [id number] DOB: June 05, 1941  Admitting Diagnosis: L CVA  Hospital Problems: Principal Problem:   Nontraumatic intracerebral hemorrhage Active Problems:   Ataxia S/P CVA   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)     Functional Problem List: Nursing Bladder, Bowel, Endurance, Medication Management, Nutrition, Pain, Safety, Skin Integrity  PT Balance, Endurance, Motor, Perception, Sensory, Safety  OT Balance, Sensory, Vision, Motor, Perception, Safety  SLP    TR         Basic ADL's: OT Eating, Grooming, Bathing, Dressing, Toileting     Advanced  ADL's: OT Simple Meal Preparation, Laundry, Light Housekeeping     Transfers: PT Bed Mobility, Bed to Chair, Car, Furniture, Futures trader, Metallurgist: PT Ambulation, Emergency planning/management officer, Stairs     Additional Impairments: OT Fuctional Use of Upper Extremity  SLP        TR      Anticipated Outcomes Item Anticipated Outcome  Self Feeding Mod I  Swallowing      Basic self-care  Mod I  Toileting  Mod I   Bathroom Transfers Mod I  Bowel/Bladder  continent with bowel and bladder modified independent level  Transfers  Mod I  Locomotion  Supervision to Mod I  Communication     Cognition     Pain  3 or less on scale of 1-10  Safety/Judgment  supervision   Therapy Plan: PT Intensity: Minimum of 1-2 x/day ,45 to 90 minutes PT Frequency: 5 out of 7 days PT Duration Estimated Length of Stay: 14-17 days OT Intensity: Minimum of 1-2 x/day, 45 to 90 minutes OT Frequency: 5 out of 7 days OT Duration/Estimated Length of Stay: 14-17 days         Team Interventions: Nursing Interventions Patient/Family Education, Bladder Management, Bowel Management, Disease Management/Prevention, Pain Management, Discharge Planning, Skin Care/Wound Management, Medication Management, Psychosocial Support  PT interventions  Balance/vestibular training, Ambulation/gait training, Cognitive remediation/compensation, Disease management/prevention, Discharge planning, DME/adaptive equipment instruction, Functional mobility training, Patient/family education, Neuromuscular re-education, UE/LE Coordination activities, Visual/perceptual remediation/compensation, UE/LE Strength taining/ROM, Wheelchair propulsion/positioning, Stair training, Therapeutic Activities, Splinting/orthotics  OT Interventions Balance/vestibular training, Discharge planning, Self Care/advanced ADL retraining, Patient/family education, Functional mobility training, Community reintegration, DME/adaptive equipment instruction, Psychosocial support, Therapeutic Exercise, Visual/perceptual remediation/compensation, UE/LE Coordination activities, Therapeutic Activities  SLP Interventions    TR Interventions    SW/CM Interventions Discharge Planning, Psychosocial Support, Patient/Family Education    Team Discharge Planning: Destination: PT-Home ,OT- Home , SLP-  Projected Follow-up: PT-Home health PT, None (Pending pt progress, no follow up vs. HHPT), OT-  Outpatient OT, SLP-None Projected Equipment Needs: PT-To be determined, OT- To be determined, SLP-None recommended by SLP Equipment Details: PT- , OT-  Patient/family involved in discharge planning: PT- Patient,  OT-Patient, SLP-Patient  MD ELOS: 14-17 days Medical Rehab Prognosis:  Excellent Assessment: The patient has been admitted for CIR therapies with the diagnosis of left CVA. The team will be addressing functional mobility, strength, stamina, balance, safety, adaptive techniques and equipment, self-care, bowel and bladder mgt, patient and caregiver education, NMR, ego support, leisure awareness. Goals have been set at supervision to mod I for self-care, ADL's, mobility and transfers. Pt is motivated.    Meredith Staggers, MD, FAAPMR      See Team Conference Notes for weekly updates to the plan  of care

## 2014-08-06 NOTE — Progress Notes (Signed)
Subjective/Complaints: 73 y.o. female with hypertension, peripheral vascular disease, diabetes mellitus, peripheral arterial disease and COPD experienced acute onset of weakness and numbness involving the right side on 07/28/2014. She had been taking aspirin daily. 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 MRI showed acute subacute left thalamic hemorrhage. Carotid Dopplers no ICA stenosis.Echocardiogram ejection fraction 53% grade 1 diastolic dysfunction. Blood pressure was elevated requiring immediate intervention with labetalol IV. NIH stroke score was 5  No new issues. Slept well   Review of Systems - Negative except as above Objective: Vital Signs: Blood pressure 144/56, pulse 87, temperature 97.9 F (36.6 C), temperature source Oral, resp. rate 20, height 5' (1.524 m), weight 61.78 kg (136 lb 3.2 oz), SpO2 100 %. No results found. Results for orders placed or performed during the hospital encounter of 08/03/14 (from the past 72 hour(s))  CBC     Status: None   Collection Time: 08/03/14  8:05 PM  Result Value Ref Range   WBC 10.3 4.0 - 10.5 K/uL   RBC 4.85 3.87 - 5.11 MIL/uL   Hemoglobin 14.1 12.0 - 15.0 g/dL   HCT 44.2 36.0 - 46.0 %   MCV 91.1 78.0 - 100.0 fL   MCH 29.1 26.0 - 34.0 pg   MCHC 31.9 30.0 - 36.0 g/dL   RDW 13.9 11.5 - 15.5 %   Platelets 282 150 - 400 K/uL  Creatinine, serum     Status: Abnormal   Collection Time: 08/03/14  8:05 PM  Result Value Ref Range   Creatinine, Ser 0.70 0.50 - 1.10 mg/dL   GFR calc non Af Amer 85 (L) >90 mL/min   GFR calc Af Amer >90 >90 mL/min    Comment: (NOTE) The eGFR has been calculated using the CKD EPI equation. This calculation has not been validated in all clinical situations. eGFR's persistently <90 mL/min signify possible Chronic Kidney Disease.   CBC WITH DIFFERENTIAL     Status: None   Collection Time:  08/04/14  4:53 AM  Result Value Ref Range   WBC 9.6 4.0 - 10.5 K/uL   RBC 4.67 3.87 - 5.11 MIL/uL   Hemoglobin 13.0 12.0 - 15.0 g/dL   HCT 41.8 36.0 - 46.0 %   MCV 89.5 78.0 - 100.0 fL   MCH 27.8 26.0 - 34.0 pg   MCHC 31.1 30.0 - 36.0 g/dL   RDW 14.0 11.5 - 15.5 %   Platelets 283 150 - 400 K/uL   Neutrophils Relative % 64 43 - 77 %   Neutro Abs 6.1 1.7 - 7.7 K/uL   Lymphocytes Relative 24 12 - 46 %   Lymphs Abs 2.3 0.7 - 4.0 K/uL   Monocytes Relative 9 3 - 12 %   Monocytes Absolute 0.9 0.1 - 1.0 K/uL   Eosinophils Relative 3 0 - 5 %   Eosinophils Absolute 0.3 0.0 - 0.7 K/uL   Basophils Relative 0 0 - 1 %   Basophils Absolute 0.0 0.0 - 0.1 K/uL  Comprehensive metabolic panel     Status: Abnormal   Collection Time: 08/04/14  4:53 AM  Result Value Ref Range   Sodium 145 137 - 147 mEq/L   Potassium 3.9 3.7 - 5.3 mEq/L   Chloride 106 96 - 112 mEq/L   CO2 28 19 - 32 mEq/L   Glucose, Bld 115 (H) 70 - 99 mg/dL   BUN 12 6 - 23  mg/dL   Creatinine, Ser 0.78 0.50 - 1.10 mg/dL   Calcium 9.1 8.4 - 10.5 mg/dL   Total Protein 6.1 6.0 - 8.3 g/dL   Albumin 3.0 (L) 3.5 - 5.2 g/dL   AST 15 0 - 37 U/L   ALT 15 0 - 35 U/L   Alkaline Phosphatase 68 39 - 117 U/L   Total Bilirubin 0.3 0.3 - 1.2 mg/dL   GFR calc non Af Amer 82 (L) >90 mL/min   GFR calc Af Amer >90 >90 mL/min    Comment: (NOTE) The eGFR has been calculated using the CKD EPI equation. This calculation has not been validated in all clinical situations. eGFR's persistently <90 mL/min signify possible Chronic Kidney Disease.    Anion gap 11 5 - 15  Glucose, capillary     Status: Abnormal   Collection Time: 08/04/14  4:47 PM  Result Value Ref Range   Glucose-Capillary 198 (H) 70 - 99 mg/dL     HEENT: normal Cardio: RRR and no murmur Resp: CTA B/L and unlabored GI: BS positive and NT,ND Extremity:  No Edema Skin:   Intact Neuro: Alert/Oriented, Cranial Nerve II-XII normal, Abnormal Sensory absent LT in Right hand and foot,  Abnormal Motor 4/5 on Right side, 5/5 on left and Abnormal FMC Ataxic/ dec FMC RUE and RLE. Decreased PP/LT and proprioception on right.  Musc/Skel:  Normal Gen NAD   Assessment/Plan: 1. 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 Physiatrist is providing close team supervision and 24 hour management of active medical problems listed below. Physiatrist and rehab team continue to assess barriers to discharge/monitor patient progress toward functional and medical goals.  FIM: FIM - Bathing Bathing Steps Patient Completed: Chest, Right Arm, Left Arm, Abdomen, Front perineal area, Right upper leg, Left upper leg Bathing: 3: Mod-Patient completes 5-7 59f10 parts or 50-74%  FIM - Upper Body Dressing/Undressing Upper body dressing/undressing steps patient completed: Thread/unthread right sleeve of pullover shirt/dresss, Thread/unthread left sleeve of pullover shirt/dress, Put head through opening of pull over shirt/dress Upper body dressing/undressing: 4: Min-Patient completed 75 plus % of tasks FIM - Lower Body Dressing/Undressing Lower body dressing/undressing steps patient completed: Thread/unthread right underwear leg, Thread/unthread left underwear leg, Pull underwear up/down, Thread/unthread left pants leg Lower body dressing/undressing: 2: Max-Patient completed 25-49% of tasks  FIM - Toileting Toileting: 1: Total-Patient completed zero steps, helper did all 3  FIM - TRadio producerDevices: BRecruitment consultantTransfers: 3-To toilet/BSC: Mod A (lift or lower assist)  FIM - BControl and instrumentation engineerDevices: Arm rests Bed/Chair Transfer: 4: Bed > Chair or W/C: Min A (steadying Pt. > 75%), 3: Chair or W/C > Bed: Mod A (lift or lower assist)  FIM - Locomotion: Wheelchair Distance:  200 Locomotion: Wheelchair: 4: Travels 150 ft or more: maneuvers on rugs and over door sillls with minimal assistance (Pt.>75%) FIM - Locomotion: Ambulation Locomotion: Ambulation Assistive Devices: Other (comment) Ambulation/Gait Assistance: 3: Mod assist Locomotion: Ambulation: 1: Travels less than 50 ft with moderate assistance (Pt: 50 - 74%)  Comprehension Comprehension Mode: Auditory Comprehension: 5-Understands complex 90% of the time/Cues < 10% of the time  Expression Expression Mode: Verbal Expression: 5-Expresses complex 90% of the time/cues < 10% of the time  Social Interaction Social Interaction: 7-Interacts appropriately with others - No medications needed.  Problem Solving Problem Solving: 5-Solves complex 90% of the  time/cues < 10% of the time  Memory Memory: 5-Recognizes or recalls 90% of the time/requires cueing < 10% of the time  Medical Problem List and Plan: 1. 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 DVT Prophylaxis/Anticoagulation: Lovenox initiated 08/03/2014 for DVT prophylaxis. Monitor platelet counts of any signs of bleeding 3. Pain Management: Tylenol as needed 4. COPD.Dulera twice a day. 5. Neuropsych: This patient is capable of making decisions on her own behalf. 6. Skin/Wound Care: Routine skin checks 7. Fluids/Electrolytes/Nutrition: Strict I and O's. Labs nl   8. Hypertension. Lisinopril 10 mg daily. Monitor with increased mobility 9. Escherichia coli UTI. Completed Rocephin 08/04/2014. Monitor reccurence LOS (Days) 3 A FACE TO FACE EVALUATION WAS PERFORMED  Anthoney Sheppard T 08/06/2014, 8:59 AM

## 2014-08-07 ENCOUNTER — Inpatient Hospital Stay (HOSPITAL_COMMUNITY): Payer: Medicare HMO | Admitting: *Deleted

## 2014-08-07 ENCOUNTER — Inpatient Hospital Stay (HOSPITAL_COMMUNITY): Payer: Medicare HMO | Admitting: Physical Therapy

## 2014-08-07 DIAGNOSIS — R739 Hyperglycemia, unspecified: Secondary | ICD-10-CM

## 2014-08-07 DIAGNOSIS — I611 Nontraumatic intracerebral hemorrhage in hemisphere, cortical: Secondary | ICD-10-CM

## 2014-08-07 LAB — GLUCOSE, CAPILLARY
GLUCOSE-CAPILLARY: 123 mg/dL — AB (ref 70–99)
Glucose-Capillary: 154 mg/dL — ABNORMAL HIGH (ref 70–99)
Glucose-Capillary: 106 mg/dL — ABNORMAL HIGH (ref 70–99)

## 2014-08-07 MED ORDER — INSULIN ASPART 100 UNIT/ML ~~LOC~~ SOLN
0.0000 [IU] | Freq: Three times a day (TID) | SUBCUTANEOUS | Status: DC
  Administered 2014-08-07: 2 [IU] via SUBCUTANEOUS
  Administered 2014-08-08: 1 [IU] via SUBCUTANEOUS
  Administered 2014-08-08: 2 [IU] via SUBCUTANEOUS
  Administered 2014-08-09: 1 [IU] via SUBCUTANEOUS
  Administered 2014-08-10: 2 [IU] via SUBCUTANEOUS
  Administered 2014-08-11: 1 [IU] via SUBCUTANEOUS
  Administered 2014-08-12: 3 [IU] via SUBCUTANEOUS
  Administered 2014-08-12 – 2014-08-17 (×7): 1 [IU] via SUBCUTANEOUS

## 2014-08-07 NOTE — Plan of Care (Signed)
Problem: RH BOWEL ELIMINATION Goal: RH STG MANAGE BOWEL WITH ASSISTANCE STG Manage Bowel with Mod I Assistance.  Outcome: Progressing Goal: RH STG MANAGE BOWEL W/MEDICATION W/ASSISTANCE STG Manage Bowel with Medication with Mod I Assistance.  Outcome: Progressing  Problem: RH SKIN INTEGRITY Goal: RH STG MAINTAIN SKIN INTEGRITY WITH ASSISTANCE STG Maintain Skin Integrity With [place].  Outcome: Progressing  Problem: RH SAFETY Goal: RH STG ADHERE TO SAFETY PRECAUTIONS W/ASSISTANCE/DEVICE STG Adhere to Safety Precautions With Mod I Assistance/Device.  Outcome: Progressing  Problem: RH PAIN MANAGEMENT Goal: RH STG PAIN MANAGED AT OR BELOW PT'S PAIN GOAL < 4  Outcome: Progressing

## 2014-08-07 NOTE — Progress Notes (Signed)
Physical Therapy Session Note  Patient Details  Name: Sherry Lambert MRN: [id number] Date of Birth: 1941-04-29  Today's Date: 08/07/2014 PT Individual Time: 1330-1420 PT Individual Time Calculation (min): 50 min   Short Term Goals: Week 1:  PT Short Term Goal 1 (Week 1): Pt will perform supine<>sit with supervision with HOB flat and no rails. PT Short Term Goal 2 (Week 1): Pt will transfer from bed<>w/c with min A and 25% cueing for sequencing, technique. PT Short Term Goal 3 (Week 1): Pt will perform gait x50' with LRAD and mod A of single therapist. PT Short Term Goal 4 (Week 1): Pt will perform w/c mobility x150' with supervision and 25% cueing for technique, obstacle negotiation. PT Short Term Goal 5 (Week 1): Pt will perform static standing x60 seconds without UE support with close supervision to min guard and no overt LOB.  Skilled Therapeutic Interventions/Progress Updates:    Gait Training: PT instructs pt in ambulatio x 51' with RW req mod A for balance and repeated verbal cues to weight shift R and L in order to offweight the swing foot. Pt's R UE repeatedly comes off of the RW due to inattention to hand placement and lack of proprioception.  PT instructs pt in ascending/descending 5 stairs with B rails req min A to go up/down, but then req max A to turn around at the bottom of the steps due to impaired proprioception in R leg and not stepping on it appropriately.   W/C Management: PT instructs pt in w/c propulsion with B LEs x 150' req min A and verbal cues to kick R leg out in front of her.  PT instructs pt in w/c propulsion with B UEs with B armrests swung all the way back in order to allow greater grip of wheels & pushrims together req mod A and verbal cues to use eyes to focus hand placement on pushrim due to pt's lack of R UE proprioception.   Pt tries to move very quickly during w/c propulsion, before she has time to correctly motor plan the activity. PT instructs pt  throughout session to slow down, use her eyes to double check hand placement and then proceed with activity. Pt may benefit from added weights to R wrist/ankle for improved proprioceptive feedback. Continue per PT POC.   Therapy Documentation Precautions:  Precautions Precautions: Fall Precaution Comments: impulsive, R inattention Restrictions Weight Bearing Restrictions: No Pain: Pain Assessment Pain Assessment: No/denies pain   See FIM for current functional status  Therapy/Group: Individual Therapy  Zachory Mangual M 08/07/2014, 1:34 PM

## 2014-08-07 NOTE — Progress Notes (Signed)
Occupational Therapy Session Note  Patient Details  Name: Sherry Lambert MRN: [id number] Date of Birth: 09-03-1941  Today's Date: 08/07/2014 OT Individual Time:  -     1115-1248  (93 min)1st session                                            1530-1630  (60 min)  2nd session   Short Term Goals: Week 1:  OT Short Term Goal 1 (Week 1): Pt will perform grooming while standing at sink side with Min A balance during self care task. OT Short Term Goal 2 (Week 1): Pt will perform shower transfer with Mod A in order to decrease assistance with transfers. OT Short Term Goal 3 (Week 1): Pt will perform LB dressing with Mod A in order to increase I with self care. OT Short Term Goal 4 (Week 1): Pt will perform UB dressing with set up A in order to increase I with self care task.  Skilled Therapeutic Interventions/Progress Updates:    Skilled Therapeutic Interventions/Progress Updates:    1 session:  Pt engaged in BADL  dressing with sit<>stand from w/c. Pt exhibited decreased proprioception, light touch as well as  decreased gross and fine motor control of RUE throughout session.Engaged in [place] reaching and pulling up pants using RUE.  Provided  max verbal cues for visual and cognitive attention to RUE during all activities.   required mod A for standing balance andmax verbal  And physical cues for RLE placement when standing. Focus on activity tolerance, increased attention to RUE/RLE, sit<>stand, standing balance, transfers, and safety awareness.  Son present duirng part of session and educated him on providing verbal cues for pt to use RUE with handwriting and eating.  Used last part of session with pt feeding self with RuE to feed self.    2nd session:  Addressed not NMRE to RUE.  Provided handwriting exercises and cup passing on table for pt to practice when not in therapy.  Transferred from wc to mat to practice lateral weight shifting and sit to stand with emphasis on R UE/LE weight bearing.  Pt.  Demonstrated good weight shifting but max cues for weight bearing through Right UE/LE.  Transferred back to wc with increased motor planning and coordination of Right side.  Taken back to room and left with all needs in reach.  .     Therapy Documentation Precautions:  Precautions Precautions: Fall Precaution Comments: impulsive, R inattention Restrictions Weight Bearing Restrictions: No  Pain:   none        See FIM for current functional status  Therapy/Group: Individual Therapy  Lisa Roca 08/07/2014, 8:31 AM

## 2014-08-07 NOTE — Plan of Care (Signed)
Problem: RH BOWEL ELIMINATION Goal: RH STG MANAGE BOWEL WITH ASSISTANCE STG Manage Bowel with Mod I Assistance.  Outcome: Progressing Goal: RH STG MANAGE BOWEL W/MEDICATION W/ASSISTANCE STG Manage Bowel with Medication with Mod I Assistance.  Outcome: Progressing  Problem: RH SKIN INTEGRITY Goal: RH STG MAINTAIN SKIN INTEGRITY WITH ASSISTANCE STG Maintain Skin Integrity With [place].  Outcome: Progressing  Problem: RH SAFETY Goal: RH STG ADHERE TO SAFETY PRECAUTIONS W/ASSISTANCE/DEVICE STG Adhere to Safety Precautions With Mod I Assistance/Device.  Outcome: Progressing  Problem: RH PAIN MANAGEMENT Goal: RH STG PAIN MANAGED AT OR BELOW PT'S PAIN GOAL < 4  Outcome: Progressing

## 2014-08-07 NOTE — Progress Notes (Signed)
KENIYAH GELINAS is a 73 y.o. female 06-12-1941 [id number]  Subjective: No new complaints. No new problems. Slept well. Feeling OK.  Objective: Vital signs in last 24 hours: Temp:  [97.8 F (36.6 C)-98.2 F (36.8 C)] 97.8 F (36.6 C) (12/05 0524) Pulse Rate:  [79-81] 81 (12/05 0524) Resp:  [18] 18 (12/05 0524) BP: (130-148)/(62-79) 148/62 mmHg (12/05 0524) SpO2:  [98 %-99 %] 98 % (12/05 0841) Weight change:  Last BM Date: 08/05/14  Intake/Output from previous day: 12/04 0701 - 12/05 0700 In: 600 [P.O.:600] Out: -   Physical Exam General: No apparent distress    Lungs: Normal effort. Lungs clear to auscultation, no crackles or wheezes. Cardiovascular: Regular rate and rhythm, no edema Neurological: No new neurological deficits   Lab Results: BMET    Component Value Date/Time   NA 145 08/04/2014 0453   K 3.9 08/04/2014 0453   CL 106 08/04/2014 0453   CO2 28 08/04/2014 0453   GLUCOSE 115* 08/04/2014 0453   BUN 12 08/04/2014 0453   CREATININE 0.78 08/04/2014 0453   CALCIUM 9.1 08/04/2014 0453   GFRNONAA 82* 08/04/2014 0453   GFRAA >90 08/04/2014 0453   CBC    Component Value Date/Time   WBC 9.6 08/04/2014 0453   RBC 4.67 08/04/2014 0453   HGB 13.0 08/04/2014 0453   HCT 41.8 08/04/2014 0453   PLT 283 08/04/2014 0453   MCV 89.5 08/04/2014 0453   MCH 27.8 08/04/2014 0453   MCHC 31.1 08/04/2014 0453   RDW 14.0 08/04/2014 0453   LYMPHSABS 2.3 08/04/2014 0453   MONOABS 0.9 08/04/2014 0453   EOSABS 0.3 08/04/2014 0453   BASOSABS 0.0 08/04/2014 0453   CBG's (last 3):   Recent Labs  08/04/14 1647  GLUCAP 198*   Lab Results  Component Value Date   HGBA1C 6.6* 07/30/2014    LFT's Lab Results  Component Value Date   ALT 15 08/04/2014   AST 15 08/04/2014   ALKPHOS 68 08/04/2014   BILITOT 0.3 08/04/2014    Studies/Results: No results found.  Medications:  I have reviewed the patient's current medications. Scheduled Medications: . enoxaparin  (LOVENOX) injection  40 mg Subcutaneous Q24H  . lisinopril  10 mg Oral Daily  . mometasone-formoterol  2 puff Inhalation BID  . pantoprazole  40 mg Oral QHS  . senna-docusate  1 tablet Oral BID   PRN Medications: acetaminophen **OR** acetaminophen, diphenhydrAMINE, ondansetron **OR** ondansetron (ZOFRAN) IV, sorbitol, traZODone  Assessment/Plan: Principal Problem:   Nontraumatic intracerebral hemorrhage Active Problems:   Ataxia S/P CVA   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) 1. 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 DVT Prophylaxis/Anticoagulation: Lovenox initiated 08/03/2014 for DVT prophylaxis. Monitor platelet counts for any signs of bleeding 3. Pain Management: Tylenol as needed 4. COPD.Dulera twice a day. 5. Neuropsych: This patient is capable of making decisions on her own behalf. 6. Skin/Wound Care: Routine skin checks 7. Fluids/Electrolytes/Nutrition: Strict I and O's. Labs nl  8. Hypertension. Lisinopril 10 mg daily. Monitor with increased mobility 9. S/p tx of Escherichia coli UTI. Completed Rocephin 08/04/2014.  10. Hyperglycemia - monitor with SSI - a1c 6.6, no prior dx DM  Length of stay, days: 4  Valerie A. Asa Lente, MD 08/07/2014, 10:30 AM

## 2014-08-08 ENCOUNTER — Inpatient Hospital Stay (HOSPITAL_COMMUNITY): Payer: Medicare HMO | Admitting: *Deleted

## 2014-08-08 LAB — GLUCOSE, CAPILLARY
GLUCOSE-CAPILLARY: 117 mg/dL — AB (ref 70–99)
Glucose-Capillary: 122 mg/dL — ABNORMAL HIGH (ref 70–99)
Glucose-Capillary: 163 mg/dL — ABNORMAL HIGH (ref 70–99)
Glucose-Capillary: 103 mg/dL — ABNORMAL HIGH (ref 70–99)

## 2014-08-08 NOTE — Progress Notes (Signed)
Occupational Therapy Session Note  Patient Details  Name: Sherry Lambert MRN: [id number] Date of Birth: [birth year]  Today's Date: 08/08/2014 OT Individual Time:  -   1445-1530  (45 min)  Short Term Goals: Week 1:  OT Short Term Goal 1 (Week 1): Pt will perform grooming while standing at sink side with Min A balance during self care task. OT Short Term Goal 2 (Week 1): Pt will perform shower transfer with Mod A in order to decrease assistance with transfers. OT Short Term Goal 3 (Week 1): Pt will perform LB dressing with Mod A in order to increase I with self care. OT Short Term Goal 4 (Week 1): Pt will perform UB dressing with set up A in order to increase I with self care task.  Skilled Therapeutic Interventions/Progress Updates:    Pt. Sitting in wc upon OT arrival.  She had completed hand writing exercises, but forgot about the passing the cup.  Reinforced the cup exercise and pt stated she would do after therapy.  Engaged in [place] with heavy weight bearing on the mat.  Attempted hands and knees but motor planning and coorination interfered with achieving.this position.  Perfomed weight on hands, stool and dusting the table.  Transferred back to wc with mod assist.  Taken back to room and left with all needs in place.     Therapy Documentation Precautions:  Precautions Precautions: Fall Precaution Comments: impulsive, R inattention Restrictions Weight Bearing Restrictions: No  Pain:  none   See FIM for current functional status  Therapy/Group: Individual Therapy  Lisa Roca 08/08/2014, 3:36 PM

## 2014-08-08 NOTE — Plan of Care (Signed)
Problem: RH BOWEL ELIMINATION Goal: RH STG MANAGE BOWEL WITH ASSISTANCE STG Manage Bowel with Mod I Assistance.  Outcome: Progressing Goal: RH STG MANAGE BOWEL W/MEDICATION W/ASSISTANCE STG Manage Bowel with Medication with Mod I Assistance.  Outcome: Progressing  Problem: RH SKIN INTEGRITY Goal: RH STG MAINTAIN SKIN INTEGRITY WITH ASSISTANCE STG Maintain Skin Integrity With [place].  Outcome: Progressing  Problem: RH SAFETY Goal: RH STG ADHERE TO SAFETY PRECAUTIONS W/ASSISTANCE/DEVICE STG Adhere to Safety Precautions With Mod I Assistance/Device.  Outcome: Progressing  Problem: RH PAIN MANAGEMENT Goal: RH STG PAIN MANAGED AT OR BELOW PT'S PAIN GOAL < 4  Outcome: Progressing

## 2014-08-08 NOTE — Progress Notes (Signed)
Sherry Lambert is a 73 y.o. female 1940/09/21 [id number]  Subjective: No new complaints."itch" at night but slept well with Benadryl. Feeling OK.  Objective: Vital signs in last 24 hours: Temp:  [98.1 F (36.7 C)-98.3 F (36.8 C)] 98.1 F (36.7 C) (12/06 0555) Pulse Rate:  [79-86] 79 (12/06 0555) Resp:  [17] 17 (12/06 0555) BP: (135)/(65-68) 135/68 mmHg (12/06 0555) SpO2:  [97 %-98 %] 98 % (12/06 0748) Weight change:  Last BM Date: 08/07/14  Intake/Output from previous day: 12/05 0701 - 12/06 0700 In: 1320 [P.O.:1320] Out: -   Physical Exam General: No apparent distress   Son at side Lungs: Normal effort. Lungs clear to auscultation, no crackles or wheezes. Cardiovascular: Regular rate and rhythm, no edema Neurological: No new neurological deficits, working on RUE neglect   Lab Results: BMET    Component Value Date/Time   NA 145 08/04/2014 0453   K 3.9 08/04/2014 0453   CL 106 08/04/2014 0453   CO2 28 08/04/2014 0453   GLUCOSE 115* 08/04/2014 0453   BUN 12 08/04/2014 0453   CREATININE 0.78 08/04/2014 0453   CALCIUM 9.1 08/04/2014 0453   GFRNONAA 82* 08/04/2014 0453   GFRAA >90 08/04/2014 0453   CBC    Component Value Date/Time   WBC 9.6 08/04/2014 0453   RBC 4.67 08/04/2014 0453   HGB 13.0 08/04/2014 0453   HCT 41.8 08/04/2014 0453   PLT 283 08/04/2014 0453   MCV 89.5 08/04/2014 0453   MCH 27.8 08/04/2014 0453   MCHC 31.1 08/04/2014 0453   RDW 14.0 08/04/2014 0453   LYMPHSABS 2.3 08/04/2014 0453   MONOABS 0.9 08/04/2014 0453   EOSABS 0.3 08/04/2014 0453   BASOSABS 0.0 08/04/2014 0453   CBG's (last 3):    Recent Labs  08/07/14 1627 08/07/14 2113 08/08/14 0742  GLUCAP 106* 123* 122*   Lab Results  Component Value Date   HGBA1C 6.6* 07/30/2014    LFT's Lab Results  Component Value Date   ALT 15 08/04/2014   AST 15 08/04/2014   ALKPHOS 68 08/04/2014   BILITOT 0.3 08/04/2014    Studies/Results: No results found.  Medications:  I  have reviewed the patient's current medications. Scheduled Medications: . enoxaparin (LOVENOX) injection  40 mg Subcutaneous Q24H  . insulin aspart  0-9 Units Subcutaneous TID WC  . lisinopril  10 mg Oral Daily  . mometasone-formoterol  2 puff Inhalation BID  . pantoprazole  40 mg Oral QHS  . senna-docusate  1 tablet Oral BID   PRN Medications: acetaminophen **OR** acetaminophen, diphenhydrAMINE, ondansetron **OR** ondansetron (ZOFRAN) IV, sorbitol, traZODone  Assessment/Plan: Principal Problem:   Nontraumatic intracerebral hemorrhage Active Problems:   DM type 2 goal A1C below 7.5   Ataxia S/P CVA   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) 1. 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 DVT Prophylaxis/Anticoagulation: Lovenox initiated 08/03/2014 for DVT prophylaxis. Monitor platelet counts for any signs of bleeding 3. Pain Management: Tylenol as needed 4. COPD.Dulera twice a day. 5. Neuropsych: This patient is capable of making decisions on her own behalf. 6. Skin/Wound Care: Routine skin checks 7. Fluids/Electrolytes/Nutrition: Strict I and O's. Labs nl  8. Hypertension. Lisinopril 10 mg daily. Monitor with increased mobility 9. S/p tx of Escherichia coli UTI. Completed Rocephin 08/04/2014.  10. Hyperglycemia - monitor with SSI - a1c 6.6, no prior dx DM  Length of stay, days: 5  Kimbra Marcelino A. Asa Lente, MD 08/08/2014, 10:02 AM

## 2014-08-09 ENCOUNTER — Inpatient Hospital Stay (HOSPITAL_COMMUNITY): Payer: Medicare HMO

## 2014-08-09 ENCOUNTER — Inpatient Hospital Stay (HOSPITAL_COMMUNITY): Payer: Medicare HMO | Admitting: Rehabilitation

## 2014-08-09 ENCOUNTER — Inpatient Hospital Stay (HOSPITAL_COMMUNITY): Payer: Medicare HMO | Admitting: Occupational Therapy

## 2014-08-09 DIAGNOSIS — I616 Nontraumatic intracerebral hemorrhage, multiple localized: Secondary | ICD-10-CM

## 2014-08-09 DIAGNOSIS — E119 Type 2 diabetes mellitus without complications: Secondary | ICD-10-CM

## 2014-08-09 LAB — GLUCOSE, CAPILLARY
Glucose-Capillary: 119 mg/dL — ABNORMAL HIGH (ref 70–99)
Glucose-Capillary: 98 mg/dL (ref 70–99)
Glucose-Capillary: 101 mg/dL — ABNORMAL HIGH (ref 70–99)

## 2014-08-09 NOTE — Progress Notes (Signed)
Occupational Therapy Session Note  Patient Details  Name: Sherry Lambert MRN: [id number] Date of Birth: November 04, 1940  Today's Date: 08/09/2014 OT Individual Time: 0830-0930 OT Individual Time Calculation (min): 60 min    Short Term Goals: Week 1:  OT Short Term Goal 1 (Week 1): Pt will perform grooming while standing at sink side with Min A balance during self care task. OT Short Term Goal 2 (Week 1): Pt will perform shower transfer with Mod A in order to decrease assistance with transfers. OT Short Term Goal 3 (Week 1): Pt will perform LB dressing with Mod A in order to increase I with self care. OT Short Term Goal 4 (Week 1): Pt will perform UB dressing with set up A in order to increase I with self care task.  Skilled Therapeutic Interventions/Progress Updates:    Engaged in ADL retraining with focus on transfers, sit <> stand, standing balance, and increased attention to RUE and Rt environment during self-care tasks of bathing and dressing.  Stand pivot transfer w/c <> shower seat in walk-in shower with mod assist and heavy use of grab bar.  Pt attempts to incorporate RUE into tasks but continuously drops wash cloth or will lose grasp on grab bar with no awareness, requiring hand over hand assist to maintain grasp on grab bar when standing for safety.  Dressing completed at sit > stand level at sink with education on hand placement to push up from w/c and not pull up on sink.  Educated on hemi-dressing technique for UB and LB dressing.  Pt donned Rt shoe, but required assist to fasten both shoes.  Educated on elastic shoelaces and shoe buttons with pt and therapist preference to shoe button, demonstrating technique with pt return demonstrating (however only one shoe button -will need to locate another for 2nd shoe).  Oral hygiene completed in standing with hand over hand to maintain WB through RUE on sink when not attempting to utilize as stabilizer when applying toothpaste.    Therapy  Documentation Precautions:  Precautions Precautions: Fall Precaution Comments: impulsive, R inattention Restrictions Weight Bearing Restrictions: No Pain: Pain Assessment Pain Assessment: No/denies pain Pain Score: 0-No pain  See FIM for current functional status  Therapy/Group: Individual Therapy  Simonne Come 08/09/2014, 10:42 AM

## 2014-08-09 NOTE — Progress Notes (Signed)
Physical Therapy Session Note  Patient Details  Name: Sherry Lambert MRN: [id number] Date of Birth: 01/13/1941  Today's Date: 08/09/2014 PT Individual Time: 1000-1100 PT Individual Time Calculation (min): 60 min   Short Term Goals: Week 1:  PT Short Term Goal 1 (Week 1): Pt will perform supine<>sit with supervision with HOB flat and no rails. PT Short Term Goal 2 (Week 1): Pt will transfer from bed<>w/c with min A and 25% cueing for sequencing, technique. PT Short Term Goal 3 (Week 1): Pt will perform gait x50' with LRAD and mod A of single therapist. PT Short Term Goal 4 (Week 1): Pt will perform w/c mobility x150' with supervision and 25% cueing for technique, obstacle negotiation. PT Short Term Goal 5 (Week 1): Pt will perform static standing x60 seconds without UE support with close supervision to min guard and no overt LOB.  Skilled Therapeutic Interventions/Progress Updates:    Pt received seated in w/c, agreeable to participate in therapy. Session focused on increasing WB and attention to R side. Propelled w/c 150' to rehab gym w/ MinA for maintaining straight path w/ LUE/LLE.   Pt overall Min-ModA for stand pivot transfers and sit<>stand to different surfaces including ultra hemi-height wheelchair, Nust[place], chair with arm rests, mod cueing to reach back for arm rests before sitting.  Pt completed 10' on Nustep L1 w/ BUE/LE for increased attention to R side, reciprocal proprioceptive feedback. Pt tolerated exercise well.  Gait training in hallway 4x50' w/ RW, R hand orthosis, 1# weight on R ankle for first 3 bouts. Pt initially [place] for RW management, placement of R foot during swing. Progressed to MinA with assist primarily for facilitation of R glute during R stance phase to prevent R knee hyperextension, and manual facilitation for upright posture, min verbal cueing to manage RW (pt tends to push RW too far in front and for slower pace. Noted pt with difficulty maintaining gait pattern  in busier environment or if attempting to multi task with holding conversation while walking.  Sit<>stands x3 w/ RLE biased to increase WBing through R side.   Session ended in pt's room where pt was left seated in w/c w/ all needs within reach.  Therapy Documentation Precautions:  Precautions Precautions: Fall Precaution Comments: impulsive, R inattention Restrictions Weight Bearing Restrictions: No Pain:  No/denies pain  See FIM for current functional status  Therapy/Group: Individual Therapy  Rada Hay  Rada Hay, PT, DPT 08/09/2014, 7:45 AM

## 2014-08-09 NOTE — Progress Notes (Signed)
Subjective/Complaints: 73 y.o. female with hypertension, peripheral vascular disease, diabetes mellitus, peripheral arterial disease and COPD experienced acute onset of weakness and numbness involving the right side on 07/28/2014. She had been taking aspirin daily. 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 MRI showed acute subacute left thalamic hemorrhage. Carotid Dopplers no ICA stenosis.Echocardiogram ejection fraction 35% grade 1 diastolic dysfunction. Blood pressure was elevated requiring immediate intervention with labetalol IV. NIH stroke score was 5  Asking what to do to help Right arm Uses Left hand to feed (but normally R handed)  Review of Systems - Negative except as above Objective: Vital Signs: Blood pressure 150/57, pulse 70, temperature 98.3 F (36.8 C), temperature source Oral, resp. rate 18, height 5' (1.524 m), weight 61.78 kg (136 lb 3.2 oz), SpO2 96 %. No results found. Results for orders placed or performed during the hospital encounter of 08/03/14 (from the past 72 hour(s))  Glucose, capillary     Status: Abnormal   Collection Time: 08/07/14 11:18 AM  Result Value Ref Range   Glucose-Capillary 154 (H) 70 - 99 mg/dL   Comment 1 Notify RN   Glucose, capillary     Status: Abnormal   Collection Time: 08/07/14  4:27 PM  Result Value Ref Range   Glucose-Capillary 106 (H) 70 - 99 mg/dL   Comment 1 Notify RN   Glucose, capillary     Status: Abnormal   Collection Time: 08/07/14  9:13 PM  Result Value Ref Range   Glucose-Capillary 123 (H) 70 - 99 mg/dL  Glucose, capillary     Status: Abnormal   Collection Time: 08/08/14  7:42 AM  Result Value Ref Range   Glucose-Capillary 122 (H) 70 - 99 mg/dL  Glucose, capillary     Status: Abnormal   Collection Time: 08/08/14 11:43 AM  Result Value Ref Range   Glucose-Capillary 117 (H) 70 - 99 mg/dL  Glucose, capillary      Status: Abnormal   Collection Time: 08/08/14  4:36 PM  Result Value Ref Range   Glucose-Capillary 163 (H) 70 - 99 mg/dL  Glucose, capillary     Status: Abnormal   Collection Time: 08/08/14  9:42 PM  Result Value Ref Range   Glucose-Capillary 103 (H) 70 - 99 mg/dL  Glucose, capillary     Status: Abnormal   Collection Time: 08/09/14  6:41 AM  Result Value Ref Range   Glucose-Capillary 119 (H) 70 - 99 mg/dL     HEENT: normal Cardio: RRR and no murmur Resp: CTA B/L and unlabored GI: BS positive and NT,ND Extremity:  No Edema Skin:   Intact Neuro: Alert/Oriented, Cranial Nerve II-XII normal, Abnormal Sensory absent LT in Right hand and foot, Abnormal Motor 4/5 on Right side, 5/5 on left and Abnormal FMC Ataxic/ dec FMC RUE and RLE.Absent PP/LT and proprioception on right.  Musc/Skel:  Normal Gen NAD   Assessment/Plan: 1. 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 Physiatrist is providing close team supervision and 24 hour management of active medical problems listed below. Physiatrist and rehab team continue to assess barriers to discharge/monitor patient progress toward functional and medical goals.  FIM: FIM - Bathing Bathing Steps Patient Completed: Chest, Right Arm, Left Arm, Abdomen, Front perineal area, Right upper leg, Left upper leg Bathing: 3: Mod-Patient completes 5-7 26f  10 parts or 50-74%  FIM - Upper Body Dressing/Undressing Upper body dressing/undressing steps patient completed: Thread/unthread left sleeve of pullover shirt/dress, Put head through opening of pull over shirt/dress, Pull shirt over trunk Upper body dressing/undressing: 4: Min-Patient completed 75 plus % of tasks FIM - Lower Body Dressing/Undressing Lower body dressing/undressing steps patient completed: Thread/unthread right underwear leg, Thread/unthread left  underwear leg, Thread/unthread right pants leg, Thread/unthread left pants leg Lower body dressing/undressing: 3: Mod-Patient completed 50-74% of tasks  FIM - Toileting Toileting steps completed by patient: Performs perineal hygiene, Adjust clothing prior to toileting Toileting Assistive Devices: Grab bar or rail for support Toileting: 3: Mod-Patient completed 2 of 3 steps  FIM - Radio producer Devices: Product manager Transfers: 3-To toilet/BSC: Mod A (lift or lower assist), 3-From toilet/BSC: Mod A (lift or lower assist)  FIM - Control and instrumentation engineer Devices: Orthosis, Arm rests Bed/Chair Transfer: 3: Bed > Chair or W/C: Mod A (lift or lower assist), 4: Chair or W/C > Bed: Min A (steadying Pt. > 75%)  FIM - Locomotion: Wheelchair Distance: 150 (B feet) Locomotion: Wheelchair: 4: Travels 150 ft or more: maneuvers on rugs and over door sillls with minimal assistance (Pt.>75%) FIM - Locomotion: Ambulation Locomotion: Ambulation Assistive Devices: Administrator Ambulation/Gait Assistance: 3: Mod assist Locomotion: Ambulation: 1: Travels less than 50 ft with moderate assistance (Pt: 50 - 74%)  Comprehension Comprehension Mode: Auditory Comprehension: 5-Understands complex 90% of the time/Cues < 10% of the time  Expression Expression Mode: Verbal Expression: 5-Expresses complex 90% of the time/cues < 10% of the time  Social Interaction Social Interaction: 7-Interacts appropriately with others - No medications needed.  Problem Solving Problem Solving: 5-Solves basic 90% of the time/requires cueing < 10% of the time  Memory Memory: 5-Recognizes or recalls 90% of the time/requires cueing < 10% of the time  Medical Problem List and Plan: 1. 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 DVT Prophylaxis/Anticoagulation: Lovenox initiated 08/03/2014 for DVT prophylaxis. Monitor platelet  counts of any signs of bleeding 3. Pain Management: Tylenol as needed 4. COPD.Dulera twice a day. 5. Neuropsych: This patient is capable of making decisions on her own behalf. 6. Skin/Wound Care: Routine skin checks 7. Fluids/Electrolytes/Nutrition: Strict I and O's. Labs nl   8. Hypertension. Lisinopril 10 mg daily. Monitor with increased mobility  LOS (Days) 6 A FACE TO FACE EVALUATION WAS PERFORMED  KIRSTEINS,ANDREW E 08/09/2014, 7:34 AM

## 2014-08-09 NOTE — Progress Notes (Signed)
Complained of itching to right flank and right buttock at HS. No rash observed. PRN benadryl given at 2040, with relief. Sherry Lambert A

## 2014-08-09 NOTE — Progress Notes (Signed)
Physical Therapy Session Note  Patient Details  Name: Sherry Lambert MRN: [id number] Date of Birth: [birth year]  Today's Date: 08/09/2014 PT Individual Time: 1330-1430 PT Individual Time Calculation (min): 60 min   Short Term Goals: Week 1:  PT Short Term Goal 1 (Week 1): Pt will perform supine<>sit with supervision with HOB flat and no rails. PT Short Term Goal 2 (Week 1): Pt will transfer from bed<>w/c with min A and 25% cueing for sequencing, technique. PT Short Term Goal 3 (Week 1): Pt will perform gait x50' with LRAD and mod A of single therapist. PT Short Term Goal 4 (Week 1): Pt will perform w/c mobility x150' with supervision and 25% cueing for technique, obstacle negotiation. PT Short Term Goal 5 (Week 1): Pt will perform static standing x60 seconds without UE support with close supervision to min guard and no overt LOB.  Skilled Therapeutic Interventions/Progress Updates:   Pt received sitting in w/c in room, agreeable to therapy session.  Pt self propelled to/from therapy gym x 150'x 2 reps at min A level.  Pt requires mod cues for sequencing and technique for steering with LLE while using L hand to propel.  Also requires min A at times, esp when making turns to avoid hitting obstacles.  Had pt ambulate from hallway into ADL apt in order to better simulate home environment.  Requires min to mod A at times due to continuous catching of RLE on carpet.  Max verbal cues for increased step length and increased hip/knee flexion in RLE for better clearance.  Also educated that she would need to work on looking at where R foot is prior to moving RW, as she tends to turn with feet on outside of RW.  Performed bed mobility at S level in ADL apt.  She states that her bed is similar in height to that of the bed in ADL apt.  Also performed furniture transfer to couch in ADL apt.  Requires constant cues for hand placement and safety when sitting and standing.  Requires min A for standing throughout  session for safety.  Progressed to ortho gym in order to perform car transfer.  Pt states that she will be getting into a Jeep.  Simulated height as best pt could recall and was able to perform at min/mod A with cues for safety and hand placement.  Then assisted pt to therapy gym in order to work on gait and technique with RW while negotiating around tight spaces and over obstacles.  Performed 25' x 2 reps while ambulating around cones, over poles, onto and over 2" step and then over blue wedge.  Requires continuous cues for first negotiation around cones, however did better on the way back for increasing attention to R foot and placement inside of RW.  Also requires mod A cues for negotiating over obstacles, due to decreased memory of ensuring placement of RLE.  Then performed stairs to simulate home entry.  Performed 2 reps of 2, 6" STE with L handrail at mod A level.  Had pt lead with RLE to better use vision for placement of RLE and due to pushing.  Pt again requires max A cues for safety, impulsivity, and sequencing.  Ended session with w/c propulsion back to room as stated above and blocked practice of sit<>stand x 6 reps with RW at min A level for increased carryover for hand placement and safety of RUE to/from splint.  Pt continued to require at least min cues for RUE safety  during practice.  Pt left in w/c with all needs in reach.   Therapy Documentation Precautions:  Precautions Precautions: Fall Precaution Comments: impulsive, R inattention Restrictions Weight Bearing Restrictions: No  Pain: Pt with no c/o pain during session.  Locomotion : Ambulation Ambulation/Gait Assistance: 3: Mod assist;4: Min Financial controller Distance: 150   See FIM for current functional status  Therapy/Group: Individual Therapy  Denice Bors 08/09/2014, 4:05 PM

## 2014-08-09 NOTE — Plan of Care (Signed)
Problem: RH BOWEL ELIMINATION Goal: RH STG MANAGE BOWEL WITH ASSISTANCE STG Manage Bowel with Mod I Assistance.  Outcome: Progressing  Problem: RH SKIN INTEGRITY Goal: RH STG MAINTAIN SKIN INTEGRITY WITH ASSISTANCE STG Maintain Skin Integrity With [place].  Outcome: Progressing  Problem: RH SAFETY Goal: RH STG ADHERE TO SAFETY PRECAUTIONS W/ASSISTANCE/DEVICE STG Adhere to Safety Precautions With Mod I Assistance/Device.  Outcome: Progressing  Problem: RH PAIN MANAGEMENT Goal: RH STG PAIN MANAGED AT OR BELOW PT'S PAIN GOAL < 4  Outcome: Progressing

## 2014-08-10 ENCOUNTER — Encounter (HOSPITAL_COMMUNITY): Payer: Medicare HMO | Admitting: Occupational Therapy

## 2014-08-10 ENCOUNTER — Inpatient Hospital Stay (HOSPITAL_COMMUNITY): Payer: Medicare HMO | Admitting: Occupational Therapy

## 2014-08-10 ENCOUNTER — Inpatient Hospital Stay (HOSPITAL_COMMUNITY): Payer: Medicare HMO | Admitting: *Deleted

## 2014-08-10 DIAGNOSIS — I619 Nontraumatic intracerebral hemorrhage, unspecified: Secondary | ICD-10-CM

## 2014-08-10 LAB — CREATININE, SERUM
Creatinine, Ser: 0.7 mg/dL (ref 0.50–1.10)
GFR, EST NON AFRICAN AMERICAN: 85 mL/min — AB (ref >90)

## 2014-08-10 LAB — GLUCOSE, CAPILLARY
GLUCOSE-CAPILLARY: 109 mg/dL — AB (ref 70–99)
GLUCOSE-CAPILLARY: 121 mg/dL — AB (ref 70–99)
Glucose-Capillary: 137 mg/dL — ABNORMAL HIGH (ref 70–99)
Glucose-Capillary: 97 mg/dL (ref 70–99)
Glucose-Capillary: 151 mg/dL — ABNORMAL HIGH (ref 70–99)

## 2014-08-10 NOTE — Progress Notes (Signed)
Occupational Therapy Session Note  Patient Details  Name: Sherry Lambert MRN: [id number] Date of Birth: October 18, 1940  Today's Date: 08/10/2014 OT Individual Time: 4287-6811 and 1305-1410 OT Individual Time Calculation (min): 60 min and 65 min   Short Term Goals: Week 1:  OT Short Term Goal 1 (Week 1): Pt will perform grooming while standing at sink side with Min A balance during self care task. OT Short Term Goal 2 (Week 1): Pt will perform shower transfer with Mod A in order to decrease assistance with transfers. OT Short Term Goal 3 (Week 1): Pt will perform LB dressing with Mod A in order to increase I with self care. OT Short Term Goal 4 (Week 1): Pt will perform UB dressing with set up A in order to increase I with self care task.  Skilled Therapeutic Interventions/Progress Updates:    1) Engaged in ADL retraining with focus on sit <> stand, standing balance, and adaptive dressing techniques.  Pt declined shower, requesting to only wash perineal area and complete grooming tasks this session.  Min/steady assist in standing with bathing and when pulling pants over hips.  Pt recalled hemi-dressing technique with pants and underwear, however required cues when donning shirt.  Educated pt on alternative methods for donning bra with pt most successful with fastening hook in lap and putting bra on over head like shirt.  Provided pt with theraputty and HEP handout to complete with RUE to increase [facility] and strengthening.  Pt return demonstrated each exercise.  Provided pt with 5 beads to place in putty and retrieve utilizing Rt hand.  2) Engaged in therapeutic activity with focus on midline standing and functional use of RUE. Focus on functional use of RUE with education on visually attending to Rt hand during movement, especially with more fine motor type movements as pt tends to demonstrate decreased control and tends to bump into things or get hand caught on items when not attending.  Utilized pegs,  magnets, and Connect 4 in standing with progressing task to reaching to Rt and outside BOS to promote weight shift to Rt as pt tends to keep majority of weight through LLE.  Pt with improved midline standing post reaching activity.  Pt with questions regarding stroke, educated on risks and recovery process.  Pt passed off to PT.  Therapy Documentation Precautions:  Precautions Precautions: Fall Precaution Comments: impulsive, R inattention Restrictions Weight Bearing Restrictions: No General:   Vital Signs: Therapy Vitals BP: 118/70 mmHg Oxygen Therapy O2 Device: Not Delivered Pain:  Pt with no c/o pain  See FIM for current functional status  Therapy/Group: Individual Therapy  Simonne Come 08/10/2014, 9:49 AM

## 2014-08-10 NOTE — Plan of Care (Signed)
Problem: RH BOWEL ELIMINATION Goal: RH STG MANAGE BOWEL WITH ASSISTANCE STG Manage Bowel with Mod I Assistance.  Outcome: Progressing

## 2014-08-10 NOTE — Plan of Care (Signed)
Problem: RH SKIN INTEGRITY Goal: RH STG MAINTAIN SKIN INTEGRITY WITH ASSISTANCE STG Maintain Skin Integrity With [place].  Outcome: Progressing  Problem: RH SAFETY Goal: RH STG ADHERE TO SAFETY PRECAUTIONS W/ASSISTANCE/DEVICE STG Adhere to Safety Precautions With Mod I Assistance/Device.  Outcome: Completed/Met Date Met:  08/10/14  Problem: RH PAIN MANAGEMENT Goal: RH STG PAIN MANAGED AT OR BELOW PT'S PAIN GOAL < 4  Outcome: Completed/Met Date Met:  08/10/14

## 2014-08-10 NOTE — Plan of Care (Signed)
Problem: RH BOWEL ELIMINATION Goal: RH STG MANAGE BOWEL W/MEDICATION W/ASSISTANCE STG Manage Bowel with Medication with Mod I Assistance.  Outcome: Progressing

## 2014-08-10 NOTE — Plan of Care (Signed)
Problem: RH SKIN INTEGRITY Goal: RH STG MAINTAIN SKIN INTEGRITY WITH ASSISTANCE STG Maintain Skin Integrity With [place].  Outcome: Progressing

## 2014-08-10 NOTE — Plan of Care (Signed)
Problem: RH SAFETY Goal: RH STG ADHERE TO SAFETY PRECAUTIONS W/ASSISTANCE/DEVICE STG Adhere to Safety Precautions With Mod I Assistance/Device.  Outcome: Progressing

## 2014-08-10 NOTE — Plan of Care (Signed)
Problem: RH PAIN MANAGEMENT Goal: RH STG PAIN MANAGED AT OR BELOW PT'S PAIN GOAL <4  Outcome: Progressing     

## 2014-08-10 NOTE — Progress Notes (Signed)
Physical Therapy Session Note  Patient Details  Name: Sherry Lambert MRN: [id number] Date of Birth: [birth year]  Today's Date: 08/10/2014 PT Individual Time: 14:10-15:05 (48min)   Short Term Goals: Week 1:  PT Short Term Goal 1 (Week 1): Pt will perform supine<>sit with supervision with HOB flat and no rails. PT Short Term Goal 2 (Week 1): Pt will transfer from bed<>w/c with min A and 25% cueing for sequencing, technique. PT Short Term Goal 3 (Week 1): Pt will perform gait x50' with LRAD and mod A of single therapist. PT Short Term Goal 4 (Week 1): Pt will perform w/c mobility x150' with supervision and 25% cueing for technique, obstacle negotiation. PT Short Term Goal 5 (Week 1): Pt will perform static standing x60 seconds without UE support with close supervision to min guard and no overt LOB.  Skilled Therapeutic Interventions/Progress Updates:  Tx focused on functional mobility training, stairs, gait with RW, and NMR via forced use, manual facilitation, and verbal cues. Hand-off from OT. Pt limited this tx by decreased attention to R-side during mobility. Pt encouraged to make R-sided attention first step in all mobility.   Instructed pt in WC propulsion x50' with min A for safety due to decreased attention to R-side. NMR via coordination training by propulsion with bil LEs only.   Instructed pt in serial squat-pivot transfers with Min steadying assist and cues for sequence/technique.   Stairs with max A x5 with bil rails. Cues for R hand placement, sequence, and safety.   NMR  -Pt instructed in seated UE motor control task for colored peg placement during rest breaks.  - Standing with 1/0 UE support of thereapist:  - static standing x50min with min-guard A  - lateral weight shifts with Min A  - mini-squats with Min A - step-taps to 2" with Max A due to uncontrolled posterior LOB  Gait training with:  - RW and hand orthosis 1x85' and min/mod A for steadying especially when pt R foot  out of place. Pt allowed to look down for sense of placement and step length. Pt responded well to cues for equal step length, improved as walk progressed. Pt almost able to achieve continuous gait pattern.  - No device with R HHA with Mod A and mirror for visual feedback x50'. Pt does nice job of upward gaze, but difficulty with R foot placement.   Pt left up in recliner with all needs in reach.      Therapy Documentation Precautions:  Precautions Precautions: Fall Precaution Comments: impulsive, R inattention Restrictions Weight Bearing Restrictions: No General:     Pain: none     See FIM for current functional status  Therapy/Group: Individual Therapy  Kennieth Rad, PT, DPT  08/10/2014, 2:37 PM

## 2014-08-10 NOTE — Progress Notes (Signed)
Subjective/Complaints: 73 y.o. female with hypertension, peripheral vascular disease, diabetes mellitus, peripheral arterial disease and COPD experienced acute onset of weakness and numbness involving the right side on 07/28/2014. She had been taking aspirin daily. 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 MRI showed acute subacute left thalamic hemorrhage. Carotid Dopplers no ICA stenosis.Echocardiogram ejection fraction 22% grade 1 diastolic dysfunction. Blood pressure was elevated requiring immediate intervention with labetalol IV. NIH stroke score was 5  Felt some pain in RUE last noc, some tingling  Review of Systems - Negative except as above Objective: Vital Signs: Blood pressure 108/45, pulse 76, temperature 97.7 F (36.5 C), temperature source Oral, resp. rate 19, height 5' (1.524 m), weight 61.78 kg (136 lb 3.2 oz), SpO2 95 %. No results found. Results for orders placed or performed during the hospital encounter of 08/03/14 (from the past 72 hour(s))  Glucose, capillary     Status: Abnormal   Collection Time: 08/07/14 11:18 AM  Result Value Ref Range   Glucose-Capillary 154 (H) 70 - 99 mg/dL   Comment 1 Notify RN   Glucose, capillary     Status: Abnormal   Collection Time: 08/07/14  4:27 PM  Result Value Ref Range   Glucose-Capillary 106 (H) 70 - 99 mg/dL   Comment 1 Notify RN   Glucose, capillary     Status: Abnormal   Collection Time: 08/07/14  9:13 PM  Result Value Ref Range   Glucose-Capillary 123 (H) 70 - 99 mg/dL  Glucose, capillary     Status: Abnormal   Collection Time: 08/08/14  7:42 AM  Result Value Ref Range   Glucose-Capillary 122 (H) 70 - 99 mg/dL  Glucose, capillary     Status: Abnormal   Collection Time: 08/08/14 11:43 AM  Result Value Ref Range   Glucose-Capillary 117 (H) 70 - 99 mg/dL  Glucose, capillary     Status: Abnormal   Collection  Time: 08/08/14  4:36 PM  Result Value Ref Range   Glucose-Capillary 163 (H) 70 - 99 mg/dL  Glucose, capillary     Status: Abnormal   Collection Time: 08/08/14  9:42 PM  Result Value Ref Range   Glucose-Capillary 103 (H) 70 - 99 mg/dL  Glucose, capillary     Status: Abnormal   Collection Time: 08/09/14  6:41 AM  Result Value Ref Range   Glucose-Capillary 119 (H) 70 - 99 mg/dL  Glucose, capillary     Status: None   Collection Time: 08/09/14 11:57 AM  Result Value Ref Range   Glucose-Capillary 98 70 - 99 mg/dL   Comment 1 Notify RN   Glucose, capillary     Status: Abnormal   Collection Time: 08/09/14  9:14 PM  Result Value Ref Range   Glucose-Capillary 101 (H) 70 - 99 mg/dL  Creatinine, serum     Status: Abnormal   Collection Time: 08/10/14  3:57 AM  Result Value Ref Range   Creatinine, Ser 0.70 0.50 - 1.10 mg/dL   GFR calc non Af Amer 85 (L) >90 mL/min   GFR calc Af Amer >90 >90 mL/min    Comment: (NOTE) The eGFR has been calculated using the CKD EPI equation. This calculation has not been validated in all clinical situations. eGFR's persistently <90 mL/min signify possible Chronic Kidney Disease.   Glucose, capillary     Status: None   Collection Time: 08/10/14  6:35 AM  Result Value Ref  Range   Glucose-Capillary 97 70 - 99 mg/dL     HEENT: normal Cardio: RRR and no murmur Resp: CTA B/L and unlabored GI: BS positive and NT,ND Extremity:  No Edema Skin:   Intact Neuro: Alert/Oriented, Cranial Nerve II-XII normal, Abnormal Sensory absent LT in Right hand and foot, Abnormal Motor 4/5 on Right side, 5/5 on left and Abnormal FMC Ataxic/ dec FMC RUE and RLE.Absent PP/LT and proprioception on right.  Musc/Skel:  Normal Gen NAD   Assessment/Plan: 1. 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 Physiatrist is providing close team  supervision and 24 hour management of active medical problems listed below. Physiatrist and rehab team continue to assess barriers to discharge/monitor patient progress toward functional and medical goals.  FIM: FIM - Bathing Bathing Steps Patient Completed: Chest, Right Arm, Left Arm, Abdomen, Front perineal area, Right upper leg, Left upper leg, Buttocks Bathing: 4: Min-Patient completes 8-9 [id number] parts or 75+ percent  FIM - Upper Body Dressing/Undressing Upper body dressing/undressing steps patient completed: Thread/unthread right bra strap, Thread/unthread left bra strap, Thread/unthread left sleeve of pullover shirt/dress, Thread/unthread right sleeve of pullover shirt/dresss, Put head through opening of pull over shirt/dress, Pull shirt over trunk Upper body dressing/undressing: 4: Min-Patient completed 75 plus % of tasks FIM - Lower Body Dressing/Undressing Lower body dressing/undressing steps patient completed: Thread/unthread right underwear leg, Thread/unthread left underwear leg, Pull underwear up/down, Thread/unthread left pants leg, Pull pants up/down, Don/Doff left shoe Lower body dressing/undressing: 3: Mod-Patient completed 50-74% of tasks  FIM - Toileting Toileting steps completed by patient: Adjust clothing prior to toileting, Adjust clothing after toileting Toileting Assistive Devices: Grab bar or rail for support Toileting: 3: Mod-Patient completed 2 of 3 steps  FIM - TRadio producerDevices: GProduct managerTransfers: 3-To toilet/BSC: Mod A (lift or lower assist), 3-From toilet/BSC: Mod A (lift or lower assist)  FIM - BControl and instrumentation engineerDevices: Orthosis, WAdult nurseTransfer: 3: Bed > Chair or W/C: Mod A (lift or lower assist), 3: Chair or W/C > Bed: Mod A (lift or lower assist)  FIM - Locomotion: Wheelchair Distance: 150 Locomotion: Wheelchair: 4: Travels 150 ft or more: maneuvers on rugs and over door  sillls with minimal assistance (Pt.>75%) FIM - Locomotion: Ambulation Locomotion: Ambulation Assistive Devices: WAdministratorAmbulation/Gait Assistance: 3: Mod assist, 4: Min assist Locomotion: Ambulation: 1: Travels less than 50 ft with moderate assistance (Pt: 50 - 74%)  Comprehension Comprehension Mode: Auditory Comprehension: 5-Understands complex 90% of the time/Cues < 10% of the time  Expression Expression Mode: Verbal Expression: 5-Expresses complex 90% of the time/cues < 10% of the time  Social Interaction Social Interaction: 7-Interacts appropriately with others - No medications needed.  Problem Solving Problem Solving: 5-Solves basic 90% of the time/requires cueing < 10% of the time  Memory Memory: 3-Recognizes or recalls 50 - 74% of the time/requires cueing 25 - 49% of the time  Medical Problem List and Plan: 1. 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 DVT Prophylaxis/Anticoagulation: Lovenox initiated 08/03/2014 for DVT prophylaxis. Monitor platelet counts of any signs of bleeding 3. Pain Management: Tylenol as needed, sensory dysesthesia, monitor may need gabapentin if it worsens 4. COPD.Dulera twice a day. 5. Neuropsych: This patient is capable of making decisions on her own behalf. 6. Skin/Wound Care: Routine skin checks 7. Fluids/Electrolytes/Nutrition: Strict I  and O's. Labs nl   8. Hypertension. Lisinopril 10 mg daily. Monitor with increased mobility  LOS (Days) 7 A FACE TO FACE EVALUATION WAS PERFORMED  Sherry Lambert E 08/10/2014, 8:23 AM

## 2014-08-11 ENCOUNTER — Inpatient Hospital Stay (HOSPITAL_COMMUNITY): Payer: Medicare HMO | Admitting: Physical Therapy

## 2014-08-11 ENCOUNTER — Inpatient Hospital Stay (HOSPITAL_COMMUNITY): Payer: Medicare HMO | Admitting: Occupational Therapy

## 2014-08-11 LAB — GLUCOSE, CAPILLARY
Glucose-Capillary: 111 mg/dL — ABNORMAL HIGH (ref 70–99)
Glucose-Capillary: 138 mg/dL — ABNORMAL HIGH (ref 70–99)
Glucose-Capillary: 107 mg/dL — ABNORMAL HIGH (ref 70–99)
Glucose-Capillary: 122 mg/dL — ABNORMAL HIGH (ref 70–99)

## 2014-08-11 MED ORDER — HYDROCORTISONE 1 % EX CREA
TOPICAL_CREAM | Freq: Two times a day (BID) | CUTANEOUS | Status: DC
  Administered 2014-08-11 – 2014-08-14 (×8): via TOPICAL
  Administered 2014-08-14: 1 via TOPICAL
  Administered 2014-08-15: 08:00:00 via TOPICAL
  Administered 2014-08-15: 1 via TOPICAL
  Administered 2014-08-18 – 2014-08-20 (×3): via TOPICAL
  Filled 2014-08-11: qty 28

## 2014-08-11 NOTE — Progress Notes (Signed)
Subjective/Complaints: 73 y.o. female with hypertension, peripheral vascular disease, diabetes mellitus, peripheral arterial disease and COPD experienced acute onset of weakness and numbness involving the right side on 07/28/2014. She had been taking aspirin daily. 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 MRI showed acute subacute left thalamic hemorrhage. Carotid Dopplers no ICA stenosis.Echocardiogram ejection fraction 10% grade 1 diastolic dysfunction. Blood pressure was elevated requiring immediate intervention with labetalol IV. NIH stroke score was 5  Good BM this am  Review of Systems - Negative except as above Objective: Vital Signs: Blood pressure 122/67, pulse 80, temperature 98.2 F (36.8 C), temperature source Oral, resp. rate 17, height 5' (1.524 m), weight 61.78 kg (136 lb 3.2 oz), SpO2 97 %. No results found. Results for orders placed or performed during the hospital encounter of 08/03/14 (from the past 72 hour(s))  Glucose, capillary     Status: Abnormal   Collection Time: 08/08/14 11:43 AM  Result Value Ref Range   Glucose-Capillary 117 (H) 70 - 99 mg/dL  Glucose, capillary     Status: Abnormal   Collection Time: 08/08/14  4:36 PM  Result Value Ref Range   Glucose-Capillary 163 (H) 70 - 99 mg/dL  Glucose, capillary     Status: Abnormal   Collection Time: 08/08/14  9:42 PM  Result Value Ref Range   Glucose-Capillary 103 (H) 70 - 99 mg/dL  Glucose, capillary     Status: Abnormal   Collection Time: 08/09/14  6:41 AM  Result Value Ref Range   Glucose-Capillary 119 (H) 70 - 99 mg/dL  Glucose, capillary     Status: None   Collection Time: 08/09/14 11:57 AM  Result Value Ref Range   Glucose-Capillary 98 70 - 99 mg/dL   Comment 1 Notify RN   Glucose, capillary     Status: Abnormal   Collection Time: 08/09/14  5:05 PM  Result Value Ref Range   Glucose-Capillary 137 (H) 70 - 99 mg/dL   Comment 1 Notify RN   Glucose, capillary     Status: Abnormal   Collection Time: 08/09/14  9:14 PM  Result Value Ref Range   Glucose-Capillary 101 (H) 70 - 99 mg/dL  Creatinine, serum     Status: Abnormal   Collection Time: 08/10/14  3:57 AM  Result Value Ref Range   Creatinine, Ser 0.70 0.50 - 1.10 mg/dL   GFR calc non Af Amer 85 (L) >90 mL/min   GFR calc Af Amer >90 >90 mL/min    Comment: (NOTE) The eGFR has been calculated using the CKD EPI equation. This calculation has not been validated in all clinical situations. eGFR's persistently <90 mL/min signify possible Chronic Kidney Disease.   Glucose, capillary     Status: None   Collection Time: 08/10/14  6:35 AM  Result Value Ref Range   Glucose-Capillary 97 70 - 99 mg/dL  Glucose, capillary     Status: Abnormal   Collection Time: 08/10/14 11:42 AM  Result Value Ref Range   Glucose-Capillary 151 (H) 70 - 99 mg/dL  Glucose, capillary     Status: Abnormal   Collection Time: 08/10/14  4:43 PM  Result Value Ref Range   Glucose-Capillary 109 (H) 70 - 99 mg/dL  Glucose, capillary     Status: Abnormal   Collection Time: 08/10/14  9:26 PM  Result Value Ref Range   Glucose-Capillary 121 (H) 70 - 99 mg/dL   Comment 1  Notify RN   Glucose, capillary     Status: Abnormal   Collection Time: 08/11/14  6:46 AM  Result Value Ref Range   Glucose-Capillary 111 (H) 70 - 99 mg/dL     HEENT: normal Cardio: RRR and no murmur Resp: CTA B/L and unlabored GI: BS positive and NT,ND Extremity:  No Edema Skin:   Intact Neuro: Alert/Oriented, Cranial Nerve II-XII normal, Abnormal Sensory absent LT in Right hand and foot, Abnormal Motor 4/5 on Right side, 5/5 on left and Abnormal FMC Ataxic/ dec FMC RUE and RLE.Absent PP/LT and proprioception on right.  Musc/Skel:  Normal Gen NAD   Assessment/Plan: 1. 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 Physiatrist is providing close team supervision and 24 hour management of active medical problems listed below. Physiatrist and rehab team continue to assess barriers to discharge/monitor patient progress toward functional and medical goals. Team conference today please see physician documentation under team conference tab, met with team face-to-face to discuss problems,progress, and goals. Formulized individual treatment plan based on medical history, underlying problem and comorbidities. FIM: FIM - Bathing Bathing Steps Patient Completed: Front perineal area, Buttocks Bathing: 4: Steadying assist (only washed perineal area and buttocks)  FIM - Upper Body Dressing/Undressing Upper body dressing/undressing steps patient completed: Thread/unthread right bra strap, Thread/unthread left bra strap, Thread/unthread left sleeve of pullover shirt/dress, Thread/unthread right sleeve of pullover shirt/dresss, Put head through opening of pull over shirt/dress, Pull shirt over trunk Upper body dressing/undressing: 4: Min-Patient completed 75 plus % of tasks FIM - Lower Body Dressing/Undressing Lower body dressing/undressing steps patient completed: Thread/unthread right underwear leg, Thread/unthread left underwear leg, Pull underwear up/down, Thread/unthread left pants leg, Pull pants up/down, Don/Doff left shoe, Thread/unthread right pants leg Lower body dressing/undressing: 3: Mod-Patient completed 50-74% of tasks  FIM - Toileting Toileting steps completed by patient: Adjust clothing prior to toileting, Adjust clothing after toileting Toileting Assistive Devices: Grab bar or rail for support Toileting: 3: Mod-Patient completed 2 of 3 steps  FIM - Radio producer Devices: Product manager Transfers: 3-To toilet/BSC: Mod A (lift or lower assist), 3-From toilet/BSC: Mod A (lift or lower  assist)  FIM - Control and instrumentation engineer Devices: Arm rests Bed/Chair Transfer: 4: Bed > Chair or W/C: Min A (steadying Pt. > 75%), 3: Chair or W/C > Bed: Mod A (lift or lower assist)  FIM - Locomotion: Wheelchair Distance: 100 Locomotion: Wheelchair: 2: Travels 50 - 149 ft with minimal assistance (Pt.>75%) FIM - Locomotion: Ambulation Locomotion: Ambulation Assistive Devices: Administrator Ambulation/Gait Assistance: 3: Mod assist Locomotion: Ambulation: 2: Travels 50 - 149 ft with moderate assistance (Pt: 50 - 74%)  Comprehension Comprehension Mode: Auditory Comprehension: 5-Understands complex 90% of the time/Cues < 10% of the time  Expression Expression Mode: Verbal Expression: 5-Expresses complex 90% of the time/cues < 10% of the time  Social Interaction Social Interaction: 7-Interacts appropriately with others - No medications needed.  Problem Solving Problem Solving: 5-Solves basic problems: With no assist  Memory Memory: 5-Recognizes or recalls 90% of the time/requires cueing < 10% of the time  Medical Problem List and Plan: 1. 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 DVT Prophylaxis/Anticoagulation: Lovenox initiated 08/03/2014 for DVT prophylaxis. Monitor platelet counts of any signs of bleeding 3. Pain Management: Tylenol as needed, sensory dysesthesia, monitor may need gabapentin if it worsens  4. COPD.Dulera twice a day. 5. Neuropsych: This patient is capable of making decisions on her own behalf. 6. Skin/Wound Care: Routine skin checks 7. Fluids/Electrolytes/Nutrition: Strict I and O's. Labs nl   8. Hypertension. Lisinopril 10 mg daily. Monitor with increased mobility  LOS (Days) 8 A FACE TO FACE EVALUATION WAS PERFORMED  KIRSTEINS,ANDREW E 08/11/2014, 9:03 AM

## 2014-08-11 NOTE — Patient Care Conference (Signed)
Inpatient RehabilitationTeam Conference and Plan of Care Update Date: 08/11/2014   Time: 11:00 AM    Patient Name: Sherry Lambert      Medical Record Number: [id number]  Date of Birth: 1941/04/21 Sex: Female         Room/Bed: 4W20C/4W20C-01 Payor Info: Payor: [employer] MEDICARE / Plan: [place] MEDICARE HMO/PPO / Product Type: *No Product type* /    Admitting Diagnosis: L CVA  Admit Date/Time:  08/03/2014  6:07 PM Admission Comments: No comment available   Primary Diagnosis:  Nontraumatic intracerebral hemorrhage Principal Problem: Nontraumatic intracerebral hemorrhage  Patient Active Problem List   Diagnosis Date Noted  . 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 Nontraumatic intracerebral hemorrhage 08/03/2014  . Cerebral hemorrhage   . Cytotoxic brain edema   . Intracerebral hemorrhage 07/28/2014  . Hypertensive urgency 07/28/2014  . [name] (intracerebral hemorrhage) 07/28/2014  . Peripheral arterial disease 01/05/2014  . Osteopenia 11/12/2013  . Dyslipidemia 09/19/2013  . Stricture and stenosis of esophagus 12/23/2012  . Dysphagia, unspecified 12/01/2012  . [name] 12/01/2012  . DM type 2 goal A1C below 7.5 11/10/2012  . HTN (hypertension) 11/10/2012  . Other emphysema 11/10/2012    Expected Discharge Date: Expected Discharge Date: 08/20/14  Team Members Present: Physician leading conference: Dr. Alysia Penna Social Worker Present: Ovidio Kin, LCSW Nurse Present: Rayetta Pigg, RN PT Present: Billie Ruddy, Renaye Rakers, PT OT Present: Meriel Pica, Jules Schick, OT SLP Present: Windell Moulding, SLP PPS Coordinator present : Daiva Nakayama, RN, CRRN     Current Status/Progress Goal Weekly Team Focus  Medical   Right sensory deficits poor proprioception  maximize independance  d/c planning   Bowel/Bladder   Continent of bowel and bladder.          Swallow/Nutrition/  Hydration     na        ADL's   min-mod assist bathing, min assist UB dressing, mod assist LB dressing, min-mod assist stand pivot transfers  mod I overall with self-care tasks, supervision shower transfer and homemaking tasks  RUE NMR and functional use, standing balance, transfers, homemaking tasks   Mobility   Min to Mod A overall  Mod I at w/c level; supervision ambulation, stairs  NMR, attention to RUE/LE, dynamic standing balance, postural/gait stability   Communication     met goals-sp discharged        Safety/Cognition/ Behavioral Observations    no unsafe behaviors        Pain   No complaints of pain.         Skin   Scaddered rash to back. Patient complains of itching. PRN benadryl   Remain from skin breakdown. Keep discomfort from rash under control.   Assess skin q shift. Monitor rash and itching.      *See Care Plan and progress notes for long and short-term goals.  Barriers to Discharge: see above    Possible Resolutions to Barriers:  cont rehab    Discharge Planning/Teaching Needs:  Home family currently trying to fond someone to stay with her, aware of recommendation of 24 hr care at discharge      Team Discussion:  SP-dc on regular diet. Sensory impairment-which she will require supervision level for safety for at discharge. Team to downgrade goals to supervision level  Revisions to Treatment Plan:  Downgraded goals to supervision level   Continued Need for Acute Rehabilitation Level of Care: The patient requires daily  medical management by a physician with specialized training in physical medicine and rehabilitation for the following conditions: Daily direction of a multidisciplinary physical rehabilitation program to ensure safe treatment while eliciting the highest outcome that is of practical value to the patient.: Yes Daily medical management of patient stability for increased activity during participation in an intensive rehabilitation regime.: Yes Daily  analysis of laboratory values and/or radiology reports with any subsequent need for medication adjustment of medical intervention for : Neurological problems  Jessic Standifer, Gardiner Rhyme 08/12/2014, 11:10 AM

## 2014-08-11 NOTE — Progress Notes (Signed)
Occupational Therapy Weekly Progress Note  Patient Details  Name: Sherry Lambert MRN: [id number] Date of Birth: Nov 09, 1940  Beginning of progress report period: August 04, 2014 End of progress report period: August 11, 2014  Today's Date: 08/11/2014 OT Individual Time: 0900-1000 and [id number] OT Individual Time Calculation (min): 60 min and 30 min   Patient has met 3 of 4 short term goals.  Pt is making steady progress towards goals.  Pt continues to require min-mod assist with transfers due to decreased sensation and proprioception of RUE and RLE.  Requires verbal cues for attention to RUE during self-care tasks to improve function.  Pt with good strength in RUE, but continues to have difficulty grading pressure and movements.   Patient continues to demonstrate the following deficits: impaired proprioception and sensation in RUE and RLE, decreased awareness of deficits, decreased ability to grade movements with RUE, decreased dynamic standing balance and therefore will continue to benefit from skilled OT intervention to enhance overall performance with BADL, iADL and Reduce care partner burden.  Patient not progressing toward long term goals.  See goal revision..  Plan of care revisions: downgraded to supervision overall secondary to decreased sensation and proprioception in RUE - causing concern with safety during mobility and self-care tasks.  OT Short Term Goals Week 1:  OT Short Term Goal 1 (Week 1): Pt will perform grooming while standing at sink side with Min A balance during self care task. OT Short Term Goal 1 - Progress (Week 1): Met OT Short Term Goal 2 (Week 1): Pt will perform shower transfer with Mod A in order to decrease assistance with transfers. OT Short Term Goal 2 - Progress (Week 1): Met OT Short Term Goal 3 (Week 1): Pt will perform LB dressing with Mod A in order to increase I with self care. OT Short Term Goal 3 - Progress (Week 1): Met OT Short Term Goal 4 (Week  1): Pt will perform UB dressing with set up A in order to increase I with self care task. OT Short Term Goal 4 - Progress (Week 1): Progressing toward goal Week 2:  OT Short Term Goal 1 (Week 2): Pt will perform UB dressing with supervision (fastening bra in lap and don over head)  OT Short Term Goal 2 (Week 2): Pt will complete LB dressing with min assist OT Short Term Goal 3 (Week 2): Pt will complete toilet transfer with min assist OT Short Term Goal 4 (Week 2): Pt will complete shower transfer with min assist OT Short Term Goal 5 (Week 2): Pt will complete simpe meal prep with min assist, utilizing RUE as gross assist  Skilled Therapeutic Interventions/Progress Updates:    1) Engaged in ADL retraining with focus on functional transfers, sit <> stand, standing balance, and functional use of RUE during self-care tasks of bathing, dressing, and grooming.  Performed stand step transfer from w/c to shower chain in room shower with min assist and heavy use of grab bar. Pt required min verbal cues throughout bathing to incorporate RUE and visually attend to task to improve success, as pt dropped wash cloth multiple times due to decreased sensation.  Pt recalled technique for donning bra, however required multimodal cues to carry out task and physical assist to pull it down over back.  Verbal cues for hand placement with sit > stand, to push up from w/c and not pull up on sink.  Grooming completed in standing with min/steady assist and min verbal cues to weight  shift to Rt as pt tends to keep weight off of RLE.  Pt continues to require assist with donning Rt shoe and tying shoes.  Theraputty activity with focus on three jaw chuck, pinch, and increased [place] with pt dropped putty approx 3 times.  2) Engaged in therapeutic activity with focus on increased functional use of RUE with grading pressure when obtaining items (clothespins and styrofoam cup).  Pt tends to grasp items with increased pressure tending to  shoot clothespins out of grasp or crush empty cup, educated on visually attending to task to attempt to grade pressure with minimal carryover.  Increased challenge by cutting slits into cup with pt demonstrating decreased ability to pick up cup without crushing it.  Standing activity with focus on weight shifting to Rt to promote midline WB in standing, with having pt reach to Rt and up to promote weight shift onto RLE, requiring tactile cues at Lt hip to promote weight shift to Rt.  Pt with difficulty with reaching task with RUE tending to overshoot, grasp without releasing, or grasping too hard so object flies out of hand - again educated to visually attend to hand with some carryover which ceases as frustration increases.  Therapy Documentation Precautions:  Precautions Precautions: Fall Precaution Comments: impulsive, R inattention Restrictions Weight Bearing Restrictions: No General:   Vital Signs: Therapy Vitals BP: 122/67 mmHg Oxygen Therapy SpO2: 97 % O2 Device: Not Delivered Pain:  Pt with no c/o pain  See FIM for current functional status  Therapy/Group: Individual Therapy  Simonne Come 08/11/2014, 10:45 AM

## 2014-08-11 NOTE — Progress Notes (Signed)
Physical Therapy Session Note  Patient Details  Name: Sherry Lambert MRN: [id number] Date of Birth: 1941/03/21  Today's Date: 08/11/2014 PT Individual Time: 1000-1030 PT Individual Time Calculation (min): 30 min   Short Term Goals: Week 1:  PT Short Term Goal 1 (Week 1): Pt will perform supine<>sit with supervision with HOB flat and no rails. PT Short Term Goal 2 (Week 1): Pt will transfer from bed<>w/c with min A and 25% cueing for sequencing, technique. PT Short Term Goal 3 (Week 1): Pt will perform gait x50' with LRAD and mod A of single therapist. PT Short Term Goal 4 (Week 1): Pt will perform w/c mobility x150' with supervision and 25% cueing for technique, obstacle negotiation. PT Short Term Goal 5 (Week 1): Pt will perform static standing x60 seconds without UE support with close supervision to min guard and no overt LOB.  Skilled Therapeutic Interventions/Progress Updates:   Pt received sitting in w/c, agreeable to therapy. Pt propelled w/c using BLE x 150 ft with supervision, min cues for attending to R. Pt transitioned into tall kneeling on mat table for R sided NMR with bench in front initially for BUE support. Pt performed squats in tall kneeling and then progressed to quadruped with RUE weightbearing while reaching across midline placing horseshoes to target on R side to increase WB through RUE. Pt transferred into R side sitting > short sitting at edge of mat with min A. Gait training using RW with R hand orthosis x 130 ft with min-mod A and verbal cues for step-through gait pattern and forward gaze as pt tends to look down at feet. Pt able to maintain forward gaze with min cues but decreased R foot clearance noted. Pt returned to room and left sitting in w/c with all needs within reach.   Therapy Documentation Precautions:  Precautions Precautions: Fall Precaution Comments: impulsive, R inattention Restrictions Weight Bearing Restrictions: No Vital Signs: Therapy Vitals BP:  122/67 mmHg Oxygen Therapy SpO2: 97 % O2 Device: Not Delivered Pain: Pain Assessment Pain Assessment: No/denies pain  See FIM for current functional status  Therapy/Group: Individual Therapy  Laretta Alstrom 08/11/2014, 11:29 AM

## 2014-08-11 NOTE — Progress Notes (Signed)
Social Work Patient ID: Sherry Lambert, female   DOB: 1941/04/03, 73 y.o.   MRN: [id number] Met with pt and spoke with son via telephone to discuss team conference goals-sueprvision level and discharge 12/18.  She has a lady her son knows that will be staying with her. Will have family and caregiver come in for training prior to discharge.  Pt is happy with her progress and with the date.

## 2014-08-11 NOTE — Progress Notes (Addendum)
Physical Therapy Session Note  Patient Details  Name: Sherry Lambert MRN: [id number] Date of Birth: 08-20-1941  Today's Date: 08/11/2014 PT Individual Time: 1500-1600 PT Individual Time Calculation (min): 60 min   Short Term Goals: Week 1:  PT Short Term Goal 1 (Week 1): Pt will perform supine<>sit with supervision with HOB flat and no rails. PT Short Term Goal 2 (Week 1): Pt will transfer from bed<>w/c with min A and 25% cueing for sequencing, technique. PT Short Term Goal 3 (Week 1): Pt will perform gait x50' with LRAD and mod A of single therapist. PT Short Term Goal 4 (Week 1): Pt will perform w/c mobility x150' with supervision and 25% cueing for technique, obstacle negotiation. PT Short Term Goal 5 (Week 1): Pt will perform static standing x60 seconds without UE support with close supervision to min guard and no overt LOB.   Skilled Therapeutic Interventions/Progress Updates:    Pt received seated in w/c; agreeable to therapy. Session focused on increasing stability/independence with functional transfers and gait in home environment. Pt performed w/c mobility x150' in controlled and home environments with L hemi technique and supervision, increased time. In rehab apartment, educated pt on fall recovery, situations in which to activate EMS after fall. Explained, demonstrated floor transfer (seated on couch<>long sitting on mat on floor) for fall recovery and quadruped position (to elicit RUE/LE weightbearing, proprioception). Pt then performed floor transfer with mod A and 25% cueing for technique, awareness of RUE positioning/safety.  Pt negotiated 5 stairs forward-facing with R rail and step-to pattern, min A, and min cueing for sequencing with effective return demonstration for remainder of trial. Pt performed functional ambulation x90' then x110' in controlled environment with rolling walker, R hand orthosis, and close supervision to min guard. Transitioned to gait x25' with [facility] and min A,  50% cueing for sequencing. Returned to room via gait x100' in controlled environment without assistive device with min A, verbal/tactile cueing for upright posture, bilat hip extension and decreased step length during turns. Session ended in pt room, where pt was left seated in w/c with all needs within reach.  Addendum: Long term goals addressing ambulation downgraded to supervision.  Therapy Documentation Precautions:  Precautions Precautions: Fall Precaution Comments: impulsive, R inattention Restrictions Weight Bearing Restrictions: No Vital Signs: Therapy Vitals Temp: 97.7 F (36.5 C) Temp Source: Oral Pulse Rate: 83 Resp: 17 BP: (!) 142/50 mmHg Patient Position (if appropriate): Sitting Oxygen Therapy SpO2: 95 % O2 Device: Not Delivered Pain: Pain Assessment Pain Assessment: No/denies pain Locomotion : Ambulation Ambulation/Gait Assistance: 4: Min assist;4: Min guard Wheelchair Mobility Distance: 150   See FIM for current functional status  Therapy/Group: Individual Therapy  Elzada Pytel, Malva Cogan 08/11/2014, 4:20 PM

## 2014-08-12 ENCOUNTER — Inpatient Hospital Stay (HOSPITAL_COMMUNITY): Payer: Medicare HMO | Admitting: Physical Therapy

## 2014-08-12 ENCOUNTER — Inpatient Hospital Stay (HOSPITAL_COMMUNITY): Payer: Medicare HMO | Admitting: *Deleted

## 2014-08-12 ENCOUNTER — Inpatient Hospital Stay (HOSPITAL_COMMUNITY): Payer: Medicare HMO | Admitting: Occupational Therapy

## 2014-08-12 DIAGNOSIS — S39011A Strain of muscle, fascia and tendon of abdomen, initial encounter: Secondary | ICD-10-CM

## 2014-08-12 LAB — GLUCOSE, CAPILLARY
GLUCOSE-CAPILLARY: 100 mg/dL — AB (ref 70–99)
Glucose-Capillary: 135 mg/dL — ABNORMAL HIGH (ref 70–99)
Glucose-Capillary: 207 mg/dL — ABNORMAL HIGH (ref 70–99)
Glucose-Capillary: 97 mg/dL (ref 70–99)

## 2014-08-12 NOTE — Progress Notes (Signed)
Physical Therapy Weekly Progress Note  Patient Details  Name: Sherry Lambert MRN: [id number] Date of Birth: [birth year]  Beginning of progress report period: August 04, 2014 End of progress report period: August 12, 2014  Today's Date: 08/12/2014 PT Individual Time: 1102-1202 and [id number] PT Individual Time Calculation (min): 60 min and 60 min  Patient has met 4 of 5 short term goals.  Pt demonstrates significant progress with stability/independence with functional mobility. Pt progress has been limited by decreased anticipatory awareness to compensate for sensory impairments in RUE/RLE.  Patient continues to demonstrate the following deficits: unbalanced muscle activation, decreased attention to right, decreased memory, decreased standing balance, decreased postural control, hemiplegia and decreased balance strategies. and therefore will continue to benefit from skilled PT intervention to enhance overall performance with balance, postural control, ability to compensate for deficits, functional use of  right upper extremity and right lower extremity and coordination.  Patient progressing toward long term goals..  Plan of care revisions: Modified long term goal addressing stair negotiation secondary to son currently adding bilateral rails.  PT Short Term Goals Week 1:  PT Short Term Goal 1 (Week 1): Pt will perform supine<>sit with supervision with HOB flat and no rails. PT Short Term Goal 1 - Progress (Week 1): Met PT Short Term Goal 2 (Week 1): Pt will transfer from bed<>w/c with min A and 25% cueing for sequencing, technique. PT Short Term Goal 2 - Progress (Week 1): Met PT Short Term Goal 3 (Week 1): Pt will perform gait x50' with LRAD and mod A of single therapist. PT Short Term Goal 3 - Progress (Week 1): Met PT Short Term Goal 4 (Week 1): Pt will perform w/c mobility x150' with supervision and 25% cueing for technique, obstacle negotiation. PT Short Term Goal 4 - Progress (Week 1):  Met PT Short Term Goal 5 (Week 1): Pt will perform static standing x60 seconds without UE support with close supervision to min guard and no overt LOB. PT Short Term Goal 5 - Progress (Week 1): Progressing toward goal Week 2:  PT Short Term Goal 1 (Week 2): STG's = LTG's secondary to anticipated LOS  Skilled Therapeutic Interventions/Progress Updates:    Treatment Session 1: Pt received seated in w/c; agreeable to therapy. Session focused on continued gait training with focus on increasing RLE clearance during RLE advancement. Pt performed simulated car transfer x2 trials with min guard, mod cueing for technique, attention to RUE. Pt with effective within-session carryover of 50% of cueing provided during initial trial. Transitioned to functional ambulation 2x150' and 1x175' in controlled environment with rolling walker and close supervision to min A secondary to frequent R toe catch during RLE swing. Multimodal cueing focused on upright posture, forward gaze, ateral weight shift to L side prior to initiating RLE advancement, R gluteus maximus/medius activation during RLE stance phase, and R heel strike. Initially provided cueing for increased R hip/knee flexion during RLE advancement; however, said cueing resulted in RLE steppage gait pattern. Donned R Foot Up brace to facilitate R ankle dorsiflexion during R swing phase. While wearing Foot Up brace, pt demonstrated less frequent LOB secondary to R toe catch; however, pt reporting dislike of orthosis as it "makes my [right] foot feel heavy," per pt. Educated pt on importance of addressing issue prior to discharge to decrease fall risk. Pt will likely need reinforcement. Will consider leather R toe cap. See below for detailed description of NMR.  Pt reporting 1/10 pain in R knee (superior/medial to patella)  during ambulation. Said pain did interfere with pt tolerance to standing/gait during this session. Observation of knee revealed soft tissue swelling  medial to joint line. Also noted weakness of final 30 degrees of R knee extension. Explained, demonstrated R terminal knee extension exercise to increase R VMO strength to promote normalized R patellar tracking. RN made aware of pain. Session ended in pt room, where pt was left seated in w/c with visitor present and all needs within reach.  Treatment Session 2: Pt received seated in w/c; agreeable to therapy. Pt performed functional ambulation 2 x130' in controlled environment with rolling walker and close supervision to min A; cueing as described in AM session with improved carryover of R heel strike. Following ambulation trial, pt reporting increased pain in R knee. This PT retrieved R elevated leg rest for w/c and utilized ACE bandage for compression. Of note, pt reported less R knee pain when ambulating with ACE bandage on R knee. Notified RN of ongoing pain, interference with PT. Due to knee pain with ambulation, remainder of session focused supine RLE NMR. See below for detailed description. Session ended in pt room, where pt was left seated in w/c with all needs within reach.  Therapy Documentation Precautions:  Precautions Precautions: Fall Precaution Comments: impulsive, R inattention Restrictions Weight Bearing Restrictions: No Vital Signs: Oxygen Therapy O2 Device: Not Delivered Pain: Pain Assessment Pain Assessment: 0-10 Pain Score: 1  Pain Type: Acute pain Pain Location: Knee Pain Orientation: Right Pain Descriptors / Indicators: Aching Pain Onset: With Activity Pain Intervention(s): RN made aware Locomotion : Ambulation Ambulation/Gait Assistance: 5: Supervision;4: Min guard;4: Min assist  NMR: AM session: Seated EOM, explained/demonstrated R ankle dorsiflexion with R knee flexion initially then transitioning to extension to increase selective control of RLE movement and increase gait stability. Pt gave effective return demonstration of exercise. Recommended pt continue  exercise in room during therapy sessions. PM session: Semi reclined on mat table, pt performed RLE D2 flexion/extension PNF x15 reps resisted. Cueing focused on isolated control of R ankle dorsiflexion with concurrent R knee extension and facilitating R hip abduction during D2 extension to promote more normalized gait pattern. RLE D2 flexion/extension PNF slow reversal hold to facilitate pt initiation of R hip/knee flexion (to increase RLE clearance during gait); tactile cueing at R ankle dorsiflexors to increase selective control of R ankle dorsiflexion with concurrent hip/knee flexion. RLE D2 extension focused on eccentric control of R knee extension.   See FIM for current functional status  Therapy/Group: Individual Therapy  Demiah Gullickson, Malva Cogan 08/12/2014, 4:28 PM

## 2014-08-12 NOTE — Progress Notes (Signed)
Recreational Therapy Assessment and Plan  Patient Details  Name: Sherry Lambert MRN: [id number] Date of Birth: [birth year] Today's Date: 08/12/2014  Rehab Potential: Good ELOS: 10 days   Assessment Clinical Impression: Problem List:  Patient Active Problem List   Diagnosis Date Noted  . 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 Nontraumatic intracerebral hemorrhage 08/03/2014  . Cerebral hemorrhage   . Cytotoxic brain edema   . Intracerebral hemorrhage 07/28/2014  . Hypertensive urgency 07/28/2014  . [name] (intracerebral hemorrhage) 07/28/2014  . Peripheral arterial disease 01/05/2014  . Osteopenia 11/12/2013  . Dyslipidemia 09/19/2013  . Stricture and stenosis of esophagus 12/23/2012  . Dysphagia, unspecified 12/01/2012  . [name] 12/01/2012  . DM type 2 goal A1C below 7.5 11/10/2012  . HTN (hypertension) 11/10/2012  . Other emphysema 11/10/2012    Past Medical History:  Past Medical History  Diagnosis Date  . Emphysema of lung   . GERD (gastroesophageal reflux disease)   . Hypertension   . [name]   . [name]   . Peripheral arterial disease   . Diabetes mellitus without complication    Past Surgical History:  Past Surgical History  Procedure Laterality Date  . Tonsillectomy    . Abdominal hysterectomy    . Colonoscopy    . Esophagogastroduodenoscopy N/A 12/23/2012    Procedure: ESOPHAGOGASTRODUODENOSCOPY (EGD); Surgeon: Inda Castle, MD; Location: [facility]; Service: Endoscopy; Laterality: N/A;  . Balloon dilation N/A 12/23/2012    Procedure: BALLOON DILATION; Surgeon: Inda Castle, MD; Location: [facility]; Service: Endoscopy; Laterality: N/A;  . Esophagogastroduodenoscopy N/A 01/15/2013    Procedure:  ESOPHAGOGASTRODUODENOSCOPY (EGD); Surgeon: Inda Castle, MD; Location: [facility]; Service: Endoscopy; Laterality: N/A;  . Balloon dilation N/A 01/15/2013    Procedure: BALLOON DILATION; Surgeon: Inda Castle, MD; Location: [facility]; Service: Endoscopy; Laterality: N/A;    Assessment & Plan Clinical Impression: Sherry Lambert is a 73 y.o. female with hypertension, peripheral vascular disease, diabetes mellitus, peripheral arterial disease and COPD experienced acute onset of weakness and numbness involving the right side on 07/28/2014. She had been taking aspirin daily. 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 MRI showed acute subacute left thalamic hemorrhage. Carotid Dopplers no ICA stenosis.Echocardiogram ejection fraction 74% grade 1 diastolic dysfunction. Blood pressure was elevated requiring immediate intervention with labetalol IV. NIH stroke score was 5. Patient was not administered TPA secondary to hemorrhage. Lovenox was later initiated for DVT prophylaxis 08/03/2014. Patient is tolerating a regular diet. Placed on intravenous Rocephin 5 days for Escherichia coli UTI to be completed 08/04/2014. Patient transferred to CIR on 08/03/2014.   Pt presents with decreased activity tolerance, decreased functional mobility, decreased balance, decreased midline orientation, right inattention, right sided weakness, decreased memory, decreased awareness Limiting pt's independence with leisure/community pursuits.    Leisure History/Participation Premorbid leisure interest/current participation: Medical laboratory scientific officer - Building control surveyor - Shopping mall Other Leisure Interests: Television;Reading;Cooking/Baking;Housework Leisure Participation Style: With Family/Friends Awareness of Community Resources: Excellent Psychosocial / Spiritual Patient agreeable to Pet Therapy:  Yes Does patient have pets?: Yes (dog & cat) Social interaction - Mood/Behavior: Cooperative Academic librarian Appropriate for Education?: Yes Recreational Therapy Orientation Orientation -Reviewed with patient: Available activity resources Strengths/Weaknesses Patient Strengths/Abilities: Willingness to participate;Active premorbidly Patient weaknesses: Physical limitations TR Patient demonstrates impairments in the following area(s): Endurance;Motor;Safety;Skin Integrity  Plan Rec Therapy  Plan Is patient appropriate for Therapeutic Recreation?: Yes Rehab Potential: Good Treatment times per week: Min 1 time per week >20 minutes Estimated Length of Stay: 10 days TR Treatment/Interventions: Adaptive equipment instruction;1:1 session;Balance/vestibular training;Functional mobility training;Community reintegration;Patient/family education;Therapeutic activities;Recreation/leisure participation;UE/LE Coordination activities;Therapeutic exercise;Wheelchair propulsion/positioning  Recommendations for other services: None  Discharge Criteria: Patient will be discharged from TR if patient refuses treatment 3 consecutive times without medical reason.  If treatment goals not met, if there is a change in medical status, if patient makes no progress towards goals or if patient is discharged from hospital.  The above assessment, treatment plan, treatment alternatives and goals were discussed and mutually agreed upon: by patient  [place] 08/12/2014, 12:15 PM

## 2014-08-12 NOTE — Progress Notes (Signed)
Occupational Therapy Session Note  Patient Details  Name: Sherry Lambert MRN: [id number] Date of Birth: 27-Nov-1940  Today's Date: 08/12/2014 OT Individual Time: 0930-1030 OT Individual Time Calculation (min): 60 min    Short Term Goals: Week 2:  OT Short Term Goal 1 (Week 2): Pt will perform UB dressing with supervision (fastening bra in lap and don over head)  OT Short Term Goal 2 (Week 2): Pt will complete LB dressing with min assist OT Short Term Goal 3 (Week 2): Pt will complete toilet transfer with min assist OT Short Term Goal 4 (Week 2): Pt will complete shower transfer with min assist OT Short Term Goal 5 (Week 2): Pt will complete simpe meal prep with min assist, utilizing RUE as gross assist  Skilled Therapeutic Interventions/Progress Updates:    Engaged in therapeutic activity with focus on functional use of RUE, standing balance, and transfers from various surfaces.  Pt reports already having completed dressing prior to session. Pt with multiple questions, from son, regarding bathroom equipment.  Discussed recommendation for shower chair, grab bars and where they can purchase grab bars.  Therapeutic activity in standing with focus on weight shifting to Rt and functional use of RUE with card activity.  Pt with difficulty picking cards off stack, educated on visually attending to Rt hand to improve coordination.  In ADL apt ambulated with RW with min assist with no verbal cues to remain inside RW this session, engaged in sit <> stand from various surfaces with focus on increased weight shifting and not pulling up on RW.    Therapy Documentation Precautions:  Precautions Precautions: Fall Precaution Comments: impulsive, R inattention Restrictions Weight Bearing Restrictions: No General:   Vital Signs: Therapy Vitals BP: 129/70 mmHg Oxygen Therapy O2 Device: Not Delivered Pain: Pain Assessment Pain Assessment: No/denies pain  See FIM for current functional  status  Therapy/Group: Individual Therapy  Simonne Come 08/12/2014, 10:50 AM

## 2014-08-12 NOTE — Progress Notes (Signed)
Subjective/Complaints: 73 y.o. female with hypertension, peripheral vascular disease, diabetes mellitus, peripheral arterial disease and COPD experienced acute onset of weakness and numbness involving the right side on 07/28/2014. She had been taking aspirin daily. 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 MRI showed acute subacute left thalamic hemorrhage. Carotid Dopplers no ICA stenosis.Echocardiogram ejection fraction 69% grade 1 diastolic dysfunction. Blood pressure was elevated requiring immediate intervention with labetalol IV. NIH stroke score was 5  Can't feel on RIght side Also left side of belly is sore "when I mash"  Review of Systems - Negative except as above Objective: Vital Signs: Blood pressure 131/67, pulse 64, temperature 97.6 F (36.4 C), temperature source Oral, resp. rate 18, height 5' (1.524 m), weight 63.05 kg (139 lb), SpO2 96 %. No results found. Results for orders placed or performed during the hospital encounter of 08/03/14 (from the past 72 hour(s))  Glucose, capillary     Status: None   Collection Time: 08/09/14 11:57 AM  Result Value Ref Range   Glucose-Capillary 98 70 - 99 mg/dL   Comment 1 Notify RN   Glucose, capillary     Status: Abnormal   Collection Time: 08/09/14  5:05 PM  Result Value Ref Range   Glucose-Capillary 137 (H) 70 - 99 mg/dL   Comment 1 Notify RN   Glucose, capillary     Status: Abnormal   Collection Time: 08/09/14  9:14 PM  Result Value Ref Range   Glucose-Capillary 101 (H) 70 - 99 mg/dL  Creatinine, serum     Status: Abnormal   Collection Time: 08/10/14  3:57 AM  Result Value Ref Range   Creatinine, Ser 0.70 0.50 - 1.10 mg/dL   GFR calc non Af Amer 85 (L) >90 mL/min   GFR calc Af Amer >90 >90 mL/min    Comment: (NOTE) The eGFR has been calculated using the CKD EPI equation. This calculation has not been validated in  all clinical situations. eGFR's persistently <90 mL/min signify possible Chronic Kidney Disease.   Glucose, capillary     Status: None   Collection Time: 08/10/14  6:35 AM  Result Value Ref Range   Glucose-Capillary 97 70 - 99 mg/dL  Glucose, capillary     Status: Abnormal   Collection Time: 08/10/14 11:42 AM  Result Value Ref Range   Glucose-Capillary 151 (H) 70 - 99 mg/dL  Glucose, capillary     Status: Abnormal   Collection Time: 08/10/14  4:43 PM  Result Value Ref Range   Glucose-Capillary 109 (H) 70 - 99 mg/dL  Glucose, capillary     Status: Abnormal   Collection Time: 08/10/14  9:26 PM  Result Value Ref Range   Glucose-Capillary 121 (H) 70 - 99 mg/dL   Comment 1 Notify RN   Glucose, capillary     Status: Abnormal   Collection Time: 08/11/14  6:46 AM  Result Value Ref Range   Glucose-Capillary 111 (H) 70 - 99 mg/dL  Glucose, capillary     Status: Abnormal   Collection Time: 08/11/14 11:31 AM  Result Value Ref Range   Glucose-Capillary 138 (H) 70 - 99 mg/dL  Glucose, capillary     Status: Abnormal   Collection Time: 08/11/14  4:40 PM  Result Value Ref Range   Glucose-Capillary 107 (H) 70 - 99 mg/dL  Glucose, capillary     Status: Abnormal   Collection Time: 08/11/14  9:04 PM  Result Value Ref Range   Glucose-Capillary 122 (H) 70 - 99 mg/dL  Glucose, capillary     Status: Abnormal   Collection Time: 08/12/14  6:49 AM  Result Value Ref Range   Glucose-Capillary 135 (H) 70 - 99 mg/dL     HEENT: normal Cardio: RRR and no murmur Resp: CTA B/L and unlabored GI: BS positive and NT,ND Extremity:  No Edema Skin:   Intact Neuro: Alert/Oriented, Cranial Nerve II-XII normal, Abnormal Sensory absent LT in Right hand and foot, Abnormal Motor 4/5 on Right side, 5/5 on left and Abnormal FMC Ataxic/ dec FMC RUE and RLE.Absent PP/LT and proprioception on right.  Musc/Skel:  Tenderness to palpation Left rectus abd origin left lower costal margin Gen NAD   Assessment/Plan: 1.  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 Physiatrist is providing close team supervision and 24 hour management of active medical problems listed below. Physiatrist and rehab team continue to assess barriers to discharge/monitor patient progress toward functional and medical goals.  FIM: FIM - Bathing Bathing Steps Patient Completed: Chest, Right Arm, Left Arm, Abdomen, Front perineal area, Buttocks, Right upper leg, Left upper leg Bathing: 4: Min-Patient completes 8-9 20f 10 parts or 75+ percent  FIM - Upper Body Dressing/Undressing Upper body dressing/undressing steps patient completed: Thread/unthread right bra strap, Thread/unthread left bra strap, Hook/unhook bra, Thread/unthread right sleeve of pullover shirt/dresss, Thread/unthread left sleeve of pullover shirt/dress, Put head through opening of pull over shirt/dress, Pull shirt over trunk Upper body dressing/undressing: 4: Min-Patient completed 75 plus % of tasks FIM - Lower Body Dressing/Undressing Lower body dressing/undressing steps patient completed: Thread/unthread right underwear leg, Thread/unthread left underwear leg, Pull underwear up/down, Thread/unthread left pants leg, Pull pants up/down, Don/Doff left shoe, Thread/unthread right pants leg Lower body dressing/undressing: 3: Mod-Patient completed 50-74% of tasks  FIM - Toileting Toileting steps completed by patient: Adjust clothing prior to toileting, Adjust clothing after toileting Toileting Assistive Devices: Grab bar or rail for support Toileting: 3: Mod-Patient completed 2 of 3 steps  FIM - Radio producer Devices: Product manager Transfers: 3-To toilet/BSC: Mod A (lift or lower assist), 3-From toilet/BSC: Mod A (lift or lower assist)  FIM - Control and instrumentation engineer Devices:  Arm rests, Environmental consultant, Orthosis (R hand orthosis) Bed/Chair Transfer: 4: Bed > Chair or W/C: Min A (steadying Pt. > 75%), 4: Chair or W/C > Bed: Min A (steadying Pt. > 75%), 5: Supine > Sit: Supervision (verbal cues/safety issues), 5: Sit > Supine: Supervision (verbal cues/safety issues)  FIM - Locomotion: Wheelchair Distance: 150 Locomotion: Wheelchair: 5: Travels 150 ft or more: maneuvers on rugs and over door sills with supervision, cueing or coaxing FIM - Locomotion: Ambulation Locomotion: Ambulation Assistive Devices: Environmental consultant - Rolling, Orthosis, Other (comment) (R hand orthosis) Ambulation/Gait Assistance: 4: Min assist, 4: Min guard Locomotion: Ambulation: 2: Travels 50 - 149 ft with minimal assistance (Pt.>75%)  Comprehension Comprehension Mode: Auditory Comprehension: 5-Understands complex 90% of the time/Cues < 10% of the time  Expression Expression Mode: Verbal Expression: 5-Expresses complex 90% of the time/cues < 10% of the time  Social Interaction Social Interaction: 7-Interacts appropriately with others - No medications needed.  Problem Solving Problem Solving: 5-Solves basic problems: With no assist  Memory Memory: 5-Recognizes or recalls 90% of the time/requires cueing < 10% of the time  Medical Problem List and Plan: 1. 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 DVT Prophylaxis/Anticoagulation: Lovenox initiated 08/03/2014 for DVT prophylaxis. Monitor platelet counts of any signs of bleeding 3. Pain Management: Tylenol as needed, sensory dysesthesia, monitor may need gabapentin if it worsens 4. COPD.Dulera twice a day. 5. Neuropsych: This patient is capable of making decisions on her own behalf. 6. Skin/Wound Care: Routine skin checks 7. Fluids/Electrolytes/Nutrition: Strict I and O's. Labs nl   8. Hypertension. Lisinopril 10 mg daily. Monitor with increased mobility  LOS (Days) 9 A FACE TO FACE EVALUATION WAS  PERFORMED  KIRSTEINS,ANDREW E 08/12/2014, 7:34 AM

## 2014-08-13 ENCOUNTER — Encounter (HOSPITAL_COMMUNITY): Payer: Medicare HMO | Admitting: Occupational Therapy

## 2014-08-13 ENCOUNTER — Inpatient Hospital Stay (HOSPITAL_COMMUNITY): Payer: Medicare HMO | Admitting: Physical Therapy

## 2014-08-13 ENCOUNTER — Inpatient Hospital Stay (HOSPITAL_COMMUNITY): Payer: Medicare HMO | Admitting: *Deleted

## 2014-08-13 DIAGNOSIS — G8929 Other chronic pain: Secondary | ICD-10-CM

## 2014-08-13 DIAGNOSIS — M542 Cervicalgia: Secondary | ICD-10-CM

## 2014-08-13 LAB — GLUCOSE, CAPILLARY
GLUCOSE-CAPILLARY: 135 mg/dL — AB (ref 70–99)
GLUCOSE-CAPILLARY: 94 mg/dL (ref 70–99)
GLUCOSE-CAPILLARY: 139 mg/dL — AB (ref 70–99)
GLUCOSE-CAPILLARY: 109 mg/dL — AB (ref 70–99)

## 2014-08-13 MED ORDER — MUSCLE RUB 10-15 % EX CREA
TOPICAL_CREAM | Freq: Two times a day (BID) | CUTANEOUS | Status: DC | PRN
  Administered 2014-08-14: 03:00:00 via TOPICAL
  Filled 2014-08-13: qty 85

## 2014-08-13 MED ORDER — POLYVINYL ALCOHOL 1.4 % OP SOLN
2.0000 [drp] | OPHTHALMIC | Status: DC | PRN
  Administered 2014-08-13 – 2014-08-15 (×4): 2 [drp] via OPHTHALMIC
  Filled 2014-08-13: qty 15

## 2014-08-13 NOTE — Progress Notes (Signed)
Occupational Therapy Session Note  Patient Details  Name: Sherry Lambert MRN: [id number] Date of Birth: 09-Sep-1940  Today's Date: 08/13/2014 OT Individual Time: 0930-1030 OT Individual Time Calculation (min): 60 min   Short Term Goals: Week 1:  OT Short Term Goal 1 (Week 1): Pt will perform grooming while standing at sink side with Min A balance during self care task. OT Short Term Goal 1 - Progress (Week 1): Met OT Short Term Goal 2 (Week 1): Pt will perform shower transfer with Mod A in order to decrease assistance with transfers. OT Short Term Goal 2 - Progress (Week 1): Met OT Short Term Goal 3 (Week 1): Pt will perform LB dressing with Mod A in order to increase I with self care. OT Short Term Goal 3 - Progress (Week 1): Met OT Short Term Goal 4 (Week 1): Pt will perform UB dressing with set up A in order to increase I with self care task. OT Short Term Goal 4 - Progress (Week 1): Progressing toward goal Week 2:  OT Short Term Goal 1 (Week 2): Pt will perform UB dressing with supervision (fastening bra in lap and don over head)  OT Short Term Goal 2 (Week 2): Pt will complete LB dressing with min assist OT Short Term Goal 3 (Week 2): Pt will complete toilet transfer with min assist OT Short Term Goal 4 (Week 2): Pt will complete shower transfer with min assist OT Short Term Goal 5 (Week 2): Pt will complete simpe meal prep with min assist, utilizing RUE as gross assist  Skilled Therapeutic Interventions/Progress Updates:  Patient received seated in w/c ready for ADL. Patient with no complaints of pain. Patient propelled self > dresser to pick out clothes for today. From here, patient stood with RW and ambulated > BR for shower stall transfer. Patient completed UB/LB bathing in sit<>stand position from shower chair. Patient then dried and ambulated back to w/c for grooming tasks while seated at sink and UB/LB dressing in sit<>stand position. Therapist educated patient on safest and most  effective way to perform sit<>stands and encouraged patient to use vision to assist with RUE tasks secondary to decreased sensation and decreased coordination > RUE. At end of session, left patient seated in w/c with all needs within reach.   Precautions:  Precautions Precautions: Fall Precaution Comments: impulsive, R inattention Restrictions Weight Bearing Restrictions: No  See FIM for current functional status  Therapy/Group: Individual Therapy  Marcelo Ickes 08/13/2014, 10:55 AM

## 2014-08-13 NOTE — Progress Notes (Signed)
Addendum. Pt also states that her neck felt like it had a "crick " in it.Pt says this too has been going on for over a year.Asked if perhaps she slept on it wrong and again pt says she has had it for a year.wbb

## 2014-08-13 NOTE — Progress Notes (Signed)
Nursing Note: Pt called for her nurse and complained that she has been having discomfort in her L side and L breast for a year now.Pt states she had a mammogram in April or March and that it was fine.Pt denies pain,she states at times it makes her feel SOB.She denies it at this time. PO2 97% on r/a.Lungs sounds are diminished but this is not new for pt.She states that she knows that something is wrong even though it has been like this for over a year.Left a note for MD.She says she has told the Doctor already.wbb

## 2014-08-13 NOTE — Plan of Care (Signed)
Problem: RH SKIN INTEGRITY Goal: RH STG MAINTAIN SKIN INTEGRITY WITH ASSISTANCE STG Maintain Skin Integrity With [facility].  Outcome: Progressing

## 2014-08-13 NOTE — Plan of Care (Signed)
Problem: RH BOWEL ELIMINATION Goal: RH STG MANAGE BOWEL WITH ASSISTANCE STG Manage Bowel with Mod I Assistance.  Outcome: Progressing

## 2014-08-13 NOTE — Progress Notes (Signed)
Physical Therapy Session Note  Patient Details  Name: Sherry Lambert MRN: [id number] Date of Birth: April 17, 1941  Today's Date: 08/13/2014 PT Individual Time: 1102-1203 and 6270-3500 PT Individual Time Calculation (min): 61 min and 64 min  Short Term Goals: Week 2:  PT Short Term Goal 1 (Week 2): STG's = LTG's secondary to anticipated LOS  Skilled Therapeutic Interventions/Progress Updates:    Treatment Session 1: Pt received seated in w/c; agreeable to therapy and reporting no pain in R knee at this time. Pt performed gait x200' then x180' in controlled environment with rolling walker and close supervision to min A secondary to R toe catch x3 instances. Cueing focused on upright posture, R heel strike, and activation of R hip extensors during RLE stance phase. Pt reporting onset of R knee pain with R midstance during ambulation which resolved when ambulation trial ended. R genu recurvatum and pain during RLE midstance. With close observation, noted R genu recurvatum in R midstance with accompanying pain. Pt reported some decrease in pain with addition of R heel; however, pt continueing to intermittently demonstrate R recurvatum. Donned ACE bandage at R knee to prevent recurvatum. During subsequent ambulation trial, pt with no instances of recurvatum and no pain. Transitioned to NuStep for RUE/LE NMR with mod verbal cueing focused on functional utilization, forced use of RUE; verbal/tactile cueing focused on activation of R hamstrings for control of R knee extension. Session ended in pt room, where pt was left seated in w/c with all needs within reach.  Treatment Session 2: Pt received seated in w/c; agreeable to therapy. Session focused on continued gait training with emphasis on motor control in R knee. Pt performed gait x170, x150, and with rolling walker, R hand orthosis, and close supervision to min A, and cueing as described in AM session. Donned blue Theraband wrap at RLE to increase  proprioceptive input at RLE, promote R ankle dorsiflexion and increased R knee flexion (to prevent R genu recurvatum) and performed multiple short-distance ambulation trials with verbal/tactile cueing for increased hip extension, tactile cueing at distal R hamstrings.In rehab apartment, pt performed multiple sit<>stand transfers with mod verbal cueing for LUE management of RUE with within-session carryover during 50% of remaining transfers. Pt reporting no pain in R knee during this session. Session ended in pt room, where pt was left seated in w/c with all needs within reach.  Therapy Documentation Precautions:  Precautions Precautions: Fall Precaution Comments: impulsive, R inattention Restrictions Weight Bearing Restrictions: No General:   Vital Signs: Therapy Vitals Temp: 98.4 F (36.9 C) Temp Source: Oral Pulse Rate: 87 Resp: 17 BP: (!) 139/56 mmHg Patient Position (if appropriate): Sitting Oxygen Therapy SpO2: 96 % O2 Device: Not Delivered Pain: AM session: Pain Assessment Pain Assessment: 0-10 Pain Score: 2  Pain Type: Acute pain Pain Location: Knee Pain Orientation: Right;Anterior Pain Descriptors / Indicators: Aching Pain Onset: Other (Comment) (With ambulation) Pain Intervention(s): Other (Comment) (R heel lift; ACE bandage to prevent R genu recurvatum) PM session: pt denies pain Locomotion : Ambulation Ambulation/Gait Assistance: 5: Supervision;4: Min guard;4: Min assist  NMR: Focused on increasing motor control in R knee with dynamic standing. Standing with bilat UE support at rolling walker and mirror positioned anterolateral to pt for increased visual feedback, pt performed partial squats with tactile cueing at R hamstrings during R knee extension to prevent knee hyperextension.  See FIM for current functional status  Therapy/Group: Individual Therapy  Hobble, Malva Cogan 08/13/2014, 3:58 PM

## 2014-08-13 NOTE — Plan of Care (Signed)
Problem: RH BOWEL ELIMINATION Goal: RH STG MANAGE BOWEL W/MEDICATION W/ASSISTANCE STG Manage Bowel with Medication with Mod I Assistance.  Outcome: Progressing

## 2014-08-13 NOTE — Progress Notes (Signed)
Subjective/Complaints: 73 y.o. female with hypertension, peripheral vascular disease, diabetes mellitus, peripheral arterial disease and COPD experienced acute onset of weakness and numbness involving the right side on 07/28/2014. She had been taking aspirin daily. 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 MRI showed acute subacute left thalamic hemorrhage. Carotid Dopplers no ICA stenosis.Echocardiogram ejection fraction 78% grade 1 diastolic dysfunction. Blood pressure was elevated requiring immediate intervention with labetalol IV. NIH stroke score was 5  Left sided neck pain and upper chest, pain with neck ROM, no exertional symptoms  Review of Systems - Negative except as above Objective: Vital Signs: Blood pressure 120/56, pulse 80, temperature 97.9 F (36.6 C), temperature source Oral, resp. rate 20, height 5' (1.524 m), weight 63.05 kg (139 lb), SpO2 97 %. No results found. Results for orders placed or performed during the hospital encounter of 08/03/14 (from the past 72 hour(s))  Glucose, capillary     Status: Abnormal   Collection Time: 08/10/14 11:42 AM  Result Value Ref Range   Glucose-Capillary 151 (H) 70 - 99 mg/dL  Glucose, capillary     Status: Abnormal   Collection Time: 08/10/14  4:43 PM  Result Value Ref Range   Glucose-Capillary 109 (H) 70 - 99 mg/dL  Glucose, capillary     Status: Abnormal   Collection Time: 08/10/14  9:26 PM  Result Value Ref Range   Glucose-Capillary 121 (H) 70 - 99 mg/dL   Comment 1 Notify RN   Glucose, capillary     Status: Abnormal   Collection Time: 08/11/14  6:46 AM  Result Value Ref Range   Glucose-Capillary 111 (H) 70 - 99 mg/dL  Glucose, capillary     Status: Abnormal   Collection Time: 08/11/14 11:31 AM  Result Value Ref Range   Glucose-Capillary 138 (H) 70 - 99 mg/dL  Glucose, capillary     Status: Abnormal   Collection Time: 08/11/14  4:40 PM  Result Value Ref Range   Glucose-Capillary 107 (H) 70 - 99 mg/dL  Glucose, capillary     Status: Abnormal   Collection Time: 08/11/14  9:04 PM  Result Value Ref Range   Glucose-Capillary 122 (H) 70 - 99 mg/dL  Glucose, capillary     Status: Abnormal   Collection Time: 08/12/14  6:49 AM  Result Value Ref Range   Glucose-Capillary 135 (H) 70 - 99 mg/dL  Glucose, capillary     Status: Abnormal   Collection Time: 08/12/14 12:11 PM  Result Value Ref Range   Glucose-Capillary 100 (H) 70 - 99 mg/dL  Glucose, capillary     Status: Abnormal   Collection Time: 08/12/14  4:20 PM  Result Value Ref Range   Glucose-Capillary 207 (H) 70 - 99 mg/dL  Glucose, capillary     Status: None   Collection Time: 08/12/14  9:05 PM  Result Value Ref Range   Glucose-Capillary 97 70 - 99 mg/dL  Glucose, capillary     Status: Abnormal   Collection Time: 08/13/14  6:42 AM  Result Value Ref Range   Glucose-Capillary 135 (H) 70 - 99 mg/dL     HEENT: normal Cardio: RRR and no murmur Resp: CTA B/L and unlabored GI: BS positive and NT,ND Extremity:  No Edema Skin:   Intact Neuro: Alert/Oriented, Cranial Nerve II-XII normal, Abnormal Sensory absent LT in Right hand and foot, Abnormal Motor 4/5 on Right side, 5/5 on left and Abnormal [facility] Ataxic/  dec [facility] RUE and RLE.Absent PP/LT and proprioception on right.  Musc/Skel:  Tenderness to palpation Left rectus abd origin left lower costal margin, negative impingement signs on left Full PROM with out pain Left shoulder Gen NAD   Assessment/Plan: 1. 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 Physiatrist is providing close team supervision and 24 hour management of active medical problems listed below. Physiatrist and rehab team continue to assess barriers to discharge/monitor patient progress  toward functional and medical goals.  FIM: FIM - Bathing Bathing Steps Patient Completed: Chest, Right Arm, Left Arm, Abdomen, Front perineal area, Buttocks, Right upper leg, Left upper leg Bathing: 4: Min-Patient completes 8-9 84f 10 parts or 75+ percent  FIM - Upper Body Dressing/Undressing Upper body dressing/undressing steps patient completed: Thread/unthread right bra strap, Thread/unthread left bra strap, Hook/unhook bra, Thread/unthread right sleeve of pullover shirt/dresss, Thread/unthread left sleeve of pullover shirt/dress, Put head through opening of pull over shirt/dress, Pull shirt over trunk Upper body dressing/undressing: 4: Min-Patient completed 75 plus % of tasks FIM - Lower Body Dressing/Undressing Lower body dressing/undressing steps patient completed: Thread/unthread right underwear leg, Thread/unthread left underwear leg, Pull underwear up/down, Thread/unthread left pants leg, Pull pants up/down, Don/Doff left shoe, Thread/unthread right pants leg Lower body dressing/undressing: 3: Mod-Patient completed 50-74% of tasks  FIM - Toileting Toileting steps completed by patient: Adjust clothing prior to toileting, Adjust clothing after toileting Toileting Assistive Devices: Grab bar or rail for support Toileting: 3: Mod-Patient completed 2 of 3 steps  FIM - Radio producer Devices: Grab bars Toilet Transfers: 3-To toilet/BSC: Mod A (lift or lower assist), 3-From toilet/BSC: Mod A (lift or lower assist)  FIM - Control and instrumentation engineer Devices: Arm rests, Environmental consultant, Orthosis (R hand orthosis) Bed/Chair Transfer: 4: Bed > Chair or W/C: Min A (steadying Pt. > 75%), 4: Chair or W/C > Bed: Min A (steadying Pt. > 75%), 5: Supine > Sit: Supervision (verbal cues/safety issues), 5: Sit > Supine: Supervision (verbal cues/safety issues)  FIM - Locomotion: Wheelchair Distance: 150 Locomotion: Wheelchair: 0: Activity did not occur (Pt  ambulatory on unit) FIM - Locomotion: Ambulation Locomotion: Ambulation Assistive Devices: Walker - Rolling, Orthosis, Other (comment) (R hand orthosis) Ambulation/Gait Assistance: 5: Supervision, 4: Min guard, 4: Min assist Locomotion: Ambulation: 2: Travels 50 - 149 ft with minimal assistance (Pt.>75%)  Comprehension Comprehension Mode: Auditory Comprehension: 5-Understands complex 90% of the time/Cues < 10% of the time  Expression Expression Mode: Verbal Expression: 5-Expresses complex 90% of the time/cues < 10% of the time  Social Interaction Social Interaction: 7-Interacts appropriately with others - No medications needed.  Problem Solving Problem Solving: 5-Solves basic problems: With no assist  Memory Memory: 5-Recognizes or recalls 90% of the time/requires cueing < 10% of the time  Medical Problem List and Plan: 1. 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 DVT Prophylaxis/Anticoagulation: Lovenox initiated 08/03/2014 for DVT prophylaxis. Monitor platelet counts of any signs of bleeding 3. Pain Management: Tylenol as needed, Left sided neck pain degenerative with decreased mobility, no clearcut radicular symptoms but may radiate to  Shoulder  and chest area will add sportscreme and K pad 4. COPD.Dulera twice a day. 5. Neuropsych: This patient is capable of making decisions on her own behalf. 6. Skin/Wound Care: Routine skin checks 7. Fluids/Electrolytes/Nutrition: Strict I and O's. Labs nl   8. Hypertension. Lisinopril 10  mg daily. Monitor with increased mobility  LOS (Days) 10 A FACE TO FACE EVALUATION WAS PERFORMED  Zohan Shiflet E 08/13/2014, 7:50 AM

## 2014-08-14 ENCOUNTER — Encounter (HOSPITAL_COMMUNITY): Payer: Medicare HMO

## 2014-08-14 ENCOUNTER — Inpatient Hospital Stay (HOSPITAL_COMMUNITY): Payer: Medicare HMO | Admitting: Physical Therapy

## 2014-08-14 LAB — GLUCOSE, CAPILLARY
Glucose-Capillary: 121 mg/dL — ABNORMAL HIGH (ref 70–99)
Glucose-Capillary: 97 mg/dL (ref 70–99)
Glucose-Capillary: 134 mg/dL — ABNORMAL HIGH (ref 70–99)
Glucose-Capillary: 129 mg/dL — ABNORMAL HIGH (ref 70–99)

## 2014-08-14 NOTE — Progress Notes (Signed)
Occupational Therapy Session Note  Patient Details  Name: Sherry Lambert MRN: [id number] Date of Birth: 1941-03-25  Today's Date: 08/14/2014 OT Individual Time: 0900-1000 OT Individual Time Calculation (min): 60 min    Short Term Goals: Week 2:  OT Short Term Goal 1 (Week 2): Pt will perform UB dressing with supervision (fastening bra in lap and don over head)  OT Short Term Goal 2 (Week 2): Pt will complete LB dressing with min assist OT Short Term Goal 3 (Week 2): Pt will complete toilet transfer with min assist OT Short Term Goal 4 (Week 2): Pt will complete shower transfer with min assist OT Short Term Goal 5 (Week 2): Pt will complete simpe meal prep with min assist, utilizing RUE as gross assist  Skilled Therapeutic Interventions/Progress Updates:    Pt engaged in BADL retraining including bathing and dressing with sit<>stand from w/c at sink.  Pt declined shower this morning.  Pt amb with RW to gather clothing prior to engaging in bathing tasks.  Pt required max verbal cues to attend to RUE when not engaged in functional task.  Pt initiates use of RUE in all functional tasks but requires verbal cues to look at RUE to increase success in task.  Focus on functional amb with RW, sit<>stand, standing balance, attention to right, attention to RUE, and safety awareness.  Therapy Documentation Precautions:  Precautions Precautions: Fall Precaution Comments: impulsive, R inattention Restrictions Weight Bearing Restrictions: No   Pain:  Pt denied pain  See FIM for current functional status  Therapy/Group: Individual Therapy  Leroy Libman 08/14/2014, 10:02 AM

## 2014-08-14 NOTE — Progress Notes (Signed)
Subjective/Complaints: 73 y.o. female with hypertension, peripheral vascular disease, diabetes mellitus, peripheral arterial disease and COPD experienced acute onset of weakness and numbness involving the right side on 07/28/2014. She had been taking aspirin daily. 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 MRI showed acute subacute left thalamic hemorrhage. Carotid Dopplers no ICA stenosis.Echocardiogram ejection fraction 41% grade 1 diastolic dysfunction. Blood pressure was elevated requiring immediate intervention with labetalol IV. NIH stroke score was 5  Some itching, started some months ago Family member told me my right eye looked funny, using some eye drops now , feels better  Review of Systems - Negative except as above Objective: Vital Signs: Blood pressure 135/62, pulse 88, temperature 98.1 F (36.7 C), temperature source Oral, resp. rate 18, height 5' (1.524 m), weight 63.05 kg (139 lb), SpO2 98 %. No results found. Results for orders placed or performed during the hospital encounter of 08/03/14 (from the past 72 hour(s))  Glucose, capillary     Status: Abnormal   Collection Time: 08/11/14 11:31 AM  Result Value Ref Range   Glucose-Capillary 138 (H) 70 - 99 mg/dL  Glucose, capillary     Status: Abnormal   Collection Time: 08/11/14  4:40 PM  Result Value Ref Range   Glucose-Capillary 107 (H) 70 - 99 mg/dL  Glucose, capillary     Status: Abnormal   Collection Time: 08/11/14  9:04 PM  Result Value Ref Range   Glucose-Capillary 122 (H) 70 - 99 mg/dL  Glucose, capillary     Status: Abnormal   Collection Time: 08/12/14  6:49 AM  Result Value Ref Range   Glucose-Capillary 135 (H) 70 - 99 mg/dL  Glucose, capillary     Status: Abnormal   Collection Time: 08/12/14 12:11 PM  Result Value Ref Range   Glucose-Capillary 100 (H) 70 - 99 mg/dL  Glucose, capillary      Status: Abnormal   Collection Time: 08/12/14  4:20 PM  Result Value Ref Range   Glucose-Capillary 207 (H) 70 - 99 mg/dL  Glucose, capillary     Status: None   Collection Time: 08/12/14  9:05 PM  Result Value Ref Range   Glucose-Capillary 97 70 - 99 mg/dL  Glucose, capillary     Status: Abnormal   Collection Time: 08/13/14  6:42 AM  Result Value Ref Range   Glucose-Capillary 135 (H) 70 - 99 mg/dL  Glucose, capillary     Status: None   Collection Time: 08/13/14 12:03 PM  Result Value Ref Range   Glucose-Capillary 94 70 - 99 mg/dL   Comment 1 Notify RN   Glucose, capillary     Status: Abnormal   Collection Time: 08/13/14  4:22 PM  Result Value Ref Range   Glucose-Capillary 139 (H) 70 - 99 mg/dL   Comment 1 Notify RN   Glucose, capillary     Status: Abnormal   Collection Time: 08/13/14  8:40 PM  Result Value Ref Range   Glucose-Capillary 109 (H) 70 - 99 mg/dL  Glucose, capillary     Status: Abnormal   Collection Time: 08/14/14  6:58 AM  Result Value Ref Range   Glucose-Capillary 121 (H) 70 - 99 mg/dL     HEENT: normal, eyes non injected no icterus, EOMI, no nystagmus Cardio: RRR and no murmur Resp: CTA B/L and unlabored GI: BS positive and NT,ND Extremity:  No Edema Skin:   Intact, no evidence of rash  Neuro: Alert/Oriented, Cranial Nerve II-XII normal, Abnormal Sensory absent LT in Right hand and foot, Abnormal Motor 4/5 on Right side, 5/5 on left and Abnormal [facility] Ataxic/ dec [facility] RUE and RLE.Absent PP/LT and proprioception on right.  Musc/Skel:  Tenderness to palpation Left rectus abd origin left lower costal margin, negative impingement signs on left Full PROM with out pain Left shoulder Gen NAD   Assessment/Plan: 1. 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 Physiatrist is providing close team supervision and 24 hour management of  active medical problems listed below. Physiatrist and rehab team continue to assess barriers to discharge/monitor patient progress toward functional and medical goals.  FIM: FIM - Bathing Bathing Steps Patient Completed: Chest, Right Arm, Left Arm, Abdomen, Front perineal area, Buttocks, Right upper leg, Left upper leg Bathing: 4: Min-Patient completes 8-9 51f 10 parts or 75+ percent  FIM - Upper Body Dressing/Undressing Upper body dressing/undressing steps patient completed: Thread/unthread right sleeve of pullover shirt/dresss, Thread/unthread left sleeve of pullover shirt/dress, Put head through opening of pull over shirt/dress, Pull shirt over trunk Upper body dressing/undressing: 5: Supervision: Safety issues/verbal cues FIM - Lower Body Dressing/Undressing Lower body dressing/undressing steps patient completed: Thread/unthread right underwear leg, Thread/unthread left underwear leg, Pull underwear up/down, Thread/unthread left pants leg, Pull pants up/down, Don/Doff left shoe, Thread/unthread right pants leg, Don/Doff right shoe Lower body dressing/undressing: 3: Mod-Patient completed 50-74% of tasks  FIM - Toileting Toileting steps completed by patient: Adjust clothing prior to toileting, Adjust clothing after toileting Toileting Assistive Devices: Grab bar or rail for support Toileting: 0: Activity did not occur  FIM - Radio producer Devices: Product manager Transfers: 0-Activity did not occur  FIM - Control and instrumentation engineer Devices: Arm rests, Copy: 5: Bed > Chair or W/C: Supervision (verbal cues/safety issues), 5: Chair or W/C > Bed: Supervision (verbal cues/safety issues) (close supervision)  FIM - Locomotion: Wheelchair Distance: 150 Locomotion: Wheelchair: 0: Activity did not occur FIM - Locomotion: Ambulation Locomotion: Ambulation Assistive Devices: Environmental consultant - Rolling, Other (comment) (R heel lift;  ACE bandage to prevent R genu recurvatum) Ambulation/Gait Assistance: 5: Supervision, 4: Min guard, 4: Min assist Locomotion: Ambulation: 4: Travels 150 ft or more with minimal assistance (Pt.>75%)  Comprehension Comprehension Mode: Auditory Comprehension: 5-Understands complex 90% of the time/Cues < 10% of the time  Expression Expression Mode: Verbal Expression: 5-Expresses complex 90% of the time/cues < 10% of the time  Social Interaction Social Interaction: 6-Interacts appropriately with others with medication or extra time (anti-anxiety, antidepressant).  Problem Solving Problem Solving: 5-Solves basic problems: With no assist  Memory Memory: 5-Recognizes or recalls 90% of the time/requires cueing < 10% of the time  Medical Problem List and Plan: 1. 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 DVT Prophylaxis/Anticoagulation: Lovenox initiated 08/03/2014 for DVT prophylaxis. Monitor platelet counts of any signs of bleeding 3. Pain Management: Tylenol as needed, Left sided neck pain degenerative with decreased mobility, no clearcut radicular symptoms but may radiate to  Shoulder  and chest area will add sportscreme and K pad 4. COPD.Dulera twice a day. 5. Neuropsych: This patient is capable of making decisions on her own behalf. 6. Skin/Wound Care: Routine skin checks 7. Fluids/Electrolytes/Nutrition: Strict I and O's. Labs nl   8. Hypertension. Lisinopril 10 mg daily. Monitor with increased mobility  LOS (Days) 11 A FACE TO  FACE EVALUATION WAS PERFORMED  KIRSTEINS,ANDREW E 08/14/2014, 8:21 AM

## 2014-08-14 NOTE — Progress Notes (Signed)
Physical Therapy Session Note  Patient Details  Name: Sherry Lambert MRN: [id number] Date of Birth: 1941-09-02  Today's Date: 08/14/2014 PT Individual Time: 1007-1037 PT Individual Time Calculation (min): 30 min   Short Term Goals: Week 2:  PT Short Term Goal 1 (Week 2): STG's = LTG's secondary to anticipated LOS  Skilled Therapeutic Interventions/Progress Updates:  Pt received seated in w/c; agreeable to therapy. Session focused on increasing motor control in R knee to improve gait stability. Pt performed gait x150' in controlled environment with rolling walker, R hand orthosis, R heel lift (to prevent genu recurvatum), and close supervison to min A; verbal cueing focused on increased LLE step length, bilat hip extension; tactile cueing at R hamstrings for knee control in midstance. Standing with bilat UE support at hallway rail and mirror on R side for visual feedback of R knee position, pt performed 1/4 squat with concurrent lateral weight shifting to increase R knee control. Transitioned to blocked practice of 1/2 squats with RUE support, mirror to R of pt as described; tactile cueing at R hamstrings to increase activation, knee control. Performed w/c mobility x40' using RLE only for increased strength in R hamstrings; tactile cueing at R knee for increased weightbearing. Session ended in pt room, where pt was left seated in w/c with all needs within reach.  Therapy Documentation Precautions:  Precautions Precautions: Fall Precaution Comments: impulsive, R inattention Restrictions Weight Bearing Restrictions: No Pain: Pain Assessment Pain Assessment: No/denies pain Locomotion : Ambulation Ambulation/Gait Assistance: 5: Supervision;4: Min guard;4: Min assist Wheelchair Mobility Distance: 40   See FIM for current functional status  Therapy/Group: Individual Therapy  Denisa Enterline, Malva Cogan 08/14/2014, 12:52 PM

## 2014-08-15 ENCOUNTER — Inpatient Hospital Stay (HOSPITAL_COMMUNITY): Payer: Medicare HMO

## 2014-08-15 LAB — GLUCOSE, CAPILLARY
GLUCOSE-CAPILLARY: 118 mg/dL — AB (ref 70–99)
GLUCOSE-CAPILLARY: 96 mg/dL (ref 70–99)
GLUCOSE-CAPILLARY: 136 mg/dL — AB (ref 70–99)
Glucose-Capillary: 160 mg/dL — ABNORMAL HIGH (ref 70–99)

## 2014-08-15 NOTE — Progress Notes (Signed)
Subjective/Complaints: 73 y.o. female with hypertension, peripheral vascular disease, diabetes mellitus, peripheral arterial disease and COPD experienced acute onset of weakness and numbness involving the right side on 07/28/2014. She had been taking aspirin daily. 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 MRI showed acute subacute left thalamic hemorrhage. Carotid Dopplers no ICA stenosis.Echocardiogram ejection fraction 41% grade 1 diastolic dysfunction. Blood pressure was elevated requiring immediate intervention with labetalol IV. NIH stroke score was 5  No issues overnite, no eye complaints  Review of Systems - Negative except as above Objective: Vital Signs: Blood pressure 132/70, pulse 86, temperature 97.8 F (36.6 C), temperature source Oral, resp. rate 16, height 5' (1.524 m), weight 63.05 kg (139 lb), SpO2 96 %. No results found. Results for orders placed or performed during the hospital encounter of 08/03/14 (from the past 72 hour(s))  Glucose, capillary     Status: Abnormal   Collection Time: 08/12/14 12:11 PM  Result Value Ref Range   Glucose-Capillary 100 (H) 70 - 99 mg/dL  Glucose, capillary     Status: Abnormal   Collection Time: 08/12/14  4:20 PM  Result Value Ref Range   Glucose-Capillary 207 (H) 70 - 99 mg/dL  Glucose, capillary     Status: None   Collection Time: 08/12/14  9:05 PM  Result Value Ref Range   Glucose-Capillary 97 70 - 99 mg/dL  Glucose, capillary     Status: Abnormal   Collection Time: 08/13/14  6:42 AM  Result Value Ref Range   Glucose-Capillary 135 (H) 70 - 99 mg/dL  Glucose, capillary     Status: None   Collection Time: 08/13/14 12:03 PM  Result Value Ref Range   Glucose-Capillary 94 70 - 99 mg/dL   Comment 1 Notify RN   Glucose, capillary     Status: Abnormal   Collection Time: 08/13/14  4:22 PM  Result Value Ref Range   Glucose-Capillary 139 (H) 70 - 99 mg/dL   Comment 1 Notify RN   Glucose, capillary     Status: Abnormal   Collection Time: 08/13/14  8:40 PM  Result Value Ref Range   Glucose-Capillary 109 (H) 70 - 99 mg/dL  Glucose, capillary     Status: Abnormal   Collection Time: 08/14/14  6:58 AM  Result Value Ref Range   Glucose-Capillary 121 (H) 70 - 99 mg/dL  Glucose, capillary     Status: None   Collection Time: 08/14/14 11:23 AM  Result Value Ref Range   Glucose-Capillary 97 70 - 99 mg/dL   Comment 1 Notify RN   Glucose, capillary     Status: Abnormal   Collection Time: 08/14/14  4:24 PM  Result Value Ref Range   Glucose-Capillary 134 (H) 70 - 99 mg/dL   Comment 1 Notify RN   Glucose, capillary     Status: Abnormal   Collection Time: 08/14/14  9:15 PM  Result Value Ref Range   Glucose-Capillary 129 (H) 70 - 99 mg/dL  Glucose, capillary     Status: Abnormal   Collection Time: 08/15/14  7:07 AM  Result Value Ref Range   Glucose-Capillary 118 (H) 70 - 99 mg/dL   Comment 1 Notify RN      HEENT: normal, eyes non injected no icterus, EOMI, no nystagmus Cardio: RRR and no murmur Resp: CTA B/L and unlabored GI: BS positive and NT,ND Extremity:  No Edema Skin:   Intact, no evidence  of rash Neuro: Alert/Oriented, Cranial Nerve II-XII normal, Abnormal Sensory absent LT in Right hand and foot, Abnormal Motor 4/5 on Right side, 5/5 on left and Abnormal FMC Ataxic/ dec FMC RUE and RLE.Absent PP/LT and proprioception on right.  Musc/Skel:  Tenderness to palpation Left rectus abd origin left lower costal margin, negative impingement signs on left Full PROM with out pain Left shoulder Gen NAD   Assessment/Plan: 1. 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 Physiatrist is providing close team supervision and 24 hour management of active medical problems  listed below. Physiatrist and rehab team continue to assess barriers to discharge/monitor patient progress toward functional and medical goals.  FIM: FIM - Bathing Bathing Steps Patient Completed: Chest, Right Arm, Left Arm, Abdomen, Front perineal area, Buttocks, Left lower leg (including foot), Right lower leg (including foot), Left upper leg, Right upper leg Bathing: 4: Steadying assist  FIM - Upper Body Dressing/Undressing Upper body dressing/undressing steps patient completed: Thread/unthread right sleeve of pullover shirt/dresss, Thread/unthread left sleeve of pullover shirt/dress, Put head through opening of pull over shirt/dress, Pull shirt over trunk Upper body dressing/undressing: 5: Supervision: Safety issues/verbal cues FIM - Lower Body Dressing/Undressing Lower body dressing/undressing steps patient completed: Thread/unthread right underwear leg, Thread/unthread left underwear leg, Pull underwear up/down, Thread/unthread right pants leg, Thread/unthread left pants leg, Pull pants up/down, Don/Doff left shoe Lower body dressing/undressing: 3: Mod-Patient completed 50-74% of tasks  FIM - Toileting Toileting steps completed by patient: Adjust clothing prior to toileting, Adjust clothing after toileting Toileting Assistive Devices: Grab bar or rail for support Toileting: 0: Activity did not occur  FIM - Radio producer Devices: Product manager Transfers: 0-Activity did not occur  FIM - Control and instrumentation engineer Devices: Copy: 4: Bed > Chair or W/C: Min A (steadying Pt. > 75%), 4: Chair or W/C > Bed: Min A (steadying Pt. > 75%)  FIM - Locomotion: Wheelchair Distance: 40 Locomotion: Wheelchair: 1: Travels less than 50 ft with supervision, cueing or coaxing FIM - Locomotion: Ambulation Locomotion: Ambulation Assistive Devices: Administrator Ambulation/Gait Assistance: 5: Supervision, 4: Min guard, 4: Min  assist Locomotion: Ambulation: 4: Travels 150 ft or more with minimal assistance (Pt.>75%)  Comprehension Comprehension Mode: Auditory Comprehension: 5-Understands complex 90% of the time/Cues < 10% of the time  Expression Expression Mode: Verbal Expression: 5-Expresses complex 90% of the time/cues < 10% of the time  Social Interaction Social Interaction: 6-Interacts appropriately with others with medication or extra time (anti-anxiety, antidepressant).  Problem Solving Problem Solving: 5-Solves complex 90% of the time/cues < 10% of the time  Memory Memory: 5-Recognizes or recalls 90% of the time/requires cueing < 10% of the time  Medical Problem List and Plan: 1. 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 DVT Prophylaxis/Anticoagulation: Lovenox initiated 08/03/2014 for DVT prophylaxis. Monitor platelet counts of any signs of bleeding 3. Pain Management: Tylenol as needed, Left sided neck pain degenerative with decreased mobility, no clearcut radicular symptoms but may radiate to  Shoulder  and chest area will add sportscreme and K pad 4. COPD.Dulera twice a day. 5. Neuropsych: This patient is capable of making decisions on her own behalf. 6. Skin/Wound Care: Routine skin checks 7. Fluids/Electrolytes/Nutrition: Strict I and O's. Labs nl   8. Hypertension. Lisinopril 10 mg daily. Monitor with increased mobility  LOS (Days) 12 A FACE  TO FACE EVALUATION WAS PERFORMED  Amen Staszak E 08/15/2014, 8:50 AM

## 2014-08-15 NOTE — Progress Notes (Signed)
Physical Therapy Session Note  Patient Details  Name: Sherry Lambert MRN: [id number] Date of Birth: [birth year]  Today's Date: 08/15/2014 PT Individual Time: 9507-2257 PT Individual Time Calculation (min): 30 min   Skilled Therapeutic Interventions/Progress Updates:   Session focused on gait training with RW in controlled setting and through obstacle course to simulate home environment mobility. Pt generally a bit impulsive and requiring frequent cueing for safety (forgetting to lock brakes, letting R arm fall off side of w/c, placement of RW, etc). Pt navigated through cones for turning as well as stepping over simulated thresholds with steady to min A requiring cues to push RW continuously for step-though gait pattern and stay inside RW. Multiple episodes of significant toe drag on R requiring heavy min A from therapist to prevent fall and pt aware but did not stop or slow down to correct each time it occurred. Attempted removal of heel wedge to see baseline gait as pt reports this was not happening before today in therapy and only mild recurvatum noted. Looked for small enough AFO (toe-off) for trial but could not find one. Will discuss with primary PT as this may be beneficial based off of short gait trial session today if able to locate correct size. Replaced heel wedge to continue until further assessment made.   Therapy Documentation Precautions:  Precautions Precautions: Fall Precaution Comments: impulsive, R inattention Restrictions Weight Bearing Restrictions: No  Pain:  No complaints.  Locomotion : Ambulation Ambulation/Gait Assistance: 4: Min guard;4: Min assist   See FIM for current functional status  Therapy/Group: Individual Therapy  Canary Brim [facility] 08/15/2014, 3:42 PM

## 2014-08-16 ENCOUNTER — Inpatient Hospital Stay (HOSPITAL_COMMUNITY): Payer: Medicare HMO | Admitting: Physical Therapy

## 2014-08-16 ENCOUNTER — Inpatient Hospital Stay (HOSPITAL_COMMUNITY): Payer: Medicare HMO | Admitting: Occupational Therapy

## 2014-08-16 ENCOUNTER — Inpatient Hospital Stay (HOSPITAL_COMMUNITY): Payer: Medicare HMO

## 2014-08-16 LAB — GLUCOSE, CAPILLARY
GLUCOSE-CAPILLARY: 116 mg/dL — AB (ref 70–99)
GLUCOSE-CAPILLARY: 87 mg/dL (ref 70–99)
GLUCOSE-CAPILLARY: 131 mg/dL — AB (ref 70–99)
Glucose-Capillary: 96 mg/dL (ref 70–99)

## 2014-08-16 NOTE — Progress Notes (Signed)
Occupational Therapy Session Note  Patient Details  Name: Sherry Lambert MRN: [id number] Date of Birth: [birth year]  Today's Date: 08/16/2014 OT Individual Time: 9381-8299 and 3716-9678 OT Individual Time Calculation (min): 60 min and 30 min   Short Term Goals: Week 2:  OT Short Term Goal 1 (Week 2): Pt will perform UB dressing with supervision (fastening bra in lap and don over head)  OT Short Term Goal 2 (Week 2): Pt will complete LB dressing with min assist OT Short Term Goal 3 (Week 2): Pt will complete toilet transfer with min assist OT Short Term Goal 4 (Week 2): Pt will complete shower transfer with min assist OT Short Term Goal 5 (Week 2): Pt will complete simpe meal prep with min assist, utilizing RUE as gross assist  Skilled Therapeutic Interventions/Progress Updates:    1) Engaged in ADL retraining with focus on functional mobility with RW, sit <> stand, standing balance, functional use of RUE, and increased attention to Rt.  Pt ambulated with RW to gather clothing prior to bathing at sit > stand level in shower.  Educated on proper technique for transfer into walk-in shower with RW.  Pt with improved awareness of RUE during bathing this session, however still dropped wash cloth 3 times when attempting to wash buttocks.  Pt continues to require verbal cues for problem solving and sequencing of dressing tasks as always getting RUE stuck in shirt when dressing and attempting to stand on single leg to thread pants this session.  Provided pt with [facility] HEP handout and pt return demonstrated each activity, with encouragement to attempt tasks between therapy sessions to increase attention to RUE.  2) Engaged in therapeutic activity with focus on dynamic standing balance and balance reactions in simulated home environment.  Engaged in folding and hanging clothes with use of RW to ambulate to closet and education on energy conservation and balance during laundry task.  Encouraged pt to sit while  folding clothes to save energy for ambulation and other tasks and educated on how to transport small amounts of clothes with RW to ensure safety and both hands on RW.  Pt continues to require close supervision with all mobility as she tends to keep weight shifted to Lt in standing and has difficulty clearing Rt foot during ambulation requiring cue for safety and occasional steadying when dragging toe without awareness causing her to lose her balance.  Discussed upcoming outing to grocery store and meal prep activity with TR.  Therapy Documentation Precautions:  Precautions Precautions: Fall Precaution Comments: impulsive, R inattention Restrictions Weight Bearing Restrictions: No General:   Vital Signs: Oxygen Therapy SpO2: 98 % O2 Device: Not Delivered Pain: Pain Assessment Pain Assessment: No/denies pain Pain Score: 0-No pain  See FIM for current functional status  Therapy/Group: Individual Therapy  Simonne Come 08/16/2014, 10:48 AM

## 2014-08-16 NOTE — Progress Notes (Signed)
Physical Therapy Session Note  Patient Details  Name: Sherry Lambert MRN: [id number] Date of Birth: 1941-03-23  Today's Date: 08/16/2014 PT Individual Time: 1600-1700 PT Individual Time Calculation (min): 60 min   Short Term Goals: Week 2:  PT Short Term Goal 1 (Week 2): STG's = LTG's secondary to anticipated LOS  Skilled Therapeutic Interventions/Progress Updates:    Pt received seated in w/c, agreeable to participate in therapy. Session focused on dynamic gait, attention to R side, dynamic balance. Throughout session pt ambulated >200' w/ RW, close S with intermittent steadying assist due to R toe drag. Completed task of finding horseshoes hung at varying heights in rehab gym environment, required min cueing to find 7/7. For dynamic balance pt tossed 7x2 horseshoes in standing w/ R arm while retrieving horseshoes from R side to increase WBing and attention to R side. Pt completed x2 floor transfers w/ min cueing and Mod physical assist for first attempt and Min physical assist for second.  Pt required assist to maintain quadruped positioning and min boost to move from half kneel to tanding with hands on table. For increased attention to R side pt completed 10' on Nustep L4 w/ UE/LE, emphasis on maintaining grip on R hand. For dynamic gait pt ambulated in hospital hallway with horizontal and vertical head turns and cone negotiation. Pt required MinGuard for dynamic gait tasks w/ 1 LOB during horizontal head turns requiring MinA to correct. Did note that pt decreased self selected walking speed during dynamic tasks for improved safety. Session ended in pt's room, where she was left seated in w/c w/ all needs within reach.   Therapy Documentation Precautions:  Precautions Precautions: Fall Precaution Comments: impulsive, R inattention Restrictions Weight Bearing Restrictions: No Pain: Pain Assessment Pain Assessment: No/denies pain Pain Score: 0-No pain  See FIM for current functional  status  Therapy/Group: Individual Therapy  Rada Hay  Rada Hay, PT, DPT 08/16/2014, 7:51 AM

## 2014-08-16 NOTE — Progress Notes (Addendum)
Physical Therapy Session Note  Patient Details  Name: JAMIN HUMPHRIES MRN: [id number] Date of Birth: Oct 12, 1940  Today's Date: 08/16/2014 PT Individual Time: 1500-1530 PT Individual Time Calculation (min): 30 min   Short Term Goals: Week 2:  PT Short Term Goal 1 (Week 2): STG's = LTG's secondary to anticipated LOS  Skilled Therapeutic Interventions/Progress Updates:    Pt received seated in w/c; agreeable to therapy. Pt reporting no knee pain for past 2 days. Session focused on continued gait training, functional transfers, and initiation of discharge planning. Pt performed stand pivot transfers from w/c<>bed with rolling walker, R hand orthosis, and close supervision for stability, min guard for safe management of RUE during transfer. Pt performed functional ambulation x200' in controlled and community environment with rolling walker, R hand orthosis, and min guard to min A; pt with significant anterior LOB x3 episodes, all of which were secondary to R toe catch (due to limited R hip/knee flexion, decreased ankle dorsiflexion during RLE advancement). Donned Foot Up brace on  R ankle to increase R ankle dorsiflexion; however, pt exhibited R toe catch x2 episodes during subsequent gait trial x150' despite verbal/demonstration cueing to increase R hip/knee flexion. Will pursue MD order for leather toe cap on R shoe to decrease fall risk in home environment.  Readdressed discharge recommendations with pt; focused on recommended DME, 24/7 supervision. Pt verbalized understanding and was in full agreement. Session ended in pt room, where pt was left seated in w/c with all needs within reach.  Therapy Documentation Precautions:  Precautions Precautions: Fall Precaution Comments: impulsive, R inattention Restrictions Weight Bearing Restrictions: No Vital Signs: Therapy Vitals Temp: 99.6 F (37.6 C) Temp Source: Oral Pulse Rate: 87 Resp: 18 BP: (!) 135/52 mmHg Patient Position (if  appropriate): Sitting Oxygen Therapy SpO2: 96 % O2 Device: Not Delivered Pain: Pain Assessment Pain Assessment: No/denies pain Locomotion : Ambulation Ambulation/Gait Assistance: 4: Min guard;4: Min assist   See FIM for current functional status  Downgraded long term goal for basic transfers to supervision secondary to pt requiring hands-on assist to safely manage RUE during basic transfers. Downgraded short term memory goal to supervision due to pt inability to recall safe technique for functional mobility without cueing. Addendum: Downgraded long term goal for furniture transfer and floor transfer secondary to slow pt progress.  Therapy/Group: Individual Therapy  Forrester Blando, Malva Cogan 08/16/2014, 3:43 PM

## 2014-08-16 NOTE — Progress Notes (Signed)
Subjective/Complaints: 73 y.o. female with hypertension, peripheral vascular disease, diabetes mellitus, peripheral arterial disease and COPD experienced acute onset of weakness and numbness involving the right side on 07/28/2014. She had been taking aspirin daily. 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 MRI showed acute subacute left thalamic hemorrhage. Carotid Dopplers no ICA stenosis.Echocardiogram ejection fraction 81% grade 1 diastolic dysfunction. Blood pressure was elevated requiring immediate intervention with labetalol IV. NIH stroke score was 5  No pain c/os, no nausea, no constipation or diarrhea  Review of Systems - Negative except as above Objective: Vital Signs: Blood pressure 140/66, pulse 81, temperature 97.9 F (36.6 C), temperature source Oral, resp. rate 19, height 5' (1.524 m), weight 63.05 kg (139 lb), SpO2 100 %. No results found. Results for orders placed or performed during the hospital encounter of 08/03/14 (from the past 72 hour(s))  Glucose, capillary     Status: None   Collection Time: 08/13/14 12:03 PM  Result Value Ref Range   Glucose-Capillary 94 70 - 99 mg/dL   Comment 1 Notify RN   Glucose, capillary     Status: Abnormal   Collection Time: 08/13/14  4:22 PM  Result Value Ref Range   Glucose-Capillary 139 (H) 70 - 99 mg/dL   Comment 1 Notify RN   Glucose, capillary     Status: Abnormal   Collection Time: 08/13/14  8:40 PM  Result Value Ref Range   Glucose-Capillary 109 (H) 70 - 99 mg/dL  Glucose, capillary     Status: Abnormal   Collection Time: 08/14/14  6:58 AM  Result Value Ref Range   Glucose-Capillary 121 (H) 70 - 99 mg/dL  Glucose, capillary     Status: None   Collection Time: 08/14/14 11:23 AM  Result Value Ref Range   Glucose-Capillary 97 70 - 99 mg/dL   Comment 1 Notify RN   Glucose, capillary     Status: Abnormal   Collection Time: 08/14/14  4:24 PM  Result Value Ref Range   Glucose-Capillary 134 (H) 70 - 99 mg/dL   Comment 1 Notify RN   Glucose, capillary     Status: Abnormal   Collection Time: 08/14/14  9:15 PM  Result Value Ref Range   Glucose-Capillary 129 (H) 70 - 99 mg/dL  Glucose, capillary     Status: Abnormal   Collection Time: 08/15/14  7:07 AM  Result Value Ref Range   Glucose-Capillary 118 (H) 70 - 99 mg/dL   Comment 1 Notify RN   Glucose, capillary     Status: None   Collection Time: 08/15/14 11:39 AM  Result Value Ref Range   Glucose-Capillary 96 70 - 99 mg/dL   Comment 1 Notify RN   Glucose, capillary     Status: Abnormal   Collection Time: 08/15/14  4:24 PM  Result Value Ref Range   Glucose-Capillary 136 (H) 70 - 99 mg/dL   Comment 1 Notify RN   Glucose, capillary     Status: Abnormal   Collection Time: 08/15/14  8:48 PM  Result Value Ref Range   Glucose-Capillary 160 (H) 70 - 99 mg/dL  Glucose, capillary     Status: Abnormal   Collection Time: 08/16/14  6:32 AM  Result Value Ref Range   Glucose-Capillary 116 (H) 70 - 99 mg/dL     HEENT: normal, eyes non injected no icterus, EOMI, no nystagmus Cardio: RRR and no murmur Resp: CTA B/L  and unlabored GI: BS positive and NT,ND Extremity:  No Edema Skin:   Intact, no evidence of rash Neuro: Alert/Oriented, Cranial Nerve II-XII normal, Abnormal Sensory absent LT in Right hand and foot, Abnormal Motor 4/5 on Right side, 5/5 on left and Abnormal FMC Ataxic/ dec FMC RUE and RLE.Absent PP/LT and proprioception on right. Mod dysmetria FNF on RIght side Musc/Skel: Full AROM without pain in BUE Gen NAD   Assessment/Plan: 1. 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 Physiatrist is providing close team supervision and 24 hour management of active medical problems listed below. Physiatrist  and rehab team continue to assess barriers to discharge/monitor patient progress toward functional and medical goals.  FIM: FIM - Bathing Bathing Steps Patient Completed: Chest, Right Arm, Left Arm, Abdomen, Front perineal area, Buttocks, Left lower leg (including foot), Right lower leg (including foot), Left upper leg, Right upper leg Bathing: 4: Steadying assist  FIM - Upper Body Dressing/Undressing Upper body dressing/undressing steps patient completed: Thread/unthread right sleeve of pullover shirt/dresss, Thread/unthread left sleeve of pullover shirt/dress, Put head through opening of pull over shirt/dress, Pull shirt over trunk Upper body dressing/undressing: 5: Supervision: Safety issues/verbal cues FIM - Lower Body Dressing/Undressing Lower body dressing/undressing steps patient completed: Thread/unthread right underwear leg, Thread/unthread left underwear leg, Pull underwear up/down, Thread/unthread right pants leg, Thread/unthread left pants leg, Pull pants up/down, Don/Doff left shoe Lower body dressing/undressing: 3: Mod-Patient completed 50-74% of tasks  FIM - Toileting Toileting steps completed by patient: Adjust clothing prior to toileting, Adjust clothing after toileting Toileting Assistive Devices: Grab bar or rail for support Toileting: 0: Activity did not occur  FIM - Radio producer Devices: Product manager Transfers: 0-Activity did not occur  FIM - Control and instrumentation engineer Devices: Copy: 4: Bed > Chair or W/C: Min A (steadying Pt. > 75%), 4: Chair or W/C > Bed: Min A (steadying Pt. > 75%)  FIM - Locomotion: Wheelchair Distance: 40 Locomotion: Wheelchair: 1: Total Assistance/staff pushes wheelchair (Pt<25%) FIM - Locomotion: Ambulation Locomotion: Ambulation Assistive Devices: Administrator Ambulation/Gait Assistance: 4: Min guard, 4: Min assist Locomotion: Ambulation: 2: Travels 50 - 149  ft with minimal assistance (Pt.>75%)  Comprehension Comprehension Mode: Auditory Comprehension: 5-Understands basic 90% of the time/requires cueing < 10% of the time  Expression Expression Mode: Verbal Expression: 5-Expresses basic 90% of the time/requires cueing < 10% of the time.  Social Interaction Social Interaction: 6-Interacts appropriately with others with medication or extra time (anti-anxiety, antidepressant).  Problem Solving Problem Solving: 5-Solves basic 90% of the time/requires cueing < 10% of the time  Memory Memory: 5-Recognizes or recalls 90% of the time/requires cueing < 10% of the time  Medical Problem List and Plan: 1. 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 DVT Prophylaxis/Anticoagulation: Lovenox initiated 08/03/2014 for DVT prophylaxis. Monitor platelet counts of any signs of bleeding 3. Pain Management: Tylenol as needed, Left sided neck pain degenerative with decreased mobility, no clearcut radicular symptoms but may radiate to  Shoulder  and chest area will add sportscreme and K pad 4. COPD.Dulera twice a day. 5. Neuropsych: This patient is capable of making decisions on her own behalf. 6. Skin/Wound Care: Routine skin checks 7. Fluids/Electrolytes/Nutrition: Strict I and O's. Labs nl   8. Hypertension. Lisinopril 10 mg daily. Monitor with increased mobility  LOS (Days) 13 A FACE  TO FACE EVALUATION WAS PERFORMED  KIRSTEINS,ANDREW E 08/16/2014, 8:26 AM

## 2014-08-17 ENCOUNTER — Inpatient Hospital Stay (HOSPITAL_COMMUNITY): Payer: Medicare HMO | Admitting: Physical Therapy

## 2014-08-17 ENCOUNTER — Inpatient Hospital Stay (HOSPITAL_COMMUNITY): Payer: Medicare HMO | Admitting: Occupational Therapy

## 2014-08-17 ENCOUNTER — Encounter (HOSPITAL_COMMUNITY): Payer: Self-pay | Admitting: *Deleted

## 2014-08-17 LAB — GLUCOSE, CAPILLARY
GLUCOSE-CAPILLARY: 130 mg/dL — AB (ref 70–99)
GLUCOSE-CAPILLARY: 117 mg/dL — AB (ref 70–99)
GLUCOSE-CAPILLARY: 91 mg/dL (ref 70–99)
Glucose-Capillary: 150 mg/dL — ABNORMAL HIGH (ref 70–99)

## 2014-08-17 LAB — CREATININE, SERUM
Creatinine, Ser: 0.72 mg/dL (ref 0.50–1.10)
GFR calc Af Amer: >90 mL/min (ref >90)
GFR, EST NON AFRICAN AMERICAN: 84 mL/min — AB (ref >90)

## 2014-08-17 MED ORDER — INSULIN ASPART 100 UNIT/ML ~~LOC~~ SOLN
SUBCUTANEOUS | Status: AC
  Filled 2014-08-17: qty 1

## 2014-08-17 NOTE — Progress Notes (Signed)
Occupational Therapy Session Note  Patient Details  Name: Sherry Lambert MRN: [id number] Date of Birth: 04/13/1941  Today's Date: 08/17/2014 OT Individual Time: 4098-1191 and 1305-1405 OT Individual Time Calculation (min): 60 min and 60 min   Short Term Goals: Week 2:  OT Short Term Goal 1 (Week 2): Pt will perform UB dressing with supervision (fastening bra in lap and don over head)  OT Short Term Goal 2 (Week 2): Pt will complete LB dressing with min assist OT Short Term Goal 3 (Week 2): Pt will complete toilet transfer with min assist OT Short Term Goal 4 (Week 2): Pt will complete shower transfer with min assist OT Short Term Goal 5 (Week 2): Pt will complete simpe meal prep with min assist, utilizing RUE as gross assist  Skilled Therapeutic Interventions/Progress Updates:    1) Engaged in self-care retraining with focus on increased independence with self-care tasks, attention to Rt, and functional use of RUE.  Pt declined shower this session, requesting to only wash up at sink.  Pt washed perineal area and buttocks in standing without LOB and only min verbal cues to shift weight to Rt to promote more midline standing balance.  Utilized RUE during oral hygiene and cleaning dentures with toothbrush in Rt hand with increased visual attention and coordination.  Engaged in transfer training with both w/c and RW with education to attend to RUE and RLE during mobility to increase safety.  Pt required min assist both stand pivot w/c <> toilet and ambulating to toilet with RW.  Engaged in [facility] activity with stringing beads with focus on visually attending to [place].  2) Engaged in therapeutic activity with focus on visual attention to RUE and functional use of RUE in sitting and standing.  Connect 4 activity in standing with focus on use of RUE to obtain and manipulate multiple pieces in hand and translation from palm to finger tips.  Pt continues to require verbal cues for weight shifting to Rt to  promote midline standing balance.  Engaged in [facility] with [place], in-hand manipulation, and handwriting tasks.  Encouraged pt to utilize lined paper as pt's handwriting improved marginally with use of lines.  Pt ambulated back to room with RW and min/steady assist.  Therapy Documentation Precautions:  Precautions Precautions: Fall Precaution Comments: impulsive, R inattention Restrictions Weight Bearing Restrictions: No General:   Vital Signs: Therapy Vitals Pulse Rate: 80 BP: (!) 133/53 mmHg Patient Position (if appropriate): Sitting Pain:  Pt with no c/o pain  See FIM for current functional status  Therapy/Group: Individual Therapy  Simonne Come 08/17/2014, 10:53 AM

## 2014-08-17 NOTE — Progress Notes (Signed)
Recreational Therapy Session Note  Patient Details  Name: Sherry Lambert MRN: [id number] Date of Birth: 04-Apr-1941 Today's Date: 08/17/2014  Pain: no c/o Skilled Therapeutic Interventions/Progress Updates: Session focused on meal planning & creating a grocery list for ingredients needed to prepare desire meal with [employer].  Discussion with pt about potential community reintegration/outing to the grocery store tomorrow.  Discussion included purpose of the outing & potential goals.  Pt agreeable to participate.    Therapy/Group: Individual Therapy Sherry Lambert 08/17/2014, 4:40 PM

## 2014-08-17 NOTE — Progress Notes (Signed)
Subjective/Complaints: 73 y.o. female with hypertension, peripheral vascular disease, diabetes mellitus, peripheral arterial disease and COPD experienced acute onset of weakness and numbness involving the right side on 07/28/2014. She had been taking aspirin daily. 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 MRI showed acute subacute left thalamic hemorrhage. Carotid Dopplers no ICA stenosis.Echocardiogram ejection fraction 76% grade 1 diastolic dysfunction. Blood pressure was elevated requiring immediate intervention with labetalol IV. NIH stroke score was 5  Pt slept well, good therapy yest  Review of Systems - Negative except as above Objective: Vital Signs: Blood pressure 133/53, pulse 80, temperature 98.1 F (36.7 C), temperature source Oral, resp. rate 17, height 5' (1.524 m), weight 63.05 kg (139 lb), SpO2 95 %. No results found. Results for orders placed or performed during the hospital encounter of 08/03/14 (from the past 72 hour(s))  Glucose, capillary     Status: None   Collection Time: 08/14/14 11:23 AM  Result Value Ref Range   Glucose-Capillary 97 70 - 99 mg/dL   Comment 1 Notify RN   Glucose, capillary     Status: Abnormal   Collection Time: 08/14/14  4:24 PM  Result Value Ref Range   Glucose-Capillary 134 (H) 70 - 99 mg/dL   Comment 1 Notify RN   Glucose, capillary     Status: Abnormal   Collection Time: 08/14/14  9:15 PM  Result Value Ref Range   Glucose-Capillary 129 (H) 70 - 99 mg/dL  Glucose, capillary     Status: Abnormal   Collection Time: 08/15/14  7:07 AM  Result Value Ref Range   Glucose-Capillary 118 (H) 70 - 99 mg/dL   Comment 1 Notify RN   Glucose, capillary     Status: None   Collection Time: 08/15/14 11:39 AM  Result Value Ref Range   Glucose-Capillary 96 70 - 99 mg/dL   Comment 1 Notify RN   Glucose, capillary     Status: Abnormal   Collection Time: 08/15/14  4:24 PM  Result Value Ref Range   Glucose-Capillary 136 (H) 70 - 99 mg/dL   Comment 1 Notify RN   Glucose, capillary     Status: Abnormal   Collection Time: 08/15/14  8:48 PM  Result Value Ref Range   Glucose-Capillary 160 (H) 70 - 99 mg/dL  Glucose, capillary     Status: Abnormal   Collection Time: 08/16/14  6:32 AM  Result Value Ref Range   Glucose-Capillary 116 (H) 70 - 99 mg/dL  Glucose, capillary     Status: None   Collection Time: 08/16/14 12:06 PM  Result Value Ref Range   Glucose-Capillary 96 70 - 99 mg/dL  Glucose, capillary     Status: None   Collection Time: 08/16/14  5:10 PM  Result Value Ref Range   Glucose-Capillary 87 70 - 99 mg/dL  Glucose, capillary     Status: Abnormal   Collection Time: 08/16/14  8:40 PM  Result Value Ref Range   Glucose-Capillary 131 (H) 70 - 99 mg/dL   Comment 1 Notify RN   Glucose, capillary     Status: Abnormal   Collection Time: 08/17/14  6:33 AM  Result Value Ref Range   Glucose-Capillary 130 (H) 70 - 99 mg/dL   Comment 1 Notify RN      HEENT: normal, eyes non injected no icterus, EOMI, no nystagmus Cardio: RRR and no murmur Resp: CTA B/L and unlabored GI: BS  positive and NT,ND Extremity:  No Edema Skin:   Intact, no evidence of rash Neuro: Alert/Oriented, Cranial Nerve II-XII normal, Abnormal Sensory absent LT in Right hand and foot, Abnormal Motor 4/5 on Right side, 5/5 on left and Abnormal FMC Ataxic/ dec FMC RUE and RLE.Absent PP/LT and proprioception on right. Mod dysmetria FNF on RIght side Musc/Skel: Full AROM without pain in BUE Gen NAD   Assessment/Plan: 1. 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 Physiatrist is providing close team supervision and 24 hour management of active medical problems listed below. Physiatrist and rehab team continue to assess  barriers to discharge/monitor patient progress toward functional and medical goals.  FIM: FIM - Bathing Bathing Steps Patient Completed: Chest, Right Arm, Left Arm, Abdomen, Front perineal area, Buttocks, Left lower leg (including foot), Right lower leg (including foot), Left upper leg, Right upper leg Bathing: 4: Steadying assist  FIM - Upper Body Dressing/Undressing Upper body dressing/undressing steps patient completed: Thread/unthread right sleeve of pullover shirt/dresss, Thread/unthread left sleeve of pullover shirt/dress, Put head through opening of pull over shirt/dress, Pull shirt over trunk Upper body dressing/undressing: 5: Supervision: Safety issues/verbal cues FIM - Lower Body Dressing/Undressing Lower body dressing/undressing steps patient completed: Thread/unthread right underwear leg, Thread/unthread left underwear leg, Pull underwear up/down, Thread/unthread right pants leg, Thread/unthread left pants leg, Pull pants up/down, Don/Doff left shoe Lower body dressing/undressing: 4: Min-Patient completed 75 plus % of tasks  FIM - Toileting Toileting steps completed by patient: Adjust clothing prior to toileting, Performs perineal hygiene Toileting Assistive Devices: Grab bar or rail for support Toileting: 3: Mod-Patient completed 2 of 3 steps  FIM - Radio producer Devices: Product manager Transfers: 0-Activity did not occur  FIM - Control and instrumentation engineer Devices: Copy: 5: Bed > Chair or W/C: Supervision (verbal cues/safety issues), 5: Chair or W/C > Bed: Supervision (verbal cues/safety issues)  FIM - Locomotion: Wheelchair Distance: 40 Locomotion: Wheelchair: 0: Activity did not occur (Pt is ambulatory) FIM - Locomotion: Ambulation Locomotion: Ambulation Assistive Devices: Environmental consultant - Rolling, Orthosis, Other (comment) Ambulation/Gait Assistance: 4: Min guard, 5: Supervision Locomotion: Ambulation:  4: Travels 150 ft or more with minimal assistance (Pt.>75%)  Comprehension Comprehension Mode: Auditory Comprehension: 5-Understands complex 90% of the time/Cues < 10% of the time  Expression Expression Mode: Verbal Expression: 5-Expresses complex 90% of the time/cues < 10% of the time  Social Interaction Social Interaction: 5-Interacts appropriately 90% of the time - Needs monitoring or encouragement for participation or interaction.  Problem Solving Problem Solving: 5-Solves complex 90% of the time/cues < 10% of the time  Memory Memory: 5-Recognizes or recalls 90% of the time/requires cueing < 10% of the time  Medical Problem List and Plan: 1. 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 DVT Prophylaxis/Anticoagulation: Lovenox initiated 08/03/2014 for DVT prophylaxis. Monitor platelet counts of any signs of bleeding 3. Pain Management: Tylenol as needed, Left sided neck pain degenerative with decreased mobility, no clearcut radicular symptoms but may radiate to  Shoulder  and chest area will add sportscreme and K pad 4. COPD.Dulera twice a day. 5. Neuropsych: This patient is capable of making decisions on her own behalf. 6. Skin/Wound Care: Routine skin checks 7. Fluids/Electrolytes/Nutrition: Strict I and O's. Labs nl   8. Hypertension. Lisinopril 10 mg daily. Monitor with increased mobility  LOS (Days) 14 A FACE  TO FACE EVALUATION WAS PERFORMED  Trystan Akhtar E 08/17/2014, 8:21 AM

## 2014-08-17 NOTE — Progress Notes (Signed)
Physical Therapy Session Note  Patient Details  Name: Sherry Lambert MRN: [id number] Date of Birth: 03/06/1941  Today's Date: 08/17/2014 PT Individual Time: 1502-1607 PT Individual Time Calculation (min): 65 min   Short Term Goals: Week 2:  PT Short Term Goal 1 (Week 2): STG's = LTG's secondary to anticipated LOS  Skilled Therapeutic Interventions/Progress Updates:    Pt received seated in w/c; agreeable to therapy. Pt received seated in w/c; agreeable to therapy. Session focused on dynamic standing balance, family education/training, community mobility and increasing gait stability. See below for detailed description of NMR focusing on increasing motor control in R knee and improving stability with functional weight shifting.   Son present for latter half of this session. Orthotist present with R shoe containing leather toe cap to decrease LOB secondary to R toe drag during during functional ambulation. Pt performed functional ambulation x150' in controlled with rolling walker, R hand orthosis, R heel wedge (to control genu recurvatum), and leather toe cap on R shoe. Pt required close supervision. Pt did exhibit toe catch x2 episodes during ambulation; however, no LOB sustained due to toe cap. Pt, orthotist, and this PT all in agreement to pursue R Foot Up brace to increase stability with community mobility. Outside hospital lobby, pt performed functional ambulation   Pt performed functional ambulation x160' in community environment (outside hospital lobby) with rolling walker, R hand orthosis with close supervision to min guard secondary to R toe catch x3 episodes resulting in minor LOB's with effective self-recovery. Pt negotiated single curb step with rolling walker and min A, manual stabilization of rolling walker, and mod verbal cueing for technique. Pt also performed w/c mobility x200' in community environment with L hemi technique and mod I; pt negotiated standard ramp, ascending forward and  descending backwards without assist.  Upon returning to rehab unit, discussed the following with pt and son: reiterated recommendation for 24/7 supervision secondary to sensory impairments and decreased attention to RUE/RLE; recommendation for supervision during all functional transfers and ambulation; strong recommendation for son to return for additional hands-on family training to facilitate safety upon D/C home. Pt/son verbalized understanding and were in full agreement. Session ended in pt room, where pt was left seated in w/c with son present and all needs within reach.   Long term goal addressing dynamic standing balance downgraded to supervision secondary to slow pt progress. Added long term goal for dynamic sitting balance.   Therapy Documentation Precautions:  Precautions Precautions: Fall Precaution Comments: impulsive, R inattention Restrictions Weight Bearing Restrictions: No Vital Signs: Therapy Vitals Temp: 98.1 F (36.7 C) Temp Source: Oral Pulse Rate: 80 Resp: 17 BP: (!) 133/53 mmHg Patient Position (if appropriate): Sitting Oxygen Therapy SpO2: 95 % O2 Device: Not Delivered Pain: Pain Assessment Pain Assessment: No/denies pain Locomotion : Ambulation Ambulation/Gait Assistance: 4: Min guard;5: Supervision Wheelchair Mobility Distance: 200  NMR: In standing, pt engaged in modified bowling activity using RUE without UE support. Activity focused on functional use of RUE and functional weight shifting in standing. Bowling ball returned to pt via rolling on floor to increase R knee control during functional bending to pick up ball. Overall, pt required close supervision for stability/balance. Pt did require min A secondary to LOB to R side x2 episodes during activity.  See FIM for current functional status  Therapy/Group: Individual Therapy  [place], Malva Cogan 08/17/2014, 8:12 PM

## 2014-08-18 ENCOUNTER — Inpatient Hospital Stay (HOSPITAL_COMMUNITY): Payer: Medicare HMO | Admitting: Occupational Therapy

## 2014-08-18 ENCOUNTER — Inpatient Hospital Stay (HOSPITAL_COMMUNITY): Payer: Medicare HMO | Admitting: *Deleted

## 2014-08-18 LAB — GLUCOSE, CAPILLARY
GLUCOSE-CAPILLARY: 115 mg/dL — AB (ref 70–99)
Glucose-Capillary: 110 mg/dL — ABNORMAL HIGH (ref 70–99)
Glucose-Capillary: 146 mg/dL — ABNORMAL HIGH (ref 70–99)

## 2014-08-18 NOTE — Progress Notes (Signed)
Social Work Patient ID: Sherry Lambert, female   DOB: 1940/11/25, 73 y.o.   MRN: [id number] Spoke with son via telephone to schedule family education form tomorrow at 1;00 team aware.  Son aware Mom's discharge for Friday.

## 2014-08-18 NOTE — Discharge Summary (Signed)
Discharge summary job # [id number]

## 2014-08-18 NOTE — Progress Notes (Signed)
Occupational Therapy Session Note  Patient Details  Name: Sherry Lambert MRN: [id number] Date of Birth: 1941/03/15  Today's Date: 08/18/2014 OT Individual Time: 0915-1000 OT Individual Time Calculation (min): 45 min    Short Term Goals: Week 2:  OT Short Term Goal 1 (Week 2): Pt will perform UB dressing with supervision (fastening bra in lap and don over head)  OT Short Term Goal 2 (Week 2): Pt will complete LB dressing with min assist OT Short Term Goal 3 (Week 2): Pt will complete toilet transfer with min assist OT Short Term Goal 4 (Week 2): Pt will complete shower transfer with min assist OT Short Term Goal 5 (Week 2): Pt will complete simpe meal prep with min assist, utilizing RUE as gross assist  Skilled Therapeutic Interventions/Progress Updates:    Engaged in therapeutic activity with focus on standing balance and functional use of RUE.  Upon arrival pt reports already dressed and completed grooming tasks.  Ambulated to therapy gym with focus on increased attention to Rt during mobility, pt continues to leave Rt hand on RW with stand > sit.  Engaged in horse shoe tossing activity with focus on weight shifting to Rt to promote midline stance with reaching outside BOS to Rt.  [facility] activity with in-hand manipulation and translation from hand to finger tips with dropping approx 10% of pegs.  Returned to room ambulating with RW.  Issued pt shoe button for Rt shoe as elastic shoelaces not keeping footup brace secure.  Pt return demonstrated fastening shoe with shoe button and hooking footup brace.  Therapy Documentation Precautions:  Precautions Precautions: Fall Precaution Comments: impulsive, R inattention Restrictions Weight Bearing Restrictions: No Pain:   Pt with no c/o pain  See FIM for current functional status  Therapy/Group: Individual Therapy  Simonne Come 08/18/2014, 3:26 PM

## 2014-08-18 NOTE — Progress Notes (Signed)
Physical Therapy Session Note  Patient Details  Name: Sherry Lambert MRN: [id number] Date of Birth: 12-22-1940  Today's Date: 08/18/2014 PT Individual Time: [phone] (Co-tx with rec therapist from [phone]) PT Individual Time Calculation (min): 45 min   Short Term Goals: Week 2:  PT Short Term Goal 1 (Week 2): STG's = LTG's secondary to anticipated LOS  Skilled Therapeutic Interventions/Progress Updates:    Co-treatment with rec therapist focusing on dynamic standing balance, functional ambulation, and stair negotiation. Pt performed functional ambulation 2 x150' in controlled environments with rolling walker, R hand orthosis, R Foot Up brace, and supervision during linear gait, min guard during turning. Pt performed multiple sit<>stand transfers with supervision, subtle to min cueing for safety/management of RUE. Pt negotiated 12 stairs laterally with bilat UE support at single R rail to ascend, step-to pattern, and min A. See below for detailed description of NMR interventions performed with rec therapist. Session ended in pt room, where pt was left seated in w/c with all needs within reach.  Therapy Documentation Precautions:  Precautions Precautions: Fall Precaution Comments: impulsive, R inattention Restrictions Weight Bearing Restrictions: No Vital Signs: Therapy Vitals BP: 138/74 mmHg Oxygen Therapy O2 Device: Not Delivered Pain: Pain Assessment Pain Assessment: No/denies pain Locomotion : Ambulation Ambulation/Gait Assistance: 5: Supervision;4: Min guard  NMR: Treatments Neuromuscular Facilitation: Right;Lower Extremity;Activity to increase motor control;Activity to increase grading;Activity to increase coordination;Forced use;Upper Extremity;Activity to increase lateral weight shifting;Activity to increase anterior-posterior weight shifting Standing without UE support, pt performed kicking rolling ball on ground with LLE to increase RLE weightbearing, improve motor  control in R knee, and to increase stability with functional weight shifting. Pt required supervison to min A for stability secondary to single LOB (anterior and to R side). Transitioned to multidirectional catching/throwing large ball (pt still standing without UE support with supervision) for forced use of RUE and balance perturbations; no overt LOB. Final activity involved variation of throwing/catching to promote functional utilization of RUE/RLE, stability with functional weight shifting, and coordination.  See FIM for current functional status  Therapy/Group: Individual Therapy and Co-Treatment  Clinton Wahlberg, Malva Cogan 08/18/2014, 8:08 PM

## 2014-08-18 NOTE — Progress Notes (Signed)
Physical Therapy Session Note  Patient Details  Name: Sherry Lambert MRN: [id number] Date of Birth: 15-Sep-1940  Today's Date: 08/18/2014 PT Concurrent Time: 1300-1430 PT Concurrent Time Calculation (min): 90 min  Short Term Goals: Week 2:  PT Short Term Goal 1 (Week 2): STG's = LTG's secondary to anticipated LOS  Skilled Therapeutic Interventions/Progress Updates:  Pt participated in formal community outing to grocery store, co-tx with Rec Therapist for emphasis on functional transfers and ambulation as well as R inattention in community setting. Pt req min-mod Ax1person for negotiation up van steps x2 and Ax2 persons for negotiation down steps x2 due to non-traditional step height for safety with mod cues for seq.  Pt req overall supervision while amb in busy grocery store with use of shopping cart, req min cues for safety, obstacle negotiation and utilizing R UE to obtain various items from shelf as well as pay for items. Pt req supervision for safe sit<>stand t/fs from w/c and car seats. Pt req intermittent min cues for safety due to mild impulsivity as well as to attend position of R UE and obstacles/items on R side. Pt with excellent tolerance to outing overall. Pt left sitting in w/c at end of session w/ all needs in reach.   Therapy Documentation Precautions:  Precautions Precautions: Fall Precaution Comments: impulsive, R inattention Restrictions Weight Bearing Restrictions: No  See FIM for current functional status  Therapy/Group: Co-Treatment with Rec Therapy  Gilmore Laroche 08/18/2014, 2:59 PM

## 2014-08-18 NOTE — Patient Care Conference (Signed)
Inpatient RehabilitationTeam Conference and Plan of Care Update Date: 08/18/2014   Time: 10;55 Am    Patient Name: Sherry Lambert      Medical Record Number: [id number]  Date of Birth: [birth year] Sex: Female         Room/Bed: 4W20C/4W20C-01 Payor Info: Payor: [employer] MEDICARE / Plan: [place] [employer] MEDICARE HMO/PPO / Product Type: *No Product type* /    Admitting Diagnosis: L CVA  Admit Date/Time:  08/03/2014  6:07 PM Admission Comments: No comment available   Primary Diagnosis:  Nontraumatic intracerebral hemorrhage Principal Problem: Nontraumatic intracerebral hemorrhage  Patient Active Problem List   Diagnosis Date Noted  . 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 Nontraumatic intracerebral hemorrhage 08/03/2014  . Cerebral hemorrhage   . Cytotoxic brain edema   . Intracerebral hemorrhage 07/28/2014  . Hypertensive urgency 07/28/2014  . [name] (intracerebral hemorrhage) 07/28/2014  . Peripheral arterial disease 01/05/2014  . Osteopenia 11/12/2013  . Dyslipidemia 09/19/2013  . Stricture and stenosis of esophagus 12/23/2012  . Dysphagia, unspecified 12/01/2012  . [name] 12/01/2012  . DM type 2 goal A1C below 7.5 11/10/2012  . HTN (hypertension) 11/10/2012  . Other emphysema 11/10/2012    Expected Discharge Date: Expected Discharge Date: 08/20/14  Team Members Present: Physician leading conference: Dr. Alysia Penna Social Worker Present: Ovidio Kin, LCSW Nurse Present: Heather Roberts, RN PT Present: Raylene Everts, PT;Caroline Lacinda Axon, PT;Blair Hobble, PT OT Present: Simonne Come, Dorothyann Gibbs, OT SLP Present: Windell Moulding, SLP PPS Coordinator present : Daiva Nakayama, RN, CRRN     Current Status/Progress Goal Weekly Team Focus  Medical   Right Hemisensory deficits  home d/c  family training   Bowel/Bladder     cont B & B        Swallow/Nutrition/ Hydration     na        ADL's   min  assist bathing, supervision UB dressing, min assist LB dressing, min assist stand pivot transfers and transfers with RW  supervision overall, Mod I toilet transfers and toileting  RUE NMR and functional use, standing balance, transfers, homemaking tasks   Mobility   Min guard to Min A overall  Mod I at w/c level; supervision ambulation, stairs  Safety awareness, attention to R side of body, w/c mobility, postural/gait stability, stairs, hands-on family training, D/C planning   Communication     na        Safety/Cognition/ Behavioral Observations    no unsafe behaviors        Pain     less than 3 monitor   no issues     Skin     rash healed up-dc hydrocortisone   monitor skin        *See Care Plan and progress notes for long and short-term goals.  Barriers to Discharge: needs family training    Possible Resolutions to Barriers:  see above    Discharge Planning/Teaching Needs:  Home with hired caregiver-son coming in tomorrow to go through family education      Team Discussion:  Supervision over all family education set up for tomorrow. Son has hired a Building control surveyor while he works.  Rash cleared up.   Revisions to Treatment Plan:  None   Continued Need for Acute Rehabilitation Level of Care: The patient requires daily medical management by a physician with specialized training in physical medicine and rehabilitation for the following conditions: Daily direction of a multidisciplinary physical rehabilitation  program to ensure safe treatment while eliciting the highest outcome that is of practical value to the patient.: Yes Daily medical management of patient stability for increased activity during participation in an intensive rehabilitation regime.: Yes Daily analysis of laboratory values and/or radiology reports with any subsequent need for medication adjustment of medical intervention for : Neurological problems  Kathan Kirker, Gardiner Rhyme 08/18/2014, 2:40 PM

## 2014-08-18 NOTE — Progress Notes (Signed)
Social Work Patient ID: Carollee Massed, female   DOB: Jan 04, 1941, 73 y.o.   MRN: [id number] Met with pt and son aware of team conference progression toward goals and discharge 12/18.  Scheduled family education for son tomorrow. He will train the caregiver he has hired.  Pt feels ready to go home Friday.  Discuss need for wheelchair with son tomorrow, pt doesn;t feel needs one.

## 2014-08-18 NOTE — Progress Notes (Signed)
Subjective/Complaints: Still with numbness on Right side   No pain c/os .  Looking fwd to D/C  Review of Systems - Negative except as above Objective: Vital Signs: Blood pressure 118/50, pulse 76, temperature 97.9 F (36.6 C), temperature source Oral, resp. rate 18, height 5' (1.524 m), weight 63.05 kg (139 lb), SpO2 97 %. No results found. Results for orders placed or performed during the hospital encounter of 08/03/14 (from the past 72 hour(s))  Glucose, capillary     Status: None   Collection Time: 08/15/14 11:39 AM  Result Value Ref Range   Glucose-Capillary 96 70 - 99 mg/dL   Comment 1 Notify RN   Glucose, capillary     Status: Abnormal   Collection Time: 08/15/14  4:24 PM  Result Value Ref Range   Glucose-Capillary 136 (H) 70 - 99 mg/dL   Comment 1 Notify RN   Glucose, capillary     Status: Abnormal   Collection Time: 08/15/14  8:48 PM  Result Value Ref Range   Glucose-Capillary 160 (H) 70 - 99 mg/dL  Glucose, capillary     Status: Abnormal   Collection Time: 08/16/14  6:32 AM  Result Value Ref Range   Glucose-Capillary 116 (H) 70 - 99 mg/dL  Glucose, capillary     Status: None   Collection Time: 08/16/14 12:06 PM  Result Value Ref Range   Glucose-Capillary 96 70 - 99 mg/dL  Glucose, capillary     Status: None   Collection Time: 08/16/14  5:10 PM  Result Value Ref Range   Glucose-Capillary 87 70 - 99 mg/dL  Glucose, capillary     Status: Abnormal   Collection Time: 08/16/14  8:40 PM  Result Value Ref Range   Glucose-Capillary 131 (H) 70 - 99 mg/dL   Comment 1 Notify RN   Creatinine, serum     Status: Abnormal   Collection Time: 08/17/14  5:00 AM  Result Value Ref Range   Creatinine, Ser 0.72 0.50 - 1.10 mg/dL   GFR calc non Af Amer 84 (L) >90 mL/min   GFR calc Af Amer >90 >90 mL/min    Comment: (NOTE) The eGFR has been calculated using the CKD EPI equation. This calculation has not been validated in all clinical situations. eGFR's persistently <90 mL/min  signify possible Chronic Kidney Disease.   Glucose, capillary     Status: Abnormal   Collection Time: 08/17/14  6:33 AM  Result Value Ref Range   Glucose-Capillary 130 (H) 70 - 99 mg/dL   Comment 1 Notify RN   Glucose, capillary     Status: Abnormal   Collection Time: 08/17/14 11:53 AM  Result Value Ref Range   Glucose-Capillary 117 (H) 70 - 99 mg/dL  Glucose, capillary     Status: None   Collection Time: 08/17/14  4:41 PM  Result Value Ref Range   Glucose-Capillary 91 70 - 99 mg/dL  Glucose, capillary     Status: Abnormal   Collection Time: 08/17/14  8:42 PM  Result Value Ref Range   Glucose-Capillary 150 (H) 70 - 99 mg/dL  Glucose, capillary     Status: Abnormal   Collection Time: 08/18/14  6:51 AM  Result Value Ref Range   Glucose-Capillary 110 (H) 70 - 99 mg/dL     HEENT: normal, eyes non injected no icterus, EOMI, no nystagmus Cardio: RRR and no murmur Resp: CTA B/L and unlabored GI: BS positive and NT,ND Extremity:  No Edema Skin:   Intact, no evidence of rash  Neuro: Alert/Oriented, Cranial Nerve II-XII normal, Abnormal Sensory absent LT in Right hand and foot, Abnormal Motor 4/5 on Right side, 5/5 on left and Abnormal FMC Ataxic/ dec FMC RUE and RLE.Absent PP/LT and proprioception on right. Mod dysmetria FNF on RIght side Musc/Skel: Full AROM without pain in BUE Gen NAD   Assessment/Plan: 1. 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 Physiatrist is providing close team supervision and 24 hour management of active medical problems listed below. Physiatrist and rehab team continue to assess barriers to discharge/monitor patient progress toward functional and medical goals. Team conference today please see physician documentation under team conference tab, met with team face-to-face to discuss problems,progress, and goals.  Formulized individual treatment plan based on medical history, underlying problem and comorbidities.FIM: FIM - Bathing Bathing Steps Patient Completed: Chest, Abdomen, Front perineal area, Buttocks Bathing: 4: Steadying assist  FIM - Upper Body Dressing/Undressing Upper body dressing/undressing steps patient completed: Thread/unthread right sleeve of pullover shirt/dresss, Thread/unthread left sleeve of pullover shirt/dress, Put head through opening of pull over shirt/dress, Pull shirt over trunk Upper body dressing/undressing: 5: Supervision: Safety issues/verbal cues FIM - Lower Body Dressing/Undressing Lower body dressing/undressing steps patient completed: Thread/unthread right underwear leg, Thread/unthread left underwear leg, Pull underwear up/down, Thread/unthread right pants leg, Thread/unthread left pants leg, Pull pants up/down, Don/Doff left shoe Lower body dressing/undressing: 4: Min-Patient completed 75 plus % of tasks  FIM - Toileting Toileting steps completed by patient: Adjust clothing prior to toileting, Performs perineal hygiene Toileting Assistive Devices: Grab bar or rail for support Toileting: 3: Mod-Patient completed 2 of 3 steps  FIM - Radio producer Devices: Grab bars Toilet Transfers: 4-To toilet/BSC: Min A (steadying Pt. > 75%), 4-From toilet/BSC: Min A (steadying Pt. > 75%)  FIM - Bed/Chair Transfer Bed/Chair Transfer Assistive Devices: Copy: 5: Bed > Chair or W/C: Supervision (verbal cues/safety issues), 5: Chair or W/C > Bed: Supervision (verbal cues/safety issues)  FIM - Locomotion: Wheelchair Distance: 200 Locomotion: Wheelchair: 6: Travels 150 ft or more, turns around, maneuvers to table, bed or toilet, negotiates 3% grade: maneuvers on rugs and over door sills independently FIM - Locomotion: Ambulation Locomotion: Ambulation Assistive Devices: Environmental consultant - Rolling, Other (comment), Orthosis (R hand  orthosis) Ambulation/Gait Assistance: 4: Min guard, 5: Supervision Locomotion: Ambulation: 4: Travels 150 ft or more with minimal assistance (Pt.>75%)  Comprehension Comprehension Mode: Auditory Comprehension: 5-Understands complex 90% of the time/Cues < 10% of the time  Expression Expression Mode: Verbal Expression: 5-Expresses complex 90% of the time/cues < 10% of the time  Social Interaction Social Interaction: 5-Interacts appropriately 90% of the time - Needs monitoring or encouragement for participation or interaction.  Problem Solving Problem Solving: 5-Solves complex 90% of the time/cues < 10% of the time  Memory Memory: 5-Recognizes or recalls 90% of the time/requires cueing < 10% of the time  Medical Problem List and Plan: 1. 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 DVT Prophylaxis/Anticoagulation: Lovenox initiated 08/03/2014 for DVT prophylaxis. Monitor platelet counts of any signs of bleeding 3. Pain Management: Tylenol as needed, Left sided neck pain degenerative with decreased mobility, no clearcut radicular symptoms but may radiate to  Shoulder  and chest area will add sportscreme and K pad 4. COPD.Dulera twice a day. 5. Neuropsych: This patient is capable of making decisions on her own behalf. 6. Skin/Wound Care: Routine skin  checks 7. Fluids/Electrolytes/Nutrition: Strict I and O's. Labs nl   8. Hypertension. Lisinopril 10 mg daily. Monitor with increased mobility  LOS (Days) 15 A FACE TO FACE EVALUATION WAS PERFORMED  Seeley Southgate E 08/18/2014, 7:48 AM

## 2014-08-18 NOTE — Progress Notes (Signed)
Recreational Therapy Session Note  Patient Details  Name: Sherry Lambert MRN: [id number] Date of Birth: [birth year] Today's Date: 08/18/2014   Session 1: Pain: no c/o Skilled Therapeutic Interventions/Progress Updates: session focused on activity tolerance, ambulation with RW, dynamic standing balance, & RUE use.  Pt ambulated from room to therapy gym using RW with close supervision.  Pt stood to kick a ball without UE support using LLE to increase WBing & knee control on the RLE with contact guard assist.  Pt stood to toss/catch/bounce a ball using BUE's with close supervison-contact guard assist.  Pt progressed to alternating between kicking & tossing/catching/bouncing a ball with min assist.  Session 2: Pt participated in community reintegration/outing to grocery store ambulatory level using RW with close supervision-min assist, min cues for safety.  Goals/education focused on activity tolerance, functional mobility, dynamic standing balance, RUE use/safety, attending to the right, energy conservation, identification & negotiation of obstacles.  See outing goal sheet in shadow chart for full details.  08/18/2014, 9:46 AM

## 2014-08-19 ENCOUNTER — Ambulatory Visit (HOSPITAL_COMMUNITY): Payer: Medicare HMO | Admitting: Physical Therapy

## 2014-08-19 ENCOUNTER — Encounter (HOSPITAL_COMMUNITY): Payer: Medicare HMO | Admitting: Occupational Therapy

## 2014-08-19 ENCOUNTER — Inpatient Hospital Stay (HOSPITAL_COMMUNITY): Payer: Medicare HMO | Admitting: Physical Therapy

## 2014-08-19 NOTE — Discharge Summary (Signed)
NAMESPENSER, CONG NO.:  [id number]  MEDICAL RECORD NO.:  [id number]  LOCATION:  4W20C                        FACILITY:  [facility]  PHYSICIAN:  Lauraine Rinne, P.A.  DATE OF BIRTH:  1941/07/01  DATE OF ADMISSION:  08/03/2014 DATE OF DISCHARGE:  08/20/2014                              DISCHARGE SUMMARY   DISCHARGE DIAGNOSES: 1. Functional deficits secondary to the left thalamic infarction with     intracerebral hemorrhage. 2. Subcutaneous Lovenox initiated on August 03, 2014, for DVT     prophylaxis. 3. Chronic obstructive pulmonary disease. 4. Hypertension. 5. Escherichia coli urinary tract infection with Rocephin completed.  HISTORY OF PRESENT ILLNESS:  This is a 73 year old right-handed female with history of hypertension, diabetes mellitus, peripheral neuropathy, COPD, who was admitted with acute onset of numbness on the right side on July 28, 2014.  She had been taking aspirin daily prior to admission.  CT of the head showed an acute left thalamic hemorrhage near the posterior limb of the internal capsule with slight intraventricular extension.  MRI showed subacute left thalamic hemorrhage.  Carotid Dopplers with no ICA stenosis.  Echocardiogram with ejection fraction 58%, grade 1 diastolic dysfunction.  Blood pressure was elevated requiring immediate intervention with labetalol.  NIH score was 5.  The patient was not administered tPA secondary to hemorrhage.  Lovenox later initiated for DVT prophylaxis on August 03, 2014.  She is tolerating a regular diet.  She had been placed on Rocephin for 5 days for E. coli urinary tract infection.  Physical and occupational therapy ongoing. The patient was admitted for comprehensive rehab program.  PAST MEDICAL HISTORY:  See discharge diagnoses.  SOCIAL HISTORY:  Lives alone.  Independent prior to admission and driving.  Functional status upon admission to rehab services was moderate assist, stand, pivot  transfers; moderate assist, ambulate 50 feet with a hemi-walker; min-to-mod assist, activities of daily living.  PHYSICAL EXAMINATION:  VITAL SIGNS:  Blood pressure 135/51, pulse 76, temperature 98, respirations 17. GENERAL:  This was an alert female, oriented x3. LUNGS:  Clear to auscultation. CARDIAC:  Regular rate and rhythm. ABDOMEN:  Soft, nontender.  Good bowel sounds.  MENTAL STATUS: Cognition intact.  REHABILITATION HOSPITAL COURSE:  The patient was admitted to inpatient rehab services with therapies initiated on a 3-hour daily basis consisting of physical therapy, occupational therapy, and rehabilitation nursing.  The following issues were addressed during the patient's rehabilitation stay.  Pertaining to Ms. Findlay's left thalamic PLIC intracerebral hemorrhage remained stable.  She would follow up with Neurology Services.  The patient had been on aspirin prior to admission, discontinued due to intracerebral hemorrhage.  Subcutaneous Lovenox was initiated during her hospital stay on August 03, 2014, for DVT prophylaxis.  No bleeding episodes.  This was discontinued at the time of discharge.  She had no swallowing difficulties.  She remained on [facility] for history of COPD.  No shortness of breath.  Blood pressure was controlled on lisinopril and she would follow up with primary MD.  She had completed a course of Rocephin for E. coli urinary tract infection. The patient received weekly collaborative interdisciplinary team conferences to discuss estimated length of stay, family teaching,  and any barriers to her discharge.  She participated in full community outings, emphasis on functional transfers and ambulation as well as right side inattention, requiring min assist for negotiation into the [place] as well as navigating stairs.  Required overall supervision while ambulating in a busy environment.  Required supervision for safe sit to stand transfers and car transfers.  Activities  of daily living; she would ambulate to gather her belongings, needing standby assist to complete bathing, dressing.  Full family teaching was completed.  Plan was to be discharged to home with ongoing therapies dictated as per [facility].  DISCHARGE MEDICATIONS: 1. Lisinopril 10 mg p.o. daily. 2. Dulera 2 puffs twice daily. 3. Protonix 40 mg p.o. at bedtime.  DIET:  Regular.  SPECIAL INSTRUCTIONS:  The patient should follow up with Dr. Alysia Penna at the outpatient rehab center on September 20, 2014; Dr. Vertell Novak on September 07, 2014; Dr. [name], Neurology Services.     Lauraine Rinne, P.A.     DA/MEDQ  D:  08/19/2014  T:  08/19/2014  Job:  [id number]  cc:   Charlett Blake, M.D. Vertell Novak, MD Pramod P. Leonie Man, MD

## 2014-08-19 NOTE — Progress Notes (Signed)
Occupational Therapy Session Note  Patient Details  Name: Sherry Lambert MRN: [id number] Date of Birth: Jan 18, 1941  Today's Date: 08/19/2014 OT Individual Time: 5597-4163 and 1300-1330 OT Individual Time Calculation (min): 60 min and 30 min   Short Term Goals: Week 2:  OT Short Term Goal 1 (Week 2): Pt will perform UB dressing with supervision (fastening bra in lap and don over head)  OT Short Term Goal 2 (Week 2): Pt will complete LB dressing with min assist OT Short Term Goal 3 (Week 2): Pt will complete toilet transfer with min assist OT Short Term Goal 4 (Week 2): Pt will complete shower transfer with min assist OT Short Term Goal 5 (Week 2): Pt will complete simpe meal prep with min assist, utilizing RUE as gross assist  Skilled Therapeutic Interventions/Progress Updates:    1) Engaged in ADL retraining with focus on carryover of education, functional mobility with RW, standing balance, functional use of RUE, and increased awareness and attention to Rt.  Pt ambulated with RW to gather clothing prior to bathing at sit > stand level in shower. Educated on proper technique for transfer into walk-in shower with RW. Pt with improved awareness of RUE during bathing this session, however still dropped wash cloth 3 times when attempting to wash buttocks and BLE. Pt continues to require verbal cues for problem solving and sequencing with UB dressing and donning pants in sitting.  Pt supervision overall with all mobility and dressing tasks, however continues to require verbal cues for Rt attention, RUE placement, and safer techniques for dressing tasks.  Performed toilet transfer with supervision with RW with no cues for attention to RUE this session.  2) Pt seen for family education with son and daughter-in-law with focus on supervision level goals and how to provide supervision during mobility, appropriate subtle or question cues to improve safety and sequencing with mobility and self-care tasks.   Completed toilet transfer in bathroom with RW, requiring verbal cue to remove Rt hand from RW prior to sitting.  Educated on unsafe at this time to complete cooking tasks alone due to decreased sensation and proprioception in RUE, recommending close supervision during meal prep tasks if she were to prepare anything. Demonstrated walk-in shower transfer with simulated shower setup. Family reports no further questions and ready for d/c tomorrow.  Therapy Documentation Precautions:  Precautions Precautions: Fall Precaution Comments: impulsive, R inattention Restrictions Weight Bearing Restrictions: No Pain: Pain Assessment Pain Assessment: 0-10 Pain Score: 1  Pain Location: Knee Pain Orientation: Right;Anterior;Medial Pain Descriptors / Indicators: Sore Pain Onset: With Activity Pain Intervention(s): Rest Multiple Pain Sites: No  See FIM for current functional status  Therapy/Group: Individual Therapy  Simonne Come 08/19/2014, 10:27 AM

## 2014-08-19 NOTE — Progress Notes (Signed)
Social Work Patient ID: Sherry Lambert, female   DOB: 07-Jan-1941, 73 y.o.   MRN: [id number] Met with pt and daughter in-law who reports education went well and ready for discharge tomorrow.  Has all needed equipment will cancel bedside commode. Has all needed equipment and ready to go home tomorrow.

## 2014-08-19 NOTE — Progress Notes (Signed)
Recreational Therapy Discharge Summary Patient Details  Name: Sherry Lambert MRN: [id number] Date of Birth: 11/12/1940 Today's Date: 08/19/2014  Long term goals set: 2  Long term goals met: 2   Comments on progress toward goals: Pt has made great progress toward goals & is ready for discharge home with 24 hour supervision.  Pt continues to require verbal cues for safety, attending to the right & RUE.  Pt is anxious to get home & resume previously enjoyed activities as soon as able. Reasons for discharge: discharge from hospital 2Patient/family agrees with progress made and goals achieved: Yes  [name] 08/19/2014, 4:08 PM

## 2014-08-19 NOTE — Progress Notes (Signed)
Recreational Therapy Session Note  Patient Details  Name: Sherry Lambert MRN: [id number] Date of Birth: July 12, 1941 Today's Date: 08/19/2014  Pain: no c/o Skilled Therapeutic Interventions/Progress Updates: Session focused on discharge planning & kitchen safety.  Had planned to cook a full meal with pt that she had planned & purchased ingredients for but pt stated she wasn't really hungry & did not want to prepare meal as planned.  Session then focused on general kitchen safety & helping pt to identify potential activities or portions of activities for participation at home.  Pt stated understanding.  Therapy/Group: Individual Therapy   Evalene Vath 08/19/2014, 3:57 PM

## 2014-08-19 NOTE — Progress Notes (Signed)
Orthopedic Tech Progress Note Patient Details:  Sherry Lambert 1941/07/21 [id number]  Patient ID: Sherry Lambert, female   DOB: 04-07-1941, 73 y.o.   MRN: [id number] Called in advanced brace order; spoke with Dyann Kief, Byrd Rushlow 08/19/2014, 11:27 AM

## 2014-08-19 NOTE — Progress Notes (Signed)
Subjective/Complaints: "enjoying therapy"  No pain c/os .  Looking fwd to D/C  Review of Systems - Negative except as above Objective: Vital Signs: Blood pressure 120/35, pulse 80, temperature 97.5 F (36.4 C), temperature source Oral, resp. rate 18, height 5' (1.524 m), weight 62.279 kg (137 lb 4.8 oz), SpO2 96 %. No results found. Results for orders placed or performed during the hospital encounter of 08/03/14 (from the past 72 hour(s))  Glucose, capillary     Status: None   Collection Time: 08/16/14 12:06 PM  Result Value Ref Range   Glucose-Capillary 96 70 - 99 mg/dL  Glucose, capillary     Status: None   Collection Time: 08/16/14  5:10 PM  Result Value Ref Range   Glucose-Capillary 87 70 - 99 mg/dL  Glucose, capillary     Status: Abnormal   Collection Time: 08/16/14  8:40 PM  Result Value Ref Range   Glucose-Capillary 131 (H) 70 - 99 mg/dL   Comment 1 Notify RN   Creatinine, serum     Status: Abnormal   Collection Time: 08/17/14  5:00 AM  Result Value Ref Range   Creatinine, Ser 0.72 0.50 - 1.10 mg/dL   GFR calc non Af Amer 84 (L) >90 mL/min   GFR calc Af Amer >90 >90 mL/min    Comment: (NOTE) The eGFR has been calculated using the CKD EPI equation. This calculation has not been validated in all clinical situations. eGFR's persistently <90 mL/min signify possible Chronic Kidney Disease.   Glucose, capillary     Status: Abnormal   Collection Time: 08/17/14  6:33 AM  Result Value Ref Range   Glucose-Capillary 130 (H) 70 - 99 mg/dL   Comment 1 Notify RN   Glucose, capillary     Status: Abnormal   Collection Time: 08/17/14 11:53 AM  Result Value Ref Range   Glucose-Capillary 117 (H) 70 - 99 mg/dL  Glucose, capillary     Status: None   Collection Time: 08/17/14  4:41 PM  Result Value Ref Range   Glucose-Capillary 91 70 - 99 mg/dL  Glucose, capillary     Status: Abnormal   Collection Time: 08/17/14  8:42 PM  Result Value Ref Range   Glucose-Capillary 150 (H) 70 -  99 mg/dL  Glucose, capillary     Status: Abnormal   Collection Time: 08/18/14  6:51 AM  Result Value Ref Range   Glucose-Capillary 110 (H) 70 - 99 mg/dL  Glucose, capillary     Status: Abnormal   Collection Time: 08/18/14 12:09 PM  Result Value Ref Range   Glucose-Capillary 115 (H) 70 - 99 mg/dL   Comment 1 Notify RN   Glucose, capillary     Status: Abnormal   Collection Time: 08/18/14  4:45 PM  Result Value Ref Range   Glucose-Capillary 146 (H) 70 - 99 mg/dL     HEENT: normal, Cardio: RRR and no murmur Resp: CTA B/L and unlabored GI: BS positive and NT,ND Extremity:  No Edema Skin:   Intact, no evidence of rash Neuro: Alert/Oriented, Cranial Nerve II-XII normal, Abnormal Sensory absent LT in Right hand and foot, Musc/Skel: Full AROM without pain in BUE Gen NAD   Assessment/Plan: 1. 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 Physiatrist is providing close team supervision and 24 hour management of active medical problems listed below. Physiatrist and rehab team continue to assess barriers to  discharge/monitor patient progress toward functional and medical goals. Plan D/C in am .FIM: FIM - Bathing Bathing Steps Patient Completed: Chest, Right Arm, Left Arm, Abdomen, Front perineal area, Buttocks, Right upper leg, Left upper leg, Right lower leg (including foot), Left lower leg (including foot) Bathing: 5: Supervision: Safety issues/verbal cues  FIM - Upper Body Dressing/Undressing Upper body dressing/undressing steps patient completed: Thread/unthread right sleeve of pullover shirt/dresss, Thread/unthread left sleeve of pullover shirt/dress, Put head through opening of pull over shirt/dress, Pull shirt over trunk, Thread/unthread right bra strap, Thread/unthread left bra strap, Hook/unhook bra Upper body dressing/undressing: 5: Supervision:  Safety issues/verbal cues FIM - Lower Body Dressing/Undressing Lower body dressing/undressing steps patient completed: Thread/unthread right underwear leg, Thread/unthread left underwear leg, Pull underwear up/down, Thread/unthread right pants leg, Thread/unthread left pants leg, Pull pants up/down, Don/Doff left shoe, Don/Doff right shoe, Fasten/unfasten right shoe Lower body dressing/undressing: 5: Set-up assist to: Don/Doff AFO/prosthesis/orthosis  FIM - Toileting Toileting steps completed by patient: Adjust clothing prior to toileting, Performs perineal hygiene Toileting Assistive Devices: Grab bar or rail for support Toileting: 3: Mod-Patient completed 2 of 3 steps  FIM - Radio producer Devices: Grab bars Toilet Transfers: 4-To toilet/BSC: Min A (steadying Pt. > 75%), 4-From toilet/BSC: Min A (steadying Pt. > 75%)  FIM - Bed/Chair Transfer Bed/Chair Transfer Assistive Devices: Walker, Arm rests Bed/Chair Transfer: 5: Bed > Chair or W/C: Supervision (verbal cues/safety issues), 5: Chair or W/C > Bed: Supervision (verbal cues/safety issues)  FIM - Locomotion: Wheelchair Distance: 200 Locomotion: Wheelchair: 1: Total Assistance/staff pushes wheelchair (Pt<25%) FIM - Locomotion: Ambulation Locomotion: Ambulation Assistive Devices: Administrator, Other (comment), Orthosis Ambulation/Gait Assistance: 5: Supervision Locomotion: Ambulation: 5: Travels 150 ft or more with supervision/safety issues  Comprehension Comprehension Mode: Auditory Comprehension: 6-Follows complex conversation/direction: With extra time/assistive device  Expression Expression Mode: Verbal Expression: 6-Expresses complex ideas: With extra time/assistive device  Social Interaction Social Interaction: 6-Interacts appropriately with others with medication or extra time (anti-anxiety, antidepressant).  Problem Solving Problem Solving: 5-Solves complex 90% of the time/cues < 10% of  the time  Memory Memory: 5-Recognizes or recalls 90% of the time/requires cueing < 10% of the time  Medical Problem List and Plan: 1. 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 DVT Prophylaxis/Anticoagulation: Lovenox initiated 08/03/2014 for DVT prophylaxis. Monitor platelet counts of any signs of bleeding 3. Pain Management: Tylenol as needed, Left sided neck pain degenerative with decreased mobility, no clearcut radicular symptoms but may radiate to  Shoulder  and chest area will add sportscreme and K pad 4. COPD.Dulera twice a day. 5. Neuropsych: This patient is capable of making decisions on her own behalf. 6. Skin/Wound Care: Routine skin checks 7. Fluids/Electrolytes/Nutrition: Strict I and O's. Labs nl   8. Hypertension. Lisinopril 10 mg daily. Monitor with increased mobility  LOS (Days) 16 A FACE TO FACE EVALUATION WAS PERFORMED  Jacob Chamblee E 08/19/2014, 8:19 AM

## 2014-08-19 NOTE — Progress Notes (Signed)
Occupational Therapy Discharge Summary  Patient Details  Name: Sherry Lambert MRN: [id number] Date of Birth: 1941/02/08   Patient has met 30 of 75 long term goals due to improved balance, postural control, functional use of  RIGHT upper extremity, improved attention, improved awareness and improved coordination.  Patient to discharge at overall Supervision level.  Patient's care partner is independent to provide the necessary cognitive assistance at discharge.  Pt's son and daughter-in-law present for family education and plan to educate hired caregiver.    Reasons goals not met: Pt did not achieve Mod I with toileting and toilet transfers, will require supervision with toileting and transfers upon d/c.    Recommendation:  Patient will benefit from ongoing skilled OT services in home health setting to continue to advance functional skills in the area of BADL, iADL and Reduce care partner burden.  Equipment: No equipment provided  Reasons for discharge: treatment goals met and discharge from hospital  Patient/family agrees with progress made and goals achieved: Yes  OT Discharge Precautions/Restrictions  Precautions Precautions: Fall Precaution Comments: impulsive, R inattention General   Vital Signs Therapy Vitals Temp: 98.3 F (36.8 C) Temp Source: Oral Pulse Rate: 93 Resp: 18 BP: 137/61 mmHg Patient Position (if appropriate): Sitting Oxygen Therapy SpO2: 95 % O2 Device: Not Delivered Pain  Pt with no c/o pain ADL  See FIM Vision/Perception  Vision- History Baseline Vision/History: No visual deficits Patient Visual Report: No change from baseline Vision- Assessment Vision Assessment?: Yes Eye Alignment: Within Functional Limits Ocular Range of Motion: Within Functional Limits Alignment/Gaze Preference: Within Defined Limits  Cognition Overall Cognitive Status: Within Functional Limits for tasks assessed Arousal/Alertness: Awake/alert Orientation Level: Oriented  X4 Sensation Sensation Light Touch: Impaired Detail Light Touch Impaired Details: Impaired RLE;Impaired RUE Stereognosis: Not tested Proprioception: Impaired Detail Proprioception Impaired Details: Absent RUE;Impaired RLE Additional Comments: Pt able to sense presence of light touch in RLE (proximal > distal) but unable to localize. Proprioception impaired (but no longer absent) in RLE. Coordination Gross Motor Movements are Fluid and Coordinated: No Fine Motor Movements are Fluid and Coordinated: No Heel Shin Test:  Speed, excursion of movement grossly equal bilaterally. 9 Hole Peg Test: decreased speed and inability to grade pressure resulting in multiple dropped pegs - not formally assessed Extremity/Trunk Assessment RUE Assessment RUE Assessment: Within Functional Limits (ROM WFL, strength grossly 4/5) LUE Assessment LUE Assessment: Within Functional Limits  See FIM for current functional status  Sherry Lambert, [facility] 08/19/2014, 4:24 PM

## 2014-08-19 NOTE — Discharge Instructions (Signed)
Inpatient Rehab Discharge Instructions  Sherry Lambert Discharge date and time: No discharge date for patient encounter.   Activities/Precautions/ Functional Status: Activity: activity as tolerated Diet: diabetic diet Wound Care: none needed Functional status:  ___ No restrictions     ___ Walk up steps independently ___ 24/7 supervision/assistance   ___ Walk up steps with assistance ___ Intermittent supervision/assistance  ___ Bathe/dress independently ___ Walk with walker     ___ Bathe/dress with assistance ___ Walk Independently    ___ Shower independently _x STROKE/TIA DISCHARGE INSTRUCTIONS SMOKING Cigarette smoking nearly doubles your risk of having a stroke & is the single most alterable risk factor  If you smoke or have smoked in the last 12 months, you are advised to quit smoking for your health.  Most of the excess cardiovascular risk related to smoking disappears within a year of stopping.  Ask you doctor about anti-smoking medications  [place] Quit Line: 1-800-QUIT NOW  Free Smoking Cessation Classes [phone]  CHOLESTEROL Know your levels; limit fat & cholesterol in your diet  Lipid Panel     Component Value Date/Time   CHOL 161 07/30/2014 0205   TRIG 72 07/30/2014 0205   HDL 64 07/30/2014 0205   CHOLHDL 2.5 07/30/2014 0205   VLDL 14 07/30/2014 0205   LDLCALC 83 07/30/2014 0205      Many patients benefit from treatment even if their cholesterol is at goal.  Goal: Total Cholesterol (CHOL) less than 160  Goal:  Triglycerides (TRIG) less than 150  Goal:  HDL greater than 40  Goal:  LDL (LDLCALC) less than 100   BLOOD PRESSURE American Stroke Association blood pressure target is less that 120/80 mm/Hg  Your discharge blood pressure is:  BP: 140/66 mmHg  Monitor your blood pressure  Limit your salt and alcohol intake  Many individuals will require more than one medication for high blood pressure  DIABETES (A1c is a blood sugar average for last 3 months)  Goal HGBA1c is under 7% (HBGA1c is blood sugar average for last 3 months)  Diabetes:    Lab Results  Component Value Date   HGBA1C 6.6* 07/30/2014     Your HGBA1c can be lowered with medications, healthy diet, and exercise.  Check your blood sugar as directed by your physician  Call your physician if you experience unexplained or low blood sugars.  PHYSICAL ACTIVITY/REHABILITATION Goal is 30 minutes at least 4 days per week  Activity: Increase activity slowly, Therapies: Physical Therapy: Home Health Return to work:   Activity decreases your risk of heart attack and stroke and makes your heart stronger.  It helps control your weight and blood pressure; helps you relax and can improve your mood.  Participate in a regular exercise program.  Talk with your doctor about the best form of exercise for you (dancing, walking, swimming, cycling).  DIET/WEIGHT Goal is to maintain a healthy weight  Your discharge diet is: Diet regular  liquids Your height is:  Height: 5' (152.4 cm) Your current weight is: Weight: 63.05 kg (139 lb) Your Body Mass Index (BMI) is:     Following the type of diet specifically designed for you will help prevent another stroke.  Your goal weight range is:    Your goal Body Mass Index (BMI) is 19-24.  Healthy food habits can help reduce 3 risk factors for stroke:  High cholesterol, hypertension, and excess weight.  RESOURCES Stroke/Support Group:  Call [phone]   STROKE EDUCATION PROVIDED/REVIEWED AND GIVEN TO PATIENT Stroke warning  signs and symptoms How to activate emergency medical system (call 911). Medications prescribed at discharge. Need for follow-up after discharge. Personal risk factors for stroke. Pneumonia vaccine given:  Flu vaccine given:  My questions have been answered, the writing is legible, and I understand these instructions.  I will adhere to these goals & educational materials that have been provided to me after my discharge from  the hospital.    __ Walk with assistance    ___ Shower with assistance ___ No alcohol     ___ Return to work/school ________  Special Instructions:     COMMUNITY REFERRALS UPON DISCHARGE:    Home Health:   PT, [place], [place] FYTWK:[phone] Date of last service:08/20/2014  Medical Equipment/Items Ordered:[facility] HAS NEEDED EQUIPMENT     GENERAL COMMUNITY RESOURCES FOR PATIENT/FAMILY: Support Groups:[facility] SUPPORT GROUP   My questions have been answered and I understand these instructions. I will adhere to these goals and the provided educational materials after my discharge from the hospital.  Patient/Caregiver Signature _______________________________ Date __________  Clinician Signature _______________________________________ Date __________  Please bring this form and your medication list with you to all your follow-up doctor's appointments.

## 2014-08-19 NOTE — Progress Notes (Signed)
Physical Therapy Discharge Summary  Patient Details  Name: Sherry Lambert MRN: [id number] Date of Birth: [birth year]  Today's Date: 08/21/2014 PT Individual Time: [id number] and [id number] PT Individual Time Calculation (min): 61 min and 29 min   Patient has met 12 of 12 long term goals due to improved activity tolerance, improved balance, improved postural control, increased strength, ability to compensate for deficits, functional use of  right upper extremity and right lower extremity, improved attention and improved awareness.  Patient to discharge at an ambulatory level Supervision.   Patient's care partner is independent to provide the necessary physical and cognitive assistance at discharge.  Reasons goals not met: N/A; all goals met.  Recommendation:  Patient will benefit from ongoing skilled PT services in home health setting to continue to advance safe functional mobility, address ongoing impairments in stability/independence with functional mobility, increased attention to RUE/RLE during functional activities/mobility, and to minimize fall risk.  Equipment: right Foot Up brace; leather toe cap on right shoe  Reasons for discharge: treatment goals met and discharge from hospital  Patient/family agrees with progress made and goals achieved: Yes   Skilled Therapeutic Interventions/Progress Updates Treatment Session 1: Discharge evaluation initiated; see below for detailed description of assistance required with functional ambulation, transfers, stair negotiation, w/c mobility, and bed mobility. In rehab apartment, pt performed floor transfer with min A, tactile cueing at L knee for increased weight bearing during transitioned from tall kneeling (bilat UE support at couch) > seated on couch. Session ended in pt room, where pt was left seated in w/c with R half lap tray on and all needs within reach.  Treatment Session 2: Pt received seated in w/c accompanied by son and daughter-in-law.  Pt agreeable to therapy. Session focused on completion of hands-on family training with son.Therapist explained, demonstrated safe supervision and appropriate cueing with all of the following: negotiation of 3 stairs with 2 rails (to simulate home entrance), forward-facing with step-to pattern and supervision, min cueing for sequencing; simulated car transfer with rolling walker and supervision/setup; functional ambulation x200' in controlled, home, and community environments with rolling walker, R hand orthosis, R Foot Up brace, and supervision; negotiation of standard curb step and standard ramp with rolling walker and supervision, manual stabilization of rolling walker. Son gave effective return demonstration of safe supervision and appropriate cueing with all described aspects of functional mobility.   Emphasis given to strong recommendation that pt is discharging at supervision level and therefore should only ambulate when trained family member is within arm's length of pt. Reiterated recommendation for 24/7 supervision secondary to R inattention. Pt and son verbalized understanding and were both in full agreement. Pt and son verbalized feeling safe and comfortable performing/assisting with all aspects of functional mobility at discharge.   PT Discharge Precautions/Restrictions Precautions Precautions: Fall Precaution Comments: impulsive, R inattention Restrictions Weight Bearing Restrictions: No  Pain Pain Assessment Pain Assessment: 0-10 Pain Score: 1  Pain Location: Knee Pain Orientation: Right;Anterior;Medial Pain Descriptors / Indicators: Sore Pain Onset: With Activity Pain Intervention(s): Rest Multiple Pain Sites: No Vision/Perception  Vision - Assessment Eye Alignment: Within Functional Limits Ocular Range of Motion: Within Functional Limits Alignment/Gaze Preference: Within Defined Limits  Inattention to Rt side of body. Cognition Orientation Level: Oriented  X4 Sensation Sensation Light Touch: Impaired Detail Light Touch Impaired Details: Impaired RLE;Impaired RUE Stereognosis: Not tested Proprioception: Impaired Detail Proprioception Impaired Details: Absent RUE;Impaired RLE Additional Comments: Pt able to sense presence of light touch in RLE (proximal > distal) but unable  to localize. Proprioception impaired (but no longer absent) in RLE. Coordination Gross Motor Movements are Fluid and Coordinated: No Fine Motor Movements are Fluid and Coordinated: No Heel Shin Test:  Speed, excursion of movement grossly equal bilaterally. Motor  Motor Motor - Discharge Observations: R hemiplegia  Mobility Bed Mobility Bed Mobility: Supine to Sit;Sit to Supine Supine to Sit: 6: Modified independent (Device/Increase time) Sit to Supine: 6: Modified independent (Device/Increase time) Transfers Transfers: Yes Sit to Stand: 5: Supervision;From bed;From chair/3-in-1 Sit to Stand Details: Verbal cues for precautions/safety Sit to Stand Details (indicate cue type and reason): Min cueing for safe RUE positioning. Stand to Sit: 5: Supervision;To bed;To chair/3-in-1 Stand to Sit Details (indicate cue type and reason): Verbal cues for precautions/safety Stand to Sit Details: Min cueing for safe RUE positioning. Stand Pivot Transfers: 5: Supervision Stand Pivot Transfer Details (indicate cue type and reason): See above. Locomotion  Ambulation Ambulation: Yes Ambulation/Gait Assistance: 5: Supervision Ambulation Distance (Feet): 250 Feet Assistive device: Rolling walker;Other (Comment) (R hand orthosis. R Foot Up brace) Gait Gait: Yes Gait Pattern: Impaired Gait Pattern: Decreased dorsiflexion - right;Decreased weight shift to left;Left flexed knee in stance;Step-through pattern Stairs / Additional Locomotion Stairs Assistance Details: Verbal cues for sequencing Stairs Assistance Details (indicate cue type and reason): Pt engotiated 12 steps forward with  biilat rails, step-to pattern, and supervision, subtle cueing for sequencing with effective return demonstration. Number of Stairs: 12 Ramp: 5: Supervision (with rolling walker) Curb: 5: Supervision (with rolling walker) Wheelchair Mobility Wheelchair Mobility: Yes Wheelchair Assistance: 6: Modified independent (Device/Increase time) Wheelchair Propulsion: Left upper extremity;Left lower extremity Wheelchair Parts Management: Independent Distance: 200  Trunk/Postural Assessment  Cervical Assessment Cervical Assessment: Within Functional Limits Thoracic Assessment Thoracic Assessment: Exceptions to WFL (kyphosis) Lumbar Assessment Lumbar Assessment: Exceptions to [facility] (pelvic tilt more neutral than on evaluation; however, still  slightly posterior) Postural Control Postural Control: Deficits on evaluation Righting Reactions: In standing, pt demonstrates effective hip strategy with LOB in all directions and effective ankle strategy with posterior LOB. No stepping strategy present with LOB in all directions.  Balance Balance Balance Assessed: Yes Dynamic Standing Balance Dynamic Standing - Balance Support: No upper extremity supported;During functional activity Dynamic Standing - Level of Assistance: 6: Modified independent (Device/Increase time) Extremity Assessment  RLE Assessment RLE Assessment: Exceptions to [facility] RLE Strength RLE Overall Strength: Deficits Right Hip Flexion: 4/5 Right Knee Flexion: 5/5 Right Knee Extension: 5/5 Right Ankle Dorsiflexion: 4/5 Right Ankle Plantar Flexion: 5/5 LLE Assessment LLE Assessment: Within Functional Limits  See FIM for current functional status  Dayane Hillenburg, Malva Cogan 08/21/2014, 2:13 PM

## 2014-08-20 MED ORDER — MOMETASONE FURO-FORMOTEROL FUM 100-5 MCG/ACT IN AERO: 2.0000 | INHALATION_SPRAY | Freq: Two times a day (BID) | RESPIRATORY_TRACT | Status: DC

## 2014-08-20 MED ORDER — LISINOPRIL 10 MG PO TABS: 10.0000 mg | ORAL_TABLET | Freq: Every day | ORAL | Status: DC

## 2014-08-20 NOTE — Progress Notes (Signed)
Social Work Discharge Note Discharge Note  The overall goal for the admission was met for:   Discharge location: [place] 24 HR SUPERVISION   Length of Stay: Yes-17 DAYS  Discharge activity level: Yes-SUPERVISION LEVEL  Home/community participation: Yes  Services provided included: MD, RD, PT, OT, SLP, RN, CM, TR, Pharmacy and SW  Financial Services: Private Insurance: [place]  Follow-up services arranged: Home Health: [place] [facility]-PT,OT,RN and Patient/Family has no preference for HH/DME agencies  Comments (or additional information):FAMILY [place] HIRED AIDE TO [place]. PT WILL HAVE 24 HR SUPERVISION AT HOME.  Patient/Family verbalized understanding of follow-up arrangements: Yes  Individual responsible for coordination of the follow-up plan: SELF & DWIGHT-SON  Confirmed correct DME delivered: Elease Hashimoto 08/20/2014    Elease Hashimoto

## 2014-08-20 NOTE — Progress Notes (Signed)
Subjective/Complaints: Some nasal drainage o/w no c/os  Review of Systems - Negative except as above Objective: Vital Signs: Blood pressure 122/62, pulse 82, temperature 97.9 F (36.6 C), temperature source Oral, resp. rate 17, height 5' (1.524 m), weight 62.279 kg (137 lb 4.8 oz), SpO2 97 %. No results found. Results for orders placed or performed during the hospital encounter of 08/03/14 (from the past 72 hour(s))  Glucose, capillary     Status: Abnormal   Collection Time: 08/17/14 11:53 AM  Result Value Ref Range   Glucose-Capillary 117 (H) 70 - 99 mg/dL  Glucose, capillary     Status: None   Collection Time: 08/17/14  4:41 PM  Result Value Ref Range   Glucose-Capillary 91 70 - 99 mg/dL  Glucose, capillary     Status: Abnormal   Collection Time: 08/17/14  8:42 PM  Result Value Ref Range   Glucose-Capillary 150 (H) 70 - 99 mg/dL  Glucose, capillary     Status: Abnormal   Collection Time: 08/18/14  6:51 AM  Result Value Ref Range   Glucose-Capillary 110 (H) 70 - 99 mg/dL  Glucose, capillary     Status: Abnormal   Collection Time: 08/18/14 12:09 PM  Result Value Ref Range   Glucose-Capillary 115 (H) 70 - 99 mg/dL   Comment 1 Notify RN   Glucose, capillary     Status: Abnormal   Collection Time: 08/18/14  4:45 PM  Result Value Ref Range   Glucose-Capillary 146 (H) 70 - 99 mg/dL     HEENT: normal, Cardio: RRR and no murmur Resp: CTA B/L and unlabored GI: BS positive and NT,ND Extremity:  No Edema Skin:   Intact, no evidence of rash Neuro: Alert/Oriented, Cranial Nerve II-XII normal, Abnormal Sensory absent LT in Right hand and foot, Musc/Skel: Full AROM without pain in BUE Gen NAD   Assessment/Plan: 1. 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  Stable for D/C today F/u PCP in 1-2 weeks F/u PM&R 3 weeks See D/C summary See D/C instructions .FIM: FIM - Bathing Bathing Steps Patient Completed: Chest, Right Arm, Left Arm,  Abdomen, Front perineal area, Buttocks, Right upper leg, Left upper leg, Right lower leg (including foot), Left lower leg (including foot) Bathing: 5: Supervision: Safety issues/verbal cues  FIM - Upper Body Dressing/Undressing Upper body dressing/undressing steps patient completed: Thread/unthread right sleeve of pullover shirt/dresss, Thread/unthread left sleeve of pullover shirt/dress, Put head through opening of pull over shirt/dress, Pull shirt over trunk, Thread/unthread right bra strap, Thread/unthread left bra strap, Hook/unhook bra Upper body dressing/undressing: 5: Supervision: Safety issues/verbal cues FIM - Lower Body Dressing/Undressing Lower body dressing/undressing steps patient completed: Thread/unthread right underwear leg, Thread/unthread left underwear leg, Pull underwear up/down, Thread/unthread right pants leg, Thread/unthread left pants leg, Pull pants up/down, Don/Doff left shoe, Don/Doff right shoe, Fasten/unfasten right shoe Lower body dressing/undressing: 5: Set-up assist to: Don/Doff AFO/prosthesis/orthosis  FIM - Toileting Toileting steps completed by patient: Adjust clothing prior to toileting, Performs perineal hygiene, Adjust clothing after toileting Toileting Assistive Devices: Grab bar or rail for support Toileting: 5: Supervision: Safety issues/verbal cues  FIM - Radio producer Devices: Grab bars, Insurance account manager Transfers: 5-To toilet/BSC: Supervision (verbal cues/safety issues), 5-From toilet/BSC: Supervision (verbal cues/safety issues)  FIM - Control and instrumentation engineer Devices: Walker, Arm rests Bed/Chair Transfer: 5: Bed > Chair or W/C: Supervision (verbal cues/safety issues), 5: Chair or W/C > Bed: Supervision (verbal cues/safety issues), 6: Supine > Sit: No assist,  6: Sit > Supine: No assist  FIM - Locomotion: Wheelchair Distance: 200 Locomotion: Wheelchair: 6: Travels 150 ft or more, turns around,  maneuvers to table, bed or toilet, negotiates 3% grade: maneuvers on rugs and over door sills independently FIM - Locomotion: Ambulation Locomotion: Ambulation Assistive Devices: Walker - Rolling, Other (comment), Orthosis (R hand orthosis, R Foot Up brace) Ambulation/Gait Assistance: 5: Supervision Locomotion: Ambulation: 5: Travels 150 ft or more with supervision/safety issues  Comprehension Comprehension Mode: Auditory Comprehension: 6-Follows complex conversation/direction: With extra time/assistive device  Expression Expression Mode: Verbal Expression: 6-Expresses complex ideas: With extra time/assistive device  Social Interaction Social Interaction: 6-Interacts appropriately with others with medication or extra time (anti-anxiety, antidepressant).  Problem Solving Problem Solving: 5-Solves complex 90% of the time/cues < 10% of the time  Memory Memory: 5-Recognizes or recalls 90% of the time/requires cueing < 10% of the time  Medical Problem List and Plan: 1. 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 DVT Prophylaxis/Anticoagulation: Lovenox initiated 08/03/2014 for DVT prophylaxis. Monitor platelet counts of any signs of bleeding 3. Pain Management: Tylenol as needed, Left sided neck pain degenerative with decreased mobility, no clearcut radicular symptoms but may radiate to  Shoulder  and chest area will add sportscreme and K pad 4. COPD.Dulera twice a day. 5. Neuropsych: This patient is capable of making decisions on her own behalf. 6. Skin/Wound Care: Routine skin checks 7. Fluids/Electrolytes/Nutrition: Strict I and O's. Labs nl   8. Hypertension. Lisinopril 10 mg daily. Monitor with increased mobility  LOS (Days) 17 A FACE TO FACE EVALUATION WAS PERFORMED  KIRSTEINS,ANDREW E 08/20/2014, 8:03 AM

## 2014-09-07 ENCOUNTER — Encounter: Payer: Self-pay | Admitting: Internal Medicine

## 2014-09-07 ENCOUNTER — Ambulatory Visit (INDEPENDENT_AMBULATORY_CARE_PROVIDER_SITE_OTHER): Payer: Medicare HMO | Admitting: Internal Medicine

## 2014-09-07 VITALS — BP 142/88 | HR 93 | Temp 97.9°F | Resp 18 | Ht 60.0 in | Wt 132.4 lb

## 2014-09-07 DIAGNOSIS — J438 Other emphysema: Secondary | ICD-10-CM

## 2014-09-07 DIAGNOSIS — E119 Type 2 diabetes mellitus without complications: Secondary | ICD-10-CM

## 2014-09-07 DIAGNOSIS — I10 Essential (primary) hypertension: Secondary | ICD-10-CM

## 2014-09-07 MED ORDER — LISINOPRIL 10 MG PO TABS: 10.0000 mg | ORAL_TABLET | Freq: Every day | ORAL | Status: DC

## 2014-09-07 MED ORDER — FLUTICASONE-SALMETEROL 100-50 MCG/DOSE IN AEPB: 1.0000 | INHALATION_SPRAY | Freq: Two times a day (BID) | RESPIRATORY_TRACT | Status: DC

## 2014-09-07 NOTE — Progress Notes (Signed)
Pre visit review using our clinic review tool, if applicable. No additional management support is needed unless otherwise documented below in the visit note. 

## 2014-09-07 NOTE — Assessment & Plan Note (Signed)
Continue advair, doing very well with this regimen and almost no SOB with exertion.

## 2014-09-07 NOTE — Assessment & Plan Note (Signed)
Spoke with patient about the fact that she does technically have diabetes but no treatment is indicated at this time. Will continue to monitor HgA1c and encouraged her to work on diet (she is already). Likely the weight loss will be sufficient treatment. She is on ACE-I without problems. No complications or neuropathy.

## 2014-09-07 NOTE — Progress Notes (Signed)
   Subjective:    Patient ID: Sherry Lambert, female    DOB: 1940-09-15, 74 y.o.   MRN: [id number]  HPI The patient is a 74 YO female who is coming in to establish care. She did have a stroke before Thanksgiving and is still doing therapy at home. She still has impaired feeling in her right side but is able to do all her ADLs and stay independently. She denies headache, chest pains, new numbness, tingling, weakness, speech changes. She is doing well overall and has not fallen. She does have a walker which she uses when going out and in the middle of the night.   Review of Systems  Constitutional: Negative for fever, chills, activity change, appetite change, fatigue and unexpected weight change.  HENT: Negative.   Respiratory: Negative for cough, chest tightness, shortness of breath and wheezing.   Cardiovascular: Negative for chest pain, palpitations and leg swelling.  Gastrointestinal: Negative for nausea, abdominal pain, diarrhea, constipation and abdominal distention.  Musculoskeletal: Positive for gait problem. Negative for myalgias, back pain and arthralgias.  Skin: Negative.   Neurological: Positive for numbness. Negative for dizziness, seizures, speech difficulty, weakness, light-headedness and headaches.      Objective:   Physical Exam  Constitutional: She is oriented to person, place, and time. She appears well-developed and well-nourished.  HENT:  Head: Normocephalic and atraumatic.  Eyes: EOM are normal.  Neck: Normal range of motion.  Cardiovascular: Normal rate and regular rhythm.   Pulmonary/Chest: Breath sounds normal. No respiratory distress. She has no wheezes. She has no rales.  Abdominal: Soft. Bowel sounds are normal. She exhibits no distension. There is no tenderness. There is no rebound.  Musculoskeletal: She exhibits no edema.  Neurological: She is alert and oriented to person, place, and time. Coordination abnormal.  Uses walker, abnormal sensation right extremity  upper and lower. Strength 5/5 upper and lower extremity bilaterally.  Skin: Skin is warm and dry.   Filed Vitals:   09/07/14 0928  BP: 142/88  Pulse: 93  Temp: 97.9 F (36.6 C)  TempSrc: Oral  Resp: 18  Height: 5' (1.524 m)  Weight: 132 lb 6.4 oz (60.056 kg)  SpO2: 95%      Assessment & Plan:

## 2014-09-07 NOTE — Assessment & Plan Note (Signed)
BP doing well on small dose of ACE-I.

## 2014-09-07 NOTE — Patient Instructions (Signed)
We will see you back in March for your physical. Continue the good work with physical therapy and exercise.   If you have any problems or questions please feel free to call us back sooner.   Exercise to Stay Healthy Exercise helps you become and stay healthy. EXERCISE IDEAS AND TIPS Choose exercises that:  You enjoy.  Fit into your day. You do not need to exercise really hard to be healthy. You can do exercises at a slow or medium level and stay healthy. You can:  Stretch before and after working out.  Try yoga, Pilates, or tai chi.  Lift weights.  Walk fast, swim, jog, run, climb stairs, bicycle, dance, or rollerskate.  Take aerobic classes. Exercises that burn about 150 calories:  Running 1  miles in 15 minutes.  Playing volleyball for 45 to 60 minutes.  Washing and waxing a car for 45 to 60 minutes.  Playing touch football for 45 minutes.  Walking 1  miles in 35 minutes.  Pushing a stroller 1  miles in 30 minutes.  Playing basketball for 30 minutes.  Raking leaves for 30 minutes.  Bicycling 5 miles in 30 minutes.  Walking 2 miles in 30 minutes.  Dancing for 30 minutes.  Shoveling snow for 15 minutes.  Swimming laps for 20 minutes.  Walking up stairs for 15 minutes.  Bicycling 4 miles in 15 minutes.  Gardening for 30 to 45 minutes.  Jumping rope for 15 minutes.  Washing windows or floors for 45 to 60 minutes. Document Released: 09/22/2010 Document Revised: 11/12/2011 Document Reviewed: 09/22/2010 [facility] Patient Information 2015 [place], [place]. This information is not intended to replace advice given to you by your health care provider. Make sure you discuss any questions you have with your health care provider.

## 2014-09-10 DIAGNOSIS — J449 Chronic obstructive pulmonary disease, unspecified: Secondary | ICD-10-CM | POA: Diagnosis not present

## 2014-09-10 DIAGNOSIS — I10 Essential (primary) hypertension: Secondary | ICD-10-CM | POA: Diagnosis not present

## 2014-09-10 DIAGNOSIS — I69351 Hemiplegia and hemiparesis following cerebral infarction affecting right dominant side: Secondary | ICD-10-CM | POA: Diagnosis not present

## 2014-09-10 DIAGNOSIS — I739 Peripheral vascular disease, unspecified: Secondary | ICD-10-CM | POA: Diagnosis not present

## 2014-09-20 ENCOUNTER — Inpatient Hospital Stay: Payer: Medicare HMO | Admitting: Physical Medicine & Rehabilitation

## 2014-10-02 ENCOUNTER — Encounter (HOSPITAL_COMMUNITY): Payer: Self-pay | Admitting: *Deleted

## 2014-10-02 ENCOUNTER — Emergency Department (HOSPITAL_COMMUNITY): Payer: Medicare HMO

## 2014-10-02 ENCOUNTER — Emergency Department (HOSPITAL_COMMUNITY)
Admission: EM | Admit: 2014-10-02 | Discharge: 2014-10-03 | Disposition: A | Discharge status: Home / Self Care | Payer: Medicare HMO | Attending: Emergency Medicine | Admitting: Emergency Medicine

## 2014-10-02 DIAGNOSIS — Z8719 Personal history of other diseases of the digestive system: Secondary | ICD-10-CM | POA: Insufficient documentation

## 2014-10-02 DIAGNOSIS — J439 Emphysema, unspecified: Secondary | ICD-10-CM | POA: Diagnosis not present

## 2014-10-02 DIAGNOSIS — Z87891 Personal history of nicotine dependence: Secondary | ICD-10-CM | POA: Insufficient documentation

## 2014-10-02 DIAGNOSIS — Z79899 Other long term (current) drug therapy: Secondary | ICD-10-CM | POA: Diagnosis not present

## 2014-10-02 DIAGNOSIS — I10 Essential (primary) hypertension: Secondary | ICD-10-CM | POA: Insufficient documentation

## 2014-10-02 DIAGNOSIS — R200 Anesthesia of skin: Secondary | ICD-10-CM | POA: Insufficient documentation

## 2014-10-02 DIAGNOSIS — R202 Paresthesia of skin: Secondary | ICD-10-CM

## 2014-10-02 DIAGNOSIS — Z7951 Long term (current) use of inhaled steroids: Secondary | ICD-10-CM | POA: Diagnosis not present

## 2014-10-02 DIAGNOSIS — E119 Type 2 diabetes mellitus without complications: Secondary | ICD-10-CM | POA: Insufficient documentation

## 2014-10-02 NOTE — ED Notes (Signed)
The pt is c/o rt abd pain and numbness to her rt arm and rt kleg a little more than usual since 1930.  When she woke up at 2200 she had more stiffness in her rt arm and leg than usual

## 2014-10-03 ENCOUNTER — Emergency Department (HOSPITAL_COMMUNITY): Payer: Medicare HMO

## 2014-10-03 DIAGNOSIS — I639 Cerebral infarction, unspecified: Secondary | ICD-10-CM

## 2014-10-03 LAB — URINALYSIS, ROUTINE W REFLEX MICROSCOPIC
BILIRUBIN URINE: NEGATIVE
Glucose, UA: NEGATIVE mg/dL
Ketones, ur: NEGATIVE mg/dL
Nitrite: NEGATIVE
PH: 6 (ref 5.0–8.0)
PROTEIN: NEGATIVE mg/dL
Specific Gravity, Urine: 1.02 (ref 1.005–1.030)
UROBILINOGEN UA: 0.2 mg/dL (ref 0.0–1.0)

## 2014-10-03 LAB — CBC WITH DIFFERENTIAL/PLATELET
Basophils Absolute: 0 10*3/uL (ref 0.0–0.1)
Basophils Relative: 0 % (ref 0–1)
EOS PCT: 2 % (ref 0–5)
Eosinophils Absolute: 0.2 10*3/uL (ref 0.0–0.7)
HCT: 44.8 % (ref 36.0–46.0)
Hemoglobin: 14.3 g/dL (ref 12.0–15.0)
LYMPHS PCT: 31 % (ref 12–46)
Lymphs Abs: 3.2 10*3/uL (ref 0.7–4.0)
MCH: 28.4 pg (ref 26.0–34.0)
MCHC: 31.9 g/dL (ref 30.0–36.0)
MCV: 89.1 fL (ref 78.0–100.0)
MONO ABS: 0.9 10*3/uL (ref 0.1–1.0)
MONOS PCT: 9 % (ref 3–12)
NEUTROS ABS: 6.1 10*3/uL (ref 1.7–7.7)
NEUTROS PCT: 58 % (ref 43–77)
PLATELETS: 262 10*3/uL (ref 150–400)
RBC: 5.03 MIL/uL (ref 3.87–5.11)
RDW: 13.9 % (ref 11.5–15.5)
WBC: 10.3 10*3/uL (ref 4.0–10.5)

## 2014-10-03 LAB — COMPREHENSIVE METABOLIC PANEL
ALBUMIN: 3.7 g/dL (ref 3.5–5.2)
ALT: 18 U/L (ref 0–35)
ANION GAP: 6 (ref 5–15)
AST: 23 U/L (ref 0–37)
Alkaline Phosphatase: 87 U/L (ref 39–117)
BUN: 14 mg/dL (ref 6–23)
CO2: 27 mmol/L (ref 19–32)
Calcium: 9.2 mg/dL (ref 8.4–10.5)
Chloride: 107 mmol/L (ref 96–112)
Creatinine, Ser: 0.75 mg/dL (ref 0.50–1.10)
GFR calc Af Amer: >90 mL/min (ref >90)
GFR, EST NON AFRICAN AMERICAN: 83 mL/min — AB (ref >90)
GLUCOSE: 126 mg/dL — AB (ref 70–99)
POTASSIUM: 3.5 mmol/L (ref 3.5–5.1)
SODIUM: 140 mmol/L (ref 135–145)
TOTAL PROTEIN: 6.9 g/dL (ref 6.0–8.3)
Total Bilirubin: 0.6 mg/dL (ref 0.3–1.2)

## 2014-10-03 LAB — URINE MICROSCOPIC-ADD ON

## 2014-10-03 LAB — ETHANOL: Alcohol, Ethyl (B): <11 mg/dL — ABNORMAL HIGH (ref 0–9)

## 2014-10-03 LAB — I-STAT CHEM 8, ED
BUN: 17 mg/dL (ref 6–23)
CALCIUM ION: 1.18 mmol/L (ref 1.13–1.30)
CHLORIDE: 105 mmol/L (ref 96–112)
CREATININE: 0.7 mg/dL (ref 0.50–1.10)
Glucose, Bld: 120 mg/dL — ABNORMAL HIGH (ref 70–99)
HEMATOCRIT: 49 % — AB (ref 36.0–46.0)
Hemoglobin: 16.7 g/dL — ABNORMAL HIGH (ref 12.0–15.0)
POTASSIUM: 3.4 mmol/L — AB (ref 3.5–5.1)
SODIUM: 143 mmol/L (ref 135–145)
TCO2: 21 mmol/L (ref 0–100)

## 2014-10-03 LAB — I-STAT TROPONIN, ED: TROPONIN I, POC: 0 ng/mL (ref 0.00–0.08)

## 2014-10-03 LAB — LIPASE, BLOOD: Lipase: 31 U/L (ref 11–59)

## 2014-10-03 LAB — PROTIME-INR
INR: 0.99 (ref 0.00–1.49)
PROTHROMBIN TIME: 13.2 s (ref 11.6–15.2)

## 2014-10-03 LAB — RAPID URINE DRUG SCREEN, HOSP PERFORMED
Amphetamines: NOT DETECTED
BARBITURATES: NOT DETECTED
BENZODIAZEPINES: NOT DETECTED
COCAINE: NOT DETECTED
OPIATES: NOT DETECTED
TETRAHYDROCANNABINOL: NOT DETECTED

## 2014-10-03 LAB — APTT: aPTT: 30 seconds (ref 24–37)

## 2014-10-03 NOTE — ED Provider Notes (Signed)
CSN: [id number]     Arrival date & time 10/02/14  2321 History   This chart was scribed for Varney Biles, MD by Chester Holstein, ED Scribe. This patient was seen in room D34C/D34C and the patient's care was started at 3:21 AM.    Chief Complaint  Patient presents with  . code stroke      The history is provided by the patient. No language interpreter was used.    HPI Comments: Sherry Lambert is a 74 y.o. female with PMHx of DM, HTN, GERD, and emphysema who presents to the Emergency Department complaining of right sided abdominal pain and numbness with onset around 7 PM. Pt notes associated tingling in right leg and tightness in right arm. Pt had a CVA on 07/28/14. Pt is able to ambulate but notes mild difficulty.  Pt lives by herself. Pt denies nausea, vomiting, fever, chills, bowel and urinary problems, and hematuria.   Past Medical History  Diagnosis Date  . Emphysema of lung   . GERD (gastroesophageal reflux disease)   . Hypertension   . [name]   . [name]   . Peripheral arterial disease   . Diabetes mellitus without complication    Past Surgical History  Procedure Laterality Date  . Tonsillectomy    . Abdominal hysterectomy    . Colonoscopy    . Esophagogastroduodenoscopy N/A 12/23/2012    Procedure: ESOPHAGOGASTRODUODENOSCOPY (EGD);  Surgeon: Inda Castle, MD;  Location: [facility];  Service: Endoscopy;  Laterality: N/A;  . Balloon dilation N/A 12/23/2012    Procedure: BALLOON DILATION;  Surgeon: Inda Castle, MD;  Location: [facility];  Service: Endoscopy;  Laterality: N/A;  . Esophagogastroduodenoscopy N/A 01/15/2013    Procedure: ESOPHAGOGASTRODUODENOSCOPY (EGD);  Surgeon: Inda Castle, MD;  Location: [facility];  Service: Endoscopy;  Laterality: N/A;  . Balloon dilation N/A 01/15/2013    Procedure: BALLOON DILATION;  Surgeon: Inda Castle, MD;  Location: [facility];  Service: Endoscopy;  Laterality: N/A;   Family History  Problem  Relation Age of Onset  . Hypertension Father   . Diabetes Father   . Cancer Neg Hx   . Early death Neg Hx   . Stroke Neg Hx    History  Substance Use Topics  . Smoking status: Former Smoker -- 55 years    Types: Cigarettes  . Smokeless tobacco: Never Used  . Alcohol Use: No   OB History    No data available     Review of Systems  Constitutional: Negative for fever and chills.  Gastrointestinal: Positive for abdominal pain. Negative for nausea and vomiting.  Genitourinary: Negative for hematuria.  Neurological: Positive for numbness.      Allergies  Review of patient's allergies indicates no known allergies.  Home Medications   Prior to Admission medications   Medication Sig Start Date End Date Taking? Authorizing Provider  Fluticasone-Salmeterol (ADVAIR) 100-50 MCG/DOSE AEPB Inhale 1 puff into the lungs 2 (two) times daily. 09/07/14  Yes Olga Millers, MD  lisinopril (PRINIVIL,ZESTRIL) 10 MG tablet Take 1 tablet (10 mg total) by mouth daily. 09/07/14  Yes Olga Millers, MD   BP 151/64 mmHg  Pulse 76  Temp(Src) 97.9 F (36.6 C) (Oral)  Resp 18  Ht 5' (1.524 m)  Wt 130 lb (58.968 kg)  BMI 25.39 kg/m2  SpO2 100% Physical Exam  Constitutional: She is oriented to person, place, and time. She appears well-developed and well-nourished. No distress.  HENT:  Head:  Normocephalic and atraumatic.  Eyes: Conjunctivae are normal. Right eye exhibits no discharge. Left eye exhibits no discharge.  Neck: Neck supple.  Cardiovascular: Normal rate, regular rhythm and normal heart sounds.  Exam reveals no gallop and no friction rub.   No murmur heard. Pulmonary/Chest: Effort normal and breath sounds normal. No respiratory distress.  Abdominal: Soft. Bowel sounds are normal. She exhibits no distension. There is no tenderness.  Subjective numbness of right abdomen No peritoneal signs  Musculoskeletal: She exhibits no edema or tenderness.  Subjective tingling to RUE and  RLE Normal upper extremity strength 4+/5 bilaterally, lower extremity 4+/5 Cerebellar exam normal  Neurological: She is alert and oriented to person, place, and time. No cranial nerve deficit.  Subjective numbness to the right side, upper ext, lower ext and torso  Skin: Skin is warm and dry.  Psychiatric: She has a normal mood and affect. Her behavior is normal. Thought content normal.  Nursing note and vitals reviewed.   ED Course  Procedures (including critical care time) DIAGNOSTIC STUDIES: Oxygen Saturation is 94% on room air, normal by my interpretation.    COORDINATION OF CARE: 3:27 AM Discussed treatment plan with patient at beside, the patient agrees with the plan and has no further questions at this time.   Labs Review Labs Reviewed  COMPREHENSIVE METABOLIC PANEL - Abnormal; Notable for the following:    Glucose, Bld 126 (*)    GFR calc non Af Amer 83 (*)    All other components within normal limits  URINALYSIS, ROUTINE W REFLEX MICROSCOPIC - Abnormal; Notable for the following:    Hgb urine dipstick TRACE (*)    Leukocytes, UA SMALL (*)    All other components within normal limits  ETHANOL - Abnormal; Notable for the following:    Alcohol, Ethyl (B) <11 (*)    All other components within normal limits  URINE MICROSCOPIC-ADD ON - Abnormal; Notable for the following:    Casts HYALINE CASTS (*)    All other components within normal limits  I-STAT CHEM 8, ED - Abnormal; Notable for the following:    Potassium 3.4 (*)    Glucose, Bld 120 (*)    Hemoglobin 16.7 (*)    HCT 49.0 (*)    All other components within normal limits  CBC WITH DIFFERENTIAL/PLATELET  LIPASE, BLOOD  PROTIME-INR  APTT  URINE RAPID DRUG SCREEN (HOSP PERFORMED)  I-STAT TROPOININ, ED  I-STAT TROPOININ, ED    Imaging Review No results found.   EKG Interpretation   Date/Time:  Saturday October 02 2014 23:54:00 EST Ventricular Rate:  85 PR Interval:  148 QRS Duration: 77 QT Interval:   379 QTC Calculation: 451 R Axis:   64 Text Interpretation:  Sinus rhythm inerior q waves, new with no ST changes  No significant change since last tracing Confirmed by Charlese Gruetzmacher, MD, Aarin Sparkman  [phone]) on 10/03/2014 3:06:33 AM      MDM   Final diagnoses:  Numbness and tingling of right arm  Numbness and tingling of right leg    PT comes in w/ cc of right sided numbness. She has hx of strokes and had presented to the ER as code stroke.  Neurology team was involved in the patient care, and independently evaluated the patient. They believe that pt is likely having sequelae of her existing stroke, and that this is not a new stroke. MRI ordered per their request, and doesn't show a stroke.  Results discussed w/ the pt. If it's not stroke/neurologic etiology,  it is unlikely to be any other system. Return precautions discussed with the family.    Varney Biles, MD 10/08/14 [phone]

## 2014-10-03 NOTE — ED Notes (Signed)
Pt in MRI.

## 2014-10-03 NOTE — Discharge Instructions (Signed)
We saw you in the ER for the numbness. All the results in the ER are normal, labs and imaging. Neurology team has seen you, and clear you from that perspective. The workup in the ER is not complete, and is limited to screening for life threatening and emergent conditions only, so please continue seeing your doctor as planned.   Stroke Prevention Some medical conditions and behaviors are associated with an increased chance of having a stroke. You may prevent a stroke by making healthy choices and managing medical conditions. HOW CAN I REDUCE MY RISK OF HAVING A STROKE?   Stay physically active. Get at least 30 minutes of activity on most or all days.  Do not smoke. It may also be helpful to avoid exposure to secondhand smoke.  Limit alcohol use. Moderate alcohol use is considered to be:  No more than 2 drinks per day for men.  No more than 1 drink per day for nonpregnant women.  Eat healthy foods. This involves:  Eating 5 or more servings of fruits and vegetables a day.  Making dietary changes that address high blood pressure (hypertension), high cholesterol, diabetes, or obesity.  Manage your cholesterol levels.  Making food choices that are high in fiber and low in saturated fat, trans fat, and cholesterol may control cholesterol levels.  Take any prescribed medicines to control cholesterol as directed by your health care provider.  Manage your diabetes.  Controlling your carbohydrate and sugar intake is recommended to manage diabetes.  Take any prescribed medicines to control diabetes as directed by your health care provider.  Control your hypertension.  Making food choices that are low in salt (sodium), saturated fat, trans fat, and cholesterol is recommended to manage hypertension.  Take any prescribed medicines to control hypertension as directed by your health care provider.  Maintain a healthy weight.  Reducing calorie intake and making food choices that are low  in sodium, saturated fat, trans fat, and cholesterol are recommended to manage weight.  Stop drug abuse.  Avoid taking birth control pills.  Talk to your health care provider about the risks of taking birth control pills if you are over 55 years old, smoke, get migraines, or have ever had a blood clot.  Get evaluated for sleep disorders (sleep apnea).  Talk to your health care provider about getting a sleep evaluation if you snore a lot or have excessive sleepiness.  Take medicines only as directed by your health care provider.  For some people, aspirin or blood thinners (anticoagulants) are helpful in reducing the risk of forming abnormal blood clots that can lead to stroke. If you have the irregular heart rhythm of atrial fibrillation, you should be on a blood thinner unless there is a good reason you cannot take them.  Understand all your medicine instructions.  Make sure that other conditions (such as anemia or atherosclerosis) are addressed. SEEK IMMEDIATE MEDICAL CARE IF:   You have sudden weakness or numbness of the face, arm, or leg, especially on one side of the body.  Your face or eyelid droops to one side.  You have sudden confusion.  You have trouble speaking (aphasia) or understanding.  You have sudden trouble seeing in one or both eyes.  You have sudden trouble walking.  You have dizziness.  You have a loss of balance or coordination.  You have a sudden, severe headache with no known cause.  You have new chest pain or an irregular heartbeat. Any of these symptoms may represent a  serious problem that is an emergency. Do not wait to see if the symptoms will go away. Get medical help at once. Call your local emergency services (911 in [place] Do not drive yourself to the hospital. Document Released: 09/27/2004 Document Revised: 01/04/2014 Document Reviewed: 02/20/2013 [facility] Patient Information 2015 [place], [place]. This information is not intended to replace  advice given to you by your health care provider. Make sure you discuss any questions you have with your health care provider.

## 2014-10-03 NOTE — ED Notes (Signed)
MD at bedside. 

## 2014-10-03 NOTE — Consult Note (Signed)
Consult Reason for Consult: right sided paresthesias Referring Physician: Dr Tammy Sours [facility] ED  CC: right sided paresthesias  HPI: Sherry Lambert is an 74 y.o. 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 Notes tightness in her leg started around 7 days ago after working with PT. Has not resolved. She has baseline numbness/paresthesias on her right side but feels it was worse upon waking up this evening. She also notes some right sided abdominal pain. BP noted to be 166/97 upon arrival.   Past Medical History  Diagnosis Date  . Emphysema of lung   . GERD (gastroesophageal reflux disease)   . Hypertension   . [name]   . [name]   . Peripheral arterial disease   . Diabetes mellitus without complication     Past Surgical History  Procedure Laterality Date  . Tonsillectomy    . Abdominal hysterectomy    . Colonoscopy    . Esophagogastroduodenoscopy N/A 12/23/2012    Procedure: ESOPHAGOGASTRODUODENOSCOPY (EGD);  Surgeon: Inda Castle, MD;  Location: [facility];  Service: Endoscopy;  Laterality: N/A;  . Balloon dilation N/A 12/23/2012    Procedure: BALLOON DILATION;  Surgeon: Inda Castle, MD;  Location: [facility];  Service: Endoscopy;  Laterality: N/A;  . Esophagogastroduodenoscopy N/A 01/15/2013    Procedure: ESOPHAGOGASTRODUODENOSCOPY (EGD);  Surgeon: Inda Castle, MD;  Location: [facility];  Service: Endoscopy;  Laterality: N/A;  . Balloon dilation N/A 01/15/2013    Procedure: BALLOON DILATION;  Surgeon: Inda Castle, MD;  Location: [facility];  Service: Endoscopy;  Laterality: N/A;    Family History  Problem Relation Age of Onset  . Hypertension Father   . Diabetes Father   . Cancer Neg Hx   . Early death Neg Hx   . Stroke Neg Hx     Social History:  reports that she has quit smoking. Her smoking use included Cigarettes. She  quit after 55 years of use. She has never used smokeless tobacco. She reports that she does not drink alcohol or use illicit drugs.  No Known Allergies  Medications: I have reviewed the patient's current medications.  Head CT imaging reviewed and no signs of acute process  ROS: Out of a complete 14 system review, the patient complains of only the following symptoms, and all other reviewed systems are negative. +paresthesias, abdominal pain  Physical Examination: Filed Vitals:   10/02/14 2325  BP: 166/97  Pulse: 90  Temp: 97.7 F (36.5 C)  Resp: 18   Physical Exam  Constitutional: He appears well-developed and well-nourished.  Psych: Affect appropriate to situation Eyes: No scleral injection HENT: No OP obstrucion Head: Normocephalic.  Cardiovascular: Normal rate and regular rhythm.  Respiratory: Effort normal and breath sounds normal.  GI: Soft. Bowel sounds are normal. No distension. There is no tenderness.  Skin: WDI  Neurologic Examination Mental Status: Alert, oriented to name and location, difficulty with date.  Speech fluent without evidence of aphasia. No dysarthria.  Has difficulty following commands, requires multiple repeated requests Cranial Nerves: II: unable to visualize fundi, visual fields grossly normal, pupils equal, round, reactive to light and accommodation III,IV, VI: ptosis not present, extra-ocular motions intact bilaterally V,VII: smile symmetric, decreased LT on right in V1-V3 distribution VIII: hearing normal bilaterally IX,X: cough reflex present XI: trapezius strength/neck flexion strength normal bilaterally XII: tongue strength normal  Motor: LUE and LLE 5/5 strength throughout  RUE proximal 5-/5, distal 5/5 RLE proximal 5-/5, distal 5/5 Tone and bulk:normal tone throughout; no atrophy noted Sensory: diminished LT on right side Deep Tendon Reflexes: 2+ and symmetric throughout Plantars: Right: downgoing   Left:  downgoing Cerebellar: normal finger-to-nose, and normal heel-to-shin test Gait: deferred  Laboratory Studies:   Basic Metabolic Panel: No results for input(s): NA, K, CL, CO2, GLUCOSE, BUN, CREATININE, CALCIUM, MG, PHOS in the last 168 hours.  Liver Function Tests: No results for input(s): AST, ALT, ALKPHOS, BILITOT, PROT, ALBUMIN in the last 168 hours. No results for input(s): LIPASE, AMYLASE in the last 168 hours. No results for input(s): AMMONIA in the last 168 hours.  CBC: No results for input(s): WBC, NEUTROABS, HGB, HCT, MCV, PLT in the last 168 hours.  Cardiac Enzymes: No results for input(s): CKTOTAL, CKMB, CKMBINDEX, TROPONINI in the last 168 hours.  BNP: Invalid input(s): POCBNP  CBG: No results for input(s): GLUCAP in the last 168 hours.  Microbiology: Results for orders placed or performed during the hospital encounter of 07/28/14  MRSA PCR Screening     Status: None   Collection Time: 07/28/14 10:43 PM  Result Value Ref Range Status   MRSA by PCR NEGATIVE NEGATIVE Final    Comment:        The GeneXpert MRSA Assay (FDA approved for NASAL specimens only), is one component of a comprehensive MRSA colonization surveillance program. It is not intended to diagnose MRSA infection nor to guide or monitor treatment for MRSA infections.   Urine culture     Status: None   Collection Time: 07/31/14  2:48 PM  Result Value Ref Range Status   Specimen Description URINE, RANDOM  Final   Special Requests NONE  Final   Culture  Setup Time   Final    07/31/2014 21:36 Performed at [place]   Final    >=100,000 COLONIES/ML Performed at [facility]    Culture   Final    ESCHERICHIA COLI Performed at [facility]    Report Status 08/02/2014 FINAL  Final   Organism ID, Bacteria ESCHERICHIA COLI  Final      Susceptibility   Escherichia coli - MIC*    AMPICILLIN 8 SENSITIVE Sensitive     CEFAZOLIN <=4 SENSITIVE Sensitive      CEFTRIAXONE <=1 SENSITIVE Sensitive     CIPROFLOXACIN 0.5 SENSITIVE Sensitive     GENTAMICIN <=1 SENSITIVE Sensitive     LEVOFLOXACIN 1 SENSITIVE Sensitive     NITROFURANTOIN 32 SENSITIVE Sensitive     TOBRAMYCIN <=1 SENSITIVE Sensitive     TRIMETH/SULFA <=20 SENSITIVE Sensitive     PIP/TAZO <=4 SENSITIVE Sensitive     * ESCHERICHIA COLI    Coagulation Studies: No results for input(s): LABPROT, INR in the last 72 hours.  Urinalysis: No results for input(s): COLORURINE, LABSPEC, PHURINE, GLUCOSEU, HGBUR, BILIRUBINUR, KETONESUR, PROTEINUR, UROBILINOGEN, NITRITE, LEUKOCYTESUR in the last 168 hours.  Invalid input(s): APPERANCEUR  Lipid Panel:     Component Value Date/Time   CHOL 161 07/30/2014 0205   TRIG 72 07/30/2014 0205   HDL 64 07/30/2014 0205   CHOLHDL 2.5 07/30/2014 0205   VLDL 14 07/30/2014 0205   LDLCALC 83 07/30/2014 0205    HgbA1C:  Lab Results  Component Value Date   HGBA1C 6.6* 07/30/2014    Urine Drug Screen:      Component Value Date/Time   LABOPIA NONE DETECTED 07/29/2014 0308   COCAINSCRNUR NONE DETECTED 07/29/2014 0308  LABBENZ NONE DETECTED 07/29/2014 0308   AMPHETMU NONE DETECTED 07/29/2014 0308   THCU NONE DETECTED 07/29/2014 0308   LABBARB NONE DETECTED 07/29/2014 0308    Alcohol Level: No results for input(s): ETH in the last 168 hours.  Other results:  Imaging: Ct Head Wo Contrast  10/02/2014   CLINICAL DATA:  Code stroke. Right arm and leg numbness and tingling since 1930 hr. Worsening at 2230 hr.  EXAM: CT HEAD WITHOUT CONTRAST  TECHNIQUE: Contiguous axial images were obtained from the base of the skull through the vertex without intravenous contrast.  COMPARISON:  MRI brain 07/30/2014.  CT head 07/29/2014.  FINDINGS: Diffuse cerebral atrophy. No ventricular dilatation. Low-attenuation changes in the deep white matter consistent with small vessel ischemic changes. Patchy low-attenuation changes seen in the deep white matter on the left  corresponding to an area of hemorrhage on previous study. No mass effect or midline shift. No abnormal extra-axial fluid collections. Gray-white matter junctions are distinct. Basal cisterns are not effaced. No evidence of acute intracranial hemorrhage. No depressed skull fractures. Visualized paranasal sinuses and mastoid air cells are not opacified.  IMPRESSION: No acute intracranial abnormalities. Chronic atrophy and small vessel ischemic changes.  These results were called by telephone at the time of interpretation on 10/02/2014 at 11:48 pm to Dr. Christy Gentles, who verbally acknowledged these results.   Electronically Signed   By: Lucienne Capers M.D.   On: 10/02/2014 23:49     Assessment/Plan:  73y/o woman with history of HTN, DM, L thalamic [name] in 07/2014 with residual right sided paresthesias presents with 1 week of right leg tightness and recent worsening of her baseline paresthesias on the right side. Exam pertinent for decreased LT on the right side but otherwise unremarkable. Head CT imaging reviewed and unremarkable. Suspect symptoms represent exacerbation of prior stroke symptoms but cannot rule out new infarct.   -lab workup pending -suggest MRI brain without contrast. If negative no further neurological workup indicated.  -abdominal pain workup per ED -follow up with outpatient neurology. Has scheduled appointment with Dr Erlinda Hong of [facility] on 2/23   Jim Like, DO [facility] [phone]  If 7pm- 7am, please page neurology on call as listed in [place]. 10/03/2014, 12:03 AM

## 2014-10-06 ENCOUNTER — Telehealth: Payer: Self-pay | Admitting: Neurology

## 2014-10-06 NOTE — Telephone Encounter (Signed)
Patient requesting a return call, has additional questions.  Please call and advise.

## 2014-10-06 NOTE — Telephone Encounter (Signed)
Thank you for the update. She should see her PCP. She was seen by Dr. Leonie Man in 08/2014 in hospital and I have no idea about her at all. Thanks.  Rosalin Hawking, MD PhD Stroke Neurology 10/06/2014 2:19 PM

## 2014-10-06 NOTE — Telephone Encounter (Signed)
Spoke with patient and she states that she was seen in the ER, they ruled out Gallbladder and Appendicitis, and stated that she did not have a stroke, instructed patient per Dr Leonie Man to be seen by her PCP to evaluate her condition, patient understood and stated that the pain was in a different place than when she had her stroke. Patient will follow up with PCP. Dr Carlis Stable

## 2014-10-06 NOTE — Telephone Encounter (Signed)
Pt is calling stating she is having pain on her right side where she had her stroke.  She went to the ER on Saturday and they stated she needed to call our office to see if she could be seen sooner thatn 2/23.  Please call and advise.

## 2014-10-07 NOTE — Telephone Encounter (Signed)
Spoke with patient and she states that she is having leg jerking, foot numbness, knee to hip tingling all on the right side, also states that she was walking better and now she is not walking good anymore, as well as her hands have gotten worse, informed patient to keep appointment with her PCP tomorrow, and that message will be sent to Dr Erlinda Hong. Patient believes that she should not wait til scheduled appointment on 10/26/14 with Dr Erlinda Hong. Patient was seen in ER and was ruled out that patient was not having an Stroke, please see phone note dated 10/06/14.

## 2014-10-07 NOTE — Telephone Encounter (Signed)
Patient calling having trouble on her right side. She could hardly do anything. Patient has an appt with PCP tomorrow but she is in severe pain now. Please advise.

## 2014-10-08 ENCOUNTER — Encounter: Payer: Self-pay | Admitting: Internal Medicine

## 2014-10-08 ENCOUNTER — Ambulatory Visit (INDEPENDENT_AMBULATORY_CARE_PROVIDER_SITE_OTHER): Payer: Medicare HMO | Admitting: Internal Medicine

## 2014-10-08 VITALS — BP 110/64 | HR 86 | Temp 98.3°F | Resp 20 | Ht 60.0 in | Wt 131.0 lb

## 2014-10-08 DIAGNOSIS — R1011 Right upper quadrant pain: Secondary | ICD-10-CM

## 2014-10-08 NOTE — Assessment & Plan Note (Signed)
Lipase negative in ER, CMP normal in ER. Check abdominal US as gall bladder disease is most likely although no typical triggers. Will also show liver, kidneys.

## 2014-10-08 NOTE — Telephone Encounter (Signed)
As I mentioned before, this pt was seen by Dr. Leonie Man in 08/2014 in hospital and Dr. Leonie Man instructed pt to follow up with him in one month. Apparently she was not seen within a month, but she should follow up with Dr. Leonie Man next available. I have never seen this pt in the past and I do not know much about her. Thanks.  Rosalin Hawking, MD PhD Stroke Neurology 10/08/2014 1:23 PM

## 2014-10-08 NOTE — Progress Notes (Signed)
   Subjective:    Patient ID: Sherry Lambert, female    DOB: 03-23-1941, 74 y.o.   MRN: [id number]  HPI The patient is a 74 YO female who comes in today for right upper quadrant pain. She noticed it first 1-2 months ago and it came and then left. It lasts for about 30 minutes and not triggered by anything she can tell. It is sharp and constant. Not associated with nausea, vomiting, constipation, diarrhea. She was seen in the ER for possible stroke recently (documents reviewed today and MRI negative for new stroke, labs all normal) and has followed up with neurology.   Review of Systems  Constitutional: Negative for fever, chills, activity change, appetite change, fatigue and unexpected weight change.  HENT: Negative.   Eyes: Negative.   Respiratory: Negative for cough, chest tightness, shortness of breath and wheezing.   Cardiovascular: Negative for chest pain, palpitations and leg swelling.  Gastrointestinal: Positive for abdominal pain. Negative for nausea, diarrhea, constipation and abdominal distention.  Musculoskeletal: Positive for gait problem. Negative for myalgias, back pain and arthralgias.  Skin: Negative.   Neurological: Positive for numbness. Negative for dizziness, seizures, speech difficulty, weakness, light-headedness and headaches.      Objective:   Physical Exam  Constitutional: She is oriented to person, place, and time. She appears well-developed and well-nourished.  HENT:  Head: Normocephalic and atraumatic.  Eyes: EOM are normal.  Neck: Normal range of motion.  Cardiovascular: Normal rate and regular rhythm.   Pulmonary/Chest: Breath sounds normal. No respiratory distress. She has no wheezes. She has no rales.  Abdominal: Soft. Bowel sounds are normal. She exhibits no distension. There is tenderness. There is no rebound.  Mild tenderness in the right upper quadrant  Musculoskeletal: She exhibits no edema.  Neurological: She is alert and oriented to person, place,  and time. Coordination abnormal.  Uses walker, abnormal sensation right extremity upper and lower. Strength 5/5 upper and lower extremity bilaterally.  Skin: Skin is warm and dry.   Filed Vitals:   10/08/14 1324  BP: 110/64  Pulse: 86  Temp: 98.3 F (36.8 C)  TempSrc: Oral  Resp: 20  Height: 5' (1.524 m)  Weight: 131 lb (59.421 kg)  SpO2: 97%      Assessment & Plan:

## 2014-10-08 NOTE — Patient Instructions (Signed)
We are going to check an ultrasound of the stomach to make sure there are no problems with the gall bladder, liver, kidneys.   In the meantime you can use tylenol or ibuprofen for pain if needed.   Cholelithiasis Cholelithiasis (also called gallstones) is a form of gallbladder disease in which gallstones form in your gallbladder. The gallbladder is an organ that stores bile made in the liver, which helps digest fats. Gallstones begin as small crystals and slowly grow into stones. Gallstone pain occurs when the gallbladder spasms and a gallstone is blocking the duct. Pain can also occur when a stone passes out of the duct.  RISK FACTORS  Being female.   Having multiple pregnancies. Health care providers sometimes advise removing diseased gallbladders before future pregnancies.   Being obese.  Eating a diet heavy in fried foods and fat.   Being older than 50 years and increasing age.   Prolonged use of medicines containing female hormones.   Having diabetes mellitus.   Rapidly losing weight.   Having a family history of gallstones (heredity).  SYMPTOMS  Nausea.   Vomiting.  Abdominal pain.   Yellowing of the skin (jaundice).   Sudden pain. It may persist from several minutes to several hours.  Fever.   Tenderness to the touch. In some cases, when gallstones do not move into the bile duct, people have no pain or symptoms. These are called "silent" gallstones.  TREATMENT Silent gallstones do not need treatment. In severe cases, emergency surgery may be required. Options for treatment include:  Surgery to remove the gallbladder. This is the most common treatment.  Medicines. These do not always work and may take 6-12 months or more to work.  Shock wave treatment (extracorporeal biliary lithotripsy). In this treatment an ultrasound machine sends shock waves to the gallbladder to break gallstones into smaller pieces that can pass into the intestines or be  dissolved by medicine. HOME CARE INSTRUCTIONS   Only take over-the-counter or prescription medicines for pain, discomfort, or fever as directed by your health care provider.   Follow a low-fat diet until seen again by your health care provider. Fat causes the gallbladder to contract, which can result in pain.   Follow up with your health care provider as directed. Attacks are almost always recurrent and surgery is usually required for permanent treatment.  SEEK IMMEDIATE MEDICAL CARE IF:   Your pain increases and is not controlled by medicines.   You have a fever or persistent symptoms for more than 2-3 days.   You have a fever and your symptoms suddenly get worse.   You have persistent nausea and vomiting.  MAKE SURE YOU:   Understand these instructions.  Will watch your condition.  Will get help right away if you are not doing well or get worse. Document Released: 08/16/2005 Document Revised: 04/22/2013 Document Reviewed: 02/11/2013 [facility] Patient Information 2015 [place], [place]. This information is not intended to replace advice given to you by your health care provider. Make sure you discuss any questions you have with your health care provider.

## 2014-10-08 NOTE — Progress Notes (Signed)
Pre visit review using our clinic review tool, if applicable. No additional management support is needed unless otherwise documented below in the visit note. 

## 2014-10-11 NOTE — Telephone Encounter (Signed)
Patient was scheduled with you because Dr Leonie Man does not have anything until May 2015. I can forward message to Dr Leonie Man if you would like, please advise

## 2014-10-12 NOTE — Telephone Encounter (Signed)
Dr Erlinda Hong please see note for 10/11/14

## 2014-10-13 NOTE — Telephone Encounter (Signed)
It is OK, no problem. I will see her on 10/26/14. Thanks.  Rosalin Hawking, MD PhD Stroke Neurology 10/13/2014 10:31 AM

## 2014-10-19 ENCOUNTER — Encounter: Payer: Medicare HMO | Admitting: Internal Medicine

## 2014-10-21 ENCOUNTER — Encounter: Payer: Self-pay | Admitting: Internal Medicine

## 2014-10-21 ENCOUNTER — Other Ambulatory Visit (INDEPENDENT_AMBULATORY_CARE_PROVIDER_SITE_OTHER): Payer: Medicare HMO

## 2014-10-21 ENCOUNTER — Ambulatory Visit (INDEPENDENT_AMBULATORY_CARE_PROVIDER_SITE_OTHER): Payer: Medicare HMO | Admitting: Internal Medicine

## 2014-10-21 VITALS — BP 132/62 | HR 96 | Temp 97.6°F | Resp 16 | Ht 60.0 in | Wt 132.8 lb

## 2014-10-21 DIAGNOSIS — E119 Type 2 diabetes mellitus without complications: Secondary | ICD-10-CM

## 2014-10-21 DIAGNOSIS — Z0000 Encounter for general adult medical examination without abnormal findings: Secondary | ICD-10-CM

## 2014-10-21 DIAGNOSIS — I10 Essential (primary) hypertension: Secondary | ICD-10-CM

## 2014-10-21 LAB — BASIC METABOLIC PANEL
BUN: 13 mg/dL (ref 6–23)
CALCIUM: 9.7 mg/dL (ref 8.4–10.5)
CO2: 32 meq/L (ref 19–32)
Chloride: 104 mEq/L (ref 96–112)
Creatinine, Ser: 0.75 mg/dL (ref 0.40–1.20)
GFR: 80.65 mL/min (ref >60.00)
Glucose, Bld: 102 mg/dL — ABNORMAL HIGH (ref 70–99)
Potassium: 3.9 mEq/L (ref 3.5–5.1)
SODIUM: 141 meq/L (ref 135–145)

## 2014-10-21 LAB — LIPID PANEL
CHOLESTEROL: 196 mg/dL (ref 0–200)
HDL: 64 mg/dL (ref >39.00)
LDL Cholesterol: 114 mg/dL — ABNORMAL HIGH (ref 0–99)
NonHDL: 132
TRIGLYCERIDES: 92 mg/dL (ref 0.0–149.0)
Total CHOL/HDL Ratio: 3
VLDL: 18.4 mg/dL (ref 0.0–40.0)

## 2014-10-21 LAB — HEMOGLOBIN A1C: HEMOGLOBIN A1C: 6.8 % — AB (ref 4.6–6.5)

## 2014-10-21 NOTE — Patient Instructions (Addendum)
We will check on your blood work today and have cleaned out your ears.   We will see you back in about 3-6 months  Advance Directive Advance directives are the legal documents that allow you to make choices about your health care and medical treatment if you cannot speak for yourself. Advance directives are a way for you to communicate your wishes to family, friends, and health care providers. The specified people can then convey your decisions about end-of-life care to avoid confusion if you should become unable to communicate. Ideally, the process of discussing and writing advance directives should happen over time rather than making decisions all at once. Advance directives can be modified as your situation changes, and you can change your mind at any time, even after you have signed the advance directives. Each state has its own laws regarding advance directives. You may want to check with your health care provider, attorney, or state representative about the law in your state. Below are some examples of advance directives. LIVING WILL A living will is a set of instructions documenting your wishes about medical care when you cannot care for yourself. It is used if you become:  Terminally ill.  Incapacitated.  Unable to communicate.  Unable to make decisions. Items to consider in your living will include:  The use or non-use of life-sustaining equipment, such as dialysis machines and breathing machines (ventilators).  A do not resuscitate (DNR) order, which is the instruction not to use cardiopulmonary resuscitation (CPR) if breathing or heartbeat stops.  Tube feeding.  Withholding of food and fluids.  Comfort (palliative) care when the goal becomes comfort rather than a cure.  Organ and tissue donation. A living will does not give instructions about distribution of your money and property if you should pass away. It is advisable to seek the expert advice of a lawyer in drawing up a  will regarding your possessions. Decisions about taxes, beneficiaries, and asset distribution will be legally binding. This process can relieve your family and friends of any burdens surrounding disputes or questions that may come up about the allocation of your assets. DO NOT RESUSCITATE (DNR) A do not resuscitate (DNR) order is a request to not have CPR in the event that your heart stops beating or you stop breathing. Unless given other instructions, a health care provider will try to help any patient whose heart has stopped or who has stopped breathing.  HEALTH CARE PROXY AND DURABLE POWER OF ATTORNEY FOR HEALTH CARE A health care proxy is a person (agent) appointed to make medical decisions for you if you cannot. Generally, people choose someone they know well and trust to represent their preferences when they can no longer do so. You should be sure to ask this person for agreement to act as your agent. An agent may have to exercise judgment in the event of a medical decision for which your wishes are not known. The durable power of attorney for health care is the legal document that names your health care proxy. Once written, it should be:  Signed.  Notarized.  Dated.  Copied.  Witnessed.  Incorporated into your medical record. You may also want to appoint someone to manage your financial affairs if you cannot. This is called a durable power of attorney for finances. It is a separate legal document from the durable power of attorney for health care. You may choose the same person or someone different from your health care proxy to act as your agent  in financial matters. Document Released: 11/27/2007 Document Revised: 08/25/2013 Document Reviewed: 01/07/2013 [facility] Patient Information 2015 [place], [place]. This information is not intended to replace advice given to you by your health care provider. Make sure you discuss any questions you have with your health care provider.

## 2014-10-21 NOTE — Progress Notes (Signed)
   Subjective:    Patient ID: Sherry Lambert, female    DOB: [birth year], 74 y.o.   MRN: [id number]  HPI The patient is a 74 Yo female who is coming in for wellness. Denies new problems except some color change on her toes which has been evaluated in the past.    Diet:DM if diabetic Physical activity: sedentary Depression/mood screen: negative Hearing: intact to whispered voice Visual acuity: grossly normal, performs annual eye exam  ADLs: capable Fall risk: none Home safety: good Cognitive evaluation: intact to orientation, naming, recall and repetition EOL planning: adv directives, DNR   I have personally reviewed and have noted 1. The patient's medical and social history 2. Their use of alcohol, tobacco or illicit drugs 3. Their current medications and supplements 4. The patient's functional ability including ADL's, fall risks, home safety risks and hearing or visual impairment. 5. Diet and physical activities 6. Evidence for depression or mood disorders 7. Care team reviewed and updated  Review of Systems  Constitutional: Negative for fever, chills, activity change, appetite change, fatigue and unexpected weight change.  HENT: Negative.   Eyes: Negative.   Respiratory: Negative for cough, chest tightness, shortness of breath and wheezing.   Cardiovascular: Negative for chest pain, palpitations and leg swelling.  Gastrointestinal: Negative for nausea, abdominal pain, diarrhea, constipation and abdominal distention.  Musculoskeletal: Positive for gait problem. Negative for myalgias, back pain and arthralgias.  Skin: Negative.   Neurological: Positive for numbness. Negative for dizziness, seizures, speech difficulty, weakness, light-headedness and headaches.      Objective:   Physical Exam  Constitutional: She is oriented to person, place, and time. She appears well-developed and well-nourished.  HENT:  Head: Normocephalic and atraumatic.  Eyes: EOM are normal.  Neck:  Normal range of motion.  Cardiovascular: Normal rate and regular rhythm.   Pulmonary/Chest: Breath sounds normal. No respiratory distress. She has no wheezes. She has no rales.  Abdominal: Soft. Bowel sounds are normal. She exhibits no distension. There is no tenderness. There is no rebound.  Musculoskeletal: She exhibits no edema.  Neurological: She is alert and oriented to person, place, and time. Coordination abnormal.  Uses walker, abnormal sensation right extremity upper and lower. Strength 5/5 upper and lower extremity bilaterally.  Skin: Skin is warm and dry.  Poor peripheral capillary refill, one toe red, no signs gangrene or injury.   Filed Vitals:   10/21/14 1307  BP: 132/62  Pulse: 96  Temp: 97.6 F (36.4 C)  TempSrc: Oral  Resp: 16  Height: 5' (1.524 m)  Weight: 132 lb 12.8 oz (60.238 kg)  SpO2: 96%      Assessment & Plan:  Patient declines all screening or immunizations.

## 2014-10-21 NOTE — Progress Notes (Signed)
Pre visit review using our clinic review tool, if applicable. No additional management support is needed unless otherwise documented below in the visit note. 

## 2014-10-22 ENCOUNTER — Encounter: Payer: Self-pay | Admitting: Internal Medicine

## 2014-10-22 DIAGNOSIS — Z0000 Encounter for general adult medical examination without abnormal findings: Secondary | ICD-10-CM | POA: Insufficient documentation

## 2014-10-22 NOTE — Assessment & Plan Note (Signed)
Currently controlled on diet alone. Check HgA1c today and foot exam done.

## 2014-10-22 NOTE — Assessment & Plan Note (Signed)
BP controlled on her lisinopril 10 mg daily. Check BMP.

## 2014-10-22 NOTE — Assessment & Plan Note (Signed)
Does not want colon cancer screening, needs bone density (she does not want). She declines shingles today. Up to date on flu, pneumonia, tetanus. Up to date on mammogram.

## 2014-10-26 ENCOUNTER — Ambulatory Visit (INDEPENDENT_AMBULATORY_CARE_PROVIDER_SITE_OTHER): Payer: Medicare HMO | Admitting: Neurology

## 2014-10-26 ENCOUNTER — Encounter: Payer: Self-pay | Admitting: Neurology

## 2014-10-26 VITALS — BP 172/90 | HR 72 | Ht 60.5 in | Wt 133.0 lb

## 2014-10-26 DIAGNOSIS — R7309 Other abnormal glucose: Secondary | ICD-10-CM

## 2014-10-26 DIAGNOSIS — E785 Hyperlipidemia, unspecified: Secondary | ICD-10-CM

## 2014-10-26 DIAGNOSIS — R7303 Prediabetes: Secondary | ICD-10-CM

## 2014-10-26 DIAGNOSIS — I619 Nontraumatic intracerebral hemorrhage, unspecified: Secondary | ICD-10-CM

## 2014-10-26 DIAGNOSIS — I10 Essential (primary) hypertension: Secondary | ICD-10-CM

## 2014-10-26 MED ORDER — LISINOPRIL 20 MG PO TABS: 20.0000 mg | ORAL_TABLET | Freq: Every day | ORAL | Status: DC

## 2014-10-26 MED ORDER — ATORVASTATIN CALCIUM 20 MG PO TABS: 20.0000 mg | ORAL_TABLET | Freq: Every day | ORAL | Status: DC

## 2014-10-26 NOTE — Patient Instructions (Signed)
-   your BP still high, I will increase your lisinopril to 20mg  once a day - check BP at home twice a day for 2 weeks at least and record and bring over to your PCP for medication adjustment - your LDL is high and I will start you on lipitor 20mg   - since your BP not in good control, I will hold off baby ASA this time for stroke prevention. Once your BP in good control, will consider addition of ASA - Follow up with your primary care physician for stroke risk factor modification. Recommend maintain blood pressure goal <130/80, diabetes with hemoglobin A1c goal below 6.5% and lipids with LDL cholesterol goal below 70 mg/dL.  - follow up in 2 months.

## 2014-10-26 NOTE — Progress Notes (Signed)
STROKE NEUROLOGY FOLLOW UP NOTE  NAME: Sherry Lambert DOB: [birth year]  REASON FOR VISIT: stroke follow up HISTORY FROM: chart and pt  Today we had the pleasure of seeing Sherry Lambert in follow-up at our Neurology Clinic. Pt was accompanied by son.   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 CT showed acute left thalamic hemorrhage with ventricular extension. Subsequent MRI confirmed the hemorrhage but no evidence of tumor or AVM. MRA showed diffuse athero. She was stabilized in hospital and later discharged to CIR.    She was sent home after rehab with residual right sided paresthesias. However, she was seen again in ER on 10/03/14 due to worsening of her baseline paresthesias on the right side. Exam pertinent for decreased LT on the right side but otherwise unremarkable. Head CT imaging showed resolution of [name] and MRI showed no acute stroke. Her BP was high at 166/97. Suspect symptoms represent exacerbation of prior stroke symptoms.   Interval History During the interval time, the patient has been doing the same. She still complains of right arm and leg numbness and tingling. She is able to walk without any assistance. She is taking only 10mg  lisinopril and her BP was high today at 172/90. She recently visited her PCP and had blood test showed LDL 114 and A1C 6.8.  REVIEW OF SYSTEMS: Full 14 system review of systems performed and notable only for those listed below and in HPI above, all others are negative:  Constitutional:  Activity change Cardiovascular:  Ear/Nose/Throat:  Hearing loss Skin:  Eyes:  Eye itching Respiratory:   Gastroitestinal:   Genitourinary:  Hematology/Lymphatic:   Endocrine:  Musculoskeletal:  Walking difficulty Allergy/Immunology:   Neurological:  Numbness Psychiatric:  Sleep:   The following represents the patient's updated allergies and side effects  list: No Known Allergies  The neurologically relevant items on the patient's problem list were reviewed on today's visit.  Neurologic Examination  A problem focused neurological exam (12 or more points of the single system neurologic examination, vital signs counts as 1 point, cranial nerves count for 8 points) was performed.  Blood pressure 172/90, pulse 72, height 5' 0.5" (1.537 m), weight 133 lb (60.328 kg).  General - Well nourished, well developed, in no apparent distress.  Ophthalmologic - Sharp disc margins OU.  Cardiovascular - Regular rate and rhythm with no murmur.  Mental Status -  Level of arousal and orientation to time, place, and person were intact. Language including expression, naming, repetition, comprehension was assessed and found intact.  Cranial Nerves II - XII - II - Visual field intact OU. III, IV, VI - Extraocular movements intact. V - Facial sensation intact bilaterally. VII - Facial movement intact bilaterally. VIII - Hearing & vestibular intact bilaterally. X - Palate elevates symmetrically. XI - Chin turning & shoulder shrug intact bilaterally. XII - Tongue protrusion intact.  Motor Strength - The patient's strength was normal in all extremities and pronator drift was absent except RLE 4/5 proximal.  Bulk was normal and fasciculations were absent.   Motor Tone - Muscle tone was assessed at the neck and appendages and was normal.  Reflexes - The patient's reflexes were normal in all extremities and she had no pathological reflexes.  Sensory - Light touch, temperature/pinprick were assessed and were decreased on the right upper extremity.    Coordination - The patient had normal movements in the hands  and feet with no ataxia or dysmetria.  Tremor was absent.  Gait and Station - mild right hemiparetic gait.  Data reviewed: I personally reviewed the images and agree with the radiology interpretations.  Ct Head Wo Contrast  10/02/2014    IMPRESSION: No acute intracranial abnormalities. Chronic atrophy and small vessel ischemic changes.   07/29/14 1. Mildly increased size of the intra-axial hemorrhage located in the left thalamus near the posterior limb of the left internal capsule. Mild regional mass effect is stable. 2. Small volume left lateral intraventricular extension, no ventriculomegaly. 3. No new intracranial abnormality identified.  07/28/14 Acute left posterior limb of the internal capsule hemorrhage measuring 18 mm. Intraventricular extension.  Mr Brain Wo Contrast  10/03/2014    IMPRESSION: No acute intracranial process, specifically no evidence of acute ischemia.  Remote LEFT thalamus hemorrhagic infarct with resolution of edema. A few additional subcentimeter foci of susceptibility artifact may reflect micro hemorrhages from hypertension though was not are nonspecific. These are more conspicuous on today's examination which is likely technical.  Moderate to severe white matter changes can be seen with chronic small vessel ischemic disease.     07/30/14  1. Acute/subacute left lie make hemorrhage without evidence for an underlying mass. 2. Atrophy and moderate diffuse white matter disease. 3. 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 4. Moderate more distal PCA branch vessel disease bilaterally.  2D ehco - Left ventricle: The cavity size was normal. Wall thickness was increased in a pattern of mild LVH. Systolic function was vigorous. The estimated ejection fraction was in the range of 65% to 70%. Wall motion was normal; there were no regional wall motion abnormalities. Doppler parameters are consistent with abnormal left ventricular relaxation (grade 1 diastolic dysfunction). The E/e&' ratio is between 8-15, suggesting indeterminate LV filling pressure. - Left atrium: The atrium was normal in size. - Right atrium: The  atrium was mildly dilated. - Atrial septum: There was increased thickness of the septum, consistent with lipomatous hypertrophy.  Impressions: - LVEF 65-70%, mild LVH, normal wall motion, diastolic dysfunction with indeterminate LV filling pressure. Normal LA size, mild RAE, lipomatous hypertrophy of the IAS.  CUS - Findings suggest 1-39% internal carotid artery stenosis bilaterally. The left vertebral artery is patent with antegrade flow, unable to visualize the right vertebral artery.  Component     Latest Ref Rng 10/21/2014  Cholesterol     0 - 200 mg/dL 196  Triglycerides     0.0 - 149.0 mg/dL 92.0  HDL     >39.00 mg/dL 64.00  VLDL     0.0 - 40.0 mg/dL 18.4  LDL (calc)     0 - 99 mg/dL 114 (H)  Total CHOL/HDL Ratio      3  NonHDL      132.00  Hemoglobin A1C     4.6 - 6.5 % 6.8 (H)   Assessment: As you may recall, she is a 74 y.o. Caucasian female with PMH of HTN, DM, PVD was admitted in 07/2014 for right sided numbness and weakness. CT and MRI confirmed left basal ganglia bleeding extension to ventricles. Her blood pressure on admission was not high. She was sent to CIR and later home. She went back to ER on 10/03/2014 due to worsening right-sided numbness, CT showed resolution of left basal ganglia bleeding and MRI did not show acute stroke. Blood pressure was high at 166/97. Her worsening numbness consistent with recrudescence of old [name].  Currently patient still complained of right-sided numbness tingling, today blood pressure is still high 172/90. Will increase lisinopril to 20, asked her to check blood pressure home. Her recent LDL was 114, while at Lipitor. Not feel comfortable to start aspirin for stroke prevention at this time due to high blood pressure.  Plan:  - Increased lisinopril to 20 mg daily - Check blood pressure at home twice today for at least 2 weeks and record and bring over to PCP for medication adjustment - Add Lipitor 20 for high LDL - Hold off  aspirin for stroke prevention at this time due to high blood pressure. Will consider next visit - Follow up with your primary care physician for stroke risk factor modification. Recommend maintain blood pressure goal <130/80, diabetes with hemoglobin A1c goal below 6.5% and lipids with LDL cholesterol goal below 70 mg/dL.  - Recommend Diet control for borderline diabetes  - RTC in 2 months  No orders of the defined types were placed in this encounter.    Meds ordered this encounter  Medications  . lisinopril (PRINIVIL,ZESTRIL) 20 MG tablet    Sig: Take 1 tablet (20 mg total) by mouth daily.    Dispense:  90 tablet    Refill:  3  . atorvastatin (LIPITOR) 20 MG tablet    Sig: Take 1 tablet (20 mg total) by mouth daily.    Dispense:  90 tablet    Refill:  3    Patient Instructions  - your BP still high, I will increase your lisinopril to 20mg  once a day - check BP at home twice a day for 2 weeks at least and record and bring over to your PCP for medication adjustment - your LDL is high and I will start you on lipitor 20mg   - since your BP not in good control, I will hold off baby ASA this time for stroke prevention. Once your BP in good control, will consider addition of ASA - Follow up with your primary care physician for stroke risk factor modification. Recommend maintain blood pressure goal <130/80, diabetes with hemoglobin A1c goal below 6.5% and lipids with LDL cholesterol goal below 70 mg/dL.  - follow up in 2 months.   Rosalin Hawking, MD PhD [facility] [address] [place], [place] [address] [phone]

## 2014-11-02 ENCOUNTER — Telehealth: Payer: Self-pay | Admitting: Neurology

## 2014-11-02 DIAGNOSIS — I10 Essential (primary) hypertension: Secondary | ICD-10-CM

## 2014-11-02 DIAGNOSIS — E785 Hyperlipidemia, unspecified: Secondary | ICD-10-CM

## 2014-11-02 NOTE — Telephone Encounter (Signed)
Sherry Lambert - Tell her to stop taking the lipitor until Dr. Erlinda Hong can call her back. If she develops a rash, difficulty breathing, swelling in the face or lips or any other concerning symptoms she should proceed to the ED thanks

## 2014-11-02 NOTE — Telephone Encounter (Signed)
WID Dr Jaynee Eagles please advise

## 2014-11-02 NOTE — Telephone Encounter (Signed)
Spoke with patient and relayed Dr Cathren Laine message, informed patient that message will be forwarded to Dr Erlinda Hong as well. Patient understood and has stopped the Lipitor.

## 2014-11-02 NOTE — Telephone Encounter (Signed)
Pt is calling stating that she has had a reaction to atorvastatin (LIPITOR) 20 MG tablet.  It makes her itch terribly.  Please call and advise.

## 2014-11-04 MED ORDER — PRAVASTATIN SODIUM 20 MG PO TABS: 20.0000 mg | ORAL_TABLET | Freq: Every day | ORAL | Status: DC

## 2014-11-04 NOTE — Telephone Encounter (Signed)
Discussed with patient over the phone. She stated that after she took Lipitor for a week she started to have itching on her arms and legs which she could not able to tolerate. Lipitor has been stopped and after one day of discontinuation of the medication, the itchiness was gone. Currently she is back to her baseline.  She still guarded about her hyperlipidemia, and we talked about low potency and the low side effect profile medications such as pravastatin and lovastatin, and she agrees to try pravastatin. Medication prescribed through pharmacy. She will let us know if there is any further problem with pravastatin.  She also mentioned that her blood pressure is still high around 145, I have increased her lisinopril to 20 mg daily last visit. And this time I asked her to take 20 mg in the morning and 10 mg in the afternoon and continue to check blood pressure. He is still high, she will need to talk to her PCP Dr. Doug Sou for further recommendation. She expressed understanding and appreciation.  Rosalin Hawking, MD PhD Stroke Neurology 11/04/2014 3:30 PM

## 2014-11-15 ENCOUNTER — Ambulatory Visit: Payer: Medicare HMO | Admitting: Internal Medicine

## 2014-12-16 ENCOUNTER — Encounter: Payer: Self-pay | Admitting: Internal Medicine

## 2014-12-16 ENCOUNTER — Ambulatory Visit (INDEPENDENT_AMBULATORY_CARE_PROVIDER_SITE_OTHER): Payer: Medicare HMO | Admitting: Internal Medicine

## 2014-12-16 VITALS — BP 148/78 | HR 98 | Temp 97.9°F | Resp 20 | Ht 60.0 in | Wt 130.8 lb

## 2014-12-16 DIAGNOSIS — R208 Other disturbances of skin sensation: Secondary | ICD-10-CM | POA: Diagnosis not present

## 2014-12-16 DIAGNOSIS — R209 Unspecified disturbances of skin sensation: Secondary | ICD-10-CM

## 2014-12-16 DIAGNOSIS — I69898 Other sequelae of other cerebrovascular disease: Secondary | ICD-10-CM

## 2014-12-16 DIAGNOSIS — I69398 Other sequelae of cerebral infarction: Secondary | ICD-10-CM

## 2014-12-16 DIAGNOSIS — I739 Peripheral vascular disease, unspecified: Secondary | ICD-10-CM

## 2014-12-16 NOTE — Progress Notes (Signed)
Pre visit review using our clinic review tool, if applicable. No additional management support is needed unless otherwise documented below in the visit note. 

## 2014-12-16 NOTE — Patient Instructions (Signed)
We will check on the blood flow in the legs and you should hear back about that.   Come back in about 5 months so we can check on the cholesterol and the liver.

## 2014-12-18 NOTE — Assessment & Plan Note (Signed)
She is doing slightly better on her face. Still with numbness and tingling on her right side. She is not on aspirin yet (due to the [place]) and following with neurology. BP at goal today and sugars on goal.

## 2014-12-18 NOTE — Progress Notes (Signed)
   Subjective:    Patient ID: Sherry Lambert, female    DOB: 1940/09/19, 74 y.o.   MRN: [id number]  HPI The patient is a 74 YO female who is coming in for purple toe on her right foot. She has had similar problem in the past with her left foot. She was evaluated by vascular surgery at the time and did not have significant blockages in either leg in the larger arteries. She did have adequate circulation at that time. She still has intermittent changes in color of the toes of the left foot. In the last 3 months this has started happening on the right foot. She just wants to be sure that nothing has changed. It is not painful and she does not have good feeling in her feet overall. Denies cold foot or pain. No swelling or her foot or ankle. No recent injury. She is still having numbness on her right side although her face is feeling more normal again. She wants to know if that will get better (since her stroke). She denies new stroke symptoms. Coordination and speech is better and almost normal now. Has followed up with the neurologist.   Review of Systems  Constitutional: Negative for fever, chills, activity change, appetite change, fatigue and unexpected weight change.  HENT: Negative.   Eyes: Negative.   Respiratory: Negative for cough, chest tightness, shortness of breath and wheezing.   Cardiovascular: Negative for chest pain, palpitations and leg swelling.  Gastrointestinal: Negative for nausea, abdominal pain, diarrhea, constipation and abdominal distention.  Musculoskeletal: Positive for gait problem. Negative for myalgias, back pain and arthralgias.  Skin: Negative.   Neurological: Positive for numbness. Negative for dizziness, seizures, speech difficulty, weakness, light-headedness and headaches.      Objective:   Physical Exam  Constitutional: She is oriented to person, place, and time. She appears well-developed and well-nourished.  HENT:  Head: Normocephalic and atraumatic.  Eyes: EOM  are normal.  Neck: Normal range of motion.  Cardiovascular: Normal rate and regular rhythm.   Pulmonary/Chest: Breath sounds normal. No respiratory distress. She has no wheezes. She has no rales.  Abdominal: Soft. Bowel sounds are normal. She exhibits no distension. There is no tenderness. There is no rebound.  Musculoskeletal: She exhibits no edema.  Neurological: She is alert and oriented to person, place, and time. Coordination normal.  Skin: Skin is warm and dry.  Poor peripheral capillary refill, one toe purple right foot (2nd toe), 2nd toe red on the left foot, no signs gangrene or injury. PT 1+ bilaterally, feet warm to touch.    Filed Vitals:   12/16/14 1539 12/16/14 1607  BP: 182/90 148/78  Pulse: 98   Temp: 97.9 F (36.6 C)   TempSrc: Oral   Resp: 20   Height: 5' (1.524 m)   Weight: 130 lb 12.8 oz (59.33 kg)   SpO2: 95%       Assessment & Plan:

## 2014-12-18 NOTE — Assessment & Plan Note (Signed)
Reassured her that her small vessel disease is likely in both feet. Non-smoker. No claudication. Has had screening before. Will recheck ABI to see if it is decreasing (if decreased may be evidence of new blockages). Normal temperature of the feet and overall no ulcers or callusing on the feet.

## 2014-12-20 ENCOUNTER — Encounter: Payer: Self-pay | Admitting: Neurology

## 2014-12-20 ENCOUNTER — Ambulatory Visit (INDEPENDENT_AMBULATORY_CARE_PROVIDER_SITE_OTHER): Payer: Medicare HMO | Admitting: Neurology

## 2014-12-20 VITALS — BP 158/80 | HR 82 | Ht 60.0 in | Wt 137.4 lb

## 2014-12-20 DIAGNOSIS — R7309 Other abnormal glucose: Secondary | ICD-10-CM | POA: Diagnosis not present

## 2014-12-20 DIAGNOSIS — E785 Hyperlipidemia, unspecified: Secondary | ICD-10-CM | POA: Diagnosis not present

## 2014-12-20 DIAGNOSIS — I610 Nontraumatic intracerebral hemorrhage in hemisphere, subcortical: Secondary | ICD-10-CM | POA: Diagnosis not present

## 2014-12-20 DIAGNOSIS — I10 Essential (primary) hypertension: Secondary | ICD-10-CM | POA: Diagnosis not present

## 2014-12-20 DIAGNOSIS — R7303 Prediabetes: Secondary | ICD-10-CM

## 2014-12-20 MED ORDER — ASPIRIN EC 81 MG PO TBEC: 81.0000 mg | DELAYED_RELEASE_TABLET | Freq: Every day | ORAL | Status: AC

## 2014-12-20 NOTE — Patient Instructions (Signed)
-   will start ASA 81mg  daily - continue pravastatin for stroke prevention - check BP at home - Follow up with your primary care physician for stroke risk factor modification. Recommend maintain blood pressure goal <130/80, diabetes with hemoglobin A1c goal below 6.5% and lipids with LDL cholesterol goal below 70 mg/dL.  - follow up with ophthalmology for right eye itching.  - follow up in 6 months.

## 2014-12-20 NOTE — Progress Notes (Signed)
STROKE NEUROLOGY FOLLOW UP NOTE  NAME: Sherry Lambert DOB: 16-Apr-1941  REASON FOR VISIT: stroke follow up HISTORY FROM: chart and pt  Today we had the pleasure of seeing Sherry Lambert in follow-up at our Neurology Clinic. Pt was accompanied by son.   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 CT showed acute left thalamic hemorrhage with ventricular extension. Subsequent MRI confirmed the hemorrhage but no evidence of tumor or AVM. MRA showed diffuse athero. She was stabilized in hospital and later discharged to CIR.    She was sent home after rehab with residual right sided paresthesias. However, she was seen again in ER on 10/03/14 due to worsening of her baseline paresthesias on the right side. Exam pertinent for decreased LT on the right side but otherwise unremarkable. Head CT imaging showed resolution of [name] and MRI showed no acute stroke. Her BP was high at 166/97. Suspect symptoms represent exacerbation of prior stroke symptoms.   10/26/14 follow up - the patient has been doing the same. She still complains of right arm and leg numbness and tingling. She is able to walk without any assistance. She is taking only 10mg  lisinopril and her BP was high today at 172/90. She recently visited her PCP and had blood test showed LDL 114 and A1C 6.8.  Interval History During the interval time, the pt has been doing well. She still has intermittent right arm and leg numbness and tingling, especially if she over exerts herself. Her BP at home is around 130-140. She is on lisinopril 20mg  in am and 10mg  in pm. Today in clinic her BP 158/80, and she said she feels nervous. She is not tolerating lipitor which makes her itchy, so it was changed to pravastatin, so far she tolerating well. She complains of right eye and eyelid itchy after stroke. She had cataract surgery of right eye in the past and has not seen  ophthalmology yet.   REVIEW OF SYSTEMS: Full 14 system review of systems performed and notable only for those listed below and in HPI above, all others are negative:  Constitutional:   Cardiovascular:  Ear/Nose/Throat:   Skin:  Eyes:  Eye itching Respiratory:   Gastroitestinal:   Genitourinary:  Hematology/Lymphatic:   Endocrine:  Musculoskeletal:   Allergy/Immunology:   Neurological:  Numbness Psychiatric:  Sleep:   The following represents the patient's updated allergies and side effects list: Allergies  Allergen Reactions  . Lipitor [Atorvastatin] Itching    The neurologically relevant items on the patient's problem list were reviewed on today's visit.  Neurologic Examination  A problem focused neurological exam (12 or more points of the single system neurologic examination, vital signs counts as 1 point, cranial nerves count for 8 points) was performed.  Blood pressure 158/80, pulse 82, height 5' (1.524 m), weight 137 lb 6.4 oz (62.324 kg).  General - Well nourished, well developed, in no apparent distress.  Ophthalmologic - Sharp disc margins OU.  Cardiovascular - Regular rate and rhythm with no murmur.  Mental Status -  Level of arousal and orientation to time, place, and person were intact. Language including expression, naming, repetition, comprehension was assessed and found intact.  Cranial Nerves II - XII - II - Visual field intact OU. III, IV, VI - Extraocular movements intact. V - Facial sensation intact bilaterally. VII - Facial movement intact bilaterally. VIII - Hearing & vestibular intact bilaterally.  X - Palate elevates symmetrically. XI - Chin turning & shoulder shrug intact bilaterally. XII - Tongue protrusion intact.  Motor Strength - The patient's strength was normal in all extremities and pronator drift was absent.  Bulk was normal and fasciculations were absent.   Motor Tone - Muscle tone was assessed at the neck and appendages and was  normal.  Reflexes - The patient's reflexes were normal in all extremities and she had no pathological reflexes.  Sensory - Light touch, temperature/pinprick were assessed and were decreased on the right upper and lower extremities.    Coordination - The patient had normal movements in the hands and feet with no ataxia or dysmetria.  Tremor was absent.  Gait and Station - subtle right hemiparetic gait.  Data reviewed: I personally reviewed the images and agree with the radiology interpretations.  Ct Head Wo Contrast  10/02/2014   IMPRESSION: No acute intracranial abnormalities. Chronic atrophy and small vessel ischemic changes.   07/29/14 1. Mildly increased size of the intra-axial hemorrhage located in the left thalamus near the posterior limb of the left internal capsule. Mild regional mass effect is stable. 2. Small volume left lateral intraventricular extension, no ventriculomegaly. 3. No new intracranial abnormality identified.  07/28/14 Acute left posterior limb of the internal capsule hemorrhage measuring 18 mm. Intraventricular extension.  Mr Brain Wo Contrast  10/03/2014    IMPRESSION: No acute intracranial process, specifically no evidence of acute ischemia.  Remote LEFT thalamus hemorrhagic infarct with resolution of edema. A few additional subcentimeter foci of susceptibility artifact may reflect micro hemorrhages from hypertension though was not are nonspecific. These are more conspicuous on today's examination which is likely technical.  Moderate to severe white matter changes can be seen with chronic small vessel ischemic disease.     07/30/14  1. Acute/subacute left lie make hemorrhage without evidence for an underlying mass. 2. Atrophy and moderate diffuse white matter disease. 3. 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 4. Moderate more distal PCA branch vessel disease bilaterally.  2D ehco -  Left ventricle: The cavity size was normal. Wall thickness was increased in a pattern of mild LVH. Systolic function was vigorous. The estimated ejection fraction was in the range of 65% to 70%. Wall motion was normal; there were no regional wall motion abnormalities. Doppler parameters are consistent with abnormal left ventricular relaxation (grade 1 diastolic dysfunction). The E/e&' ratio is between 8-15, suggesting indeterminate LV filling pressure. - Left atrium: The atrium was normal in size. - Right atrium: The atrium was mildly dilated. - Atrial septum: There was increased thickness of the septum, consistent with lipomatous hypertrophy.  Impressions: - LVEF 65-70%, mild LVH, normal wall motion, diastolic dysfunction with indeterminate LV filling pressure. Normal LA size, mild RAE, lipomatous hypertrophy of the IAS.  CUS - Findings suggest 1-39% internal carotid artery stenosis bilaterally. The left vertebral artery is patent with antegrade flow, unable to visualize the right vertebral artery.  Component     Latest Ref Rng 10/21/2014  Cholesterol     0 - 200 mg/dL 196  Triglycerides     0.0 - 149.0 mg/dL 92.0  HDL     >39.00 mg/dL 64.00  VLDL     0.0 - 40.0 mg/dL 18.4  LDL (calc)     0 - 99 mg/dL 114 (H)  Total CHOL/HDL Ratio      3  NonHDL      132.00  Hemoglobin A1C  4.6 - 6.5 % 6.8 (H)   Assessment: As you may recall, she is a 74 y.o. Caucasian female with PMH of HTN, DM, PVD was admitted in 07/2014 for right sided numbness and weakness. CT and MRI confirmed left basal ganglia bleeding extension to ventricles. Her blood pressure on admission was not high. She was sent to CIR and later home. She went back to ER on 10/03/2014 due to worsening right-sided numbness, CT showed resolution of left basal ganglia bleeding and MRI did not show acute stroke. Blood pressure was high at 166/97. Her worsening numbness consistent with recrudescence of old [name].  Currently patient still complained of right-sided numbness tingling, especially after over exertion. Her BP better controlled this time and currently on lisinopril 20/10. Also on pravastatin. Will put on ASA this time.  Plan:  - start ASA 81mg  daily for stroke prevention - continue pravastatin for stroke prevention - check BP at home - Follow up with your primary care physician for stroke risk factor modification. Recommend maintain blood pressure goal <130/80, diabetes with hemoglobin A1c goal below 6.5% and lipids with LDL cholesterol goal below 70 mg/dL.  - follow up with ophthalmology for right eye itching.  - RTC in 6 months.  No orders of the defined types were placed in this encounter.    Meds ordered this encounter  Medications  . aspirin EC 81 MG tablet    Sig: Take 1 tablet (81 mg total) by mouth daily.    Dispense:  90 tablet    Refill:  3    Patient Instructions  - will start ASA 81mg  daily - continue pravastatin for stroke prevention - check BP at home - Follow up with your primary care physician for stroke risk factor modification. Recommend maintain blood pressure goal <130/80, diabetes with hemoglobin A1c goal below 6.5% and lipids with LDL cholesterol goal below 70 mg/dL.  - follow up with ophthalmology for right eye itching.  - follow up in 6 months.    Rosalin Hawking, MD PhD [facility] [address], [place], [place] [address] [phone]

## 2014-12-24 ENCOUNTER — Ambulatory Visit
Admission: RE | Admit: 2014-12-24 | Discharge: 2014-12-24 | Disposition: A | Discharge status: Home / Self Care | Payer: Medicare HMO | Source: Ambulatory Visit | Attending: Internal Medicine | Admitting: Internal Medicine

## 2014-12-24 DIAGNOSIS — I739 Peripheral vascular disease, unspecified: Secondary | ICD-10-CM

## 2014-12-24 DIAGNOSIS — R1011 Right upper quadrant pain: Secondary | ICD-10-CM

## 2015-01-10 ENCOUNTER — Encounter: Payer: Self-pay | Admitting: Internal Medicine

## 2015-03-31 ENCOUNTER — Telehealth: Payer: Self-pay | Admitting: Neurology

## 2015-03-31 ENCOUNTER — Telehealth: Payer: Self-pay | Admitting: Internal Medicine

## 2015-03-31 DIAGNOSIS — I610 Nontraumatic intracerebral hemorrhage in hemisphere, subcortical: Secondary | ICD-10-CM

## 2015-03-31 MED ORDER — GABAPENTIN 100 MG PO CAPS: 100.0000 mg | ORAL_CAPSULE | Freq: Three times a day (TID) | ORAL | Status: DC

## 2015-03-31 NOTE — Telephone Encounter (Signed)
Spoke with pt concerning her numbness and pain in her right foot, she states there is not pain, swelling or numbness and legs only right foot.  She is questioning if TED hose will help with problem.

## 2015-03-31 NOTE — Telephone Encounter (Signed)
Would this patient benefit from support hose?

## 2015-03-31 NOTE — Telephone Encounter (Signed)
Pt called and states that her leg feels funny all the way to her toes,right leg. She wants to know if wearing support hose will help. Please call and advise

## 2015-03-31 NOTE — Telephone Encounter (Signed)
Called pt back. She stated that she has numbness tingling in her right upper leg and right foot, also right arm, as well as on and off in her right face. Left side no problem. Her numbness tingling still likely due to her left BG [name]. Will prescribe gabapentin starting low dose. TED hose would not help in this case. Pt expressed understanding and appreciation.  Rosalin Hawking, MD PhD Stroke Neurology 03/31/2015 5:53 PM

## 2015-03-31 NOTE — Telephone Encounter (Signed)
Patient would like to know if support hose would help her.

## 2015-04-01 NOTE — Telephone Encounter (Signed)
She can try them, they will not hurt her.

## 2015-04-01 NOTE — Telephone Encounter (Signed)
Patient aware.

## 2015-04-18 ENCOUNTER — Telehealth: Payer: Self-pay | Admitting: Neurology

## 2015-04-18 NOTE — Telephone Encounter (Signed)
Pt would like a call back about Gabapentin. "doesn't have as much control over her legs, wonders is this is how it should be, questions like that"

## 2015-04-18 NOTE — Telephone Encounter (Signed)
Called pt over the phone. Pt has been on gabapentin for 3 weeks now and said her arm and leg still tingling. I recommend her to increased gabapentin dose to give a try as her dose is still very low dose now. She agrees to give a try. I also told her that if she is not able to tolerate 200mg  tid, she can always go back to 100mg  tid. She expressed understanding and appreciation.  Rosalin Hawking, MD PhD Stroke Neurology 04/18/2015 6:12 PM

## 2015-04-21 ENCOUNTER — Ambulatory Visit (INDEPENDENT_AMBULATORY_CARE_PROVIDER_SITE_OTHER): Payer: Medicare HMO | Admitting: Internal Medicine

## 2015-04-21 ENCOUNTER — Encounter: Payer: Self-pay | Admitting: Internal Medicine

## 2015-04-21 ENCOUNTER — Other Ambulatory Visit (INDEPENDENT_AMBULATORY_CARE_PROVIDER_SITE_OTHER): Payer: Medicare HMO

## 2015-04-21 VITALS — BP 128/88 | HR 78 | Temp 97.9°F | Resp 16 | Ht 61.0 in | Wt 125.8 lb

## 2015-04-21 DIAGNOSIS — E119 Type 2 diabetes mellitus without complications: Secondary | ICD-10-CM

## 2015-04-21 DIAGNOSIS — R208 Other disturbances of skin sensation: Secondary | ICD-10-CM

## 2015-04-21 DIAGNOSIS — I10 Essential (primary) hypertension: Secondary | ICD-10-CM | POA: Diagnosis not present

## 2015-04-21 DIAGNOSIS — I69898 Other sequelae of other cerebrovascular disease: Secondary | ICD-10-CM

## 2015-04-21 DIAGNOSIS — R209 Unspecified disturbances of skin sensation: Secondary | ICD-10-CM

## 2015-04-21 DIAGNOSIS — I69398 Other sequelae of cerebral infarction: Secondary | ICD-10-CM

## 2015-04-21 LAB — LIPID PANEL
CHOL/HDL RATIO: 2
Cholesterol: 154 mg/dL (ref 0–200)
HDL: 68 mg/dL (ref >39.00)
LDL CALC: 71 mg/dL (ref 0–99)
NONHDL: 85.75
TRIGLYCERIDES: 76 mg/dL (ref 0.0–149.0)
VLDL: 15.2 mg/dL (ref 0.0–40.0)

## 2015-04-21 LAB — BASIC METABOLIC PANEL
BUN: 12 mg/dL (ref 6–23)
CALCIUM: 10.4 mg/dL (ref 8.4–10.5)
CO2: 32 meq/L (ref 19–32)
Chloride: 105 mEq/L (ref 96–112)
Creatinine, Ser: 0.73 mg/dL (ref 0.40–1.20)
GFR: 83.09 mL/min (ref >60.00)
Glucose, Bld: 85 mg/dL (ref 70–99)
Potassium: 4.6 mEq/L (ref 3.5–5.1)
SODIUM: 141 meq/L (ref 135–145)

## 2015-04-21 LAB — HEMOGLOBIN A1C: HEMOGLOBIN A1C: 6.5 % (ref 4.6–6.5)

## 2015-04-21 MED ORDER — PREGABALIN 50 MG PO CAPS: 50.0000 mg | ORAL_CAPSULE | Freq: Two times a day (BID) | ORAL | Status: DC

## 2015-04-21 NOTE — Progress Notes (Signed)
   Subjective:    Patient ID: Sherry Lambert, female    DOB: 01-Jun-1941, 74 y.o.   MRN: [id number]  HPI The patient is a 74 YO female coming in for some numbness from her stroke. Her neurologist has started her on gabapentin. She has not had any improvement and actually she has had worsening of her numbness. This has made her unable to drive. She is curious if this is supposed to heal her nerves and she should keep taking them. No other new problems and no new stroke-like symptoms. She is not able to exercise and wants to know if her cholesterol is better on the medicine. She is only taking 1/2 pill daily of it now.   Review of Systems  Constitutional: Negative for fever, chills, activity change, appetite change, fatigue and unexpected weight change.  Respiratory: Negative for cough, chest tightness, shortness of breath and wheezing.   Cardiovascular: Negative for chest pain, palpitations and leg swelling.  Gastrointestinal: Negative for nausea, abdominal pain, diarrhea, constipation and abdominal distention.  Musculoskeletal: Positive for gait problem. Negative for myalgias, back pain and arthralgias.  Skin: Negative.   Neurological: Positive for numbness. Negative for dizziness, seizures, speech difficulty, weakness, light-headedness and headaches.      Objective:   Physical Exam  Constitutional: She is oriented to person, place, and time. She appears well-developed and well-nourished.  HENT:  Head: Normocephalic and atraumatic.  Eyes: EOM are normal.  Neck: Normal range of motion.  Cardiovascular: Normal rate and regular rhythm.   Pulmonary/Chest: Breath sounds normal. No respiratory distress. She has no wheezes. She has no rales.  Abdominal: Soft. Bowel sounds are normal. She exhibits no distension. There is no tenderness. There is no rebound.  Musculoskeletal: She exhibits no edema.  Neurological: She is alert and oriented to person, place, and time. Coordination normal.  Skin: Skin  is warm and dry.   Filed Vitals:   04/21/15 1304 04/21/15 1331  BP: 152/82 128/88  Pulse: 78   Temp: 97.9 F (36.6 C)   TempSrc: Oral   Resp: 16   Height: 5\' 1"  (1.549 m)   Weight: 125 lb 12.8 oz (57.063 kg)   SpO2: 95%       Assessment & Plan:

## 2015-04-21 NOTE — Progress Notes (Signed)
Pre visit review using our clinic review tool, if applicable. No additional management support is needed unless otherwise documented below in the visit note. 

## 2015-04-21 NOTE — Patient Instructions (Signed)
We will try lyrica for the nerve pain in your foot and arm. Take 1 pill twice a day and if you are not feeling better in 1 week you can increase to 100 mg twice a day (2 pills twice a day).   We are checking the labs today and will call you back with the results.   Exercise to Stay Healthy Exercise helps you become and stay healthy. EXERCISE IDEAS AND TIPS Choose exercises that:  You enjoy.  Fit into your day. You do not need to exercise really hard to be healthy. You can do exercises at a slow or medium level and stay healthy. You can:  Stretch before and after working out.  Try yoga, Pilates, or tai chi.  Lift weights.  Walk fast, swim, jog, run, climb stairs, bicycle, dance, or rollerskate.  Take aerobic classes. Exercises that burn about 150 calories:  Running 1  miles in 15 minutes.  Playing volleyball for 45 to 60 minutes.  Washing and waxing a car for 45 to 60 minutes.  Playing touch football for 45 minutes.  Walking 1  miles in 35 minutes.  Pushing a stroller 1  miles in 30 minutes.  Playing basketball for 30 minutes.  Raking leaves for 30 minutes.  Bicycling 5 miles in 30 minutes.  Walking 2 miles in 30 minutes.  Dancing for 30 minutes.  Shoveling snow for 15 minutes.  Swimming laps for 20 minutes.  Walking up stairs for 15 minutes.  Bicycling 4 miles in 15 minutes.  Gardening for 30 to 45 minutes.  Jumping rope for 15 minutes.  Washing windows or floors for 45 to 60 minutes. Document Released: 09/22/2010 Document Revised: 11/12/2011 Document Reviewed: 09/22/2010 [facility] Patient Information 2015 [place], [place]. This information is not intended to replace advice given to you by your health care provider. Make sure you discuss any questions you have with your health care provider.

## 2015-04-22 NOTE — Assessment & Plan Note (Signed)
Checking HgA1c today. She is on ACE-I and not on treatment yet. Diet controlled. She is not able to exercise and we talked again about the importance. Needs yearly eye exam. Feet without ulcerations today. Add medication if needed.

## 2015-04-22 NOTE — Assessment & Plan Note (Signed)
Since she has not had response to gabapentin (although not on truly therapeutic dosing) and has had negative response will switch to lyrica. Start 50 mg BID and after 2 days increase to 100 mg BID as needed for the neuropathy. Talked again with her about the fact that her nerves may never go back to normal and this may be her new normal.

## 2015-04-22 NOTE — Assessment & Plan Note (Addendum)
BP at goal on lisinopril, checking BMP today. Continue lisinopril daily.

## 2015-05-04 ENCOUNTER — Other Ambulatory Visit: Payer: Self-pay | Admitting: Neurology

## 2015-05-05 ENCOUNTER — Other Ambulatory Visit: Payer: Self-pay

## 2015-05-05 MED ORDER — PRAVASTATIN SODIUM 20 MG PO TABS: 20.0000 mg | ORAL_TABLET | Freq: Every day | ORAL | Status: DC

## 2015-05-05 NOTE — Telephone Encounter (Signed)
Pharmacy requests 90 day Rx  

## 2015-05-25 ENCOUNTER — Telehealth: Payer: Self-pay

## 2015-05-25 NOTE — Telephone Encounter (Signed)
PA completed and Medicare denied Lyrica.  Is there an alternative?

## 2015-05-27 NOTE — Telephone Encounter (Signed)
Lyrica Approved through 09/03/2015 per [employer] fax. Pharmacy notified

## 2015-06-21 ENCOUNTER — Encounter: Payer: Self-pay | Admitting: Internal Medicine

## 2015-06-21 ENCOUNTER — Other Ambulatory Visit (INDEPENDENT_AMBULATORY_CARE_PROVIDER_SITE_OTHER): Payer: Medicare HMO

## 2015-06-21 ENCOUNTER — Ambulatory Visit (INDEPENDENT_AMBULATORY_CARE_PROVIDER_SITE_OTHER): Payer: Medicare HMO | Admitting: Internal Medicine

## 2015-06-21 VITALS — BP 160/90 | HR 76 | Temp 98.1°F | Resp 16 | Wt 126.0 lb

## 2015-06-21 DIAGNOSIS — M7989 Other specified soft tissue disorders: Secondary | ICD-10-CM

## 2015-06-21 DIAGNOSIS — R3989 Other symptoms and signs involving the genitourinary system: Secondary | ICD-10-CM | POA: Diagnosis not present

## 2015-06-21 DIAGNOSIS — Z23 Encounter for immunization: Secondary | ICD-10-CM

## 2015-06-21 DIAGNOSIS — R39198 Other difficulties with micturition: Secondary | ICD-10-CM

## 2015-06-21 LAB — COMPREHENSIVE METABOLIC PANEL
ALBUMIN: 4 g/dL (ref 3.5–5.2)
ALK PHOS: 73 U/L (ref 39–117)
ALT: 11 U/L (ref 0–35)
AST: 15 U/L (ref 0–37)
BUN: 13 mg/dL (ref 6–23)
CALCIUM: 9.9 mg/dL (ref 8.4–10.5)
CHLORIDE: 105 meq/L (ref 96–112)
CO2: 31 mEq/L (ref 19–32)
Creatinine, Ser: 0.81 mg/dL (ref 0.40–1.20)
GFR: 73.66 mL/min (ref >60.00)
Glucose, Bld: 98 mg/dL (ref 70–99)
POTASSIUM: 3.7 meq/L (ref 3.5–5.1)
Sodium: 140 mEq/L (ref 135–145)
TOTAL PROTEIN: 7.1 g/dL (ref 6.0–8.3)
Total Bilirubin: 0.4 mg/dL (ref 0.2–1.2)

## 2015-06-21 LAB — URINALYSIS, MICROSCOPIC ONLY

## 2015-06-21 NOTE — Patient Instructions (Addendum)
  We have reviewed your prior records including labs and tests today.  Test(s) ordered today. Your results will be released to [place] (or called to you) after review, usually within 72hours after test completion. If any changes need to be made, you will be notified at that same time.   Flu vaccine administered today.   Medications reviewed and updated.  No changes recommended at this time.

## 2015-06-21 NOTE — Progress Notes (Signed)
Pre visit review using our clinic review tool, if applicable. No additional management support is needed unless otherwise documented below in the visit note. 

## 2015-06-21 NOTE — Progress Notes (Signed)
Subjective:    Patient ID: Sherry Lambert, female    DOB: [birth year], 74 y.o.   MRN: [id number]  HPI She is here because of leg swelling, leg pain and change in urination.  Stroke:  Just under a year she had a stroke.  She has residual tingling in her right arm and leg.  She had hand and foot weakness from the stroke.  She has some good days and bad days.  Her toes hurt since the stroke. She was on gabapentin but had itching and bumps on her skin and stopped the medication.  She thinks the tingling was better on the medication.  She was prescribed lyrica and does not want to take it.  Her arm feels heavy but there is no weakness.   Right leg pain:  She has had right leg pain for about one month.  This started after doing new exercises.  It hurts more with exercising and is better with rest.  She feels the pain is related to specific exercises.  Her urine has changed about three weeks.  Her urine flow is slow.  She sometimes has to lean forward to get the urine out.  She sometimes has increased frequency and sometimes only has to go only a small amount.  She denies dysuria and hematuria.    Leg swelling:  She has had leg swelling for about one month.  It is worse at night and no swelling first thing in the morning.  No change in diet.  She denies changes in medication.  Medications and allergies reviewed with patient and updated if appropriate.  Patient Active Problem List   Diagnosis Date Noted  . Essential hypertension 12/20/2014  . Intracerebral hemorrhage ([place]) 10/26/2014  . Routine general medical examination at a health care facility 10/22/2014  . Abdominal pain, right upper quadrant 10/08/2014  . 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 Peripheral arterial disease ([place]) 01/05/2014  . Osteopenia 11/12/2013  . Dyslipidemia 09/19/2013  . DM type 2 goal A1C below 7.5 11/10/2012  . HTN (hypertension) 11/10/2012  . Other emphysema ([place])  11/10/2012    Past Medical History  Diagnosis Date  . Emphysema of lung   . GERD (gastroesophageal reflux disease)   . Hypertension   . [name]   . [name]   . Peripheral arterial disease   . Diabetes mellitus without complication     Past Surgical History  Procedure Laterality Date  . Tonsillectomy    . Abdominal hysterectomy    . Colonoscopy    . Esophagogastroduodenoscopy N/A 12/23/2012    Procedure: ESOPHAGOGASTRODUODENOSCOPY (EGD);  Surgeon: Inda Castle, MD;  Location: [facility];  Service: Endoscopy;  Laterality: N/A;  . Balloon dilation N/A 12/23/2012    Procedure: BALLOON DILATION;  Surgeon: Inda Castle, MD;  Location: [facility];  Service: Endoscopy;  Laterality: N/A;  . Esophagogastroduodenoscopy N/A 01/15/2013    Procedure: ESOPHAGOGASTRODUODENOSCOPY (EGD);  Surgeon: Inda Castle, MD;  Location: [facility];  Service: Endoscopy;  Laterality: N/A;  . Balloon dilation N/A 01/15/2013    Procedure: BALLOON DILATION;  Surgeon: Inda Castle, MD;  Location: [facility];  Service: Endoscopy;  Laterality: N/A;    Social History   Social History  . Marital Status: Divorced    Spouse Name: N/A  . Number of Children: 1  . Years of Education: N/A   Occupational History  . Retired    Social History Main Topics  .  Smoking status: Former Smoker -- 55 years    Types: Cigarettes  . Smokeless tobacco: Never Used  . Alcohol Use: No  . Drug Use: No  . Sexual Activity: No   Other Topics Concern  . Not on file   Social History Narrative   Regular exercise-no   Caffeine Use-yes    Review of Systems  Constitutional: Negative for fever and chills.  Respiratory: Negative for cough, shortness of breath and wheezing.   Cardiovascular: Positive for leg swelling. Negative for chest pain and palpitations.  Gastrointestinal: Negative for nausea and abdominal pain.  Genitourinary: Positive for difficulty urinating. Negative for dysuria and  hematuria.  Musculoskeletal: Negative for back pain.  Neurological: Negative for dizziness, light-headedness and headaches.       Objective:   Filed Vitals:   06/21/15 1306  BP: 160/90  Pulse: 76  Temp: 98.1 F (36.7 C)  Resp: 16   Filed Weights   06/21/15 1306  Weight: 126 lb (57.153 kg)   Body mass index is 23.82 kg/(m^2).   Physical Exam  Constitutional: She is oriented to person, place, and time. She appears well-developed and well-nourished.  HENT:  Head: Normocephalic and atraumatic.  Right Ear: External ear normal.  Left Ear: External ear normal.  Excessive cerumen in right ear canal  Neck: Neck supple. No tracheal deviation present. No thyromegaly present.  Cardiovascular: Normal rate, regular rhythm and normal heart sounds.   No murmur heard. Pulmonary/Chest: Effort normal and breath sounds normal. No respiratory distress. She has no wheezes.  Musculoskeletal: She exhibits edema (trace edema b/l LE).  Lymphadenopathy:    She has no cervical adenopathy.  Neurological: She is alert and oriented to person, place, and time. No cranial nerve deficit.  No weakness in b/l arms or legs; decreased sensation in arms and legs  Skin: Skin is warm and dry. No rash noted.  Psychiatric: She has a normal mood and affect. Her behavior is normal.          Assessment & Plan:   Flu vaccine today  Blood pressure, elevated: High here today, but she monitors it at home and it is well controlled No change in medication  Change in urination Difficulty urinating at times, no dysuria or hematuria Will rule out a UTI-will go to the lab for urinalysis, urine culture If there is no infection will refer to urology for further evaluation  Leg swelling Mild and worse in the evening Possible venous insufficiency No change in medications or diet Will check CMP to rule out change in kidney function or liver tests, but both were normal 2 months ago Elevate legs when  sitting Low-sodium diet  History of stroke Residual tingling in the right arm and leg No weakness on exam Symptoms worsened since stopping the gabapentin Since there is no weakness or focal neurological deficits no further evaluation at this time She has a follow-up with neurology later this month She does not want to take Lyrica because of possible side effects and the expense Advised follow-up if her symptoms change

## 2015-06-22 LAB — URINE CULTURE

## 2015-06-23 MED ORDER — CEPHALEXIN 500 MG PO CAPS: 500.0000 mg | ORAL_CAPSULE | Freq: Two times a day (BID) | ORAL | Status: DC

## 2015-07-04 ENCOUNTER — Ambulatory Visit (INDEPENDENT_AMBULATORY_CARE_PROVIDER_SITE_OTHER): Payer: Medicare HMO | Admitting: Neurology

## 2015-07-04 ENCOUNTER — Encounter: Payer: Self-pay | Admitting: Neurology

## 2015-07-04 VITALS — BP 149/73 | HR 77 | Ht 60.0 in | Wt 128.4 lb

## 2015-07-04 DIAGNOSIS — E785 Hyperlipidemia, unspecified: Secondary | ICD-10-CM | POA: Diagnosis not present

## 2015-07-04 DIAGNOSIS — I10 Essential (primary) hypertension: Secondary | ICD-10-CM

## 2015-07-04 DIAGNOSIS — I610 Nontraumatic intracerebral hemorrhage in hemisphere, subcortical: Secondary | ICD-10-CM

## 2015-07-04 NOTE — Patient Instructions (Signed)
-   continue ASA 81mg  and pravastatin for stroke prevention - check BP at home - Follow up with your primary care physician for stroke risk factor modification. Recommend maintain blood pressure goal <130/80, diabetes with hemoglobin A1c goal below 6.5% and lipids with LDL cholesterol goal below 70 mg/dL.  - regular exercise to the point your feel comfortable - raise up leg during sleep to help leg swelling. - follow up as needed

## 2015-07-04 NOTE — Progress Notes (Signed)
STROKE NEUROLOGY FOLLOW UP NOTE  NAME: Sherry Lambert DOB: 1941-05-29  REASON FOR VISIT: stroke follow up HISTORY FROM: chart and pt  Today we had the pleasure of seeing Sherry Lambert in follow-up at our Neurology Clinic. Pt was accompanied by son.   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 CT showed acute left thalamic hemorrhage with ventricular extension. Subsequent MRI confirmed the hemorrhage but no evidence of tumor or AVM. MRA showed diffuse athero. She was stabilized in hospital and later discharged to CIR.    She was sent home after rehab with residual right sided paresthesias. However, she was seen again in ER on 10/03/14 due to worsening of her baseline paresthesias on the right side. Exam pertinent for decreased LT on the right side but otherwise unremarkable. Head CT imaging showed resolution of [name] and MRI showed no acute stroke. Her BP was high at 166/97. Suspect symptoms represent exacerbation of prior stroke symptoms.   10/26/14 follow up - the patient has been doing the same. She still complains of right arm and leg numbness and tingling. She is able to walk without any assistance. She is taking only 10mg  lisinopril and her BP was high today at 172/90. She recently visited her PCP and had blood test showed LDL 114 and A1C 6.8.  12/20/14 follow up - the pt has been doing well. She still has intermittent right arm and leg numbness and tingling, especially if she over exerts herself. Her BP at home is around 130-140. She is on lisinopril 20mg  in am and 10mg  in pm. Today in clinic her BP 158/80, and she said she feels nervous. She is not tolerating lipitor which makes her itchy, so it was changed to pravastatin, so far she tolerating well. She complains of right eye and eyelid itchy after stroke. She had cataract surgery of right eye in the past and has not seen ophthalmology yet.    Interval History During the interval time, pt continues to complain of right arm and leg numbness. Tried gabapentin but not effective and had side effects. Put on lyrica prescribed by PCP, but did not take it due to cost. Intermittent right leg swelling, getting better after leg raise at night. BP today 149/73 in clinic, but at home around 120-130s. Still on lisinopril 20/10mg .  On keflex for UTI.  REVIEW OF SYSTEMS: Full 14 system review of systems performed and notable only for those listed below and in HPI above, all others are negative:  Constitutional:   Cardiovascular: leg swelling Ear/Nose/Throat:   Skin:  Eyes:  Eye itching Respiratory:   Gastroitestinal:   Genitourinary:  Hematology/Lymphatic:   Endocrine: cold intolerance Musculoskeletal:   Allergy/Immunology:   Neurological:  Numbness Psychiatric:  Sleep:   The following represents the patient's updated allergies and side effects list: Allergies  Allergen Reactions  . Lipitor [Atorvastatin] Itching    The neurologically relevant items on the patient's problem list were reviewed on today's visit.  Neurologic Examination  A problem focused neurological exam (12 or more points of the single system neurologic examination, vital signs counts as 1 point, cranial nerves count for 8 points) was performed.  Blood pressure 149/73, pulse 77, height 5' (1.524 m), weight 128 lb 6.4 oz (58.242 kg).  General - Well nourished, well developed, in no apparent distress.  Ophthalmologic - Sharp disc margins OU.  Cardiovascular - Regular rate and rhythm  with no murmur.  Mental Status -  Level of arousal and orientation to time, place, and person were intact. Language including expression, naming, repetition, comprehension was assessed and found intact.  Cranial Nerves II - XII - II - Visual field intact OU. III, IV, VI - Extraocular movements intact. V - Facial sensation intact bilaterally. VII - Facial movement intact  bilaterally. VIII - Hearing & vestibular intact bilaterally. X - Palate elevates symmetrically. XI - Chin turning & shoulder shrug intact bilaterally. XII - Tongue protrusion intact.  Motor Strength - The patient's strength was normal in all extremities and pronator drift was absent.  Bulk was normal and fasciculations were absent.   Motor Tone - Muscle tone was assessed at the neck and appendages and was normal.  Reflexes - The patient's reflexes were normal in all extremities and she had no pathological reflexes.  Sensory - Light touch, temperature/pinprick were assessed and were mildly decreased on the right upper and lower extremities.    Coordination - The patient had normal movements in the hands and feet with no ataxia or dysmetria.  Tremor was absent.  Gait and Station - walk with cane, subtle right hemiparetic gait.  Data reviewed: I personally reviewed the images and agree with the radiology interpretations.  Ct Head Wo Contrast  10/02/2014   IMPRESSION: No acute intracranial abnormalities. Chronic atrophy and small vessel ischemic changes.   07/29/14 1. Mildly increased size of the intra-axial hemorrhage located in the left thalamus near the posterior limb of the left internal capsule. Mild regional mass effect is stable. 2. Small volume left lateral intraventricular extension, no ventriculomegaly. 3. No new intracranial abnormality identified.  07/28/14 Acute left posterior limb of the internal capsule hemorrhage measuring 18 mm. Intraventricular extension.  Mr Brain Wo Contrast  10/03/2014    IMPRESSION: No acute intracranial process, specifically no evidence of acute ischemia.  Remote LEFT thalamus hemorrhagic infarct with resolution of edema. A few additional subcentimeter foci of susceptibility artifact may reflect micro hemorrhages from hypertension though was not are nonspecific. These are more conspicuous on today's examination which is likely technical.  Moderate  to severe white matter changes can be seen with chronic small vessel ischemic disease.     07/30/14  1. Acute/subacute left lie make hemorrhage without evidence for an underlying mass. 2. Atrophy and moderate diffuse white matter disease. 3. 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 4. Moderate more distal PCA branch vessel disease bilaterally.  2D ehco - Left ventricle: The cavity size was normal. Wall thickness was increased in a pattern of mild LVH. Systolic function was vigorous. The estimated ejection fraction was in the range of 65% to 70%. Wall motion was normal; there were no regional wall motion abnormalities. Doppler parameters are consistent with abnormal left ventricular relaxation (grade 1 diastolic dysfunction). The E/e&' ratio is between 8-15, suggesting indeterminate LV filling pressure. - Left atrium: The atrium was normal in size. - Right atrium: The atrium was mildly dilated. - Atrial septum: There was increased thickness of the septum, consistent with lipomatous hypertrophy. Impressions: - LVEF 65-70%, mild LVH, normal wall motion, diastolic dysfunction with indeterminate LV filling pressure. Normal LA size, mild RAE, lipomatous hypertrophy of the IAS.  CUS - Findings suggest 1-39% internal carotid artery stenosis bilaterally. The left vertebral artery is patent with antegrade flow, unable to visualize the right vertebral artery.  Component     Latest Ref Rng 10/21/2014 04/21/2015  Cholesterol  0 - 200 mg/dL 196 154  Triglycerides     0.0 - 149.0 mg/dL 92.0 76.0  HDL Cholesterol     >39.00 mg/dL 64.00 68.00  VLDL     0.0 - 40.0 mg/dL 18.4 15.2  LDL (calc)     0 - 99 mg/dL 114 (H) 71  Total CHOL/HDL Ratio      3 2  NonHDL      132.00 85.75  Hemoglobin A1C     4.6 - 6.5 % 6.8 (H) 6.5    Assessment: As you may recall, she is a 74 y.o. Caucasian female with PMH  of HTN, DM, PVD was admitted in 07/2014 for right sided numbness and weakness. CT and MRI confirmed left basal ganglia bleeding extension to ventricles. Her blood pressure on admission was not high. She was sent to CIR and later home. She went back to ER on 10/03/2014 due to worsening right-sided numbness, CT showed resolution of left basal ganglia bleeding and MRI did not show acute stroke. Blood pressure was high at 166/97. Her worsening numbness consistent with recrudescence of old [name]. Currently patient still complained of right-sided numbness tingling, especially after over exertion. Her BP in control and continued on lisinopril 20/10. Also on pravastatin and ASA. Intermittent right leg swelling, getting better after leg raise at night.   Plan:  - continue ASA 81mg  and pravastatin for stroke prevention - check BP at home - Follow up with your primary care physician for stroke risk factor modification. Recommend maintain blood pressure goal <130/80, diabetes with hemoglobin A1c goal below 6.5% and lipids with LDL cholesterol goal below 70 mg/dL.  - regular exercise to the point your feel comfortable - raise up leg during sleep to help leg swelling. - follow up as needed  No orders of the defined types were placed in this encounter.    No orders of the defined types were placed in this encounter.    Patient Instructions  - continue ASA 81mg  and pravastatin for stroke prevention - check BP at home - Follow up with your primary care physician for stroke risk factor modification. Recommend maintain blood pressure goal <130/80, diabetes with hemoglobin A1c goal below 6.5% and lipids with LDL cholesterol goal below 70 mg/dL.  - regular exercise to the point your feel comfortable - raise up leg during sleep to help leg swelling. - follow up as needed    Rosalin Hawking, MD PhD [facility] [address], [place], [place] [address] [phone]

## 2015-07-13 ENCOUNTER — Telehealth: Payer: Self-pay

## 2015-07-13 NOTE — Telephone Encounter (Signed)
Call to the patient to review for AWV; STated she has apt in Feb; needs transportation to get here and doesn't really want to come. Educated that [place] is helpful; reviewed overdue health screens, including prevnar; colonosocpy; will discuss colo guard; declined colonoscopy. Discussed shingles but refused to come in or was not sure she can stay after apt in Feb. Educated to check with insurance regarding her OOP for medical and Part d; as if she has incurred medical expense this year, she may not have a copay

## 2015-08-01 ENCOUNTER — Telehealth: Payer: Self-pay | Admitting: Internal Medicine

## 2015-08-01 ENCOUNTER — Other Ambulatory Visit: Payer: Self-pay | Admitting: Geriatric Medicine

## 2015-08-01 DIAGNOSIS — H01024 Squamous blepharitis left upper eyelid: Secondary | ICD-10-CM | POA: Diagnosis not present

## 2015-08-01 DIAGNOSIS — H01025 Squamous blepharitis left lower eyelid: Secondary | ICD-10-CM | POA: Diagnosis not present

## 2015-08-01 DIAGNOSIS — Z961 Presence of intraocular lens: Secondary | ICD-10-CM | POA: Diagnosis not present

## 2015-08-01 DIAGNOSIS — H02834 Dermatochalasis of left upper eyelid: Secondary | ICD-10-CM | POA: Diagnosis not present

## 2015-08-01 DIAGNOSIS — H01022 Squamous blepharitis right lower eyelid: Secondary | ICD-10-CM | POA: Diagnosis not present

## 2015-08-01 DIAGNOSIS — H02831 Dermatochalasis of right upper eyelid: Secondary | ICD-10-CM | POA: Diagnosis not present

## 2015-08-01 DIAGNOSIS — H01021 Squamous blepharitis right upper eyelid: Secondary | ICD-10-CM | POA: Diagnosis not present

## 2015-08-01 MED ORDER — PRAVASTATIN SODIUM 20 MG PO TABS: 20.0000 mg | ORAL_TABLET | Freq: Every day | ORAL | Status: DC

## 2015-08-01 NOTE — Telephone Encounter (Signed)
Sent to pharmacy 

## 2015-08-01 NOTE — Telephone Encounter (Signed)
Pt called and said that dr that was prescribing her   pravastatin (PRAVACHOL) 20 MG tablet [id number]    Has released her and needs Dr [name] to refill this for her.  Pharmacy is Paediatric nurse on file

## 2015-09-01 ENCOUNTER — Telehealth: Payer: Self-pay | Admitting: Internal Medicine

## 2015-09-01 ENCOUNTER — Telehealth: Payer: Self-pay | Admitting: Gastroenterology

## 2015-09-01 NOTE — Telephone Encounter (Signed)
Patient daughter in law is calling in for patient stating that patient is having a really hard time swallowing. So much that they think she may have to go to ED this weekend. Sherry Lambert also states that patient had a stroke in 07/2014 and thinks this amplifies patients trouble swallowing. Sherry Lambert is requesting to talk to a nurse about this.

## 2015-09-01 NOTE — Telephone Encounter (Signed)
appt was made w/Greg Calone @ 10:30...Johny Chess

## 2015-09-01 NOTE — Telephone Encounter (Signed)
Pt has been scheduled with Amy 09/08/15. She has dysphagia to solids and liquids and is a former Dr Deatra Ina pt. She has an esophageal stricture that was last dilated 01/2013.  Pt's daughter n law was advised to have the pt chew really well avoid thin liquids and take small bites of food avoiding meats such as steak.  The daughter n law will be with the pt until the appt and will bring her to the ED if needed.

## 2015-09-01 NOTE — Telephone Encounter (Signed)
Patient Name: Sherry Lambert DOB: 07-09-1941 Initial Comment caller states her food doesn't want to go down and she has burning in her throat Nurse Assessment Nurse: Marcelline Deist, RN, Kermit Balo Date/Time (Eastern Time): 09/01/2015 8:55:35 AM Confirm and document reason for call. If symptomatic, describe symptoms. ---Caller states her food doesn't want to go down and she has burning in her throat. Had acid reflux & esophagus stretched in past. Had foamy stuff coming up in her mouth. Has the patient traveled out of the country within the last 30 days? ---Not Applicable Does the patient have any new or worsening symptoms? ---Yes Will a triage be completed? ---Yes Related visit to physician within the last 2 weeks? ---No Does the PT have any chronic conditions? (i.e. diabetes, asthma, etc.) ---Yes List chronic conditions. ---stroke, esophagus issues, BP rx, cholesterol rx, on ASA Is this a behavioral health or substance abuse call? ---No Guidelines Guideline Title Affirmed Question Affirmed Notes Swallowing Difficulty [1] Swallowing difficulty AND [2] cause unknown (Exception: difficulty swallowing is a chronic symptom) Final Disposition User See Physician within [place], RN, Kermit Balo Comments Caller feels she needs to see a gastroenterologist to maybe have her esophagus fixed again. Referrals REFERRED TO PCP OFFICE Disagree/Comply: Comply

## 2015-09-02 ENCOUNTER — Ambulatory Visit: Payer: Self-pay | Admitting: Family

## 2015-09-08 ENCOUNTER — Ambulatory Visit (INDEPENDENT_AMBULATORY_CARE_PROVIDER_SITE_OTHER): Payer: Medicare HMO | Admitting: Physician Assistant

## 2015-09-08 ENCOUNTER — Encounter: Payer: Self-pay | Admitting: Physician Assistant

## 2015-09-08 VITALS — BP 162/80 | HR 64 | Ht 59.0 in | Wt 126.4 lb

## 2015-09-08 DIAGNOSIS — R1310 Dysphagia, unspecified: Secondary | ICD-10-CM | POA: Diagnosis not present

## 2015-09-08 DIAGNOSIS — K222 Esophageal obstruction: Secondary | ICD-10-CM | POA: Diagnosis not present

## 2015-09-08 MED ORDER — PANTOPRAZOLE SODIUM 40 MG PO TBEC: DELAYED_RELEASE_TABLET | ORAL | Status: DC

## 2015-09-08 NOTE — Progress Notes (Signed)
Patient ID: Sherry Lambert, female   DOB: 31-Jul-1941, 75 y.o.   MRN: [id number]   Subjective:    Patient ID: Sherry Lambert, female    DOB: 04-Oct-1940, 75 y.o.   MRN: [id number]  HPI  Sherry Lambert  Is a pleasant 75 year old white female known to Dr. Deatra Ina previously with history of esophageal stricture. She comes in today with complaints of recurrent dysphagia. Patient last had EGD in May 2014 and was noted to have a distal stricture which was balloon dilated from 15-18 mm. She had good benefit from the dilation and says she had no difficulty swallowing until symptoms recurred about a month ago. She had an episode last week after eating dinner of acute dysphagia and says she could not get the food to "go down or come up." She says she felt as if the food was stuck in her esophagus for multiple hours and set up for most of the night because she was uncomfortable. She says she eventually went on to bed and when she woke up in the morning symptoms had resolved. Is not had any significant dysphasia since. No difficulty with liquids at all. Generally has no regular heartburn or indigestion. She had been placed on a PPI at one point but says she stopped taking it because she wasn't having heartburn.  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 She is maintained on a baby aspirin.  Review of Systems Pertinent positive and negative review of systems were noted in the above HPI section.  All other review of systems was otherwise negative.  Outpatient Encounter Prescriptions as of 09/08/2015  Medication Sig  . aspirin EC 81 MG tablet Take 1 tablet (81 mg total) by mouth daily.  . Fluticasone-Salmeterol (ADVAIR) 100-50 MCG/DOSE AEPB Inhale 1 puff into the lungs 2 (two) times daily.  Marland Kitchen lisinopril (PRINIVIL,ZESTRIL) 10 MG tablet at bedtime.   Marland Kitchen lisinopril (PRINIVIL,ZESTRIL) 20 MG tablet Take 1 tablet (20 mg total) by mouth daily. (Patient  taking differently: Take 20 mg by mouth every morning. Takes 20mg  po am and 10mg  po pm.)  . pravastatin (PRAVACHOL) 20 MG tablet Take 1 tablet (20 mg total) by mouth daily.  . pantoprazole (PROTONIX) 40 MG tablet Take 1 tab by mouth every morning.  . [DISCONTINUED] cephALEXin (KEFLEX) 500 MG capsule Take 1 capsule (500 mg total) by mouth 2 (two) times daily.   No facility-administered encounter medications on file as of 09/08/2015.   Allergies  Allergen Reactions  . Lipitor [Atorvastatin] Itching   Patient Active Problem List   Diagnosis Date Noted  . Nontraumatic subcortical hemorrhage of left cerebral hemisphere ([place]) 07/04/2015  . Essential hypertension 12/20/2014  . Intracerebral hemorrhage ([place]) 10/26/2014  . Routine general medical examination at a health care facility 10/22/2014  . Abdominal pain, right upper quadrant 10/08/2014  . 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 Peripheral arterial disease ([place]) 01/05/2014  . Osteopenia 11/12/2013  . Dyslipidemia 09/19/2013  . DM type 2 goal A1C below 7.5 11/10/2012  . HTN (hypertension) 11/10/2012  . Other emphysema ([place]) 11/10/2012   Social History   Social History  . Marital Status: Divorced    Spouse Name: N/A  . Number of Children: 1  . Years of Education: N/A   Occupational History  . Retired    Social History Main Topics  . Smoking status: Former Smoker -- 55 years    Types: Cigarettes  . Smokeless tobacco:  Never Used  . Alcohol Use: No  . Drug Use: No  . Sexual Activity: No   Other Topics Concern  . Not on file   Social History Narrative   Regular exercise-no   Caffeine Use-yes    Sherry Lambert's family history includes Diabetes in her father; Hypertension in her father. There is no history of Cancer, Early death, or Stroke.      Objective:    Filed Vitals:   09/08/15 1508  BP: 162/80  Pulse: 64    Physical Exam   Well-developed older white female in no acute  distress, pleasant blood pressure 162/80 pulse 64 height 4 foot 11 weight 126. HEENT; nontraumatic normocephalic EOMI PERRLA sclerae anicteric, Cardiovascular; regular rate and rhythm with S1-S2 no murmur or gallop, Pulmonary; clear bilaterally, Abdomen; soft nontender, nondistended bowel sounds are active no palpable mass or hepatosplenomegaly, Rectal ;exam not done, Ext no clubbing cyanosis or edema skin warm and dry, Neuropsych; mood and affect appropriate       Assessment & Plan:   #1 75 yo female with hx of distal esophageal stricture  requiring prior dilations  presenting with recurrent dysphagia 1 month and 1 episode of transient food impaction one week ago. 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 Silverio Decamp .Procedure discussed in detail with patient and she is agreeable to proceed.  Discussed colon cancer screening and colonoscopy which patient is not interested in. She is agreeable to stool DNA testing with Cologuard   which we will proceed with.  Sherry Lambert S Kierah Goatley PA-C 09/08/2015   Cc: [name], *

## 2015-09-08 NOTE — Patient Instructions (Signed)
You have been scheduled for an endoscopy. Please follow written instructions given to you at your visit today. If you use inhalers (even only as needed), please bring them with you on the day of your procedure. Your physician has requested that you go to www.startemmi.com and enter the access code given to you at your visit today. This web site gives a general overview about your procedure. However, you should still follow specific instructions given to you by our office regarding your preparation for the procedure.  We have sent your demographic and insurance information to [facility]. They should contact you within the next week regarding your Cologuard (colon cancer screening) test. If you have not heard from them within the next week, please call our office at [phone].   

## 2015-09-12 NOTE — Progress Notes (Signed)
Reviewed and agree with documentation and assessment and plan. K. Veena Nandigam , MD   

## 2015-09-19 ENCOUNTER — Telehealth: Payer: Self-pay | Admitting: Physician Assistant

## 2015-09-19 ENCOUNTER — Other Ambulatory Visit: Payer: Self-pay

## 2015-09-19 MED ORDER — OMEPRAZOLE 20 MG PO CPDR: 20.0000 mg | DELAYED_RELEASE_CAPSULE | Freq: Every day | ORAL | Status: DC

## 2015-09-19 NOTE — Telephone Encounter (Signed)
Spoke with Ms. Alessio. She does want to change the medication. Rx to her pharmacy [facility].

## 2015-09-19 NOTE — Telephone Encounter (Signed)
I doubt the nexium is causing her symptoms but she certainly can go back to Omeprazole -that's fine- she is supposed to be on schedule for EGD /dilation with nandigam- be sure that is scheduled for fairly soon

## 2015-09-19 NOTE — Telephone Encounter (Signed)
Patient says her throat is "drawing" a little. This means it hurts when she swallows, making it feel tight. She has a slight cough but does not attribute that to the sore throat. She is uncertain if she should continue the Protonix. She denies any swelling or tingling or her lips or tongue (she is on an ACE). Previously on Omeprazole 20 mg.

## 2015-09-20 ENCOUNTER — Telehealth: Payer: Self-pay | Admitting: *Deleted

## 2015-09-20 NOTE — Telephone Encounter (Signed)
Advised the patient I spoke to [facility] Prior authorization department.  The reason Sherry Lambert sent me a Cover my Meds for the Omeprazole 20 mg script that [facility] LPN sent for Sherry Esterwood PA-C. We sent Protonix 40 mg on 09-08-2015.  We just sent the Omeprazole 20 mg 09-19-2015.  [facility] Pharmacist did an overide so she can get the Omeprazole 20 mg . Her cost ia 1.63.

## 2015-09-22 DIAGNOSIS — J449 Chronic obstructive pulmonary disease, unspecified: Secondary | ICD-10-CM | POA: Diagnosis not present

## 2015-10-03 ENCOUNTER — Telehealth: Payer: Self-pay | Admitting: *Deleted

## 2015-10-03 NOTE — Telephone Encounter (Signed)
Received on 09-27-2015 , fax from [employer].  This patient did not wish to complete the screening. Sent to Medical Records to be scanned.

## 2015-10-10 ENCOUNTER — Ambulatory Visit: Payer: Medicare HMO | Admitting: Internal Medicine

## 2015-10-17 ENCOUNTER — Other Ambulatory Visit: Payer: Self-pay | Admitting: Internal Medicine

## 2015-10-17 DIAGNOSIS — I10 Essential (primary) hypertension: Secondary | ICD-10-CM

## 2015-10-17 MED ORDER — LISINOPRIL 20 MG PO TABS
20.0000 mg | ORAL_TABLET | Freq: Every day | ORAL | Status: DC
Start: 2015-10-17 — End: 2015-11-11

## 2015-10-17 NOTE — Telephone Encounter (Signed)
Pt left msg on triage stating needing to get refill on her Lisinopril was originally rx by her neurologist no longer see. Pt is due for CPX 3/17 sent 30day until appt...Johny Chess

## 2015-10-20 ENCOUNTER — Ambulatory Visit (AMBULATORY_SURGERY_CENTER): Payer: Medicare HMO | Admitting: Gastroenterology

## 2015-10-20 ENCOUNTER — Encounter: Payer: Self-pay | Admitting: Gastroenterology

## 2015-10-20 VITALS — BP 90/67 | HR 80 | Temp 96.9°F | Resp 26 | Ht 59.0 in | Wt 126.0 lb

## 2015-10-20 DIAGNOSIS — K222 Esophageal obstruction: Secondary | ICD-10-CM | POA: Diagnosis not present

## 2015-10-20 DIAGNOSIS — R1310 Dysphagia, unspecified: Secondary | ICD-10-CM | POA: Diagnosis present

## 2015-10-20 DIAGNOSIS — Z8673 Personal history of transient ischemic attack (TIA), and cerebral infarction without residual deficits: Secondary | ICD-10-CM | POA: Diagnosis not present

## 2015-10-20 DIAGNOSIS — I10 Essential (primary) hypertension: Secondary | ICD-10-CM | POA: Diagnosis not present

## 2015-10-20 LAB — GLUCOSE, CAPILLARY
Glucose-Capillary: 105 mg/dL — ABNORMAL HIGH (ref 65–99)
Glucose-Capillary: 100 mg/dL — ABNORMAL HIGH (ref 65–99)

## 2015-10-20 MED ORDER — OMEPRAZOLE 40 MG PO CPDR: 40.0000 mg | DELAYED_RELEASE_CAPSULE | Freq: Two times a day (BID) | ORAL | Status: DC

## 2015-10-20 MED ORDER — SODIUM CHLORIDE 0.9 % IV SOLN: 500.0000 mL | INTRAVENOUS | Status: DC

## 2015-10-20 NOTE — Op Note (Signed)
[facility]  [address] [place], [id number]   ENDOSCOPY PROCEDURE REPORT  PATIENT: Sherry Lambert, Sherry Lambert  MR#: [id number] BIRTHDATE: 1941-04-12 , 41  yrs. old GENDER: female ENDOSCOPIST: Harl Bowie, MD REFERRED BY:  [name], MD PROCEDURE DATE:  10/20/2015 PROCEDURE:  EGD w/ balloon dilation ASA CLASS:     Class III INDICATIONS:  dysphagia. MEDICATIONS: Propofol 150 mg IV TOPICAL ANESTHETIC: none  DESCRIPTION OF PROCEDURE: After the risks benefits and alternatives of the procedure were thoroughly explained, informed consent was obtained.  The LB [id number] endoscope was introduced through the mouth and advanced to the second portion of the duodenum , Without limitations.  The instrument was slowly withdrawn as the mucosa was fully examined.   Distal esophageal stricture  80mm in size at 30cm, could not pass the scope through.  TTS balloon dilation was performed 8-9-10mm with small mucosal tears and minimnal oozing of blood.  Scope subsequently advanced to stomach and duodenum which appeared normal.  Retroflexed views revealed a  5-7cm hiatal hernia.     The scope was then withdrawn from the patient and the procedure completed.  COMPLICATIONS: There were no immediate complications.  ENDOSCOPIC IMPRESSION: Distal esophageal stricture  11mm in size at 30cm, could not pass the scope through.  TTS balloon dilation was performed 8-9-10mm with small mucosal tears and minimnal oozing of blood.  Scope subsequently advanced to stomach and duodenum which appeared normal   RECOMMENDATIONS: 1.  Avoid NSAIDS for two weeks 2.  Increase Omeprazole to 40mg  before breakfast and dinner 3. Anti reflux measures  REPEAT EXAM: 2 months at Hospital  eSigned:  Harl Bowie, MD 10/20/2015 10:48 AM

## 2015-10-20 NOTE — Progress Notes (Signed)
Pt given x2 Levsin for abdominal cramping without vomiting Ambulated to the restroom with gas relief Feeling better to go home

## 2015-10-20 NOTE — Progress Notes (Signed)
Called to room to assist during endoscopic procedure.  Patient ID and intended procedure confirmed with present staff. Received instructions for my participation in the procedure from the performing physician.  

## 2015-10-20 NOTE — Progress Notes (Signed)
Pt had a CVA on July 28, 2015- she is one week shy of the 3 month mark of the CVA.  Starleen Arms CRNA and Dr. Silverio Decamp made aware and ok to proceed

## 2015-10-20 NOTE — Patient Instructions (Signed)
YOU HAD AN ENDOSCOPIC PROCEDURE TODAY AT [facility]:   Refer to the procedure report that was given to you for any specific questions about what was found during the examination.  If the procedure report does not answer your questions, please call your gastroenterologist to clarify.  If you requested that your care partner not be given the details of your procedure findings, then the procedure report has been included in a sealed envelope for you to review at your convenience later.  Please Note:  You might notice some irritation and congestion in your nose or some drainage.  This is from the oxygen used during your procedure.  There is no need for concern and it should clear up in a day or so.  SYMPTOMS TO REPORT IMMEDIATELY:    Following upper endoscopy (EGD)  Vomiting of blood or coffee ground material  New chest pain or pain under the shoulder blades  Painful or persistently difficult swallowing  New shortness of breath  Fever of 100F or higher  Black, tarry-looking stools  For urgent or emergent issues, a gastroenterologist can be reached at any hour by calling [phone].   DIET:Follow Dilation diet  ACTIVITY:  You should plan to take it easy for the rest of today and you should NOT DRIVE or use heavy machinery until tomorrow (because of the sedation medicines used during the test).    FOLLOW UP: Our staff will call the number listed on your records the next business day following your procedure to check on you and address any questions or concerns that you may have regarding the information given to you following your procedure. If we do not reach you, we will leave a message.  However, if you are feeling well and you are not experiencing any problems, there is no need to return our call.  We will assume that you have returned to your regular daily activities without incident.  If any biopsies were taken you will be contacted by phone or by letter within the  next 1-3 weeks.  Please call us at [phone] if you have not heard about the biopsies in 3 weeks.    SIGNATURES/CONFIDENTIALITY: You and/or your care partner have signed paperwork which will be entered into your electronic medical record.  These signatures attest to the fact that that the information above on your After Visit Summary has been reviewed and is understood.  Full responsibility of the confidentiality of this discharge information lies with you and/or your care-partner.  Avoid NSAIDS for 2 weeks Increase Omeprazole to 40 mg tablet before breakfast and dinner Follow dilation diet today, resume regular diet tomorrow Low residual diet until repeat EGD

## 2015-10-20 NOTE — Progress Notes (Signed)
Patient awakening,vss,report to rn 

## 2015-10-21 ENCOUNTER — Telehealth: Payer: Self-pay

## 2015-10-21 ENCOUNTER — Ambulatory Visit: Payer: Medicare HMO | Admitting: Internal Medicine

## 2015-10-21 NOTE — Telephone Encounter (Signed)
  Follow up Call-  Call back number 10/20/2015  Post procedure Call Back phone  # [phone]  Permission to leave phone message Yes     Patient questions:  Do you have a fever, pain , or abdominal swelling? No. Pain Score  0 *  Have you tolerated food without any problems? Yes.    Have you been able to return to your normal activities? Yes.    Do you have any questions about your discharge instructions: Diet   No. Medications  No. Follow up visit  No.  Do you have questions or concerns about your Care? No.  Actions: * If pain score is 4 or above: No action needed, pain <4.

## 2015-10-24 ENCOUNTER — Other Ambulatory Visit: Payer: Self-pay

## 2015-10-24 ENCOUNTER — Telehealth: Payer: Self-pay | Admitting: Neurology

## 2015-10-24 NOTE — Telephone Encounter (Signed)
Patient is calling and states she needs to come back in after her stroke as she is getting worse. Right leg is cramping, arm feels very heavy and hand is swollen. She states she can hardly walk even on a walker.  She would like to be scheduled in please.  Thanks!

## 2015-10-24 NOTE — Telephone Encounter (Signed)
Called and spoke to patient and relayed to  Her she has an apt. With Dr. Erlinda Hong 10/25/2015 to arrive at 11:30. I asked patient did I need to call  Her son she relayed know he would be the one bringing her. I will call son and relay just in case. Spoke to her son and he is aware to patient will be her.

## 2015-10-25 ENCOUNTER — Ambulatory Visit (INDEPENDENT_AMBULATORY_CARE_PROVIDER_SITE_OTHER): Payer: Medicare HMO | Admitting: Neurology

## 2015-10-25 ENCOUNTER — Encounter: Payer: Self-pay | Admitting: Neurology

## 2015-10-25 VITALS — BP 123/68 | HR 80 | Ht 59.0 in | Wt 119.6 lb

## 2015-10-25 DIAGNOSIS — R209 Unspecified disturbances of skin sensation: Principal | ICD-10-CM

## 2015-10-25 DIAGNOSIS — I610 Nontraumatic intracerebral hemorrhage in hemisphere, subcortical: Secondary | ICD-10-CM

## 2015-10-25 DIAGNOSIS — I10 Essential (primary) hypertension: Secondary | ICD-10-CM

## 2015-10-25 DIAGNOSIS — R208 Other disturbances of skin sensation: Secondary | ICD-10-CM

## 2015-10-25 DIAGNOSIS — E785 Hyperlipidemia, unspecified: Secondary | ICD-10-CM

## 2015-10-25 DIAGNOSIS — I69898 Other sequelae of other cerebrovascular disease: Secondary | ICD-10-CM

## 2015-10-25 DIAGNOSIS — M25551 Pain in right hip: Secondary | ICD-10-CM | POA: Diagnosis not present

## 2015-10-25 DIAGNOSIS — I69398 Other sequelae of cerebral infarction: Secondary | ICD-10-CM

## 2015-10-25 MED ORDER — GABAPENTIN 100 MG PO CAPS: 100.0000 mg | ORAL_CAPSULE | Freq: Three times a day (TID) | ORAL | Status: DC

## 2015-10-25 NOTE — Patient Instructions (Addendum)
-   continue ASA 81mg  and pravastatin for stroke prevention - use heat pad for right hip pain - use walker to walk and avoid pain - more rest for a couple of weeks for right hip pain. Follow up with PCP for right hip pain and right leg pain. - check BP at home - relaxation and cope with stress - regular exercise to the point your feel comfortable and no pain - follow up in 3 months

## 2015-10-25 NOTE — Progress Notes (Signed)
STROKE NEUROLOGY FOLLOW UP NOTE  NAME: Sherry Lambert DOB: 12-13-1940  REASON FOR VISIT: stroke follow up HISTORY FROM: chart and pt  Today we had the pleasure of seeing Sherry Lambert in follow-up at our [facility]rology [facility]. Pt was accompanied by son.   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 CT showed acute left thalamic hemorrhage with ventricular extension. Subsequent MRI confirmed the hemorrhage but no evidence of tumor or AVM. MRA showed diffuse athero. She was stabilized in hospital and later discharged to CIR.    She was sent home after rehab with residual right sided paresthesias. However, she was seen again in ER on 10/03/14 due to worsening of her baseline paresthesias on the right side. Exam pertinent for decreased LT on the right side but otherwise unremarkable. Head CT imaging showed resolution of [name] and MRI showed no acute stroke. Her BP was high at 166/97. Suspect symptoms represent exacerbation of prior stroke symptoms.   10/26/14 follow up - the patient has been doing the same. She still complains of right arm and leg numbness and tingling. She is able to walk without any assistance. She is taking only 10mg  lisinopril and her BP was high today at 172/90. She recently visited her PCP and had blood test showed LDL 114 and A1C 6.8.  12/20/14 follow up - the pt has been doing well. She still has intermittent right arm and leg numbness and tingling, especially if she over exerts herself. Her BP at home is around 130-140. She is on lisinopril 20mg  in am and 10mg  in pm. Today in clinic her BP 158/80, and she said she feels nervous. She is not tolerating lipitor which makes her itchy, so it was changed to pravastatin, so far she tolerating well. She complains of right eye and eyelid itchy after stroke. She had cataract surgery of right eye in the past and has not seen ophthalmology yet.    07/04/15 follow up - pt continues to complain of right arm and leg numbness. Tried gabapentin but not effective and had side effects. Put on lyrica prescribed by PCP, but did not take it due to cost. Intermittent right leg swelling, getting better after leg raise at night. BP today 149/73 in clinic, but at home around 120-130s. Still on lisinopril 20/10mg .  On keflex for UTI.  Interval History During the interval time, pt has been doing well until 10 days ago when she had really bad right leg cramping. It hurt so bad and she had to scream for it. It lasted about 5 min and quit but her whole leg was sore and painful since then. Specifically, since then, she had right lateral hip pain, fixed on one spot, tenderness on palpation, along with painful on weight bearing, she has to walk with walker. She also felt right arm heaviness, feeling tightness and right hand swelling feeling. She requested to be seen to rule out stroke.   REVIEW OF SYSTEMS: Full 14 system review of systems performed and notable only for those listed below and in HPI above, all others are negative:  Constitutional:   Cardiovascular:  Ear/Nose/Throat:   Skin:  Eyes:   Respiratory:   Gastroitestinal:   Genitourinary:  Hematology/Lymphatic:   Endocrine: Musculoskeletal:   Allergy/Immunology:   Neurological:   Psychiatric:  Sleep:   The following represents the patient's updated allergies and side effects list: Allergies  Allergen Reactions  .  Lipitor [Atorvastatin] Itching    The neurologically relevant items on the patient's problem list were reviewed on today's visit.  Neurologic Examination  A problem focused neurological exam (12 or more points of the single system neurologic examination, vital signs counts as 1 point, cranial nerves count for 8 points) was performed.  Blood pressure 123/68, pulse 80, height 4\' 11"  (1.499 m), weight 119 lb 9.6 oz (54.25 kg).  General - Well nourished, well developed, in no  apparent distress.  Ophthalmologic - Sharp disc margins OU.  Cardiovascular - Regular rate and rhythm with no murmur.  Mental Status -  Level of arousal and orientation to time, place, and person were intact. Language including expression, naming, repetition, comprehension was assessed and found intact.  Cranial Nerves II - XII - II - Visual field intact OU. III, IV, VI - Extraocular movements intact. V - Facial sensation decreased on the right, about 60% of left. VII - Facial movement intact bilaterally. VIII - Hearing & vestibular intact bilaterally. X - Palate elevates symmetrically. XI - Chin turning & shoulder shrug intact bilaterally. XII - Tongue protrusion intact.  Motor Strength - The patient's strength was normal in all extremities except right LE 4/5 proximal due to lateral hip pain on exertion, distal 5/5.  Bulk was normal and fasciculations were absent.   Motor Tone - Muscle tone was assessed at the neck and appendages and was normal.  Reflexes - The patient's reflexes were normal in all extremities and she had no pathological reflexes.  Sensory - Light touch, temperature/pinprick were assessed and were mildly decreased on the right upper and lower extremities.    Coordination - The patient had normal movements in the hands and feet with no ataxia or dysmetria.  Tremor was absent.  Gait and Station - walk with walker, right antalgic and hemiparetic gait.  Data reviewed: I personally reviewed the images and agree with the radiology interpretations.  Ct Head Wo Contrast  10/02/2014   IMPRESSION: No acute intracranial abnormalities. Chronic atrophy and small vessel ischemic changes.   07/29/14 1. Mildly increased size of the intra-axial hemorrhage located in the left thalamus near the posterior limb of the left internal capsule. Mild regional mass effect is stable. 2. Small volume left lateral intraventricular extension, no ventriculomegaly. 3. No new intracranial  abnormality identified.  07/28/14 Acute left posterior limb of the internal capsule hemorrhage measuring 18 mm. Intraventricular extension.  Mr Brain Wo Contrast  10/03/2014    IMPRESSION: No acute intracranial process, specifically no evidence of acute ischemia.  Remote LEFT thalamus hemorrhagic infarct with resolution of edema. A few additional subcentimeter foci of susceptibility artifact may reflect micro hemorrhages from hypertension though was not are nonspecific. These are more conspicuous on today's examination which is likely technical.  Moderate to severe white matter changes can be seen with chronic small vessel ischemic disease.     07/30/14  1. Acute/subacute left lie make hemorrhage without evidence for an underlying mass. 2. Atrophy and moderate diffuse white matter disease. 3. 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 4. Moderate more distal PCA branch vessel disease bilaterally.  2D ehco - Left ventricle: The cavity size was normal. Wall thickness was increased in a pattern of mild LVH. Systolic function was vigorous. The estimated ejection fraction was in the range of 65% to 70%. Wall motion was normal; there were no regional wall motion abnormalities. Doppler parameters are consistent with abnormal left ventricular relaxation (  grade 1 diastolic dysfunction). The E/e&' ratio is between 8-15, suggesting indeterminate LV filling pressure. - Left atrium: The atrium was normal in size. - Right atrium: The atrium was mildly dilated. - Atrial septum: There was increased thickness of the septum, consistent with lipomatous hypertrophy. Impressions: - LVEF 65-70%, mild LVH, normal wall motion, diastolic dysfunction with indeterminate LV filling pressure. Normal LA size, mild RAE, lipomatous hypertrophy of the IAS.  CUS - Findings suggest 1-39% internal carotid artery  stenosis bilaterally. The left vertebral artery is patent with antegrade flow, unable to visualize the right vertebral artery.  Component     Latest Ref Rng 10/21/2014 04/21/2015  Cholesterol     0 - 200 mg/dL 196 154  Triglycerides     0.0 - 149.0 mg/dL 92.0 76.0  HDL Cholesterol     >39.00 mg/dL 64.00 68.00  VLDL     0.0 - 40.0 mg/dL 18.4 15.2  LDL (calc)     0 - 99 mg/dL 114 (H) 71  Total CHOL/HDL Ratio      3 2  NonHDL      132.00 85.75  Hemoglobin A1C     4.6 - 6.5 % 6.8 (H) 6.5    Assessment: As you may recall, she is a 75 y.o. Caucasian female with PMH of HTN, DM, PVD was admitted in 07/2014 for right sided numbness and weakness. CT and MRI confirmed left basal ganglia bleeding extension to ventricles. Her blood pressure on admission was not high. She was sent to CIR and later home. She went back to ER on 10/03/2014 due to worsening right-sided numbness, CT showed resolution of left basal ganglia bleeding and MRI did not show acute stroke. Blood pressure was high at 166/97. Her worsening numbness consistent with recrudescence of old [name]. Currently patient still complained of right-sided numbness tingling, especially after over exertion. Her BP in control and continued on lisinopril 20/10. Also on pravastatin and ASA. Intermittent right leg swelling, getting better after leg raise at night.    Pt came in today with recent right leg cramping followed with right lateral hip pain on exertion and weight bearing, left arm tightness feeling, not consistent with acute stroke. Most likely, she has right hip arthritis or muscle/tendon strain along with mild recrudescence of some stroke symptoms. Recommend heat pad and follow up with PCP. Use walker and avoid fall.  Plan:  - continue ASA 81mg  and pravastatin for stroke prevention - use heat pad for right lateral hip pain - use walker to walk and avoid pain/fall - more rest for a couple of weeks for right hip pain. Follow up with PCP for  right hip pain and right leg pain. - check BP at home - relaxation and cope with stress - refill gabapentin - regular exercise to the point feeling comfortable and no pain - follow up in 3 months  I spent more than 25 minutes of face to face time with the patient. Greater than 50% of time was spent in counseling and coordination of care.   No orders of the defined types were placed in this encounter.    Meds ordered this encounter  Medications  . PROAIR HFA 108 (90 Base) MCG/ACT inhaler    Sig:   . DISCONTD: omeprazole (PRILOSEC) 20 MG capsule    Sig: Reported on 10/25/2015  . gabapentin (NEURONTIN) 100 MG capsule    Sig: Take 1 capsule (100 mg total) by mouth 3 (three) times daily.    Dispense:  90 capsule  Refill:  2    Patient Instructions  - continue ASA 81mg  and pravastatin for stroke prevention - use heat pad for right hip pain - use walker to walk and avoid pain - more rest for a couple of weeks for right hip pain. Follow up with PCP for right hip pain and right leg pain. - check BP at home - relaxation and cope with stress - regular exercise to the point your feel comfortable and no pain - follow up in 3 months    Rosalin Hawking, MD PhD [facility] [address], [place],  [id number] [phone]

## 2015-11-07 ENCOUNTER — Other Ambulatory Visit: Payer: Self-pay | Admitting: Internal Medicine

## 2015-11-11 ENCOUNTER — Ambulatory Visit (INDEPENDENT_AMBULATORY_CARE_PROVIDER_SITE_OTHER): Payer: Medicare HMO | Admitting: Internal Medicine

## 2015-11-11 ENCOUNTER — Encounter: Payer: Self-pay | Admitting: Internal Medicine

## 2015-11-11 ENCOUNTER — Other Ambulatory Visit (INDEPENDENT_AMBULATORY_CARE_PROVIDER_SITE_OTHER): Payer: Medicare HMO

## 2015-11-11 VITALS — BP 122/64 | HR 86 | Temp 98.3°F | Resp 20 | Ht 60.0 in | Wt 122.8 lb

## 2015-11-11 DIAGNOSIS — E118 Type 2 diabetes mellitus with unspecified complications: Secondary | ICD-10-CM

## 2015-11-11 DIAGNOSIS — I10 Essential (primary) hypertension: Secondary | ICD-10-CM

## 2015-11-11 DIAGNOSIS — Z0000 Encounter for general adult medical examination without abnormal findings: Secondary | ICD-10-CM

## 2015-11-11 DIAGNOSIS — Z23 Encounter for immunization: Secondary | ICD-10-CM

## 2015-11-11 LAB — COMPREHENSIVE METABOLIC PANEL
ALT: 12 U/L (ref 0–35)
AST: 14 U/L (ref 0–37)
Albumin: 4.1 g/dL (ref 3.5–5.2)
Alkaline Phosphatase: 82 U/L (ref 39–117)
BILIRUBIN TOTAL: 0.3 mg/dL (ref 0.2–1.2)
BUN: 16 mg/dL (ref 6–23)
CHLORIDE: 103 meq/L (ref 96–112)
CO2: 30 meq/L (ref 19–32)
Calcium: 9.7 mg/dL (ref 8.4–10.5)
Creatinine, Ser: 0.77 mg/dL (ref 0.40–1.20)
GFR: 78.01 mL/min (ref >60.00)
GLUCOSE: 100 mg/dL — AB (ref 70–99)
POTASSIUM: 3.8 meq/L (ref 3.5–5.1)
SODIUM: 141 meq/L (ref 135–145)
Total Protein: 7.3 g/dL (ref 6.0–8.3)

## 2015-11-11 LAB — HEMOGLOBIN A1C: HEMOGLOBIN A1C: 6.6 % — AB (ref 4.6–6.5)

## 2015-11-11 MED ORDER — LISINOPRIL 20 MG PO TABS: 20.0000 mg | ORAL_TABLET | Freq: Every day | ORAL | Status: DC

## 2015-11-11 MED ORDER — LISINOPRIL 10 MG PO TABS: 10.0000 mg | ORAL_TABLET | Freq: Every day | ORAL | Status: DC

## 2015-11-11 NOTE — Assessment & Plan Note (Signed)
Checking labs, given prevnar 13 today to update immunizations. No colonoscopy and seeing GI. Counseled on sun safety and mole surveillance and she understands. Given 10 year screening recommendations.

## 2015-11-11 NOTE — Progress Notes (Signed)
   Subjective:    Patient ID: Sherry Lambert, female    DOB: [birth year], 75 y.o.   MRN: [id number]  HPI Here for medicare wellness, no new complaints. Please see A/P for status and treatment of chronic medical problems.   Diet: DM if diabetic Physical activity: sedentary Depression/mood screen: negative Hearing: intact to whispered voice Visual acuity: grossly normal, performs annual eye exam  ADLs: capable Fall risk: low Home safety: good Cognitive evaluation: intact to orientation, naming, recall and repetition EOL planning: adv directives discussed, in place  I have personally reviewed and have noted 1. The patient's medical and social history - reviewed today no changes 2. Their use of alcohol, tobacco or illicit drugs 3. Their current medications and supplements 4. The patient's functional ability including ADL's, fall risks, home safety risks and hearing or visual impairment. 5. Diet and physical activities 6. Evidence for depression or mood disorders 7. Care team reviewed and updated (available in snapshot)  Review of Systems  Constitutional: Negative for fever, chills, activity change, appetite change, fatigue and unexpected weight change.  HENT: Negative.   Eyes: Negative.   Respiratory: Negative for cough, chest tightness, shortness of breath and wheezing.   Cardiovascular: Negative for chest pain, palpitations and leg swelling.  Gastrointestinal: Negative for nausea, abdominal pain, diarrhea, constipation and abdominal distention.  Musculoskeletal: Positive for gait problem. Negative for myalgias, back pain and arthralgias.  Skin: Negative.   Neurological: Positive for numbness. Negative for dizziness, seizures, speech difficulty, weakness, light-headedness and headaches.  Psychiatric/Behavioral: Negative.       Objective:   Physical Exam  Constitutional: She is oriented to person, place, and time. She appears well-developed and well-nourished.  HENT:  Head:  Normocephalic and atraumatic.  Eyes: EOM are normal.  Neck: Normal range of motion.  Cardiovascular: Normal rate and regular rhythm.   Pulmonary/Chest: Breath sounds normal. No respiratory distress. She has no wheezes. She has no rales.  Abdominal: Soft. Bowel sounds are normal. She exhibits no distension. There is no tenderness. There is no rebound.  Musculoskeletal: She exhibits no edema.  Neurological: She is alert and oriented to person, place, and time. Coordination normal.  Skin: Skin is warm and dry.  See foot exam   Filed Vitals:   11/11/15 1316  BP: 122/64  Pulse: 86  Temp: 98.3 F (36.8 C)  TempSrc: Oral  Resp: 20  Height: 5' (1.524 m)  Weight: 122 lb 12.8 oz (55.702 kg)  SpO2: 92%      Assessment & Plan:  Prevnar 13 given at visit.

## 2015-11-11 NOTE — Addendum Note (Signed)
Addended by: Resa Miner R on: 11/11/2015 04:46 PM   Modules accepted: Orders

## 2015-11-11 NOTE — Assessment & Plan Note (Signed)
Controlled with diet and exercise. Taking lisinopril and statin. Foot exam done today, checking HgA1c and CMP and adjust as needed.

## 2015-11-11 NOTE — Patient Instructions (Signed)
We are checking the labs today and have sent in the refills.   Come back in about 6 months to check on the sugars and blood pressure.   Health Maintenance, Female Adopting a healthy lifestyle and getting preventive care can go a long way to promote health and wellness. Talk with your health care provider about what schedule of regular examinations is right for you. This is a good chance for you to check in with your provider about disease prevention and staying healthy. In between checkups, there are plenty of things you can do on your own. Experts have done a lot of research about which lifestyle changes and preventive measures are most likely to keep you healthy. Ask your health care provider for more information. WEIGHT AND DIET  Eat a healthy diet  Be sure to include plenty of vegetables, fruits, low-fat dairy products, and lean protein.  Do not eat a lot of foods high in solid fats, added sugars, or salt.  Get regular exercise. This is one of the most important things you can do for your health.  Most adults should exercise for at least 150 minutes each week. The exercise should increase your heart rate and make you sweat (moderate-intensity exercise).  Most adults should also do strengthening exercises at least twice a week. This is in addition to the moderate-intensity exercise.  Maintain a healthy weight  Body mass index (BMI) is a measurement that can be used to identify possible weight problems. It estimates body fat based on height and weight. Your health care provider can help determine your BMI and help you achieve or maintain a healthy weight.  For females 13 years of age and older:   A BMI below 18.5 is considered underweight.  A BMI of 18.5 to 24.9 is normal.  A BMI of 25 to 29.9 is considered overweight.  A BMI of 30 and above is considered obese.  Watch levels of cholesterol and blood lipids  You should start having your blood tested for lipids and cholesterol  at 74 years of age, then have this test every 5 years.  You may need to have your cholesterol levels checked more often if:  Your lipid or cholesterol levels are high.  You are older than 74 years of age.  You are at high risk for heart disease.  CANCER SCREENING   Lung Cancer  Lung cancer screening is recommended for adults 81-65 years old who are at high risk for lung cancer because of a history of smoking.  A yearly low-dose CT scan of the lungs is recommended for people who:  Currently smoke.  Have quit within the past 15 years.  Have at least a 30-pack-year history of smoking. A pack year is smoking an average of one pack of cigarettes a day for 1 year.  Yearly screening should continue until it has been 15 years since you quit.  Yearly screening should stop if you develop a health problem that would prevent you from having lung cancer treatment.  Breast Cancer  Practice breast self-awareness. This means understanding how your breasts normally appear and feel.  It also means doing regular breast self-exams. Let your health care provider know about any changes, no matter how small.  If you are in your 20s or 30s, you should have a clinical breast exam (CBE) by a health care provider every 1-3 years as part of a regular health exam.  If you are 66 or older, have a CBE every year. Also  consider having a breast X-ray (mammogram) every year.  If you have a family history of breast cancer, talk to your health care provider about genetic screening.  If you are at high risk for breast cancer, talk to your health care provider about having an MRI and a mammogram every year.  Breast cancer gene (BRCA) assessment is recommended for women who have family members with BRCA-related cancers. BRCA-related cancers include:  Breast.  Ovarian.  Tubal.  Peritoneal cancers.  Results of the assessment will determine the need for genetic counseling and BRCA1 and BRCA2  testing. Cervical Cancer Your health care provider may recommend that you be screened regularly for cancer of the pelvic organs (ovaries, uterus, and vagina). This screening involves a pelvic examination, including checking for microscopic changes to the surface of your cervix (Pap test). You may be encouraged to have this screening done every 3 years, beginning at age 31.  For women ages 67-65, health care providers may recommend pelvic exams and Pap testing every 3 years, or they may recommend the Pap and pelvic exam, combined with testing for human papilloma virus (HPV), every 5 years. Some types of HPV increase your risk of cervical cancer. Testing for HPV may also be done on women of any age with unclear Pap test results.  Other health care providers may not recommend any screening for nonpregnant women who are considered low risk for pelvic cancer and who do not have symptoms. Ask your health care provider if a screening pelvic exam is right for you.  If you have had past treatment for cervical cancer or a condition that could lead to cancer, you need Pap tests and screening for cancer for at least 20 years after your treatment. If Pap tests have been discontinued, your risk factors (such as having a new sexual partner) need to be reassessed to determine if screening should resume. Some women have medical problems that increase the chance of getting cervical cancer. In these cases, your health care provider may recommend more frequent screening and Pap tests. Colorectal Cancer  This type of cancer can be detected and often prevented.  Routine colorectal cancer screening usually begins at 74 years of age and continues through 74 years of age.  Your health care provider may recommend screening at an earlier age if you have risk factors for colon cancer.  Your health care provider may also recommend using home test kits to check for hidden blood in the stool.  A small camera at the end of a  tube can be used to examine your colon directly (sigmoidoscopy or colonoscopy). This is done to check for the earliest forms of colorectal cancer.  Routine screening usually begins at age 25.  Direct examination of the colon should be repeated every 5-10 years through 74 years of age. However, you may need to be screened more often if early forms of precancerous polyps or small growths are found. Skin Cancer  Check your skin from head to toe regularly.  Tell your health care provider about any new moles or changes in moles, especially if there is a change in a mole's shape or color.  Also tell your health care provider if you have a mole that is larger than the size of a pencil eraser.  Always use sunscreen. Apply sunscreen liberally and repeatedly throughout the day.  Protect yourself by wearing long sleeves, pants, a wide-brimmed hat, and sunglasses whenever you are outside. HEART DISEASE, DIABETES, AND HIGH BLOOD PRESSURE   High  blood pressure causes heart disease and increases the risk of stroke. High blood pressure is more likely to develop in:  People who have blood pressure in the high end of the normal range (130-139/85-89 mm Hg).  People who are overweight or obese.  People who are African American.  If you are 30-90 years of age, have your blood pressure checked every 3-5 years. If you are 83 years of age or older, have your blood pressure checked every year. You should have your blood pressure measured twice--once when you are at a hospital or clinic, and once when you are not at a hospital or clinic. Record the average of the two measurements. To check your blood pressure when you are not at a hospital or clinic, you can use:  An automated blood pressure machine at a pharmacy.  A home blood pressure monitor.  If you are between 66 years and 43 years old, ask your health care provider if you should take aspirin to prevent strokes.  Have regular diabetes screenings. This  involves taking a blood sample to check your fasting blood sugar level.  If you are at a normal weight and have a low risk for diabetes, have this test once every three years after 74 years of age.  If you are overweight and have a high risk for diabetes, consider being tested at a younger age or more often. PREVENTING INFECTION  Hepatitis B  If you have a higher risk for hepatitis B, you should be screened for this virus. You are considered at high risk for hepatitis B if:  You were born in a country where hepatitis B is common. Ask your health care provider which countries are considered high risk.  Your parents were born in a high-risk country, and you have not been immunized against hepatitis B (hepatitis B vaccine).  You have HIV or AIDS.  You use needles to inject street drugs.  You live with someone who has hepatitis B.  You have had sex with someone who has hepatitis B.  You get hemodialysis treatment.  You take certain medicines for conditions, including cancer, organ transplantation, and autoimmune conditions. Hepatitis C  Blood testing is recommended for:  Everyone born from [birth year] through 1964.  Anyone with known risk factors for hepatitis C. Sexually transmitted infections (STIs)  You should be screened for sexually transmitted infections (STIs) including gonorrhea and chlamydia if:  You are sexually active and are younger than 74 years of age.  You are older than 74 years of age and your health care provider tells you that you are at risk for this type of infection.  Your sexual activity has changed since you were last screened and you are at an increased risk for chlamydia or gonorrhea. Ask your health care provider if you are at risk.  If you do not have HIV, but are at risk, it may be recommended that you take a prescription medicine daily to prevent HIV infection. This is called pre-exposure prophylaxis (PrEP). You are considered at risk if:  You are  sexually active and do not regularly use condoms or know the HIV status of your partner(s).  You take drugs by injection.  You are sexually active with a partner who has HIV. Talk with your health care provider about whether you are at high risk of being infected with HIV. If you choose to begin PrEP, you should first be tested for HIV. You should then be tested every 3 months for as long  as you are taking PrEP.  PREGNANCY   If you are premenopausal and you may become pregnant, ask your health care provider about preconception counseling.  If you may become pregnant, take 400 to 800 micrograms (mcg) of folic acid every day.  If you want to prevent pregnancy, talk to your health care provider about birth control (contraception). OSTEOPOROSIS AND MENOPAUSE   Osteoporosis is a disease in which the bones lose minerals and strength with aging. This can result in serious bone fractures. Your risk for osteoporosis can be identified using a bone density scan.  If you are 65 years of age or older, or if you are at risk for osteoporosis and fractures, ask your health care provider if you should be screened.  Ask your health care provider whether you should take a calcium or vitamin D supplement to lower your risk for osteoporosis.  Menopause may have certain physical symptoms and risks.  Hormone replacement therapy may reduce some of these symptoms and risks. Talk to your health care provider about whether hormone replacement therapy is right for you.  HOME CARE INSTRUCTIONS   Schedule regular health, dental, and eye exams.  Stay current with your immunizations.   Do not use any tobacco products including cigarettes, chewing tobacco, or electronic cigarettes.  If you are pregnant, do not drink alcohol.  If you are breastfeeding, limit how much and how often you drink alcohol.  Limit alcohol intake to no more than 1 drink per day for nonpregnant women. One drink equals 12 ounces of beer, 5  ounces of wine, or 1 ounces of hard liquor.  Do not use street drugs.  Do not share needles.  Ask your health care provider for help if you need support or information about quitting drugs.  Tell your health care provider if you often feel depressed.  Tell your health care provider if you have ever been abused or do not feel safe at home.   This information is not intended to replace advice given to you by your health care provider. Make sure you discuss any questions you have with your health care provider.   Document Released: 03/05/2011 Document Revised: 09/10/2014 Document Reviewed: 07/22/2013 Elsevier Interactive Patient Education 2016 [employer]  

## 2015-11-11 NOTE — Assessment & Plan Note (Signed)
BP at goal with lisinopril 30 mg daily. Checking CMP and adjust as needed.

## 2015-11-11 NOTE — Progress Notes (Signed)
Pre visit review using our clinic review tool, if applicable. No additional management support is needed unless otherwise documented below in the visit note. 

## 2015-11-17 ENCOUNTER — Telehealth: Payer: Self-pay | Admitting: Internal Medicine

## 2015-11-17 NOTE — Telephone Encounter (Signed)
Pt called requesting lab results. I let her know what Dr. [name] said and she is curious as to why her cholesterol wasn't checked. Please give her a call back

## 2015-11-18 NOTE — Telephone Encounter (Signed)
Left message on voice mail notifying patient of lab results.

## 2015-11-26 ENCOUNTER — Encounter (HOSPITAL_COMMUNITY): Payer: Self-pay

## 2015-11-26 ENCOUNTER — Emergency Department (HOSPITAL_COMMUNITY)
Admission: EM | Admit: 2015-11-26 | Discharge: 2015-11-26 | Disposition: A | Discharge status: Home / Self Care | Payer: Medicare HMO | Attending: Emergency Medicine | Admitting: Emergency Medicine

## 2015-11-26 ENCOUNTER — Emergency Department (HOSPITAL_COMMUNITY): Payer: Medicare HMO

## 2015-11-26 DIAGNOSIS — R0602 Shortness of breath: Secondary | ICD-10-CM | POA: Insufficient documentation

## 2015-11-26 DIAGNOSIS — J439 Emphysema, unspecified: Secondary | ICD-10-CM | POA: Diagnosis not present

## 2015-11-26 DIAGNOSIS — E785 Hyperlipidemia, unspecified: Secondary | ICD-10-CM | POA: Insufficient documentation

## 2015-11-26 DIAGNOSIS — Y9301 Activity, walking, marching and hiking: Secondary | ICD-10-CM | POA: Diagnosis not present

## 2015-11-26 DIAGNOSIS — E119 Type 2 diabetes mellitus without complications: Secondary | ICD-10-CM | POA: Insufficient documentation

## 2015-11-26 DIAGNOSIS — W231XXA Caught, crushed, jammed, or pinched between stationary objects, initial encounter: Secondary | ICD-10-CM | POA: Diagnosis not present

## 2015-11-26 DIAGNOSIS — R069 Unspecified abnormalities of breathing: Secondary | ICD-10-CM | POA: Diagnosis not present

## 2015-11-26 DIAGNOSIS — N189 Chronic kidney disease, unspecified: Secondary | ICD-10-CM | POA: Diagnosis not present

## 2015-11-26 DIAGNOSIS — S29001A Unspecified injury of muscle and tendon of front wall of thorax, initial encounter: Secondary | ICD-10-CM | POA: Diagnosis present

## 2015-11-26 DIAGNOSIS — Z8673 Personal history of transient ischemic attack (TIA), and cerebral infarction without residual deficits: Secondary | ICD-10-CM | POA: Diagnosis not present

## 2015-11-26 DIAGNOSIS — I129 Hypertensive chronic kidney disease with stage 1 through stage 4 chronic kidney disease, or unspecified chronic kidney disease: Secondary | ICD-10-CM | POA: Insufficient documentation

## 2015-11-26 DIAGNOSIS — R0600 Dyspnea, unspecified: Secondary | ICD-10-CM | POA: Diagnosis not present

## 2015-11-26 DIAGNOSIS — Z87442 Personal history of urinary calculi: Secondary | ICD-10-CM | POA: Diagnosis not present

## 2015-11-26 DIAGNOSIS — Y998 Other external cause status: Secondary | ICD-10-CM | POA: Insufficient documentation

## 2015-11-26 DIAGNOSIS — Z7982 Long term (current) use of aspirin: Secondary | ICD-10-CM | POA: Diagnosis not present

## 2015-11-26 DIAGNOSIS — Y92009 Unspecified place in unspecified non-institutional (private) residence as the place of occurrence of the external cause: Secondary | ICD-10-CM | POA: Diagnosis not present

## 2015-11-26 DIAGNOSIS — Z7951 Long term (current) use of inhaled steroids: Secondary | ICD-10-CM | POA: Diagnosis not present

## 2015-11-26 DIAGNOSIS — K219 Gastro-esophageal reflux disease without esophagitis: Secondary | ICD-10-CM | POA: Insufficient documentation

## 2015-11-26 DIAGNOSIS — Z87891 Personal history of nicotine dependence: Secondary | ICD-10-CM | POA: Insufficient documentation

## 2015-11-26 DIAGNOSIS — S29011A Strain of muscle and tendon of front wall of thorax, initial encounter: Secondary | ICD-10-CM | POA: Insufficient documentation

## 2015-11-26 DIAGNOSIS — Z79899 Other long term (current) drug therapy: Secondary | ICD-10-CM | POA: Diagnosis not present

## 2015-11-26 DIAGNOSIS — R5383 Other fatigue: Secondary | ICD-10-CM | POA: Diagnosis not present

## 2015-11-26 LAB — CBC WITH DIFFERENTIAL/PLATELET
Basophils Absolute: 0 10*3/uL (ref 0.0–0.1)
Basophils Relative: 0 %
EOS ABS: 0.1 10*3/uL (ref 0.0–0.7)
EOS PCT: 1 %
HCT: 42.1 % (ref 36.0–46.0)
Hemoglobin: 13.4 g/dL (ref 12.0–15.0)
LYMPHS ABS: 2.3 10*3/uL (ref 0.7–4.0)
Lymphocytes Relative: 16 %
MCH: 29.3 pg (ref 26.0–34.0)
MCHC: 31.8 g/dL (ref 30.0–36.0)
MCV: 91.9 fL (ref 78.0–100.0)
MONOS PCT: 6 %
Monocytes Absolute: 0.9 10*3/uL (ref 0.1–1.0)
Neutro Abs: 10.8 10*3/uL — ABNORMAL HIGH (ref 1.7–7.7)
Neutrophils Relative %: 77 %
PLATELETS: 261 10*3/uL (ref 150–400)
RBC: 4.58 MIL/uL (ref 3.87–5.11)
RDW: 13.6 % (ref 11.5–15.5)
WBC: 14 10*3/uL — ABNORMAL HIGH (ref 4.0–10.5)

## 2015-11-26 LAB — I-STAT CHEM 8, ED
BUN: 15 mg/dL (ref 6–20)
CALCIUM ION: 1.16 mmol/L (ref 1.13–1.30)
Chloride: 102 mmol/L (ref 101–111)
Creatinine, Ser: 0.7 mg/dL (ref 0.44–1.00)
Glucose, Bld: 112 mg/dL — ABNORMAL HIGH (ref 65–99)
HEMATOCRIT: 44 % (ref 36.0–46.0)
HEMOGLOBIN: 15 g/dL (ref 12.0–15.0)
Potassium: 3.2 mmol/L — ABNORMAL LOW (ref 3.5–5.1)
SODIUM: 142 mmol/L (ref 135–145)
TCO2: 28 mmol/L (ref 0–100)

## 2015-11-26 NOTE — ED Notes (Signed)
Pt comes from home via [facility], c/o SOB and unable to take a deep breath since around 5pm, hx of COPD, pt arm was caught on door handle today and pulled muscle backwards, since had been unable to deep breath.

## 2015-11-26 NOTE — ED Provider Notes (Signed)
CSN: [id number]     Arrival date & time 11/26/15  2109 History   First MD Initiated Contact with Patient 11/26/15 2115     Chief Complaint  Patient presents with  . [name]    Patient is a 75 y.o. female presenting with shortness of breath. The history is provided by the patient.  [name] Associated symptoms: chest pain   Associated symptoms: no abdominal pain and no neck pain    patient presents with shortness of breath. His been a little short of breath last couple days but worse today. States she was walking in her right arm caught on a door and she began to have lower chest pain with it. She states after that she more difficult breathing. No fevers or chills. No Cough. No nausea vomiting or diarrhea. The chest pain is dull. States she has had some numbness and tingling in her left arm and leg. States she's had this on and off since her previous stroke. States she is told by her neurologist that this will come and go. No dysuria. States she has felt a little weak all over.  Past Medical History  Diagnosis Date  . Emphysema of lung ([place])   . GERD (gastroesophageal reflux disease)   . Hypertension   . [name]   . [name]   . Peripheral arterial disease ([place])   . Diabetes mellitus without complication ([place])     no meds taken  . Hyperlipidemia   . Chronic kidney disease     kidney stone  . Stroke [facility])     November 2016- Thanksgiving night   Past Surgical History  Procedure Laterality Date  . Tonsillectomy    . Abdominal hysterectomy    . Colonoscopy    . Esophagogastroduodenoscopy N/A 12/23/2012    Procedure: ESOPHAGOGASTRODUODENOSCOPY (EGD);  Surgeon: Inda Castle, MD;  Location: [facility];  Service: Endoscopy;  Laterality: N/A;  . Balloon dilation N/A 12/23/2012    Procedure: BALLOON DILATION;  Surgeon: Inda Castle, MD;  Location: [facility];  Service: Endoscopy;  Laterality: N/A;  . Esophagogastroduodenoscopy N/A 01/15/2013     Procedure: ESOPHAGOGASTRODUODENOSCOPY (EGD);  Surgeon: Inda Castle, MD;  Location: [facility];  Service: Endoscopy;  Laterality: N/A;  . Balloon dilation N/A 01/15/2013    Procedure: BALLOON DILATION;  Surgeon: Inda Castle, MD;  Location: [facility] ENDOS[facility];  Service: Endoscopy;  Laterality: N/A;  . Cataract      both eyes   Family History  Problem Relation Age of Onset  . Hypertension Father   . Diabetes Father   . Cancer Neg Hx   . Early death Neg Hx   . Stroke Neg Hx   . Esophageal cancer Neg Hx   . Rectal cancer Neg Hx   . Stomach cancer Neg Hx   . Colon cancer Sister    Social History  Substance Use Topics  . Smoking status: Former Smoker -- 72 years    Types: Cigarettes    Quit date: 10/19/2004  . Smokeless tobacco: Never Used  . Alcohol Use: No   OB History    No data available     Review of Systems  Constitutional: Positive for fatigue. Negative for appetite change.  Respiratory: Positive for shortness of breath.   Cardiovascular: Positive for chest pain. Negative for leg swelling.  Gastrointestinal: Negative for abdominal pain.  Genitourinary: Negative for dysuria.  Musculoskeletal: Negative for neck pain.  Skin: Negative for wound.  Neurological:  Positive for numbness. Negative for tremors.  Psychiatric/Behavioral: Negative for confusion.      Allergies  Lipitor  Home Medications   Prior to Admission medications   Medication Sig Start Date End Date Taking? Authorizing Provider  aspirin EC 81 MG tablet Take 1 tablet (81 mg total) by mouth daily. 12/20/14  Yes Rosalin Hawking, MD  Dextromethorphan-Guaifenesin (CORICIDIN HBP CONGESTION/COUGH) 10-200 MG CAPS Take 1 capsule by mouth daily as needed (for congestion).   Yes Historical Provider, MD  Fluticasone-Salmeterol (ADVAIR DISKUS) 100-50 MCG/DOSE AEPB Inhale 1 puff into the lungs 2 (two) times daily. Must keep March appt for future refills 10/17/15  Yes [name], MD  gabapentin (NEURONTIN)  100 MG capsule Take 1 capsule (100 mg total) by mouth 3 (three) times daily. 10/25/15  Yes Rosalin Hawking, MD  lisinopril (PRINIVIL,ZESTRIL) 10 MG tablet Take 1 tablet (10 mg total) by mouth daily. Patient taking differently: Take 10 mg by mouth every evening.  11/11/15  Yes [name], MD  lisinopril (PRINIVIL,ZESTRIL) 20 MG tablet Take 1 tablet (20 mg total) by mouth daily. Must keep march appt for future refills Patient taking differently: Take 20 mg by mouth every morning. Must keep march appt for future refills 11/11/15  Yes [name], MD  omeprazole (PRILOSEC) 40 MG capsule Take 1 capsule (40 mg total) by mouth 2 (two) times daily before a meal. Take before breakfast and before dinner 10/20/15  Yes Kavitha Nandigam V, MD  pravastatin (PRAVACHOL) 20 MG tablet Take 1 tablet (20 mg total) by mouth daily. Patient taking differently: Take 20 mg by mouth every evening.  08/01/15  Yes [name], MD  PROAIR HFA 108 [phone] Base) MCG/ACT inhaler Inhale 2 puffs into the lungs every 6 (six) hours as needed for shortness of breath.  09/22/15  Yes Historical Provider, MD   BP 136/100 mmHg  Pulse 84  Temp(Src) 98.3 F (36.8 C) (Oral)  Resp 13  Ht 5\' 4"  (1.626 m)  Wt 125 lb (56.7 kg)  BMI 21.45 kg/m2  SpO2 98% Physical Exam  Constitutional: She is oriented to person, place, and time. She appears well-developed and well-nourished.  HENT:  Head: Normocephalic and atraumatic.  Eyes: EOM are normal. Pupils are equal, round, and reactive to light.  Neck: Normal range of motion.  Cardiovascular: Normal rate, regular rhythm and normal heart sounds.   No murmur heard. Pulmonary/Chest: Effort normal. No respiratory distress. She has no wheezes. She has no rales. She exhibits tenderness.  Mild anterior lower right chest wall tenderness. No crepitance. No rash.  Abdominal: Soft. Bowel sounds are normal. She exhibits no distension. There is no tenderness. There is no rebound and no  guarding.  Musculoskeletal: Normal range of motion.  Neurological: She is alert and oriented to person, place, and time. No cranial nerve deficit.  Good grip strength in bilateral hands.  Skin: Skin is warm and dry.  Psychiatric: She has a normal mood and affect. Her speech is normal.  Nursing note and vitals reviewed.   ED Course  Procedures (including critical care time) Labs Review Labs Reviewed  CBC WITH DIFFERENTIAL/PLATELET - Abnormal; Notable for the following:    WBC 14.0 (*)    Neutro Abs 10.8 (*)    All other components within normal limits  I-STAT CHEM 8, ED - Abnormal; Notable for the following:    Potassium 3.2 (*)    Glucose, Bld 112 (*)    All other components within normal limits  Imaging Review Dg Chest 2 View  11/26/2015  CLINICAL DATA:  [name]. Numbness and tingling in the arms. EXAM: CHEST  2 VIEW COMPARISON:  07/28/2014 FINDINGS: Normal heart size and pulmonary vascularity. Interstitial changes in the lung bases likely representing fibrosis or chronic bronchitic changes. No focal airspace disease or consolidation in the lungs. No blunting of costophrenic angles. No pneumothorax. Old right rib fractures. Esophageal hiatal hernia behind the heart. Degenerative changes in the spine. IMPRESSION: Chronic interstitial changes in the lungs. No evidence of active pulmonary disease. Electronically Signed   By: Lucienne Capers M.D.   On: 11/26/2015 21:57   I have personally reviewed and evaluated these images and lab results as part of my medical decision-making.   EKG Interpretation   Date/Time:  Saturday November 26 2015 21:24:54 EDT Ventricular Rate:  78 PR Interval:  147 QRS Duration: 81 QT Interval:  374 QTC Calculation: 426 R Axis:   37 Text Interpretation:  Sinus rhythm Confirmed by Alvino Chapel  MD, Ovid Curd  [phone]) on 11/26/2015 11:03:27 PM      MDM   Final diagnoses:  Dyspnea  Chest wall muscle strain, initial encounter    Patient with  shortness of breath. Began after she stretched her arm back. X-ray shows some chronic changes. Not hypoxic here. Feels better than she had. Lab work reassuring with does have an elevated white count. No dysuria. No flank pain. The tingling on the right side is chronic. Will discharge home.    Davonna Belling, MD 11/26/15 [phone]

## 2015-11-26 NOTE — Discharge Instructions (Signed)

## 2015-11-28 ENCOUNTER — Telehealth: Payer: Self-pay | Admitting: Internal Medicine

## 2015-11-28 DIAGNOSIS — J449 Chronic obstructive pulmonary disease, unspecified: Secondary | ICD-10-CM

## 2015-11-28 NOTE — Telephone Encounter (Signed)
pts daughter in law called regarding patient couldn't catch her breath this weekend that her inhaler didn't work and she went to ER. She states she has COPD and she is requesting a referral to pulmonary

## 2015-11-28 NOTE — Telephone Encounter (Signed)
Referral placed.

## 2015-11-30 ENCOUNTER — Ambulatory Visit (INDEPENDENT_AMBULATORY_CARE_PROVIDER_SITE_OTHER): Payer: Medicare HMO | Admitting: Pulmonary Disease

## 2015-11-30 ENCOUNTER — Encounter: Payer: Self-pay | Admitting: Pulmonary Disease

## 2015-11-30 VITALS — BP 138/78 | HR 88 | Ht 64.0 in | Wt 123.6 lb

## 2015-11-30 DIAGNOSIS — R0609 Other forms of dyspnea: Secondary | ICD-10-CM | POA: Diagnosis not present

## 2015-11-30 DIAGNOSIS — J449 Chronic obstructive pulmonary disease, unspecified: Secondary | ICD-10-CM

## 2015-11-30 DIAGNOSIS — J438 Other emphysema: Secondary | ICD-10-CM

## 2015-11-30 DIAGNOSIS — R0600 Dyspnea, unspecified: Secondary | ICD-10-CM

## 2015-11-30 MED ORDER — FLUTICASONE FUROATE-VILANTEROL 200-25 MCG/INH IN AEPB: 1.0000 | INHALATION_SPRAY | Freq: Every day | RESPIRATORY_TRACT | Status: DC

## 2015-11-30 NOTE — Patient Instructions (Addendum)
1. We will schedule you for a breathing test. 2. We will increase her Breo 200, one puff once a day. Rinse her mouth each time using it. 3. Give [place] a call after 1 week. Let us know if you want [place] to switch you to a nebulizer or add Spiriva.  Return to clinic in 2 mos.

## 2015-11-30 NOTE — Progress Notes (Addendum)
Subjective:    Patient ID: Sherry Lambert, female    DOB: 10-24-1940, 75 y.o.   MRN: [id number]  HPI   This is the case of Sherry Lambert, 75 y.o. Female, who was referred by Dr. [name]  in consultation regarding SOB.   As you very well know, patient has a 72 PY smoking history, quit when she was in her 19s.  Pt was dxed with copd couple yrs ago. She is on advair 100/50 1P BID. Using it x 3 yrs.  She was dxed with PNA in 09/2015. Went to ER >> was dxed with PNA. Took abx x 10days.  She slowly improved >> not at baseline.  Has proiair  1x/week.  Lives by herself, independent in [place].  Was at ER weekend >> CXR was OK.   Over all, is still more SOB than baseline.          Review of Systems  Constitutional: Negative.  Negative for fever and unexpected weight change.  HENT: Positive for congestion and postnasal drip. Negative for dental problem, ear pain, nosebleeds, rhinorrhea, sinus pressure, sneezing, sore throat and trouble swallowing.   Eyes: Negative.  Negative for redness and itching.  Respiratory: Positive for chest tightness and shortness of breath. Negative for cough and wheezing.   Cardiovascular: Positive for leg swelling. Negative for palpitations.  Gastrointestinal: Negative.  Negative for nausea and vomiting.  Endocrine: Negative.   Genitourinary: Negative.  Negative for dysuria.  Musculoskeletal: Positive for arthralgias. Negative for joint swelling.  Skin: Negative for rash.  Allergic/Immunologic: Positive for environmental allergies.  Neurological: Negative.  Negative for headaches.  Hematological: Bruises/bleeds easily.  Psychiatric/Behavioral: Negative.  Negative for dysphoric mood. The patient is not nervous/anxious.    Past Medical History  Diagnosis Date  . Emphysema of lung ([place])   . GERD (gastroesophageal reflux disease)   . Hypertension   . [name]   . [name]   . Peripheral arterial disease ([place])   . Diabetes  mellitus without complication ([place])     no meds taken  . Hyperlipidemia   . Chronic kidney disease     kidney stone  . Stroke [facility])     November 2016- Thanksgiving night   (-) CA, DVT.  Has PVD.   Family History  Problem Relation Age of Onset  . Hypertension Father   . Diabetes Father   . Cancer Neg Hx   . Early death Neg Hx   . Stroke Neg Hx   . Esophageal cancer Neg Hx   . Rectal cancer Neg Hx   . Stomach cancer Neg Hx   . Colon cancer Sister      Past Surgical History  Procedure Laterality Date  . Tonsillectomy    . Abdominal hysterectomy    . Colonoscopy    . Esophagogastroduodenoscopy N/A 12/23/2012    Procedure: ESOPHAGOGASTRODUODENOSCOPY (EGD);  Surgeon: Inda Castle, MD;  Location: [facility];  Service: Endoscopy;  Laterality: N/A;  . Balloon dilation N/A 12/23/2012    Procedure: BALLOON DILATION;  Surgeon: Inda Castle, MD;  Location: [facility];  Service: Endoscopy;  Laterality: N/A;  . Esophagogastroduodenoscopy N/A 01/15/2013    Procedure: ESOPHAGOGASTRODUODENOSCOPY (EGD);  Surgeon: Inda Castle, MD;  Location: [facility];  Service: Endoscopy;  Laterality: N/A;  . Balloon dilation N/A 01/15/2013    Procedure: BALLOON DILATION;  Surgeon: Inda Castle, MD;  Location: [facility] END[facility];  Service: Endoscopy;  Laterality: N/A;  . Cataract  both eyes    Social History   Social History  . Marital Status: Divorced    Spouse Name: N/A  . Number of Children: 1  . Years of Education: N/A   Occupational History  . Retired    Social History Main Topics  . Smoking status: Former Smoker -- 15 years    Types: Cigarettes    Quit date: 10/19/2002  . Smokeless tobacco: Never Used  . Alcohol Use: No  . Drug Use: No  . Sexual Activity: No   Other Topics Concern  . Not on file   Social History Narrative   Regular exercise-no   Caffeine Use-yes   worked at [employer]. (-) ETOH.   Allergies  Allergen Reactions  . Lipitor [Atorvastatin] Itching       Outpatient Prescriptions Prior to Visit  Medication Sig Dispense Refill  . aspirin EC 81 MG tablet Take 1 tablet (81 mg total) by mouth daily. 90 tablet 3  . Dextromethorphan-Guaifenesin (CORICIDIN HBP CONGESTION/COUGH) 10-200 MG CAPS Take 1 capsule by mouth daily as needed (for congestion).    . Fluticasone-Salmeterol (ADVAIR DISKUS) 100-50 MCG/DOSE AEPB Inhale 1 puff into the lungs 2 (two) times daily. Must keep March appt for future refills 60 each 0  . gabapentin (NEURONTIN) 100 MG capsule Take 1 capsule (100 mg total) by mouth 3 (three) times daily. 90 capsule 2  . lisinopril (PRINIVIL,ZESTRIL) 10 MG tablet Take 1 tablet (10 mg total) by mouth daily. (Patient taking differently: Take 10 mg by mouth every evening. ) 90 tablet 3  . lisinopril (PRINIVIL,ZESTRIL) 20 MG tablet Take 1 tablet (20 mg total) by mouth daily. Must keep march appt for future refills (Patient taking differently: Take 20 mg by mouth every morning. Must keep march appt for future refills) 90 tablet 3  . omeprazole (PRILOSEC) 40 MG capsule Take 1 capsule (40 mg total) by mouth 2 (two) times daily before a meal. Take before breakfast and before dinner 60 capsule 3  . pravastatin (PRAVACHOL) 20 MG tablet Take 1 tablet (20 mg total) by mouth daily. (Patient taking differently: Take 20 mg by mouth every evening. ) 90 tablet 3  . PROAIR HFA 108 (90 Base) MCG/ACT inhaler Inhale 2 puffs into the lungs every 6 (six) hours as needed for shortness of breath.      No facility-administered medications prior to visit.   Meds ordered this encounter  Medications  . DISCONTD: fluticasone furoate-vilanterol (BREO ELLIPTA) 200-25 MCG/INH AEPB    Sig: Inhale 1 puff into the lungs daily.    Dispense:  1 each    Refill:  0              Objective:   Physical Exam   Vitals:  Filed Vitals:   11/30/15 1543  BP: 138/78  Pulse: 88  Height: 5\' 4"  (1.626 m)  Weight: 123 lb 9.6 oz (56.065 kg)  SpO2: 96%     Constitutional/General:  Pleasant, well-nourished, well-developed, not in any distress,  Comfortably seating.  Well kempt  Body mass index is 21.21 kg/(m^2). Wt Readings from Last 3 Encounters:  11/30/15 123 lb 9.6 oz (56.065 kg)  11/26/15 125 lb (56.7 kg)  11/11/15 122 lb 12.8 oz (55.702 kg)    Neck circumference:   HEENT: Pupils equal and reactive to light and accommodation. Anicteric sclerae. Normal nasal mucosa.   No oral  lesions,  mouth clear,  oropharynx clear, no postnasal drip. (-) Oral thrush. No dental caries.  Airway - Mallampati  class III  Neck: No masses. Midline trachea. No JVD, (-) LAD. (-) bruits appreciated.  Respiratory/Chest: Grossly normal chest. (-) deformity. (-) Accessory muscle use.  Symmetric expansion. (-) Tenderness on palpation.  Resonant on percussion.  Diminished BS on both lower lung zones. (-) wheezing, rhonchi, crackles at bases (-) egophony  Cardiovascular: Regular rate and  rhythm, heart sounds normal, no murmur or gallops, no peripheral edema  Gastrointestinal:  Normal bowel sounds. Soft, non-tender. No hepatosplenomegaly.  (-) masses.   Musculoskeletal:  Normal muscle tone. Normal gait.   Extremities: Grossly normal. (-) clubbing, cyanosis.  (-) edema  Skin: (-) rash,lesions seen.   Neurological/Psychiatric : alert, oriented to time, place, person. Normal mood and affect          Assessment & Plan:  COPD (chronic obstructive pulmonary disease) ([place]) Pt with copd. 40PY, quit smoking in her 92s.  More sob since 09/2015. CXR (11/2015) >> inc markings at bases Plan : 1. Inc advair to 250/50 (if with sample); if not try breo the higher dose. 2. Pt to call in 1-2 weeks >> may need to switch to neb meds with pulmicort bid and duoneb 4x/d. Or add spiriva 3. UTD with vaccine 4. Will need alpha one 5. Needs PFT.    I personally reviewed previous images (Chest Xray, Chest Ct scan) done on this patient. I reviewed the reports  on the images as well.    Addendum 02/09/2016  Pt called the office and stated that Breo did NOT work. She has issues using MDIs. Her SOB is not better. It is worse.  We will try her on Budesonide 0.5 mg BID and Duoneb QID and q4hrs prn. Pt aware.   Exertional dyspnea Recent sob since 09/2015 pna. Rx copd. May need cardiac w/u if not better    Thank you very much for letting me participate in this patient's care. Please do not hesitate to give me a call if you have any questions or concerns regarding the treatment plan.   Patient will follow up with me in 2-3 mos. Pt to call if not better.     Monica Becton, MD 02/09/2016   1:42 PM Pulmonary and [place] Pager: [phone] Office: [phone], Fax: [phone]

## 2015-12-01 DIAGNOSIS — R0609 Other forms of dyspnea: Secondary | ICD-10-CM | POA: Insufficient documentation

## 2015-12-01 DIAGNOSIS — R0600 Dyspnea, unspecified: Secondary | ICD-10-CM | POA: Insufficient documentation

## 2015-12-01 DIAGNOSIS — J449 Chronic obstructive pulmonary disease, unspecified: Secondary | ICD-10-CM | POA: Insufficient documentation

## 2015-12-01 DIAGNOSIS — J441 Chronic obstructive pulmonary disease with (acute) exacerbation: Secondary | ICD-10-CM | POA: Insufficient documentation

## 2015-12-01 NOTE — Assessment & Plan Note (Signed)
Recent sob since 09/2015 pna. Rx copd. May need cardiac w/u if not better

## 2015-12-01 NOTE — Assessment & Plan Note (Addendum)
Pt with copd. 40PY, quit smoking in her 26s.  More sob since 09/2015. CXR (11/2015) >> inc markings at bases Plan : 1. Inc advair to 250/50 (if with sample); if not try breo the higher dose. 2. Pt to call in 1-2 weeks >> may need to switch to neb meds with pulmicort bid and duoneb 4x/d. Or add spiriva 3. UTD with vaccine 4. Will need alpha one 5. Needs PFT.    I personally reviewed previous images (Chest Xray, Chest Ct scan) done on this patient. I reviewed the reports on the images as well.    Addendum 02/09/2016  Pt called the office and stated that Breo did NOT work. She has issues using MDIs. Her SOB is not better. It is worse.  We will try her on Budesonide 0.5 mg BID and Duoneb QID and q4hrs prn. Pt aware.

## 2015-12-07 ENCOUNTER — Telehealth: Payer: Self-pay | Admitting: Pulmonary Disease

## 2015-12-07 MED ORDER — FLUTICASONE FUROATE-VILANTEROL 200-25 MCG/INH IN AEPB: 1.0000 | INHALATION_SPRAY | Freq: Every day | RESPIRATORY_TRACT | Status: DC

## 2015-12-07 NOTE — Telephone Encounter (Signed)
Last ov with AD on 11/30/15 Patient Instructions     1. We will schedule you for a breathing test. 2. We will increase her Breo 200, one puff once a day. Rinse her mouth each time using it. 3. Give [place] a call after 1 week. Let us know if you want [place] to switch you to a nebulizer or add Spiriva.  Return to clinic in 2 mos.    Called and spoke with pt. She states that the [facility] does seem to be helping and would like a rx sent to the pharmacy. I verified the pharmacy as  [facility] in [place]. She voiced understanding and had no further questions. Rx sent. Nothing further needed.

## 2015-12-23 ENCOUNTER — Telehealth: Payer: Self-pay | Admitting: Gastroenterology

## 2015-12-27 NOTE — Telephone Encounter (Signed)
ok 

## 2015-12-28 ENCOUNTER — Ambulatory Visit (HOSPITAL_COMMUNITY): Admission: RE | Admit: 2015-12-28 | Payer: Medicare HMO | Source: Ambulatory Visit | Admitting: Gastroenterology

## 2015-12-28 ENCOUNTER — Encounter (HOSPITAL_COMMUNITY): Admission: RE | Payer: Self-pay | Source: Ambulatory Visit

## 2015-12-28 SURGERY — ESOPHAGOGASTRODUODENOSCOPY (EGD) WITH PROPOFOL
Anesthesia: Monitor Anesthesia Care

## 2016-01-02 IMAGING — MR MR HEAD W/O CM
9 of 11 series · 34 of 48 positions shown · non-contrast
Comparison: CT head October 02, 2014 at 4983 hr and MRI of the head
July 30, 2014

CLINICAL DATA: Worsening RIGHT-sided paresthesias and tightness in
RIGHT lower extremity, which began 7 days ago. LEFT thalamus
hemorrhage in July 2014. Hypertension.

EXAM:
MRI HEAD WITHOUT CONTRAST
TECHNIQUE: Multiplanar, multiecho pulse sequences of the brain and surrounding
structures were obtained without intravenous contrast.

[Series 2: FLAIR · sagittal · 5.0mm · 0.47mm/px · 2 of 23 slices shown (1 of 2)]
[im 1/23]
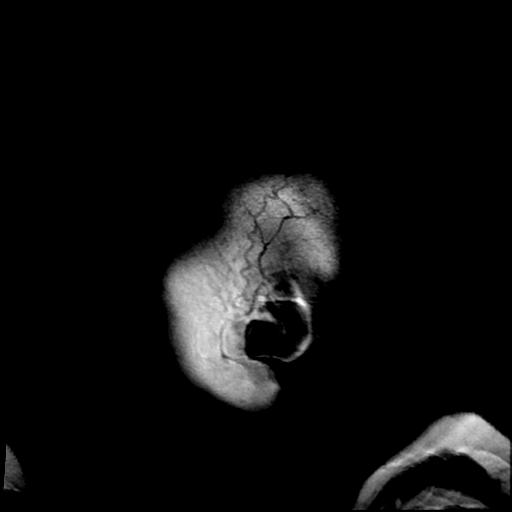
[im 23/23]
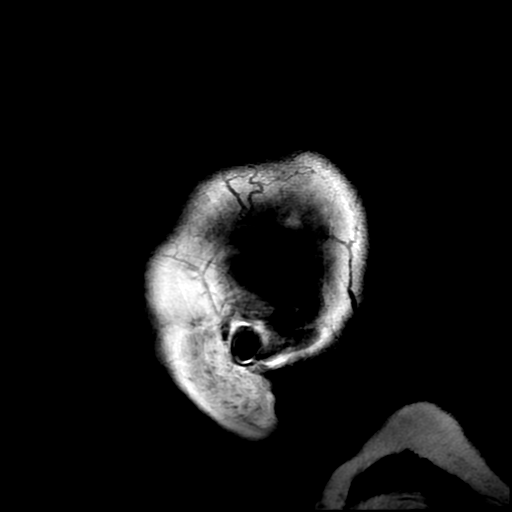

[Series 4: DWI · axial · 3.0mm · 0.94mm/px · z∈[-40,+82]mm · 9 of 94 slices shown (1 of 4)]
[im 1/94]
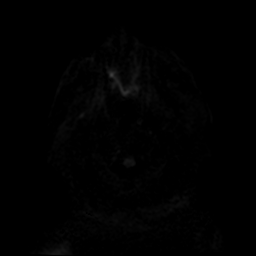
[im 12/94]
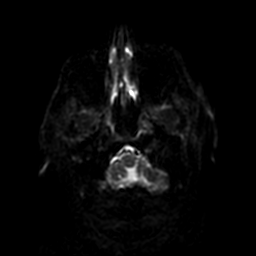
[im 24/94]
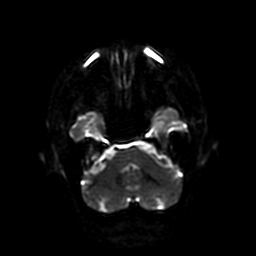
[im 35/94]
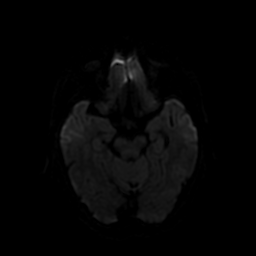
[im 47/94]
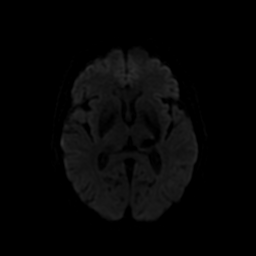
[im 59/94]
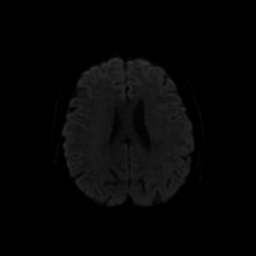
[im 70/94]
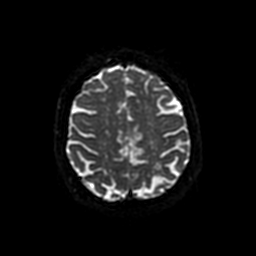
[im 82/94]
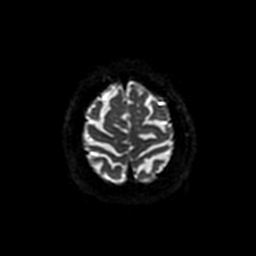
[im 94/94]
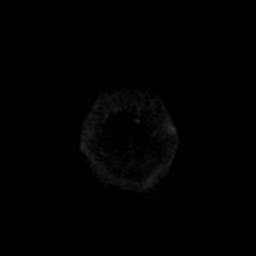

[Series 5: DWI · coronal · 5.0mm · 0.94mm/px · 6 of 60 slices shown (2 of 4)]
[im 1/60]
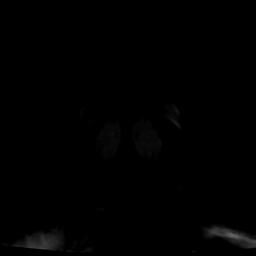
[im 12/60]
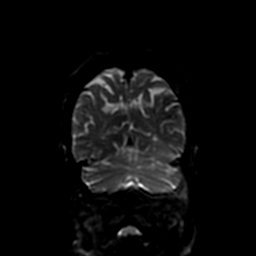
[im 24/60]
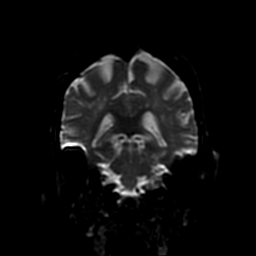
[im 36/60]
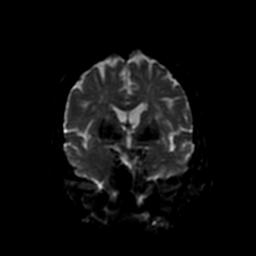
[im 48/60]
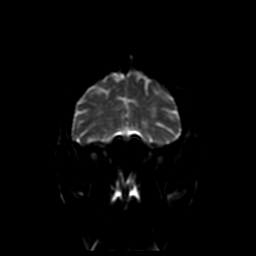
[im 60/60]
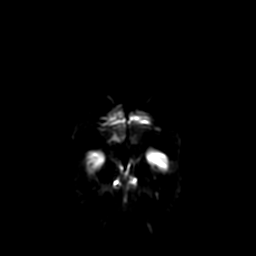

[Series 6: T2 · axial · 5.0mm · 0.47mm/px · z∈[-40,+82]mm · 2 of 24 slices shown (1 of 2)]
[im 1/24]
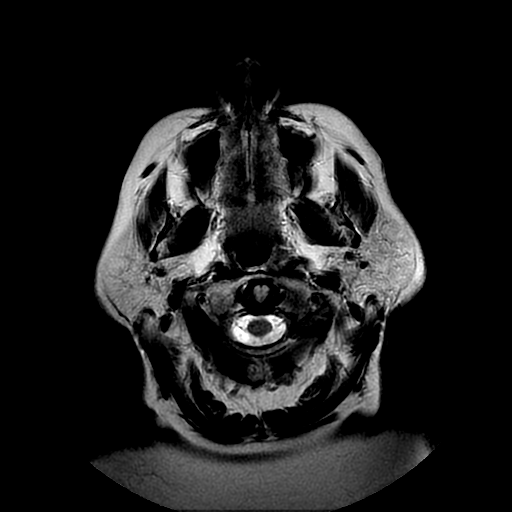
[im 24/24]
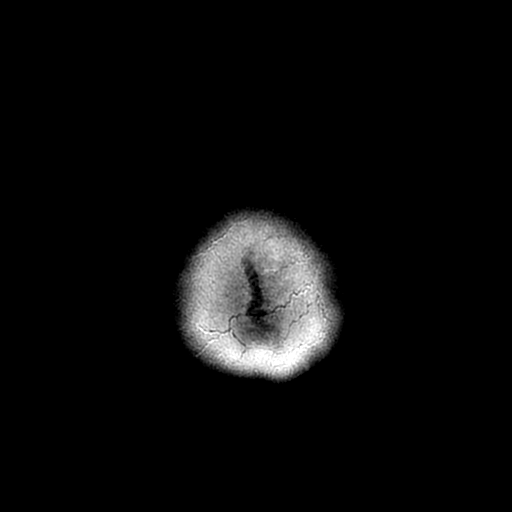

[Series 7: FLAIR · axial · 5.0mm · 0.47mm/px · z∈[-40,+82]mm · 2 of 24 slices shown (2 of 2)]
[im 1/24]
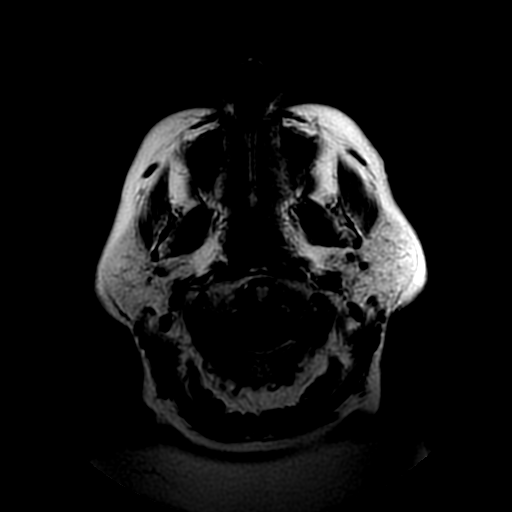
[im 24/24]
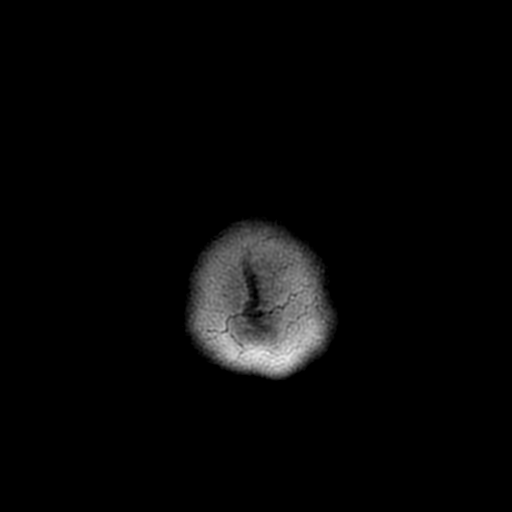

[Series 8: (person_name) · axial · 3.0mm · 0.47mm/px · z∈[-41,+5]mm · 4 of 96 slices shown]
[im 1/96]
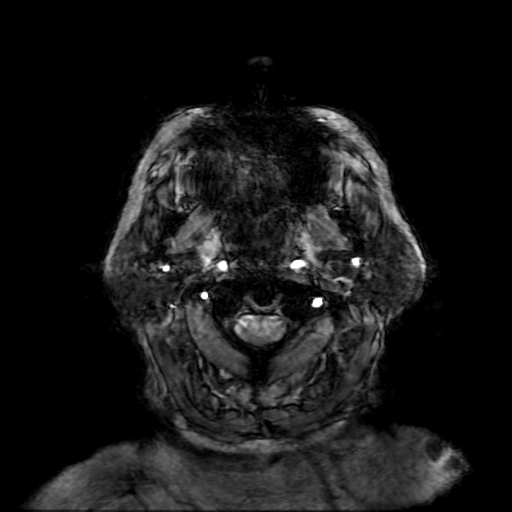
[im 12/96]
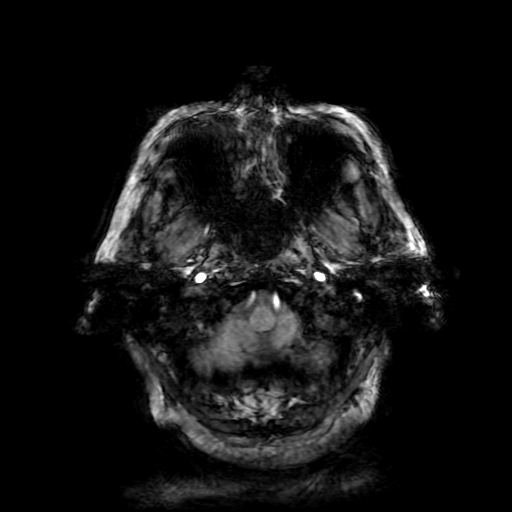
[im 24/96]
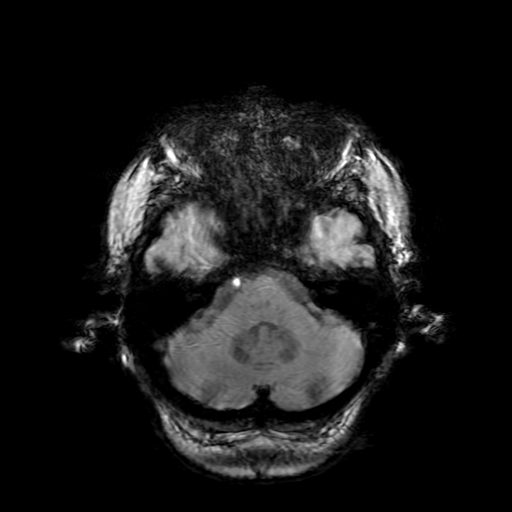
[im 36/96]
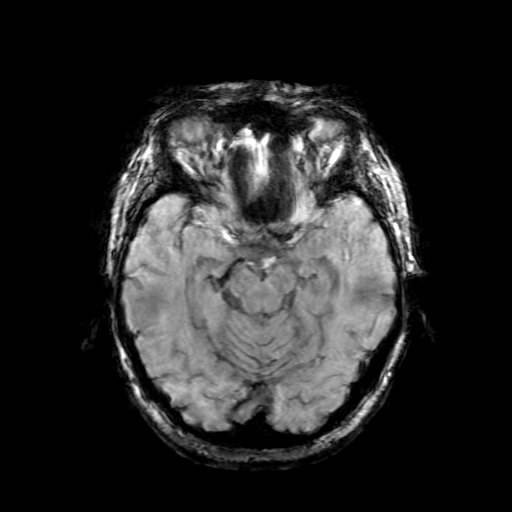

[Series 10: T2 · coronal · 5.0mm · 0.47mm/px · 2 of 25 slices shown (2 of 2)]
[im 1/25]
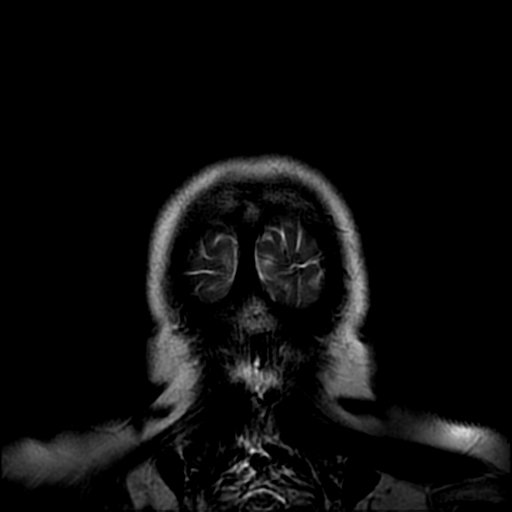
[im 25/25]
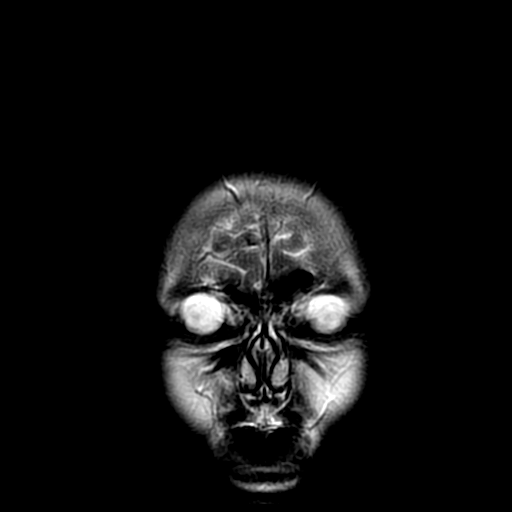

[Series 400: DWI · axial · 3.0mm · 0.94mm/px · z∈[-40,+82]mm · 4 of 47 slices shown (3 of 4)]
[im 1/47]
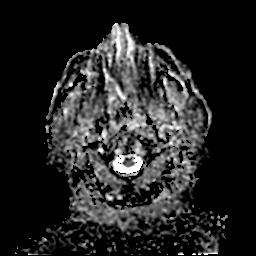
[im 16/47]
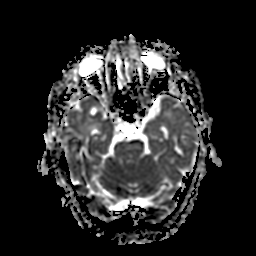
[im 31/47]
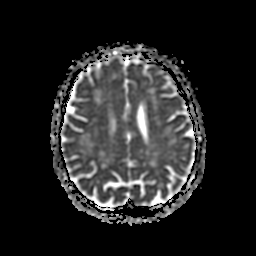
[im 47/47]
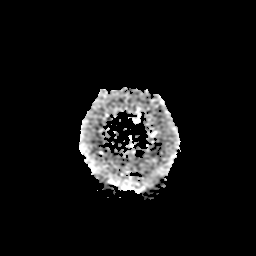

[Series 500: DWI · coronal · 5.0mm · 0.94mm/px · 3 of 30 slices shown (4 of 4)]
[im 1/30]
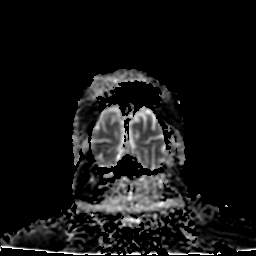
[im 15/30]
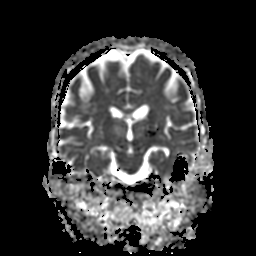
[im 30/30]
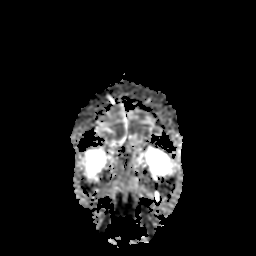

[34 of 48 positions shown; findings below may reference images not displayed]

FINDINGS: No reduced diffusion to suggest acute ischemia. Subcentimeter focus
of susceptibility artifact in RIGHT pons. Susceptibility artifact in
RIGHT basal ganglia and LEFT thalamus. Please note, [REDACTED] is
more sensitive for blood products than MPGR which was performed
previously.

Remote LEFT thalamic hemorrhagic infarct, resolution of edema and
mass effect. Moderate to severe white matter changes, similar to
prior imaging. No midline shift or mass effect. The ventricles and
sulci overall normal for patient's age.

No abnormal extra-axial fluid collections. Normal major intracranial
vascular flow voids observed at the skull base.

Status post bilateral ocular lens implants. No paranasal sinus
air-fluid levels. Trace LEFT mastoid effusion. No abnormal sellar
expansion. No cerebellar tonsillar ectopia. Generally bright T1 bone
marrow signal suggests osteopenia.
IMPRESSION: No acute intracranial process, specifically no evidence of acute
ischemia.

Remote LEFT thalamus hemorrhagic infarct with resolution of edema. A
few additional subcentimeter foci of susceptibility artifact may
reflect micro hemorrhages from hypertension though was not are
nonspecific. These are more conspicuous on today's examination which
is likely technical.

Moderate to severe white matter changes can be seen with chronic
small vessel ischemic disease.

  By: TEIGO

## 2016-01-24 ENCOUNTER — Ambulatory Visit: Payer: Medicare HMO | Admitting: Neurology

## 2016-02-04 DIAGNOSIS — R071 Chest pain on breathing: Secondary | ICD-10-CM | POA: Diagnosis not present

## 2016-02-04 DIAGNOSIS — S20219A Contusion of unspecified front wall of thorax, initial encounter: Secondary | ICD-10-CM | POA: Diagnosis not present

## 2016-02-07 ENCOUNTER — Telehealth: Payer: Self-pay | Admitting: Pulmonary Disease

## 2016-02-07 DIAGNOSIS — K219 Gastro-esophageal reflux disease without esophagitis: Secondary | ICD-10-CM | POA: Diagnosis not present

## 2016-02-07 DIAGNOSIS — Z0000 Encounter for general adult medical examination without abnormal findings: Secondary | ICD-10-CM | POA: Diagnosis not present

## 2016-02-07 DIAGNOSIS — E7800 Pure hypercholesterolemia, unspecified: Secondary | ICD-10-CM | POA: Diagnosis not present

## 2016-02-07 DIAGNOSIS — J438 Other emphysema: Secondary | ICD-10-CM

## 2016-02-07 DIAGNOSIS — J449 Chronic obstructive pulmonary disease, unspecified: Secondary | ICD-10-CM

## 2016-02-07 DIAGNOSIS — I10 Essential (primary) hypertension: Secondary | ICD-10-CM | POA: Diagnosis not present

## 2016-02-07 MED ORDER — BUDESONIDE 0.5 MG/2ML IN SUSP: 0.5000 mg | Freq: Two times a day (BID) | RESPIRATORY_TRACT | Status: DC

## 2016-02-07 MED ORDER — IPRATROPIUM-ALBUTEROL 0.5-2.5 (3) MG/3ML IN SOLN: 3.0000 mL | Freq: Four times a day (QID) | RESPIRATORY_TRACT | Status: DC

## 2016-02-07 NOTE — Telephone Encounter (Signed)
   Can we order pt a nebulizer and switch pt  From Breo to : Budesonide 0.5 mg BID and Duoneb QID and q 4 hrs prn. ?  Thanks. Pls tell her to call agin if not better.   AD

## 2016-02-07 NOTE — Telephone Encounter (Signed)
Spoke with patient-she is aware of change in medication. Order placed to [facility] to start patient on nebulizer and meds.

## 2016-02-07 NOTE — Telephone Encounter (Signed)
Spoke with pt. States that Breo 200 is not working well. Reports increased SOB and "just not being able to catch my breath." Pt would like an alternative medication.  AD - please advise. Thanks.

## 2016-02-07 NOTE — Addendum Note (Signed)
Addended by: Lorane Gell on: 02/07/2016 05:18 PM   Modules accepted: Orders

## 2016-02-08 ENCOUNTER — Telehealth: Payer: Self-pay | Admitting: Pulmonary Disease

## 2016-02-08 NOTE — Telephone Encounter (Signed)
Spoke with Jeani Hawking at [facility]  She states that insurance will not cover meds Budesonide or Duoneb b/c these meds were not mentioned in the pt's last ov note from 11/30/15  She states we need to addend that note and mention the medications and why they are being prescribed  Once this is done we can fax note to her  Please advise thanks

## 2016-02-09 DIAGNOSIS — J438 Other emphysema: Secondary | ICD-10-CM | POA: Diagnosis not present

## 2016-02-09 DIAGNOSIS — J449 Chronic obstructive pulmonary disease, unspecified: Secondary | ICD-10-CM | POA: Diagnosis not present

## 2016-02-09 NOTE — Telephone Encounter (Signed)
Note printed and faxed to [facility]. She is aware. Nothing further needed.

## 2016-02-09 NOTE — Telephone Encounter (Signed)
   Note has been addended.Pls send my note. Thanks  AD

## 2016-02-10 ENCOUNTER — Ambulatory Visit (INDEPENDENT_AMBULATORY_CARE_PROVIDER_SITE_OTHER): Payer: Medicare HMO | Admitting: Internal Medicine

## 2016-02-10 ENCOUNTER — Encounter: Payer: Self-pay | Admitting: Internal Medicine

## 2016-02-10 VITALS — BP 160/80 | HR 95 | Temp 98.3°F | Resp 20 | Ht 60.0 in | Wt 127.0 lb

## 2016-02-10 DIAGNOSIS — I10 Essential (primary) hypertension: Secondary | ICD-10-CM | POA: Diagnosis not present

## 2016-02-10 MED ORDER — LISINOPRIL-HYDROCHLOROTHIAZIDE 20-12.5 MG PO TABS: 1.0000 | ORAL_TABLET | Freq: Every day | ORAL | Status: DC

## 2016-02-10 NOTE — Progress Notes (Signed)
Pre visit review using our clinic review tool, if applicable. No additional management support is needed unless otherwise documented below in the visit note. 

## 2016-02-10 NOTE — Patient Instructions (Signed)
We have sent in the lisinopril/hctz. Take 1 pill in the morning daily. This should help some with the blood pressure and fluid.   It is okay to use the compression sock on the right foot.   We have given you the handicapped sticker.

## 2016-02-10 NOTE — Progress Notes (Signed)
   Subjective:    Patient ID: Sherry Lambert, female    DOB: September 23, 1940, 75 y.o.   MRN: [id number]  HPI The patient is a 75 YO female coming in for high BP at home. She has had a cough the last several weeks which was causing some pain in her left flank. No fevers or chills. She was given meloxicam at the urgent care she went to for treatment. Denies headache, SOB, chest pains. No nausea or vomiting. Still taking her lisinopril 30 mg daily.   Review of Systems  Constitutional: Negative for fever, chills, activity change, appetite change, fatigue and unexpected weight change.  HENT: Negative.   Eyes: Negative.   Respiratory: Positive for cough. Negative for chest tightness, shortness of breath and wheezing.   Cardiovascular: Negative for chest pain, palpitations and leg swelling.  Gastrointestinal: Negative for nausea, abdominal pain, diarrhea, constipation and abdominal distention.  Musculoskeletal: Positive for myalgias and gait problem. Negative for back pain and arthralgias.  Skin: Negative.   Neurological: Positive for numbness. Negative for dizziness, seizures, speech difficulty, weakness, light-headedness and headaches.      Objective:   Physical Exam  Constitutional: She is oriented to person, place, and time. She appears well-developed and well-nourished.  HENT:  Head: Normocephalic and atraumatic.  Eyes: EOM are normal.  Neck: Normal range of motion.  Cardiovascular: Normal rate and regular rhythm.   Pulmonary/Chest: Effort normal and breath sounds normal. No respiratory distress. She has no wheezes. She has no rales.  Abdominal: Soft. Bowel sounds are normal. She exhibits no distension. There is no tenderness. There is no rebound.  Musculoskeletal: She exhibits no edema.  Neurological: She is alert and oriented to person, place, and time. Coordination abnormal.  Cane for ambulation  Skin: Skin is warm and dry.   Filed Vitals:   02/10/16 1322 02/10/16 1404  BP: 204/80  160/80  Pulse: 95   Temp: 98.3 F (36.8 C)   TempSrc: Oral   Resp: 20   Height: 5' (1.524 m)   Weight: 127 lb (57.607 kg)   SpO2: 94%       Assessment & Plan:

## 2016-02-10 NOTE — Assessment & Plan Note (Signed)
BP is elevated which is unusual for her. Suspect some of the reason is recent meloxicam usage. She will stop. She is interested in changing to add a fluid medicine to the blood pressure medicine for her swelling in her right hand and foot after the stroke. Rx for lisinopril/hctz. Will monitor for BP after start at home and call with readings.

## 2016-02-13 ENCOUNTER — Encounter: Payer: Self-pay | Admitting: Pulmonary Disease

## 2016-02-13 ENCOUNTER — Ambulatory Visit (INDEPENDENT_AMBULATORY_CARE_PROVIDER_SITE_OTHER): Payer: Medicare HMO | Admitting: Pulmonary Disease

## 2016-02-13 ENCOUNTER — Other Ambulatory Visit: Payer: Medicare HMO

## 2016-02-13 VITALS — BP 122/78 | HR 93 | Ht 60.0 in | Wt 123.0 lb

## 2016-02-13 DIAGNOSIS — R0609 Other forms of dyspnea: Secondary | ICD-10-CM | POA: Diagnosis not present

## 2016-02-13 DIAGNOSIS — J438 Other emphysema: Secondary | ICD-10-CM | POA: Diagnosis not present

## 2016-02-13 DIAGNOSIS — J449 Chronic obstructive pulmonary disease, unspecified: Secondary | ICD-10-CM

## 2016-02-13 DIAGNOSIS — R0600 Dyspnea, unspecified: Secondary | ICD-10-CM

## 2016-02-13 LAB — PULMONARY FUNCTION TEST
DL/VA % pred: 64 %
DL/VA: 2.72 ml/min/mmHg/L
DLCO COR % PRED: 49 %
DLCO COR: 9.26 ml/min/mmHg
DLCO UNC % PRED: 51 %
DLCO UNC: 9.75 ml/min/mmHg
FEF 25-75 PRE: 0.37 L/s
FEF 25-75 Post: 0.49 L/sec
FEF2575-%Change-Post: 33 %
FEF2575-%Pred-Post: 32 %
FEF2575-%Pred-Pre: 24 %
FEV1-%CHANGE-POST: 15 %
FEV1-%PRED-POST: 53 %
FEV1-%Pred-Pre: 46 %
FEV1-PRE: 0.82 L
FEV1-Post: 0.94 L
FEV1FVC-%Change-Post: 3 %
FEV1FVC-%Pred-Pre: 60 %
FEV6-%CHANGE-POST: 10 %
FEV6-%PRED-POST: 85 %
FEV6-%Pred-Pre: 76 %
FEV6-POST: 1.93 L
FEV6-PRE: 1.74 L
FEV6FVC-%CHANGE-POST: 0 %
FEV6FVC-%PRED-POST: 100 %
FEV6FVC-%PRED-PRE: 100 %
FVC-%Change-Post: 11 %
FVC-%Pred-Post: 84 %
FVC-%Pred-Pre: 76 %
FVC-Post: 2.02 L
FVC-Pre: 1.82 L
POST FEV6/FVC RATIO: 95 %
PRE FEV6/FVC RATIO: 96 %
Post FEV1/FVC ratio: 47 %
Pre FEV1/FVC ratio: 45 %

## 2016-02-13 MED ORDER — UMECLIDINIUM BROMIDE 62.5 MCG/INH IN AEPB: 1.0000 | INHALATION_SPRAY | Freq: Every day | RESPIRATORY_TRACT | Status: DC

## 2016-02-13 NOTE — Assessment & Plan Note (Addendum)
Pt with copd. 40PY, quit smoking in her 90s.  More sob since 09/2015. CXR (11/2015) >> inc markings at bases PFT (02/2016)  FEV1  0.82 or 46%.   Plan : 1. Will switch to [employer] [facility]. Neb meds are time consuming.  2. Start Incruse 1 puff daily.  3. Hold off on neb meds. Has pulmicort and duoneb. May use duoneb q4 prn as well as alb MDI q4.  4. UTD with vaccine 5. Will need alpha one 6. Start Pulm Rehab 7. Plan for ONO. May need sleep study as well.

## 2016-02-13 NOTE — Progress Notes (Signed)
PFT done today. 

## 2016-02-13 NOTE — Assessment & Plan Note (Addendum)
Recent sob since 09/2015 pna. Rx copd. May need cardiac w/u if not better Start pulm rehab.

## 2016-02-13 NOTE — Progress Notes (Signed)
Subjective:    Patient ID: Sherry Lambert, female    DOB: [birth year], 75 y.o.   MRN: [id number]  HPI   This is the case of Sherry Lambert, 75 y.o. Female, who was referred by Dr. [name]  in consultation regarding SOB.   As you very well know, patient has a 76 PY smoking history, quit when she was in her 34s.  Pt was dxed with copd couple yrs ago. She is on advair 100/50 1P BID. Using it x 3 yrs.  She was dxed with PNA in 09/2015. Went to ER >> was dxed with PNA. Took abx x 10days.  She slowly improved >> not at baseline.  Has proiair  1x/week.  Lives by herself, independent in [place].  Was at ER weekend >> CXR was OK.   Over all, is still more SOB than baseline.   DATA PFT (02/13/16)  FEV1/FVC 45%, FEV1  0.82  46%. No hyperinflation. DLCO  51%  ROV (02/13/16)  Pt returns to office as f/u. Started on neb meds < 1 week. No recnt infxn or abx. Pulled L chest muscle last week. She feels better on Breo. Neb meds are too time consuming.  SOB better now the last week.  Has not been admitted nor has been on abx since last seen.         Review of Systems  Constitutional: Negative.  Negative for fever and unexpected weight change.  HENT: Positive for congestion and postnasal drip. Negative for dental problem, ear pain, nosebleeds, rhinorrhea, sinus pressure, sneezing, sore throat and trouble swallowing.   Eyes: Negative.  Negative for redness and itching.  Respiratory: Positive for chest tightness and shortness of breath. Negative for cough and wheezing.   Cardiovascular: Positive for leg swelling. Negative for palpitations.  Gastrointestinal: Negative.  Negative for nausea and vomiting.  Endocrine: Negative.   Genitourinary: Negative.  Negative for dysuria.  Musculoskeletal: Positive for arthralgias. Negative for joint swelling.  Skin: Negative for rash.  Allergic/Immunologic: Positive for environmental allergies.  Neurological: Negative.  Negative for headaches.    Hematological: Bruises/bleeds easily.  Psychiatric/Behavioral: Negative.  Negative for dysphoric mood. The patient is not nervous/anxious.           Objective:   Physical Exam   Vitals:  Filed Vitals:   02/13/16 1410  BP: 122/78  Pulse: 93  Height: 5' (1.524 m)  Weight: 123 lb (55.792 kg)  SpO2: 95%    Constitutional/General:  Pleasant, well-nourished, well-developed, not in any distress,  Comfortably seating.  Well kempt  Body mass index is 24.02 kg/(m^2). Wt Readings from Last 3 Encounters:  02/13/16 123 lb (55.792 kg)  02/10/16 127 lb (57.607 kg)  11/30/15 123 lb 9.6 oz (56.065 kg)    HEENT: Pupils equal and reactive to light and accommodation. Anicteric sclerae. Normal nasal mucosa.   No oral  lesions,  mouth clear,  oropharynx clear, no postnasal drip. (-) Oral thrush. No dental caries.  Airway - Mallampati class III  Neck: No masses. Midline trachea. No JVD, (-) LAD. (-) bruits appreciated.  Respiratory/Chest: Grossly normal chest. (-) deformity. (-) Accessory muscle use.  Symmetric expansion. (-) Tenderness on palpation.  Resonant on percussion.  Diminished BS on both lower lung zones. (-)rhonchi, crackles at bases. Some wheezing bilaterally.  (-) egophony  Cardiovascular: Regular rate and  rhythm, heart sounds normal, no murmur or gallops, no peripheral edema  Gastrointestinal:  Normal bowel sounds. Soft, non-tender. No hepatosplenomegaly.  (-) masses.   Musculoskeletal:  Normal muscle tone. Normal gait.   Extremities: Grossly normal. (-) clubbing, cyanosis.  (-) edema  Skin: (-) rash,lesions seen.   Neurological/Psychiatric : alert, oriented to time, place, person. Normal mood and affect          Assessment & Plan:  Other emphysema    COPD (chronic obstructive pulmonary disease) ([place]) Pt with copd. 40PY, quit smoking in her 48s.  More sob since 09/2015. CXR (11/2015) >> inc markings at bases PFT (02/2016)  FEV1  0.82 or 46%.   Plan  : 1. Will switch to [facility]. Neb meds are time consuming.  2. Start Incruse 1 puff daily.  3. Hold off on neb meds. Has pulmicort and duoneb. May use duoneb q4 prn as well as alb MDI q4.  4. UTD with vaccine 5. Will need alpha one 6. Start Pulm Rehab 7. Plan for ONO. May need sleep study as well.       Exertional dyspnea Recent sob since 09/2015 pna. Rx copd. May need cardiac w/u if not better Start pulm rehab.      Patient will follow up with me in 3-4 mos. Pt to call if not better.   I spent at least 25 minutes with the patient today and more than 50% was spent counseling the patient regarding disease and management and facilitating labs and medications.      Monica Becton, MD 02/14/2016   2:12 AM Pulmonary and [place] Pager: [phone] Office: [phone], Fax: [phone]

## 2016-02-13 NOTE — Patient Instructions (Signed)
It was a pleasure taking care of you today!  You are diagnosed with Chronic Obstructive Pulmonary Disease or COPD.  COPD is a preventable and treatable disease that makes it difficult to empty air out of the lungs (airflow obstruction).  This can lead to shortness of breath.   Sometimes, when you have a lung infection, this can make your breathing worse, and will cause you to have a COPD flare-up or an acute exacerbation of COPD. Please call your primary care doctor or the office if you are having a COPD flare-up.   Smoking makes COPD worse.   Make sure you use your medications for COPD -- Maintenance medications : Breo 1 puff daily. Incruse 1 puff daily.   Rescue medications: Albuterol 2 puffs every 4 hours as needed for shortness of breath or Duoneb every 4 hrs as needed for shortness of breath.   Please rinse your mouth each time you use your maintenance medication.  Please call the office if you are having issues with your medications.  We will order an overnight finger test and alpha one test.   We will order pulmonary reahb.   Return to clinic in 3-4 mos.

## 2016-02-14 ENCOUNTER — Telehealth: Payer: Self-pay | Admitting: Pulmonary Disease

## 2016-02-14 NOTE — Telephone Encounter (Signed)
Will forward this to [facility] to be done

## 2016-02-14 NOTE — Telephone Encounter (Signed)
Printed order, signature stamped it & faxed to [facility].  Nothing further needed.

## 2016-02-16 LAB — ALPHA-1 ANTITRYPSIN PHENOTYPE: A1 ANTITRYPSIN: 195 mg/dL (ref 83–199)

## 2016-02-17 ENCOUNTER — Telehealth: Payer: Self-pay | Admitting: Pulmonary Disease

## 2016-02-17 NOTE — Telephone Encounter (Signed)
Spoke with pt. States that she received a letter from her insurance company. The letter stated that budesonide was not going to be covered. At her last OV, AD switched her to inhalers. Advised that pt since she will no longer be using budesonide she can disregard that letter. She verbalized understanding. Nothing further was needed.

## 2016-02-20 ENCOUNTER — Telehealth: Payer: Self-pay | Admitting: Pulmonary Disease

## 2016-02-20 NOTE — Telephone Encounter (Signed)
Attempted to contact pt. No answer, no option to leave a message. Will try back.  

## 2016-02-21 MED ORDER — IPRATROPIUM-ALBUTEROL 0.5-2.5 (3) MG/3ML IN SOLN: 3.0000 mL | Freq: Four times a day (QID) | RESPIRATORY_TRACT | Status: DC

## 2016-02-21 NOTE — Telephone Encounter (Signed)
Spoke with patient, states that her medication is not covered by insurance, states that she was told it needed "an appeal" and I asked if it was a Prior Authorization instead and she states that she was unsure what they need exactly.   [facility] - spoke with Jonelle Sidle States that the Ipratropium Albuterol neb is not showing up in their pharmacy list - records shows that this was sent 02/07/16 by Dr Corrie Dandy.  Ipratropium is covered by insurance for 0.39  Called pt and aware that she can pick up the Duoneb whenever she is ready for it. Nothing further needed.

## 2016-02-23 DIAGNOSIS — J439 Emphysema, unspecified: Secondary | ICD-10-CM | POA: Diagnosis not present

## 2016-03-07 ENCOUNTER — Ambulatory Visit: Payer: Medicare HMO | Admitting: Sports Medicine

## 2016-03-08 ENCOUNTER — Telehealth: Payer: Self-pay | Admitting: Pulmonary Disease

## 2016-03-08 NOTE — Telephone Encounter (Signed)
pls tell pt ONO showed significant O2 desaturation. Not sure if she has OSA. pls ask her if she wants to do a sleep study (home or lab). If not let me know.  Thanks.   AD

## 2016-03-09 NOTE — Telephone Encounter (Signed)
Spoke with pt. She is aware of ONO results. Pt does not want have a sleep study done at this time. Will route message to AD per his request.

## 2016-03-15 ENCOUNTER — Encounter: Payer: Self-pay | Admitting: Pulmonary Disease

## 2016-03-23 ENCOUNTER — Ambulatory Visit: Payer: Medicare HMO | Admitting: Podiatry

## 2016-03-23 DIAGNOSIS — M79671 Pain in right foot: Secondary | ICD-10-CM | POA: Diagnosis not present

## 2016-03-23 DIAGNOSIS — M2041 Other hammer toe(s) (acquired), right foot: Secondary | ICD-10-CM | POA: Diagnosis not present

## 2016-03-26 ENCOUNTER — Telehealth: Payer: Self-pay | Admitting: Pulmonary Disease

## 2016-03-26 NOTE — Telephone Encounter (Signed)
I received a letter from [employer]. I am not sure if Breo and Incruse are covered. Who do we need to ask whether  breo and Incruse are  covered?  If not covered, what will be alternative? Thanks.  AD

## 2016-03-29 NOTE — Telephone Encounter (Signed)
Spoke with [place] and they state that both Breo and Incruse are covered for this pt.   Routing to AD for FYI.

## 2016-05-14 ENCOUNTER — Other Ambulatory Visit (INDEPENDENT_AMBULATORY_CARE_PROVIDER_SITE_OTHER): Payer: Medicare HMO

## 2016-05-14 ENCOUNTER — Ambulatory Visit (INDEPENDENT_AMBULATORY_CARE_PROVIDER_SITE_OTHER): Payer: Medicare HMO | Admitting: Internal Medicine

## 2016-05-14 ENCOUNTER — Encounter: Payer: Self-pay | Admitting: Internal Medicine

## 2016-05-14 VITALS — BP 130/60 | HR 77 | Temp 98.0°F | Resp 20 | Ht 60.0 in | Wt 123.0 lb

## 2016-05-14 DIAGNOSIS — I10 Essential (primary) hypertension: Secondary | ICD-10-CM | POA: Diagnosis not present

## 2016-05-14 DIAGNOSIS — E118 Type 2 diabetes mellitus with unspecified complications: Secondary | ICD-10-CM

## 2016-05-14 DIAGNOSIS — Z23 Encounter for immunization: Secondary | ICD-10-CM | POA: Diagnosis not present

## 2016-05-14 LAB — COMPREHENSIVE METABOLIC PANEL
ALT: 14 U/L (ref 0–35)
AST: 16 U/L (ref 0–37)
Albumin: 4.1 g/dL (ref 3.5–5.2)
Alkaline Phosphatase: 65 U/L (ref 39–117)
BUN: 19 mg/dL (ref 6–23)
CHLORIDE: 103 meq/L (ref 96–112)
CO2: 30 meq/L (ref 19–32)
Calcium: 10.2 mg/dL (ref 8.4–10.5)
Creatinine, Ser: 1.09 mg/dL (ref 0.40–1.20)
GFR: 52.16 mL/min — ABNORMAL LOW (ref >60.00)
Glucose, Bld: 103 mg/dL — ABNORMAL HIGH (ref 70–99)
Potassium: 4.4 mEq/L (ref 3.5–5.1)
SODIUM: 141 meq/L (ref 135–145)
Total Bilirubin: 0.4 mg/dL (ref 0.2–1.2)
Total Protein: 7.2 g/dL (ref 6.0–8.3)

## 2016-05-14 LAB — LIPID PANEL
CHOL/HDL RATIO: 2
Cholesterol: 153 mg/dL (ref 0–200)
HDL: 65.5 mg/dL (ref >39.00)
LDL Cholesterol: 69 mg/dL (ref 0–99)
NONHDL: 87.28
Triglycerides: 92 mg/dL (ref 0.0–149.0)
VLDL: 18.4 mg/dL (ref 0.0–40.0)

## 2016-05-14 LAB — HEMOGLOBIN A1C: HEMOGLOBIN A1C: 6.7 % — AB (ref 4.6–6.5)

## 2016-05-14 NOTE — Progress Notes (Signed)
Pre visit review using our clinic review tool, if applicable. No additional management support is needed unless otherwise documented below in the visit note. 

## 2016-05-14 NOTE — Progress Notes (Signed)
   Subjective:    Patient ID: Sherry Lambert, female    DOB: [birth year], 75 y.o.   MRN: [id number]  HPI The patient is a 75 YO female coming in for follow up of her blood pressure. We changed her lisinopril to lisinopril/hctz at last visit. She denies any side effects with the medicine. BP is doing well at home. She stopped taking the NSAIDs she had previously been taking while BP was high. Denies any new problems for [place] to address. She is seeing the orthopedic on Thursday for shoulder pain and foot pain.   Review of Systems  Constitutional: Negative for activity change, appetite change, chills, fatigue, fever and unexpected weight change.  HENT: Positive for congestion, postnasal drip and rhinorrhea.   Eyes: Negative.   Respiratory: Positive for cough. Negative for chest tightness, shortness of breath and wheezing.   Cardiovascular: Negative for chest pain, palpitations and leg swelling.  Gastrointestinal: Negative for abdominal distention, abdominal pain, constipation, diarrhea and nausea.  Musculoskeletal: Positive for gait problem and myalgias. Negative for arthralgias and back pain.  Skin: Negative.   Neurological: Positive for numbness. Negative for dizziness, seizures, speech difficulty, weakness, light-headedness and headaches.      Objective:   Physical Exam  Constitutional: She is oriented to person, place, and time. She appears well-developed and well-nourished.  HENT:  Head: Normocephalic and atraumatic.  Eyes: EOM are normal.  Neck: Normal range of motion.  Cardiovascular: Normal rate and regular rhythm.   Pulmonary/Chest: Effort normal and breath sounds normal. No respiratory distress. She has no wheezes. She has no rales.  Abdominal: Soft. Bowel sounds are normal. She exhibits no distension. There is no tenderness. There is no rebound.  Musculoskeletal: She exhibits no edema.  Neurological: She is alert and oriented to person, place, and time. Coordination abnormal.  Cane  for ambulation, still alteration in sensation.   Skin: Skin is warm and dry.   Vitals:   05/14/16 1302  BP: 130/60  Pulse: 77  Resp: 20  Temp: 98 F (36.7 C)  TempSrc: Oral  SpO2: 95%  Weight: 123 lb (55.8 kg)  Height: 5' (1.524 m)      Assessment & Plan:  Flu shot given at visit.

## 2016-05-14 NOTE — Assessment & Plan Note (Signed)
Checking HgA1c and lipid and CMP. Not on any meds but is on ACE-I. Complicated by stroke in the past.

## 2016-05-14 NOTE — Assessment & Plan Note (Signed)
BP much better controlled on lisinopril/hctz. Checking CMP and adjust as needed.

## 2016-05-14 NOTE — Patient Instructions (Signed)
We are checking the blood work today and will call you back with the results.   Hopefully the orthopedic doctor will be able to help the shoulder.   It is okay to start taking zyrtec (cetirizine) for the drainage. Take 1 pill daily.

## 2016-05-17 DIAGNOSIS — M2041 Other hammer toe(s) (acquired), right foot: Secondary | ICD-10-CM | POA: Diagnosis not present

## 2016-05-17 DIAGNOSIS — M79671 Pain in right foot: Secondary | ICD-10-CM | POA: Diagnosis not present

## 2016-05-23 ENCOUNTER — Ambulatory Visit (INDEPENDENT_AMBULATORY_CARE_PROVIDER_SITE_OTHER): Payer: Medicare HMO | Admitting: Pulmonary Disease

## 2016-05-23 ENCOUNTER — Encounter: Payer: Self-pay | Admitting: Pulmonary Disease

## 2016-05-23 ENCOUNTER — Other Ambulatory Visit: Payer: Medicare HMO

## 2016-05-23 DIAGNOSIS — J430 Unilateral pulmonary emphysema [MacLeod's syndrome]: Secondary | ICD-10-CM

## 2016-05-23 DIAGNOSIS — R0609 Other forms of dyspnea: Secondary | ICD-10-CM

## 2016-05-23 DIAGNOSIS — R0600 Dyspnea, unspecified: Secondary | ICD-10-CM

## 2016-05-23 MED ORDER — UMECLIDINIUM BROMIDE 62.5 MCG/INH IN AEPB: 1.0000 | INHALATION_SPRAY | Freq: Every day | RESPIRATORY_TRACT | 0 refills | Status: AC

## 2016-05-23 MED ORDER — FLUTICASONE FUROATE-VILANTEROL 200-25 MCG/INH IN AEPB: 1.0000 | INHALATION_SPRAY | Freq: Every day | RESPIRATORY_TRACT | 0 refills | Status: AC

## 2016-05-23 NOTE — Assessment & Plan Note (Addendum)
Recent sob since 09/2015 pna. Clinically improved and better. . May need cardiac w/u if she gets worse again.  Start pulm rehab once off doughnut whole. Maybe in 2018.

## 2016-05-23 NOTE — Patient Instructions (Signed)
It was a pleasure taking care of you today!  You are diagnosed with Chronic Obstructive Pulmonary Disease or COPD.  COPD is a preventable and treatable disease that makes it difficult to empty air out of the lungs (airflow obstruction).  This can lead to shortness of breath.   Sometimes, when you have a lung infection, this can make your breathing worse, and will cause you to have a COPD flare-up or an acute exacerbation of COPD. Please call your primary care doctor or the office if you are having a COPD flare-up.   Smoking makes COPD worse.   Make sure you use your medications for COPD -- Maintenance medications : Breo 1 puff every day, Incruse 1 puff every day.  Rescue medications: Duoneb every 4 hrs as needed for shortness of breath.   Please rinse your mouth each time you use your maintenance medication.  Please call the office if you are having issues with your medications  Return to clinic in 6 mos.

## 2016-05-23 NOTE — Assessment & Plan Note (Addendum)
Pt with copd. 40PY, quit smoking in her 42s.  More sob since 09/2015. Not at baseline.  CXR (11/2015) >> inc markings at bases PFT (02/2016)  FEV1  0.82 or 46%.   Plan : 1. Cont Breo. Neb meds were  time consuming (Pulmicort and duoneb).  2. Cont  Incruse 1 puff daily. She wants to see if she can wean off this med.  3. Hold off on neb meds. Has pulmicort and duoneb. May use duoneb q4 prn as well as alb MDI q4.  4. UTD with vaccine. Told daughter in law to ask re: PNA vaccine. Received flu shot this fall. 5. Will need alpha one today.  6. Will need  Pulm Rehab but she wants to hold off.  7. Had sig o2 desatn with ONO. She did NOT want to do PSG.  She wanted to hold off on O2 until 2018.  She will be in a doughnut hole soon and she is avoiding expenses.

## 2016-05-23 NOTE — Progress Notes (Signed)
Subjective:    Patient ID: Sherry Lambert, female    DOB: 04/07/1941, 75 y.o.   MRN: [id number]  HPI   This is the case of Sherry Lambert, 75 y.o. Female, who was referred by Dr. [name]  in consultation regarding SOB.   As you very well know, patient has a 41 PY smoking history, quit when she was in her 68s.  Pt was dxed with copd couple yrs ago. She is on advair 100/50 1P BID. Using it x 3 yrs.  She was dxed with PNA in 09/2015. Was slow to recover. Now at baseline.   DATA PFT (02/13/16)  FEV1/FVC 45%, FEV1  0.82  46%. No hyperinflation. DLCO  51%  ROV (02/13/16)  Pt returns to office as f/u. Started on neb meds < 1 week. No recnt infxn or abx. Pulled L chest muscle last week. She feels better on Breo. Neb meds are too time consuming.  SOB better now the last week.  Has not been admitted nor has been on abx since last seen.   ROV 05/23/16. Patient returns to office as follow-up on her COPD. She continues to feel better with Breo and Incruse. She wonders if incruse is making a difference > she sometimes forget to use it. SOB at baseline.  She will be in a doughnut hole soon > wants to hold off on O2 at HS.     Review of Systems  Constitutional: Negative.  Negative for fever and unexpected weight change.  HENT: Positive for congestion and postnasal drip. Negative for dental problem, ear pain, nosebleeds, rhinorrhea, sinus pressure, sneezing, sore throat and trouble swallowing.   Eyes: Negative.  Negative for redness and itching.  Respiratory: Positive for chest tightness and shortness of breath. Negative for cough and wheezing.   Cardiovascular: Positive for leg swelling. Negative for palpitations.  Gastrointestinal: Negative.  Negative for nausea and vomiting.  Endocrine: Negative.   Genitourinary: Negative.  Negative for dysuria.  Musculoskeletal: Positive for arthralgias. Negative for joint swelling.  Skin: Negative for rash.  Allergic/Immunologic: Positive for  environmental allergies.  Neurological: Negative.  Negative for headaches.  Hematological: Bruises/bleeds easily.  Psychiatric/Behavioral: Negative.  Negative for dysphoric mood. The patient is not nervous/anxious.           Objective:   Physical Exam   Vitals:  Vitals:   05/23/16 1620  BP: 128/68  Pulse: 82  SpO2: 97%  Weight: 124 lb (56.2 kg)  Height: 5' (1.524 m)    Constitutional/General:  Pleasant, well-nourished, well-developed, not in any distress,  Comfortably seating.  Well kempt  Body mass index is 24.22 kg/m. Wt Readings from Last 3 Encounters:  05/23/16 124 lb (56.2 kg)  05/14/16 123 lb (55.8 kg)  02/13/16 123 lb (55.8 kg)    HEENT: Pupils equal and reactive to light and accommodation. Anicteric sclerae. Normal nasal mucosa.   No oral  lesions,  mouth clear,  oropharynx clear, no postnasal drip. (-) Oral thrush. No dental caries.  Airway - Mallampati class III  Neck: No masses. Midline trachea. No JVD, (-) LAD. (-) bruits appreciated.  Respiratory/Chest: Grossly normal chest. (-) deformity. (-) Accessory muscle use.  Symmetric expansion. (-) Tenderness on palpation.  Resonant on percussion.  Diminished BS on both lower lung zones. (-)rhonchi, crackles at bases. Some wheezing bilaterally.  (-) egophony  Cardiovascular: Regular rate and  rhythm, heart sounds normal, no murmur or gallops, no peripheral edema  Gastrointestinal:  Normal bowel sounds. Soft, non-tender. No hepatosplenomegaly.  (-)  masses.   Musculoskeletal:  Normal muscle tone. Normal gait. Has a cast in RLE 2/2 recent plantar fasciitis.   Extremities: Grossly normal. (-) clubbing, cyanosis.  (-) edema  Skin: (-) rash,lesions seen.   Neurological/Psychiatric : alert, oriented to time, place, person. Normal mood and affect          Assessment & Plan:  COPD (chronic obstructive pulmonary disease) ([place]) Pt with copd. 40PY, quit smoking in her 73s.  More sob since 09/2015. Not  at baseline.  CXR (11/2015) >> inc markings at bases PFT (02/2016)  FEV1  0.82 or 46%.   Plan : 1. Cont Breo. Neb meds were  time consuming (Pulmicort and duoneb).  2. Cont  Incruse 1 puff daily. She wants to see if she can wean off this med.  3. Hold off on neb meds. Has pulmicort and duoneb. May use duoneb q4 prn as well as alb MDI q4.  4. UTD with vaccine. Told daughter in law to ask re: PNA vaccine. Received flu shot this fall. 5. Will need alpha one today.  6. Will need  Pulm Rehab but she wants to hold off.  7. Had sig o2 desatn with ONO. She did NOT want to do PSG.  She wanted to hold off on O2 until 2018.  She will be in a doughnut hole soon and she is avoiding expenses.       Exertional dyspnea Recent sob since 09/2015 pna. Clinically improved and better. . May need cardiac w/u if she gets worse again.  Start pulm rehab once off doughnut whole. Maybe in 2018.    Return to clinic in 6 mos.      Monica Becton, MD 05/23/2016   5:19 PM Pulmonary and [place] Pager: [phone] Office: [phone], Fax: [phone]

## 2016-05-29 LAB — ALPHA-1 ANTITRYPSIN PHENOTYPE: A-1 Antitrypsin: 139 mg/dL (ref 83–199)

## 2016-05-30 DIAGNOSIS — M79671 Pain in right foot: Secondary | ICD-10-CM | POA: Diagnosis not present

## 2016-06-27 ENCOUNTER — Other Ambulatory Visit: Payer: Self-pay

## 2016-06-27 MED ORDER — FLUTICASONE FUROATE-VILANTEROL 200-25 MCG/INH IN AEPB: 1.0000 | INHALATION_SPRAY | Freq: Every day | RESPIRATORY_TRACT | 4 refills | Status: DC

## 2016-08-02 ENCOUNTER — Telehealth: Payer: Self-pay | Admitting: Pulmonary Disease

## 2016-08-02 MED ORDER — UMECLIDINIUM BROMIDE 62.5 MCG/INH IN AEPB: 1.0000 | INHALATION_SPRAY | Freq: Every day | RESPIRATORY_TRACT | 0 refills | Status: AC

## 2016-08-02 NOTE — Telephone Encounter (Signed)
Called and spoke with pt and she is aware of samples that have been left up front for the pt of the incruse.  Pt will have her daughter-in-law come and pick these up tomorrow.

## 2016-08-08 ENCOUNTER — Telehealth: Payer: Self-pay | Admitting: Pulmonary Disease

## 2016-08-08 MED ORDER — FLUTICASONE FUROATE-VILANTEROL 200-25 MCG/INH IN AEPB: 1.0000 | INHALATION_SPRAY | Freq: Every day | RESPIRATORY_TRACT | 0 refills | Status: DC

## 2016-08-08 NOTE — Telephone Encounter (Signed)
Called and spoke with pt and she is aware of sample that has been left up front and nothing further is needed.

## 2016-09-10 ENCOUNTER — Telehealth: Payer: Self-pay | Admitting: Internal Medicine

## 2016-09-10 NOTE — Telephone Encounter (Signed)
Patient Name: MITZEL MIRANTE DOB: 1941-04-29 Initial Comment Caller states toes on right foot turning blue and swelling Nurse Assessment Nurse: Ronnald Ramp, RN, Miranda Date/Time (Eastern Time): 09/10/2016 1:42:32 PM Confirm and document reason for call. If symptomatic, describe symptoms. ---Caller states her right toes have been turning red and blue off and on for several years. Now her toes on her left foot are doing it as well for the last month. Does the patient have any new or worsening symptoms? ---Yes Will a triage be completed? ---Yes Related visit to physician within the last 2 weeks? ---No Does the PT have any chronic conditions? (i.e. diabetes, asthma, etc.) ---Yes List chronic conditions. ---HTN, High Cholesterol Is this a behavioral health or substance abuse call? ---No Guidelines Guideline Title Affirmed Question Affirmed Notes Foot Pain Purple or black skin on foot or toe Final Disposition User Go to ED Now Ronnald Ramp, RN, [employer]

## 2016-09-10 NOTE — Telephone Encounter (Signed)
Pt called this morning regarding an issue with her toes that has been going on for a long time.  The scheduler sent her to [facility] health who then told her to go straight to the ER. Her dauther in law call in saying "she is freaking thinking she's going to die" 2 of her toes has been blue for quite some time and they have recently turned red.  A couple years ago she went and seen a podiatrist and they sent her to Dr. Gwenlyn Found.  She was actually wanting a referral and she is not sure what type of doctor she needs.  Please advise

## 2016-09-10 NOTE — Telephone Encounter (Signed)
Spoke to daughter in law and she is going to talk with pt and they will probably just monitor it and will call if it seems to get worse.

## 2016-09-10 NOTE — Telephone Encounter (Signed)
Recommend visit within 2 days if stable problem. If new or worsening problem recommend visit within 24 hours outpatient.

## 2016-09-11 ENCOUNTER — Telehealth: Payer: Self-pay

## 2016-09-11 NOTE — Telephone Encounter (Signed)
pts cologuard order has been cancelled due to inactivity. Cologuard was ordered on her 09-08-2015 office visit.

## 2016-09-11 NOTE — Telephone Encounter (Signed)
ok 

## 2016-09-14 ENCOUNTER — Ambulatory Visit (INDEPENDENT_AMBULATORY_CARE_PROVIDER_SITE_OTHER): Payer: Medicare HMO | Admitting: Internal Medicine

## 2016-09-14 VITALS — BP 120/62 | HR 94 | Temp 97.8°F | Resp 16 | Ht 60.0 in | Wt 119.0 lb

## 2016-09-14 DIAGNOSIS — I739 Peripheral vascular disease, unspecified: Secondary | ICD-10-CM | POA: Diagnosis not present

## 2016-09-14 DIAGNOSIS — M7989 Other specified soft tissue disorders: Secondary | ICD-10-CM | POA: Diagnosis not present

## 2016-09-14 NOTE — Patient Instructions (Signed)
We have given you the prescription for the compression stockings.   It is okay to take mucinex for the cold.

## 2016-09-14 NOTE — Progress Notes (Signed)
Pre visit review using our clinic review tool, if applicable. No additional management support is needed unless otherwise documented below in the visit note. 

## 2016-09-14 NOTE — Progress Notes (Signed)
   Subjective:    Patient ID: Sherry Lambert, female    DOB: 1940-09-05, 76 y.o.   MRN: [id number]  HPI The patient is a 76 YO female coming in for toes turning blue. Has been going on for some time and was worse in the last several weeks. She is not a smoker since 2004. She denies any calf pain with walking. She denies ulcer on her feet or pain in her feet. Sometimes the toes turn blue or red.   Review of Systems  Constitutional: Negative.   Respiratory: Negative.   Cardiovascular: Negative.   Gastrointestinal: Negative.   Musculoskeletal: Negative.   Skin: Positive for color change. Negative for pallor, rash and wound.  Neurological: Positive for numbness. Negative for dizziness, weakness and headaches.      Objective:   Physical Exam  Constitutional: She appears well-developed and well-nourished.  HENT:  Head: Normocephalic and atraumatic.  Eyes: EOM are normal.  Neck: Normal range of motion.  Cardiovascular: Normal rate and regular rhythm.   Pulmonary/Chest: Effort normal.  Abdominal: Soft.  Skin: Skin is warm and dry.  Toes on both feet with some toes blue, some red, some skin colored, no ulcer or skin breakdown either foot. DP and PT not palpable.   Vitals:   09/14/16 1327  BP: 120/62  Pulse: 94  Resp: 16  Temp: 97.8 F (36.6 C)  TempSrc: Oral  SpO2: 97%  Weight: 119 lb (54 kg)  Height: 5' (1.524 m)      Assessment & Plan:

## 2016-09-14 NOTE — Assessment & Plan Note (Addendum)
Without claudication. Offered vascular referral but doubt need for intervention at this time. No ulcers or wounds and likely long term smoking caused damage. ABI back in 2015 reviewed and with changes and poor peripheral circulation. Compression stockings for the sensation of swelling in her toes.

## 2016-10-04 DIAGNOSIS — M7541 Impingement syndrome of right shoulder: Secondary | ICD-10-CM | POA: Diagnosis not present

## 2016-10-04 DIAGNOSIS — M7501 Adhesive capsulitis of right shoulder: Secondary | ICD-10-CM | POA: Diagnosis not present

## 2016-10-16 ENCOUNTER — Telehealth: Payer: Self-pay

## 2016-10-16 MED ORDER — PRAVASTATIN SODIUM 20 MG PO TABS: 20.0000 mg | ORAL_TABLET | Freq: Every day | ORAL | 3 refills | Status: DC

## 2016-10-16 NOTE — Telephone Encounter (Signed)
Medication refill for pravastatin sent to pharmacy

## 2016-10-29 DIAGNOSIS — I739 Peripheral vascular disease, unspecified: Secondary | ICD-10-CM | POA: Diagnosis not present

## 2016-10-29 DIAGNOSIS — Z6823 Body mass index (BMI) 23.0-23.9, adult: Secondary | ICD-10-CM | POA: Diagnosis not present

## 2016-10-29 DIAGNOSIS — Z961 Presence of intraocular lens: Secondary | ICD-10-CM | POA: Diagnosis not present

## 2016-10-29 DIAGNOSIS — Z7951 Long term (current) use of inhaled steroids: Secondary | ICD-10-CM | POA: Diagnosis not present

## 2016-10-29 DIAGNOSIS — H9191 Unspecified hearing loss, right ear: Secondary | ICD-10-CM | POA: Diagnosis not present

## 2016-10-29 DIAGNOSIS — M25511 Pain in right shoulder: Secondary | ICD-10-CM | POA: Diagnosis not present

## 2016-10-29 DIAGNOSIS — Z9849 Cataract extraction status, unspecified eye: Secondary | ICD-10-CM | POA: Diagnosis not present

## 2016-10-29 DIAGNOSIS — Z87891 Personal history of nicotine dependence: Secondary | ICD-10-CM | POA: Diagnosis not present

## 2016-10-29 DIAGNOSIS — J449 Chronic obstructive pulmonary disease, unspecified: Secondary | ICD-10-CM | POA: Diagnosis not present

## 2016-10-29 DIAGNOSIS — Z7982 Long term (current) use of aspirin: Secondary | ICD-10-CM | POA: Diagnosis not present

## 2016-10-29 DIAGNOSIS — I10 Essential (primary) hypertension: Secondary | ICD-10-CM | POA: Diagnosis not present

## 2016-10-29 DIAGNOSIS — I69998 Other sequelae following unspecified cerebrovascular disease: Secondary | ICD-10-CM | POA: Diagnosis not present

## 2016-10-29 DIAGNOSIS — Z0000 Encounter for general adult medical examination without abnormal findings: Secondary | ICD-10-CM | POA: Diagnosis not present

## 2016-10-29 DIAGNOSIS — Z972 Presence of dental prosthetic device (complete) (partial): Secondary | ICD-10-CM | POA: Diagnosis not present

## 2016-10-29 DIAGNOSIS — Z599 Problem related to housing and economic circumstances, unspecified: Secondary | ICD-10-CM | POA: Diagnosis not present

## 2016-10-29 DIAGNOSIS — E785 Hyperlipidemia, unspecified: Secondary | ICD-10-CM | POA: Diagnosis not present

## 2016-10-29 DIAGNOSIS — R69 Illness, unspecified: Secondary | ICD-10-CM | POA: Diagnosis not present

## 2016-10-29 DIAGNOSIS — M722 Plantar fascial fibromatosis: Secondary | ICD-10-CM | POA: Diagnosis not present

## 2016-11-09 ENCOUNTER — Emergency Department (HOSPITAL_COMMUNITY): Payer: Medicare HMO

## 2016-11-09 ENCOUNTER — Encounter (HOSPITAL_COMMUNITY): Payer: Self-pay

## 2016-11-09 ENCOUNTER — Inpatient Hospital Stay (HOSPITAL_COMMUNITY)
Admission: EM | Admit: 2016-11-09 | Discharge: 2016-11-14 | DRG: 871 | Disposition: A | Discharge status: Home / Self Care | Payer: Medicare HMO | Attending: Internal Medicine | Admitting: Internal Medicine

## 2016-11-09 DIAGNOSIS — Z79899 Other long term (current) drug therapy: Secondary | ICD-10-CM

## 2016-11-09 DIAGNOSIS — R197 Diarrhea, unspecified: Secondary | ICD-10-CM | POA: Diagnosis present

## 2016-11-09 DIAGNOSIS — J189 Pneumonia, unspecified organism: Secondary | ICD-10-CM | POA: Diagnosis not present

## 2016-11-09 DIAGNOSIS — Z8249 Family history of ischemic heart disease and other diseases of the circulatory system: Secondary | ICD-10-CM

## 2016-11-09 DIAGNOSIS — Z90710 Acquired absence of both cervix and uterus: Secondary | ICD-10-CM | POA: Diagnosis not present

## 2016-11-09 DIAGNOSIS — J181 Lobar pneumonia, unspecified organism: Secondary | ICD-10-CM

## 2016-11-09 DIAGNOSIS — E1122 Type 2 diabetes mellitus with diabetic chronic kidney disease: Secondary | ICD-10-CM | POA: Diagnosis not present

## 2016-11-09 DIAGNOSIS — Z9889 Other specified postprocedural states: Secondary | ICD-10-CM

## 2016-11-09 DIAGNOSIS — I129 Hypertensive chronic kidney disease with stage 1 through stage 4 chronic kidney disease, or unspecified chronic kidney disease: Secondary | ICD-10-CM | POA: Diagnosis not present

## 2016-11-09 DIAGNOSIS — R0602 Shortness of breath: Secondary | ICD-10-CM | POA: Diagnosis not present

## 2016-11-09 DIAGNOSIS — I69351 Hemiplegia and hemiparesis following cerebral infarction affecting right dominant side: Secondary | ICD-10-CM

## 2016-11-09 DIAGNOSIS — A419 Sepsis, unspecified organism: Secondary | ICD-10-CM | POA: Diagnosis not present

## 2016-11-09 DIAGNOSIS — R6521 Severe sepsis with septic shock: Secondary | ICD-10-CM | POA: Diagnosis present

## 2016-11-09 DIAGNOSIS — I959 Hypotension, unspecified: Secondary | ICD-10-CM | POA: Diagnosis not present

## 2016-11-09 DIAGNOSIS — J90 Pleural effusion, not elsewhere classified: Secondary | ICD-10-CM | POA: Diagnosis not present

## 2016-11-09 DIAGNOSIS — E1151 Type 2 diabetes mellitus with diabetic peripheral angiopathy without gangrene: Secondary | ICD-10-CM | POA: Diagnosis not present

## 2016-11-09 DIAGNOSIS — Z87891 Personal history of nicotine dependence: Secondary | ICD-10-CM

## 2016-11-09 DIAGNOSIS — Z833 Family history of diabetes mellitus: Secondary | ICD-10-CM

## 2016-11-09 DIAGNOSIS — R079 Chest pain, unspecified: Secondary | ICD-10-CM | POA: Diagnosis not present

## 2016-11-09 DIAGNOSIS — Z800 Family history of malignant neoplasm of digestive organs: Secondary | ICD-10-CM | POA: Diagnosis not present

## 2016-11-09 DIAGNOSIS — N189 Chronic kidney disease, unspecified: Secondary | ICD-10-CM | POA: Diagnosis not present

## 2016-11-09 DIAGNOSIS — K219 Gastro-esophageal reflux disease without esophagitis: Secondary | ICD-10-CM | POA: Diagnosis not present

## 2016-11-09 DIAGNOSIS — J439 Emphysema, unspecified: Secondary | ICD-10-CM | POA: Diagnosis present

## 2016-11-09 LAB — BASIC METABOLIC PANEL
Anion gap: 11 (ref 5–15)
BUN: 21 mg/dL — ABNORMAL HIGH (ref 6–20)
CALCIUM: 9.9 mg/dL (ref 8.9–10.3)
CHLORIDE: 103 mmol/L (ref 101–111)
CO2: 21 mmol/L — AB (ref 22–32)
Creatinine, Ser: 1.17 mg/dL — ABNORMAL HIGH (ref 0.44–1.00)
GFR calc Af Amer: 52 mL/min — ABNORMAL LOW (ref >60)
GFR calc non Af Amer: 45 mL/min — ABNORMAL LOW (ref >60)
GLUCOSE: 130 mg/dL — AB (ref 65–99)
Potassium: 4.4 mmol/L (ref 3.5–5.1)
Sodium: 135 mmol/L (ref 135–145)

## 2016-11-09 LAB — CBC
HCT: 43.2 % (ref 36.0–46.0)
HEMATOCRIT: 37.4 % (ref 36.0–46.0)
Hemoglobin: 14 g/dL (ref 12.0–15.0)
Hemoglobin: 11.7 g/dL — ABNORMAL LOW (ref 12.0–15.0)
MCH: 29.2 pg (ref 26.0–34.0)
MCH: 28.5 pg (ref 26.0–34.0)
MCHC: 32.4 g/dL (ref 30.0–36.0)
MCHC: 31.3 g/dL (ref 30.0–36.0)
MCV: 90.2 fL (ref 78.0–100.0)
MCV: 91 fL (ref 78.0–100.0)
Platelets: 274 10*3/uL (ref 150–400)
Platelets: 245 10*3/uL (ref 150–400)
RBC: 4.79 MIL/uL (ref 3.87–5.11)
RBC: 4.11 MIL/uL (ref 3.87–5.11)
RDW: 13.9 % (ref 11.5–15.5)
RDW: 14.1 % (ref 11.5–15.5)
WBC: 23.7 10*3/uL — ABNORMAL HIGH (ref 4.0–10.5)
WBC: 30.9 10*3/uL — ABNORMAL HIGH (ref 4.0–10.5)

## 2016-11-09 LAB — CREATININE, SERUM
Creatinine, Ser: 1.27 mg/dL — ABNORMAL HIGH (ref 0.44–1.00)
GFR calc Af Amer: 47 mL/min — ABNORMAL LOW (ref >60)
GFR calc non Af Amer: 41 mL/min — ABNORMAL LOW (ref >60)

## 2016-11-09 LAB — I-STAT TROPONIN, ED
Troponin i, poc: 0 ng/mL (ref 0.00–0.08)
Troponin i, poc: 0 ng/mL (ref 0.00–0.08)

## 2016-11-09 LAB — GLUCOSE, CAPILLARY: Glucose-Capillary: 183 mg/dL — ABNORMAL HIGH (ref 65–99)

## 2016-11-09 LAB — I-STAT CG4 LACTIC ACID, ED
Lactic Acid, Venous: 2.13 mmol/L (ref 0.5–1.9)
Lactic Acid, Venous: 1.22 mmol/L (ref 0.5–1.9)
Lactic Acid, Venous: 1.53 mmol/L (ref 0.5–1.9)

## 2016-11-09 MED ORDER — DEXTROSE 5 % IV SOLN
0.0000 ug/min | INTRAVENOUS | Status: DC
  Administered 2016-11-09: 5 ug/min via INTRAVENOUS
  Filled 2016-11-09 (×2): qty 4

## 2016-11-09 MED ORDER — ASPIRIN EC 81 MG PO TBEC
81.0000 mg | DELAYED_RELEASE_TABLET | Freq: Every day | ORAL | Status: DC
  Administered 2016-11-10 – 2016-11-14 (×5): 81 mg via ORAL
  Filled 2016-11-09 (×5): qty 1

## 2016-11-09 MED ORDER — SODIUM CHLORIDE 0.9 % IV SOLN
1000.0000 mL | Freq: Once | INTRAVENOUS | Status: AC
  Administered 2016-11-09: 1000 mL via INTRAVENOUS

## 2016-11-09 MED ORDER — UMECLIDINIUM BROMIDE 62.5 MCG/INH IN AEPB
1.0000 | INHALATION_SPRAY | Freq: Every day | RESPIRATORY_TRACT | Status: DC
  Administered 2016-11-10 – 2016-11-14 (×5): 1 via RESPIRATORY_TRACT
  Filled 2016-11-09: qty 7

## 2016-11-09 MED ORDER — DEXTROSE 5 % IV SOLN
1.0000 g | INTRAVENOUS | Status: DC
  Administered 2016-11-10 – 2016-11-11 (×2): 1 g via INTRAVENOUS
  Filled 2016-11-09 (×4): qty 10

## 2016-11-09 MED ORDER — SODIUM CHLORIDE 0.9 % IV SOLN
INTRAVENOUS | Status: DC
  Administered 2016-11-09: 1000 mL/h via INTRAVENOUS

## 2016-11-09 MED ORDER — ALBUTEROL SULFATE (2.5 MG/3ML) 0.083% IN NEBU: 2.5000 mg | INHALATION_SOLUTION | Freq: Four times a day (QID) | RESPIRATORY_TRACT | Status: DC | PRN

## 2016-11-09 MED ORDER — ALBUTEROL SULFATE HFA 108 (90 BASE) MCG/ACT IN AERS: 2.0000 | INHALATION_SPRAY | Freq: Four times a day (QID) | RESPIRATORY_TRACT | Status: DC | PRN

## 2016-11-09 MED ORDER — IPRATROPIUM-ALBUTEROL 0.5-2.5 (3) MG/3ML IN SOLN: 3.0000 mL | RESPIRATORY_TRACT | Status: DC | PRN

## 2016-11-09 MED ORDER — SODIUM CHLORIDE 0.9 % IV BOLUS (SEPSIS)
500.0000 mL | Freq: Once | INTRAVENOUS | Status: AC
  Administered 2016-11-09: 500 mL via INTRAVENOUS

## 2016-11-09 MED ORDER — ENOXAPARIN SODIUM 30 MG/0.3ML ~~LOC~~ SOLN
30.0000 mg | SUBCUTANEOUS | Status: DC
  Administered 2016-11-09 – 2016-11-10 (×2): 30 mg via SUBCUTANEOUS
  Filled 2016-11-09 (×2): qty 0.3

## 2016-11-09 MED ORDER — SODIUM CHLORIDE 0.9 % IV BOLUS (SEPSIS)
250.0000 mL | Freq: Once | INTRAVENOUS | Status: AC
  Administered 2016-11-09: 250 mL via INTRAVENOUS

## 2016-11-09 MED ORDER — SODIUM CHLORIDE 0.9 % IV BOLUS (SEPSIS)
1000.0000 mL | Freq: Once | INTRAVENOUS | Status: AC
  Administered 2016-11-09: 1000 mL via INTRAVENOUS

## 2016-11-09 MED ORDER — FLUTICASONE FUROATE-VILANTEROL 200-25 MCG/INH IN AEPB
1.0000 | INHALATION_SPRAY | Freq: Every day | RESPIRATORY_TRACT | Status: DC
  Administered 2016-11-10 – 2016-11-14 (×5): 1 via RESPIRATORY_TRACT
  Filled 2016-11-09: qty 28

## 2016-11-09 MED ORDER — ATROPINE SULFATE 1 MG/10ML IJ SOSY
PREFILLED_SYRINGE | INTRAMUSCULAR | Status: AC
  Filled 2016-11-09: qty 10

## 2016-11-09 MED ORDER — DEXTROSE 5 % IV SOLN
500.0000 mg | Freq: Once | INTRAVENOUS | Status: AC
  Administered 2016-11-09: 500 mg via INTRAVENOUS
  Filled 2016-11-09: qty 500

## 2016-11-09 MED ORDER — ACETAMINOPHEN 325 MG PO TABS
650.0000 mg | ORAL_TABLET | Freq: Once | ORAL | Status: AC
  Administered 2016-11-09: 650 mg via ORAL
  Filled 2016-11-09: qty 2

## 2016-11-09 MED ORDER — DEXTROSE 5 % IV SOLN
500.0000 mg | INTRAVENOUS | Status: DC
  Administered 2016-11-10: 500 mg via INTRAVENOUS
  Filled 2016-11-09 (×3): qty 500

## 2016-11-09 MED ORDER — NOREPINEPHRINE BITARTRATE 1 MG/ML IV SOLN: 0.0000 ug/min | Freq: Once | INTRAVENOUS | Status: DC

## 2016-11-09 MED ORDER — SODIUM CHLORIDE 0.9 % IV SOLN: 250.0000 mL | INTRAVENOUS | Status: DC | PRN

## 2016-11-09 MED ORDER — DEXTROSE 5 % IV SOLN
1.0000 g | Freq: Once | INTRAVENOUS | Status: AC
  Administered 2016-11-09: 1 g via INTRAVENOUS
  Filled 2016-11-09: qty 10

## 2016-11-09 NOTE — ED Notes (Signed)
Pt now revealing left sided chest pain "just a small tinge" that started a month ago but is bothering her more today.

## 2016-11-09 NOTE — Progress Notes (Signed)
Pharmacy Antibiotic Note  Sherry Lambert is a 76 y.o. female admitted on 11/09/2016 with pneumonia.  Pharmacy has been consulted for azithromycin and ceftriaxone dosing.  On ED arrival, patient is afebrile with elevated WBC of 23.7.  Plan: Azithromycin 500mg  IV every 24 hours Ceftriaxone 1gm IV every 24 hours Monitor clinical progress  Temp (24hrs), Avg:98 F (36.7 C), Min:98 F (36.7 C), Max:98 F (36.7 C)   Recent Labs Lab 11/09/16 1453 11/09/16 1545  WBC 23.7*  --   CREATININE 1.17*  --   LATICACIDVEN  --  2.13*    CrCl cannot be calculated (Unknown ideal weight.).    Allergies  Allergen Reactions  . Lipitor [Atorvastatin] Itching    Antimicrobials this admission: 3/9 azith >>  3/9 ceftriaxone >>   Thank you for allowing pharmacy to be a part of this patient's care.  Demetrius Charity, PharmD Acute Care Pharmacy Resident  Pager: [phone] 11/09/2016

## 2016-11-09 NOTE — ED Triage Notes (Signed)
Pt reports SOB and having trouble catching her breath; onset 2 days ago and states she used her albuterol and inhaler and states she felt like it helped her a little. She also reports she has been feeling very weak and reports having diarrhea and constipation. A&OX4. Hx of stroke 2 years ago and deficits are just right arm weakness and loss and sensation.

## 2016-11-09 NOTE — H&P (Signed)
PULMONARY / CRITICAL CARE MEDICINE   Name: Sherry Lambert MRN: [id number] DOB: 04-13-1941    ADMISSION DATE:  11/09/2016 CONSULTATION DATE:  November 09, 2016  CHIEF COMPLAINT:  Dyspnea  HISTORY OF PRESENT ILLNESS:   [name] is a 76 y/o woman with a Hx of COPD (sees Dr. Corrie Dandy in clinic), Stroke (L hemisphere), as well as PAD who presents to the ED with a two day history of dyspnea, worsening R-sided weakness, and diarrhea. She endorsed some fatigue and malaise as well. She presented to the ED where she was found to have a right middle lobe PNA. She was persistently hypotensive, and was started on leveophed. PCCM was called for admission.  PAST MEDICAL HISTORY :  She  has a past medical history of [name]; Chronic kidney disease; Diabetes mellitus without complication ([place]); Emphysema of lung ([place]); GERD (gastroesophageal reflux disease); Hyperlipidemia; Hypertension; Peripheral arterial disease ([place]); [name]; and Stroke ([place]).  PAST SURGICAL HISTORY: She  has a past surgical history that includes Tonsillectomy; Abdominal hysterectomy; Colonoscopy; Esophagogastroduodenoscopy (N/A, 12/23/2012); Balloon dilation (N/A, 12/23/2012); Esophagogastroduodenoscopy (N/A, 01/15/2013); Balloon dilation (N/A, 01/15/2013); and cataract.  Allergies  Allergen Reactions  . Lipitor [Atorvastatin] Itching    No current facility-administered medications on file prior to encounter.    Current Outpatient Prescriptions on File Prior to Encounter  Medication Sig  . aspirin EC 81 MG tablet Take 1 tablet (81 mg total) by mouth daily.  . budesonide (PULMICORT) 0.5 MG/2ML nebulizer solution Take 2 mLs (0.5 mg total) by nebulization 2 (two) times daily.  . fluticasone furoate-vilanterol (BREO ELLIPTA) 200-25 MCG/INH AEPB Inhale 1 puff into the lungs daily.  Marland Kitchen ipratropium-albuterol (DUONEB) 0.5-2.5 (3) MG/3ML SOLN Take 3 mLs by nebulization 4 (four) times daily. And as needed (Patient taking  differently: Take 3 mLs by nebulization 4 (four) times daily. For shortness of breath)  . lisinopril-hydrochlorothiazide (ZESTORETIC) 20-12.5 MG tablet Take 1 tablet by mouth daily.  Marland Kitchen omeprazole (PRILOSEC) 40 MG capsule Take 1 capsule (40 mg total) by mouth 2 (two) times daily before a meal. Take before breakfast and before dinner  . pravastatin (PRAVACHOL) 20 MG tablet Take 1 tablet (20 mg total) by mouth daily.  Marland Kitchen PROAIR HFA 108 (90 Base) MCG/ACT inhaler Inhale 2 puffs into the lungs every 6 (six) hours as needed for shortness of breath.   . umeclidinium bromide (INCRUSE ELLIPTA) 62.5 MCG/INH AEPB Inhale 1 puff into the lungs daily.  . fluticasone furoate-vilanterol (BREO ELLIPTA) 200-25 MCG/INH AEPB Inhale 1 puff into the lungs daily. (Patient not taking: Reported on 11/09/2016)  . [DISCONTINUED] lisinopril (PRINIVIL,ZESTRIL) 20 MG tablet Take 1 tablet (20 mg total) by mouth daily. Must keep march appt for future refills (Patient taking differently: Take 20 mg by mouth every morning. Must keep march appt for future refills)    FAMILY HISTORY:  Her indicated that her mother is deceased. She indicated that her father is deceased. She indicated that the status of her sister is unknown. She indicated that the status of her neg hx is unknown.    SOCIAL HISTORY: She  reports that she quit smoking about 14 years ago. Her smoking use included Cigarettes. She quit after 55.00 years of use. She has never used smokeless tobacco. She reports that she does not drink alcohol or use drugs.  VITAL SIGNS: BP 106/73   Pulse (!) 53   Temp 98 F (36.7 C) (Oral)   Resp 20   SpO2 100%   HEMODYNAMICS:  levophed at 3mcg/min  VENTILATOR SETTINGS:    INTAKE / OUTPUT: No intake/output data recorded.  PHYSICAL EXAMINATION: General:  Eldelry woman in NAD Neuro:  Mild spasticity of R hemibody. HEENT:  MMM Cardiovascular:  Pulses full. Lungs:  Clear. Abdomen:  Soft. Musculoskeletal:  Intact Skin: Thin,  no visible ecchymosis or rash.  LABS:  BMET  Recent Labs Lab 11/09/16 1453  NA 135  K 4.4  CL 103  CO2 21*  BUN 21*  CREATININE 1.17*  GLUCOSE 130*    Electrolytes  Recent Labs Lab 11/09/16 1453  CALCIUM 9.9    CBC  Recent Labs Lab 11/09/16 1453  WBC 23.7*  HGB 14.0  HCT 43.2  PLT 274    Coag's No results for input(s): APTT, INR in the last 168 hours.  Sepsis Markers  Recent Labs Lab 11/09/16 1545 11/09/16 1840 11/09/16 1853  LATICACIDVEN 2.13* 1.22 1.53    ABG No results for input(s): PHART, PCO2ART, PO2ART in the last 168 hours.  Liver Enzymes No results for input(s): AST, ALT, ALKPHOS, BILITOT, ALBUMIN in the last 168 hours.  Cardiac Enzymes No results for input(s): TROPONINI, PROBNP in the last 168 hours.  Glucose No results for input(s): GLUCAP in the last 168 hours.  Imaging Dg Chest 2 View  Result Date: 11/09/2016 CLINICAL DATA:  Chest pain and [name] EXAM: CHEST  2 VIEW COMPARISON:  11/26/2015 FINDINGS: Cardiac shadow is within normal limits. A hiatal hernia is again identified and stable. The lungs are well aerated bilaterally. Right middle lobe infiltrate is noted best seen on the lateral projection. No sizable effusion is seen. No bony abnormality is noted. IMPRESSION: Right middle lobe infiltrate. Electronically Signed   By: Inez Catalina M.D.   On: 11/09/2016 15:59    STUDIES:  CXR showing RML infiltrate.  CULTURES: Urine (not yet collected) Blood 11/09/16 >> In process Sputum (not yet collected)  ANTIBIOTICS: Ceftriaxone 3/9 >> Azithromycin 3/9 >>  SIGNIFICANT EVENTS: -  LINES/TUBES: PIVs  DISCUSSION: 76 y/o woman with COPD p/w diarrhea and CAP  ASSESSMENT / PLAN:  PULMONARY A: RML CAP COPD, GOLD stage IV P:   CAP coverage Sputum cx Poss Legionella given diarrhea, f/u Legionella/Strep ag.  CARDIOVASCULAR A:  Hypotension (shock) P:  Given 1773mL of fluids, will give add'l 2L given  diarrhea. Titrate levophed as needed.  Hold off on CVC for now.  RENAL A:   AKI P:   Worsening, will attempt add'l fluid challenge.  GASTROINTESTINAL A:   Diarrhea P:   F/u legionella If persists, pursue diarrhea w/u W/ rising WBC, consider C. Diff eval, but hold off w/u for now.  HEMATOLOGIC A:   Worsening leukocytosis P:  Due to sepsis  INFECTIOUS A:   Septic Shock P:   PNA as source, possible infectious diarrhea. Tx as above.  ENDOCRINE A:   Hyperglycemia P:   ICU SSI  NEUROLOGIC A:   NO active concerns. P:   RASS goal: 0  FAMILY  - Updates: Updated on admission  - Inter-disciplinary family meet or Palliative Care meeting due by:  11/16/17  CRITICAL CARE Performed by: Luz Brazen   Total critical care time: 50 minutes  Critical care time was exclusive of separately billable procedures and treating other patients.  Critical care was necessary to treat or prevent imminent or life-threatening deterioration.  Critical care was time spent personally by me on the following activities: development of treatment plan with patient and/or surrogate as well as nursing, discussions with  consultants, evaluation of patient's response to treatment, examination of patient, obtaining history from patient or surrogate, ordering and performing treatments and interventions, ordering and review of laboratory studies, ordering and review of radiographic studies, pulse oximetry and re-evaluation of patient's condition.  Luz Brazen, MD Pulmonary and [place] Pager: [phone]  11/09/2016, 8:53 PM

## 2016-11-09 NOTE — ED Provider Notes (Signed)
[facility] DEPT Provider Note   CSN: [id number] Arrival date & time: 11/09/16  1440     History   Chief Complaint Chief Complaint  Patient presents with  . [name]  . Weakness    HPI Sherry Lambert is a 76 y.o. female.  Patient is a 76 year old female with a history of COPD, hypertension, peripheral artery disease, diabetes presenting today with 2 weeks of intermittent loose stool but 5 days of generalized weakness, exertional dyspnea and not feeling well. Today she felt worse and was presyncopal with standing she's had no appetite and her son brought her to the hospital. Patient has taken her medications this morning including her blood pressure medication. She denies any recent sick contacts, antibiotic use in the last 2 months, recent hospitalizations or travel. There is been no blood in her stool. She denies any abdominal pain or urinary symptoms.   The history is provided by the patient.  [name]  This is a new problem. The average episode lasts 5 days. The problem occurs frequently.Episode onset: 5 days. The problem has been gradually worsening. Associated symptoms include chest pain. Pertinent negatives include no fever, no sputum production, no wheezing, no vomiting, no abdominal pain and no leg swelling. Associated symptoms comments: Generalized weakness, diarrhea intermittently for the last 2 weeks.  No abd pain.  Denies any significant cough but decreased po intake.  Pt states intermittently her left chest will get sore. She has tried nothing for the symptoms. The treatment provided no relief. Associated medical issues include COPD and pneumonia. Associated medical issues do not include CAD, heart failure, past MI or DVT.  Weakness  Associated symptoms include shortness of breath and chest pain. Pertinent negatives include no vomiting.    Past Medical History:  Diagnosis Date  . [name]   . Chronic kidney disease    kidney stone  .  Diabetes mellitus without complication ([place])    no meds taken  . Emphysema of lung ([place])   . GERD (gastroesophageal reflux disease)   . Hyperlipidemia   . Hypertension   . Peripheral arterial disease ([place])   . [name]   . Stroke [facility])    November 2016- Thanksgiving night    Patient Active Problem List   Diagnosis Date Noted  . PVD (peripheral vascular disease) ([place]) 09/14/2016  . COPD (chronic obstructive pulmonary disease) ([place]) 12/01/2015  . Exertional dyspnea 12/01/2015  . Right hip pain 10/25/2015  . Nontraumatic subcortical hemorrhage of left cerebral hemisphere ([place]) 07/04/2015  . Essential hypertension 12/20/2014  . Intracerebral hemorrhage ([place]) 10/26/2014  . Routine general medical examination at a health care facility 10/22/2014  . 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 Peripheral arterial disease ([place]) 01/05/2014  . Osteopenia 11/12/2013  . Dyslipidemia 09/19/2013  . Diabetes mellitus type 2, controlled, 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[place]) 19/62/2297  . Other emphysema ([place]) 11/10/2012    Past Surgical History:  Procedure Laterality Date  . ABDOMINAL HYSTERECTOMY    . BALLOON DILATION N/A 12/23/2012   Procedure: BALLOON DILATION;  Surgeon: Inda Castle, MD;  Location: [facility];  Service: Endoscopy;  Laterality: N/A;  . BALLOON DILATION N/A 01/15/2013   Procedure: BALLOON DILATION;  Surgeon: Inda Castle, MD;  Location: [facility] ENDOSCOPY;  Service: Endoscopy;  Laterality: N/A;  . cataract     both eyes  . COLONOSCOPY    . ESOPHAGOGASTRODUODENOSCOPY N/A 12/23/2012   Procedure: ESOPHAGOGASTRODUODENOSCOPY (EGD);  Surgeon: Inda Castle, MD;  Location: [facility] ENDOSCOPY;  Service: Endoscopy;  Laterality: N/A;  . ESOPHAGOGASTRODUODENOSCOPY N/A 01/15/2013   Procedure: ESOPHAGOGASTRODUODENOSCOPY (EGD);  Surgeon: Inda Castle, MD;  Location: [facility];  Service: Endoscopy;  Laterality: N/A;  . TONSILLECTOMY      OB  History    No data available       Home Medications    Prior to Admission medications   Medication Sig Start Date End Date Taking? Authorizing Provider  aspirin EC 81 MG tablet Take 1 tablet (81 mg total) by mouth daily. 12/20/14   Rosalin Hawking, MD  budesonide (PULMICORT) 0.5 MG/2ML nebulizer solution Take 2 mLs (0.5 mg total) by nebulization 2 (two) times daily. 02/07/16   Jose Shirl Harris, MD  Dextromethorphan-Guaifenesin (CORICIDIN HBP CONGESTION/COUGH) 10-200 MG CAPS Take 1 capsule by mouth daily as needed (for congestion). Reported on 02/10/2016    Historical Provider, MD  fluticasone furoate-vilanterol (BREO ELLIPTA) 200-25 MCG/INH AEPB Inhale 1 puff into the lungs daily. 06/27/16   Jose Angelo A de Dios, MD  fluticasone furoate-vilanterol (BREO ELLIPTA) 200-25 MCG/INH AEPB Inhale 1 puff into the lungs daily. 08/08/16   Jose Angelo A de Dios, MD  ipratropium-albuterol (DUONEB) 0.5-2.5 (3) MG/3ML SOLN Take 3 mLs by nebulization 4 (four) times daily. And as needed 02/21/16   Rush Landmark, MD  lisinopril-hydrochlorothiazide (ZESTORETIC) 20-12.5 MG tablet Take 1 tablet by mouth daily. 02/10/16   [name], MD  omeprazole (PRILOSEC) 40 MG capsule Take 1 capsule (40 mg total) by mouth 2 (two) times daily before a meal. Take before breakfast and before dinner 10/20/15   Mauri Pole, MD  pravastatin (PRAVACHOL) 20 MG tablet Take 1 tablet (20 mg total) by mouth daily. 10/16/16   [name], MD  PROAIR HFA 108 [phone] Base) MCG/ACT inhaler Inhale 2 puffs into the lungs every 6 (six) hours as needed for shortness of breath.  09/22/15   Historical Provider, MD  umeclidinium bromide (INCRUSE ELLIPTA) 62.5 MCG/INH AEPB Inhale 1 puff into the lungs daily. 02/13/16   Jose Shirl Harris, MD    Family History Family History  Problem Relation Age of Onset  . Hypertension Father   . Diabetes Father   . Colon cancer Sister   . Cancer Neg Hx   . Early death Neg Hx   . Stroke  Neg Hx   . Esophageal cancer Neg Hx   . Rectal cancer Neg Hx   . Stomach cancer Neg Hx     Social History Social History  Substance Use Topics  . Smoking status: Former Smoker    Years: 55.00    Types: Cigarettes    Quit date: 10/19/2002  . Smokeless tobacco: Never Used  . Alcohol use No     Allergies   Lipitor [atorvastatin]   Review of Systems Review of Systems  Constitutional: Negative for fever.  Respiratory: Positive for shortness of breath. Negative for sputum production and wheezing.   Cardiovascular: Positive for chest pain. Negative for leg swelling.  Gastrointestinal: Negative for abdominal pain and vomiting.  Neurological: Positive for weakness.  All other systems reviewed and are negative.    Physical Exam Updated Vital Signs BP (!) 102/53 (BP Location: Left Arm)   Pulse 97   Temp 98 F (36.7 C) (Oral)   Resp 20   SpO2 94%   Physical Exam  Constitutional: She is oriented to person, place, and time. She appears well-developed and well-nourished. No distress.  HENT:  Head: Normocephalic and atraumatic.  Mouth/Throat: Oropharynx is clear and moist. Mucous membranes are dry.  Eyes: Conjunctivae and EOM are normal. Pupils are equal, round, and reactive to light.  Neck: Normal range of motion. Neck supple.  Cardiovascular: Regular rhythm and intact distal pulses.  Tachycardia present.   No murmur heard. Pulmonary/Chest: Effort normal. No respiratory distress. She has no wheezes. She has rhonchi in the right middle field and the right lower field. She has no rales.  Abdominal: Soft. She exhibits no distension. There is no tenderness. There is no rebound and no guarding.  Musculoskeletal: Normal range of motion. She exhibits no edema or tenderness.  Neurological: She is alert and oriented to person, place, and time.  Skin: Skin is warm and dry. No rash noted. No erythema. There is pallor.  Psychiatric: She has a normal mood and affect. Her behavior is  normal.  Nursing note and vitals reviewed.    ED Treatments / Results  Labs (all labs ordered are listed, but only abnormal results are displayed) Labs Reviewed  BASIC METABOLIC PANEL - Abnormal; Notable for the following:       Result Value   CO2 21 (*)    Glucose, Bld 130 (*)    BUN 21 (*)    Creatinine, Ser 1.17 (*)    GFR calc non Af Amer 45 (*)    GFR calc Af Amer 52 (*)    All other components within normal limits  CBC - Abnormal; Notable for the following:    WBC 23.7 (*)    All other components within normal limits  I-STAT CG4 LACTIC ACID, ED - Abnormal; Notable for the following:    Lactic Acid, Venous 2.13 (*)    All other components within normal limits  CULTURE, BLOOD (ROUTINE X 2)  CULTURE, BLOOD (ROUTINE X 2)  URINE CULTURE  URINALYSIS, ROUTINE W REFLEX MICROSCOPIC  I-STAT TROPOININ, ED  I-STAT TROPOININ, ED  I-STAT CG4 LACTIC ACID, ED  I-STAT CG4 LACTIC ACID, ED    EKG  EKG Interpretation  Date/Time:  Friday November 09 2016 14:47:49 EST Ventricular Rate:  129 PR Interval:  142 QRS Duration: 64 QT Interval:  294 QTC Calculation: 430 R Axis:   60 Text Interpretation:  Sinus tachycardia with occasional and consecutive Premature ventricular complexes Artifact Confirmed by Maryan Rued  MD, Loree Fee ([id number]) on 11/09/2016 4:21:03 PM       Radiology Dg Chest 2 View  Result Date: 11/09/2016 CLINICAL DATA:  Chest pain and [name] EXAM: CHEST  2 VIEW COMPARISON:  11/26/2015 FINDINGS: Cardiac shadow is within normal limits. A hiatal hernia is again identified and stable. The lungs are well aerated bilaterally. Right middle lobe infiltrate is noted best seen on the lateral projection. No sizable effusion is seen. No bony abnormality is noted. IMPRESSION: Right middle lobe infiltrate. Electronically Signed   By: Inez Catalina M.D.   On: 11/09/2016 15:59    Procedures Procedures (including critical care time)  Medications Ordered in ED Medications  sodium  chloride 0.9 % bolus 1,000 mL (1,000 mLs Intravenous New Bag/Given 11/09/16 1655)    And  sodium chloride 0.9 % bolus 500 mL (not administered)    And  sodium chloride 0.9 % bolus 250 mL (not administered)  cefTRIAXone (ROCEPHIN) 1 g in dextrose 5 % 50 mL IVPB (not administered)  azithromycin (ZITHROMAX) 500 mg in dextrose 5 % 250 mL IVPB (not administered)  acetaminophen (TYLENOL) tablet 650 mg (650 mg Oral Given 11/09/16 1657)  Initial Impression / Assessment and Plan / ED Course  I have reviewed the triage vital signs and the nursing notes.  Pertinent labs & imaging results that were available during my care of the patient were reviewed by me and considered in my medical decision making (see chart for details).     Patient is a 76 year old female presenting as a code sepsis. She has had 5 days of worsening shortness of breath, generalized weakness but denies significant cough. She has also had intermittent diarrhea for the last 2 weeks. Patient initially was tachycardic and soft blood pressures. Oxygen saturation 94 and greater. No altered mental status at this time. Patient denies any bloody stools and she has no abdominal pain. She denies urinary symptoms. Labs are consistent with a leukocytosis of 23,000, mild AK AI with creatinine of 1.17, mildly elevated lactate of 2.13 and chest x-ray showing a right middle lobe pneumonia.  Patient given 30 per low bolus of fluid and will reevaluate. Also given Tylenol for fever. Patient did admit to taking her blood pressure medication this morning. She was also covered for community-acquired pneumonia with azithromycin and Rocephin. Cultures obtained.  Patient will require admission however will monitor to ensure her blood pressure remained stable after fluid bolus.  6:50 PM  Repeat lactate is improved however patient's blood pressure has not significantly improved. The pressures remain 80s over 40s. Maps in the 83s. Patient was started on levo fed.  She remains awake and able to speak in no acute distress at this time. We will discuss with pulmonary critical care.  CRITICAL CARE Performed by: Blanchie Dessert Total critical care time: 30 minutes Critical care time was exclusive of separately billable procedures and treating other patients. Critical care was necessary to treat or prevent imminent or life-threatening deterioration. Critical care was time spent personally by me on the following activities: development of treatment plan with patient and/or surrogate as well as nursing, discussions with consultants, evaluation of patient's response to treatment, examination of patient, obtaining history from patient or surrogate, ordering and performing treatments and interventions, ordering and review of laboratory studies, ordering and review of radiographic studies, pulse oximetry and re-evaluation of patient's condition.    Final Clinical Impressions(s) / ED Diagnoses   Final diagnoses:  Community acquired pneumonia of right middle lobe of lung ([place])  Sepsis, due to unspecified organism [facility])    New Prescriptions New Prescriptions   No medications on file     Blanchie Dessert, MD 11/09/16 1905

## 2016-11-09 NOTE — ED Notes (Signed)
Patient assisted to the bedside commode.  Denies dizziness with standing.  Patient attempting to urinate at this time.

## 2016-11-09 NOTE — ED Notes (Signed)
MD at bedside. 

## 2016-11-09 NOTE — Progress Notes (Signed)
eLink Physician-Brief Progress Note Patient Name: Sherry Lambert DOB: 12-Oct-1940 MRN: [id number]   Date of Service  11/09/2016  HPI/Events of Note  Patient with poor uop and remains on levophed.  Patient having trouble urinating.  eICU Interventions  Insert foley cath. Bolus 1 liter NS     Intervention Category Intermediate Interventions: Hypotension - evaluation and management  [name], [name], P 11/09/2016, 11:56 PM

## 2016-11-09 NOTE — ED Notes (Signed)
Admitting at bedside 

## 2016-11-09 NOTE — ED Notes (Signed)
Patient placed on 2LNC per MD

## 2016-11-09 NOTE — ED Notes (Signed)
Code Sepsis activated/RN NOTIFIED

## 2016-11-10 DIAGNOSIS — J181 Lobar pneumonia, unspecified organism: Secondary | ICD-10-CM

## 2016-11-10 LAB — CBC
HEMATOCRIT: 32.7 % — AB (ref 36.0–46.0)
HEMOGLOBIN: 10.3 g/dL — AB (ref 12.0–15.0)
MCH: 28.7 pg (ref 26.0–34.0)
MCHC: 31.5 g/dL (ref 30.0–36.0)
MCV: 91.1 fL (ref 78.0–100.0)
Platelets: 179 10*3/uL (ref 150–400)
RBC: 3.59 MIL/uL — AB (ref 3.87–5.11)
RDW: 14.3 % (ref 11.5–15.5)
WBC: 14.8 10*3/uL — ABNORMAL HIGH (ref 4.0–10.5)

## 2016-11-10 LAB — BASIC METABOLIC PANEL
Anion gap: 6 (ref 5–15)
BUN: 17 mg/dL (ref 6–20)
CHLORIDE: 115 mmol/L — AB (ref 101–111)
CO2: 20 mmol/L — AB (ref 22–32)
CREATININE: 1.02 mg/dL — AB (ref 0.44–1.00)
Calcium: 7.3 mg/dL — ABNORMAL LOW (ref 8.9–10.3)
GFR calc Af Amer: >60 mL/min (ref >60)
GFR calc non Af Amer: 53 mL/min — ABNORMAL LOW (ref >60)
GLUCOSE: 106 mg/dL — AB (ref 65–99)
POTASSIUM: 4.5 mmol/L (ref 3.5–5.1)
SODIUM: 141 mmol/L (ref 135–145)

## 2016-11-10 LAB — EXPECTORATED SPUTUM ASSESSMENT W GRAM STAIN, RFLX TO RESP C

## 2016-11-10 LAB — GLUCOSE, CAPILLARY
GLUCOSE-CAPILLARY: 82 mg/dL (ref 65–99)
GLUCOSE-CAPILLARY: 82 mg/dL (ref 65–99)
GLUCOSE-CAPILLARY: 99 mg/dL (ref 65–99)

## 2016-11-10 LAB — EXPECTORATED SPUTUM ASSESSMENT W REFEX TO RESP CULTURE

## 2016-11-10 LAB — MRSA PCR SCREENING: MRSA BY PCR: NEGATIVE

## 2016-11-10 MED ORDER — SODIUM CHLORIDE 0.9 % IV BOLUS (SEPSIS): 1000.0000 mL | Freq: Once | INTRAVENOUS | Status: DC

## 2016-11-10 MED ORDER — ACETAMINOPHEN 325 MG PO TABS
650.0000 mg | ORAL_TABLET | Freq: Four times a day (QID) | ORAL | Status: DC | PRN
  Administered 2016-11-10: 650 mg via ORAL
  Filled 2016-11-10 (×2): qty 2

## 2016-11-10 MED ORDER — SODIUM CHLORIDE 0.9 % IV SOLN
INTRAVENOUS | Status: AC
  Administered 2016-11-10: 100 mL/h via INTRAVENOUS

## 2016-11-10 MED ORDER — PANTOPRAZOLE SODIUM 40 MG IV SOLR: 40.0000 mg | Freq: Once | INTRAVENOUS | Status: DC

## 2016-11-10 MED ORDER — SODIUM CHLORIDE 0.9 % IV SOLN: INTRAVENOUS | Status: DC

## 2016-11-10 MED ORDER — PRAVASTATIN SODIUM 20 MG PO TABS
20.0000 mg | ORAL_TABLET | Freq: Every day | ORAL | Status: DC
  Administered 2016-11-10 – 2016-11-14 (×5): 20 mg via ORAL
  Filled 2016-11-10 (×5): qty 1

## 2016-11-10 NOTE — Progress Notes (Signed)
NURSING PROGRESS NOTE  Sherry Lambert [id number] Transfer Data: 11/10/2016 8:00 PM Attending Provider: Raylene Miyamoto, MD TDS:[id number] Becky [place], MD Code Status: FULL   Sherry Lambert is a 76 y.o. female patient transferred from 2 M  -No acute distress noted.  -No complaints of shortness of breath.  -No complaints of chest pain.   Last Documented Vital Signs: Blood pressure (!) 99/40, pulse 92, temperature 98.3 F (36.8 C), temperature source Oral, resp. rate 18, height 5' (1.524 m), weight 58.8 kg (129 lb 11.2 oz), SpO2 100 %.  IV Fluids:  IV in place, occlusive dsg intact without redness, IV cath forearm left, condition patent and no redness and antecubital right, condition patent and no redness IV push only, no IV fluids.   Allergies:  Lipitor [atorvastatin]  Past Medical History:   has a past medical history of [name]; Chronic kidney disease; Diabetes mellitus without complication ([place]); Emphysema of lung ([place]); GERD (gastroesophageal reflux disease); Hyperlipidemia; Hypertension; Peripheral arterial disease ([place]); [name]; and Stroke ([place]).  Past Surgical History:   has a past surgical history that includes Tonsillectomy; Abdominal hysterectomy; Colonoscopy; Esophagogastroduodenoscopy (N/A, 12/23/2012); Balloon dilation (N/A, 12/23/2012); Esophagogastroduodenoscopy (N/A, 01/15/2013); Balloon dilation (N/A, 01/15/2013); and cataract.  Social History:   reports that she quit smoking about 14 years ago. Her smoking use included Cigarettes. She quit after 55.00 years of use. She has never used smokeless tobacco. She reports that she does not drink alcohol or use drugs.  Skin: intact except where otherwise charted.   Patient/Family orientated to room. Information packet given to patient/family. Admission inpatient armband information verified with patient/family to include name and date of birth and placed on patient arm. Side rails up x 2, fall assessment and  education completed with patient/family. Patient/family able to verbalize understanding of risk associated with falls and verbalized understanding to call for assistance before getting out of bed. Call light within reach. Patient/family able to voice and demonstrate understanding of unit orientation instructions.

## 2016-11-10 NOTE — Progress Notes (Signed)
PCCM Progress Note  Admission date: 11/09/2016 Referring provider: Dr. Maryan Rued, ER  CC: short of breath  HPI: 76 yo female with dyspnea, diarrhea, fatigue, malaise.  Found to have pneumonia with sepsis.  Followed by Dr. Corrie Dandy for COPD, and has hx of CVA with Rt sided weakness.  Subjective: Has cough with some sputum.  Vital signs: BP (!) 101/50   Pulse 75   Temp 99 F (37.2 C) (Oral)   Resp 17   Ht 5' (1.524 m)   Wt 127 lb 10.3 oz (57.9 kg)   SpO2 100%   BMI 24.93 kg/m   Intake/output: I/O last 3 completed shifts: In: 2755.1 [I.V.:1755.1; IV Piggyback:1000] Out: 1000 [Urine:1000]  General: pleasant Neuro: follows commands HEENT: no stridor Cardiac: regular, no murmur Chest: crackles Rt mid lung, no wheeze Abd: soft, non tender Ext: no edema Skin: no rashes   CMP Latest Ref Rng & Units 11/10/2016 11/09/2016 11/09/2016  Glucose 65 - 99 mg/dL 106(H) - 130(H)  BUN 6 - 20 mg/dL 17 - 21(H)  Creatinine 0.44 - 1.00 mg/dL 1.02(H) 1.27(H) 1.17(H)  Sodium 135 - 145 mmol/L 141 - 135  Potassium 3.5 - 5.1 mmol/L 4.5 - 4.4  Chloride 101 - 111 mmol/L 115(H) - 103  CO2 22 - 32 mmol/L 20(L) - 21(L)  Calcium 8.9 - 10.3 mg/dL 7.3(L) - 9.9  Total Protein 6.0 - 8.3 g/dL - - -  Total Bilirubin 0.2 - 1.2 mg/dL - - -  Alkaline Phos 39 - 117 U/L - - -  AST 0 - 37 U/L - - -  ALT 0 - 35 U/L - - -     CBC Latest Ref Rng & Units 11/10/2016 11/09/2016 11/09/2016  WBC 4.0 - 10.5 K/uL 14.8(H) 30.9(H) 23.7(H)  Hemoglobin 12.0 - 15.0 g/dL 10.3(L) 11.7(L) 14.0  Hematocrit 36.0 - 46.0 % 32.7(L) 37.4 43.2  Platelets 150 - 400 K/uL 179 245 274     ABG    Component Value Date/Time   TCO2 28 11/26/2015 2230     CBG (last 3)   Recent Labs  11/10/16 0400 11/10/16 0844 11/10/16 1237  GLUCAP 82 82 99     Imaging: Dg Chest 2 View  Result Date: 11/09/2016 CLINICAL DATA:  Chest pain and [name] EXAM: CHEST  2 VIEW COMPARISON:  11/26/2015 FINDINGS: Cardiac shadow is within  normal limits. A hiatal hernia is again identified and stable. The lungs are well aerated bilaterally. Right middle lobe infiltrate is noted best seen on the lateral projection. No sizable effusion is seen. No bony abnormality is noted. IMPRESSION: Right middle lobe infiltrate. Electronically Signed   By: Inez Catalina M.D.   On: 11/09/2016 15:59     Studies: PFT 02/13/16 >>  0.94 (53%), FEV1% 47, DLCO 51%   Antibiotics: Rocephin 3/10 >> Zithromax 3/10 >>  Cultures: Blood 3/10 >> Sputum 3/10 >> Urine 3/10 >>  Events: 3/09 Admit 3/10 Off pressors, transfer to floor bed  Assessment/plan:  Sepsis 2nd to CAP. - day 2 of Abx - continue IV fluids - f/u CXR 3/12  COPD with emphysema. - continue breo, incruse  Hx of HTN, HLD, CVA. - continue ASA  Transfer to floor bed.  Chesley Mires, MD [facility] Pulmonary/Critical Care 11/10/2016, 12:55 PM Pager:  [phone] After 3pm call: [phone]

## 2016-11-11 LAB — CBC
HCT: 32.9 % — ABNORMAL LOW (ref 36.0–46.0)
HEMOGLOBIN: 10.5 g/dL — AB (ref 12.0–15.0)
MCH: 29.6 pg (ref 26.0–34.0)
MCHC: 31.9 g/dL (ref 30.0–36.0)
MCV: 92.7 fL (ref 78.0–100.0)
PLATELETS: 155 10*3/uL (ref 150–400)
RBC: 3.55 MIL/uL — AB (ref 3.87–5.11)
RDW: 14.7 % (ref 11.5–15.5)
WBC: 10.1 10*3/uL (ref 4.0–10.5)

## 2016-11-11 LAB — BASIC METABOLIC PANEL
ANION GAP: 4 — AB (ref 5–15)
BUN: 12 mg/dL (ref 6–20)
CALCIUM: 8.2 mg/dL — AB (ref 8.9–10.3)
CHLORIDE: 117 mmol/L — AB (ref 101–111)
CO2: 19 mmol/L — AB (ref 22–32)
Creatinine, Ser: 0.87 mg/dL (ref 0.44–1.00)
GFR calc non Af Amer: >60 mL/min (ref >60)
Glucose, Bld: 86 mg/dL (ref 65–99)
Potassium: 4.1 mmol/L (ref 3.5–5.1)
SODIUM: 140 mmol/L (ref 135–145)

## 2016-11-11 MED ORDER — AZITHROMYCIN 500 MG PO TABS
500.0000 mg | ORAL_TABLET | Freq: Every day | ORAL | Status: DC
  Administered 2016-11-11: 500 mg via ORAL
  Filled 2016-11-11: qty 1

## 2016-11-11 MED ORDER — ENOXAPARIN SODIUM 40 MG/0.4ML ~~LOC~~ SOLN
40.0000 mg | SUBCUTANEOUS | Status: DC
  Administered 2016-11-11 – 2016-11-13 (×3): 40 mg via SUBCUTANEOUS
  Filled 2016-11-11 (×3): qty 0.4

## 2016-11-11 MED ORDER — IBUPROFEN 400 MG PO TABS
400.0000 mg | ORAL_TABLET | Freq: Four times a day (QID) | ORAL | Status: DC | PRN
  Administered 2016-11-11 – 2016-11-12 (×2): 400 mg via ORAL
  Filled 2016-11-11 (×2): qty 1

## 2016-11-11 NOTE — Progress Notes (Signed)
PCCM Progress Note  Admission date: 11/09/2016 Referring provider: Dr. Maryan Rued, ER  CC: short of breath  HPI: 76 yo female with dyspnea, diarrhea, fatigue, malaise.  Found to have pneumonia with sepsis.  Followed by Dr. Corrie Dandy for COPD, and has hx of CVA with Rt sided weakness.  Subjective: Still gets winded with walking, but otherwise feels better.  Vital signs: BP (!) 104/46 (BP Location: Left Arm)   Pulse 81   Temp 97.8 F (36.6 C) (Oral)   Resp 18   Ht 5' (1.524 m)   Wt 131 lb 6.4 oz (59.6 kg)   SpO2 100%   BMI 25.66 kg/m   Intake/output: I/O last 3 completed shifts: In: 3755.1 [I.V.:2455.1; IV Piggyback:1300] Out: 2561 [Urine:2560; Stool:1]  General: pleasant Neuro: follows commands HEENT: no stridor Cardiac: regular Chest: better air movement, faint crackles on Rt Abd: soft, non tender Ext: no edema Skin: no rashes   CMP Latest Ref Rng & Units 11/11/2016 11/10/2016 11/09/2016  Glucose 65 - 99 mg/dL 86 106(H) -  BUN 6 - 20 mg/dL 12 17 -  Creatinine 0.44 - 1.00 mg/dL 0.87 1.02(H) 1.27(H)  Sodium 135 - 145 mmol/L 140 141 -  Potassium 3.5 - 5.1 mmol/L 4.1 4.5 -  Chloride 101 - 111 mmol/L 117(H) 115(H) -  CO2 22 - 32 mmol/L 19(L) 20(L) -  Calcium 8.9 - 10.3 mg/dL 8.2(L) 7.3(L) -  Total Protein 6.0 - 8.3 g/dL - - -  Total Bilirubin 0.2 - 1.2 mg/dL - - -  Alkaline Phos 39 - 117 U/L - - -  AST 0 - 37 U/L - - -  ALT 0 - 35 U/L - - -     CBC Latest Ref Rng & Units 11/11/2016 11/10/2016 11/09/2016  WBC 4.0 - 10.5 K/uL 10.1 14.8(H) 30.9(H)  Hemoglobin 12.0 - 15.0 g/dL 10.5(L) 10.3(L) 11.7(L)  Hematocrit 36.0 - 46.0 % 32.9(L) 32.7(L) 37.4  Platelets 150 - 400 K/uL 155 179 245     ABG    Component Value Date/Time   TCO2 28 11/26/2015 2230     CBG (last 3)   Recent Labs  11/10/16 0400 11/10/16 0844 11/10/16 1237  GLUCAP 82 82 99     Imaging: Dg Chest 2 View  Result Date: 11/09/2016 CLINICAL DATA:  Chest pain and [name] EXAM: CHEST  2  VIEW COMPARISON:  11/26/2015 FINDINGS: Cardiac shadow is within normal limits. A hiatal hernia is again identified and stable. The lungs are well aerated bilaterally. Right middle lobe infiltrate is noted best seen on the lateral projection. No sizable effusion is seen. No bony abnormality is noted. IMPRESSION: Right middle lobe infiltrate. Electronically Signed   By: Inez Catalina M.D.   On: 11/09/2016 15:59     Studies: PFT 02/13/16 >>  0.94 (53%), FEV1% 47, DLCO 51%   Antibiotics: Rocephin 3/10 >> Zithromax 3/10 >>  Cultures: Blood 3/10 >> Sputum 3/10 >>  Events: 3/09 Admit 3/10 Off pressors, transfer to floor bed  Assessment/plan:  Sepsis 2nd to CAP. - day 3 of Abx - KVO IV fluids - f/u CXR 3/12  COPD with emphysema. - breo, incruse  Hx of HTN, HLD, CVA. - continue ASA  Updated family at bedside  Chesley Mires, MD [place] 11/11/2016, 10:59 AM Pager:  [phone] After 3pm call: [phone]

## 2016-11-11 NOTE — Progress Notes (Signed)
PHARMACIST - PHYSICIAN COMMUNICATION  CONCERNING: Antibiotic IV to Oral Route Change Policy  RECOMMENDATION: This patient is receiving Azithromycin by the intravenous route.  Based on criteria approved by the Pharmacy and Therapeutics Committee, the antibiotic(s) is/are being converted to the equivalent oral dose form(s).   DESCRIPTION: These criteria include:  Patient being treated for a respiratory tract infection, urinary tract infection, cellulitis or clostridium difficile associated diarrhea if on metronidazole  The patient is not neutropenic and does not exhibit a GI malabsorption state  The patient is eating (either orally or via tube) and/or has been taking other orally administered medications for a least 24 hours  The patient is improving clinically and has a Tmax < 100.5  If you have questions about this conversion, please contact the Pharmacy Department  []   [phone] )  Sherry Lambert []   [phone] )  [facility] []   [phone] )  Sherry Lambert []   [phone] )  [facility] []   [phone] )  [place], [place] D 11/11/2016 2:17 PM

## 2016-11-12 ENCOUNTER — Inpatient Hospital Stay (HOSPITAL_COMMUNITY): Payer: Medicare HMO

## 2016-11-12 DIAGNOSIS — A419 Sepsis, unspecified organism: Secondary | ICD-10-CM

## 2016-11-12 DIAGNOSIS — J189 Pneumonia, unspecified organism: Secondary | ICD-10-CM

## 2016-11-12 DIAGNOSIS — J181 Lobar pneumonia, unspecified organism: Secondary | ICD-10-CM

## 2016-11-12 LAB — BASIC METABOLIC PANEL
ANION GAP: 6 (ref 5–15)
BUN: 12 mg/dL (ref 6–20)
CHLORIDE: 111 mmol/L (ref 101–111)
CO2: 24 mmol/L (ref 22–32)
Calcium: 9 mg/dL (ref 8.9–10.3)
Creatinine, Ser: 0.82 mg/dL (ref 0.44–1.00)
GFR calc Af Amer: >60 mL/min (ref >60)
Glucose, Bld: 104 mg/dL — ABNORMAL HIGH (ref 65–99)
POTASSIUM: 4.1 mmol/L (ref 3.5–5.1)
SODIUM: 141 mmol/L (ref 135–145)

## 2016-11-12 LAB — CBC
HEMATOCRIT: 33.3 % — AB (ref 36.0–46.0)
HEMOGLOBIN: 10.4 g/dL — AB (ref 12.0–15.0)
MCH: 28.3 pg (ref 26.0–34.0)
MCHC: 31.2 g/dL (ref 30.0–36.0)
MCV: 90.7 fL (ref 78.0–100.0)
Platelets: 194 10*3/uL (ref 150–400)
RBC: 3.67 MIL/uL — ABNORMAL LOW (ref 3.87–5.11)
RDW: 14.2 % (ref 11.5–15.5)
WBC: 11.2 10*3/uL — AB (ref 4.0–10.5)

## 2016-11-12 LAB — CULTURE, RESPIRATORY W GRAM STAIN: Culture: NORMAL

## 2016-11-12 LAB — CULTURE, RESPIRATORY

## 2016-11-12 MED ORDER — BUTALBITAL-APAP-CAFFEINE 50-325-40 MG PO TABS
1.0000 | ORAL_TABLET | ORAL | Status: DC | PRN
  Administered 2016-11-12 – 2016-11-13 (×2): 1 via ORAL
  Filled 2016-11-12 (×3): qty 1

## 2016-11-12 MED ORDER — AMOXICILLIN-POT CLAVULANATE 875-125 MG PO TABS
1.0000 | ORAL_TABLET | Freq: Two times a day (BID) | ORAL | Status: DC
  Administered 2016-11-12 – 2016-11-14 (×5): 1 via ORAL
  Filled 2016-11-12 (×5): qty 1

## 2016-11-12 NOTE — Evaluation (Signed)
Physical Therapy Evaluation Patient Details Name: Sherry Lambert MRN: [id number] DOB: 1940/12/29 Today's Date: 11/12/2016   History of Present Illness  76 y/o woman with a Hx of COPD (sees Dr. Corrie Dandy in clinic), Stroke (L hemisphere), as well as PAD who presents to the ED with a two day history of dyspnea, worsening R-sided weakness, and diarrhea. She endorsed some fatigue and malaise as well. She presented to the ED where she was found to have a right middle lobe PNA.   Clinical Impression  Pt admitted with above diagnosis. Pt currently with functional limitations due to the deficits listed below (see PT Problem List). Pt ambulated 140' with RW, SaO2 87-94% on RA, 2/4 dyspnea, HR 123 max, no loss of balance. Pt is near baseline with mobility.  Pt will benefit from skilled PT to increase their independence and safety with mobility to allow discharge to the venue listed below.       Follow Up Recommendations No PT follow up    Equipment Recommendations  None recommended by PT    Recommendations for Other Services       Precautions / Restrictions Precautions Precautions: Other (comment) Precaution Comments: monitor O2, denies falls in past 1 year Restrictions Weight Bearing Restrictions: No      Mobility  Bed Mobility Overal bed mobility: Modified Independent             General bed mobility comments: with rail, HOB up  Transfers Overall transfer level: Needs assistance Equipment used: Rolling walker (2 wheeled) Transfers: Sit to/from Stand Sit to Stand: Min guard         General transfer comment: VCs hand placement, min/guard for safety  Ambulation/Gait Ambulation/Gait assistance: Min guard Ambulation Distance (Feet): 140 Feet Assistive device: Rolling walker (2 wheeled) Gait Pattern/deviations: Step-through pattern   Gait velocity interpretation: at or above normal speed for age/gender General Gait Details: steady, no LOB, SaO2 87-94% on RA (dropped to 87%  only for last 10' of walk), 2/4 dyspnea, HR 124 walking  Stairs            Wheelchair Mobility    Modified Rankin (Stroke Patients Only)       Balance Overall balance assessment: Modified Independent                                           Pertinent Vitals/Pain Pain Assessment: No/denies pain    Home Living Family/patient expects to be discharged to:: Private residence Living Arrangements: Alone Available Help at Discharge: Family;Available PRN/intermittently Type of Home: House Home Access: Stairs to enter Entrance Stairs-Rails: Right Entrance Stairs-Number of Steps: 2 Home Layout: One level Home Equipment: Walker - 2 wheels;Walker - 4 wheels;Cane - single point;Grab bars - tub/shower;Shower seat;Toilet riser      Prior Function Level of Independence: Independent with assistive device(s)         Comments: doesn't drive 2* CVA, uses rollator or cane, independent ADLs     Hand Dominance   Dominant Hand: Right    Extremity/Trunk Assessment   Upper Extremity Assessment Upper Extremity Assessment: RUE deficits/detail (shoulder elevation ~50* limited by pain, had recent cortizone shot, decr sensation to light touch R hand 2* CVA)    Lower Extremity Assessment Lower Extremity Assessment: Overall WFL for tasks assessed    Cervical / Trunk Assessment Cervical / Trunk Assessment: Normal  Communication   Communication: No difficulties  Cognition Arousal/Alertness: Awake/alert Behavior During Therapy: WFL for tasks assessed/performed Overall Cognitive Status: Within Functional Limits for tasks assessed                      General Comments      Exercises     Assessment/Plan    PT Assessment Patient needs continued PT services  PT Problem List Decreased strength;Decreased range of motion;Decreased activity tolerance;Cardiopulmonary status limiting activity;Decreased mobility       PT Treatment Interventions Gait  training;Stair training;Functional mobility training    PT Goals (Current goals can be found in the Care Plan section)  Acute Rehab PT Goals Patient Stated Goal: do housework, get home to her cat PT Goal Formulation: With patient Time For Goal Achievement: 11/26/16 Potential to Achieve Goals: Good    Frequency Min 3X/week   Barriers to discharge        Co-evaluation               End of Session Equipment Utilized During Treatment: Gait belt Activity Tolerance: Patient tolerated treatment well Patient left: in chair;with call bell/phone within reach Nurse Communication: Mobility status PT Visit Diagnosis: Difficulty in walking, not elsewhere classified (R26.2)         Time: 1414-1440 PT Time Calculation (min) (ACUTE ONLY): 26 min   Charges:   PT Evaluation $PT Eval Low Complexity: 1 Procedure PT Treatments $Gait Training: 8-22 mins   PT G Codes:         Philomena Doheny 11/12/2016, 2:54 PM [phone]

## 2016-11-12 NOTE — Progress Notes (Signed)
MD paged to be notified about pt's report of reflux. Awaiting a response. Will continue to monitor.

## 2016-11-12 NOTE — Progress Notes (Signed)
Name: Sherry Lambert MRN: [id number] DOB: [birth year]    ADMISSION DATE:  11/09/2016 CONSULTATION DATE:  11/09/2016  REFERRING MD :  Dr. Maryan Rued  CHIEF COMPLAINT:  Dyspnea   BRIEF PATIENT DESCRIPTION:  76 yo female with dyspnea, diarrhea, fatigue, malaise.  Found to have pneumonia with sepsis. Followed by Dr. Corrie Dandy for COPD, and has hx of CVA with Rt sided weakness  SIGNIFICANT EVENTS  3/9 > Presents to ED  3/10 > Transfer to floor   STUDIES:  PFT 02/13/16 >  0.94 (53%), FEV1% 47, DLCO 51%  CXR 3/9 > Right middle lobe infiltrate   CXR 3/12 > Small right pleural effusion and hazy right lower lobe airspace disease concerning for PNA   ANTIBIOTICS:  Rocephin 3/10 > 3/12 Zithromax 3/10 > 3/12  CULTURES  Blood 3/10 > Negative to Date >>  Sputum 3/10 > Normal Respiratory Flora  MRSA PCR 3/9 > Neg   SUBJECTIVE:  Denies Sputum production. Ambulated with RN on Room Air, Saturation 88-90%   VITAL SIGNS: Temp:  [97.7 F (36.5 C)-98.2 F (36.8 C)] 98 F (36.7 C) (03/12 1339) Pulse Rate:  [76-88] 88 (03/12 1339) Resp:  [18] 18 (03/12 1339) BP: (115-127)/(50-59) 115/59 (03/12 1339) SpO2:  [96 %-100 %] 96 % (03/12 1339) Weight:  [58.3 kg (128 lb 8 oz)] 58.3 kg (128 lb 8 oz) (03/12 0500)  PHYSICAL EXAMINATION: General:  Elderly female, no distress  Neuro:  Alert, oriented, follows commands  HEENT:  Normocephalic  Cardiovascular:  RRR, no MRG, NI S1/S2  Lungs:  No Wheezes/Crackles, non-labored  Abdomen:  Non-tender, active bowel sounds  Musculoskeletal: no acute  Skin:  Warm, dry, intact    Recent Labs Lab 11/10/16 0541 11/11/16 0746 11/12/16 0452  NA 141 140 141  K 4.5 4.1 4.1  CL 115* 117* 111  CO2 20* 19* 24  BUN 17 12 12   CREATININE 1.02* 0.87 0.82  GLUCOSE 106* 86 104*    Recent Labs Lab 11/10/16 0541 11/11/16 0746 11/12/16 0452  HGB 10.3* 10.5* 10.4*  HCT 32.7* 32.9* 33.3*  WBC 14.8* 10.1 11.2*  PLT 179 155 194   Dg Chest 2 View  Result Date:  11/12/2016 CLINICAL DATA:  No chest complaints. No fever. History of community acquired pneumonia. EXAM: CHEST  2 VIEW COMPARISON:  11/09/2016 FINDINGS: Small right pleural effusion. Hazy right lower lobe airspace disease. Left lung is clear. No left pleural effusion. No pneumothorax. Lungs are hyperinflated as can be seen with COPD. Stable cardiomediastinal silhouette. Thoracic aortic atherosclerosis. Moderate size hiatal hernia. IMPRESSION: Small right pleural effusion and hazy right lower lobe airspace disease concerning for pneumonia. Followup PA and lateral chest X-ray is recommended in 3-4 weeks following trial of antibiotic therapy to ensure resolution and exclude underlying malignancy. 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 H/O COPD with emphysema Plan -Maintain Oxygenation >88 -Pulmonary Hygiene > Mobilize, IS, Flutter valve  -Change IV antibiotics to PO Augmentin for 7 days  -Continue Breoellipta  -Will need follow up appointment in 1-2 weeks   H/O HTN, HLD, CVA Plan -Continue ASA  Urinary Retention  Plan  -D/C Foley Catheter  -Bladder scan q6h if unable to void  Hayden Pedro, AG-ACNP  Pulmonary & Critical Care  Pgr: [phone]  PCCM Pgr: [phone]  STAFF NOTE: Linwood Dibbles, MD FACP have personally reviewed patient's available data, including  medical history, events of note, physical examination and test results as part of my evaluation. I have discussed with resident/NP and other care providers such as pharmacist, RN and RRT. In addition, I personally evaluated patient and elicited key findings of: alert, just back from pcxr, no distress, on RA, lungs clear, mild scattered ronchi rt base, pcxr with small effusion/ atx rt base, she grew Normal flora, narrow to augmentin, complete 7 days therapy, allow neg balance, was neg 900 cc mobilizing prior  volume likely, await PT recs for Dc needs, ambulatory pulse ox needed   Lavon Paganini. Titus Mould, MD, [place] Pgr: [place] [facility] Critical [facility] 11/12/2016 4:06 PM

## 2016-11-13 LAB — BILIRUBIN, TOTAL: Total Bilirubin: 0.5 mg/dL (ref 0.3–1.2)

## 2016-11-13 MED ORDER — LOPERAMIDE HCL 2 MG PO CAPS
2.0000 mg | ORAL_CAPSULE | ORAL | Status: DC | PRN
  Administered 2016-11-13: 2 mg via ORAL
  Filled 2016-11-13: qty 1

## 2016-11-13 NOTE — Progress Notes (Signed)
Name: Sherry Lambert MRN: [id number] DOB: 13-May-1941    ADMISSION DATE:  11/09/2016 CONSULTATION DATE:  11/09/2016  REFERRING MD :  Dr. Maryan Rued   CHIEF COMPLAINT:  Dyspnea   BRIEF PATIENT DESCRIPTION:  76 yo female with dyspnea, diarrhea, fatigue, malaise. Found to have pneumonia with sepsis. Followed by Dr. Corrie Dandy for COPD, and has hx of CVA with Rt sided weakness  SIGNIFICANT EVENTS  3/9 > Presents to ED  3/10 > Transfer to floor   STUDIES:  PFT 02/13/16 >0.94 (53%), FEV1% 47, DLCO 51%  CXR 3/9 > Right middle lobe infiltrate   CXR 3/12 > Small right pleural effusion and hazy right lower lobe airspace disease concerning for PNA   ANTIBIOTICS:  Rocephin 3/10 > 3/12 Zithromax 3/10 > 3/12 Augmentin 3/12 >   CULTURES  Blood 3/10 > Negative to Date >>  Sputum 3/10 > Normal Respiratory Flora  MRSA PCR 3/9 > Neg   SUBJECTIVE:  Reports multiple loose stools (states she has at this at home previously and it comes and goes) overnight.   VITAL SIGNS: Temp:  [98 F (36.7 C)-98.1 F (36.7 C)] 98.1 F (36.7 C) (03/13 0501) Pulse Rate:  [82-123] 93 (03/13 0501) Resp:  [18] 18 (03/13 0501) BP: (115-126)/(53-60) 126/60 (03/13 0501) SpO2:  [87 %-100 %] 92 % (03/13 0800) Weight:  [55.8 kg (123 lb 1.6 oz)] 55.8 kg (123 lb 1.6 oz) (03/13 0530)  PHYSICAL EXAMINATION: General:  Elderly female, no distress  Neuro:  Alert, oriented, grossly intact  HEENT:  Normocephalic  Cardiovascular:  RRR, no MRG,  Lungs:  Clear breath sounds, no wheeze, non-labored on room air  Abdomen:  Non-distended, non-tender, active bowels sounds  Musculoskeletal:  No acute  Skin:  Warm, dry, intact    Recent Labs Lab 11/10/16 0541 11/11/16 0746 11/12/16 0452  NA 141 140 141  K 4.5 4.1 4.1  CL 115* 117* 111  CO2 20* 19* 24  BUN 17 12 12   CREATININE 1.02* 0.87 0.82  GLUCOSE 106* 86 104*    Recent Labs Lab 11/10/16 0541 11/11/16 0746 11/12/16 0452  HGB 10.3* 10.5* 10.4*  HCT 32.7* 32.9*  33.3*  WBC 14.8* 10.1 11.2*  PLT 179 155 194   Dg Chest 2 View  Result Date: 11/12/2016 CLINICAL DATA:  No chest complaints. No fever. History of community acquired pneumonia. EXAM: CHEST  2 VIEW COMPARISON:  11/09/2016 FINDINGS: Small right pleural effusion. Hazy right lower lobe airspace disease. Left lung is clear. No left pleural effusion. No pneumothorax. Lungs are hyperinflated as can be seen with COPD. Stable cardiomediastinal silhouette. Thoracic aortic atherosclerosis. Moderate size hiatal hernia. IMPRESSION: Small right pleural effusion and hazy right lower lobe airspace disease concerning for pneumonia. Followup PA and lateral chest X-ray is recommended in 3-4 weeks following trial of antibiotic therapy to ensure resolution and exclude underlying malignancy. 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 H/O COPD with emphysema Plan -Maintain Oxygenation >88 -Pulmonary Hygiene > Mobilize, IS, Flutter valve  -PO Augmentin for 7 days (Began 3/12) -Continue Breoellipta  -Will need follow up appointment in 1-2 weeks   H/O HTN, HLD, CVA Plan -Continue ASA   Urinary Retention - Resolved  Plan  -Monitor I & O   Diarrhea - Patient reports chronic problem  Plan -Send bilirubin  -Send O&P -Imodium PRN    Hayden Pedro, AG-ACNP  [place] [facility]  Pgr: [phone]  PCCM Pgr: [phone]  STAFF NOTE: Linwood Dibbles, MD FACP have personally reviewed patient's available data, including medical history, events of note, physical examination and test results as part of my evaluation. I have discussed with resident/NP and other care providers such as pharmacist, RN and RRT. In addition, I personally evaluated patient and elicited key findings of: alert in chair, no distress, improved BS rt base, abdo soft, BS wnl, no r/g, no fevers, will complete oral  course 7 days for PNA with Augmentin, she describes loose stools with urination, has had before and smell and amount has not changed since on ABX, immodium given but she is not worried about constipation, she may benefit from GI consult in house and to be followed up as outpt, colitis?, IBS?, , I updated her on her condition   Lavon Paganini. Titus Mould, MD, [place] Pgr: [place] [facility] 11/13/2016 1:44 PM

## 2016-11-13 NOTE — Care Management Important Message (Signed)
Important Message  Patient Details  Name: Sherry Lambert MRN: [id number] Date of Birth: 1940/11/07   Medicare Important Message Given:  Yes    Nathen May 11/13/2016, 12:39 PM

## 2016-11-14 LAB — BASIC METABOLIC PANEL
ANION GAP: 8 (ref 5–15)
BUN: 7 mg/dL (ref 6–20)
CHLORIDE: 106 mmol/L (ref 101–111)
CO2: 26 mmol/L (ref 22–32)
Calcium: 9 mg/dL (ref 8.9–10.3)
Creatinine, Ser: 0.77 mg/dL (ref 0.44–1.00)
GFR calc non Af Amer: >60 mL/min (ref >60)
Glucose, Bld: 108 mg/dL — ABNORMAL HIGH (ref 65–99)
POTASSIUM: 3.7 mmol/L (ref 3.5–5.1)
Sodium: 140 mmol/L (ref 135–145)

## 2016-11-14 LAB — CBC
HCT: 33.4 % — ABNORMAL LOW (ref 36.0–46.0)
HEMOGLOBIN: 10.6 g/dL — AB (ref 12.0–15.0)
MCH: 28.5 pg (ref 26.0–34.0)
MCHC: 31.7 g/dL (ref 30.0–36.0)
MCV: 89.8 fL (ref 78.0–100.0)
PLATELETS: 212 10*3/uL (ref 150–400)
RBC: 3.72 MIL/uL — AB (ref 3.87–5.11)
RDW: 14 % (ref 11.5–15.5)
WBC: 8.2 10*3/uL (ref 4.0–10.5)

## 2016-11-14 LAB — CULTURE, BLOOD (ROUTINE X 2)
Culture: NO GROWTH
Culture: NO GROWTH

## 2016-11-14 MED ORDER — AMOXICILLIN-POT CLAVULANATE 875-125 MG PO TABS: 1.0000 | ORAL_TABLET | Freq: Two times a day (BID) | ORAL | 0 refills | Status: AC

## 2016-11-14 NOTE — Progress Notes (Signed)
Physical Therapy Treatment Patient Details Name: OAKLIE DURRETT MRN: [id number] DOB: [birth year] Today's Date: 11/14/2016    History of Present Illness 76 y/o woman with a history of COPD, Stroke (L hemisphere), as well as PAD who presents to the ED with a two day history of dyspnea, worsening R-sided weakness, and diarrhea. She endorsed some fatigue and malaise as well. She presented to the ED where she was found to have a right middle lobe PNA.     PT Comments    Pt assisted to bathroom and requested to dress so provided assist for lower body dressing prior to ambulating.  Pt with SpO2 89% room air after using bathroom however 93% room air during ambulation.  Pt reports d/c home today.   Follow Up Recommendations  No PT follow up     Equipment Recommendations  None recommended by PT    Recommendations for Other Services       Precautions / Restrictions Precautions Precaution Comments: monitor O2    Mobility  Bed Mobility               General bed mobility comments: pt up in recliner on arrival  Transfers Overall transfer level: Needs assistance Equipment used: Rolling walker (2 wheeled) Transfers: Sit to/from Stand Sit to Stand: Min guard         General transfer comment: good hand placement  Ambulation/Gait Ambulation/Gait assistance: Min guard Ambulation Distance (Feet): 80 Feet Assistive device: Rolling walker (2 wheeled) Gait Pattern/deviations: Step-through pattern     General Gait Details: steady with RW, dyspnea after using bathroom prior to hallway ambulation with SpO2 89% room air, however 93% room air during ambulation   Stairs            Wheelchair Mobility    Modified Rankin (Stroke Patients Only)       Balance                                    Cognition Arousal/Alertness: Awake/alert Behavior During Therapy: WFL for tasks assessed/performed Overall Cognitive Status: Within Functional Limits for tasks  assessed                      Exercises      General Comments        Pertinent Vitals/Pain Pain Assessment: No/denies pain    Home Living                      Prior Function            PT Goals (current goals can now be found in the care plan section) Progress towards PT goals: Progressing toward goals    Frequency    Min 3X/week      PT Plan Current plan remains appropriate    Co-evaluation             End of Session   Activity Tolerance: Patient tolerated treatment well Patient left: in chair;with call bell/phone within reach   PT Visit Diagnosis: Difficulty in walking, not elsewhere classified (R26.2)     Time: 0272-5366 PT Time Calculation (min) (ACUTE ONLY): 20 min  Charges:  $Gait Training: 8-22 mins                    G Codes:       Haset [place],KATHrine E 11/14/2016, 12:57 PM Carmelia Bake, PT, DPT 11/14/2016 Pager:  [phone]   

## 2016-11-14 NOTE — Discharge Summary (Signed)
Physician Discharge Summary       Patient ID: Sherry Lambert MRN: [id number] DOB/AGE: 1941-03-27 76 y.o.  Admit date: 11/09/2016 Discharge date: 11/14/2016  Discharge Diagnoses:  Active Problems:   Septic shock ([place])   Community acquired pneumonia of right middle lobe of lung ([place])   Sepsis ([place])   Community acquired pneumonia of right lower lobe of lung ([place])   History of Present Illness: Sherry Lambert is a 76 y/o woman with a Hx of COPD (sees Dr. Corrie Dandy in clinic), Stroke (L hemisphere), as well as PAD who presents to the ED with a two day history of dyspnea, worsening R-sided weakness, and diarrhea. Sherry Lambert endorsed some fatigue and malaise as well. Sherry Lambert presented to the ED where Sherry Lambert was found to have a right middle lobe PNA. Sherry Lambert was persistently hypotensive, and was started on leveophed.  Hospital Course:  Sherry Lambert was admitted to the critical care service with for septic vs hypovolemic shock in the setting of CAP. Sherry Lambert required pressors and was treated with ceftriaxone and azithromycin. Sherry Lambert slowly improved and was able to come of levophed. Course complicated by persistent dyspnea and diarrhea which proved to be chronic and improved with imodium. 3/14 Sherry Lambert was deemed a candidate for discharge to home.   Discharge Plan by active problems  CAP - to complete 7 day course of Augmentin  COPD with emphysema - Continue Breo, Incruse - Pulmonary follow up  Diarrhea, chronic - Sherry Lambert has appt with [facility] 3/26 - OTC Imodium as needed  Hx HTN, HLD, CVA - Continue ASA    Significant Hospital tests/ studies   Consults   Discharge Exam: BP 128/61 (BP Location: Left Arm)   Pulse 92   Temp 98.8 F (37.1 C) (Oral)   Resp 18   Ht 5' (1.524 m)   Wt 56 kg (123 lb 7.3 oz)   SpO2 95%   BMI 24.11 kg/m   General:  Elderly female in NAD sitting in chair Neuro:  Alert, oriented, non-focal HEENT:  [place]/AT, No JVD noted, PERRL Cardiovascular:  RRR, no MRG Lungs:  Clear bilateral breath  sounds Abdomen:  Soft, non-distended, non-tender Musculoskeletal:  No acute deformity Skin:  Intact, MMM   Labs at discharge Lab Results  Component Value Date   CREATININE 0.77 11/14/2016   BUN 7 11/14/2016   NA 140 11/14/2016   K 3.7 11/14/2016   CL 106 11/14/2016   CO2 26 11/14/2016   Lab Results  Component Value Date   WBC 8.2 11/14/2016   HGB 10.6 (L) 11/14/2016   HCT 33.4 (L) 11/14/2016   MCV 89.8 11/14/2016   PLT 212 11/14/2016   Lab Results  Component Value Date   ALT 14 05/14/2016   AST 16 05/14/2016   ALKPHOS 65 05/14/2016   BILITOT 0.5 11/13/2016   Lab Results  Component Value Date   INR 0.99 10/02/2014   INR 1.01 07/28/2014    Current radiology studies No results found.  Disposition:  01-Home or Self Care  Discharge Instructions    Call MD for:  difficulty breathing, headache or visual disturbances    Complete by:  As directed    Call MD for:  temperature >100.4    Complete by:  As directed    Diet - low sodium heart healthy    Complete by:  As directed    Increase activity slowly    Complete by:  As directed      Allergies as of 11/14/2016  Reactions   Lipitor [atorvastatin] Itching      Medication List    TAKE these medications   amoxicillin-clavulanate 875-125 MG tablet Commonly known as:  AUGMENTIN Take 1 tablet by mouth every 12 (twelve) hours.   aspirin EC 81 MG tablet Take 1 tablet (81 mg total) by mouth daily.   budesonide 0.5 MG/2ML nebulizer solution Commonly known as:  PULMICORT Take 2 mLs (0.5 mg total) by nebulization 2 (two) times daily.   diphenhydrAMINE 25 MG tablet Commonly known as:  BENADRYL Take 25 mg by mouth every 6 (six) hours as needed for allergies.   fluticasone furoate-vilanterol 200-25 MCG/INH Aepb Commonly known as:  BREO ELLIPTA Inhale 1 puff into the lungs daily. What changed:  Another medication with the same name was removed. Continue taking this medication, and follow the directions you see  here.   ipratropium-albuterol 0.5-2.5 (3) MG/3ML Soln Commonly known as:  DUONEB Take 3 mLs by nebulization 4 (four) times daily. And as needed What changed:  additional instructions   lisinopril-hydrochlorothiazide 20-12.5 MG tablet Commonly known as:  ZESTORETIC Take 1 tablet by mouth daily.   omeprazole 40 MG capsule Commonly known as:  PRILOSEC Take 1 capsule (40 mg total) by mouth 2 (two) times daily before a meal. Take before breakfast and before dinner   pravastatin 20 MG tablet Commonly known as:  PRAVACHOL Take 1 tablet (20 mg total) by mouth daily.   PROAIR HFA 108 (90 Base) MCG/ACT inhaler Generic drug:  albuterol Inhale 2 puffs into the lungs every 6 (six) hours as needed for shortness of breath.   umeclidinium bromide 62.5 MCG/INH Aepb Commonly known as:  INCRUSE ELLIPTA Inhale 1 puff into the lungs daily.      Follow-up Information    Amy Trellis Paganini, PA-C Follow up on 11/22/2016.   Specialty:  Gastroenterology Why:  2 PM  appointment with GI for evaluation of diarrhea Contact information: [place] [phone]        Amery, MD Follow up on 11/21/2016.   Specialty:  Pulmonary Disease Why:  4:15 PM. Second Floor Contact information: [address] [place] [address] [phone]           Discharged Condition: good  Greater than 35 minutes of time have been dedicated to discharge assessment, planning and discharge instructions.   Signed: Georgann Housekeeper, AGACNP-BC [facility] Pager [phone] or [phone]  11/14/2016 12:11 PM    STAFF NOTE: Linwood Dibbles, MD FACP have personally reviewed patient's available data, including medical history, events of note, physical examination and test results as part of my evaluation. I have discussed with resident/NP and other care providers such as pharmacist, RN and RRT. In addition, I personally evaluated patient and  elicited key findings [id number], in chair, no distress, resolved RT base ronchi, stool improved, Sherry Lambert will f/w Dr Corrie Dandy and Gi outpt, augmentin to course finish  Lavon Paganini. Titus Mould, MD, [place] Pgr: [facility] 11/14/2016 2:27 PM

## 2016-11-16 ENCOUNTER — Ambulatory Visit: Payer: Medicare HMO | Admitting: Internal Medicine

## 2016-11-16 LAB — O&P RESULT

## 2016-11-16 LAB — OVA + PARASITE EXAM

## 2016-11-21 ENCOUNTER — Encounter: Payer: Self-pay | Admitting: Pulmonary Disease

## 2016-11-21 ENCOUNTER — Ambulatory Visit (INDEPENDENT_AMBULATORY_CARE_PROVIDER_SITE_OTHER): Payer: Medicare HMO | Admitting: Pulmonary Disease

## 2016-11-21 ENCOUNTER — Telehealth: Payer: Self-pay | Admitting: Internal Medicine

## 2016-11-21 ENCOUNTER — Ambulatory Visit: Payer: Medicare HMO

## 2016-11-21 VITALS — BP 114/60 | HR 85 | Ht 60.0 in | Wt 119.8 lb

## 2016-11-21 DIAGNOSIS — J430 Unilateral pulmonary emphysema [MacLeod's syndrome]: Secondary | ICD-10-CM

## 2016-11-21 DIAGNOSIS — J189 Pneumonia, unspecified organism: Secondary | ICD-10-CM | POA: Diagnosis not present

## 2016-11-21 DIAGNOSIS — J181 Lobar pneumonia, unspecified organism: Secondary | ICD-10-CM | POA: Diagnosis not present

## 2016-11-21 DIAGNOSIS — R0609 Other forms of dyspnea: Secondary | ICD-10-CM

## 2016-11-21 DIAGNOSIS — R0600 Dyspnea, unspecified: Secondary | ICD-10-CM

## 2016-11-21 MED ORDER — FLUCONAZOLE 150 MG PO TABS: 150.0000 mg | ORAL_TABLET | Freq: Once | ORAL | 0 refills | Status: AC

## 2016-11-21 NOTE — Telephone Encounter (Signed)
Called and inform patient.

## 2016-11-21 NOTE — Patient Instructions (Signed)
It was a pleasure taking care of you today!  You are diagnosed with Chronic Obstructive Pulmonary Disease or COPD.  COPD is a preventable and treatable disease that makes it difficult to empty air out of the lungs (airflow obstruction).  This can lead to shortness of breath.   Sometimes, when you have a lung infection, this can make your breathing worse, and will cause you to have a COPD flare-up or an acute exacerbation of COPD. Please call your primary care doctor or the office if you are having a COPD flare-up.   Smoking makes COPD worse.   Make sure you use your medications for COPD -- Maintenance medications : Breo and Incruse daily. 1 puff each.   Rescue medications: Albuterol 2 puffs every 4 hours as needed for shortness of breath.   Please rinse your mouth each time you use your maintenance medication.  Please call the office if you are having issues with your medications  Return to clinic in 6 months with NP

## 2016-11-21 NOTE — Progress Notes (Signed)
Subjective:    Patient ID: Sherry Lambert, female    DOB: September 21, 1940, 76 y.o.   MRN: [id number]  HPI   This is the case of Sherry Lambert, 76 y.o. Female, who was referred by Dr. [name]  in consultation regarding SOB.   As you very well know, patient has a 64 PY smoking history, quit when she was in her 2s.  Pt was dxed with copd couple yrs ago. She is on advair 100/50 1P BID. Using it x 3 yrs.  She was dxed with PNA in 09/2015. Was slow to recover. Now at baseline.   DATA PFT (02/13/16)  FEV1/FVC 45%, FEV1  0.82  46%. No hyperinflation. DLCO  51%  ROV (02/13/16)  Pt returns to office as f/u. Started on neb meds < 1 week. No recnt infxn or abx. Pulled L chest muscle last week. She feels better on Breo. Neb meds are too time consuming.  SOB better now the last week.  Has not been admitted nor has been on abx since last seen.   ROV 05/23/16. Patient returns to office as follow-up on her COPD. She continues to feel better with Breo and Incruse. She wonders if incruse is making a difference > she sometimes forget to use it. SOB at baseline.  She will be in a doughnut hole soon > wants to hold off on O2 at HS.   ROV 11/21/2016 Patient returns to the office as follow-up on her COPD. Since last seen, her COPD has been doing well until the last week. She had worsening dyspnea with worsening diarrhea for which she ended up being admitted last Friday for 5 days. She was diagnosed with shock likely septic and hypovolemic. She was given broad-spectrum antibiotics she quickly improved. She was discharged to finish Augmentin for 7 days. Almost back at her baseline. Diarrhea significantly improved.    Review of Systems  Constitutional: Negative.  Negative for fever and unexpected weight change.  HENT: Negative for congestion, dental problem, ear pain, nosebleeds, postnasal drip, rhinorrhea, sinus pressure, sneezing, sore throat and trouble swallowing.   Eyes: Negative.  Negative for redness and  itching.  Respiratory: Positive for shortness of breath. Negative for cough, chest tightness and wheezing.   Cardiovascular: Negative for palpitations and leg swelling.  Gastrointestinal: Negative.  Negative for nausea and vomiting.  Endocrine: Negative.   Genitourinary: Negative.  Negative for dysuria.  Musculoskeletal: Positive for arthralgias. Negative for joint swelling.  Skin: Negative for rash.  Allergic/Immunologic: Positive for environmental allergies.  Neurological: Negative.  Negative for headaches.  Hematological: Bruises/bleeds easily.  Psychiatric/Behavioral: Negative.  Negative for dysphoric mood. The patient is not nervous/anxious.           Objective:   Physical Exam   Vitals:  Vitals:   11/21/16 1609  BP: 114/60  Pulse: 85  SpO2: 99%  Weight: 119 lb 12.8 oz (54.3 kg)  Height: 5' (1.524 m)    Constitutional/General:  Pleasant, well-nourished, well-developed, not in any distress,  Comfortably seating.  Well kempt  Body mass index is 23.4 kg/m. Wt Readings from Last 3 Encounters:  11/21/16 119 lb 12.8 oz (54.3 kg)  11/14/16 123 lb 7.3 oz (56 kg)  09/14/16 119 lb (54 kg)    HEENT: Pupils equal and reactive to light and accommodation. Anicteric sclerae. Normal nasal mucosa.   No oral  lesions,  mouth clear,  oropharynx clear, no postnasal drip. (-) Oral thrush. No dental caries.  Airway - Mallampati class III  Neck: No masses. Midline trachea. No JVD, (-) LAD. (-) bruits appreciated.  Respiratory/Chest: Grossly normal chest. (-) deformity. (-) Accessory muscle use.  Symmetric expansion. (-) Tenderness on palpation.  Resonant on percussion.  Diminished BS on both lower lung zones. (-)rhonchi, (+) crackles at bases. (-) wheezing  (-) egophony  Cardiovascular: Regular rate and  rhythm, heart sounds normal, no murmur or gallops, no peripheral edema  Gastrointestinal:  Normal bowel sounds. Soft, non-tender. No hepatosplenomegaly.  (-) masses.    Musculoskeletal:  Normal muscle tone. Normal gait. Has a cast in RLE 2/2 recent plantar fasciitis.   Extremities: Grossly normal. (-) clubbing, cyanosis.  (-) edema  Skin: (-) rash,lesions seen.   Neurological/Psychiatric : alert, oriented to time, place, person. Normal mood and affect          Assessment & Plan:  COPD (chronic obstructive pulmonary disease) ([place]) Pt with copd. 40PY, quit smoking in her 54s.  More sob since 09/2015. Now at baseline.  CXR (11/2015) >> inc markings at bases CXR 11/2016 ) RLL PNA (she was admitted) PFT (02/2016)  FEV1  0.82 or 46%.  Alpha one level > N.   Plan : 1. Cont Breo. Neb meds were  time consuming (Pulmicort and duoneb).  2. Cont  Incruse 1 puff daily. Plan to take Breo and Incruse in the morning.  3. Hold off on neb meds. Has pulmicort and duoneb. May use duoneb q4 prn as well as alb MDI q4.  4. UTD with vaccine. Needs yearly flu shot. She got both pneumonia vaccines. 5. Had sig o2 desatn with ONO. She did NOT want to do PSG.  He wanted to hold off on oxygen for now until she gets more symptomatic.    Exertional dyspnea Recent sob since 09/2015 pna. Clinically improved and better. . Clinically  improved but had pneumonia in March 2018. Observe for now. SOB at baseline. F/U CXR in 12/2016.   Community acquired pneumonia of right middle lobe of lung ([place]) Recent RNA.  Repeat chest x-ray in April 2018. Status post Augmentin.  Return to clinic in 6 mos.      Monica Becton, MD 11/21/2016   5:15 PM [facility] and [place] Pager: [phone] Office: [phone], Fax: [phone]

## 2016-11-21 NOTE — Assessment & Plan Note (Signed)
Recent RNA.  Repeat chest x-ray in April 2018. Status post Augmentin.

## 2016-11-21 NOTE — Telephone Encounter (Signed)
Patient states she has a yeast infection. She would like to know if something could be called in for this. Please advise. Thank you.

## 2016-11-21 NOTE — Assessment & Plan Note (Signed)
Recent sob since 09/2015 pna. Clinically improved and better. . Clinically  improved but had pneumonia in March 2018. Observe for now. SOB at baseline. F/U CXR in 12/2016.

## 2016-11-21 NOTE — Telephone Encounter (Signed)
Sent in diflucan 1 pill to take for the yeast infection.

## 2016-11-21 NOTE — Assessment & Plan Note (Signed)
Pt with copd. 40PY, quit smoking in her 77s.  More sob since 09/2015. Now at baseline.  CXR (11/2015) >> inc markings at bases CXR 11/2016 ) RLL PNA (she was admitted) PFT (02/2016)  FEV1  0.82 or 46%.  Alpha one level > N.   Plan : 1. Cont Breo. Neb meds were  time consuming (Pulmicort and duoneb).  2. Cont  Incruse 1 puff daily. Plan to take Breo and Incruse in the morning.  3. Hold off on neb meds. Has pulmicort and duoneb. May use duoneb q4 prn as well as alb MDI q4.  4. UTD with vaccine. Needs yearly flu shot. She got both pneumonia vaccines. 5. Had sig o2 desatn with ONO. She did NOT want to do PSG.  He wanted to hold off on oxygen for now until she gets more symptomatic.

## 2016-11-22 ENCOUNTER — Encounter: Payer: Self-pay | Admitting: Physician Assistant

## 2016-11-22 ENCOUNTER — Ambulatory Visit (INDEPENDENT_AMBULATORY_CARE_PROVIDER_SITE_OTHER): Payer: Medicare HMO | Admitting: Physician Assistant

## 2016-11-22 VITALS — BP 106/62 | HR 80 | Resp 18 | Ht 60.0 in | Wt 119.0 lb

## 2016-11-22 DIAGNOSIS — R195 Other fecal abnormalities: Secondary | ICD-10-CM

## 2016-11-22 DIAGNOSIS — R197 Diarrhea, unspecified: Secondary | ICD-10-CM | POA: Diagnosis not present

## 2016-11-22 DIAGNOSIS — R1030 Lower abdominal pain, unspecified: Secondary | ICD-10-CM

## 2016-11-22 DIAGNOSIS — R194 Change in bowel habit: Secondary | ICD-10-CM | POA: Diagnosis not present

## 2016-11-22 MED ORDER — SACCHAROMYCES BOULARDII 250 MG PO CAPS: 250.0000 mg | ORAL_CAPSULE | Freq: Two times a day (BID) | ORAL | 0 refills | Status: DC

## 2016-11-22 NOTE — Patient Instructions (Signed)
We have sent the following medications to your pharmacy for you to pick up at your convenience:  Florastor  You have been scheduled for a CT scan of the abdomen and pelvis at [facility] ([address]---this is in the same building as [facility]).   You are scheduled on 11/29/2016 at 1:30pm. You should arrive 15 minutes prior to your appointment time for registration. Please follow the written instructions below on the day of your exam:  WARNING: IF YOU ARE ALLERGIC TO IODINE/X-RAY DYE, PLEASE NOTIFY RADIOLOGY IMMEDIATELY AT [phone]! YOU WILL BE GIVEN A 13 HOUR PREMEDICATION PREP.  1) Do not eat anything after 9:30am (4 hours prior to your test) 2) You have been given 2 bottles of oral contrast to drink. The solution may taste               better if refrigerated, but do NOT add ice or any other liquid to this solution. Shake well before drinking.    Drink 1 bottle of contrast @ 11:30am (2 hours prior to your exam)  Drink 1 bottle of contrast @ 12:30pm (1 hour prior to your exam)  You may take any medications as prescribed with a small amount of water except for the following: Metformin, Glucophage, Glucovance, Avandamet, Riomet, Fortamet, Actoplus Met, Janumet, Glumetza or Metaglip. The above medications must be held the day of the exam AND 48 hours after the exam.  The purpose of you drinking the oral contrast is to aid in the visualization of your intestinal tract. The contrast solution may cause some diarrhea. Before your exam is started, you will be given a small amount of fluid to drink. Depending on your individual set of symptoms, you may also receive an intravenous injection of x-ray contrast/dye. Plan on being at [facility] for 30 minutes or long, depending on the type of exam you are having performed.  If you have any questions regarding your exam or if you need to reschedule, you may call the CT department at [phone] between the hours of 8:00 am  and 5:00 pm, Monday-Friday.  ________________________________________________________________________

## 2016-11-22 NOTE — Progress Notes (Signed)
Subjective:    Patient ID: Sherry Lambert, female    DOB: [birth year], 76 y.o.   MRN: [id number]  HPI  Sherry Lambert is a pleasant 76 year old white female known to Dr. Silverio Decamp who is referred today by Dr. Titus Mould for evaluation of persistent diarrhea. She was recently hospitalized 42 through 11/14/2016 with a community-acquired pneumonia complicated by sepsis. She was treated with IV antibiotics and then discharged home on Augmentin. Patient also has history of COPD, adult-onset diabetes mellitus, history of CVA, peripheral vascular disease and hypertension. She states she had a remote colonoscopy 20+ years ago and had an EGD done by Dr. Silverio Decamp in February 2017 with balloon dilation of an esophageal stricture. Patient says that her diarrhea started prior to her hospitalization and had been present for a few weeks by the time she was hospitalized. She had not been on any other recent antibiotics. She says her stools were loose with a couple of bowel movements per day. Is no complaints of abdominal pain and had not had any bleeding. Her diarrhea did not worsen during hospitalization, but has continued with soft to loose stools once or twice daily. As the stool seems to be smaller in caliber "narrowed" when it has any form. Again no complaints of abdominal pain or rectal pain. Appetite has been good no nausea or vomiting. From a pulmonary standpoint she says she is back to baseline She  is not on any oxygen at home. She is very resistant to the idea of having a colonoscopy.  Review of Systems  Pertinent positive and negative review of systems were noted in the above HPI section.  All other review of systems was otherwise negative.  Outpatient Encounter Prescriptions as of 11/22/2016  Medication Sig  . aspirin EC 81 MG tablet Take 1 tablet (81 mg total) by mouth daily.  . budesonide (PULMICORT) 0.5 MG/2ML nebulizer solution Take 2 mLs (0.5 mg total) by nebulization 2 (two) times daily.  .  diphenhydrAMINE (BENADRYL) 25 MG tablet Take 25 mg by mouth every 6 (six) hours as needed for allergies.  . fluticasone furoate-vilanterol (BREO ELLIPTA) 200-25 MCG/INH AEPB Inhale 1 puff into the lungs daily.  Marland Kitchen ipratropium-albuterol (DUONEB) 0.5-2.5 (3) MG/3ML SOLN Take 3 mLs by nebulization 4 (four) times daily. And as needed (Patient taking differently: Take 3 mLs by nebulization 4 (four) times daily. For shortness of breath)  . lisinopril-hydrochlorothiazide (ZESTORETIC) 20-12.5 MG tablet Take 1 tablet by mouth daily.  Marland Kitchen omeprazole (PRILOSEC) 40 MG capsule Take 1 capsule (40 mg total) by mouth 2 (two) times daily before a meal. Take before breakfast and before dinner  . pravastatin (PRAVACHOL) 20 MG tablet Take 1 tablet (20 mg total) by mouth daily.  Marland Kitchen PROAIR HFA 108 (90 Base) MCG/ACT inhaler Inhale 2 puffs into the lungs every 6 (six) hours as needed for shortness of breath.   . umeclidinium bromide (INCRUSE ELLIPTA) 62.5 MCG/INH AEPB Inhale 1 puff into the lungs daily.  Marland Kitchen saccharomyces boulardii (FLORASTOR) 250 MG capsule Take 1 capsule (250 mg total) by mouth 2 (two) times daily.   No facility-administered encounter medications on file as of 11/22/2016.    Allergies  Allergen Reactions  . Lipitor [Atorvastatin] Itching   Patient Active Problem List   Diagnosis Date Noted  . Community acquired pneumonia of right middle lobe of lung ([place])   . Sepsis ([place])   . Community acquired pneumonia of left lower lobe of lung ([place])   . Septic shock ([place]) 11/09/2016  . PVD (  peripheral vascular disease) ([place]) 09/14/2016  . COPD (chronic obstructive pulmonary disease) ([place]) 12/01/2015  . Exertional dyspnea 12/01/2015  . Right hip pain 10/25/2015  . Nontraumatic subcortical hemorrhage of left cerebral hemisphere ([place]) 07/04/2015  . Essential hypertension 12/20/2014  . Intracerebral hemorrhage ([place]) 10/26/2014  . Routine general medical examination at a health care facility 10/22/2014  .  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 Peripheral arterial disease ([place]) 01/05/2014  . Osteopenia 11/12/2013  . Dyslipidemia 09/19/2013  . Diabetes mellitus type 2, controlled, 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[place]) 16/06/9603  . Other emphysema ([place]) 11/10/2012   Social History   Social History  . Marital status: Divorced    Spouse name: N/A  . Number of children: 1  . Years of education: N/A   Occupational History  . Retired    Social History Main Topics  . Smoking status: Former Smoker    Years: 55.00    Types: Cigarettes    Quit date: 10/19/2002  . Smokeless tobacco: Never Used  . Alcohol use No  . Drug use: No  . Sexual activity: No   Other Topics Concern  . Not on file   Social History Narrative   Regular exercise-no   Caffeine Use-yes    Ms. Eckels's family history includes Colon cancer in her sister; Diabetes in her father; Hypertension in her father.      Objective:    Vitals:   11/22/16 1342  BP: 106/62  Pulse: 80  Resp: 18    Physical Exam   well-developed older white female in no acute distress, pleasant blood pressure 106/62, pulse 80, height 5 foot, BMI 23.24 ambulating with a walker. HEENT; nontraumatic normocephalic PERRLA sclera anicteric, Cardiovascular; regular rate and rhythm with S1-S2 no murmur rub or gallop, Pulm; somewhat decreased breath sounds bilaterally, Abdomen ;soft minimal lower abdominal tenderness no guarding or rebound no palpable mass or hepatosplenomegaly bowel sounds are present, Rectal ;exam not done, Extremities; no clubbing cyanosis or edema skin warm and dry, Neuropsych; mood and affect appropriate, she does have some residual deficits in her extremities post-CVA       Assessment & Plan:   #32  76 year old white female with 6 week history of change in bowels with loose stools. She is not having severe diarrhea has no abdominal pain or cramping. Stools are semi-formed and narrower in  caliber. Etiology is not clear rule out occult colon lesion, rule out medication induced, #2 recent hospitalization with community-acquired pneumonia complicated by sepsis-change in bowel habits preceded this #3 peripheral vascular disease #4 history of CVA #5 adult-onset diabetes mellitus #6 COPD #7 history of esophageal stricture status post dilation 2017  Plan; start Florastor one by mouth twice a day Discussed colonoscopy with the patient, she is very reluctant and not a great colonoscopy candidate with COPD and recent hospitalization with pneumonia and sepsis. Also debilitated post-CVA. Will schedule for CT scan of the abdomen and pelvis to rule out any significant pathology, malignancy.   Amy Genia Harold PA-C 11/22/2016   Cc: [name], *

## 2016-11-27 ENCOUNTER — Telehealth: Payer: Self-pay | Admitting: Internal Medicine

## 2016-11-27 MED ORDER — FLUCONAZOLE 150 MG PO TABS: 150.0000 mg | ORAL_TABLET | Freq: Once | ORAL | 0 refills | Status: AC

## 2016-11-27 NOTE — Telephone Encounter (Signed)
Pt called in and said that she was give med for a yeast infection but it did not clear it up. She always has to have 2.  Can we send in another for her?    Pharmacy [facility] in [place]

## 2016-11-27 NOTE — Telephone Encounter (Signed)
Sent in

## 2016-11-27 NOTE — Progress Notes (Signed)
Reviewed and agree with documentation and assessment and plan. K. Veena Alisson Rozell , MD   

## 2016-11-29 ENCOUNTER — Ambulatory Visit: Payer: Medicare HMO | Admitting: Internal Medicine

## 2016-11-29 ENCOUNTER — Inpatient Hospital Stay: Admission: RE | Admit: 2016-11-29 | Payer: Medicare HMO | Source: Ambulatory Visit

## 2016-12-03 ENCOUNTER — Other Ambulatory Visit: Payer: Self-pay

## 2016-12-03 ENCOUNTER — Encounter: Payer: Self-pay | Admitting: Internal Medicine

## 2016-12-03 ENCOUNTER — Ambulatory Visit (INDEPENDENT_AMBULATORY_CARE_PROVIDER_SITE_OTHER): Payer: Medicare HMO | Admitting: Internal Medicine

## 2016-12-03 VITALS — BP 140/88 | HR 101 | Temp 98.6°F | Resp 18 | Ht 60.0 in | Wt 116.0 lb

## 2016-12-03 DIAGNOSIS — Z0000 Encounter for general adult medical examination without abnormal findings: Secondary | ICD-10-CM | POA: Diagnosis not present

## 2016-12-03 DIAGNOSIS — E118 Type 2 diabetes mellitus with unspecified complications: Secondary | ICD-10-CM

## 2016-12-03 DIAGNOSIS — I10 Essential (primary) hypertension: Secondary | ICD-10-CM | POA: Diagnosis not present

## 2016-12-03 DIAGNOSIS — E785 Hyperlipidemia, unspecified: Secondary | ICD-10-CM | POA: Diagnosis not present

## 2016-12-03 NOTE — Progress Notes (Signed)
   Subjective:    Patient ID: Sherry Lambert, female    DOB: [birth year], 76 y.o.   MRN: [id number]  HPI Here for medicare wellness and physical, no new complaints. Please see A/P for status and treatment of chronic medical problems.   Diet: DM since diabetic Physical activity: sedentary Depression/mood screen: negative Hearing: intact to whispered voice Visual acuity: grossly normal, performs annual eye exam Dr. Tenna Child for eye exam ADLs: capable, some assist with IADLs Fall risk: low to medium Home safety: good Cognitive evaluation: intact to orientation, naming, recall and repetition EOL planning: adv directives discussed  I have personally reviewed and have noted 1. The patient's medical and social history - reviewed today no changes 2. Their use of alcohol, tobacco or illicit drugs 3. Their current medications and supplements 4. The patient's functional ability including ADL's, fall risks, home safety risks and hearing or visual impairment. 5. Diet and physical activities 6. Evidence for depression or mood disorders 7. Care team reviewed and updated (available in snapshot)  Review of Systems  Constitutional: Positive for activity change. Negative for appetite change, chills, fatigue and fever.  HENT: Negative.   Eyes: Negative.   Respiratory: Positive for shortness of breath. Negative for cough and chest tightness.   Cardiovascular: Negative for chest pain, palpitations and leg swelling.  Gastrointestinal: Negative for abdominal distention, abdominal pain, constipation, diarrhea, nausea and vomiting.  Musculoskeletal: Positive for gait problem. Negative for arthralgias, back pain and myalgias.  Skin: Negative.   Neurological: Positive for numbness. Negative for dizziness, facial asymmetry and headaches.  Psychiatric/Behavioral: Negative.       Objective:   Physical Exam  Constitutional: She is oriented to person, place, and time. She appears well-developed and  well-nourished.  HENT:  Head: Normocephalic and atraumatic.  Eyes: EOM are normal.  Neck: Normal range of motion.  Cardiovascular: Normal rate and regular rhythm.   Pulmonary/Chest: Effort normal. No respiratory distress. She has no wheezes. She has no rales.  Abdominal: Soft. Bowel sounds are normal. She exhibits no distension. There is no tenderness. There is no rebound.  Musculoskeletal: She exhibits no edema.  Neurological: She is alert and oriented to person, place, and time.  Stable numbness, uses wheeled walker for ambulation  Skin: Skin is warm and dry.  Foot exam done   Vitals:   12/03/16 1502 12/03/16 1555  BP: (!) 172/78 140/88  Pulse: (!) 101   Resp: 18   Temp: 98.6 F (37 C)   TempSrc: Oral   SpO2: 97%   Weight: 116 lb (52.6 kg)   Height: 5' (1.524 m)       Assessment & Plan:

## 2016-12-03 NOTE — Patient Instructions (Addendum)
Try finding out if senior wheels can take you to doctor visits. They only operate 2 days per month but you could use it for a mammogram.   Health Maintenance, Female Adopting a healthy lifestyle and getting preventive care can go a long way to promote health and wellness. Talk with your health care provider about what schedule of regular examinations is right for you. This is a good chance for you to check in with your provider about disease prevention and staying healthy. In between checkups, there are plenty of things you can do on your own. Experts have done a lot of research about which lifestyle changes and preventive measures are most likely to keep you healthy. Ask your health care provider for more information. Weight and diet Eat a healthy diet  Be sure to include plenty of vegetables, fruits, low-fat dairy products, and lean protein.  Do not eat a lot of foods high in solid fats, added sugars, or salt.  Get regular exercise. This is one of the most important things you can do for your health.  Most adults should exercise for at least 150 minutes each week. The exercise should increase your heart rate and make you sweat (moderate-intensity exercise).  Most adults should also do strengthening exercises at least twice a week. This is in addition to the moderate-intensity exercise. Maintain a healthy weight  Body mass index (BMI) is a measurement that can be used to identify possible weight problems. It estimates body fat based on height and weight. Your health care provider can help determine your BMI and help you achieve or maintain a healthy weight.  For females 37 years of age and older:  A BMI below 18.5 is considered underweight.  A BMI of 18.5 to 24.9 is normal.  A BMI of 25 to 29.9 is considered overweight.  A BMI of 30 and above is considered obese. Watch levels of cholesterol and blood lipids  You should start having your blood tested for lipids and cholesterol at 75  years of age, then have this test every 5 years.  You may need to have your cholesterol levels checked more often if:  Your lipid or cholesterol levels are high.  You are older than 75 years of age.  You are at high risk for heart disease. Cancer screening Lung Cancer  Lung cancer screening is recommended for adults 26-32 years old who are at high risk for lung cancer because of a history of smoking.  A yearly low-dose CT scan of the lungs is recommended for people who:  Currently smoke.  Have quit within the past 15 years.  Have at least a 30-pack-year history of smoking. A pack year is smoking an average of one pack of cigarettes a day for 1 year.  Yearly screening should continue until it has been 15 years since you quit.  Yearly screening should stop if you develop a health problem that would prevent you from having lung cancer treatment. Breast Cancer  Practice breast self-awareness. This means understanding how your breasts normally appear and feel.  It also means doing regular breast self-exams. Let your health care provider know about any changes, no matter how small.  If you are in your 20s or 30s, you should have a clinical breast exam (CBE) by a health care provider every 1-3 years as part of a regular health exam.  If you are 19 or older, have a CBE every year. Also consider having a breast X-ray (mammogram) every year.  If you have a family history of breast cancer, talk to your health care provider about genetic screening.  If you are at high risk for breast cancer, talk to your health care provider about having an MRI and a mammogram every year.  Breast cancer gene (BRCA) assessment is recommended for women who have family members with BRCA-related cancers. BRCA-related cancers include:  Breast.  Ovarian.  Tubal.  Peritoneal cancers.  Results of the assessment will determine the need for genetic counseling and BRCA1 and BRCA2 testing. Cervical Cancer   Your health care provider may recommend that you be screened regularly for cancer of the pelvic organs (ovaries, uterus, and vagina). This screening involves a pelvic examination, including checking for microscopic changes to the surface of your cervix (Pap test). You may be encouraged to have this screening done every 3 years, beginning at age 47.  For women ages 55-65, health care providers may recommend pelvic exams and Pap testing every 3 years, or they may recommend the Pap and pelvic exam, combined with testing for human papilloma virus (HPV), every 5 years. Some types of HPV increase your risk of cervical cancer. Testing for HPV may also be done on women of any age with unclear Pap test results.  Other health care providers may not recommend any screening for nonpregnant women who are considered low risk for pelvic cancer and who do not have symptoms. Ask your health care provider if a screening pelvic exam is right for you.  If you have had past treatment for cervical cancer or a condition that could lead to cancer, you need Pap tests and screening for cancer for at least 20 years after your treatment. If Pap tests have been discontinued, your risk factors (such as having a new sexual partner) need to be reassessed to determine if screening should resume. Some women have medical problems that increase the chance of getting cervical cancer. In these cases, your health care provider may recommend more frequent screening and Pap tests. Colorectal Cancer  This type of cancer can be detected and often prevented.  Routine colorectal cancer screening usually begins at 75 years of age and continues through 75 years of age.  Your health care provider may recommend screening at an earlier age if you have risk factors for colon cancer.  Your health care provider may also recommend using home test kits to check for hidden blood in the stool.  A small camera at the end of a tube can be used to examine  your colon directly (sigmoidoscopy or colonoscopy). This is done to check for the earliest forms of colorectal cancer.  Routine screening usually begins at age 63.  Direct examination of the colon should be repeated every 5-10 years through 75 years of age. However, you may need to be screened more often if early forms of precancerous polyps or small growths are found. Skin Cancer  Check your skin from head to toe regularly.  Tell your health care provider about any new moles or changes in moles, especially if there is a change in a mole's shape or color.  Also tell your health care provider if you have a mole that is larger than the size of a Larock eraser.  Always use sunscreen. Apply sunscreen liberally and repeatedly throughout the day.  Protect yourself by wearing long sleeves, pants, a wide-brimmed hat, and sunglasses whenever you are outside. Heart disease, diabetes, and high blood pressure  High blood pressure causes heart disease and increases the risk  of stroke. High blood pressure is more likely to develop in:  People who have blood pressure in the high end of the normal range (130-139/85-89 mm Hg).  People who are overweight or obese.  People who are African American.  If you are 24-83 years of age, have your blood pressure checked every 3-5 years. If you are 53 years of age or older, have your blood pressure checked every year. You should have your blood pressure measured twice-once when you are at a hospital or clinic, and once when you are not at a hospital or clinic. Record the average of the two measurements. To check your blood pressure when you are not at a hospital or clinic, you can use:  An automated blood pressure machine at a pharmacy.  A home blood pressure monitor.  If you are between 15 years and 54 years old, ask your health care provider if you should take aspirin to prevent strokes.  Have regular diabetes screenings. This involves taking a blood sample  to check your fasting blood sugar level.  If you are at a normal weight and have a low risk for diabetes, have this test once every three years after 75 years of age.  If you are overweight and have a high risk for diabetes, consider being tested at a younger age or more often. Preventing infection Hepatitis B  If you have a higher risk for hepatitis B, you should be screened for this virus. You are considered at high risk for hepatitis B if:  You were born in a country where hepatitis B is common. Ask your health care provider which countries are considered high risk.  Your parents were born in a high-risk country, and you have not been immunized against hepatitis B (hepatitis B vaccine).  You have HIV or AIDS.  You use needles to inject street drugs.  You live with someone who has hepatitis B.  You have had sex with someone who has hepatitis B.  You get hemodialysis treatment.  You take certain medicines for conditions, including cancer, organ transplantation, and autoimmune conditions. Hepatitis C  Blood testing is recommended for:  Everyone born from [birth year] through 1964.  Anyone with known risk factors for hepatitis C. Sexually transmitted infections (STIs)  You should be screened for sexually transmitted infections (STIs) including gonorrhea and chlamydia if:  You are sexually active and are younger than 75 years of age.  You are older than 75 years of age and your health care provider tells you that you are at risk for this type of infection.  Your sexual activity has changed since you were last screened and you are at an increased risk for chlamydia or gonorrhea. Ask your health care provider if you are at risk.  If you do not have HIV, but are at risk, it may be recommended that you take a prescription medicine daily to prevent HIV infection. This is called pre-exposure prophylaxis (PrEP). You are considered at risk if:  You are sexually active and do not regularly  use condoms or know the HIV status of your partner(s).  You take drugs by injection.  You are sexually active with a partner who has HIV. Talk with your health care provider about whether you are at high risk of being infected with HIV. If you choose to begin PrEP, you should first be tested for HIV. You should then be tested every 3 months for as long as you are taking PrEP. Pregnancy  If you are  premenopausal and you may become pregnant, ask your health care provider about preconception counseling.  If you may become pregnant, take 400 to 800 micrograms (mcg) of folic acid every day.  If you want to prevent pregnancy, talk to your health care provider about birth control (contraception). Osteoporosis and menopause  Osteoporosis is a disease in which the bones lose minerals and strength with aging. This can result in serious bone fractures. Your risk for osteoporosis can be identified using a bone density scan.  If you are 58 years of age or older, or if you are at risk for osteoporosis and fractures, ask your health care provider if you should be screened.  Ask your health care provider whether you should take a calcium or vitamin D supplement to lower your risk for osteoporosis.  Menopause may have certain physical symptoms and risks.  Hormone replacement therapy may reduce some of these symptoms and risks. Talk to your health care provider about whether hormone replacement therapy is right for you. Follow these instructions at home:  Schedule regular health, dental, and eye exams.  Stay current with your immunizations.  Do not use any tobacco products including cigarettes, chewing tobacco, or electronic cigarettes.  If you are pregnant, do not drink alcohol.  If you are breastfeeding, limit how much and how often you drink alcohol.  Limit alcohol intake to no more than 1 drink per day for nonpregnant women. One drink equals 12 ounces of beer, 5 ounces of wine, or 1 ounces of  hard liquor.  Do not use street drugs.  Do not share needles.  Ask your health care provider for help if you need support or information about quitting drugs.  Tell your health care provider if you often feel depressed.  Tell your health care provider if you have ever been abused or do not feel safe at home. This information is not intended to replace advice given to you by your health care provider. Make sure you discuss any questions you have with your health care provider. Document Released: 03/05/2011 Document Revised: 01/26/2016 Document Reviewed: 05/24/2015 Elsevier Interactive Patient Education  2017 [employer].

## 2016-12-04 NOTE — Assessment & Plan Note (Signed)
Taking pravastatin 20 mg daily and recent lipid panel at goal of <100.

## 2016-12-04 NOTE — Assessment & Plan Note (Signed)
Wants to defer labs today given so much blood work while in the hospital. Foot exam done with stable neuropathy. Also with PVD and hx stroke which complicate her diabetes. Overall at goal on her diet and exercise only.

## 2016-12-04 NOTE — Assessment & Plan Note (Signed)
Mammogram not up to date and she declines due to not having ride. Colonoscopy is not done and she declines and also declines the cologuard. Tetanus and flu and pneumonia are up to date. Counseled about sun safety and mole surveillance. Counseled about the importance of taking her meds all the time to avoid another stroke in the future. Given 10 year screening recommendations.

## 2016-12-04 NOTE — Assessment & Plan Note (Signed)
BP at goal on her lisinopril/hctz and will continue. Previous BMP okay to continue without changes.

## 2016-12-05 ENCOUNTER — Ambulatory Visit (INDEPENDENT_AMBULATORY_CARE_PROVIDER_SITE_OTHER)
Admission: RE | Admit: 2016-12-05 | Discharge: 2016-12-05 | Disposition: A | Discharge status: Home / Self Care | Payer: Medicare HMO | Source: Ambulatory Visit | Attending: Physician Assistant | Admitting: Physician Assistant

## 2016-12-05 DIAGNOSIS — R195 Other fecal abnormalities: Secondary | ICD-10-CM

## 2016-12-05 DIAGNOSIS — R194 Change in bowel habit: Secondary | ICD-10-CM

## 2016-12-05 DIAGNOSIS — R1030 Lower abdominal pain, unspecified: Secondary | ICD-10-CM

## 2016-12-05 DIAGNOSIS — R197 Diarrhea, unspecified: Secondary | ICD-10-CM

## 2016-12-05 DIAGNOSIS — R109 Unspecified abdominal pain: Secondary | ICD-10-CM | POA: Diagnosis not present

## 2016-12-13 ENCOUNTER — Other Ambulatory Visit: Payer: Self-pay | Admitting: *Deleted

## 2016-12-13 MED ORDER — LISINOPRIL-HYDROCHLOROTHIAZIDE 20-12.5 MG PO TABS: 1.0000 | ORAL_TABLET | Freq: Every day | ORAL | 1 refills | Status: DC

## 2016-12-14 ENCOUNTER — Telehealth: Payer: Self-pay | Admitting: Internal Medicine

## 2016-12-14 ENCOUNTER — Ambulatory Visit: Payer: Medicare HMO | Admitting: Adult Health

## 2016-12-14 NOTE — Telephone Encounter (Signed)
Patient stated its her feet that are swollen but I advised that she should still decrease salt intake and to elevate feet

## 2016-12-14 NOTE — Telephone Encounter (Signed)
Should not take fluid pill for hand swelling. Decrease salt in diet and work on elevating the hand while resting above the level of the heart.

## 2016-12-14 NOTE — Telephone Encounter (Signed)
Pt called in and said that right hand is swollen and wants to know if she can go back to taking the water pill to help with the tightness of her hand?   Best number [phone]

## 2016-12-18 ENCOUNTER — Telehealth: Payer: Self-pay | Admitting: *Deleted

## 2016-12-18 NOTE — Telephone Encounter (Signed)
Okay to keep lisinopril/hctz

## 2016-12-18 NOTE — Telephone Encounter (Signed)
Notified pt w/MD response.../lmb 

## 2016-12-18 NOTE — Telephone Encounter (Signed)
Rec'd call pt states she saw MD couple weeks ago and was told just to take the plain Lisinopril. Per chart in MD notes from 4/2, MD states for pt to continue taking Lisinopril/HCTZ. Pls advise which lisinopril pt should be taking she is needing rx sent to [facility]....[facility]

## 2016-12-19 ENCOUNTER — Telehealth: Payer: Self-pay

## 2016-12-19 ENCOUNTER — Other Ambulatory Visit: Payer: Self-pay

## 2016-12-19 DIAGNOSIS — R933 Abnormal findings on diagnostic imaging of other parts of digestive tract: Secondary | ICD-10-CM

## 2016-12-19 NOTE — Telephone Encounter (Signed)
Left her a message. She was already aware of the date. She will need a pre-visit for her instructions.

## 2016-12-19 NOTE — Telephone Encounter (Signed)
Ok, thanks.

## 2016-12-19 NOTE — Telephone Encounter (Signed)
-----   Message from Mauri Pole, MD sent at 12/07/2016  5:42 PM EDT ----- She will need colonoscopy for further evaluation. Please discuss results of CT with patient and see if she agrees to do the procedure. Thanks

## 2016-12-19 NOTE — Telephone Encounter (Signed)
I have spoken with the patient. She is agreeable to going forward with her colonoscopy. I can put her on the hospital schedule 01/18/17 if that is okay.

## 2016-12-20 NOTE — Telephone Encounter (Signed)
Contacted the patient. Scheduled for 12/26/16 at 8:30 am for her pre-visit.

## 2016-12-26 ENCOUNTER — Ambulatory Visit (AMBULATORY_SURGERY_CENTER): Payer: Self-pay

## 2016-12-26 VITALS — Ht 60.0 in | Wt 118.2 lb

## 2016-12-26 DIAGNOSIS — Z800 Family history of malignant neoplasm of digestive organs: Secondary | ICD-10-CM

## 2016-12-26 MED ORDER — SUPREP BOWEL PREP KIT 17.5-3.13-1.6 GM/177ML PO SOLN: 1.0000 | Freq: Once | ORAL | 0 refills | Status: AC

## 2016-12-26 NOTE — Progress Notes (Signed)
No allergies to eggs or soy No diet meds No home oxygen No past problems with anesthesia  No internet 

## 2017-01-15 ENCOUNTER — Encounter (HOSPITAL_COMMUNITY): Payer: Self-pay

## 2017-01-16 ENCOUNTER — Telehealth: Payer: Self-pay | Admitting: Pulmonary Disease

## 2017-01-16 MED ORDER — FLUTICASONE FUROATE-VILANTEROL 200-25 MCG/INH IN AEPB: 1.0000 | INHALATION_SPRAY | Freq: Every day | RESPIRATORY_TRACT | 3 refills | Status: DC

## 2017-01-16 NOTE — Telephone Encounter (Signed)
Pt requesting refill on breo.  This has been sent to preferred pharmacy.  Nothing further needed.  

## 2017-01-17 ENCOUNTER — Other Ambulatory Visit (INDEPENDENT_AMBULATORY_CARE_PROVIDER_SITE_OTHER): Payer: Medicare HMO

## 2017-01-17 ENCOUNTER — Ambulatory Visit (INDEPENDENT_AMBULATORY_CARE_PROVIDER_SITE_OTHER)
Admission: RE | Admit: 2017-01-17 | Discharge: 2017-01-17 | Disposition: A | Discharge status: Home / Self Care | Payer: Medicare HMO | Source: Ambulatory Visit | Attending: Pulmonary Disease | Admitting: Pulmonary Disease

## 2017-01-17 ENCOUNTER — Ambulatory Visit (INDEPENDENT_AMBULATORY_CARE_PROVIDER_SITE_OTHER): Payer: Medicare HMO | Admitting: Pulmonary Disease

## 2017-01-17 ENCOUNTER — Encounter: Payer: Self-pay | Admitting: Pulmonary Disease

## 2017-01-17 ENCOUNTER — Telehealth: Payer: Self-pay | Admitting: Gastroenterology

## 2017-01-17 DIAGNOSIS — J189 Pneumonia, unspecified organism: Secondary | ICD-10-CM | POA: Diagnosis not present

## 2017-01-17 DIAGNOSIS — J181 Lobar pneumonia, unspecified organism: Secondary | ICD-10-CM

## 2017-01-17 DIAGNOSIS — J430 Unilateral pulmonary emphysema [MacLeod's syndrome]: Secondary | ICD-10-CM | POA: Diagnosis not present

## 2017-01-17 LAB — BASIC METABOLIC PANEL
BUN: 20 mg/dL (ref 6–23)
CHLORIDE: 102 meq/L (ref 96–112)
CO2: 28 meq/L (ref 19–32)
Calcium: 10.6 mg/dL — ABNORMAL HIGH (ref 8.4–10.5)
Creatinine, Ser: 1.02 mg/dL (ref 0.40–1.20)
GFR: 56.21 mL/min — ABNORMAL LOW (ref >60.00)
Glucose, Bld: 114 mg/dL — ABNORMAL HIGH (ref 70–99)
POTASSIUM: 4.7 meq/L (ref 3.5–5.1)
Sodium: 138 mEq/L (ref 135–145)

## 2017-01-17 LAB — CBC WITH DIFFERENTIAL/PLATELET
BASOS ABS: 0.1 10*3/uL (ref 0.0–0.1)
Basophils Relative: 0.9 % (ref 0.0–3.0)
EOS ABS: 0.1 10*3/uL (ref 0.0–0.7)
Eosinophils Relative: 0.8 % (ref 0.0–5.0)
HEMATOCRIT: 42.5 % (ref 36.0–46.0)
Hemoglobin: 13.9 g/dL (ref 12.0–15.0)
LYMPHS PCT: 25.5 % (ref 12.0–46.0)
Lymphs Abs: 2.7 10*3/uL (ref 0.7–4.0)
MCHC: 32.6 g/dL (ref 30.0–36.0)
MCV: 88.7 fl (ref 78.0–100.0)
MONOS PCT: 7.3 % (ref 3.0–12.0)
Monocytes Absolute: 0.8 10*3/uL (ref 0.1–1.0)
NEUTROS ABS: 6.9 10*3/uL (ref 1.4–7.7)
Neutrophils Relative %: 65.5 % (ref 43.0–77.0)
PLATELETS: 286 10*3/uL (ref 150.0–400.0)
RBC: 4.79 Mil/uL (ref 3.87–5.11)
RDW: 14 % (ref 11.5–15.5)
WBC: 10.5 10*3/uL (ref 4.0–10.5)

## 2017-01-17 MED ORDER — UMECLIDINIUM BROMIDE 62.5 MCG/INH IN AEPB: 1.0000 | INHALATION_SPRAY | Freq: Every day | RESPIRATORY_TRACT | 11 refills | Status: DC

## 2017-01-17 MED ORDER — FLUTICASONE FUROATE-VILANTEROL 200-25 MCG/INH IN AEPB: 1.0000 | INHALATION_SPRAY | Freq: Every day | RESPIRATORY_TRACT | 6 refills | Status: DC

## 2017-01-17 NOTE — Progress Notes (Signed)
Subjective:    Patient ID: Sherry Lambert, female    DOB: 03-04-1941, 76 y.o.   MRN: [id number]  HPI   This is the case of Sherry Lambert, 76 y.o. Female, who was referred by Dr. [name]  in consultation regarding SOB.   As you very well know, patient has a 4 PY smoking history, quit when she was in her 60s.  Pt was dxed with copd couple yrs ago. She is on advair 100/50 1P BID. Using it x 3 yrs.  She was dxed with PNA in 09/2015. Was slow to recover. Now at baseline.   DATA PFT (02/13/16)  FEV1/FVC 45%, FEV1  0.82  46%. No hyperinflation. DLCO  51%  ROV (02/13/16)  Pt returns to office as f/u. Started on neb meds < 1 week. No recnt infxn or abx. Pulled L chest muscle last week. She feels better on Breo. Neb meds are too time consuming.  SOB better now the last week.  Has not been admitted nor has been on abx since last seen.   ROV 05/23/16. Patient returns to office as follow-up on her COPD. She continues to feel better with Breo and Incruse. She wonders if incruse is making a difference > she sometimes forget to use it. SOB at baseline.  She will be in a doughnut hole soon > wants to hold off on O2 at HS.   ROV 11/21/2016 Patient returns to the office as follow-up on her COPD. Since last seen, her COPD has been doing well until the last week. She had worsening dyspnea with worsening diarrhea for which she ended up being admitted last Friday for 5 days. She was diagnosed with shock likely septic and hypovolemic. She was given broad-spectrum antibiotics she quickly improved. She was discharged to finish Augmentin for 7 days. Almost back at her baseline. Diarrhea significantly improved.  ROV 01/17/2017 Patient is urgently being seen for increased SOB from baseline.   She was at her baseline then inc SOB x 2 days + "feeling warm".  Family concerned as she was diagnosed with severe PNA in 11/2016.  Uses MDIs. (-) diarrhea.     Review of Systems  Constitutional: Negative.  Negative for  fever and unexpected weight change.  HENT: Negative for congestion, dental problem, ear pain, nosebleeds, postnasal drip, rhinorrhea, sinus pressure, sneezing, sore throat and trouble swallowing.   Eyes: Negative.  Negative for redness and itching.  Respiratory: Positive for shortness of breath. Negative for cough, chest tightness and wheezing.   Cardiovascular: Negative for palpitations and leg swelling.  Gastrointestinal: Negative.  Negative for nausea and vomiting.  Endocrine: Negative.   Genitourinary: Negative.  Negative for dysuria.  Musculoskeletal: Positive for arthralgias. Negative for joint swelling.  Skin: Negative for rash.  Allergic/Immunologic: Positive for environmental allergies.  Neurological: Negative.  Negative for headaches.  Hematological: Bruises/bleeds easily.  Psychiatric/Behavioral: Negative.  Negative for dysphoric mood. The patient is not nervous/anxious.           Objective:   Physical Exam   Vitals:  Vitals:   01/17/17 1528  BP: 110/72  Pulse: 93  SpO2: 98%  Weight: 116 lb 9.6 oz (52.9 kg)  Height: 5' (1.524 m)    Constitutional/General:  Pleasant, well-nourished, well-developed, not in any distress,  Comfortably seating.  Well kempt  Body mass index is 22.77 kg/m. Wt Readings from Last 3 Encounters:  01/17/17 116 lb 9.6 oz (52.9 kg)  12/26/16 118 lb 3.2 oz (53.6 kg)  12/03/16 116  lb (52.6 kg)    HEENT: Pupils equal and reactive to light and accommodation. Anicteric sclerae. Normal nasal mucosa.   No oral  lesions,  mouth clear,  oropharynx clear, no postnasal drip. (-) Oral thrush. No dental caries.  Airway - Mallampati class III  Neck: No masses. Midline trachea. No JVD, (-) LAD. (-) bruits appreciated.  Respiratory/Chest: Grossly normal chest. (-) deformity. (-) Accessory muscle use.  Symmetric expansion. (-) Tenderness on palpation.  Resonant on percussion.  Diminished BS on both lower lung zones. (-)rhonchi, (+) crackles at  bases. (-) wheezing  (-) egophony  Cardiovascular: Regular rate and  rhythm, heart sounds normal, no murmur or gallops, no peripheral edema  Gastrointestinal:  Normal bowel sounds. Soft, non-tender. No hepatosplenomegaly.  (-) masses.   Musculoskeletal:  Normal muscle tone. Normal gait. Has a cast in RLE 2/2 recent plantar fasciitis.   Extremities: Grossly normal. (-) clubbing, cyanosis.  (-) edema  Skin: (-) rash,lesions seen.   Neurological/Psychiatric : alert, oriented to time, place, person. Normal mood and affect          Assessment & Plan:  COPD (chronic obstructive pulmonary disease) ([place]) Pt with copd. 40PY, quit smoking in her 75s.  More sob since 09/2015. Now at baseline except x 2 days. Mildly worse.  CXR (01/2017) >> resolution of PNA and effusion seen in 11/2016 CXR PFT (02/2016)  FEV1  0.82 or 46%.  Alpha one level > N.   Plan : 1. Cont Breo. Neb meds were  time consuming (Pulmicort and duoneb) and she only uses neb meds if she is more symptomatic. I encouraged her to try Duoneb BID and see if it helps with recent SOB.   2. Cont  Incruse 1 puff daily. Plan to take Breo and Incruse in the morning.  3. UTD with vaccine. Lambert yearly flu shot. She got both pneumonia vaccines. 5. Had sig o2 desatn with ONO. She did NOT want to do PSG.  She wanted to hold off on oxygen for now until she gets more symptomatic. 6. No wheezing on exam today. Will hold off on steroids.  7. Hold off on abx.  Told son and pt to keep [place] posted if she gets "sicker" in the next few days.    Community acquired pneumonia of right middle lobe of lung ([place]) Recent RNA.  Repeat chest x-ray in April 2018 not done so we repeated CXR on 01/2017. >> Resolution of PNA. Status post Augmentin in 12/2016.   Return to clinic as scheduled.      Monica Becton, MD 01/17/2017   10:59 PM [facility] [place] Pager: [phone] Office: [phone], Fax: [phone]

## 2017-01-17 NOTE — Assessment & Plan Note (Addendum)
Recent RNA.  Repeat chest x-ray in April 2018 not done so we repeated CXR on 01/2017. >> Resolution of PNA. Status post Augmentin in 12/2016.

## 2017-01-17 NOTE — Telephone Encounter (Signed)
Patient is feeling short of breath for the past 48 hours. She will be seen by her pulmonologist today at 3 pm. Colonoscopy cancelled.

## 2017-01-17 NOTE — Assessment & Plan Note (Addendum)
Pt with copd. 40PY, quit smoking in her 69s.  More sob since 09/2015. Now at baseline except x 2 days. Mildly worse.  CXR (01/2017) >> resolution of PNA and effusion seen in 11/2016 CXR PFT (02/2016)  FEV1  0.82 or 46%.  Alpha one level > N.   Plan : 1. Cont Breo. Neb meds were  time consuming (Pulmicort and duoneb) and she only uses neb meds if she is more symptomatic. I encouraged her to try Duoneb BID and see if it helps with recent SOB.   2. Cont  Incruse 1 puff daily. Plan to take Breo and Incruse in the morning.  3. UTD with vaccine. Needs yearly flu shot. She got both pneumonia vaccines. 5. Had sig o2 desatn with ONO. She did NOT want to do PSG.  She wanted to hold off on oxygen for now until she gets more symptomatic. 6. No wheezing on exam today. Will hold off on steroids.  7. Hold off on abx.  Told son and pt to keep [place] posted if she gets "sicker" in the next few days.

## 2017-01-17 NOTE — Patient Instructions (Addendum)
It was a pleasure taking care of you today!   We will obtain blood work (CBC, BMP). Call us if breathing gets worse.   Cont breo and incruse daily.   Please call the office your symptoms are getting worse despite the meds/antibiotics.   F/U as scheduled.

## 2017-01-18 ENCOUNTER — Ambulatory Visit (HOSPITAL_COMMUNITY): Admission: RE | Admit: 2017-01-18 | Payer: Medicare HMO | Source: Ambulatory Visit | Admitting: Gastroenterology

## 2017-01-18 SURGERY — COLONOSCOPY WITH PROPOFOL
Anesthesia: Monitor Anesthesia Care

## 2017-01-21 ENCOUNTER — Telehealth: Payer: Self-pay | Admitting: Gastroenterology

## 2017-01-22 NOTE — Telephone Encounter (Signed)
Wants to reschedule Hospital Procedure to any other day.

## 2017-01-23 ENCOUNTER — Other Ambulatory Visit: Payer: Self-pay

## 2017-01-23 DIAGNOSIS — Z1211 Encounter for screening for malignant neoplasm of colon: Secondary | ICD-10-CM

## 2017-01-23 NOTE — Telephone Encounter (Signed)
Rescheduled to 03/05/17. Patient agrees to this date. New instructions mailed to her.

## 2017-02-27 ENCOUNTER — Encounter (HOSPITAL_COMMUNITY): Payer: Self-pay | Admitting: *Deleted

## 2017-03-05 ENCOUNTER — Other Ambulatory Visit: Payer: Self-pay

## 2017-03-05 ENCOUNTER — Ambulatory Visit (HOSPITAL_COMMUNITY): Payer: Medicare HMO | Admitting: Anesthesiology

## 2017-03-05 ENCOUNTER — Encounter (HOSPITAL_COMMUNITY)
Admission: RE | Disposition: A | Discharge status: Home / Self Care | Payer: Self-pay | Source: Ambulatory Visit | Attending: Gastroenterology

## 2017-03-05 ENCOUNTER — Ambulatory Visit (HOSPITAL_COMMUNITY)
Admission: RE | Admit: 2017-03-05 | Discharge: 2017-03-05 | Disposition: A | Discharge status: Home / Self Care | Payer: Medicare HMO | Source: Ambulatory Visit | Attending: Gastroenterology | Admitting: Gastroenterology

## 2017-03-05 ENCOUNTER — Encounter (HOSPITAL_COMMUNITY): Payer: Self-pay

## 2017-03-05 DIAGNOSIS — K6389 Other specified diseases of intestine: Secondary | ICD-10-CM | POA: Diagnosis not present

## 2017-03-05 DIAGNOSIS — D128 Benign neoplasm of rectum: Secondary | ICD-10-CM | POA: Diagnosis not present

## 2017-03-05 DIAGNOSIS — K6289 Other specified diseases of anus and rectum: Secondary | ICD-10-CM | POA: Diagnosis not present

## 2017-03-05 DIAGNOSIS — Z888 Allergy status to other drugs, medicaments and biological substances status: Secondary | ICD-10-CM | POA: Diagnosis not present

## 2017-03-05 DIAGNOSIS — Z1211 Encounter for screening for malignant neoplasm of colon: Secondary | ICD-10-CM | POA: Diagnosis not present

## 2017-03-05 DIAGNOSIS — Z79899 Other long term (current) drug therapy: Secondary | ICD-10-CM | POA: Diagnosis not present

## 2017-03-05 DIAGNOSIS — R194 Change in bowel habit: Secondary | ICD-10-CM

## 2017-03-05 DIAGNOSIS — E1151 Type 2 diabetes mellitus with diabetic peripheral angiopathy without gangrene: Secondary | ICD-10-CM | POA: Insufficient documentation

## 2017-03-05 DIAGNOSIS — E1122 Type 2 diabetes mellitus with diabetic chronic kidney disease: Secondary | ICD-10-CM | POA: Insufficient documentation

## 2017-03-05 DIAGNOSIS — D120 Benign neoplasm of cecum: Secondary | ICD-10-CM | POA: Insufficient documentation

## 2017-03-05 DIAGNOSIS — K219 Gastro-esophageal reflux disease without esophagitis: Secondary | ICD-10-CM | POA: Diagnosis not present

## 2017-03-05 DIAGNOSIS — Z7982 Long term (current) use of aspirin: Secondary | ICD-10-CM | POA: Diagnosis not present

## 2017-03-05 DIAGNOSIS — Z87891 Personal history of nicotine dependence: Secondary | ICD-10-CM | POA: Insufficient documentation

## 2017-03-05 DIAGNOSIS — I129 Hypertensive chronic kidney disease with stage 1 through stage 4 chronic kidney disease, or unspecified chronic kidney disease: Secondary | ICD-10-CM | POA: Insufficient documentation

## 2017-03-05 DIAGNOSIS — N189 Chronic kidney disease, unspecified: Secondary | ICD-10-CM | POA: Insufficient documentation

## 2017-03-05 DIAGNOSIS — I6930 Unspecified sequelae of cerebral infarction: Secondary | ICD-10-CM | POA: Insufficient documentation

## 2017-03-05 DIAGNOSIS — R195 Other fecal abnormalities: Secondary | ICD-10-CM | POA: Insufficient documentation

## 2017-03-05 DIAGNOSIS — E785 Hyperlipidemia, unspecified: Secondary | ICD-10-CM | POA: Diagnosis not present

## 2017-03-05 DIAGNOSIS — R935 Abnormal findings on diagnostic imaging of other abdominal regions, including retroperitoneum: Secondary | ICD-10-CM | POA: Diagnosis not present

## 2017-03-05 DIAGNOSIS — D126 Benign neoplasm of colon, unspecified: Secondary | ICD-10-CM

## 2017-03-05 DIAGNOSIS — I739 Peripheral vascular disease, unspecified: Secondary | ICD-10-CM | POA: Diagnosis not present

## 2017-03-05 DIAGNOSIS — J439 Emphysema, unspecified: Secondary | ICD-10-CM | POA: Insufficient documentation

## 2017-03-05 HISTORY — PX: COLONOSCOPY WITH PROPOFOL: SHX5780

## 2017-03-05 LAB — BASIC METABOLIC PANEL
Anion gap: 9 (ref 5–15)
BUN: 24 mg/dL — AB (ref 6–20)
CALCIUM: 9 mg/dL (ref 8.9–10.3)
CHLORIDE: 101 mmol/L (ref 101–111)
CO2: 25 mmol/L (ref 22–32)
CREATININE: 1.11 mg/dL — AB (ref 0.44–1.00)
GFR calc Af Amer: 55 mL/min — ABNORMAL LOW (ref >60)
GFR calc non Af Amer: 48 mL/min — ABNORMAL LOW (ref >60)
Glucose, Bld: 125 mg/dL — ABNORMAL HIGH (ref 65–99)
Potassium: 4.2 mmol/L (ref 3.5–5.1)
SODIUM: 135 mmol/L (ref 135–145)

## 2017-03-05 SURGERY — COLONOSCOPY WITH PROPOFOL
Anesthesia: Monitor Anesthesia Care

## 2017-03-05 MED ORDER — SODIUM CHLORIDE 0.9 % IV SOLN: INTRAVENOUS | Status: DC

## 2017-03-05 MED ORDER — PHENYLEPHRINE 40 MCG/ML (10ML) SYRINGE FOR IV PUSH (FOR BLOOD PRESSURE SUPPORT)
PREFILLED_SYRINGE | INTRAVENOUS | Status: DC | PRN
  Administered 2017-03-05: 80 ug via INTRAVENOUS

## 2017-03-05 MED ORDER — LIDOCAINE 2% (20 MG/ML) 5 ML SYRINGE
INTRAMUSCULAR | Status: AC
  Filled 2017-03-05: qty 5

## 2017-03-05 MED ORDER — PROPOFOL 10 MG/ML IV BOLUS
INTRAVENOUS | Status: AC
  Filled 2017-03-05: qty 20

## 2017-03-05 MED ORDER — LACTATED RINGERS IV SOLN
INTRAVENOUS | Status: DC
Start: 2017-03-05 — End: 2017-03-05
  Administered 2017-03-05: 1000 mL via INTRAVENOUS

## 2017-03-05 MED ORDER — PROPOFOL 500 MG/50ML IV EMUL
INTRAVENOUS | Status: DC | PRN
  Administered 2017-03-05: 100 ug/kg/min via INTRAVENOUS

## 2017-03-05 MED ORDER — PROPOFOL 10 MG/ML IV BOLUS
INTRAVENOUS | Status: DC | PRN
  Administered 2017-03-05: 50 mg via INTRAVENOUS
  Administered 2017-03-05: 20 mg via INTRAVENOUS

## 2017-03-05 SURGICAL SUPPLY — 22 items

## 2017-03-05 NOTE — Discharge Instructions (Signed)

## 2017-03-05 NOTE — Transfer of Care (Signed)
Immediate Anesthesia Transfer of Care Note  Patient: Sherry Lambert  Procedure(s) Performed: Procedure(s): COLONOSCOPY WITH PROPOFOL (N/A)  Patient Location: PACU  Anesthesia Type:MAC  Level of Consciousness: awake, alert  and oriented  Airway & Oxygen Therapy: Patient Spontanous Breathing and Patient connected to face mask oxygen  Post-op Assessment: Report given to RN and Post -op Vital signs reviewed and stable  Post vital signs: Reviewed and stable  Last Vitals:  Vitals:   03/05/17 1000  BP: (!) 110/50  Pulse: 86  Resp: (!) 21  Temp: 36.6 C    Last Pain:  Vitals:   03/05/17 1000  TempSrc: Oral         Complications: No apparent anesthesia complications

## 2017-03-05 NOTE — Discharge Summary (Signed)
Paged then reached Dr Nyoka Cowden regarding inability to print AVS due to orders. D/C sheet printed for colonoscopy and monitored anesthesia care- discussed/understood with pt & daughter-in-law to allow them to receive instruction.Sherry Lambert

## 2017-03-05 NOTE — Anesthesia Preprocedure Evaluation (Signed)
Anesthesia Evaluation  Patient identified by MRN, date of birth, ID band Patient awake    Reviewed: Allergy & Precautions, NPO status , Patient's Chart, lab work & pertinent test results  Airway Mallampati: II  TM Distance: >3 FB Neck ROM: Full    Dental no notable dental hx. (+) Teeth Intact   Pulmonary shortness of breath and with exertion, pneumonia, resolved, COPD,  COPD inhaler, former smoker,    Pulmonary exam normal breath sounds clear to auscultation       Cardiovascular hypertension, Pt. on medications + Peripheral Vascular Disease  Normal cardiovascular exam Rhythm:Regular Rate:Normal     Neuro/Psych CVA, Residual Symptoms negative psych ROS   GI/Hepatic GERD  Medicated and Controlled,  Endo/Other  diabetes, Well Controlled, Type 2Hyperlipidemia   Renal/GU Renal diseaseRenal calculi  negative genitourinary   Musculoskeletal   Abdominal   Peds  Hematology   Anesthesia Other Findings   Reproductive/Obstetrics                             Anesthesia Physical Anesthesia Plan  ASA: III  Anesthesia Plan: MAC   Post-op Pain Management:    Induction:   PONV Risk Score and Plan: 2 and Ondansetron and Propofol  Airway Management Planned: Natural Airway  Additional Equipment:   Intra-op Plan:   Post-operative Plan:   Informed Consent: I have reviewed the patients History and Physical, chart, labs and discussed the procedure including the risks, benefits and alternatives for the proposed anesthesia with the patient or authorized representative who has indicated his/her understanding and acceptance.   Dental advisory given  Plan Discussed with: Anesthesiologist, CRNA and Surgeon  Anesthesia Plan Comments:         Anesthesia Quick Evaluation

## 2017-03-05 NOTE — Anesthesia Postprocedure Evaluation (Signed)
Anesthesia Post Note  Patient: Sherry Lambert  Procedure(s) Performed: Procedure(s) (LRB): COLONOSCOPY WITH PROPOFOL (N/A)     Patient location during evaluation: PACU Anesthesia Type: MAC Level of consciousness: awake and alert Pain management: pain level controlled Vital Signs Assessment: post-procedure vital signs reviewed and stable Respiratory status: spontaneous breathing, nonlabored ventilation and respiratory function stable Cardiovascular status: stable and blood pressure returned to baseline Postop Assessment: no signs of nausea or vomiting Anesthetic complications: no    Last Vitals:  Vitals:   03/05/17 1140 03/05/17 1150  BP: (!) 97/52 90/64  Pulse: 89 89  Resp: (!) 22 14  Temp:      Last Pain:  Vitals:   03/05/17 1134  TempSrc: Oral                 Dudley Mages A.

## 2017-03-05 NOTE — Op Note (Signed)
[facility] Patient Name: Sherry Lambert Procedure Date: 03/05/2017 MRN: [id number] Attending MD: Mauri Pole , MD Date of Birth: 13-Jul-1941 CSN: [id number] Age: 76 Admit Type: Outpatient Procedure:                Colonoscopy Indications:              Abnormal CT of the GI tract, Change in bowel                            habits, Change in stool caliber Providers:                Mauri Pole, MD, Burtis Junes, RN, Corliss Parish, Technician Referring MD:              Medicines:                Monitored Anesthesia Care Complications:            No immediate complications. Estimated Blood Loss:     Estimated blood loss was minimal. Procedure:                Pre-Anesthesia Assessment:                           - Prior to the procedure, a History and Physical                            was performed, and patient medications and                            allergies were reviewed. The patient's tolerance of                            previous anesthesia was also reviewed. The risks                            and benefits of the procedure and the sedation                            options and risks were discussed with the patient.                            All questions were answered, and informed consent                            was obtained. Prior Anticoagulants: The patient has                            taken no previous anticoagulant or antiplatelet                            agents. ASA Grade Assessment: III - A patient with  severe systemic disease. After reviewing the risks                            and benefits, the patient was deemed in                            satisfactory condition to undergo the procedure.                           After obtaining informed consent, the colonoscope                            was passed under direct vision. Throughout the                            procedure,  the patient's blood pressure, pulse, and                            oxygen saturations were monitored continuously. The                            EC-[id number] ([id number]) scope was introduced through                            the anus and advanced to the the cecum, identified                            by appendiceal orifice and ileocecal valve. The                            colonoscopy was performed without difficulty. The                            patient tolerated the procedure well. The quality                            of the bowel preparation was excellent. The                            ileocecal valve, appendiceal orifice, and rectum                            were photographed. Scope In: 10:52:28 AM Scope Out: 11:21:16 AM Scope Withdrawal Time: 0 hours 12 minutes 27 seconds  Total Procedure Duration: 0 hours 28 minutes 48 seconds  Findings:      The digital rectal exam revealed a rectal mass. The mass was       non-circumferential and located predominantly at the posterior bowel       wall.      A frond-like/villous, infiltrative and ulcerated partially obstructing       large mass was found in the cecum. The mass was partially       circumferential (involving two-thirds of the lumen circumference). The       mass measured seven cm in length. In addition, its diameter measured       >  thirty mm. Oozing was present. This was biopsied with a cold jumbo       forceps for histology.      A frond-like/villous non-obstructing medium-sized mass was found in the       distal rectum within 5 cm from anal verge. The mass was       non-circumferential. The mass measured two cm in length. In addition,       its diameter measured 20 mm. No bleeding was present. This was biopsied       with a cold forceps for histology. Impression:               - Rectal mass.                           - Rule out malignancy, partially obstructing tumor                            in the cecum. Biopsied.                            - Rule out malignancy, tumor in the rectum.                            Biopsied.                           - The examination was suspicious for a                            malignant-appearing tumor. Biopsied. Moderate Sedation:      N/A- Per Anesthesia Care Recommendation:           - Patient has a contact number available for                            emergencies. The signs and symptoms of potential                            delayed complications were discussed with the                            patient. Return to normal activities tomorrow.                            Written discharge instructions were provided to the                            patient.                           - Resume previous diet.                           - Continue present medications.                           - Await pathology results.                           -  No ibuprofen, naproxen, or other non-steroidal                            anti-inflammatory drugs.                           - CT chest, abdomen and pelvis with contrast                           - Repeat colonoscopy date to be determined after                            pending pathology results are reviewed for                            surveillance based on pathology results. Procedure Code(s):        --- Professional ---                           [phone], Colonoscopy, flexible; with biopsy, single                            or multiple Diagnosis Code(s):        --- Professional ---                           K62.89, Other specified diseases of anus and rectum                           D49.0, Neoplasm of unspecified behavior of                            digestive system                           R19.4, Change in bowel habit                           R19.5, Other fecal abnormalities                           R93.3, Abnormal findings on diagnostic imaging of                            other parts of digestive tract CPT  copyright 2016 American Medical Association. All rights reserved. The codes documented in this report are preliminary and upon coder review may  be revised to meet current compliance requirements. Mauri Pole, MD 03/05/2017 11:32:47 AM This report has been signed electronically. Number of Addenda: 0

## 2017-03-05 NOTE — H&P (Signed)
[facility] History and Physical   Primary Care Physician:  [name], MD   Reason for Procedure:  Change in bowel habits, and stool caliber  Plan:    Colonoscopy with possible intervention The risks and benefits as well as alternatives of endoscopic procedure(s) have been discussed and reviewed. All questions answered. The patient agrees to proceed.     HPI: Sherry Lambert is a 76 y.o. female with COPD, CKD, CVA, here for colonoscopy for evaluation of change in bowel habits.    Past Medical History:  Diagnosis Date  . [name]   . Cataract   . Chronic kidney disease    kidney stone  . Colitis   . Diabetes mellitus without complication ([place])    no meds taken  . Emphysema of lung ([place])   . GERD (gastroesophageal reflux disease)   . Hyperlipidemia   . Hypertension   . Peripheral arterial disease ([place])   . [name]   . Stroke [facility])    November 2016- Thanksgiving night    Past Surgical History:  Procedure Laterality Date  . ABDOMINAL HYSTERECTOMY    . BALLOON DILATION N/A 12/23/2012   Procedure: BALLOON DILATION;  Surgeon: Inda Castle, MD;  Location: [facility];  Service: Endoscopy;  Laterality: N/A;  . BALLOON DILATION N/A 01/15/2013   Procedure: BALLOON DILATION;  Surgeon: Inda Castle, MD;  Location: [facility];  Service: Endoscopy;  Laterality: N/A;  . cataract     both eyes  . COLONOSCOPY    . DENTAL RESTORATION/EXTRACTION WITH X-RAY    . ESOPHAGOGASTRODUODENOSCOPY N/A 12/23/2012   Procedure: ESOPHAGOGASTRODUODENOSCOPY (EGD);  Surgeon: Inda Castle, MD;  Location: [facility];  Service: Endoscopy;  Laterality: N/A;  . ESOPHAGOGASTRODUODENOSCOPY N/A 01/15/2013   Procedure: ESOPHAGOGASTRODUODENOSCOPY (EGD);  Surgeon: Inda Castle, MD;  Location: [facility];  Service: Endoscopy;  Laterality: N/A;  . TONSILLECTOMY      Prior to Admission medications   Medication Sig Start Date End Date Taking? Authorizing  Provider  albuterol (PROVENTIL) (2.5 MG/3ML) 0.083% nebulizer solution Take 2.5 mg by nebulization every 6 (six) hours as needed for wheezing or shortness of breath.   Yes [provider]  aspirin EC 81 MG tablet Take 1 tablet (81 mg total) by mouth daily. 12/20/14  Yes Rosalin Hawking, MD  cetirizine (ZYRTEC) 10 MG tablet Take 10 mg by mouth daily as needed for allergies.   Yes [provider]  doxylamine, Sleep, (SOBA NIGHTTIME SLEEP AID) 25 MG tablet Take 25 mg by mouth at bedtime as needed (for sleep.).   Yes [provider]  fluticasone furoate-vilanterol (BREO ELLIPTA) 200-25 MCG/INH AEPB Inhale 1 puff into the lungs daily. Patient taking differently: Inhale 1 puff into the lungs every evening.  01/17/17  Yes de [place], [place], MD  lisinopril-hydrochlorothiazide (ZESTORETIC) 20-12.5 MG tablet Take 1 tablet by mouth daily. 12/13/16  Yes [name], MD  naproxen sodium (ANAPROX) 220 MG tablet Take 220-440 mg by mouth 2 (two) times daily as needed (for leg pain/injury).   Yes [provider]  pravastatin (PRAVACHOL) 20 MG tablet Take 1 tablet (20 mg total) by mouth daily. 10/16/16  Yes [name], MD  Probiotic Product (PROBIOTIC PO) Take 1 capsule by mouth daily.   Yes [provider]  umeclidinium bromide (INCRUSE ELLIPTA) 62.5 MCG/INH AEPB Inhale 1 puff into the lungs daily. Patient taking differently: Inhale 1 puff into the lungs every evening.  01/17/17  Yes  [place], [place], MD    Current Facility-Administered Medications  Medication Dose Route Frequency Provider Last Rate Last Dose  . 0.9 %  sodium chloride infusion   Intravenous Continuous Chelsy Parrales V, MD      . lactated ringers infusion   Intravenous Continuous Tameya Kuznia V, MD 125 mL/hr at 03/05/17 1029 1,000 mL at 03/05/17 1029    Allergies as of 01/23/2017 - Review Complete 01/17/2017  Allergen Reaction Noted  . Lipitor [atorvastatin] Itching  12/20/2014    Family History  Problem Relation Age of Onset  . Hypertension Father   . Diabetes Father   . Colon cancer Sister 48  . Cancer Neg Hx   . Early death Neg Hx   . Stroke Neg Hx   . Esophageal cancer Neg Hx   . Rectal cancer Neg Hx   . Stomach cancer Neg Hx     Social History   Social History  . Marital status: Divorced    Spouse name: N/A  . Number of children: 1  . Years of education: N/A   Occupational History  . Retired    Social History Main Topics  . Smoking status: Former Smoker    Years: 55.00    Types: Cigarettes    Quit date: 10/19/2002  . Smokeless tobacco: Never Used  . Alcohol use No  . Drug use: No  . Sexual activity: No   Other Topics Concern  . Not on file   Social History Narrative   Regular exercise-no   Caffeine Use-yes    Review of Systems:  All other review of systems negative except as mentioned in the HPI.  Physical Exam: Vital signs in last 24 hours: Temp:  [97.8 F (36.6 C)] 97.8 F (36.6 C) (07/03 1000) Pulse Rate:  [86] 86 (07/03 1000) Resp:  [21] 21 (07/03 1000) BP: (110)/(50) 110/50 (07/03 1000) SpO2:  [100 %] 100 % (07/03 1000) Weight:  [117 lb (53.1 kg)] 117 lb (53.1 kg) (07/03 1000)   General:   Alert,  Well-developed, well-nourished, pleasant and cooperative in NAD Lungs:  Clear throughout to auscultation.   Heart:  Regular rate and rhythm; no murmurs, clicks, rubs,  or gallops. Abdomen:  Soft, nontender and nondistended. Normal bowel sounds.   Neuro/Psych:  Alert and cooperative. Normal mood and affect. A and O x 3   @K .Denzil Magnuson, MD [phone] Mon-Fri 8a-5p [phone] after 5p, weekends, holidays 03/05/2017 10:40 AM@

## 2017-03-07 ENCOUNTER — Inpatient Hospital Stay: Admission: RE | Admit: 2017-03-07 | Payer: Medicare HMO | Source: Ambulatory Visit

## 2017-03-07 ENCOUNTER — Encounter (HOSPITAL_COMMUNITY): Payer: Self-pay | Admitting: Gastroenterology

## 2017-03-07 ENCOUNTER — Other Ambulatory Visit: Payer: Medicare HMO

## 2017-03-11 ENCOUNTER — Inpatient Hospital Stay: Admission: RE | Admit: 2017-03-11 | Payer: Medicare HMO | Source: Ambulatory Visit

## 2017-03-11 ENCOUNTER — Other Ambulatory Visit: Payer: Medicare HMO

## 2017-03-22 ENCOUNTER — Telehealth: Payer: Self-pay | Admitting: Pulmonary Disease

## 2017-03-22 MED ORDER — FLUTICASONE FUROATE-VILANTEROL 200-25 MCG/INH IN AEPB: 1.0000 | INHALATION_SPRAY | Freq: Every day | RESPIRATORY_TRACT | 0 refills | Status: DC

## 2017-03-22 MED ORDER — UMECLIDINIUM BROMIDE 62.5 MCG/INH IN AEPB: 1.0000 | INHALATION_SPRAY | Freq: Every day | RESPIRATORY_TRACT | 0 refills | Status: DC

## 2017-03-22 NOTE — Telephone Encounter (Signed)
Spoke with patient. She is aware that I have placed one sample of Breo and Incruse up front for her. She verbalized understanding. Nothing further needed at time of call.

## 2017-05-13 ENCOUNTER — Telehealth: Payer: Self-pay | Admitting: Gastroenterology

## 2017-05-14 NOTE — Telephone Encounter (Signed)
Re-faxed. Confirmed receipt. Patient aware. Instructed to call if she does not hear from them in the next 5 business days.

## 2017-05-15 DIAGNOSIS — K629 Disease of anus and rectum, unspecified: Secondary | ICD-10-CM | POA: Diagnosis not present

## 2017-05-15 DIAGNOSIS — K639 Disease of intestine, unspecified: Secondary | ICD-10-CM | POA: Diagnosis not present

## 2017-05-15 DIAGNOSIS — J449 Chronic obstructive pulmonary disease, unspecified: Secondary | ICD-10-CM | POA: Diagnosis not present

## 2017-05-21 ENCOUNTER — Telehealth: Payer: Self-pay | Admitting: Pulmonary Disease

## 2017-05-21 NOTE — Telephone Encounter (Signed)
We have samples for Breo but not Incruse. Pt understood and will pickl them up on Thursday. Nothing further is needed/

## 2017-05-23 ENCOUNTER — Encounter: Payer: Self-pay | Admitting: Internal Medicine

## 2017-05-23 ENCOUNTER — Other Ambulatory Visit (INDEPENDENT_AMBULATORY_CARE_PROVIDER_SITE_OTHER): Payer: Medicare HMO

## 2017-05-23 ENCOUNTER — Ambulatory Visit (INDEPENDENT_AMBULATORY_CARE_PROVIDER_SITE_OTHER): Payer: Medicare HMO | Admitting: Internal Medicine

## 2017-05-23 VITALS — BP 124/60 | HR 77 | Temp 98.0°F | Ht 60.0 in | Wt 115.0 lb

## 2017-05-23 DIAGNOSIS — E785 Hyperlipidemia, unspecified: Secondary | ICD-10-CM | POA: Diagnosis not present

## 2017-05-23 DIAGNOSIS — J438 Other emphysema: Secondary | ICD-10-CM | POA: Diagnosis not present

## 2017-05-23 DIAGNOSIS — E118 Type 2 diabetes mellitus with unspecified complications: Secondary | ICD-10-CM | POA: Diagnosis not present

## 2017-05-23 DIAGNOSIS — I10 Essential (primary) hypertension: Secondary | ICD-10-CM | POA: Diagnosis not present

## 2017-05-23 LAB — LIPID PANEL
CHOL/HDL RATIO: 2
Cholesterol: 153 mg/dL (ref 0–200)
HDL: 76 mg/dL (ref >39.00)
LDL CALC: 58 mg/dL (ref 0–99)
NONHDL: 76.67
Triglycerides: 95 mg/dL (ref 0.0–149.0)
VLDL: 19 mg/dL (ref 0.0–40.0)

## 2017-05-23 LAB — COMPREHENSIVE METABOLIC PANEL
ALT: 13 U/L (ref 0–35)
AST: 18 U/L (ref 0–37)
Albumin: 4.1 g/dL (ref 3.5–5.2)
Alkaline Phosphatase: 55 U/L (ref 39–117)
BUN: 28 mg/dL — AB (ref 6–23)
CO2: 27 meq/L (ref 19–32)
CREATININE: 1.08 mg/dL (ref 0.40–1.20)
Calcium: 9.9 mg/dL (ref 8.4–10.5)
Chloride: 106 mEq/L (ref 96–112)
GFR: 52.57 mL/min — ABNORMAL LOW (ref >60.00)
GLUCOSE: 94 mg/dL (ref 70–99)
Potassium: 4.5 mEq/L (ref 3.5–5.1)
Sodium: 139 mEq/L (ref 135–145)
Total Bilirubin: 0.4 mg/dL (ref 0.2–1.2)
Total Protein: 7.2 g/dL (ref 6.0–8.3)

## 2017-05-23 LAB — CBC
HCT: 41 % (ref 36.0–46.0)
Hemoglobin: 13.2 g/dL (ref 12.0–15.0)
MCHC: 32.1 g/dL (ref 30.0–36.0)
MCV: 88.6 fl (ref 78.0–100.0)
Platelets: 301 10*3/uL (ref 150.0–400.0)
RBC: 4.62 Mil/uL (ref 3.87–5.11)
RDW: 14.3 % (ref 11.5–15.5)
WBC: 10.6 10*3/uL — ABNORMAL HIGH (ref 4.0–10.5)

## 2017-05-23 LAB — HEMOGLOBIN A1C: Hgb A1c MFr Bld: 6.5 % (ref 4.6–6.5)

## 2017-05-23 MED ORDER — FLUTICASONE-UMECLIDIN-VILANT 100-62.5-25 MCG/INH IN AEPB: 1.0000 | INHALATION_SPRAY | Freq: Every day | RESPIRATORY_TRACT | 11 refills | Status: DC

## 2017-05-23 NOTE — Patient Instructions (Signed)
We have sent in trilegy for you to use 1 puff daily.

## 2017-05-24 DIAGNOSIS — D128 Benign neoplasm of rectum: Secondary | ICD-10-CM | POA: Diagnosis not present

## 2017-05-24 DIAGNOSIS — Z87891 Personal history of nicotine dependence: Secondary | ICD-10-CM | POA: Diagnosis not present

## 2017-05-24 DIAGNOSIS — K639 Disease of intestine, unspecified: Secondary | ICD-10-CM | POA: Diagnosis not present

## 2017-05-24 NOTE — Assessment & Plan Note (Signed)
Taking pravastatin, checking lipid panel and adjust for goal LDL <70.

## 2017-05-24 NOTE — Progress Notes (Signed)
   Subjective:    Patient ID: Sherry Lambert, female    DOB: 1941/04/06, 76 y.o.   MRN: [id number]  HPI The patient is a 76 YO female coming in for some follow up before colon surgery for likely colon and possible rectal cancer. She had stroke about 1-2 years ago and is following on her diabetes (controlled off meds, on ACE-I and statin, complicated by PVD/stroke), her blood pressure (controlled on lisinopril/hctz, no chest pains SOB or side effects) and her cholesterol (taking pravastatin and goal LDL <100 or 70, no side effects, no recurrent stroke symptoms). Is worried about the cancer surgery but trying to be positive.   Review of Systems  Constitutional: Positive for fatigue. Negative for activity change, appetite change, fever and unexpected weight change.  HENT: Negative.   Eyes: Negative.   Respiratory: Negative for cough, chest tightness and shortness of breath.   Cardiovascular: Negative for chest pain, palpitations and leg swelling.  Gastrointestinal: Negative for abdominal distention, abdominal pain, constipation, diarrhea, nausea and vomiting.  Musculoskeletal: Negative.   Skin: Negative.   Neurological: Positive for weakness and numbness.       Stable since stroke  Psychiatric/Behavioral: Negative.       Objective:   Physical Exam  Constitutional: She is oriented to person, place, and time. She appears well-developed and well-nourished.  HENT:  Head: Normocephalic and atraumatic.  Eyes: EOM are normal.  Neck: Normal range of motion.  Cardiovascular: Normal rate and regular rhythm.   Pulmonary/Chest: Effort normal. No respiratory distress. She has no wheezes. She has no rales.  Abdominal: Soft. Bowel sounds are normal. She exhibits no distension. There is no tenderness. There is no rebound.  Musculoskeletal: She exhibits no edema.  Neurological: She is alert and oriented to person, place, and time.  Stable neurological changes  Skin: Skin is warm and dry.  Psychiatric:  She has a normal mood and affect.   Vitals:   05/23/17 1429  BP: 124/60  Pulse: 77  Temp: 98 F (36.7 C)  TempSrc: Oral  SpO2: 100%  Weight: 115 lb (52.2 kg)  Height: 5' (1.524 m)      Assessment & Plan:

## 2017-05-24 NOTE — Assessment & Plan Note (Signed)
Checking CMP and adjust her lisinopril/hctz as needed. No side effects and BP at goal.

## 2017-05-24 NOTE — Assessment & Plan Note (Signed)
Controlled with diet, checking HgA1c and lipid panel and adjust as needed. Adjust as needed.

## 2017-05-24 NOTE — Assessment & Plan Note (Signed)
Change breo and incruse to trelegy to cut costs for her. Uses albuterol as needed. No flare today.

## 2017-05-27 ENCOUNTER — Telehealth: Payer: Self-pay | Admitting: Adult Health

## 2017-05-27 NOTE — Telephone Encounter (Signed)
Pt requesting Incruse. There are no samples and advised pt. She understood and nothing further is needed.

## 2017-05-28 DIAGNOSIS — I10 Essential (primary) hypertension: Secondary | ICD-10-CM | POA: Diagnosis not present

## 2017-05-28 DIAGNOSIS — I69351 Hemiplegia and hemiparesis following cerebral infarction affecting right dominant side: Secondary | ICD-10-CM | POA: Diagnosis not present

## 2017-05-28 DIAGNOSIS — J449 Chronic obstructive pulmonary disease, unspecified: Secondary | ICD-10-CM | POA: Diagnosis not present

## 2017-05-28 DIAGNOSIS — Z87891 Personal history of nicotine dependence: Secondary | ICD-10-CM | POA: Diagnosis not present

## 2017-05-28 DIAGNOSIS — Z79899 Other long term (current) drug therapy: Secondary | ICD-10-CM | POA: Diagnosis not present

## 2017-05-28 DIAGNOSIS — K6389 Other specified diseases of intestine: Secondary | ICD-10-CM | POA: Diagnosis not present

## 2017-05-28 DIAGNOSIS — D126 Benign neoplasm of colon, unspecified: Secondary | ICD-10-CM | POA: Diagnosis not present

## 2017-05-28 DIAGNOSIS — Z7982 Long term (current) use of aspirin: Secondary | ICD-10-CM | POA: Diagnosis not present

## 2017-05-28 DIAGNOSIS — K621 Rectal polyp: Secondary | ICD-10-CM | POA: Diagnosis not present

## 2017-05-28 DIAGNOSIS — D120 Benign neoplasm of cecum: Secondary | ICD-10-CM | POA: Diagnosis not present

## 2017-05-28 DIAGNOSIS — D128 Benign neoplasm of rectum: Secondary | ICD-10-CM | POA: Diagnosis not present

## 2017-05-28 LAB — HM COLONOSCOPY

## 2017-05-29 ENCOUNTER — Encounter: Payer: Self-pay | Admitting: Internal Medicine

## 2017-06-03 ENCOUNTER — Telehealth: Payer: Self-pay | Admitting: Adult Health

## 2017-06-03 ENCOUNTER — Ambulatory Visit (INDEPENDENT_AMBULATORY_CARE_PROVIDER_SITE_OTHER)
Admission: RE | Admit: 2017-06-03 | Discharge: 2017-06-03 | Disposition: A | Discharge status: Home / Self Care | Payer: Medicare HMO | Source: Ambulatory Visit | Attending: Adult Health | Admitting: Adult Health

## 2017-06-03 ENCOUNTER — Ambulatory Visit (INDEPENDENT_AMBULATORY_CARE_PROVIDER_SITE_OTHER): Payer: Medicare HMO | Admitting: Adult Health

## 2017-06-03 ENCOUNTER — Encounter: Payer: Self-pay | Admitting: Adult Health

## 2017-06-03 VITALS — BP 114/58 | HR 78 | Ht 60.0 in | Wt 113.8 lb

## 2017-06-03 DIAGNOSIS — C189 Malignant neoplasm of colon, unspecified: Secondary | ICD-10-CM

## 2017-06-03 DIAGNOSIS — R079 Chest pain, unspecified: Secondary | ICD-10-CM | POA: Diagnosis not present

## 2017-06-03 DIAGNOSIS — Z01818 Encounter for other preprocedural examination: Secondary | ICD-10-CM

## 2017-06-03 DIAGNOSIS — J438 Other emphysema: Secondary | ICD-10-CM | POA: Diagnosis not present

## 2017-06-03 DIAGNOSIS — J430 Unilateral pulmonary emphysema [MacLeod's syndrome]: Secondary | ICD-10-CM | POA: Diagnosis not present

## 2017-06-03 HISTORY — DX: Malignant neoplasm of colon, unspecified: C18.9

## 2017-06-03 NOTE — Progress Notes (Signed)
@Patient  ID: Sherry Lambert, female    DOB: April 29, 1941, 76 y.o.   MRN: [id number]  Chief Complaint  Patient presents with  . Follow-up    COPD , surgery clearance     Referring provider: [name], *  HPI: 76 year old female former smoker followed for moderate to severe COPD  TEST  PFT (02/13/16)  FEV1/FVC 45%, FEV1  0.82  46%. No hyperinflation. DLCO  51% Postbronchodilator FEV1 53%, ratio 47, FVC 84, positive bronchodilator response.  06/03/2017 Follow up ; COPD /Pulmonary preop surgical clearance Patient returns for a follow-up visit. She was last seen in May 2018. 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 . She is maintained on TRELEGY for her moderate to severe COPD .  CXR in May 2018 was clear.  Says overall her breathing is doing good. No dyspnea at rest. Gets winded with prolonged walking . She uses a walker . She had a stroke in 2015 . Has some general weakness and uses a walker.  Does not drive. Does housework. Does ADLS. Lives alone. Family drives her.  No recent flare of COPD , no recent abx , steroids.  Previous PFTs 02/2016 showed moderate to severe COPD with postbronchodilator FEV1 53%, ratio 47, FVC 84, positive bronchodilator response. DLCO 51%.Not on oxygen. Patient is on ACE inhibitor. Denies daily cough. Spirometry today shows stable moderate COPD with FEV1 at 59%, ratio 44, FVC 101%.   She has been dx with a rectal/intestinal  mass . She is followed at [facility] .  She has upcoming surgery for resection planned on 06/20/17. Information from patient . No records from [facility] . Unable to pull up on Care Everywhere.  She needs a pulmonary preop clearance .   [place] Postperative Pulmonary Risk Score Comment Score  Type of surgery - abd ao aneurysm (27), thoracic (21), neurosurgery / upper abdominal / vascular (21), neck (11)  15  Emergency Surgery - (11)  0  ALbumin < 3 or poor nutritional state - (9)  0  BUN > 30 -  (8)  0  Partial or completely dependent functional  status - (7)  0  COPD -  (6)  6  Age - 60 to 69 (4), > 70  (6)  6  TOTAL    Risk Stratifcation scores  - < 10, 11-19, 20-27, 28-40, >40  27        Allergies  Allergen Reactions  . Lipitor [Atorvastatin] Itching    Immunization History  Administered Date(s) Administered  . Influenza, High Dose Seasonal PF 07/27/2013, 05/14/2016, 05/20/2017  . Influenza,inj,Quad PF,6+ Mos 08/03/2014, 06/21/2015  . Influenza-Unspecified 07/04/2012  . Pneumococcal Conjugate-13 11/11/2015  . Pneumococcal-Unspecified 09/03/2010  . Td 09/03/2012    Past Medical History:  Diagnosis Date  . [name]   . Cataract   . Chronic kidney disease    kidney stone  . Colitis   . Diabetes mellitus without complication ([place])    no meds taken  . Emphysema of lung ([place])   . GERD (gastroesophageal reflux disease)   . Hyperlipidemia   . Hypertension   . Peripheral arterial disease ([place])   . [name]   . Stroke [facility])    November 2016- Thanksgiving night    Tobacco History: History  Smoking Status  . Former Smoker  . Years: 55.00  . Types: Cigarettes  . Quit date: 10/19/2002  Smokeless Tobacco  . Never Used   Counseling given: Not Answered   Outpatient Encounter Prescriptions  as of 06/03/2017  Medication Sig  . albuterol (PROVENTIL) (2.5 MG/3ML) 0.083% nebulizer solution Take 2.5 mg by nebulization every 6 (six) hours as needed for wheezing or shortness of breath.  Marland Kitchen aspirin EC 81 MG tablet Take 1 tablet (81 mg total) by mouth daily.  Marland Kitchen doxylamine, Sleep, (SOBA NIGHTTIME SLEEP AID) 25 MG tablet Take 25 mg by mouth at bedtime as needed (for sleep.).  Marland Kitchen Fluticasone-Umeclidin-Vilant (TRELEGY ELLIPTA) 100-62.5-25 MCG/INH AEPB Inhale 1 puff into the lungs daily.  Marland Kitchen lisinopril-hydrochlorothiazide (ZESTORETIC) 20-12.5 MG tablet Take 1 tablet by mouth daily.  . pravastatin (PRAVACHOL) 20 MG tablet Take 1 tablet (20 mg total) by mouth daily.  . cetirizine (ZYRTEC) 10 MG tablet Take 10 mg  by mouth daily as needed for allergies.  . Probiotic Product (PROBIOTIC PO) Take 1 capsule by mouth daily.  . [DISCONTINUED] naproxen sodium (ANAPROX) 220 MG tablet Take 220-440 mg by mouth 2 (two) times daily as needed (for leg pain/injury).   No facility-administered encounter medications on file as of 06/03/2017.      Review of Systems  Constitutional:   No  weight loss, night sweats,  Fevers, chills, fatigue, or  lassitude.  HEENT:   No headaches,  Difficulty swallowing,  Tooth/dental problems, or  Sore throat,                No sneezing, itching, ear ache, nasal congestion, post nasal drip,   CV:  No chest pain,  Orthopnea, PND, swelling in lower extremities, anasarca, dizziness, palpitations, syncope.   GI  No heartburn, indigestion, abdominal pain, nausea, vomiting, diarrhea, change in bowel habits, loss of appetite, bloody stools.   Resp: .  No excess mucus, no productive cough,  No non-productive cough,  No coughing up of blood.  No change in color of mucus.  No wheezing.  No chest wall deformity  Skin: no rash or lesions.  GU: no dysuria, change in color of urine, no urgency or frequency.  No flank pain, no hematuria   MS:  No joint pain or swelling.  No decreased range of motion.  No back pain.    Physical Exam  BP (!) 114/58 (BP Location: Left Arm, Cuff Size: Normal)   Pulse 78   Ht 5' (1.524 m)   Wt 113 lb 12.8 oz (51.6 kg)   SpO2 98%   BMI 22.23 kg/m   GEN: A/Ox3; pleasant , NAD, elderly walks with walker    HEENT:  [place]/AT,  EACs-clear, TMs-wnl, NOSE-clear, THROAT-clear, no lesions, no postnasal drip or exudate noted.   NECK:  Supple w/ fair ROM; no JVD; normal carotid impulses w/o bruits; no thyromegaly or nodules palpated; no lymphadenopathy.    RESP  Clear  P & A; w/o, wheezes/ rales/ or rhonchi. no accessory muscle use, no dullness to percussion  CARD:  RRR, no m/r/g, no peripheral edema, pulses intact, no cyanosis or clubbing.  GI:   Soft & nt; nml  bowel sounds; no organomegaly or masses detected.   Musco: Warm bil, no deformities or joint swelling noted.   Neuro: alert, no focal deficits noted.    Skin: Warm, no lesions or rashes    Lab Results:  CBC  BNP No results found for: BNP  ProBNP No results found for: PROBNP  Imaging: Dg Chest 2 View  Result Date: 06/03/2017 CLINICAL DATA:  Preop for partial colectomy. EXAM: CHEST  2 VIEW COMPARISON:  01/17/2017 FINDINGS: The cardiac silhouette, mediastinal and hilar contours are within normal limits and  stable. Mild tortuosity and calcification of the thoracic aorta. Stable small hiatal hernia. The lungs demonstrate stable emphysematous changes. No acute pulmonary findings. No pulmonary nodules or pleural effusion. The bony thorax is intact. IMPRESSION: Emphysematous changes but no acute pulmonary findings. Electronically Signed   By: Marijo Sanes M.D.   On: 06/03/2017 13:57     Assessment & Plan:   COPD (chronic obstructive pulmonary disease) ([place]) Compensated without flare  Stable lung function with no significant change since 02/2016.   Plan  Cont on current regimen.  follow up in 4 months and As needed    Preoperative clearance Pulmonary preop clearance .  Pt has moderate to severe COPD that is stable . She is independent , not on oxygen and no recent flare of COPD in former smoker  She has a moderate pulmonary surgical risk . Went over pulmonary risk factors with pt :    Major Pulmonary risks identified in the multifactorial risk analysis are but not limited to a) pneumonia; b) recurrent intubation risk; c) prolonged or recurrent acute respiratory failure needing mechanical ventilation; d) prolonged hospitalization; e) DVT/Pulmonary embolism; f) Acute Pulmonary edema  Recommend 1. Short duration of surgery as much as possible and avoid paralytic if possible 2. Recovery in step down or ICU with Pulmonary consultation 3. DVT prophylaxis 4. Aggressive pulmonary  toilet with o2, bronchodilatation, and incentive spirometry and early ambulation        Rexene Edison, NP 06/03/2017

## 2017-06-03 NOTE — Assessment & Plan Note (Signed)
Pulmonary preop clearance .  Pt has moderate to severe COPD that is stable . She is independent , not on oxygen and no recent flare of COPD in former smoker  She has a moderate pulmonary surgical risk . Went over pulmonary risk factors with pt :    Major Pulmonary risks identified in the multifactorial risk analysis are but not limited to a) pneumonia; b) recurrent intubation risk; c) prolonged or recurrent acute respiratory failure needing mechanical ventilation; d) prolonged hospitalization; e) DVT/Pulmonary embolism; f) Acute Pulmonary edema  Recommend 1. Short duration of surgery as much as possible and avoid paralytic if possible 2. Recovery in step down or ICU with Pulmonary consultation 3. DVT prophylaxis 4. Aggressive pulmonary toilet with o2, bronchodilatation, and incentive spirometry and early ambulation

## 2017-06-03 NOTE — Patient Instructions (Addendum)
Continue on TRELEGY 1 puff daily  Follow up with Surgeon as planned  Chest xray today .  Follow up with Dr. Vaughan Browner in 4 months and As needed

## 2017-06-03 NOTE — Telephone Encounter (Signed)
The patient and I spoke about this at length at her visit today  Informed patient nothing has been received for TP but will call Dr Marianne Sofia office to have form faxed and will call patient once this is received so that hopefully her daughter-in-law can pick it up for her in time for her Thursday appt with anesthesiology.  Have LMOM TCB x1 for Dr Marianne Sofia office assistant @ [phone] ([place]) to have form faxed to [phone] to my attn.  Will continue to look for this form.  Called spoke with Maudie Mercury to discuss.  Per Maudie Mercury, pt was a little confused - a form is NOT needed, just a simply note or documentation stating that patient is cleared for surgery.  Maudie Mercury is requesting this be done by Thursday for upcoming appt.  Please fax to [phone] attn Dr Ronita Hipps or his PA Daryl.  Surgery in not until 10.18.18  Routing to myself and TP to ensure this gets taken care of tomorrow >> TP will the office note suffice, or you think a letter?  Thanks!

## 2017-06-03 NOTE — Assessment & Plan Note (Signed)
Compensated without flare  Stable lung function with no significant change since 02/2016.   Plan  Cont on current regimen.  follow up in 4 months and As needed

## 2017-06-03 NOTE — Telephone Encounter (Signed)
Spoke with Janett Billow about this matter as the pt saw TP this morning. Janett Billow requests that this message be routed to her.

## 2017-06-04 MED ORDER — FLUTICASONE-UMECLIDIN-VILANT 100-62.5-25 MCG/INH IN AEPB: 1.0000 | INHALATION_SPRAY | Freq: Every day | RESPIRATORY_TRACT | 0 refills | Status: DC

## 2017-06-04 NOTE — Telephone Encounter (Signed)
Per TP: okay for letter clearing patient FROM A PULMONARY STANDPOINT.  Please also include the information from the office visit.  Letter constructed

## 2017-06-04 NOTE — Telephone Encounter (Signed)
Per JJ: Letter is typed, signed, and ready for pickup.  I've placed letter in brown accordion file for pickup.  atc Kim to make aware, no answer, no vm.  Wcb.

## 2017-06-04 NOTE — Addendum Note (Signed)
Addended by: Parke Poisson E on: 06/04/2017 09:56 AM   Modules accepted: Orders

## 2017-06-04 NOTE — Telephone Encounter (Signed)
Spoke with pt's daughter in law Maudie Mercury (dpr on file), requesting the sx clearance letter to be ready today.  I advised that we were awaiting clarification from Dr. Marianne Sofia office as to what the letter needed to say, or if a particular form was needed, and we received that clarification approx 1 hour ago and that TP is currently on lunch.  Maudie Mercury is requesting that we call her when the letter is completed so she can pick this up.    TP/JJ please advise on letter.  Thanks!

## 2017-06-04 NOTE — Telephone Encounter (Signed)
Jamilian returned my call Discussed with her my conversation with Maudie Mercury from yesterday evening.  Timothy Lasso confirmed that a form is not needed, only a letter stating whether or not patient is cleared for surgery.  The office note will not suffice.  Forwarding to TP to assist with letter.

## 2017-06-04 NOTE — Progress Notes (Signed)
Called spoke with patient, advised of cxr results / recs as stated by TP.  Pt verbalized her understanding and denied any questions. 

## 2017-06-04 NOTE — Telephone Encounter (Signed)
Maudie Mercury [place],daughter-in-law is calling stating she needs to pick up this letter today.  CB is [phone].

## 2017-06-05 NOTE — Telephone Encounter (Signed)
Spoke with pt's daughter in Sports coach, Maudie Mercury. She is aware that this letter is ready for pick up. Nothing further was needed.

## 2017-06-06 DIAGNOSIS — E118 Type 2 diabetes mellitus with unspecified complications: Secondary | ICD-10-CM | POA: Diagnosis not present

## 2017-06-06 DIAGNOSIS — Z01818 Encounter for other preprocedural examination: Secondary | ICD-10-CM | POA: Diagnosis not present

## 2017-06-06 DIAGNOSIS — I739 Peripheral vascular disease, unspecified: Secondary | ICD-10-CM | POA: Diagnosis not present

## 2017-06-06 DIAGNOSIS — I10 Essential (primary) hypertension: Secondary | ICD-10-CM | POA: Diagnosis not present

## 2017-06-06 DIAGNOSIS — Z5189 Encounter for other specified aftercare: Secondary | ICD-10-CM | POA: Diagnosis not present

## 2017-06-06 DIAGNOSIS — K639 Disease of intestine, unspecified: Secondary | ICD-10-CM | POA: Diagnosis not present

## 2017-06-06 DIAGNOSIS — I619 Nontraumatic intracerebral hemorrhage, unspecified: Secondary | ICD-10-CM | POA: Diagnosis not present

## 2017-06-11 DIAGNOSIS — E118 Type 2 diabetes mellitus with unspecified complications: Secondary | ICD-10-CM | POA: Diagnosis not present

## 2017-06-11 DIAGNOSIS — I739 Peripheral vascular disease, unspecified: Secondary | ICD-10-CM | POA: Diagnosis not present

## 2017-06-11 DIAGNOSIS — J449 Chronic obstructive pulmonary disease, unspecified: Secondary | ICD-10-CM | POA: Diagnosis not present

## 2017-06-11 DIAGNOSIS — Z01810 Encounter for preprocedural cardiovascular examination: Secondary | ICD-10-CM | POA: Diagnosis not present

## 2017-06-11 DIAGNOSIS — I619 Nontraumatic intracerebral hemorrhage, unspecified: Secondary | ICD-10-CM | POA: Diagnosis not present

## 2017-06-11 DIAGNOSIS — Z8673 Personal history of transient ischemic attack (TIA), and cerebral infarction without residual deficits: Secondary | ICD-10-CM | POA: Diagnosis not present

## 2017-06-11 DIAGNOSIS — E785 Hyperlipidemia, unspecified: Secondary | ICD-10-CM | POA: Diagnosis not present

## 2017-06-11 DIAGNOSIS — K639 Disease of intestine, unspecified: Secondary | ICD-10-CM | POA: Diagnosis not present

## 2017-06-11 DIAGNOSIS — E119 Type 2 diabetes mellitus without complications: Secondary | ICD-10-CM | POA: Diagnosis not present

## 2017-06-11 DIAGNOSIS — I10 Essential (primary) hypertension: Secondary | ICD-10-CM | POA: Diagnosis not present

## 2017-06-13 ENCOUNTER — Ambulatory Visit: Payer: Medicare HMO | Admitting: Adult Health

## 2017-06-20 DIAGNOSIS — C189 Malignant neoplasm of colon, unspecified: Secondary | ICD-10-CM | POA: Diagnosis not present

## 2017-06-20 DIAGNOSIS — E785 Hyperlipidemia, unspecified: Secondary | ICD-10-CM | POA: Diagnosis not present

## 2017-06-20 DIAGNOSIS — E1151 Type 2 diabetes mellitus with diabetic peripheral angiopathy without gangrene: Secondary | ICD-10-CM | POA: Diagnosis not present

## 2017-06-20 DIAGNOSIS — E119 Type 2 diabetes mellitus without complications: Secondary | ICD-10-CM | POA: Diagnosis not present

## 2017-06-20 DIAGNOSIS — G8918 Other acute postprocedural pain: Secondary | ICD-10-CM | POA: Diagnosis not present

## 2017-06-20 DIAGNOSIS — E782 Mixed hyperlipidemia: Secondary | ICD-10-CM | POA: Diagnosis not present

## 2017-06-20 DIAGNOSIS — R4189 Other symptoms and signs involving cognitive functions and awareness: Secondary | ICD-10-CM | POA: Diagnosis not present

## 2017-06-20 DIAGNOSIS — Z87891 Personal history of nicotine dependence: Secondary | ICD-10-CM | POA: Diagnosis not present

## 2017-06-20 DIAGNOSIS — I10 Essential (primary) hypertension: Secondary | ICD-10-CM | POA: Diagnosis not present

## 2017-06-20 DIAGNOSIS — K580 Irritable bowel syndrome with diarrhea: Secondary | ICD-10-CM | POA: Diagnosis not present

## 2017-06-20 DIAGNOSIS — C180 Malignant neoplasm of cecum: Secondary | ICD-10-CM | POA: Diagnosis not present

## 2017-06-20 DIAGNOSIS — K6389 Other specified diseases of intestine: Secondary | ICD-10-CM | POA: Diagnosis not present

## 2017-06-20 DIAGNOSIS — Z9189 Other specified personal risk factors, not elsewhere classified: Secondary | ICD-10-CM | POA: Diagnosis not present

## 2017-06-20 DIAGNOSIS — Z8673 Personal history of transient ischemic attack (TIA), and cerebral infarction without residual deficits: Secondary | ICD-10-CM | POA: Diagnosis not present

## 2017-06-20 DIAGNOSIS — I69351 Hemiplegia and hemiparesis following cerebral infarction affecting right dominant side: Secondary | ICD-10-CM | POA: Diagnosis not present

## 2017-06-20 DIAGNOSIS — Z7189 Other specified counseling: Secondary | ICD-10-CM | POA: Diagnosis not present

## 2017-06-20 DIAGNOSIS — J449 Chronic obstructive pulmonary disease, unspecified: Secondary | ICD-10-CM | POA: Diagnosis not present

## 2017-07-02 ENCOUNTER — Ambulatory Visit (INDEPENDENT_AMBULATORY_CARE_PROVIDER_SITE_OTHER): Payer: Medicare HMO | Admitting: Internal Medicine

## 2017-07-02 ENCOUNTER — Encounter: Payer: Self-pay | Admitting: Internal Medicine

## 2017-07-02 DIAGNOSIS — I10 Essential (primary) hypertension: Secondary | ICD-10-CM

## 2017-07-02 DIAGNOSIS — E118 Type 2 diabetes mellitus with unspecified complications: Secondary | ICD-10-CM

## 2017-07-02 NOTE — Progress Notes (Signed)
   Subjective:    Patient ID: Sherry Lambert, female    DOB: [birth year], 76 y.o.   MRN: [id number]  HPI The patient is a 76 YO female coming in for BP problems. 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 She had an episode at home with dizziness and lightheadedness in the evening. She then fell down but denies injury or LOC from the fall. She did not take her lisinopril/hctz today and is still feeling a little lightheaded.  Review of Systems  Constitutional: Negative.   Respiratory: Negative for cough, chest tightness and shortness of breath.   Cardiovascular: Negative for chest pain, palpitations and leg swelling.  Gastrointestinal: Negative for abdominal distention, abdominal pain, constipation, diarrhea, nausea and vomiting.  Musculoskeletal: Negative.   Skin: Negative.   Neurological: Positive for dizziness, light-headedness and numbness. Negative for tremors and weakness.      Objective:   Physical Exam  Constitutional: She is oriented to person, place, and time. She appears well-developed and well-nourished.  HENT:  Head: Normocephalic and atraumatic.  Eyes: EOM are normal.  Neck: Normal range of motion.  Cardiovascular: Normal rate and regular rhythm.   Pulmonary/Chest: Effort normal and breath sounds normal. No respiratory distress. She has no wheezes. She has no rales.  Abdominal: Soft. Bowel sounds are normal. She exhibits no distension. There is no tenderness. There is no rebound.  Musculoskeletal: She exhibits no edema.  Neurological: She is alert and oriented to person, place, and time. Coordination abnormal.  Wheeled walker for ambulation  Skin: Skin is warm and dry.   Vitals:   07/02/17 1546  BP: (!) 100/50  Pulse: 96  Temp: 98.1 F (36.7 C)  TempSrc: Oral  SpO2: 99%  Weight: 111 lb (50.3 kg)  Height: 5' (1.524 m)      Assessment & Plan:

## 2017-07-02 NOTE — Assessment & Plan Note (Signed)
Patient states she was told by [facility] to get HgA1c but informed her that this is not due and she is well controlled.

## 2017-07-02 NOTE — Patient Instructions (Signed)
You can try warm liquids for the voice.   Stop taking lisinopril/hctz as this is making the blood pressure too low.

## 2017-07-02 NOTE — Assessment & Plan Note (Signed)
BP low today and dizziness at home. Will D/C lisinopril/hctz today and monitor BP closely. Could be related to some GI changes with recent surgery and poor appetite for several weeks afterwards.

## 2017-07-09 DIAGNOSIS — C180 Malignant neoplasm of cecum: Secondary | ICD-10-CM | POA: Diagnosis not present

## 2017-07-12 DIAGNOSIS — M25561 Pain in right knee: Secondary | ICD-10-CM | POA: Diagnosis not present

## 2017-07-12 DIAGNOSIS — M79671 Pain in right foot: Secondary | ICD-10-CM | POA: Diagnosis not present

## 2017-07-12 DIAGNOSIS — G8929 Other chronic pain: Secondary | ICD-10-CM | POA: Diagnosis not present

## 2017-07-17 DIAGNOSIS — Z85038 Personal history of other malignant neoplasm of large intestine: Secondary | ICD-10-CM | POA: Diagnosis not present

## 2017-07-17 DIAGNOSIS — K639 Disease of intestine, unspecified: Secondary | ICD-10-CM | POA: Diagnosis not present

## 2017-07-17 DIAGNOSIS — C182 Malignant neoplasm of ascending colon: Secondary | ICD-10-CM | POA: Diagnosis not present

## 2017-07-19 ENCOUNTER — Telehealth: Payer: Self-pay | Admitting: Hematology

## 2017-07-19 NOTE — Telephone Encounter (Signed)
Received a call from [place] at [facility] to schedule the pt an oncology appt closer to home. Pt has been scheduled for the pt to see Dr. Burr Medico on 11/20 at 230pm. Olivia Mackie will notify the pt.

## 2017-07-22 NOTE — Progress Notes (Signed)
  Oncology Nurse Navigator Documentation  Navigator Location: [facility] (07/22/17 1221) Referral date to RadOnc/MedOnc: 07/19/17 (07/22/17 1221) )Navigator Encounter Type: Introductory phone call;Telephone (07/22/17 1221) Telephone: [place] Call (07/22/17 1221) Abnormal Finding Date: 05/28/17 (07/22/17 1221) Confirmed Diagnosis Date: 05/28/17 (07/22/17 1221) Surgery Date: 06/20/17 (07/22/17 1221)           Treatment Initiated Date: 06/20/17 (07/22/17 1221)   Treatment Phase: Other(second opinion) (07/22/17 1221)     Interventions: Education;Psycho-social support (07/22/17 1221)  I called patient to introduce myself and to explain my role as GI  Oncology navigator. I confirmed appointment time and date and let patient know that [facility] has valet parking and had wheelchairs for patient use. Patient reports that her son will be brining her to her appointment on 07/23/17. I plan on meeting patient at her appointment with Dr. Burr Medico on 07/23/17. Education Method: Verbal (07/22/17 1221)      Acuity: Level 1 (07/22/17 1221) Acuity Level 1: Initial guidance, education and coordination as needed;Minimal follow up required (07/22/17 1221)       Time Spent with Patient: 15 (07/22/17 1221)

## 2017-07-23 ENCOUNTER — Encounter: Payer: Self-pay | Admitting: Nurse Practitioner

## 2017-07-23 ENCOUNTER — Ambulatory Visit: Payer: Medicare HMO | Admitting: Nurse Practitioner

## 2017-07-23 ENCOUNTER — Telehealth: Payer: Self-pay | Admitting: Internal Medicine

## 2017-07-23 ENCOUNTER — Telehealth: Payer: Self-pay | Admitting: Nurse Practitioner

## 2017-07-23 VITALS — BP 154/65 | HR 85 | Temp 98.0°F | Resp 18 | Ht 60.0 in | Wt 114.8 lb

## 2017-07-23 DIAGNOSIS — I639 Cerebral infarction, unspecified: Secondary | ICD-10-CM

## 2017-07-23 DIAGNOSIS — Z800 Family history of malignant neoplasm of digestive organs: Secondary | ICD-10-CM

## 2017-07-23 DIAGNOSIS — J449 Chronic obstructive pulmonary disease, unspecified: Secondary | ICD-10-CM

## 2017-07-23 DIAGNOSIS — I10 Essential (primary) hypertension: Secondary | ICD-10-CM

## 2017-07-23 DIAGNOSIS — E119 Type 2 diabetes mellitus without complications: Secondary | ICD-10-CM | POA: Diagnosis not present

## 2017-07-23 DIAGNOSIS — Z87891 Personal history of nicotine dependence: Secondary | ICD-10-CM

## 2017-07-23 DIAGNOSIS — C180 Malignant neoplasm of cecum: Secondary | ICD-10-CM | POA: Insufficient documentation

## 2017-07-23 HISTORY — DX: Malignant neoplasm of cecum: C18.0

## 2017-07-23 NOTE — Telephone Encounter (Signed)
Okay to stay off the medication.

## 2017-07-23 NOTE — Telephone Encounter (Signed)
Gave avs and calendar for November and March 2019

## 2017-07-23 NOTE — Telephone Encounter (Signed)
Pt called stating that Dr [name] took her off of her BP medication. She has noticed some fluid retention in her feet and ankles since being off of the medication but her blood pressure has been doing good without it. Please advise.

## 2017-07-23 NOTE — Telephone Encounter (Signed)
LVM informing patient of MD response  

## 2017-07-23 NOTE — Progress Notes (Addendum)
[place]  [phone] Fax:([phone] [place] Note   Patient Care Team: [name], MD as PCP - General (Internal Medicine) 07/23/2017  CHIEF COMPLAINTS/PURPOSE OF CONSULTATION:  Adenocarcinoma of the cecum  Referring Physician: Dr. Dennison Nancy, [facility]    Adenocarcinoma of cecum [facility])   12/05/2016 Imaging    CT CAP IMPRESSION: Right middle lobe atelectasis.  Moderate size hiatal hernia.  Aortic atherosclerosis.  Irregular solid density seen in tip of cecum most consistent with residual stool, but further evaluation with barium enema or colonoscopy is recommended to rule out mass.      03/05/2017 Procedure    Colonoscopy per Dr. Silverio Decamp  The digital rectal exam revealed a rectal mass. The mass was non-circumferential and located predominantly at the posterior bowel wall. Findings: A frond-like/villous, infiltrative and ulcerated partially obstructing large mass was found in the cecum. The mass was partially circumferential (involving two-thirds of the lumen circumference). The mass measured seven cm in length. In addition, its diameter measured >thirty mm. Oozing was present. This was biopsied with a cold jumbo forceps for histology. A frond-like/villous non-obstructing medium-sized mass was found in the distal rectum within 5 cm from anal verge. The mass was non-circumferential. The mass measured two cm in length. In addition, its diameter measured 20 mm. No bleeding was present. This was biopsied with a cold forceps for histology.      03/07/2017 Pathology Results    Diagnosis 1. Colon, biopsy, cecal - TUBULOVILLOUS ADENOMA (3 OF 3 FRAGMENTS) - NO HIGH GRADE DYSPLASIA OR MALIGNANCY IDENTIFIED 2. Rectum, biopsy - TUBULOVILLOUS ADENOMA (MULTIPLE FRAGMENTS) - NO HIGH GRADE DYSPLASIA OR MALIGNANCY IDENTIFIED      05/28/2017 Procedure    Colonoscopy per Dr. Mont Dutton at [facility]   The perianal and digital rectal exams  were normal.  A polypoid non--obstructing large mass was found in the cecum.  The mass was circumferential (involving 100% of the lumen circumference).  In addition, the diameter measured 5-7 mm biopsies were taken with cold forceps for histology.  A 20 mm polyp was found in the rectum (benign-appearing lesion).  The polyp was sessile.  Preparations were made for mucosal resection.  Piecemeal mucosal resection using a snare was performed.  Resection and retrieval were complete.        05/28/2017 Pathology Results    Specimens: A) - Colon, cecal mass bxs at/et   B) - Rectum, rectal polyp EMR at/et    DIAGNOSIS A.  Cecal mass, endoscopic biopsy:  Tubulovillous adenoma with high grade dysplasia.  No evidence of invasive carcinoma.  B.  Rectal polyp, endoscopic mucosal resection:  Tubulovillous adenoma with high grade dysplasia.  No evidence of invasive carcinoma. Surgical margin is free of high grade dysplasia.   Clinical Information Rectal mass, cecal mass.  The colonoscopy impression:Polypoid mass in the cecum. Biopsied. - One benign appearing 20 mm polyp in the rectum piecemeal removed with mucosal resection. Resected and retrieved. - Mucosal resection was performed. Resection and retrieval were complete.          06/20/2017 Initial Diagnosis    Pathology results from right hemicolectomy  A.  Terminal ileum, appendix, right colon, resection: Invasive colonic adenocarcinoma, moderately differentiated Surgical margins are free of tumor 18 lymph nodes, negative for carcinoma (0/18)  Tumor site: cecum Histologic type: Adenocarcinoma Histologic grade: Moderately differentiated, G2 Tumor size: Greatest dimension in centimeters: 3.9 cm Tumor Extension: Tumor invades through the muscularis propria into pericolorectal rectal tissue Macroscopic tumor perforation not  identified Lymphovascular invasion not identified Perineural invasion not identified All margins are uninvolved by invasive carcinoma, high-grade dysplasia, intramucosal adenocarcinoma, and adenoma   Primary tumor pT3 Regional lymph nodes pN0  MSI stable      06/20/2017 Procedure    Surgeries Performed: Procedure(s): LAPAROSCOPY, SURGICAL; RIGHT COLECTOMY, PARTIAL, WITH ANASTOMOSIS laparoscopic possible right hemicolectomy per Dr. Ronita Hipps       06/20/2017 Initial Diagnosis    Adenocarcinoma of cecum ([place])      HISTORY OF PRESENTING ILLNESS:  Carollee Massed 76 y.o. female is here with her daughter in law because of newly diagnosed colon cancer. She was referred by Dr. Dennison Nancy at [facility]. Ms. Blystone was in her usual state of health until approx 12/2016 when she developed persistent lower abdominal pain, diarrhea, and weight loss over several weeks. She went to GI who ordered CT A/P which showed an irregular solid density seen in the tip of the cecum, most consistent with residual stool but mass could not be ruled out. Colonoscopy in 03/2017 revealed rectal mass on digital exam and partially obstructing large mass in the cecum. Rectal and cecal biopsies revealed tubulovillous adenoma, negative for malignancy. She was then referred to [facility].  She underwent repeated colonoscopy with EUS on 05/28/2017; pathology was again negative for malignancy.  She was taken to the OR on 06/21/2017 for laparoscopic right partial colectomy with anastomosis.  Surgical pathology revealed invasive colon adenocarcinoma of the cecum.  She was discharged home and has recovered well, denies pain, BMs nearly back to normal with formed stool every other day if not daily.  Denies nausea, vomiting, bloating, or blood in stool; she takes fiber supplement.   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 Sustained a fall at home after colon surgery and was found to be hypotensive; PCP then stopped BP medication, she has been stable since then. She is a former cigarette smoker, 1 PPD x55 years; quit in 2004. She is retired from Special educational needs teacher, lives alone with supportive family close by. She ambulates with a walker, does not drive, but is otherwise independent of ADL's. She does light exercise frequently with her walker.   MEDICAL HISTORY:  Past Medical History:  Diagnosis Date  . Adenocarcinoma of cecum ([place]) 07/23/2017  . [name]   . Cataract   . Chronic kidney disease    kidney stone  . Colitis   . Colon cancer ([place]) 06/2017   cecum  . Diabetes mellitus without complication ([place])    no meds taken  . Emphysema of lung ([place])   . GERD (gastroesophageal reflux disease)   . Hyperlipidemia   . Hypertension   . Peripheral arterial disease ([place])   . [name]   . Stroke [facility])    November 2016- Thanksgiving night    SURGICAL HISTORY: Past Surgical History:  Procedure Laterality Date  . ABDOMINAL HYSTERECTOMY    . BALLOON DILATION N/A 12/23/2012   Procedure: BALLOON DILATION;  Surgeon: Inda Castle, MD;  Location: [facility];  Service: Endoscopy;  Laterality: N/A;  . BALLOON DILATION N/A 01/15/2013   Procedure: BALLOON DILATION;  Surgeon: Inda Castle, MD;  Location: [facility];  Service: Endoscopy;  Laterality: N/A;  . cataract     both eyes  . COLONOSCOPY    . COLONOSCOPY WITH PROPOFOL N/A 03/05/2017  Procedure: COLONOSCOPY WITH PROPOFOL;  Surgeon: Mauri Pole, MD;  Location: [facility];  Service: Endoscopy;  Laterality: N/A;  . DENTAL RESTORATION/EXTRACTION WITH X-RAY    . ESOPHAGOGASTRODUODENOSCOPY N/A 12/23/2012   Procedure: ESOPHAGOGASTRODUODENOSCOPY (EGD);  Surgeon: Inda Castle, MD;  Location: [facility];  Service: Endoscopy;  Laterality: N/A;  . ESOPHAGOGASTRODUODENOSCOPY N/A 01/15/2013     Procedure: ESOPHAGOGASTRODUODENOSCOPY (EGD);  Surgeon: Inda Castle, MD;  Location: [facility];  Service: Endoscopy;  Laterality: N/A;  . TONSILLECTOMY      SOCIAL HISTORY: Social History   Socioeconomic History  . Marital status: Divorced    Spouse name: Not on file  . Number of children: 1  . Years of education: Not on file  . Highest education level: Not on file  Social Needs  . Financial resource strain: Not on file  . Food insecurity - worry: Not on file  . Food insecurity - inability: Not on file  . Transportation needs - medical: Not on file  . Transportation needs - non-medical: Not on file  Occupational History  . Occupation: Retired    Comment: Tax inspector  Tobacco Use  . Smoking status: Former Smoker    Packs/day: 1.00    Years: 55.00    Pack years: 55.00    Types: Cigarettes    Last attempt to quit: 10/19/2002    Years since quitting: 14.7  . Smokeless tobacco: Never Used  Substance and Sexual Activity  . Alcohol use: No    Alcohol/week: 0.0 oz  . Drug use: No  . Sexual activity: No    Birth control/protection: Surgical  Other Topics Concern  . Not on file  Social History Narrative   Regular exercise-no   Caffeine Use-yes    FAMILY HISTORY: Family History  Problem Relation Age of Onset  . Hypertension Father   . Diabetes Father   . Cancer Father        melanoma  . Colon cancer Sister 70  . Cancer Sister        colon, surgery alone   . Early death Neg Hx   . Stroke Neg Hx   . Esophageal cancer Neg Hx   . Rectal cancer Neg Hx   . Stomach cancer Neg Hx     ALLERGIES:  is allergic to lipitor [atorvastatin].  MEDICATIONS:  Current Outpatient Medications  Medication Sig Dispense Refill  . albuterol (PROVENTIL) (2.5 MG/3ML) 0.083% nebulizer solution Take 2.5 mg by nebulization every 6 (six) hours as needed for wheezing or shortness of breath.    Marland Kitchen aspirin EC 81 MG tablet Take 1 tablet (81 mg total) by mouth daily. 90 tablet 3  .  doxylamine, Sleep, (SOBA NIGHTTIME SLEEP AID) 25 MG tablet Take 25 mg by mouth at bedtime as needed (for sleep.).    Marland Kitchen Fluticasone-Umeclidin-Vilant (TRELEGY ELLIPTA) 100-62.5-25 MCG/INH AEPB Inhale 1 puff into the lungs daily. 1 each 11  . pravastatin (PRAVACHOL) 20 MG tablet Take 1 tablet (20 mg total) by mouth daily. 90 tablet 3  . diphenhydrAMINE (BENADRYL) 25 mg capsule Take by mouth.     No current facility-administered medications for this visit.     REVIEW OF SYSTEMS:   Constitutional: Denies fatigue, fevers, chills or abnormal night sweats (+) weight loss at diagnosis; now stable Eyes: Denies blurriness of vision, double vision or watery eyes Ears, nose, mouth, throat, and face: Denies mucositis or sore throat Respiratory: Denies cough, dyspnea or wheezes (+) COPD well controlled  Cardiovascular: Denies palpitation, chest discomfort  or lower extremity swelling Gastrointestinal:  Denies nausea, vomiting, constipation, diarrhea, bloating, ABD pain, blood in stool, heartburn or change in bowel habits (+) BM every other day to daily; sometimes 2 per day (+) laparoscopic colectomy incisions x4  Skin: Denies abnormal skin rashes Lymphatics: Denies new lymphadenopathy or easy bruising Neurological: (+) s/p CVA with right side neuropathy and mild weakness  Behavioral/Psych: Mood is stable, no new changes  All other systems were reviewed with the patient and are negative.  PHYSICAL EXAMINATION: ECOG PERFORMANCE STATUS: 1 - Symptomatic but completely ambulatory  Vitals:   07/23/17 1450  BP: (!) 154/65  Pulse: 85  Resp: 18  Temp: 98 F (36.7 C)  SpO2: 96%   Filed Weights   07/23/17 1450  Weight: 114 lb 12.8 oz (52.1 kg)    GENERAL:alert, no distress and comfortable SKIN: skin color, texture, turgor are normal, no rashes or significant lesions EYES: normal, conjunctiva are pink and non-injected, sclera clear OROPHARYNX:no exudate, no erythema and lips, buccal mucosa, and tongue  normal  NECK: supple, thyroid normal size, non-tender, without nodularity LYMPH:  no palpable cervical, supraclavicular, or axillary lymphadenopathy LUNGS: clear to auscultation bilaterally with normal breathing effort HEART: regular rate & rhythm and no murmurs and no lower extremity edema ABDOMEN:abdomen soft, non-tender and normal bowel sounds. (+) umbilical and abdominal incisions x4 healing well; no erythema or drainage  Musculoskeletal:no cyanosis of digits and no clubbing  PSYCH: alert & oriented x 3 with fluent speech NEURO: no focal sensory deficits (+) slight RLE weakness, strength 4/5; LLE strength 5/5; upper extremities 5/5 bilaterally  LABORATORY DATA:  I have reviewed the data as listed CBC Latest Ref Rng & Units 05/23/2017 01/17/2017 11/14/2016  WBC 4.0 - 10.5 K/uL 10.6(H) 10.5 8.2  Hemoglobin 12.0 - 15.0 g/dL 13.2 13.9 10.6(L)  Hematocrit 36.0 - 46.0 % 41.0 42.5 33.4(L)  Platelets 150.0 - 400.0 K/uL 301.0 286.0 212   CMP Latest Ref Rng & Units 05/23/2017 03/05/2017 01/17/2017  Glucose 70 - 99 mg/dL 94 125(H) 114(H)  BUN 6 - 23 mg/dL 28(H) 24(H) 20  Creatinine 0.40 - 1.20 mg/dL 1.08 1.11(H) 1.02  Sodium 135 - 145 mEq/L 139 135 138  Potassium 3.5 - 5.1 mEq/L 4.5 4.2 4.7  Chloride 96 - 112 mEq/L 106 101 102  CO2 19 - 32 mEq/L '27 25 28  ' Calcium 8.4 - 10.5 mg/dL 9.9 9.0 10.6(H)  Total Protein 6.0 - 8.3 g/dL 7.2 - -  Total Bilirubin 0.2 - 1.2 mg/dL 0.4 - -  Alkaline Phos 39 - 117 U/L 55 - -  AST 0 - 37 U/L 18 - -  ALT 0 - 35 U/L 13 - -    RADIOGRAPHIC STUDIES: I have personally reviewed the radiological images as listed and agreed with the findings in the report. No results found.  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 Referred from [facility] for management of adenocarcinoma of the  cecum  1. Invasive colonic adenocarcinoma of the cecum, moderately differentiated G2; pT3, pN0, cM0, MSI-stable; stage IIb We discussed her imaging and pathology findings in detail with the patient and family. She has moderately differentiated T3 invasive adenocarcinoma of the cecum that extends through the muscularis propria. Macroscopic tumor perforation, lymphovascular invasion, and perineural invasion are not identified. 18 lymph nodes were sampled, all negative; all margins were  clear.  Due to these features she has low risk of recurrence after surgery; adjuvant chemotherapy is not recommended in her stage IIb disease. We discussed indications for adjuvant chemotherapy such as T4 lesion, poor differentiation, positive LN; which do not apply. We discussed cancer surveillance including physical exam and labs, including CBC, Cmet, and CEA, every 4 months for 1 year, then every 6 months for up to 5 years. She will get colonoscopy and CT CAP at one year from diagnosis; will likely repeat CT annually x2 or as clinical picture warrants. We discussed signs or symptoms that would require sooner f/u, including change in bowel habits, unexplained weight loss, blood in stool, or abdominal pain or bloating. She maintains close f/u with PCP in town. She cannot stay for lab work today, will arrange to be done next week to obtain baseline CEA. She will return in 4 months.   PLAN:  Labs next week; CBC, Cmet, CEA Return in 4 months for cancer surveillance    Orders Placed This Encounter  Procedures  . CBC with Differential    Standing Status:   Standing    Number of Occurrences:   100    Standing Expiration Date:   07/23/2018  . Comprehensive metabolic panel    Standing Status:   Standing    Number of Occurrences:   100    Standing Expiration Date:   07/23/2018  . CEA    Standing Status:   Standing    Number of Occurrences:   100    Standing Expiration Date:   07/23/2018    All questions were answered.  The patient knows to call the clinic with any problems, questions or concerns. I spent 40 minutes counseling the patient face to face. The total time spent in the appointment was 45 minutes and more than 50% was on counseling, review of test results, and coordination of care.     Alla Feeling, NP 07/23/2017 3:56 PM  I have seen the patient, examined her. I agree with the assessment and and plan and have edited the notes.   Ms ellwood is a 76 year old Caucasian female, recently diagnosed with stage IIB cecum colon cancer.  She has recovered well from surgery.  I reviewed her surgical pathology findings with the patient and her daughter-in-law in details, she does not have any high risk features, the benefit of adjuvant chemotherapy in this setting is controversial. I do not recommend adjuvant chemotherapy.  We reviewed cancer surveillance plan in details.  She will had a surveillance CT scan and colonoscopy in 1 year (06/2018). We will see her back in 4 months for f/u, lab and exam.   Truitt Merle  07/23/2017

## 2017-07-30 ENCOUNTER — Telehealth: Payer: Self-pay | Admitting: Emergency Medicine

## 2017-07-30 ENCOUNTER — Other Ambulatory Visit (HOSPITAL_BASED_OUTPATIENT_CLINIC_OR_DEPARTMENT_OTHER): Payer: Medicare HMO

## 2017-07-30 DIAGNOSIS — C180 Malignant neoplasm of cecum: Secondary | ICD-10-CM

## 2017-07-30 LAB — COMPREHENSIVE METABOLIC PANEL
ALK PHOS: 55 U/L (ref 40–150)
ALT: 10 U/L (ref 0–55)
ANION GAP: 9 meq/L (ref 3–11)
AST: 14 U/L (ref 5–34)
Albumin: 3.4 g/dL — ABNORMAL LOW (ref 3.5–5.0)
BILIRUBIN TOTAL: 0.31 mg/dL (ref 0.20–1.20)
BUN: 13.1 mg/dL (ref 7.0–26.0)
CALCIUM: 9.2 mg/dL (ref 8.4–10.4)
CO2: 24 mEq/L (ref 22–29)
CREATININE: 0.8 mg/dL (ref 0.6–1.1)
Chloride: 110 mEq/L — ABNORMAL HIGH (ref 98–109)
Glucose: 89 mg/dl (ref 70–140)
Potassium: 3.6 mEq/L (ref 3.5–5.1)
Sodium: 142 mEq/L (ref 136–145)
TOTAL PROTEIN: 6.5 g/dL (ref 6.4–8.3)

## 2017-07-30 LAB — CBC WITH DIFFERENTIAL/PLATELET
BASO%: 1 % (ref 0.0–2.0)
Basophils Absolute: 0.1 10*3/uL (ref 0.0–0.1)
EOS%: 1.4 % (ref 0.0–7.0)
Eosinophils Absolute: 0.1 10*3/uL (ref 0.0–0.5)
HCT: 36.1 % (ref 34.8–46.6)
HEMOGLOBIN: 11.4 g/dL — AB (ref 11.6–15.9)
LYMPH%: 20.3 % (ref 14.0–49.7)
MCH: 28 pg (ref 25.1–34.0)
MCHC: 31.6 g/dL (ref 31.5–36.0)
MCV: 88.6 fL (ref 79.5–101.0)
MONO#: 0.5 10*3/uL (ref 0.1–0.9)
MONO%: 7 % (ref 0.0–14.0)
NEUT%: 70.3 % (ref 38.4–76.8)
NEUTROS ABS: 5.3 10*3/uL (ref 1.5–6.5)
Platelets: 252 10*3/uL (ref 145–400)
RBC: 4.07 10*6/uL (ref 3.70–5.45)
RDW: 14.3 % (ref 11.2–14.5)
WBC: 7.5 10*3/uL (ref 3.9–10.3)
lymph#: 1.5 10*3/uL (ref 0.9–3.3)

## 2017-07-30 LAB — CEA (IN HOUSE-CHCC): CEA (CHCC-IN HOUSE): 2.15 ng/mL (ref 0.00–5.00)

## 2017-07-30 NOTE — Telephone Encounter (Addendum)
Pt verbalized understanding of this note.   ----- Message from Alla Feeling, NP sent at 07/30/2017 12:51 PM EST ----- Please let her know CEA is normal, see her in March, sooner if she has concerns or questions. thanks

## 2017-08-20 ENCOUNTER — Telehealth: Payer: Self-pay

## 2017-08-20 NOTE — Telephone Encounter (Signed)
Pt. Called to report her BP is 143/63 and her ankles "are a little swollen." Reports she is watching salt intake and propping her legs up. States doctor said she would put her back on BP medication if her BP increased. Pt. Also request medication for her edema.

## 2017-08-20 NOTE — Telephone Encounter (Signed)
States that she doesn't see someone till march for a visit regarding the cancer and has not started any treatments. Wants to know if that changes anything.

## 2017-08-20 NOTE — Telephone Encounter (Signed)
No that's still fine

## 2017-08-20 NOTE — Telephone Encounter (Signed)
Informed patient of MD response. States she is going to start keeping a BP log and keep an eye on it

## 2017-08-20 NOTE — Telephone Encounter (Signed)
Given her ongoing cancer treatment she should be evaluated for the edema. I would not resume her BP medication for that blood pressure.

## 2017-09-20 ENCOUNTER — Telehealth: Payer: Self-pay

## 2017-09-20 MED ORDER — LISINOPRIL 20 MG PO TABS: 20.0000 mg | ORAL_TABLET | Freq: Every day | ORAL | 3 refills | Status: DC

## 2017-09-20 NOTE — Telephone Encounter (Signed)
Copied from [place] [id number]. Topic: General - Other >> Sep 20, 2017  8:43 AM Carolyn Stare wrote:  Pt call to say Dr [name] took her off her bp med and told her to monitor her bp and if if goes up tp let her know. Pt call to say her bp is going up and would like a call back she was taking   [place]

## 2017-09-20 NOTE — Telephone Encounter (Signed)
Called patient to get her reading and they were:  Last night 175/? Then got 150/?   Today 157/78, 148/81 P69, 156/78 P70

## 2017-09-20 NOTE — Telephone Encounter (Signed)
She should go back on lisinopiril 20 mg daily. Rx sent to her pharmacy.

## 2017-09-20 NOTE — Telephone Encounter (Signed)
Patient informed. 

## 2017-10-23 ENCOUNTER — Other Ambulatory Visit: Payer: Self-pay | Admitting: Internal Medicine

## 2017-11-05 DIAGNOSIS — E785 Hyperlipidemia, unspecified: Secondary | ICD-10-CM | POA: Diagnosis not present

## 2017-11-05 DIAGNOSIS — J439 Emphysema, unspecified: Secondary | ICD-10-CM | POA: Diagnosis not present

## 2017-11-05 DIAGNOSIS — G8929 Other chronic pain: Secondary | ICD-10-CM | POA: Diagnosis not present

## 2017-11-05 DIAGNOSIS — I69351 Hemiplegia and hemiparesis following cerebral infarction affecting right dominant side: Secondary | ICD-10-CM | POA: Diagnosis not present

## 2017-11-05 DIAGNOSIS — R69 Illness, unspecified: Secondary | ICD-10-CM | POA: Diagnosis not present

## 2017-11-05 DIAGNOSIS — H547 Unspecified visual loss: Secondary | ICD-10-CM | POA: Diagnosis not present

## 2017-11-05 DIAGNOSIS — E1151 Type 2 diabetes mellitus with diabetic peripheral angiopathy without gangrene: Secondary | ICD-10-CM | POA: Diagnosis not present

## 2017-11-05 DIAGNOSIS — C189 Malignant neoplasm of colon, unspecified: Secondary | ICD-10-CM | POA: Diagnosis not present

## 2017-11-05 DIAGNOSIS — J309 Allergic rhinitis, unspecified: Secondary | ICD-10-CM | POA: Diagnosis not present

## 2017-11-05 DIAGNOSIS — H04129 Dry eye syndrome of unspecified lacrimal gland: Secondary | ICD-10-CM | POA: Diagnosis not present

## 2017-11-20 ENCOUNTER — Telehealth: Payer: Self-pay | Admitting: Hematology

## 2017-11-20 ENCOUNTER — Inpatient Hospital Stay: Payer: Medicare HMO | Attending: Nurse Practitioner | Admitting: Nurse Practitioner

## 2017-11-20 ENCOUNTER — Inpatient Hospital Stay: Payer: Medicare HMO

## 2017-11-20 ENCOUNTER — Encounter: Payer: Self-pay | Admitting: Nurse Practitioner

## 2017-11-20 VITALS — BP 167/65 | HR 78 | Temp 98.2°F | Resp 16 | Ht 60.0 in | Wt 116.8 lb

## 2017-11-20 DIAGNOSIS — I10 Essential (primary) hypertension: Secondary | ICD-10-CM

## 2017-11-20 DIAGNOSIS — C180 Malignant neoplasm of cecum: Secondary | ICD-10-CM

## 2017-11-20 DIAGNOSIS — R609 Edema, unspecified: Secondary | ICD-10-CM | POA: Diagnosis not present

## 2017-11-20 DIAGNOSIS — E876 Hypokalemia: Secondary | ICD-10-CM | POA: Diagnosis not present

## 2017-11-20 DIAGNOSIS — J449 Chronic obstructive pulmonary disease, unspecified: Secondary | ICD-10-CM | POA: Insufficient documentation

## 2017-11-20 DIAGNOSIS — E119 Type 2 diabetes mellitus without complications: Secondary | ICD-10-CM | POA: Diagnosis not present

## 2017-11-20 LAB — COMPREHENSIVE METABOLIC PANEL
ALT: 11 U/L (ref 0–55)
AST: 14 U/L (ref 5–34)
Albumin: 3.7 g/dL (ref 3.5–5.0)
Alkaline Phosphatase: 86 U/L (ref 40–150)
Anion gap: 9 (ref 3–11)
BUN: 14 mg/dL (ref 7–26)
CHLORIDE: 108 mmol/L (ref 98–109)
CO2: 26 mmol/L (ref 22–29)
CREATININE: 0.86 mg/dL (ref 0.60–1.10)
Calcium: 10.2 mg/dL (ref 8.4–10.4)
GFR calc Af Amer: >60 mL/min (ref >60)
Glucose, Bld: 106 mg/dL (ref 70–140)
Potassium: 3.3 mmol/L — ABNORMAL LOW (ref 3.5–5.1)
Sodium: 143 mmol/L (ref 136–145)
Total Bilirubin: 0.4 mg/dL (ref 0.2–1.2)
Total Protein: 7.1 g/dL (ref 6.4–8.3)

## 2017-11-20 LAB — CBC WITH DIFFERENTIAL/PLATELET
Basophils Absolute: 0.1 10*3/uL (ref 0.0–0.1)
Basophils Relative: 1 %
EOS ABS: 0.1 10*3/uL (ref 0.0–0.5)
EOS PCT: 1 %
HCT: 41.7 % (ref 34.8–46.6)
Hemoglobin: 13.5 g/dL (ref 11.6–15.9)
LYMPHS ABS: 1.9 10*3/uL (ref 0.9–3.3)
Lymphocytes Relative: 20 %
MCH: 27.2 pg (ref 25.1–34.0)
MCHC: 32.5 g/dL (ref 31.5–36.0)
MCV: 83.9 fL (ref 79.5–101.0)
MONO ABS: 0.6 10*3/uL (ref 0.1–0.9)
MONOS PCT: 6 %
Neutro Abs: 6.8 10*3/uL — ABNORMAL HIGH (ref 1.5–6.5)
Neutrophils Relative %: 72 %
PLATELETS: 233 10*3/uL (ref 145–400)
RBC: 4.97 MIL/uL (ref 3.70–5.45)
RDW: 16.1 % — AB (ref 11.2–14.5)
WBC: 9.5 10*3/uL (ref 3.9–10.3)

## 2017-11-20 LAB — CEA (IN HOUSE-CHCC): CEA (CHCC-IN HOUSE): 2.32 ng/mL (ref 0.00–5.00)

## 2017-11-20 NOTE — Telephone Encounter (Signed)
Appointments scheduled AVS/Calendar printed per 3/20 los 

## 2017-11-20 NOTE — Progress Notes (Signed)
[place]  [phone] [phone]  Clinic Follow up Note   Patient Care Team: [name], MD as PCP - General (Internal Medicine) 11/20/2017  SUMMARY OF ONCOLOGIC HISTORY: Oncology History   Cancer Staging Adenocarcinoma of cecum [facility]) Staging form: Colon and Rectum - Neuroendocine Tumors, AJCC 8th Edition - Pathologic stage from 06/20/2017: Stage IIB (pT3, pN0, cM0) - Unsigned - Clinical: No stage assigned - Unsigned       Adenocarcinoma of cecum ([place])   12/05/2016 Imaging    CT CAP IMPRESSION: Right middle lobe atelectasis.  Moderate size hiatal hernia.  Aortic atherosclerosis.  Irregular solid density seen in tip of cecum most consistent with residual stool, but further evaluation with barium enema or colonoscopy is recommended to rule out mass.      03/05/2017 Procedure    Colonoscopy per Dr. Silverio Decamp  The digital rectal exam revealed a rectal mass. The mass was non-circumferential and located predominantly at the posterior bowel wall. Findings: A frond-like/villous, infiltrative and ulcerated partially obstructing large mass was found in the cecum. The mass was partially circumferential (involving two-thirds of the lumen circumference). The mass measured seven cm in length. In addition, its diameter measured >thirty mm. Oozing was present. This was biopsied with a cold jumbo forceps for histology. A frond-like/villous non-obstructing medium-sized mass was found in the distal rectum within 5 cm from anal verge. The mass was non-circumferential. The mass measured two cm in length. In addition, its diameter measured 20 mm. No bleeding was present. This was biopsied with a cold forceps for histology.      03/07/2017 Pathology Results    Diagnosis 1. Colon, biopsy, cecal - TUBULOVILLOUS ADENOMA (3 OF 3 FRAGMENTS) - NO HIGH GRADE DYSPLASIA OR MALIGNANCY IDENTIFIED 2. Rectum, biopsy - TUBULOVILLOUS ADENOMA (MULTIPLE  FRAGMENTS) - NO HIGH GRADE DYSPLASIA OR MALIGNANCY IDENTIFIED      05/28/2017 Procedure    Colonoscopy per Dr. Mont Dutton at [facility]   The perianal and digital rectal exams were normal.  A polypoid non--obstructing large mass was found in the cecum.  The mass was circumferential (involving 100% of the lumen circumference).  In addition, the diameter measured 5-7 mm biopsies were taken with cold forceps for histology.  A 20 mm polyp was found in the rectum (benign-appearing lesion).  The polyp was sessile.  Preparations were made for mucosal resection.  Piecemeal mucosal resection using a snare was performed.  Resection and retrieval were complete.        05/28/2017 Pathology Results    Specimens: A) - Colon, cecal mass bxs at/et   B) - Rectum, rectal polyp EMR at/et    DIAGNOSIS A.  Cecal mass, endoscopic biopsy:  Tubulovillous adenoma with high grade dysplasia.  No evidence of invasive carcinoma.  B.  Rectal polyp, endoscopic mucosal resection:  Tubulovillous adenoma with high grade dysplasia.  No evidence of invasive carcinoma. Surgical margin is free of high grade dysplasia.   Clinical Information Rectal mass, cecal mass.  The colonoscopy impression:Polypoid mass in the cecum. Biopsied. - One benign appearing 20 mm polyp in the rectum piecemeal removed with mucosal resection. Resected and retrieved. - Mucosal resection was performed. Resection and retrieval were complete.          06/20/2017 Initial Diagnosis    Pathology results from right hemicolectomy  A.  Terminal ileum, appendix, right colon, resection: Invasive colonic adenocarcinoma, moderately differentiated Surgical margins are free of tumor 18 lymph nodes, negative for carcinoma (0/18)  Tumor site: cecum  Histologic type: Adenocarcinoma Histologic grade: Moderately differentiated, G2 Tumor size:  Greatest dimension in centimeters: 3.9 cm Tumor Extension: Tumor invades through the muscularis propria into pericolorectal rectal tissue Macroscopic tumor perforation not identified Lymphovascular invasion not identified Perineural invasion not identified All margins are uninvolved by invasive carcinoma, high-grade dysplasia, intramucosal adenocarcinoma, and adenoma   Primary tumor pT3 Regional lymph nodes pN0  MSI stable      06/20/2017 Procedure    Surgeries Performed: Procedure(s): LAPAROSCOPY, SURGICAL; RIGHT COLECTOMY, PARTIAL, WITH ANASTOMOSIS laparoscopic possible right hemicolectomy per Dr. Migaly       06/20/2017 Initial Diagnosis    Adenocarcinoma of cecum (HCC)     CURRENT THERAPY: Surveillance   INTERVAL HISTORY: Ms. Ringley returns for cancer surveillance follow-up as scheduled.  She denies changes in her health or new medical diagnoses since last visit.  She feels well, appetite is fair but normal for her.  Denies weight loss.  Mildly fatigued at times which is normal for her. Has 2 formed BMs daily, using fiber supplement, denies blood in stool.  No abdominal pain, nausea, vomiting, bloating, or distention.  Denies fever, chills, cough, dyspnea, chest pain.  She has right greater than left leg swelling since her stroke, swelling increased lately due to being off diuretic; PCP changed lisinopril-HCTZ to lisinopril after her diagnosis. Uses compression stocking and elevates legs. Has occasional right ankle/foot pain but no calf pain. She was told she cannot take a fluid pill because she has cancer.   REVIEW OF SYSTEMS:   Constitutional: Denies fevers, chills or abnormal weight loss (+) mild intermittent fatigue (+) fair appetite, normal for pt Eyes: Denies blurriness of vision Ears, nose, mouth, throat, and face: Denies mucositis or sore throat Respiratory: Denies cough, dyspnea or wheezes Cardiovascular: Denies palpitation, chest discomfort (+) lower extremity swelling,  R>L since stroke in 2016; increased lately while off diuretic Gastrointestinal:  Denies nausea, vomiting, constipation, diarrhea, hematochezia, bloating, distention, heartburn or change in bowel habits (+) 2 formed BM daily on fiber supplement Skin: Denies abnormal skin rashes Lymphatics: Denies new lymphadenopathy or easy bruising Neurological:Denies numbness, tingling or new weaknesses (+) right side nerve pain 2/2 to stroke  Behavioral/Psych: Mood is stable, no new changes  MSK: (+) right knee OA (+) R foot plantar fasciitis pain (+) ambulates with walker All other systems were reviewed with the patient and are negative.  MEDICAL HISTORY:  Past Medical History:  Diagnosis Date  . 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  . [name]   . Cataract   . Chronic kidney disease    kidney stone  . Colitis   . Colon cancer (HCC) 06/2017   cecum  . Diabetes mellitus without complication (HCC)    no meds taken  . Emphysema of lung (HCC)   . GERD (gastroesophageal reflux disease)   . Hyperlipidemia   . Hypertension   . Peripheral arterial disease (HCC)   . [name]   . Stroke (HCC)    November 2016- Thanksgiving night    SURGICAL HISTORY: Past Surgical History:  Procedure Laterality Date  . ABDOMINAL HYSTERECTOMY    . BALLOON DILATION N/A 12/23/2012   Procedure: BALLOON DILATION;  Surgeon: Robert D Kaplan, MD;  Location: [facility];  Service: Endoscopy;  Laterality: N/A;  . BALLOON DILATION N/A 01/15/2013   Procedure: BALLOON DILATION;  Surgeon: Robert D Kaplan, MD;  Location: [facility];  Service: Endoscopy;  Laterality: N/A;  . cataract     both eyes  . COLONOSCOPY    .   COLONOSCOPY WITH PROPOFOL N/A 03/05/2017   Procedure: COLONOSCOPY WITH PROPOFOL;  Surgeon: Nandigam, Kavitha V, MD;  Location: [facility];  Service: Endoscopy;  Laterality: N/A;  . DENTAL RESTORATION/EXTRACTION WITH X-RAY    . ESOPHAGOGASTRODUODENOSCOPY N/A 12/23/2012   Procedure:  ESOPHAGOGASTRODUODENOSCOPY (EGD);  Surgeon: Robert D Kaplan, MD;  Location: [facility];  Service: Endoscopy;  Laterality: N/A;  . ESOPHAGOGASTRODUODENOSCOPY N/A 01/15/2013   Procedure: ESOPHAGOGASTRODUODENOSCOPY (EGD);  Surgeon: Robert D Kaplan, MD;  Location: [facility];  Service: Endoscopy;  Laterality: N/A;  . TONSILLECTOMY      I have reviewed the social history and family history with the patient and they are unchanged from previous note.  ALLERGIES:  is allergic to lipitor [atorvastatin].  MEDICATIONS:  Current Outpatient Medications  Medication Sig Dispense Refill  . albuterol (PROVENTIL) (2.5 MG/3ML) 0.083% nebulizer solution Take 2.5 mg by nebulization every 6 (six) hours as needed for wheezing or shortness of breath.    . aspirin EC 81 MG tablet Take 1 tablet (81 mg total) by mouth daily. 90 tablet 3  . diphenhydrAMINE (BENADRYL) 25 mg capsule Take by mouth.    . doxylamine, Sleep, (SOBA NIGHTTIME SLEEP AID) 25 MG tablet Take 25 mg by mouth at bedtime as needed (for sleep.).    . Fluticasone-Umeclidin-Vilant (TRELEGY ELLIPTA) 100-62.5-25 MCG/INH AEPB Inhale 1 puff into the lungs daily. 1 each 11  . lisinopril (PRINIVIL,ZESTRIL) 20 MG tablet Take 1 tablet (20 mg total) by mouth daily. 90 tablet 3  . pravastatin (PRAVACHOL) 20 MG tablet TAKE 1 TABLET BY MOUTH ONCE DAILY 90 tablet 3   No current facility-administered medications for this visit.     PHYSICAL EXAMINATION: ECOG PERFORMANCE STATUS: 2 - Symptomatic, <50% confined to bed  Vitals:   11/20/17 0847  BP: (!) 167/65  Pulse: 78  Resp: 16  Temp: 98.2 F (36.8 C)  SpO2: 98%   Filed Weights   11/20/17 0847  Weight: 116 lb 12.8 oz (53 kg)    GENERAL:alert, no distress and comfortable SKIN: skin color, texture, turgor are normal, no rashes or significant lesions EYES: normal, Conjunctiva are pink and non-injected, sclera clear OROPHARYNX:no exudate, no erythema and lips, buccal mucosa, and tongue normal  LYMPH:   no palpable cervical, supraclavicular, axillary, or inguinal lymphadenopathy LUNGS: clear to auscultation bilaterally with normal breathing effort HEART: regular rate & rhythm and no murmurs (+) trace R>L lower extremity edema, no calf tenderness  ABDOMEN:abdomen soft, non-tender and normal bowel sounds. Abdominal incisions are well healed  Musculoskeletal:no cyanosis of digits and no clubbing  NEURO: alert & oriented x 3 with fluent speech  LABORATORY DATA:  I have reviewed the data as listed CBC Latest Ref Rng & Units 11/20/2017 07/30/2017 05/23/2017  WBC 3.9 - 10.3 K/uL 9.5 7.5 10.6(H)  Hemoglobin 11.6 - 15.9 g/dL 13.5 11.4(L) 13.2  Hematocrit 34.8 - 46.6 % 41.7 36.1 41.0  Platelets 145 - 400 K/uL 233 252 301.0     CMP Latest Ref Rng & Units 11/20/2017 07/30/2017 05/23/2017  Glucose 70 - 140 mg/dL 106 89 94  BUN 7 - 26 mg/dL 14 13.1 28(H)  Creatinine 0.60 - 1.10 mg/dL 0.86 0.8 1.08  Sodium 136 - 145 mmol/L 143 142 139  Potassium 3.5 - 5.1 mmol/L 3.3(L) 3.6 4.5  Chloride 98 - 109 mmol/L 108 - 106  CO2 22 - 29 mmol/L 26 24 27  Calcium 8.4 - 10.4 mg/dL 10.2 9.2 9.9  Total Protein 6.4 - 8.3 g/dL 7.1 6.5 7.2  Total Bilirubin   0.2 - 1.2 mg/dL 0.4 0.31 0.4  Alkaline Phos 40 - 150 U/L 86 55 55  AST 5 - 34 U/L 14 14 18  ALT 0 - 55 U/L 11 10 13      RADIOGRAPHIC STUDIES: I have personally reviewed the radiological images as listed and agreed with the findings in the report. No results found.   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 Referred from [facility] for management of adenocarcinoma of the cecum  1. Invasive colonic adenocarcinoma of the cecum, moderately differentiated G2; pT3, pN0, cM0, MSI-stable; stage IIb -Ms. Ceasar appears stable. S/P complete surgical resection in 20/2018; she has  healed and is clinically doing well. VS and weight stable. Labs reviewed, CBC and CMP unremarkable, CEA normal; no clinical concern for recurrence. -Continue colon cancer surveillance, will obtain surveillance CT in 4 months, 03/2018, return for lab and f/u with Dr. Feng few days after scan; colonoscopy will be due 06/2018.   2. Lower extremity edema  -She has chronic lower extremity edema, increased lately since off diuretic. From oncology standpoint, no contraindication to diuretic at this time.   3. Hypokalemia -She has mild hypokalemia, K 3.3; I encouraged her to increase potassium in her diet. I explained if she restarts diuretic, may need closely attention to K level as diuretics can alter electrolytes.   PLAN: -She is clinically doing well, continue colon cancer surveillance -Annual surveillance CT 03/2018, ordered today -Lab, f/u with Dr. Feng in 4 months, few days after scan -Colonoscopy due 06/2018   Orders Placed This Encounter  Procedures  . CEA (IN HOUSE-[facility])   All questions were answered. The patient knows to call the clinic with any problems, questions or concerns. No barriers to learning was detected.      K , NP 11/20/17    

## 2017-11-28 ENCOUNTER — Encounter: Payer: Self-pay | Admitting: Internal Medicine

## 2017-11-28 ENCOUNTER — Ambulatory Visit (INDEPENDENT_AMBULATORY_CARE_PROVIDER_SITE_OTHER): Payer: Medicare HMO | Admitting: Internal Medicine

## 2017-11-28 VITALS — BP 130/60 | HR 89 | Temp 97.8°F | Ht 60.0 in | Wt 114.0 lb

## 2017-11-28 DIAGNOSIS — I872 Venous insufficiency (chronic) (peripheral): Secondary | ICD-10-CM

## 2017-11-28 DIAGNOSIS — N3944 Nocturnal enuresis: Secondary | ICD-10-CM | POA: Diagnosis not present

## 2017-11-28 DIAGNOSIS — M79601 Pain in right arm: Secondary | ICD-10-CM | POA: Diagnosis not present

## 2017-11-28 MED ORDER — FUROSEMIDE 20 MG PO TABS: 20.0000 mg | ORAL_TABLET | Freq: Every day | ORAL | 0 refills | Status: DC | PRN

## 2017-11-28 MED ORDER — GABAPENTIN 300 MG PO CAPS: 300.0000 mg | ORAL_CAPSULE | Freq: Three times a day (TID) | ORAL | 0 refills | Status: DC

## 2017-11-28 NOTE — Progress Notes (Signed)
   Subjective:    Patient ID: Sherry Lambert, female    DOB: March 17, 1941, 77 y.o.   MRN: [id number]  HPI The patient is a 77 YO female coming in for several concerns including edema (in right leg more than left, since her stroke she gets the sensation of swelling and pressure, tried lasix and it helped sometimes, has some compression stockings but they are wearing out and needs more) and skin tear right arm (recent fall outside, waited about 1 hour for someone to pass by as she could not get up and had no form of communication on her, denies balance problems, she tripped over garden hose), and urinary frequency and incontinence (having problems getting to the bathroom in the night and morning, drinks liquids close to bedtime, denies pain with urination or abdominal pain).   Review of Systems  Constitutional: Negative.   HENT: Negative.   Eyes: Negative.   Respiratory: Negative for cough, chest tightness and shortness of breath.   Cardiovascular: Positive for leg swelling. Negative for chest pain and palpitations.  Gastrointestinal: Negative for abdominal distention, abdominal pain, constipation, diarrhea, nausea and vomiting.  Genitourinary: Positive for enuresis, frequency and urgency.  Musculoskeletal: Positive for myalgias.  Skin: Positive for wound.  Neurological: Negative.   Psychiatric/Behavioral: Negative.       Objective:   Physical Exam  Constitutional: She is oriented to person, place, and time. She appears well-developed and well-nourished.  HENT:  Head: Normocephalic and atraumatic.  Eyes: EOM are normal.  Neck: Normal range of motion.  Cardiovascular: Normal rate and regular rhythm.  Pulmonary/Chest: Effort normal and breath sounds normal. No respiratory distress. She has no wheezes. She has no rales.  Abdominal: Soft. Bowel sounds are normal. She exhibits no distension. There is no tenderness. There is no rebound.  Musculoskeletal: She exhibits no edema.  Trace edema in  the ankles, not prominent.   Neurological: She is alert and oriented to person, place, and time. A cranial nerve deficit is present. Coordination normal.  Stable right sensory changes.   Skin: Skin is warm and dry.  Skin tear right biceps region 2 by 2 cm triangular region. No redness or infection  Psychiatric: She has a normal mood and affect.   Vitals:   11/28/17 1000  BP: 130/60  Pulse: 89  Temp: 97.8 F (36.6 C)  TempSrc: Oral  SpO2: 98%  Weight: 114 lb (51.7 kg)  Height: 5' (1.524 m)      Assessment & Plan:

## 2017-11-28 NOTE — Patient Instructions (Addendum)
Keep the wound covered. Make sure to keep some form of communication with you at all times so you can always get help if needed.   We have given you the compression stockings prescription.   Try the kegel exercises for the bladder and limit fluids after 7PM.  We have sent in the lasix which is a fluid pill to take only if needed for swelling.    Kegel Exercises Kegel exercises help strengthen the muscles that support the rectum, vagina, small intestine, bladder, and uterus. Doing Kegel exercises can help:  Improve bladder and bowel control.  Improve sexual response.  Reduce problems and discomfort during pregnancy.  Kegel exercises involve squeezing your pelvic floor muscles, which are the same muscles you squeeze when you try to stop the flow of urine. The exercises can be done while sitting, standing, or lying down, but it is best to vary your position. Phase 1 exercises 1. Squeeze your pelvic floor muscles tight. You should feel a tight lift in your rectal area. If you are a female, you should also feel a tightness in your vaginal area. Keep your stomach, buttocks, and legs relaxed. 2. Hold the muscles tight for up to 10 seconds. 3. Relax your muscles. Repeat this exercise 50 times a day or as many times as told by your health care provider. Continue to do this exercise for at least 4-6 weeks or for as long as told by your health care provider. This information is not intended to replace advice given to you by your health care provider. Make sure you discuss any questions you have with your health care provider. Document Released: 08/06/2012 Document Revised: 04/14/2016 Document Reviewed: 07/10/2015 Elsevier Interactive Patient Education  [employer].

## 2017-11-29 DIAGNOSIS — I872 Venous insufficiency (chronic) (peripheral): Secondary | ICD-10-CM | POA: Insufficient documentation

## 2017-11-29 DIAGNOSIS — M79601 Pain in right arm: Secondary | ICD-10-CM | POA: Insufficient documentation

## 2017-11-29 DIAGNOSIS — R32 Unspecified urinary incontinence: Secondary | ICD-10-CM | POA: Insufficient documentation

## 2017-11-29 NOTE — Assessment & Plan Note (Signed)
Suspect from nocturnal water intake and asked her to limit this. Also given instruction on kegel exercises and will try this. Denies symptoms of infection and if this does not work will check U/A.

## 2017-11-29 NOTE — Assessment & Plan Note (Addendum)
Skin tear biceps region about 2 by 2 cm triangular shape. No signs of infection. Wrapped in the office and advised triple antibiotic ointment under non-stick dressing changed every 1-2 days. The underlying issue is not having help to get up and no form of communication.She was lucky that someone came along only 1 hour later and weather was mild. She has a cell phone and is asked to keep it with her at all times. If she does not agree with that would recommend lifeline or similar to activate system if fall again.

## 2017-11-29 NOTE — Assessment & Plan Note (Signed)
Rx for compression stockings and rx for lasix prn only. Do not want her taking daily. Compression stockings are much better. No significant swelling on exam and some of the problem is likely sensation change from stroke.

## 2017-12-20 ENCOUNTER — Telehealth: Payer: Self-pay | Admitting: Internal Medicine

## 2017-12-20 NOTE — Telephone Encounter (Signed)
Copied from [place] [id number]. Topic: Quick Communication - Rx Refill/Question >> Dec 20, 2017 12:23 PM Margot Ables wrote: Medication: gabapentin is not covered by pts insurance - they mailed her a letter stating there may be other alternative medications. No medications listed. 10 days of medication left.  Please advise. Has the patient contacted their pharmacy? No - letter received Preferred Pharm[facility] (with phone number or street name): [facility] [address] [place] [phone] (Phone) [phone] (Fax)

## 2017-12-23 ENCOUNTER — Telehealth: Payer: Self-pay | Admitting: Internal Medicine

## 2017-12-23 NOTE — Telephone Encounter (Signed)
Called patient to find out if she knew the name of the medication and if she did not to call her insurance to find out what the alternative medication is. Patient states will call us back with the medication name. Also states that she had stopped her gabapentin about a month ago and her family had given her a hard time about it so she started taking it again about two weeks ago. States she is able to walk better while on it and is not sure if she should switch medications since they stated the new medication is not as good as the gabapentin. I stated that I can try a PA now for her and patient stated she will just call back with the name of the alternative medication.

## 2017-12-23 NOTE — Telephone Encounter (Signed)
Patient informed of MD response and stated understanding  

## 2017-12-23 NOTE — Telephone Encounter (Signed)
Copied from [place] [id number]. Topic: Quick Communication - See Telephone Encounter >> Dec 23, 2017 12:31 PM Boyd Kerbs wrote: CRM for notification.   Pt. Blood pressure went down to 108 and she quite taking BP medication last week and has been fine. Has been around 114 to 127 . Please advice.   See Telephone encounter for: 12/23/17.

## 2017-12-23 NOTE — Telephone Encounter (Signed)
I'm not sure what the question is. Her gabapentin is TID maximum so should not have a problem with insurance coverage. This is to be taken prn so 3 is the max per day she does not have to take 3 per day if she needs less.

## 2017-12-23 NOTE — Telephone Encounter (Signed)
Pt. Called insurance and was told they do cover Gabapentin as long as if not over 3 pills a day.   She felt like 3 a day was too strong.  Please advise

## 2018-01-10 ENCOUNTER — Other Ambulatory Visit: Payer: Self-pay | Admitting: Internal Medicine

## 2018-01-31 ENCOUNTER — Encounter (HOSPITAL_COMMUNITY): Payer: Self-pay | Admitting: Nurse Practitioner

## 2018-01-31 ENCOUNTER — Emergency Department (HOSPITAL_COMMUNITY)
Admission: EM | Admit: 2018-01-31 | Discharge: 2018-02-01 | Disposition: A | Discharge status: Home / Self Care | Payer: Medicare HMO | Attending: Emergency Medicine | Admitting: Emergency Medicine

## 2018-01-31 DIAGNOSIS — R197 Diarrhea, unspecified: Secondary | ICD-10-CM | POA: Diagnosis not present

## 2018-01-31 DIAGNOSIS — I10 Essential (primary) hypertension: Secondary | ICD-10-CM | POA: Insufficient documentation

## 2018-01-31 DIAGNOSIS — R52 Pain, unspecified: Secondary | ICD-10-CM | POA: Diagnosis not present

## 2018-01-31 DIAGNOSIS — R109 Unspecified abdominal pain: Secondary | ICD-10-CM | POA: Diagnosis not present

## 2018-01-31 DIAGNOSIS — R339 Retention of urine, unspecified: Secondary | ICD-10-CM | POA: Insufficient documentation

## 2018-01-31 DIAGNOSIS — Z87891 Personal history of nicotine dependence: Secondary | ICD-10-CM | POA: Insufficient documentation

## 2018-01-31 DIAGNOSIS — R1030 Lower abdominal pain, unspecified: Secondary | ICD-10-CM | POA: Diagnosis present

## 2018-01-31 DIAGNOSIS — Z79899 Other long term (current) drug therapy: Secondary | ICD-10-CM | POA: Insufficient documentation

## 2018-01-31 DIAGNOSIS — R1084 Generalized abdominal pain: Secondary | ICD-10-CM | POA: Diagnosis not present

## 2018-01-31 DIAGNOSIS — E119 Type 2 diabetes mellitus without complications: Secondary | ICD-10-CM | POA: Insufficient documentation

## 2018-01-31 NOTE — ED Triage Notes (Signed)
Pt has been presented from home, EMS report indicates that pt called with c/o lower quadrants abdominal pain radiating to right flank and the back. Pt is reportes pain onset of about a week, she also notes that she might be constipated last BM was 5 days ago.

## 2018-01-31 NOTE — ED Notes (Signed)
Bed: WA03 Expected date:  Expected time:  Means of arrival:  Comments: EMS 77 yo female from home abdominal pain to back x 6 days-No BM x 5 days BP 160/60 HR 100 RR16 O2 sat 93%

## 2018-02-01 ENCOUNTER — Emergency Department (HOSPITAL_COMMUNITY): Payer: Medicare HMO

## 2018-02-01 DIAGNOSIS — R109 Unspecified abdominal pain: Secondary | ICD-10-CM | POA: Diagnosis not present

## 2018-02-01 DIAGNOSIS — Z79899 Other long term (current) drug therapy: Secondary | ICD-10-CM | POA: Diagnosis not present

## 2018-02-01 DIAGNOSIS — I10 Essential (primary) hypertension: Secondary | ICD-10-CM | POA: Diagnosis not present

## 2018-02-01 DIAGNOSIS — Z87891 Personal history of nicotine dependence: Secondary | ICD-10-CM | POA: Diagnosis not present

## 2018-02-01 DIAGNOSIS — R339 Retention of urine, unspecified: Secondary | ICD-10-CM | POA: Diagnosis not present

## 2018-02-01 DIAGNOSIS — E119 Type 2 diabetes mellitus without complications: Secondary | ICD-10-CM | POA: Diagnosis not present

## 2018-02-01 LAB — COMPREHENSIVE METABOLIC PANEL
ALBUMIN: 3.5 g/dL (ref 3.5–5.0)
ALK PHOS: 75 U/L (ref 38–126)
ALT: 14 U/L (ref 14–54)
AST: 17 U/L (ref 15–41)
Anion gap: 9 (ref 5–15)
BILIRUBIN TOTAL: 0.6 mg/dL (ref 0.3–1.2)
BUN: 17 mg/dL (ref 6–20)
CALCIUM: 9.3 mg/dL (ref 8.9–10.3)
CO2: 24 mmol/L (ref 22–32)
Chloride: 108 mmol/L (ref 101–111)
Creatinine, Ser: 0.92 mg/dL (ref 0.44–1.00)
GFR calc non Af Amer: 59 mL/min — ABNORMAL LOW (ref >60)
GLUCOSE: 112 mg/dL — AB (ref 65–99)
Potassium: 3.5 mmol/L (ref 3.5–5.1)
Sodium: 141 mmol/L (ref 135–145)
TOTAL PROTEIN: 6.6 g/dL (ref 6.5–8.1)

## 2018-02-01 LAB — URINALYSIS, ROUTINE W REFLEX MICROSCOPIC
Bacteria, UA: NONE SEEN
Bilirubin Urine: NEGATIVE
GLUCOSE, UA: NEGATIVE mg/dL
Ketones, ur: NEGATIVE mg/dL
Leukocytes, UA: NEGATIVE
NITRITE: NEGATIVE
PH: 6 (ref 5.0–8.0)
Protein, ur: NEGATIVE mg/dL
SPECIFIC GRAVITY, URINE: 1.012 (ref 1.005–1.030)

## 2018-02-01 LAB — CBC
HCT: 39.2 % (ref 36.0–46.0)
HEMOGLOBIN: 12.4 g/dL (ref 12.0–15.0)
MCH: 28.8 pg (ref 26.0–34.0)
MCHC: 31.6 g/dL (ref 30.0–36.0)
MCV: 91.2 fL (ref 78.0–100.0)
PLATELETS: 242 10*3/uL (ref 150–400)
RBC: 4.3 MIL/uL (ref 3.87–5.11)
RDW: 14.7 % (ref 11.5–15.5)
WBC: 10.3 10*3/uL (ref 4.0–10.5)

## 2018-02-01 LAB — LIPASE, BLOOD: Lipase: 28 U/L (ref 11–51)

## 2018-02-01 MED ORDER — IOPAMIDOL (ISOVUE-300) INJECTION 61%
100.0000 mL | Freq: Once | INTRAVENOUS | Status: AC | PRN
  Administered 2018-02-01: 100 mL via INTRAVENOUS

## 2018-02-01 MED ORDER — IOPAMIDOL (ISOVUE-300) INJECTION 61%
INTRAVENOUS | Status: AC
  Filled 2018-02-01: qty 100

## 2018-02-01 MED ORDER — TAMSULOSIN HCL 0.4 MG PO CAPS: 0.4000 mg | ORAL_CAPSULE | Freq: Every day | ORAL | 0 refills | Status: DC

## 2018-02-01 MED ORDER — FENTANYL CITRATE (PF) 100 MCG/2ML IJ SOLN
50.0000 ug | Freq: Once | INTRAMUSCULAR | Status: AC
  Administered 2018-02-01: 50 ug via INTRAVENOUS
  Filled 2018-02-01: qty 2

## 2018-02-01 MED ORDER — TAMSULOSIN HCL 0.4 MG PO CAPS
0.4000 mg | ORAL_CAPSULE | Freq: Every day | ORAL | Status: DC
  Administered 2018-02-01: 0.4 mg via ORAL
  Filled 2018-02-01: qty 1

## 2018-02-01 MED ORDER — CEPHALEXIN 500 MG PO CAPS: 500.0000 mg | ORAL_CAPSULE | Freq: Two times a day (BID) | ORAL | 0 refills | Status: DC

## 2018-02-01 MED ORDER — KETOROLAC TROMETHAMINE 30 MG/ML IJ SOLN
15.0000 mg | Freq: Once | INTRAMUSCULAR | Status: AC
  Administered 2018-02-01: 15 mg via INTRAVENOUS
  Filled 2018-02-01: qty 1

## 2018-02-01 NOTE — ED Notes (Signed)
Bed: WA20 Expected date:  Expected time:  Means of arrival:  Comments: Room 3

## 2018-02-01 NOTE — ED Provider Notes (Signed)
[facility] DEPT Provider Note   CSN: [id number] Arrival date & time: 01/31/18  2325     History   Chief Complaint Chief Complaint  Patient presents with  . Abdominal Pain    HPI Sherry Lambert is a 77 y.o. female.  The history is provided by the patient.  Abdominal Pain   This is a new problem. The current episode started more than 2 days ago. The problem has not changed since onset.The pain is associated with an unknown factor. The pain is located in the suprapubic region. The quality of the pain is dull. The pain is severe. Pertinent negatives include anorexia, fever, belching, diarrhea, flatus, hematochezia, melena, nausea, vomiting, constipation, dysuria, frequency, hematuria, headaches, arthralgias and myalgias. Nothing aggravates the symptoms. Nothing relieves the symptoms. Past workup does not include GI consult.    Past Medical History:  Diagnosis Date  . 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 [name]   . Cataract   . Chronic kidney disease    kidney stone  . Colitis   . Colon cancer ([place]) 06/2017   cecum  . Diabetes mellitus without complication ([place])    no meds taken  . Emphysema of lung ([place])   . GERD (gastroesophageal reflux disease)   . Hyperlipidemia   . Hypertension   . Peripheral arterial disease ([place])   . [name]   . Stroke [facility])    November 2016- Thanksgiving night    Patient Active Problem List   Diagnosis Date Noted  . Venous insufficiency 11/29/2017  . Urinary incontinence 11/29/2017  . Right arm pain 11/29/2017  . 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 Preoperative clearance 06/03/2017  . Cecum mass   . Mass in rectum   . PVD (peripheral vascular disease) ([place]) 09/14/2016  . COPD (chronic obstructive pulmonary disease) ([place]) 12/01/2015  . 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 Essential hypertension 12/20/2014  .  Intracerebral hemorrhage ([place]) 10/26/2014  . Routine general medical examination at a health care facility 10/22/2014  . 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 Peripheral arterial disease ([place]) 01/05/2014  . Osteopenia 11/12/2013  . Dyslipidemia 09/19/2013  . Diabetes mellitus type 2, controlled, 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[place]) 02/27/9484  . Other emphysema ([place]) 11/10/2012    Past Surgical History:  Procedure Laterality Date  . ABDOMINAL HYSTERECTOMY    . BALLOON DILATION N/A 12/23/2012   Procedure: BALLOON DILATION;  Surgeon: Inda Castle, MD;  Location: [facility];  Service: Endoscopy;  Laterality: N/A;  . BALLOON DILATION N/A 01/15/2013   Procedure: BALLOON DILATION;  Surgeon: Inda Castle, MD;  Location: [facility];  Service: Endoscopy;  Laterality: N/A;  . cataract     both eyes  . COLONOSCOPY    . COLONOSCOPY WITH PROPOFOL N/A 03/05/2017   Procedure: COLONOSCOPY WITH PROPOFOL;  Surgeon: Mauri Pole, MD;  Location: [facility];  Service: Endoscopy;  Laterality: N/A;  . DENTAL RESTORATION/EXTRACTION WITH X-RAY    . ESOPHAGOGASTRODUODENOSCOPY N/A 12/23/2012   Procedure: ESOPHAGOGASTRODUODENOSCOPY (EGD);  Surgeon: Inda Castle, MD;  Location: [facility];  Service: Endoscopy;  Laterality: N/A;  . ESOPHAGOGASTRODUODENOSCOPY N/A 01/15/2013   Procedure: ESOPHAGOGASTRODUODENOSCOPY (EGD);  Surgeon: Inda Castle, MD;  Location: [facility];  Service: Endoscopy;  Laterality: N/A;  . TONSILLECTOMY       OB History   None      Home Medications    Prior  to Admission medications   Medication Sig Start Date End Date Taking? Authorizing Provider  albuterol (PROVENTIL) (2.5 MG/3ML) 0.083% nebulizer solution Take 2.5 mg by nebulization every 6 (six) hours as needed for wheezing or shortness of breath.   Yes [provider]  aspirin EC 81 MG tablet Take 1 tablet (81 mg total) by mouth daily. 12/20/14  Yes Rosalin Hawking, MD    diphenhydrAMINE (BENADRYL) 25 mg capsule Take by mouth.   Yes [provider]  doxylamine, Sleep, (SOBA NIGHTTIME SLEEP AID) 25 MG tablet Take 25 mg by mouth at bedtime as needed (for sleep.).   Yes [provider]  Fluticasone-Umeclidin-Vilant (TRELEGY ELLIPTA) 100-62.5-25 MCG/INH AEPB Inhale 1 puff into the lungs daily. 05/23/17  Yes [name], MD  gabapentin (NEURONTIN) 300 MG capsule TAKE 1 CAPSULE BY MOUTH THREE TIMES DAILY 01/10/18  Yes [name], MD  pravastatin (PRAVACHOL) 20 MG tablet TAKE 1 TABLET BY MOUTH ONCE DAILY 10/23/17  Yes [name], MD  furosemide (LASIX) 20 MG tablet Take 1 tablet (20 mg total) by mouth daily as needed. Patient not taking: Reported on 02/01/2018 11/28/17   [name], MD  lisinopril (PRINIVIL,ZESTRIL) 20 MG tablet Take 1 tablet (20 mg total) by mouth daily. Patient not taking: Reported on 02/01/2018 09/20/17   [name], MD    Family History Family History  Problem Relation Age of Onset  . Hypertension Father   . Diabetes Father   . Cancer Father        melanoma  . Colon cancer Sister 39  . Cancer Sister        colon, surgery alone   . Early death Neg Hx   . Stroke Neg Hx   . Esophageal cancer Neg Hx   . Rectal cancer Neg Hx   . Stomach cancer Neg Hx     Social History Social History   Tobacco Use  . Smoking status: Former Smoker    Packs/day: 1.00    Years: 55.00    Pack years: 55.00    Types: Cigarettes    Last attempt to quit: 10/19/2002    Years since quitting: 15.2  . Smokeless tobacco: Never Used  Substance Use Topics  . Alcohol use: No    Alcohol/week: 0.0 oz  . Drug use: No     Allergies   Lipitor [atorvastatin]   Review of Systems Review of Systems  Constitutional: Negative for fever.  Respiratory: Negative for shortness of breath.   Gastrointestinal: Positive for abdominal pain. Negative for anorexia, constipation, diarrhea, flatus, hematochezia,  melena, nausea and vomiting.  Genitourinary: Negative for dysuria, frequency and hematuria.  Musculoskeletal: Negative for arthralgias and myalgias.  Neurological: Negative for headaches.  All other systems reviewed and are negative.    Physical Exam Updated Vital Signs BP (!) 132/58 (BP Location: Left Arm)   Pulse 78   Resp 17   SpO2 96%   Physical Exam  Constitutional: She is oriented to person, place, and time. She appears well-developed and well-nourished. No distress.  HENT:  Head: Normocephalic and atraumatic.  Mouth/Throat: No oropharyngeal exudate.  Eyes: Pupils are equal, round, and reactive to light. Conjunctivae are normal.  Neck: Normal range of motion. Neck supple.  Cardiovascular: Normal rate, regular rhythm, normal heart sounds and intact distal pulses.  Pulmonary/Chest: Effort normal and breath sounds normal. No stridor. She has no wheezes. She has no rales.  Abdominal: Soft. She exhibits distension. She exhibits no mass. There is tenderness.  There is no rebound and no guarding. No hernia.  Suprapubic   Musculoskeletal: Normal range of motion.  Neurological: She is alert and oriented to person, place, and time. She displays normal reflexes.  Skin: Skin is warm and dry. Capillary refill takes less than 2 seconds.  Psychiatric: She has a normal mood and affect.     ED Treatments / Results  Labs (all labs ordered are listed, but only abnormal results are displayed) Labs Reviewed  COMPREHENSIVE METABOLIC PANEL - Abnormal; Notable for the following components:      Result Value   Glucose, Bld 112 (*)    GFR calc non Af Amer 59 (*)    All other components within normal limits  URINALYSIS, ROUTINE W REFLEX MICROSCOPIC - Abnormal; Notable for the following components:   Color, Urine STRAW (*)    Hgb urine dipstick SMALL (*)    All other components within normal limits  LIPASE, BLOOD  CBC    EKG None  Radiology Ct Abdomen Pelvis W Contrast  Result Date:  02/01/2018 CLINICAL DATA:  77 y/o F; lower quadrant abdominal pain radiating to the right flank and back. Last bowel movement 5 days ago. EXAM: CT ABDOMEN AND PELVIS WITH CONTRAST TECHNIQUE: Multidetector CT imaging of the abdomen and pelvis was performed using the standard protocol following bolus administration of intravenous contrast. CONTRAST:  140mL ISOVUE-300 IOPAMIDOL (ISOVUE-300) INJECTION 61% COMPARISON:  12/05/2016 CT abdomen and pelvis. FINDINGS: Lower chest: No acute abnormality.  Moderate hiatal hernia. Hepatobiliary: No focal liver abnormality is seen. No gallstones, gallbladder wall thickening, or biliary dilatation. Pancreas: Unremarkable. No pancreatic ductal dilatation or surrounding inflammatory changes. Spleen: Normal in size without focal abnormality. Adrenals/Urinary Tract: Normal adrenal glands. Left kidney upper pole cortical scar. Multiple stable subcentimeter cortical cysts in the kidneys bilaterally. No hydronephrosis or urinary stone disease. Normal bladder. Stomach/Bowel: Stomach is within normal limits. Partial right colectomy, patent anastomosis. No evidence of bowel wall thickening, distention, or inflammatory changes. Scattered sigmoid diverticulosis, no findings of acute diverticulitis. Vascular/Lymphatic: Aortic atherosclerosis. No enlarged abdominal or pelvic lymph nodes. Reproductive: Status post hysterectomy. No adnexal masses. Other: No abdominal wall hernia or abnormality. No abdominopelvic ascites. Musculoskeletal: T12 inferior endplate fracture with mild loss of vertebral body height. IMPRESSION: 1. New T12 inferior endplate fracture with mild loss of vertebral body height, age indeterminate. 2. Aortic atherosclerosis. 3. Stable moderate hiatal hernia. 4. Scattered sigmoid diverticulosis, no findings of acute diverticulitis. Electronically Signed   By: Kristine Garbe M.D.   On: 02/01/2018 04:57    Procedures Procedures (including critical care  time)  Medications Ordered in ED Medications  iopamidol (ISOVUE-300) 61 % injection (has no administration in time range)  tamsulosin (FLOMAX) capsule 0.4 mg (has no administration in time range)  ketorolac (TORADOL) 30 MG/ML injection 15 mg (has no administration in time range)  fentaNYL (SUBLIMAZE) injection 50 mcg (50 mcg Intravenous Given 02/01/18 0443)  iopamidol (ISOVUE-300) 61 % injection 100 mL (100 mLs Intravenous Contrast Given 02/01/18 0420)     Given pain was low and there was distention and patient had not urinated even with pure wick in and out cath for urine to r/o UTI.  Informed later there was over 500 cc in bladder.  This is almost certainly the source of the pain and distention and have informed patient and family foley would need to be placed.  They agree to this plan and follow up in the office for voiding trial.    Final Clinical Impressions(s) / ED  Diagnoses    Return for weakness, numbness, changes in vision or speech, fevers >100.4 unrelieved by medication, shortness of breath, intractable vomiting, or diarrhea, abdominal pain, Inability to tolerate liquids or food, cough, altered mental status or any concerns. No signs of systemic illness or infection. The patient is nontoxic-appearing on exam and vital signs are within normal limits.   I have reviewed the triage vital signs and the nursing notes. Pertinent labs &imaging results that were available during my care of the patient were reviewed by me and considered in my medical decision making (see chart for details).  After history, exam, and medical workup I feel the patient has been appropriately medically screened and is safe for discharge home. Pertinent diagnoses were discussed with the patient. Patient was given return precautions.      Jennell Janosik, MD 02/01/18 [phone]

## 2018-02-05 ENCOUNTER — Ambulatory Visit: Payer: Medicare HMO | Admitting: Physician Assistant

## 2018-02-11 DIAGNOSIS — R338 Other retention of urine: Secondary | ICD-10-CM | POA: Diagnosis not present

## 2018-02-19 ENCOUNTER — Telehealth: Payer: Self-pay | Admitting: *Deleted

## 2018-02-19 NOTE — Telephone Encounter (Signed)
Faxed ROI to [employer]; release [phone]

## 2018-02-25 DIAGNOSIS — R8279 Other abnormal findings on microbiological examination of urine: Secondary | ICD-10-CM | POA: Diagnosis not present

## 2018-02-25 DIAGNOSIS — R338 Other retention of urine: Secondary | ICD-10-CM | POA: Diagnosis not present

## 2018-03-11 ENCOUNTER — Other Ambulatory Visit: Payer: Self-pay | Admitting: Internal Medicine

## 2018-03-18 ENCOUNTER — Other Ambulatory Visit (INDEPENDENT_AMBULATORY_CARE_PROVIDER_SITE_OTHER): Payer: Medicare HMO

## 2018-03-18 ENCOUNTER — Encounter: Payer: Self-pay | Admitting: Family

## 2018-03-18 ENCOUNTER — Ambulatory Visit (INDEPENDENT_AMBULATORY_CARE_PROVIDER_SITE_OTHER): Payer: Medicare HMO | Admitting: Family

## 2018-03-18 ENCOUNTER — Ambulatory Visit (INDEPENDENT_AMBULATORY_CARE_PROVIDER_SITE_OTHER)
Admission: RE | Admit: 2018-03-18 | Discharge: 2018-03-18 | Disposition: A | Discharge status: Home / Self Care | Payer: Medicare HMO | Source: Ambulatory Visit | Attending: Family | Admitting: Family

## 2018-03-18 VITALS — BP 138/70 | HR 85 | Temp 98.1°F | Ht 61.0 in | Wt 120.4 lb

## 2018-03-18 DIAGNOSIS — R350 Frequency of micturition: Secondary | ICD-10-CM

## 2018-03-18 DIAGNOSIS — I999 Unspecified disorder of circulatory system: Secondary | ICD-10-CM

## 2018-03-18 DIAGNOSIS — M545 Low back pain, unspecified: Secondary | ICD-10-CM

## 2018-03-18 DIAGNOSIS — M4726 Other spondylosis with radiculopathy, lumbar region: Secondary | ICD-10-CM | POA: Diagnosis not present

## 2018-03-18 LAB — URINALYSIS
Bilirubin Urine: NEGATIVE
KETONES UR: NEGATIVE
NITRITE: NEGATIVE
PH: 6 (ref 5.0–8.0)
Total Protein, Urine: NEGATIVE
URINE GLUCOSE: NEGATIVE
Urobilinogen, UA: 0.2 (ref 0.0–1.0)

## 2018-03-18 LAB — URINALYSIS, ROUTINE W REFLEX MICROSCOPIC
Bilirubin Urine: NEGATIVE
Ketones, ur: NEGATIVE
Nitrite: NEGATIVE
Specific Gravity, Urine: <=1.005 — AB (ref 1.000–1.030)
Total Protein, Urine: NEGATIVE
Urine Glucose: NEGATIVE
Urobilinogen, UA: 0.2 (ref 0.0–1.0)
pH: 6 (ref 5.0–8.0)

## 2018-03-18 MED ORDER — TRAMADOL HCL 50 MG PO TABS: ORAL_TABLET | ORAL | 0 refills | Status: DC

## 2018-03-18 NOTE — Progress Notes (Signed)
Sherry Lambert is a 77 y.o. female with the following history as recorded in EpicCare:  Patient Active Problem List   Diagnosis Date Noted  . Venous insufficiency 11/29/2017  . Urinary incontinence 11/29/2017  . Right arm pain 11/29/2017  . 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 Preoperative clearance 06/03/2017  . Cecum mass   . Mass in rectum   . PVD (peripheral vascular disease) ([place]) 09/14/2016  . COPD (chronic obstructive pulmonary disease) ([place]) 12/01/2015  . 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 Essential hypertension 12/20/2014  . Intracerebral hemorrhage ([place]) 10/26/2014  . Routine general medical examination at a health care facility 10/22/2014  . 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 Peripheral arterial disease ([place]) 01/05/2014  . Osteopenia 11/12/2013  . Dyslipidemia 09/19/2013  . Diabetes mellitus type 2, controlled, 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[place]) 74/94/4967  . Other emphysema ([place]) 11/10/2012    Current Outpatient Medications  Medication Sig Dispense Refill  . albuterol (PROVENTIL) (2.5 MG/3ML) 0.083% nebulizer solution Take 2.5 mg by nebulization every 6 (six) hours as needed for wheezing or shortness of breath.    Marland Kitchen aspirin EC 81 MG tablet Take 1 tablet (81 mg total) by mouth daily. 90 tablet 3  . diphenhydrAMINE (BENADRYL) 25 mg capsule Take by mouth.    . doxylamine, Sleep, (SOBA NIGHTTIME SLEEP AID) 25 MG tablet Take 25 mg by mouth at bedtime as needed (for sleep.).    Marland Kitchen Fluticasone-Umeclidin-Vilant (TRELEGY ELLIPTA) 100-62.5-25 MCG/INH AEPB Inhale 1 puff into the lungs daily. 1 each 11  . furosemide (LASIX) 20 MG tablet Take 1 tablet (20 mg total) by mouth daily as needed. 30 tablet 0  . gabapentin (NEURONTIN) 300 MG capsule TAKE 1 CAPSULE BY MOUTH THREE TIMES DAILY 90 capsule 3  . lisinopril (PRINIVIL,ZESTRIL) 20 MG tablet Take 1 tablet (20 mg total) by mouth  daily. 90 tablet 3  . pravastatin (PRAVACHOL) 20 MG tablet TAKE 1 TABLET BY MOUTH ONCE DAILY 90 tablet 3  . tamsulosin (FLOMAX) 0.4 MG CAPS capsule Take 1 capsule (0.4 mg total) by mouth daily. 10 capsule 0  . traMADol (ULTRAM) 50 MG tablet Take 1/2- 1 tablet twice a day as needed for pain 30 tablet 0   No current facility-administered medications for this visit.     Allergies: Lipitor [atorvastatin]  Past Medical History:  Diagnosis Date  . 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 [name]   . Cataract   . Chronic kidney disease    kidney stone  . Colitis   . Colon cancer ([place]) 06/2017   cecum  . Diabetes mellitus without complication ([place])    no meds taken  . Emphysema of lung ([place])   . GERD (gastroesophageal reflux disease)   . Hyperlipidemia   . Hypertension   . Peripheral arterial disease ([place])   . [name]   . Stroke [facility])    November 2016- Thanksgiving night    Past Surgical History:  Procedure Laterality Date  . ABDOMINAL HYSTERECTOMY    . BALLOON DILATION N/A 12/23/2012   Procedure: BALLOON DILATION;  Surgeon: Inda Castle, MD;  Location: [facility];  Service: Endoscopy;  Laterality: N/A;  . BALLOON DILATION N/A 01/15/2013   Procedure: BALLOON DILATION;  Surgeon: Inda Castle, MD;  Location: [facility];  Service: Endoscopy;  Laterality: N/A;  . cataract     both eyes  . COLONOSCOPY    .  COLONOSCOPY WITH PROPOFOL N/A 03/05/2017   Procedure: COLONOSCOPY WITH PROPOFOL;  Surgeon: Mauri Pole, MD;  Location: [facility];  Service: Endoscopy;  Laterality: N/A;  . DENTAL RESTORATION/EXTRACTION WITH X-RAY    . ESOPHAGOGASTRODUODENOSCOPY N/A 12/23/2012   Procedure: ESOPHAGOGASTRODUODENOSCOPY (EGD);  Surgeon: Inda Castle, MD;  Location: [facility];  Service: Endoscopy;  Laterality: N/A;  . ESOPHAGOGASTRODUODENOSCOPY N/A 01/15/2013   Procedure: ESOPHAGOGASTRODUODENOSCOPY (EGD);  Surgeon: Inda Castle, MD;  Location: [facility];   Service: Endoscopy;  Laterality: N/A;  . TONSILLECTOMY      Family History  Problem Relation Age of Onset  . Hypertension Father   . Diabetes Father   . Cancer Father        melanoma  . Colon cancer Sister 32  . Cancer Sister        colon, surgery alone   . Early death Neg Hx   . Stroke Neg Hx   . Esophageal cancer Neg Hx   . Rectal cancer Neg Hx   . Stomach cancer Neg Hx     Social History   Tobacco Use  . Smoking status: Former Smoker    Packs/day: 1.00    Years: 55.00    Pack years: 55.00    Types: Cigarettes    Last attempt to quit: 10/19/2002    Years since quitting: 15.4  . Smokeless tobacco: Never Used  Substance Use Topics  . Alcohol use: No    Alcohol/week: 0.0 oz    Subjective:  Patient presents with her daughter with numerous concerns:  1) Low back pain x 2 weeks; feels like pain is radiating into both of her lower leg; denies any recent fall; wonders if she can get a refill on Tramadol; only uses when the pain is extreme; last Rx was written by surgeon at [facility] last October after surgery;  2) Would like to get her urine checked today to make sure she doesn't have an infection; 3) Worried about worsening circulation; notes that toes have been discolored for a while but feels that recently they are starting to "look black"  Objective:  Vitals:   03/18/18 1307  BP: 138/70  Pulse: 85  Temp: 98.1 F (36.7 C)  TempSrc: Oral  SpO2: 94%  Weight: 120 lb 6.4 oz (54.6 kg)  Height: 5\' 1"  (1.549 m)    General: Well developed, well nourished, in no acute distress  Skin : Warm and dry.  Head: Normocephalic and atraumatic  Lungs: Respirations unlabored;  Musculoskeletal: No deformities; no active joint inflammation  Extremities: No edema, extensive cyanosis noted of toes on left foot  Neurologic: Alert and oriented; speech intact; face symmetrical; moves all extremities well; CNII-XII intact without focal deficit  Assessment:  1. Urinary frequency   2. Low back  pain potentially associated with radiculopathy   3. Decreased circulation     Plan:  1. Check U/A and urine culture today; 2. Update lumbar X-ray; agreed to #30 refill on Tramadol; 3. Refer to vascular surgeon for further evaluation;   No follow-ups on file.  Orders Placed This Encounter  Procedures  . Urine Culture    Standing Status:   Future    Number of Occurrences:   1    Standing Expiration Date:   03/18/2019  . DG Lumbar Spine 2-3 Views    Standing Status:   Future    Number of Occurrences:   1    Standing Expiration Date:   05/20/2019    Order Specific Question:  Reason for Exam (SYMPTOM  OR DIAGNOSIS REQUIRED)    Answer:   back pain with radiculopathy    Order Specific Question:   Preferred imaging location?    Answer:   [facility]    Order Specific Question:   Radiology Contrast Protocol - do NOT remove file path    Answer:   [email or web address]  . Urinalysis    Standing Status:   Future    Number of Occurrences:   1    Standing Expiration Date:   03/18/2019  . Ambulatory referral to Vascular Surgery    Referral Priority:   Routine    Referral Type:   Surgical    Referral Reason:   Specialty Services Required    Requested Specialty:   Vascular Surgery    Number of Visits Requested:   1    Requested Prescriptions   Signed Prescriptions Disp Refills  . traMADol (ULTRAM) 50 MG tablet 30 tablet 0    Sig: Take 1/2- 1 tablet twice a day as needed for pain

## 2018-03-19 ENCOUNTER — Other Ambulatory Visit: Payer: Self-pay

## 2018-03-19 DIAGNOSIS — I999 Unspecified disorder of circulatory system: Secondary | ICD-10-CM

## 2018-03-19 LAB — URINE CULTURE
MICRO NUMBER: 90840839
SPECIMEN QUALITY: ADEQUATE

## 2018-03-20 ENCOUNTER — Ambulatory Visit: Payer: Self-pay | Admitting: *Deleted

## 2018-03-20 ENCOUNTER — Telehealth: Payer: Self-pay | Admitting: Family

## 2018-03-20 NOTE — Telephone Encounter (Signed)
Copied from [place] [id number]. Topic: Quick Communication - Lab Results >> Mar 20, 2018  8:54 AM Carolyn Stare wrote:  Sherry Lambert would like a call back about her lab and xray results

## 2018-03-20 NOTE — Telephone Encounter (Signed)
Pt called with having constipation. She normally goes twice a day but has been going once a day. She said it became worst when she was having problems with her bladder and had to see a urologist.  She started taking Senokot, 2 on Tuesday night and 3 on Wednesday. Finally had a bm today with a lot of effort. She was unable to read the strength on the bottle. So I could not tell her the dosage or frequency of how she could take this.  She is denying abd pain, nausea or vomiting or fever. She is voiding without difficulty. She is drinking water and not much in the way of caffeine .  She refused to have an appointment scheduled. She will follow the home care that was offered her and call back if not having good results. Will route to [facility]. Advised to call back for increase in her symptoms. Pt voiced understanding.  Reason for Disposition . Unable to have a bowel movement (BM) without laxative or enema  Answer Assessment - Initial Assessment Questions 1. STOOL PATTERN OR FREQUENCY: "How often do you pass bowel movements (BMs)?"  (Normal range: tid to q 3 days)  "When was the last BM passed?"       Once a day or more often twice a day 2. STRAINING: "Do you have to strain to have a BM?"      yes 3. RECTAL PAIN: "Does your rectum hurt when the stool comes out?" If so, ask: "Do you have hemorrhoids? How bad is the pain?"  (Scale 1-10; or mild, moderate, severe)     No pain, or hemorrhoids 4. STOOL COMPOSITION: "Are the stools hard?"      Sort of hard 5. BLOOD ON STOOLS: "Has there been any blood on the toilet tissue or on the surface of the BM?" If so, ask: "When was the last time?"      no 6. CHRONIC CONSTIPATION: "Is this a new problem for you?"  If no, ask: "How long have you had this problem?" (days, weeks, months)      Month ago 7. CHANGES IN DIET: "Have there been any recent changes in your diet?"      no 8. MEDICATIONS: "Have you been taking any new medications?"     no 9.  LAXATIVES: "Have you been using any laxatives or enemas?"  If yes, ask "What, how often, and when was the last time?"     Senokot and will try a fleets enema 10. CAUSE: "What do you think is causing the constipation?"        Bladder problem?  11. OTHER SYMPTOMS: "Do you have any other symptoms?" (e.g., abdominal pain, fever, vomiting)       no  Protocols used: CONSTIPATION-A-AH

## 2018-03-20 NOTE — Telephone Encounter (Signed)
Spoke with patient today and info given regarding her lumbar xrays. I did advise her that once you received her lab results back then I would call her with results.

## 2018-03-20 NOTE — Telephone Encounter (Signed)
Noted  

## 2018-03-21 ENCOUNTER — Encounter: Payer: Self-pay | Admitting: Adult Health

## 2018-03-21 ENCOUNTER — Ambulatory Visit: Payer: Medicare HMO | Admitting: Adult Health

## 2018-03-21 ENCOUNTER — Ambulatory Visit (INDEPENDENT_AMBULATORY_CARE_PROVIDER_SITE_OTHER)
Admission: RE | Admit: 2018-03-21 | Discharge: 2018-03-21 | Disposition: A | Discharge status: Home / Self Care | Payer: Medicare HMO | Source: Ambulatory Visit | Attending: Adult Health | Admitting: Adult Health

## 2018-03-21 ENCOUNTER — Ambulatory Visit: Payer: Medicare HMO | Admitting: Hematology

## 2018-03-21 ENCOUNTER — Other Ambulatory Visit: Payer: Medicare HMO

## 2018-03-21 VITALS — BP 122/78 | HR 94 | Temp 98.5°F | Ht 60.0 in | Wt 120.0 lb

## 2018-03-21 DIAGNOSIS — J449 Chronic obstructive pulmonary disease, unspecified: Secondary | ICD-10-CM | POA: Diagnosis not present

## 2018-03-21 DIAGNOSIS — J438 Other emphysema: Secondary | ICD-10-CM

## 2018-03-21 DIAGNOSIS — R0602 Shortness of breath: Secondary | ICD-10-CM | POA: Diagnosis not present

## 2018-03-21 NOTE — Assessment & Plan Note (Signed)
Severe COPD with increased symptoms with hot /humid weather  For now continue on TRELEGY  May use Albuterol nebs As needed   Check cxr   Plan Patient Instructions  Continue on TRELEGY 1 puff daily  Mucinex DM Twice daily  As needed  Cough/congestion .  Chest xray today .  Discuss with PCP that Lisinopril could aggravate cough .  Follow up with Dr. Vaughan Browner in 4 months and As needed   Please contact office for sooner follow up if symptoms do not improve or worsen or seek emergency care

## 2018-03-21 NOTE — Patient Instructions (Addendum)
Continue on TRELEGY 1 puff daily  Mucinex DM Twice daily  As needed  Cough/congestion .  Chest xray today .  Discuss with PCP that Lisinopril could aggravate cough .  Follow up with Dr. Vaughan Browner in 4 months and As needed   Please contact office for sooner follow up if symptoms do not improve or worsen or seek emergency care

## 2018-03-21 NOTE — Progress Notes (Signed)
@Patient  ID: Sherry Lambert, female    DOB: Feb 12, 1941, 77 y.o.   MRN: [id number]  Chief Complaint  Patient presents with  . Acute Visit    dyspnea     Referring provider: [name], *  HPI: 77 year old female former smoker followed for moderate to severe COPD  TEST  PFT (02/13/16) FEV1/FVC 45%, FEV1 0.82 46%. No hyperinflation. DLCO 51% Postbronchodilator FEV1 53%, ratio 47, FVC 84, positive bronchodilator response. Spirometry 06/2017 -stable moderate COPD with FEV1 at 59%, ratio 44, FVC 101%.    03/21/2018 Acute OV : COPD  Patient presents for an acute office visit.  She complains that she notices that at times her breathing is not as good . Gets winded easily . Denies increased cough or wheezing .  Worse with hot humid weather. Says today she feels fine not breathing problems but this week trying to go to appointments she got winded intermittently. Denies chest pain, orthopnea, edema or fever.  Wants to make sure she does not have pneumonia . Has had several times in past and wants to make sure.  Spirometry today does show drop in lung function with FEV1 39%.  No active cough or wheezing . She remains on TRELEGY  Daily.    Allergies  Allergen Reactions  . Lipitor [Atorvastatin] Itching    Immunization History  Administered Date(s) Administered  . Influenza, High Dose Seasonal PF 07/27/2013, 05/14/2016, 05/20/2017  . Influenza,inj,Quad PF,6+ Mos 08/03/2014, 06/21/2015  . Influenza-Unspecified 07/04/2012  . Pneumococcal Conjugate-13 11/11/2015  . Pneumococcal-Unspecified 09/03/2010  . Td 09/03/2012    Past Medical History:  Diagnosis Date  . 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 [name]   . Cataract   . Chronic kidney disease    kidney stone  . Colitis   . Colon cancer ([place]) 06/2017   cecum  . Diabetes mellitus without complication ([place])    no meds taken  . Emphysema of lung ([place])   . GERD (gastroesophageal reflux disease)   .  Hyperlipidemia   . Hypertension   . Peripheral arterial disease ([place])   . [name]   . Stroke [facility])    November 2016- Thanksgiving night    Tobacco History: Social History   Tobacco Use  Smoking Status Former Smoker  . Packs/day: 1.00  . Years: 55.00  . Pack years: 55.00  . Types: Cigarettes  . Last attempt to quit: 10/19/2002  . Years since quitting: 15.4  Smokeless Tobacco Never Used   Counseling given: Not Answered   Outpatient Medications Prior to Visit  Medication Sig Dispense Refill  . albuterol (PROVENTIL) (2.5 MG/3ML) 0.083% nebulizer solution Take 2.5 mg by nebulization every 6 (six) hours as needed for wheezing or shortness of breath.    Marland Kitchen aspirin EC 81 MG tablet Take 1 tablet (81 mg total) by mouth daily. 90 tablet 3  . diphenhydrAMINE (BENADRYL) 25 mg capsule Take by mouth.    . doxylamine, Sleep, (SOBA NIGHTTIME SLEEP AID) 25 MG tablet Take 25 mg by mouth at bedtime as needed (for sleep.).    Marland Kitchen Fluticasone-Umeclidin-Vilant (TRELEGY ELLIPTA) 100-62.5-25 MCG/INH AEPB Inhale 1 puff into the lungs daily. 1 each 11  . furosemide (LASIX) 20 MG tablet Take 1 tablet (20 mg total) by mouth daily as needed. 30 tablet 0  . gabapentin (NEURONTIN) 300 MG capsule TAKE 1 CAPSULE BY MOUTH THREE TIMES DAILY 90 capsule 3  . pravastatin (PRAVACHOL) 20 MG tablet TAKE 1 TABLET BY MOUTH ONCE  DAILY 90 tablet 3  . tamsulosin (FLOMAX) 0.4 MG CAPS capsule Take 1 capsule (0.4 mg total) by mouth daily. 10 capsule 0  . traMADol (ULTRAM) 50 MG tablet Take 1/2- 1 tablet twice a day as needed for pain 30 tablet 0  . lisinopril (PRINIVIL,ZESTRIL) 20 MG tablet Take 1 tablet (20 mg total) by mouth daily. (Patient not taking: Reported on 03/21/2018) 90 tablet 3   No facility-administered medications prior to visit.      Review of Systems  Constitutional:   No  weight loss, night sweats,  Fevers, chills,  +fatigue, or  lassitude.  HEENT:   No headaches,  Difficulty swallowing,   Tooth/dental problems, or  Sore throat,                No sneezing, itching, ear ache, nasal congestion, post nasal drip,   CV:  No chest pain,  Orthopnea, PND, swelling in lower extremities, anasarca, dizziness, palpitations, syncope.   GI  No heartburn, indigestion, abdominal pain, nausea, vomiting, diarrhea, change in bowel habits, loss of appetite, bloody stools.   Resp:    No wheezing.  No chest wall deformity  Skin: no rash or lesions.  GU: no dysuria, change in color of urine, no urgency or frequency.  No flank pain, no hematuria   MS:  No joint pain or swelling.  No decreased range of motion.  No back pain.    Physical Exam  BP 122/78 (BP Location: Left Arm, Cuff Size: Normal)   Pulse 94   Temp 98.5 F (36.9 C) (Oral)   Ht 5' (1.524 m)   Wt 120 lb (54.4 kg)   SpO2 95%   BMI 23.44 kg/m   GEN: A/Ox3; pleasant , NAD, thin and elderly , walker    HEENT:  [place]/AT,  EACs-clear, TMs-wnl, NOSE-clear, THROAT-clear, no lesions, no postnasal drip or exudate noted.   NECK:  Supple w/ fair ROM; no JVD; normal carotid impulses w/o bruits; no thyromegaly or nodules palpated; no lymphadenopathy.    RESP  Clear  P & A; w/o, wheezes/ rales/ or rhonchi. no accessory muscle use, no dullness to percussion  CARD:  RRR, no m/r/g, no peripheral edema, pulses intact, no cyanosis or clubbing.  GI:   Soft & nt; nml bowel sounds; no organomegaly or masses detected.   Musco: Warm bil, no deformities or joint swelling noted.   Neuro: alert, no focal deficits noted.    Skin: Warm, no lesions or rashes    Lab Results:  CBC    Component Value Date/Time   WBC 10.3 02/01/2018 0034   RBC 4.30 02/01/2018 0034   HGB 12.4 02/01/2018 0034   HGB 11.4 (L) 07/30/2017 1116   HCT 39.2 02/01/2018 0034   HCT 36.1 07/30/2017 1116   PLT 242 02/01/2018 0034   PLT 252 07/30/2017 1116   MCV 91.2 02/01/2018 0034   MCV 88.6 07/30/2017 1116   MCH 28.8 02/01/2018 0034   MCHC 31.6 02/01/2018 0034    RDW 14.7 02/01/2018 0034   RDW 14.3 07/30/2017 1116   LYMPHSABS 1.9 11/20/2017 0817   LYMPHSABS 1.5 07/30/2017 1116   MONOABS 0.6 11/20/2017 0817   MONOABS 0.5 07/30/2017 1116   EOSABS 0.1 11/20/2017 0817   EOSABS 0.1 07/30/2017 1116   BASOSABS 0.1 11/20/2017 0817   BASOSABS 0.1 07/30/2017 1116    BMET    Component Value Date/Time   NA 141 02/01/2018 0034   NA 142 07/30/2017 1116   K 3.5 02/01/2018 0034  K 3.6 07/30/2017 1116   CL 108 02/01/2018 0034   CO2 24 02/01/2018 0034   CO2 24 07/30/2017 1116   GLUCOSE 112 (H) 02/01/2018 0034   GLUCOSE 89 07/30/2017 1116   BUN 17 02/01/2018 0034   BUN 13.1 07/30/2017 1116   CREATININE 0.92 02/01/2018 0034   CREATININE 0.8 07/30/2017 1116   CALCIUM 9.3 02/01/2018 0034   CALCIUM 9.2 07/30/2017 1116   GFRNONAA 59 (L) 02/01/2018 0034   GFRAA >60 02/01/2018 0034    BNP No results found for: BNP  ProBNP No results found for: PROBNP  Imaging: Dg Lumbar Spine 2-3 Views  Result Date: 03/19/2018 CLINICAL DATA:  Chronic lower back pain and bilateral lower extremity radiculopathy. EXAM: LUMBAR SPINE - 2-3 VIEW COMPARISON:  CT abdomen pelvis dated February 01, 2018. FINDINGS: Five lumbar type vertebral bodies. No acute fracture or subluxation. Unchanged mild inferior endplate T12 compression fracture. Remaining vertebral body heights are preserved. Alignment is normal. Unchanged mild disc height loss and facet arthropathy at L4-L5 and L5-S1. Osteopenia. The sacroiliac joints are unremarkable. Aortic atherosclerosis. IMPRESSION: 1. Mild lower lumbar degenerative changes, similar to prior study. 2. Chronic T12 compression fracture, unchanged. Electronically Signed   By: Titus Dubin M.D.   On: 03/19/2018 08:07     Assessment & Plan:   COPD (chronic obstructive pulmonary disease) (HCC) Severe COPD with increased symptoms with hot /humid weather  For now continue on TRELEGY  May use Albuterol nebs As needed   Check cxr   Plan Patient  Instructions  Continue on TRELEGY 1 puff daily  Mucinex DM Twice daily  As needed  Cough/congestion .  Chest xray today .  Discuss with PCP that Lisinopril could aggravate cough .  Follow up with Dr. Vaughan Browner in 4 months and As needed   Please contact office for sooner follow up if symptoms do not improve or worsen or seek emergency care           Rexene Edison, NP 03/21/2018

## 2018-03-24 NOTE — Progress Notes (Addendum)
[facility]  [phone] [phone]  Clinic Follow up Note   Patient Care Team: [name], MD as PCP - General (Internal Medicine) 03/25/2018  SUMMARY OF ONCOLOGIC HISTORY: Oncology History   Cancer Staging Adenocarcinoma of cecum [facility]) Staging form: Colon and Rectum - Neuroendocine Tumors, AJCC 8th Edition - Pathologic stage from 06/20/2017: Stage IIB (pT3, pN0, cM0) - Unsigned - Clinical: No stage assigned - Unsigned       Adenocarcinoma of cecum ([place])   12/05/2016 Imaging    CT CAP IMPRESSION: Right middle lobe atelectasis.  Moderate size hiatal hernia.  Aortic atherosclerosis.  Irregular solid density seen in tip of cecum most consistent with residual stool, but further evaluation with barium enema or colonoscopy is recommended to rule out mass.      03/05/2017 Procedure    Colonoscopy per Dr. Silverio Decamp  The digital rectal exam revealed a rectal mass. The mass was non-circumferential and located predominantly at the posterior bowel wall. Findings: A frond-like/villous, infiltrative and ulcerated partially obstructing large mass was found in the cecum. The mass was partially circumferential (involving two-thirds of the lumen circumference). The mass measured seven cm in length. In addition, its diameter measured >thirty mm. Oozing was present. This was biopsied with a cold jumbo forceps for histology. A frond-like/villous non-obstructing medium-sized mass was found in the distal rectum within 5 cm from anal verge. The mass was non-circumferential. The mass measured two cm in length. In addition, its diameter measured 20 mm. No bleeding was present. This was biopsied with a cold forceps for histology.      03/07/2017 Pathology Results    Diagnosis 1. Colon, biopsy, cecal - TUBULOVILLOUS ADENOMA (3 OF 3 FRAGMENTS) - NO HIGH GRADE DYSPLASIA OR MALIGNANCY IDENTIFIED 2. Rectum, biopsy - TUBULOVILLOUS ADENOMA (MULTIPLE  FRAGMENTS) - NO HIGH GRADE DYSPLASIA OR MALIGNANCY IDENTIFIED      05/28/2017 Procedure    [facility] Mont D[facility]   The perianal and digital rectal exams were normal.  A polypoid non--obstructing large mass was found in the cecum.  The mass was circumferential (involving 100% of the lumen circumference).  In addition, the diameter measured 5-7 mm biopsies were taken with cold forceps for histology.  A 20 mm polyp was found in the rectum (benign-appearing lesion).  The polyp was sessile.  Preparations were made for mucosal resection.  Piecemeal mucosal resection using a snare was performed.  Resection and retrieval were complete.        05/28/2017 Pathology Results    Specimens: A) - Colon, cecal mass bxs at/et   B) - Rectum, rectal polyp EMR at/et    DIAGNOSIS A.  Cecal mass, endoscopic biopsy:  Tubulovillous adenoma with high grade dysplasia.  No evidence of invasive carcinoma.  B.  Rectal polyp, endoscopic mucosal resection:  Tubulovillous adenoma with high grade dysplasia.  No evidence of invasive carcinoma. Surgical margin is free of high grade dysplasia.   Clinical Information Rectal mass, cecal mass.  The colonoscopy impression:Polypoid mass in the cecum. Biopsied. - One benign appearing 20 mm polyp in the rectum piecemeal removed with mucosal resection. Resected and retrieved. - Mucosal resection was performed. Resection and retrieval were complete.          06/20/2017 Initial Diagnosis    Pathology results from right hemicolectomy  A.  Terminal ileum, appendix, right colon, resection: Invasive colonic adenocarcinoma, moderately differentiated Surgical margins are free of tumor 18 lymph nodes, negative for carcinoma (0/18)  Tumor site: cecum  Histologic type: Adenocarcinoma Histologic grade: Moderately differentiated, G2 Tumor size:  Greatest dimension in centimeters: 3.9 cm Tumor Extension: Tumor invades through the muscularis propria into pericolorectal rectal tissue Macroscopic tumor perforation not identified Lymphovascular invasion not identified Perineural invasion not identified All margins are uninvolved by invasive carcinoma, high-grade dysplasia, intramucosal adenocarcinoma, and adenoma   Primary tumor pT3 Regional lymph nodes pN0  MSI stable      06/20/2017 Procedure    Surgeries Performed: Procedure(s): LAPAROSCOPY, SURGICAL; RIGHT COLECTOMY, PARTIAL, WITH ANASTOMOSIS laparoscopic possible right hemicolectomy per Dr. Ronita Hipps       06/20/2017 Initial Diagnosis    Adenocarcinoma of cecum ([place])      CURRENT THERAPY: Surveillance   INTERVAL HISTORY: Sherry Lambert is a 77 y.o. female who is here for follow-up. She is here on a wheelchair with her daughter. She is not feeling well. She states that she had a stroke 4 years ago and her nerves are hurting her. She is taking Gabapentin, but it's not helping much. She is complaining of pain in all extremities, and this started after her stroke.  She is following up with her neurologist. She cannot walk due to sciatica and lower back arthritis. She had a CT scan done in June due to bladder and back pain. It revealed a fracture in T spine, but no cancer metastasis. It was normal otherwise. She gets constipated sometimes and uses OTC medications irregularly.   REVIEW OF SYSTEMS:   Constitutional: Denies fevers, chills or abnormal weight loss Eyes: Denies blurriness of vision Ears, nose, mouth, throat, and face: Denies mucositis or sore throat Respiratory: Denies cough, dyspnea or wheezes Cardiovascular: Denies palpitation, chest discomfort or lower extremity swelling Gastrointestinal:  Denies nausea, heartburn or change in bowel habits (+) constipation Skin: Denies abnormal skin rashes Lymphatics: Denies new lymphadenopathy or easy  bruising Neurological:Denies numbness, tingling or new weaknesses (+) pain in her extremities after a stroke 4 years ago MSK: (+) back pain Behavioral/Psych: Mood is stable, no new changes  All other systems were reviewed with the patient and are negative.  MEDICAL HISTORY:  Past Medical History:  Diagnosis Date  . 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 [name]   . Cataract   . Chronic kidney disease    kidney stone  . Colitis   . Colon cancer ([place]) 06/2017   cecum  . Diabetes mellitus without complication ([place])    no meds taken  . Emphysema of lung ([place])   . GERD (gastroesophageal reflux disease)   . Hyperlipidemia   . Hypertension   . Peripheral arterial disease ([place])   . [name]   . Stroke [facility])    November 2016- Thanksgiving night    SURGICAL HISTORY: Past Surgical History:  Procedure Laterality Date  . ABDOMINAL HYSTERECTOMY    . BALLOON DILATION N/A 12/23/2012   Procedure: BALLOON DILATION;  Surgeon: Inda Castle, MD;  Location: [facility];  Service: Endoscopy;  Laterality: N/A;  . BALLOON DILATION N/A 01/15/2013   Procedure: BALLOON DILATION;  Surgeon: Inda Castle, MD;  Location: [facility] ENDOSCOPY;  Service: Endoscopy;  Laterality: N/A;  . cataract     both eyes  . COLONOSCOPY    . COLONOSCOPY WITH PROPOFOL N/A 03/05/2017   Procedure: COLONOSCOPY WITH PROPOFOL;  Surgeon: Mauri Pole, MD;  Location: [facility] ENDOSCOPY;  Service: Endoscopy;  Laterality: N/A;  . DENTAL RESTORATION/EXTRACTION WITH X-RAY    . ESOPHAGOGASTRODUODENOSCOPY N/A 12/23/2012   Procedure: ESOPHAGOGASTRODUODENOSCOPY (EGD);  Surgeon: Herbie Baltimore  Shaaron Adler, MD;  Location: [facility];  Service: Endoscopy;  Laterality: N/A;  . ESOPHAGOGASTRODUODENOSCOPY N/A 01/15/2013   Procedure: ESOPHAGOGASTRODUODENOSCOPY (EGD);  Surgeon: Inda Castle, MD;  Location: [facility];  Service: Endoscopy;  Laterality: N/A;  . TONSILLECTOMY      I have reviewed the social history and family  history with the patient and they are unchanged from previous note.  ALLERGIES:  is allergic to lipitor [atorvastatin].  MEDICATIONS:  Current Outpatient Medications  Medication Sig Dispense Refill  . albuterol (PROVENTIL) (2.5 MG/3ML) 0.083% nebulizer solution Take 2.5 mg by nebulization every 6 (six) hours as needed for wheezing or shortness of breath.    Marland Kitchen aspirin EC 81 MG tablet Take 1 tablet (81 mg total) by mouth daily. 90 tablet 3  . diphenhydrAMINE (BENADRYL) 25 mg capsule Take by mouth.    . doxylamine, Sleep, (SOBA NIGHTTIME SLEEP AID) 25 MG tablet Take 25 mg by mouth at bedtime as needed (for sleep.).    Marland Kitchen Fluticasone-Umeclidin-Vilant (TRELEGY ELLIPTA) 100-62.5-25 MCG/INH AEPB Inhale 1 puff into the lungs daily. 1 each 11  . furosemide (LASIX) 20 MG tablet Take 1 tablet (20 mg total) by mouth daily as needed. 30 tablet 0  . gabapentin (NEURONTIN) 300 MG capsule TAKE 1 CAPSULE BY MOUTH THREE TIMES DAILY 90 capsule 3  . lisinopril (PRINIVIL,ZESTRIL) 20 MG tablet Take 1 tablet (20 mg total) by mouth daily. 90 tablet 3  . pravastatin (PRAVACHOL) 20 MG tablet TAKE 1 TABLET BY MOUTH ONCE DAILY 90 tablet 3  . tamsulosin (FLOMAX) 0.4 MG CAPS capsule Take 1 capsule (0.4 mg total) by mouth daily. 10 capsule 0  . traMADol (ULTRAM) 50 MG tablet Take 1/2- 1 tablet twice a day as needed for pain 30 tablet 0   No current facility-administered medications for this visit.     PHYSICAL EXAMINATION: ECOG PERFORMANCE STATUS: 2 - Symptomatic, <50% confined to bed  Vitals:   03/25/18 1130  BP: (!) 143/64  Pulse: 76  Resp: 17  Temp: 98.3 F (36.8 C)  SpO2: 95%   Filed Weights   03/25/18 1130  Weight: 119 lb 8 oz (54.2 kg)    GENERAL:alert, no distress and comfortable (+) wheelchair  SKIN: skin color, texture, turgor are normal, no rashes or significant lesions EYES: normal, Conjunctiva are pink and non-injected, sclera clear OROPHARYNX:no exudate, no erythema and lips, buccal mucosa,  and tongue normal  NECK: supple, thyroid normal size, non-tender, without nodularity LYMPH:  no palpable lymphadenopathy in the cervical, axillary or inguinal LUNGS: clear to auscultation and percussion with normal breathing effort HEART: regular rate & rhythm and no murmurs and no lower extremity edema ABDOMEN:abdomen soft, non-tender and normal bowel sounds Musculoskeletal:no cyanosis of digits and no clubbing  NEURO: alert & oriented x 3 with fluent speech  LABORATORY DATA:  I have reviewed the data as listed CBC Latest Ref Rng & Units 03/25/2018 02/01/2018 11/20/2017  WBC 3.9 - 10.3 K/uL 8.6 10.3 9.5  Hemoglobin 11.6 - 15.9 g/dL 13.7 12.4 13.5  Hematocrit 34.8 - 46.6 % 42.4 39.2 41.7  Platelets 145 - 400 K/uL 268 242 233     CMP Latest Ref Rng & Units 03/25/2018 02/01/2018 11/20/2017  Glucose 70 - 99 mg/dL 84 112(H) 106  BUN 8 - 23 mg/dL '15 17 14  ' Creatinine 0.44 - 1.00 mg/dL 0.86 0.92 0.86  Sodium 135 - 145 mmol/L 140 141 143  Potassium 3.5 - 5.1 mmol/L 3.9 3.5 3.3(L)  Chloride 98 - 111 mmol/L  105 108 108  CO2 22 - 32 mmol/L '27 24 26  ' Calcium 8.9 - 10.3 mg/dL 9.6 9.3 10.2  Total Protein 6.5 - 8.1 g/dL 7.2 6.6 7.1  Total Bilirubin 0.3 - 1.2 mg/dL 0.3 0.6 0.4  Alkaline Phos 38 - 126 U/L 105 75 86  AST 15 - 41 U/L '19 17 14  ' ALT 0 - 44 U/L '17 14 11   ' TUMOR MARKERS Results for NOVICE, VRBA (MRN [id number]) as of 03/24/2018 08:07  Ref. Range 07/30/2017 11:16 11/20/2017 08:35  CEA ([facility]) Latest Ref Range: 0.00 - 5.00 ng/mL 2.15 2.32    RADIOGRAPHIC STUDIES: I have personally reviewed the radiological images as listed and agreed with the findings in the report. No results found.   02/01/2018 CT AP W Contrast IMPRESSION: 1. New T12 inferior endplate fracture with mild loss of vertebral body height, age indeterminate. 2. Aortic atherosclerosis. 3. Stable moderate hiatal hernia. 4. Scattered sigmoid diverticulosis, no findings of acute diverticulitis.  ASSESSMENT & PLAN:   77 y.o. 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 Referred from [facility] for management of adenocarcinoma of the cecum  1.Invasive colonic adenocarcinomaof the cecum, moderately differentiatedG2; pT3, pN0, cM0, MSI-stable; stage IIB -I have previously reviewed her surgical pathology and scan findings and discussed with patient in details.    -We discussed her risk of recurrence is moderate to low, giving her early stage disease, no high risk features.  Adjuvant chemotherapy was not recommended.   -Patient is clinically stable, she does have multiple complaints, but not concerning for cancer recurrence.  -Labs reviewed, CBC and CMP unremarkable, CEA normal; her exam was also unremarkable  -Continue colon cancer surveillance; colonoscopy will be due 06/2018.  -she had CT abdomen pelvis with contrast done in June 2019 in ED showed no evidence of cancer recurrence - Follow-up and labs in 5-6 months, will repeat surveillance CT scan in a year  2.  History of stroke with residual right-sided weakness -She was previous seen by neurologist, has been discharged. -I encouraged her to follow-up with her primary care physician  3. HTN, DM and COPD -f/u with PCP   4. Osteoporosis and T12 fracture in 02/2018 - I advised to take Calcium and vitamin D -her CT scan from 02/01/2018 showed new T12 inferior endplate fracture with mild loss of vertebral.  Patient was not aware of the results.  I discussed with her.  She denies any significant mid back pain. -Likely related to osteoporosis, I strongly encouraged her to discuss with her primary care physician about biphosphonate   PLAN:  -Lab and recent CT scan reviewed, no evidence of recurrence.  Continue surveillance  -She is due for colonoscopy in September 2019, I encouraged her to call Dr.  Woodward Ku  office - Follow-up and labs in 5-6 months, and I will order a CT scan for next year.   No problem-specific Assessment & Plan notes found for this encounter.   No orders of the defined types were placed in this encounter.  All questions were answered. The patient knows to call the clinic with any problems, questions or concerns. No barriers to learning was detected. I spent 25 minutes counseling the patient face to face. The total time spent in the appointment was 30 minutes and more than 50% was on counseling and review of test results     Krista Blue  Burr Medico, MD  03/25/2018

## 2018-03-25 ENCOUNTER — Encounter: Payer: Self-pay | Admitting: Hematology

## 2018-03-25 ENCOUNTER — Inpatient Hospital Stay: Payer: Medicare HMO | Admitting: Hematology

## 2018-03-25 ENCOUNTER — Inpatient Hospital Stay: Payer: Medicare HMO | Attending: Hematology

## 2018-03-25 VITALS — BP 143/64 | HR 76 | Temp 98.3°F | Resp 17 | Ht 60.0 in | Wt 119.5 lb

## 2018-03-25 DIAGNOSIS — M8008XA Age-related osteoporosis with current pathological fracture, vertebra(e), initial encounter for fracture: Secondary | ICD-10-CM | POA: Diagnosis not present

## 2018-03-25 DIAGNOSIS — J449 Chronic obstructive pulmonary disease, unspecified: Secondary | ICD-10-CM | POA: Diagnosis not present

## 2018-03-25 DIAGNOSIS — I10 Essential (primary) hypertension: Secondary | ICD-10-CM | POA: Diagnosis not present

## 2018-03-25 DIAGNOSIS — E119 Type 2 diabetes mellitus without complications: Secondary | ICD-10-CM | POA: Insufficient documentation

## 2018-03-25 DIAGNOSIS — Z8673 Personal history of transient ischemic attack (TIA), and cerebral infarction without residual deficits: Secondary | ICD-10-CM | POA: Diagnosis not present

## 2018-03-25 DIAGNOSIS — C180 Malignant neoplasm of cecum: Secondary | ICD-10-CM | POA: Insufficient documentation

## 2018-03-25 LAB — CBC WITH DIFFERENTIAL/PLATELET
BASOS PCT: 0 %
Basophils Absolute: 0 10*3/uL (ref 0.0–0.1)
EOS ABS: 0.1 10*3/uL (ref 0.0–0.5)
Eosinophils Relative: 1 %
HCT: 42.4 % (ref 34.8–46.6)
HEMOGLOBIN: 13.7 g/dL (ref 11.6–15.9)
Lymphocytes Relative: 18 %
Lymphs Abs: 1.5 10*3/uL (ref 0.9–3.3)
MCH: 29.1 pg (ref 25.1–34.0)
MCHC: 32.3 g/dL (ref 31.5–36.0)
MCV: 90.2 fL (ref 79.5–101.0)
MONOS PCT: 7 %
Monocytes Absolute: 0.6 10*3/uL (ref 0.1–0.9)
NEUTROS PCT: 74 %
Neutro Abs: 6.4 10*3/uL (ref 1.5–6.5)
Platelets: 268 10*3/uL (ref 145–400)
RBC: 4.7 MIL/uL (ref 3.70–5.45)
RDW: 13.5 % (ref 11.2–14.5)
WBC: 8.6 10*3/uL (ref 3.9–10.3)

## 2018-03-25 LAB — COMPREHENSIVE METABOLIC PANEL
ALK PHOS: 105 U/L (ref 38–126)
ALT: 17 U/L (ref 0–44)
AST: 19 U/L (ref 15–41)
Albumin: 3.8 g/dL (ref 3.5–5.0)
Anion gap: 8 (ref 5–15)
BUN: 15 mg/dL (ref 8–23)
CO2: 27 mmol/L (ref 22–32)
CREATININE: 0.86 mg/dL (ref 0.44–1.00)
Calcium: 9.6 mg/dL (ref 8.9–10.3)
Chloride: 105 mmol/L (ref 98–111)
Glucose, Bld: 84 mg/dL (ref 70–99)
Potassium: 3.9 mmol/L (ref 3.5–5.1)
Sodium: 140 mmol/L (ref 135–145)
Total Bilirubin: 0.3 mg/dL (ref 0.3–1.2)
Total Protein: 7.2 g/dL (ref 6.5–8.1)

## 2018-03-25 LAB — CEA (IN HOUSE-CHCC): CEA (CHCC-IN HOUSE): 2.31 ng/mL (ref 0.00–5.00)

## 2018-03-26 ENCOUNTER — Telehealth: Payer: Self-pay | Admitting: Hematology

## 2018-03-26 NOTE — Telephone Encounter (Signed)
Appointments scheduled Calendar/letter mailed and spoke with patient also per 7/23 los

## 2018-03-27 ENCOUNTER — Telehealth: Payer: Self-pay | Admitting: Internal Medicine

## 2018-03-27 NOTE — Telephone Encounter (Signed)
Yes, this is okay for short term usage.

## 2018-03-27 NOTE — Telephone Encounter (Signed)
Copied from [place] [phone]. Topic: General - Other >> Mar 27, 2018 10:02 AM Yvette Rack wrote: Reason for CRM: pt cancer doctor would like to know if pt can take Ibuprofen

## 2018-03-27 NOTE — Telephone Encounter (Signed)
Patient informed of MD response and stated understanding  

## 2018-03-28 ENCOUNTER — Telehealth: Payer: Self-pay

## 2018-03-28 NOTE — Telephone Encounter (Signed)
Per Cira Rue NP notified patient CEA, CBC and CMP were all normal.  Patient verbalized an understanding.

## 2018-03-28 NOTE — Telephone Encounter (Signed)
-----   Message from Alla Feeling, NP sent at 03/27/2018  8:00 PM EDT ----- Please let her know CEA tumor marker, CBC, and CMP are all normal.  Thanks, Regan Rakers NP

## 2018-04-21 DIAGNOSIS — M1711 Unilateral primary osteoarthritis, right knee: Secondary | ICD-10-CM | POA: Diagnosis not present

## 2018-04-21 DIAGNOSIS — M722 Plantar fascial fibromatosis: Secondary | ICD-10-CM | POA: Diagnosis not present

## 2018-04-21 DIAGNOSIS — M25561 Pain in right knee: Secondary | ICD-10-CM | POA: Diagnosis not present

## 2018-04-24 ENCOUNTER — Other Ambulatory Visit: Payer: Self-pay | Admitting: Internal Medicine

## 2018-04-24 NOTE — Telephone Encounter (Signed)
Copied from [place] [phone]. Topic: Quick Communication - See Telephone Encounter >> Apr 24, 2018  2:34 PM Mylinda Latina, NT wrote: CRM for notification. See Telephone encounter for: 04/24/18. Patient call and states she is currently taking Fluticasone-Umeclidin-Vilant (TRELEGY ELLIPTA) 100-62.5-25 MCG/INH AEPB  and she is in a donut hole and the medicaiton cost too much. She states her insurance will approved for PROVENTIL) (2.5 MG/3ML) 0.083% nebulizer solution. [facility] is requesiting a refil of her [place], [place] [phone] (Phone) [phone] (Fax)

## 2018-04-24 NOTE — Telephone Encounter (Signed)
Please advise 

## 2018-04-24 NOTE — Telephone Encounter (Signed)
Patient call and states she is currently taking Fluticasone-Umeclidin-Vilant (TRELEGY ELLIPTA) 100-62.5-25 MCG/INH AEPB  and she is in a donut hole and the medicaiton costs too much. She states her insurance will approved for PROVENTIL) (2.5 MG/3ML) 0.083% nebulizer solution.  Proventil nebulizer refill Last refill:02/28/17 (expired) Last OV:07/02/17 YYP:[id number] Pharmacy: [facility] [address], [place] [id number] (Phone) [phone] (Fax)

## 2018-04-25 MED ORDER — ALBUTEROL SULFATE HFA 108 (90 BASE) MCG/ACT IN AERS: 2.0000 | INHALATION_SPRAY | Freq: Four times a day (QID) | RESPIRATORY_TRACT | 0 refills | Status: DC | PRN

## 2018-04-25 NOTE — Telephone Encounter (Signed)
Patient is going to call back and let us know which one is covered after she calls insurance company

## 2018-04-25 NOTE — Telephone Encounter (Signed)
Sent in ventolin and removed albuterol solution as this was not appropriate medication. This is not a replacement for trelegy and does not work the same. This is for shortness of breath and will not increase her breathing capacity like trelegy does. We would still recommend trelegy as this helps her breathing.

## 2018-04-25 NOTE — Telephone Encounter (Signed)
Does she have a nebulizer?

## 2018-04-25 NOTE — Telephone Encounter (Signed)
Is there a specific brand which is covered by insurance? Proair, ventolin, proventil are the brands.

## 2018-04-25 NOTE — Telephone Encounter (Signed)
Patient is calling back to advise  Ventolin is covered by her insurance .

## 2018-04-25 NOTE — Telephone Encounter (Signed)
Called patient back. Patient is wanting the albuterol inhaler not the nebulizer solution. Wants it instead of trillegy because she is in the donut hole and trillegy is 119 dollars and the albuterol inhaler is only 47 dollars

## 2018-05-12 ENCOUNTER — Encounter: Payer: Self-pay | Admitting: Surgery

## 2018-05-12 ENCOUNTER — Ambulatory Visit (HOSPITAL_COMMUNITY)
Admission: RE | Admit: 2018-05-12 | Discharge: 2018-05-12 | Disposition: A | Discharge status: Home / Self Care | Payer: Medicare HMO | Source: Ambulatory Visit | Attending: Surgery | Admitting: Surgery

## 2018-05-12 ENCOUNTER — Other Ambulatory Visit: Payer: Self-pay

## 2018-05-12 ENCOUNTER — Ambulatory Visit: Payer: Medicare HMO | Admitting: Surgery

## 2018-05-12 VITALS — BP 130/72 | HR 71 | Temp 97.7°F | Resp 14 | Ht 60.0 in | Wt 117.0 lb

## 2018-05-12 DIAGNOSIS — I999 Unspecified disorder of circulatory system: Secondary | ICD-10-CM

## 2018-05-12 NOTE — Progress Notes (Signed)
Vitals:   05/12/18 1107  BP: (!) 151/70  Pulse: 71  Resp: 14  Temp: 97.7 F (36.5 C)  TempSrc: Oral  SpO2: 96%  Weight: 117 lb (53.1 kg)  Height: 5' (1.524 m)

## 2018-05-12 NOTE — Progress Notes (Signed)
[facility] [place]  Patient name: Sherry Lambert MRN: [id number] DOB: 02/03/1941 Sex: female   REQUESTING PROVIDER:    Jodi Mourning, N.P.   REASON FOR CONSULT:    Decreased circulation  HISTORY OF PRESENT ILLNESS:   Sherry Lambert is a 77 y.o. female, who is referred today for evaluation of decreased circulation.  The patient states that approximately 1 year ago, her toes began to turn a charcoal color.  This is not associated with any pain.  Her walking is limited by her gait instability.  She does use a rolling walker.  She has a history of stroke which affected the right side of her body.  She does not have any open wounds.  She denies rest pain.  She does have lower extremity edema, for which she wears compression stockings.  Patient has been diagnosed with diabetes which is diet-controlled.  She is a former smoker, and has COPD.  She is medically managed for hypertension.  She has undergone resection of a cecal cancer  PAST MEDICAL HISTORY    Past Medical History:  Diagnosis Date  . 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 [name]   . Cataract   . Chronic kidney disease    kidney stone  . Colitis   . Colon cancer ([place]) 06/2017   cecum  . Diabetes mellitus without complication ([place])    no meds taken  . Emphysema of lung ([place])   . GERD (gastroesophageal reflux disease)   . Hyperlipidemia   . Hypertension   . Peripheral arterial disease ([place])   . [name]   . Stroke [facility])    November 2016- Thanksgiving night     FAMILY HISTORY   Family History  Problem Relation Age of Onset  . Hypertension Father   . Diabetes Father   . Cancer Father        melanoma  . Colon cancer Sister 37  . Cancer Sister        colon, surgery alone   . Early death Neg Hx   . Stroke Neg Hx   . Esophageal cancer Neg Hx   . Rectal cancer Neg Hx   . Stomach cancer Neg Hx     SOCIAL HISTORY:   Social  History   Socioeconomic History  . Marital status: Divorced    Spouse name: Not on file  . Number of children: 1  . Years of education: Not on file  . Highest education level: Not on file  Occupational History  . Occupation: Retired    Comment: Hydrographic surveyor  . Financial resource strain: Not on file  . Food insecurity:    Worry: Not on file    Inability: Not on file  . Transportation needs:    Medical: Not on file    Non-medical: Not on file  Tobacco Use  . Smoking status: Former Smoker    Packs/day: 1.00    Years: 55.00    Pack years: 55.00    Types: Cigarettes    Last attempt to quit: 10/19/2002    Years since quitting: 15.5  . Smokeless tobacco: Never Used  Substance and Sexual Activity  . Alcohol use: No    Alcohol/week: 0.0 standard drinks  . Drug use: No  . Sexual activity: Never    Birth control/protection: Surgical  Lifestyle  . Physical activity:    Days per week: Not on file    Minutes per session: Not  on file  . Stress: Not on file  Relationships  . Social connections:    Talks on phone: Not on file    Gets together: Not on file    Attends religious service: Not on file    Active member of club or organization: Not on file    Attends meetings of clubs or organizations: Not on file    Relationship status: Not on file  . Intimate partner violence:    Fear of current or ex partner: Not on file    Emotionally abused: Not on file    Physically abused: Not on file    Forced sexual activity: Not on file  Other Topics Concern  . Not on file  Social History Narrative   Regular exercise-no   Caffeine Use-yes    ALLERGIES:    Allergies  Allergen Reactions  . Other   . Lipitor [Atorvastatin] Itching    CURRENT MEDICATIONS:    Current Outpatient Medications  Medication Sig Dispense Refill  . albuterol (PROVENTIL HFA;VENTOLIN HFA) 108 (90 Base) MCG/ACT inhaler Inhale 2 puffs into the lungs every 6 (six) hours as needed for wheezing or  shortness of breath. 1 Inhaler 0  . aspirin EC 81 MG tablet Take 1 tablet (81 mg total) by mouth daily. 90 tablet 3  . diphenhydrAMINE (BENADRYL) 25 mg capsule Take by mouth.    . doxylamine, Sleep, (SOBA NIGHTTIME SLEEP AID) 25 MG tablet Take 25 mg by mouth at bedtime as needed (for sleep.).    Marland Kitchen Fluticasone-Umeclidin-Vilant (TRELEGY ELLIPTA) 100-62.5-25 MCG/INH AEPB Inhale 1 puff into the lungs daily. 1 each 11  . furosemide (LASIX) 20 MG tablet Take 1 tablet (20 mg total) by mouth daily as needed. 30 tablet 0  . gabapentin (NEURONTIN) 300 MG capsule TAKE 1 CAPSULE BY MOUTH THREE TIMES DAILY 90 capsule 3  . pravastatin (PRAVACHOL) 20 MG tablet TAKE 1 TABLET BY MOUTH ONCE DAILY 90 tablet 3  . tamsulosin (FLOMAX) 0.4 MG CAPS capsule Take 1 capsule (0.4 mg total) by mouth daily. 10 capsule 0  . lisinopril (PRINIVIL,ZESTRIL) 20 MG tablet Take 1 tablet (20 mg total) by mouth daily. (Patient not taking: Reported on 05/12/2018) 90 tablet 3  . traMADol (ULTRAM) 50 MG tablet Take 1/2- 1 tablet twice a day as needed for pain (Patient not taking: Reported on 05/12/2018) 30 tablet 0   No current facility-administered medications for this visit.     REVIEW OF SYSTEMS:   [X]  denotes positive finding, [ ]  denotes negative finding Cardiac  Comments:  Chest pain or chest pressure:    [name] upon exertion:    Short of breath when lying flat:    Irregular heart rhythm:        Vascular    Pain in calf, thigh, or hip brought on by ambulation:    Pain in feet at night that wakes you up from your sleep:     Blood clot in your veins:    Leg swelling:         Pulmonary    Oxygen at home:    Productive cough:     Wheezing:         Neurologic    Sudden weakness in arms or legs:      Sudden numbness in arms or legs:  x   Sudden onset of difficulty speaking or slurred speech:    Temporary loss of vision in one eye:     Problems with dizziness:  Gastrointestinal    [name]:        Vomited blood:         Genitourinary    Burning when urinating:     Blood in urine:        Psychiatric    Major depression:         Hematologic    Bleeding problems:    Problems with blood clotting too easily:        Skin    Rashes or ulcers:        Constitutional    Fever or chills:     PHYSICAL EXAM:   Vitals:   05/12/18 1107 05/12/18 1113  BP: (!) 151/70 130/72  Pulse: 71 71  Resp: 14   Temp: 97.7 F (36.5 C)   TempSrc: Oral   SpO2: 96%   Weight: 117 lb (53.1 kg)   Height: 5' (1.524 m)     GENERAL: The patient is a well-nourished female, in no acute distress. The vital signs are documented above. CARDIAC: There is a regular rate and rhythm.  VASCULAR: Palpable posterior tibial pulse bilaterally.  No carotid bruits. PULMONARY: Nonlabored respirations ABDOMEN: Soft and non-tender with normal pitched bowel sounds.  No pulsatile mass MUSCULOSKELETAL: There are no major deformities or cyanosis. NEUROLOGIC: No focal weakness or paresthesias are detected. SKIN: There are no ulcers or rashes noted. PSYCHIATRIC: The patient has a normal affect.  STUDIES:   I have ordered and reviewed her vascular lab studies with the following findings:  Right ABI: 0.93, triphasic waveforms Left ABI: 1.03, triphasic waveforms  Right toe pressure= 80 Left toe pressure= 76  ASSESSMENT and PLAN   I discussed with the patient that I do not feel she has significant vascular disease down to her ankle, as reflected in the fact that she has palpable posterior tibial pulses and normal ankle-brachial indices.  Her toe pressures are abnormal indicating that she likely has a component of diabetes affecting the small arteries in her feet.  I have told her to continue to wear compression stockings for her lower extremity edema.  She should wear shoes with socks at all times and be very meticulous about taking care of her feet.  I told her to have a podiatrist cut her toenails instead of  herself. No vascular intervention is warranted at this time.  I have asked her to contact me should any issues arise in the future.   Annamarie Major, MD [facility] [phone] Pager [phone]

## 2018-05-26 ENCOUNTER — Other Ambulatory Visit: Payer: Self-pay | Admitting: Internal Medicine

## 2018-06-05 ENCOUNTER — Encounter: Payer: Self-pay | Admitting: Internal Medicine

## 2018-06-05 ENCOUNTER — Ambulatory Visit (INDEPENDENT_AMBULATORY_CARE_PROVIDER_SITE_OTHER): Payer: Medicare HMO | Admitting: Internal Medicine

## 2018-06-05 ENCOUNTER — Other Ambulatory Visit (INDEPENDENT_AMBULATORY_CARE_PROVIDER_SITE_OTHER): Payer: Medicare HMO

## 2018-06-05 VITALS — BP 136/90 | HR 74 | Temp 98.2°F | Ht 60.0 in | Wt 118.0 lb

## 2018-06-05 DIAGNOSIS — E118 Type 2 diabetes mellitus with unspecified complications: Secondary | ICD-10-CM | POA: Diagnosis not present

## 2018-06-05 DIAGNOSIS — Z0000 Encounter for general adult medical examination without abnormal findings: Secondary | ICD-10-CM

## 2018-06-05 DIAGNOSIS — E785 Hyperlipidemia, unspecified: Secondary | ICD-10-CM | POA: Diagnosis not present

## 2018-06-05 DIAGNOSIS — J449 Chronic obstructive pulmonary disease, unspecified: Secondary | ICD-10-CM

## 2018-06-05 DIAGNOSIS — Z23 Encounter for immunization: Secondary | ICD-10-CM

## 2018-06-05 DIAGNOSIS — E1169 Type 2 diabetes mellitus with other specified complication: Secondary | ICD-10-CM

## 2018-06-05 DIAGNOSIS — I10 Essential (primary) hypertension: Secondary | ICD-10-CM

## 2018-06-05 LAB — MICROALBUMIN / CREATININE URINE RATIO
Creatinine,U: 39 mg/dL
MICROALB UR: 2.7 mg/dL — AB (ref 0.0–1.9)
Microalb Creat Ratio: 6.8 mg/g (ref 0.0–30.0)

## 2018-06-05 LAB — LIPID PANEL
CHOL/HDL RATIO: 2
Cholesterol: 150 mg/dL (ref 0–200)
HDL: 72 mg/dL (ref >39.00)
LDL Cholesterol: 62 mg/dL (ref 0–99)
NONHDL: 77.66
Triglycerides: 78 mg/dL (ref 0.0–149.0)
VLDL: 15.6 mg/dL (ref 0.0–40.0)

## 2018-06-05 LAB — HEMOGLOBIN A1C: HEMOGLOBIN A1C: 6.5 % (ref 4.6–6.5)

## 2018-06-05 NOTE — Patient Instructions (Signed)

## 2018-06-05 NOTE — Progress Notes (Signed)
   Subjective:    Patient ID: Sherry Lambert, female    DOB: 1941/01/04, 77 y.o.   MRN: [id number]  HPI Here for medicare wellness and physical, no new complaints. Please see A/P for status and treatment of chronic medical problems.   Diet: DM since diabetic Physical activity: sedentary Depression/mood screen: negative Hearing: intact to whispered voice, mild loss bilaterally Visual acuity: grossly normal, performs annual eye exam  ADLs: capable Fall risk: none Home safety: good Cognitive evaluation: intact to orientation, naming, recall and repetition EOL planning: adv directives discussed  I have personally reviewed and have noted 1. The patient's medical and social history - reviewed today no changes 2. Their use of alcohol, tobacco or illicit drugs 3. Their current medications and supplements 4. The patient's functional ability including ADL's, fall risks, home safety risks and hearing or visual impairment. 5. Diet and physical activities 6. Evidence for depression or mood disorders 7. Care team reviewed and updated (available in snapshot)  Review of Systems  Constitutional: Negative.   HENT: Negative.   Eyes: Negative.   Respiratory: Negative for cough, chest tightness and shortness of breath.   Cardiovascular: Negative for chest pain, palpitations and leg swelling.  Gastrointestinal: Negative for abdominal distention, abdominal pain, constipation, diarrhea, nausea and vomiting.  Musculoskeletal: Negative.   Skin: Negative.   Neurological: Negative.   Psychiatric/Behavioral: Negative.       Objective:   Physical Exam  Constitutional: She is oriented to person, place, and time. She appears well-developed and well-nourished.  HENT:  Head: Normocephalic and atraumatic.  Eyes: EOM are normal.  Neck: Normal range of motion.  Cardiovascular: Normal rate and regular rhythm.  Pulmonary/Chest: Effort normal and breath sounds normal. No respiratory distress. She has no  wheezes. She has no rales.  Abdominal: Soft. Bowel sounds are normal. She exhibits no distension. There is no tenderness. There is no rebound.  Musculoskeletal: She exhibits no edema.  Neurological: She is alert and oriented to person, place, and time. Coordination normal.  Skin: Skin is warm and dry.  Psychiatric: She has a normal mood and affect.   Vitals:   06/05/18 1041  BP: 136/90  Pulse: 74  Temp: 98.2 F (36.8 C)  TempSrc: Oral  SpO2: 96%  Weight: 118 lb (53.5 kg)  Height: 5' (1.524 m)      Assessment & Plan:  Flu shot given at visit

## 2018-06-06 NOTE — Assessment & Plan Note (Signed)
Complicated by past stroke. Checking HgA1c, microalbumin, reminded about eye exam. Foot exam done. Diet controlled currently. Taking statin currently but not ACE-I or ARB.

## 2018-06-06 NOTE — Assessment & Plan Note (Signed)
BP at goal on lasix. Checking CMP and adjust as needed.

## 2018-06-06 NOTE — Assessment & Plan Note (Signed)
Also past stroke and LDL goal <70. On pravastatin 20 mg daily and increase as needed.

## 2018-06-06 NOTE — Assessment & Plan Note (Signed)
Trying alterative agents. No flare today.

## 2018-06-06 NOTE — Assessment & Plan Note (Signed)
Flu shot given. Pneumonia up to date. Shingrix counseled. Tetanus up to date. Colonoscopy up to date. Mammogram up to date, pap smear not indicated and dexa up to date. Counseled about sun safety and mole surveillance. Counseled about the dangers of distracted driving. Given 10 year screening recommendations.

## 2018-06-24 ENCOUNTER — Encounter: Payer: Self-pay | Admitting: Gastroenterology

## 2018-06-24 ENCOUNTER — Ambulatory Visit: Payer: Medicare HMO | Admitting: Gastroenterology

## 2018-06-24 VITALS — BP 134/76 | HR 92 | Ht 59.0 in | Wt 120.4 lb

## 2018-06-24 DIAGNOSIS — R1310 Dysphagia, unspecified: Secondary | ICD-10-CM

## 2018-06-24 DIAGNOSIS — K222 Esophageal obstruction: Secondary | ICD-10-CM

## 2018-06-24 DIAGNOSIS — R1319 Other dysphagia: Secondary | ICD-10-CM

## 2018-06-24 DIAGNOSIS — Z85038 Personal history of other malignant neoplasm of large intestine: Secondary | ICD-10-CM | POA: Diagnosis not present

## 2018-06-24 DIAGNOSIS — K219 Gastro-esophageal reflux disease without esophagitis: Secondary | ICD-10-CM

## 2018-06-24 NOTE — Progress Notes (Signed)
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  Patient is here with complaints of worsening intermittent dysphagia She has trouble with meat, bread and hush puppies.  Denies any episodes of food impactions, occasionally she has to regurgitate the food back if she cannot swallow it down.  Denies any heartburn or  Odynophagia. Bowel habits are normal, denies any dark stool or blood per rectum.  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  She had surgical resection for cecal mass with right hemicolectomy and rectal lesion was endoscopically removed at [facility] Encounter Medications as of 06/24/2018  Medication Sig  . albuterol (PROVENTIL HFA;VENTOLIN HFA) 108 (90 Base) MCG/ACT inhaler Inhale 2 puffs into the lungs every 6 (six) hours as needed for wheezing or shortness of breath.  Marland Kitchen aspirin EC 81 MG tablet Take 1 tablet (81 mg total) by mouth daily.  . diphenhydrAMINE (BENADRYL) 25 mg capsule Take by mouth.  . doxylamine, Sleep, (SOBA NIGHTTIME SLEEP AID) 25 MG tablet Take 25 mg by mouth at bedtime as needed (for sleep.).  Marland Kitchen gabapentin (NEURONTIN) 300 MG capsule TAKE 1 CAPSULE BY MOUTH THREE TIMES DAILY  . pravastatin (PRAVACHOL) 20 MG tablet TAKE 1 TABLET BY MOUTH ONCE DAILY  . furosemide (LASIX) 20 MG tablet Take 1 tablet (20 mg total) by mouth daily as needed. (Patient not taking: Reported on 06/24/2018)  . traMADol (ULTRAM) 50 MG tablet Take 1/2- 1 tablet twice a day as needed for pain (Patient not taking:  Reported on 06/24/2018)  . [DISCONTINUED] tamsulosin (FLOMAX) 0.4 MG CAPS capsule Take 1 capsule (0.4 mg total) by mouth daily. (Patient not taking: Reported on 06/24/2018)  . [DISCONTINUED] TRELEGY ELLIPTA 100-62.5-25 MCG/INH AEPB INHALE 1 PUFF INTO LUNGS ONCE DAILY (Patient not taking: Reported on 06/24/2018)   No facility-administered encounter medications on file as of 06/24/2018.     Allergies as of 06/24/2018 - Review Complete 06/24/2018  Allergen Reaction Noted  . Lipitor [atorvastatin] Itching 12/20/2014    Past Medical History:  Diagnosis Date  . 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 [name]   . Cataract   . Chronic kidney disease    kidney stone  . Colitis   . Colon cancer ([place]) 06/2017   cecum  . Diabetes mellitus without complication ([place])    no meds taken  . Emphysema of lung ([place])   . GERD (gastroesophageal reflux disease)   . Hyperlipidemia   . Hypertension   . Peripheral arterial disease ([place])   . [name]   . Stroke [facility])    November 2016- Thanksgiving night    Past Surgical History:  Procedure Laterality Date  . ABDOMINAL HYSTERECTOMY    . BALLOON DILATION N/A 12/23/2012   Procedure: BALLOON DILATION;  Surgeon: Inda Castle, MD;  Location: [facility];  Service: Endoscopy;  Laterality: N/A;  . BALLOON DILATION N/A 01/15/2013   Procedure: BALLOON DILATION;  Surgeon: Inda Castle, MD;  Location: [facility];  Service: Endoscopy;  Laterality: N/A;  .  cataract     both eyes  . COLONOSCOPY    . COLONOSCOPY WITH PROPOFOL N/A 03/05/2017   Procedure: COLONOSCOPY WITH PROPOFOL;  Surgeon: Mauri Pole, MD;  Location: [facility];  Service: Endoscopy;  Laterality: N/A;  . DENTAL RESTORATION/EXTRACTION WITH X-RAY    . ESOPHAGOGASTRODUODENOSCOPY N/A 12/23/2012   Procedure: ESOPHAGOGASTRODUODENOSCOPY (EGD);  Surgeon: Inda Castle, MD;  Location: [facility];  Service: Endoscopy;  Laterality: N/A;  . ESOPHAGOGASTRODUODENOSCOPY  N/A 01/15/2013   Procedure: ESOPHAGOGASTRODUODENOSCOPY (EGD);  Surgeon: Inda Castle, MD;  Location: [facility];  Service: Endoscopy;  Laterality: N/A;  . TONSILLECTOMY      Family History  Problem Relation Age of Onset  . Hypertension Father   . Diabetes Father   . Cancer Father        melanoma  . Colon cancer Sister 39  . Cancer Sister        colon, surgery alone   . Early death Neg Hx   . Stroke Neg Hx   . Esophageal cancer Neg Hx   . Rectal cancer Neg Hx   . Stomach cancer Neg Hx     Social History   Socioeconomic History  . Marital status: Divorced    Spouse name: Not on file  . Number of children: 1  . Years of education: Not on file  . Highest education level: Not on file  Occupational History  . Occupation: Retired    Comment: Hydrographic surveyor  . Financial resource strain: Not on file  . Food insecurity:    Worry: Not on file    Inability: Not on file  . Transportation needs:    Medical: Not on file    Non-medical: Not on file  Tobacco Use  . Smoking status: Former Smoker    Packs/day: 1.00    Years: 55.00    Pack years: 55.00    Types: Cigarettes    Last attempt to quit: 10/19/2002    Years since quitting: 15.6  . Smokeless tobacco: Never Used  Substance and Sexual Activity  . Alcohol use: No    Alcohol/week: 0.0 standard drinks  . Drug use: No  . Sexual activity: Never    Birth control/protection: Surgical  Lifestyle  . Physical activity:    Days per week: Not on file    Minutes per session: Not on file  . Stress: Not on file  Relationships  . Social connections:    Talks on phone: Not on file    Gets together: Not on file    Attends religious service: Not on file    Active member of club or organization: Not on file    Attends meetings of clubs or organizations: Not on file    Relationship status: Not on file  . Intimate partner violence:    Fear of current or ex partner: Not on file    Emotionally abused: Not on file     Physically abused: Not on file    Forced sexual activity: Not on file  Other Topics Concern  . Not on file  Social History Narrative   Regular exercise-no   Caffeine Use-yes      Review of systems: Review of Systems  Constitutional: Negative for fever and chills.  HENT: Negative.   Eyes: Negative for blurred vision.  Respiratory: Negative for cough, shortness of breath and wheezing.   Cardiovascular: Negative for chest pain and palpitations.  Gastrointestinal: as per HPI Genitourinary: Negative for dysuria, urgency, frequency and  hematuria.  Musculoskeletal: Negative for myalgias, back pain and positive for joint pain.  Skin: Negative for itching and rash.  Neurological: Negative for dizziness, tremors, focal weakness, seizures and loss of consciousness.  If occult he with walking and balance Endo/Heme/Allergies: Positive for seasonal allergies.  Psychiatric/Behavioral: Negative for depression, suicidal ideas and hallucinations.  All other systems reviewed and are negative.   Physical Exam: Vitals:   06/24/18 1421  BP: 134/76  Pulse: 92   Body mass index is 24.31 kg/m. Gen:      No acute distress, ambulates with a walker HEENT:  EOMI, sclera anicteric Neck:     No masses; no thyromegaly Lungs:    Clear to auscultation bilaterally; normal respiratory effort CV:         Regular rate and rhythm; no murmurs Abd:      + bowel sounds; soft, non-tender; no palpable masses, no distension Ext:    + edema; adequate peripheral perfusion Skin:      Warm and dry; no rash Neuro: alert and oriented x 3 Psych: normal mood and affect  Data Reviewed:  Reviewed labs, radiology imaging, old records and pertinent past GI work up   Assessment and Plan/Recommendations:  77 year old female with history of hypertension, hyperlipidemia, COPD, stage II colon cancer status post right hemicolectomy October 2018, GERD with esophageal stricture here with complaints of worsening dysphagia We  will schedule for EGD with esophageal dilation Advised patient to avoid tough meat or dense bread until after esophageal dilation [facility] as she had a tight esophageal stricture, may need pediatric endoscope She is due for surveillance colonoscopy, will schedule it once dysphagia improves, to discuss at next visit The risks and benefits as well as alternatives of endoscopic procedure(s) have been discussed and reviewed. All questions answered. The patient agrees to proceed.   Damaris Hippo , MD [phone]    CC: [name], *

## 2018-06-24 NOTE — H&P (View-Only) (Signed)
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  Patient is here with complaints of worsening intermittent dysphagia She has trouble with meat, bread and hush puppies.  Denies any episodes of food impactions, occasionally she has to regurgitate the food back if she cannot swallow it down.  Denies any heartburn or  Odynophagia. Bowel habits are normal, denies any dark stool or blood per rectum.  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  She had surgical resection for cecal mass with right hemicolectomy and rectal lesion was endoscopically removed at [facility] Encounter Medications as of 06/24/2018  Medication Sig  . albuterol (PROVENTIL HFA;VENTOLIN HFA) 108 (90 Base) MCG/ACT inhaler Inhale 2 puffs into the lungs every 6 (six) hours as needed for wheezing or shortness of breath.  Marland Kitchen aspirin EC 81 MG tablet Take 1 tablet (81 mg total) by mouth daily.  . diphenhydrAMINE (BENADRYL) 25 mg capsule Take by mouth.  . doxylamine, Sleep, (SOBA NIGHTTIME SLEEP AID) 25 MG tablet Take 25 mg by mouth at bedtime as needed (for sleep.).  Marland Kitchen gabapentin (NEURONTIN) 300 MG capsule TAKE 1 CAPSULE BY MOUTH THREE TIMES DAILY  . pravastatin (PRAVACHOL) 20 MG tablet TAKE 1 TABLET BY MOUTH ONCE DAILY  . furosemide (LASIX) 20 MG tablet Take 1 tablet (20 mg total) by mouth daily as needed. (Patient not taking: Reported on 06/24/2018)  . traMADol (ULTRAM) 50 MG tablet Take 1/2- 1 tablet twice a day as needed for pain (Patient not taking:  Reported on 06/24/2018)  . [DISCONTINUED] tamsulosin (FLOMAX) 0.4 MG CAPS capsule Take 1 capsule (0.4 mg total) by mouth daily. (Patient not taking: Reported on 06/24/2018)  . [DISCONTINUED] TRELEGY ELLIPTA 100-62.5-25 MCG/INH AEPB INHALE 1 PUFF INTO LUNGS ONCE DAILY (Patient not taking: Reported on 06/24/2018)   No facility-administered encounter medications on file as of 06/24/2018.     Allergies as of 06/24/2018 - Review Complete 06/24/2018  Allergen Reaction Noted  . Lipitor [atorvastatin] Itching 12/20/2014    Past Medical History:  Diagnosis Date  . 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 [name]   . Cataract   . Chronic kidney disease    kidney stone  . Colitis   . Colon cancer ([place]) 06/2017   cecum  . Diabetes mellitus without complication ([place])    no meds taken  . Emphysema of lung ([place])   . GERD (gastroesophageal reflux disease)   . Hyperlipidemia   . Hypertension   . Peripheral arterial disease ([place])   . [name]   . Stroke [facility])    November 2016- Thanksgiving night    Past Surgical History:  Procedure Laterality Date  . ABDOMINAL HYSTERECTOMY    . BALLOON DILATION N/A 12/23/2012   Procedure: BALLOON DILATION;  Surgeon: Inda Castle, MD;  Location: [facility];  Service: Endoscopy;  Laterality: N/A;  . BALLOON DILATION N/A 01/15/2013   Procedure: BALLOON DILATION;  Surgeon: Inda Castle, MD;  Location: [facility];  Service: Endoscopy;  Laterality: N/A;  .  cataract     both eyes  . COLONOSCOPY    . COLONOSCOPY WITH PROPOFOL N/A 03/05/2017   Procedure: COLONOSCOPY WITH PROPOFOL;  Surgeon: Mauri Pole, MD;  Location: [facility] END[facility]COPY;  Service: Endoscopy;  Laterality: N/A;  . DENTAL RESTORATION/EXTRACTION WITH X-RAY    . ESOPHAGOGASTRODUODENOSCOPY N/A 12/23/2012   Procedure: ESOPHAGOGASTRODUODENOSCOPY (EGD);  Surgeon: Inda Castle, MD;  Location: [facility];  Service: Endoscopy;  Laterality: N/A;  . ESOPHAGOGASTRODUODENOSCOPY  N/A 01/15/2013   Procedure: ESOPHAGOGASTRODUODENOSCOPY (EGD);  Surgeon: Inda Castle, MD;  Location: [facility];  Service: Endoscopy;  Laterality: N/A;  . TONSILLECTOMY      Family History  Problem Relation Age of Onset  . Hypertension Father   . Diabetes Father   . Cancer Father        melanoma  . Colon cancer Sister 74  . Cancer Sister        colon, surgery alone   . Early death Neg Hx   . Stroke Neg Hx   . Esophageal cancer Neg Hx   . Rectal cancer Neg Hx   . Stomach cancer Neg Hx     Social History   Socioeconomic History  . Marital status: Divorced    Spouse name: Not on file  . Number of children: 1  . Years of education: Not on file  . Highest education level: Not on file  Occupational History  . Occupation: Retired    Comment: Hydrographic surveyor  . Financial resource strain: Not on file  . Food insecurity:    Worry: Not on file    Inability: Not on file  . Transportation needs:    Medical: Not on file    Non-medical: Not on file  Tobacco Use  . Smoking status: Former Smoker    Packs/day: 1.00    Years: 55.00    Pack years: 55.00    Types: Cigarettes    Last attempt to quit: 10/19/2002    Years since quitting: 15.6  . Smokeless tobacco: Never Used  Substance and Sexual Activity  . Alcohol use: No    Alcohol/week: 0.0 standard drinks  . Drug use: No  . Sexual activity: Never    Birth control/protection: Surgical  Lifestyle  . Physical activity:    Days per week: Not on file    Minutes per session: Not on file  . Stress: Not on file  Relationships  . Social connections:    Talks on phone: Not on file    Gets together: Not on file    Attends religious service: Not on file    Active member of club or organization: Not on file    Attends meetings of clubs or organizations: Not on file    Relationship status: Not on file  . Intimate partner violence:    Fear of current or ex partner: Not on file    Emotionally abused: Not on file     Physically abused: Not on file    Forced sexual activity: Not on file  Other Topics Concern  . Not on file  Social History Narrative   Regular exercise-no   Caffeine Use-yes      Review of systems: Review of Systems  Constitutional: Negative for fever and chills.  HENT: Negative.   Eyes: Negative for blurred vision.  Respiratory: Negative for cough, shortness of breath and wheezing.   Cardiovascular: Negative for chest pain and palpitations.  Gastrointestinal: as per HPI Genitourinary: Negative for dysuria, urgency, frequency and  hematuria.  Musculoskeletal: Negative for myalgias, back pain and positive for joint pain.  Skin: Negative for itching and rash.  Neurological: Negative for dizziness, tremors, focal weakness, seizures and loss of consciousness.  If occult he with walking and balance Endo/Heme/Allergies: Positive for seasonal allergies.  Psychiatric/Behavioral: Negative for depression, suicidal ideas and hallucinations.  All other systems reviewed and are negative.   Physical Exam: Vitals:   06/24/18 1421  BP: 134/76  Pulse: 92   Body mass index is 24.31 kg/m. Gen:      No acute distress, ambulates with a walker HEENT:  EOMI, sclera anicteric Neck:     No masses; no thyromegaly Lungs:    Clear to auscultation bilaterally; normal respiratory effort CV:         Regular rate and rhythm; no murmurs Abd:      + bowel sounds; soft, non-tender; no palpable masses, no distension Ext:    + edema; adequate peripheral perfusion Skin:      Warm and dry; no rash Neuro: alert and oriented x 3 Psych: normal mood and affect  Data Reviewed:  Reviewed labs, radiology imaging, old records and pertinent past GI work up   Assessment and Plan/Recommendations:  77 year old female with history of hypertension, hyperlipidemia, COPD, stage II colon cancer status post right hemicolectomy October 2018, GERD with esophageal stricture here with complaints of worsening dysphagia We  will schedule for EGD with esophageal dilation Advised patient to avoid tough meat or dense bread until after esophageal dilation We will schedule the procedure at [facility] endoscopy as she had a tight esophageal stricture, may need pediatric endoscope She is due for surveillance colonoscopy, will schedule it once dysphagia improves, to discuss at next visit The risks and benefits as well as alternatives of endoscopic procedure(s) have been discussed and reviewed. All questions answered. The patient agrees to proceed.   Damaris Hippo , MD [phone]    CC: [name], *

## 2018-06-24 NOTE — Patient Instructions (Signed)
You have been scheduled for a Endoscopy at [facility], separate instructions have been given  If you are age 76 or older, your body mass index should be between 23-30. Your Body mass index is 24.31 kg/m. If this is out of the aforementioned range listed, please consider follow up with your Primary Care Provider.  If you are age 26 or younger, your body mass index should be between 19-25. Your Body mass index is 24.31 kg/m. If this is out of the aformentioned range listed, please consider follow up with your Primary Care Provider.    Thank you for choosing [facility]  Karleen Hampshire Nandigam,MD

## 2018-07-15 ENCOUNTER — Other Ambulatory Visit: Payer: Self-pay | Admitting: Internal Medicine

## 2018-07-21 ENCOUNTER — Encounter (HOSPITAL_COMMUNITY): Payer: Self-pay | Admitting: Emergency Medicine

## 2018-07-21 ENCOUNTER — Other Ambulatory Visit: Payer: Self-pay

## 2018-07-22 ENCOUNTER — Ambulatory Visit (HOSPITAL_COMMUNITY)
Admission: RE | Admit: 2018-07-22 | Discharge: 2018-07-22 | Disposition: A | Discharge status: Home / Self Care | Payer: Medicare HMO | Source: Ambulatory Visit | Attending: Gastroenterology | Admitting: Gastroenterology

## 2018-07-22 ENCOUNTER — Encounter (HOSPITAL_COMMUNITY)
Admission: RE | Disposition: A | Discharge status: Home / Self Care | Payer: Self-pay | Source: Ambulatory Visit | Attending: Gastroenterology

## 2018-07-22 ENCOUNTER — Ambulatory Visit (HOSPITAL_COMMUNITY): Payer: Medicare HMO | Admitting: Anesthesiology

## 2018-07-22 ENCOUNTER — Encounter (HOSPITAL_COMMUNITY): Payer: Self-pay | Admitting: Gastroenterology

## 2018-07-22 DIAGNOSIS — E1151 Type 2 diabetes mellitus with diabetic peripheral angiopathy without gangrene: Secondary | ICD-10-CM | POA: Diagnosis not present

## 2018-07-22 DIAGNOSIS — Z7982 Long term (current) use of aspirin: Secondary | ICD-10-CM | POA: Diagnosis not present

## 2018-07-22 DIAGNOSIS — Z79899 Other long term (current) drug therapy: Secondary | ICD-10-CM | POA: Insufficient documentation

## 2018-07-22 DIAGNOSIS — Z87442 Personal history of urinary calculi: Secondary | ICD-10-CM | POA: Diagnosis not present

## 2018-07-22 DIAGNOSIS — R1319 Other dysphagia: Secondary | ICD-10-CM

## 2018-07-22 DIAGNOSIS — R1310 Dysphagia, unspecified: Secondary | ICD-10-CM

## 2018-07-22 DIAGNOSIS — E785 Hyperlipidemia, unspecified: Secondary | ICD-10-CM | POA: Insufficient documentation

## 2018-07-22 DIAGNOSIS — N189 Chronic kidney disease, unspecified: Secondary | ICD-10-CM | POA: Insufficient documentation

## 2018-07-22 DIAGNOSIS — J449 Chronic obstructive pulmonary disease, unspecified: Secondary | ICD-10-CM | POA: Diagnosis not present

## 2018-07-22 DIAGNOSIS — Z85038 Personal history of other malignant neoplasm of large intestine: Secondary | ICD-10-CM | POA: Diagnosis not present

## 2018-07-22 DIAGNOSIS — I129 Hypertensive chronic kidney disease with stage 1 through stage 4 chronic kidney disease, or unspecified chronic kidney disease: Secondary | ICD-10-CM | POA: Diagnosis not present

## 2018-07-22 DIAGNOSIS — K2970 Gastritis, unspecified, without bleeding: Secondary | ICD-10-CM | POA: Insufficient documentation

## 2018-07-22 DIAGNOSIS — K449 Diaphragmatic hernia without obstruction or gangrene: Secondary | ICD-10-CM | POA: Insufficient documentation

## 2018-07-22 DIAGNOSIS — E1122 Type 2 diabetes mellitus with diabetic chronic kidney disease: Secondary | ICD-10-CM | POA: Insufficient documentation

## 2018-07-22 DIAGNOSIS — Z8673 Personal history of transient ischemic attack (TIA), and cerebral infarction without residual deficits: Secondary | ICD-10-CM | POA: Diagnosis not present

## 2018-07-22 DIAGNOSIS — K222 Esophageal obstruction: Secondary | ICD-10-CM | POA: Insufficient documentation

## 2018-07-22 DIAGNOSIS — K219 Gastro-esophageal reflux disease without esophagitis: Secondary | ICD-10-CM | POA: Insufficient documentation

## 2018-07-22 DIAGNOSIS — Z87891 Personal history of nicotine dependence: Secondary | ICD-10-CM | POA: Insufficient documentation

## 2018-07-22 HISTORY — PX: BALLOON DILATION: SHX5330

## 2018-07-22 HISTORY — PX: ESOPHAGOGASTRODUODENOSCOPY (EGD) WITH PROPOFOL: SHX5813

## 2018-07-22 SURGERY — ESOPHAGOGASTRODUODENOSCOPY (EGD) WITH PROPOFOL
Anesthesia: Monitor Anesthesia Care

## 2018-07-22 MED ORDER — PROPOFOL 10 MG/ML IV BOLUS
INTRAVENOUS | Status: AC
  Filled 2018-07-22: qty 40

## 2018-07-22 MED ORDER — LACTATED RINGERS IV SOLN
INTRAVENOUS | Status: DC
  Administered 2018-07-22: 1000 mL via INTRAVENOUS

## 2018-07-22 MED ORDER — OMEPRAZOLE 40 MG PO CPDR: 40.0000 mg | DELAYED_RELEASE_CAPSULE | Freq: Every day | ORAL | 3 refills | Status: DC

## 2018-07-22 MED ORDER — PROPOFOL 10 MG/ML IV BOLUS
INTRAVENOUS | Status: DC | PRN
  Administered 2018-07-22: 10 mg via INTRAVENOUS
  Administered 2018-07-22: 20 mg via INTRAVENOUS
  Administered 2018-07-22: 10 mg via INTRAVENOUS
  Administered 2018-07-22: 20 mg via INTRAVENOUS
  Administered 2018-07-22: 10 mg via INTRAVENOUS
  Administered 2018-07-22 (×2): 20 mg via INTRAVENOUS

## 2018-07-22 MED ORDER — PROPOFOL 500 MG/50ML IV EMUL
INTRAVENOUS | Status: DC | PRN
  Administered 2018-07-22: 125 ug/kg/min via INTRAVENOUS

## 2018-07-22 MED ORDER — SODIUM CHLORIDE 0.9 % IV SOLN: INTRAVENOUS | Status: DC

## 2018-07-22 SURGICAL SUPPLY — 15 items

## 2018-07-22 NOTE — Discharge Instructions (Signed)
YOU HAD AN ENDOSCOPIC PROCEDURE TODAY: Refer to the procedure report and other information in the discharge instructions given to you for any specific questions about what was found during the examination. If this information does not answer your questions, please call [place] office at [phone] to clarify.  ° °YOU SHOULD EXPECT: Some feelings of bloating in the abdomen. Passage of more gas than usual. Walking can help get rid of the air that was put into your GI tract during the procedure and reduce the bloating. If you had a lower endoscopy (such as a colonoscopy or flexible sigmoidoscopy) you may notice spotting of blood in your stool or on the toilet paper. Some abdominal soreness may be present for a day or two, also. ° °DIET: Your first meal following the procedure should be a light meal and then it is ok to progress to your normal diet. A half-sandwich or bowl of soup is an example of a good first meal. Heavy or fried foods are harder to digest and may make you feel nauseous or bloated. Drink plenty of fluids but you should avoid alcoholic beverages for 24 hours. If you had a esophageal dilation, please see attached instructions for diet.   ° °ACTIVITY: Your care partner should take you home directly after the procedure. You should plan to take it easy, moving slowly for the rest of the day. You can resume normal activity the day after the procedure however YOU SHOULD NOT DRIVE, use power tools, machinery or perform tasks that involve climbing or major physical exertion for 24 hours (because of the sedation medicines used during the test).  ° °SYMPTOMS TO REPORT IMMEDIATELY: °A gastroenterologist can be reached at any hour. Please call [phone]  for any of the following symptoms:  °Following lower endoscopy (colonoscopy, flexible sigmoidoscopy) °Excessive amounts of blood in the stool  °Significant tenderness, worsening of abdominal pains  °Swelling of the abdomen that is new, acute  °Fever of 100° or  higher  °Following upper endoscopy (EGD, EUS, ERCP, esophageal dilation) °Vomiting of blood or coffee ground material  °New, significant abdominal pain  °New, significant chest pain or pain under the shoulder blades  °Painful or persistently difficult swallowing  °New shortness of breath  °Black, tarry-looking or red, bloody stools ° °FOLLOW UP:  °If any biopsies were taken you will be contacted by phone or by letter within the next 1-3 weeks. Call [phone]  if you have not heard about the biopsies in 3 weeks.  °Please also call with any specific questions about appointments or follow up tests. ° °

## 2018-07-22 NOTE — Anesthesia Postprocedure Evaluation (Signed)
Anesthesia Post Note  Patient: Sherry Lambert  Procedure(s) Performed: ESOPHAGOGASTRODUODENOSCOPY (EGD) WITH PROPOFOL (N/A ) BALLOON DILATION (N/A )     Patient location during evaluation: PACU Anesthesia Type: MAC Level of consciousness: awake and alert Pain management: pain level controlled Vital Signs Assessment: post-procedure vital signs reviewed and stable Respiratory status: spontaneous breathing, nonlabored ventilation, respiratory function stable and patient connected to nasal cannula oxygen Cardiovascular status: stable and blood pressure returned to baseline Postop Assessment: no apparent nausea or vomiting Anesthetic complications: no    Last Vitals:  Vitals:   07/22/18 1150 07/22/18 1200  BP: 119/73 (!) 100/38  Pulse: 77 68  Resp: 19 19  Temp:    SpO2: 97% 100%    Last Pain:  Vitals:   07/22/18 1200  TempSrc:   PainSc: 0-No pain                 Effie Berkshire

## 2018-07-22 NOTE — Anesthesia Preprocedure Evaluation (Addendum)
Anesthesia Evaluation  Patient identified by MRN, date of birth, ID band Patient awake    Reviewed: Allergy & Precautions, NPO status , Patient's Chart, lab work & pertinent test results  Airway Mallampati: I  TM Distance: >3 FB Neck ROM: Full    Dental  (+) Upper Dentures, Partial Lower, Dental Advisory Given   Pulmonary COPD,  COPD inhaler, former smoker,    breath sounds clear to auscultation       Cardiovascular hypertension, + Peripheral Vascular Disease   Rhythm:Regular Rate:Normal     Neuro/Psych CVA    GI/Hepatic Neg liver ROS, GERD  ,  Endo/Other  diabetes, Type 2  Renal/GU      Musculoskeletal negative musculoskeletal ROS (+)   Abdominal Normal abdominal exam  (+)   Peds  Hematology negative hematology ROS (+)   Anesthesia Other Findings - HLD  Reproductive/Obstetrics                            Anesthesia Physical Anesthesia Plan  ASA: II  Anesthesia Plan: MAC   Post-op Pain Management:    Induction: Intravenous  PONV Risk Score and Plan: Ondansetron  Airway Management Planned: Natural Airway and Nasal Cannula  Additional Equipment: None  Intra-op Plan:   Post-operative Plan:   Informed Consent: I have reviewed the patients History and Physical, chart, labs and discussed the procedure including the risks, benefits and alternatives for the proposed anesthesia with the patient or authorized representative who has indicated his/her understanding and acceptance.     Plan Discussed with: CRNA  Anesthesia Plan Comments:        Anesthesia Quick Evaluation

## 2018-07-22 NOTE — Op Note (Addendum)
[facility] Patient Name: Sherry Lambert Procedure Date: 07/22/2018 MRN: [id number] Attending MD: Mauri Pole , MD Date of Birth: 1940/09/21 CSN: [id number] Age: 77 Admit Type: Outpatient Procedure:                Upper GI endoscopy Indications:              Dysphagia Providers:                Mauri Pole, MD, Angus Seller, Cletis Athens, Technician, Derrek Gu Alday CRNA, CRNA Referring MD:              Medicines:                Monitored Anesthesia Care Complications:            No immediate complications. Estimated Blood Loss:     Estimated blood loss was minimal. Procedure:                Pre-Anesthesia Assessment:                           - Prior to the procedure, a History and Physical                            was performed, and patient medications and                            allergies were reviewed. The patient's tolerance of                            previous anesthesia was also reviewed. The risks                            and benefits of the procedure and the sedation                            options and risks were discussed with the patient.                            All questions were answered, and informed consent                            was obtained. Prior Anticoagulants: The patient has                            taken no previous anticoagulant or antiplatelet                            agents. ASA Grade Assessment: III - A patient with                            severe systemic disease. After reviewing the risks  and benefits, the patient was deemed in                            satisfactory condition to undergo the procedure.                           After obtaining informed consent, the endoscope was                            passed under direct vision. Throughout the                            procedure, the patient's blood pressure, pulse, and       oxygen saturations were monitored continuously. The                            GIF-H190 ([id number]) Olympus adult endoscope was                            introduced through the mouth, and advanced to the                            second part of duodenum. The patient tolerated the                            procedure well. The upper GI endoscopy was somewhat                            difficult due to stricture. Scope In: Scope Out: Findings:      Two benign-appearing, intrinsic severe (stenosis; an endoscope cannot       pass) stenoses were found 29 to 30 cm from the incisors. The narrowest       stenosis measured 8 mm (inner diameter) x 1 cm (in length). The stenoses       were traversed after dilation. A TTS dilator was passed through the       scope. Dilation with a 06-14-11 mm x 5.5 cm CRE balloon and a 12-13.5-15       mm x 5.5 cm CRE balloon dilator was performed to 15 mm. The dilation       site was examined following endoscope reinsertion and showed moderate       mucosal disruption and complete resolution of luminal narrowing.      A large hiatal hernia was present.      Patchy mild inflammation characterized by congestion (edema), erythema       and mucus was found in the gastric antrum.      The examined duodenum was normal. Impression:               - Benign-appearing esophageal stenoses. Dilated.                           - Large hiatal hernia.                           - Gastritis.                           -  Normal examined duodenum.                           - No specimens collected. Moderate Sedation:      Not Applicable - Patient had care per Anesthesia. Recommendation:           - Patient has a contact number available for                            emergencies. The signs and symptoms of potential                            delayed complications were discussed with the                            patient. Return to normal activities tomorrow.                             Written discharge instructions were provided to the                            patient.                           - Full liquid diet today, then advance as tolerated                            to mechanical soft diet for 1 week.                           - No ibuprofen, naproxen, or other non-steroidal                            anti-inflammatory drugs for 2 weeks.                           - Return to my office at the next available                            appointment.                           - Use Prilosec (omeprazole) 40 mg PO daily, 30                            minutes before breakfast indefinitely.                           - Follow an antireflux regimen indefinitely. This                            includes:                           - Do not lie down for at least 3 to 4 hours after  meals.                           - Raise the head of the bed 4 to 6 inches.                           - Decrease excess weight.                           - Avoid citrus juices and other acidic foods,                            alcohol, chocolate, mints, coffee and other                            caffeinated beverages, carbonated beverages, fatty                            and fried foods.                           - Avoid tight-fitting clothing.                           - Avoid cigarettes and other tobacco products. Procedure Code(s):        --- Professional ---                           [id number], Esophagogastroduodenoscopy, flexible,                            transoral; with transendoscopic balloon dilation of                            esophagus (less than 30 mm diameter) Diagnosis Code(s):        --- Professional ---                           K22.2, Esophageal obstruction                           K44.9, Diaphragmatic hernia without obstruction or                            gangrene                           K29.70, Gastritis, unspecified, without bleeding                            R13.10, Dysphagia, unspecified CPT copyright 2018 American Medical Association. All rights reserved. The codes documented in this report are preliminary and upon coder review may  be revised to meet current compliance requirements. Mauri Pole, MD 07/22/2018 11:46:19 AM This report has been signed electronically. Number of Addenda: 0

## 2018-07-22 NOTE — Transfer of Care (Signed)
Immediate Anesthesia Transfer of Care Note  Patient: Sherry Lambert  Procedure(s) Performed: ESOPHAGOGASTRODUODENOSCOPY (EGD) WITH PROPOFOL (N/A ) BALLOON DILATION (N/A )  Patient Location: PACU  Anesthesia Type:MAC  Level of Consciousness: sedated  Airway & Oxygen Therapy: Patient Spontanous Breathing and Patient connected to nasal cannula oxygen  Post-op Assessment: Report given to RN and Post -op Vital signs reviewed and stable  Post vital signs: Reviewed and stable  Last Vitals:  Vitals Value Taken Time  BP    Temp    Pulse 79 07/22/2018 11:39 AM  Resp 16 07/22/2018 11:39 AM  SpO2 97 % 07/22/2018 11:39 AM  Vitals shown include unvalidated device data.  Last Pain:  Vitals:   07/22/18 1039  TempSrc: Oral  PainSc: 0-No pain         Complications: No apparent anesthesia complications

## 2018-07-22 NOTE — Interval H&P Note (Signed)
History and Physical Interval Note:  07/22/2018 10:09 AM  Sherry Lambert  has presented today for surgery, with the diagnosis of Esophageal stricture  The various methods of treatment have been discussed with the patient and family. After consideration of risks, benefits and other options for treatment, the patient has consented to  Procedure(s): ESOPHAGOGASTRODUODENOSCOPY (EGD) WITH PROPOFOL (N/A) as a surgical intervention .  The patient's history has been reviewed, patient examined, no change in status, stable for surgery.  I have reviewed the patient's chart and labs.  Questions were answered to the patient's satisfaction.     Elden Brucato

## 2018-07-23 ENCOUNTER — Encounter (HOSPITAL_COMMUNITY): Payer: Self-pay | Admitting: Gastroenterology

## 2018-08-05 ENCOUNTER — Telehealth: Payer: Self-pay | Admitting: Gastroenterology

## 2018-08-05 MED ORDER — ESOMEPRAZOLE MAGNESIUM 40 MG PO PACK: 40.0000 mg | PACK | Freq: Two times a day (BID) | ORAL | 3 refills | Status: DC

## 2018-08-05 NOTE — Telephone Encounter (Signed)
Called patient and informed Nexium packets ssent to [facility]

## 2018-08-05 NOTE — Telephone Encounter (Signed)
Please switch to Nexium powder 40mg  BID X 3 refills. Thanks

## 2018-08-05 NOTE — Telephone Encounter (Signed)
Dr Nandigam Please advise  

## 2018-08-11 ENCOUNTER — Other Ambulatory Visit: Payer: Self-pay

## 2018-08-11 ENCOUNTER — Encounter: Payer: Self-pay | Admitting: Gastroenterology

## 2018-08-11 ENCOUNTER — Telehealth: Payer: Self-pay | Admitting: Gastroenterology

## 2018-08-11 MED ORDER — LANSOPRAZOLE 30 MG PO TBDD: 30.0000 mg | DELAYED_RELEASE_TABLET | Freq: Two times a day (BID) | ORAL | 11 refills | Status: DC

## 2018-08-11 MED ORDER — ESOMEPRAZOLE MAGNESIUM 40 MG PO PACK: 40.0000 mg | PACK | Freq: Two times a day (BID) | ORAL | 3 refills | Status: DC

## 2018-08-11 NOTE — Telephone Encounter (Signed)
Pt called in and stated that the powder prescription that was called in was not covered by ins and do not have the 600.00 to cover.

## 2018-08-11 NOTE — Telephone Encounter (Signed)
Does it have to be a powder or liquid PPI?

## 2018-08-11 NOTE — Telephone Encounter (Signed)
We can do the Prevacid Solutabs 30mg  BID, given the tight esophageal stricture not sure if she is able to get them below EG junction without issues as omeprazole capsules were ineffective.

## 2018-08-11 NOTE — Telephone Encounter (Signed)
Rx to the pharmacy. Patient is aware. Understands a PA may be needed. She says she is in the "dounut hole" with medicare. This makes every Rx be at cost and eliminates copay.

## 2018-08-13 NOTE — Telephone Encounter (Signed)
[employer] tech says PA is needed and was initiated through

## 2018-08-19 NOTE — Telephone Encounter (Signed)
Error

## 2018-08-26 ENCOUNTER — Ambulatory Visit (INDEPENDENT_AMBULATORY_CARE_PROVIDER_SITE_OTHER): Payer: Medicare HMO | Admitting: Internal Medicine

## 2018-08-26 ENCOUNTER — Telehealth: Payer: Self-pay

## 2018-08-26 ENCOUNTER — Encounter: Payer: Self-pay | Admitting: Internal Medicine

## 2018-08-26 VITALS — BP 150/68 | HR 100 | Temp 98.1°F | Ht 59.0 in | Wt 122.0 lb

## 2018-08-26 DIAGNOSIS — J449 Chronic obstructive pulmonary disease, unspecified: Secondary | ICD-10-CM

## 2018-08-26 DIAGNOSIS — I69398 Other sequelae of cerebral infarction: Secondary | ICD-10-CM | POA: Diagnosis not present

## 2018-08-26 DIAGNOSIS — R8290 Unspecified abnormal findings in urine: Secondary | ICD-10-CM

## 2018-08-26 DIAGNOSIS — R209 Unspecified disturbances of skin sensation: Secondary | ICD-10-CM | POA: Diagnosis not present

## 2018-08-26 LAB — POCT URINALYSIS DIPSTICK
BILIRUBIN UA: NEGATIVE
GLUCOSE UA: NEGATIVE
Ketones, UA: NEGATIVE
Nitrite, UA: NEGATIVE
Protein, UA: POSITIVE — AB
Spec Grav, UA: 1.015 (ref 1.010–1.025)
Urobilinogen, UA: 0.2 E.U./dL
pH, UA: 6 (ref 5.0–8.0)

## 2018-08-26 MED ORDER — SULFAMETHOXAZOLE-TRIMETHOPRIM 800-160 MG PO TABS: 1.0000 | ORAL_TABLET | Freq: Two times a day (BID) | ORAL | 0 refills | Status: DC

## 2018-08-26 MED ORDER — FLUTICASONE-UMECLIDIN-VILANT 100-62.5-25 MCG/INH IN AEPB: 1.0000 | INHALATION_SPRAY | Freq: Every day | RESPIRATORY_TRACT | 3 refills | Status: DC

## 2018-08-26 NOTE — Telephone Encounter (Signed)
Before patient left asked if she could switch back to her trelegy instead of albuterol inhaler for COPD. States that it was working for [employer] but now every time she comes in her O2 is lower and lower.

## 2018-08-26 NOTE — Patient Instructions (Signed)
We have sent in the bactrim for the urine infection to take 1 pill twice a day.

## 2018-08-26 NOTE — Telephone Encounter (Signed)
LVM informing patient.

## 2018-08-26 NOTE — Telephone Encounter (Signed)
Sent in for her. We had talked about this during visit

## 2018-08-26 NOTE — Progress Notes (Signed)
   Subjective:   Patient ID: Sherry Lambert, female    DOB: 1941/07/23, 77 y.o.   MRN: [id number]  HPI The patient is a 76 YO female coming in for possible UTI (she has been having some pressure in her low stomach, maybe more urgency to go to bathroom, denies burning with urination, denies fevers or chills, denies back pain new), and concerns about COPD (her current inhalers are not working as well as the trelegy did, she is not getting around as well, denies new sputum or fevers or chills, more SOB and using albuterol more in the last month or two, gradual onset, non-smoker), and her numbness post stroke (she is having some numbness and tingling in her right side still, her balance is worse than before, she is able to walk unassisted now, she denies worsening numbness or it spreading, with the cold she is feeling it more lately, wanted to make sure it was okay).    Review of Systems  Constitutional: Positive for activity change. Negative for appetite change, diaphoresis, fatigue, fever and unexpected weight change.  HENT: Negative.   Eyes: Negative.   Respiratory: Positive for shortness of breath. Negative for cough and chest tightness.   Cardiovascular: Negative.  Negative for chest pain, palpitations and leg swelling.  Gastrointestinal: Negative for abdominal distention, abdominal pain, constipation, diarrhea, nausea and vomiting.  Genitourinary: Positive for dysuria, frequency and urgency.  Musculoskeletal: Negative.   Skin: Negative.   Neurological: Positive for numbness.    Objective:  Physical Exam Constitutional:      Appearance: She is well-developed.  HENT:     Head: Normocephalic and atraumatic.  Neck:     Musculoskeletal: Normal range of motion.  Cardiovascular:     Rate and Rhythm: Normal rate and regular rhythm.  Pulmonary:     Effort: Pulmonary effort is normal. No respiratory distress.     Breath sounds: Wheezing and rhonchi present. No rales.     Comments: Mild  increase in expiratory wheezing.  Abdominal:     General: Bowel sounds are normal. There is no distension.     Palpations: Abdomen is soft.     Tenderness: There is no abdominal tenderness. There is no rebound.  Skin:    General: Skin is warm and dry.  Neurological:     Mental Status: She is alert and oriented to person, place, and time.     Sensory: Sensory deficit present.     Coordination: Coordination normal.     Vitals:   08/26/18 1035  BP: (!) 150/68  Pulse: 100  Temp: 98.1 F (36.7 C)  TempSrc: Oral  SpO2: 92%  Weight: 122 lb (55.3 kg)  Height: 4\' 11"  (1.499 m)    Assessment & Plan:

## 2018-08-28 DIAGNOSIS — R8290 Unspecified abnormal findings in urine: Secondary | ICD-10-CM | POA: Insufficient documentation

## 2018-08-28 NOTE — Assessment & Plan Note (Signed)
Stable neuro exam, she still has deficit right sided from past stroke. No worsening and she continues on aspirin daily.

## 2018-08-28 NOTE — Assessment & Plan Note (Signed)
Rx for trelegy as this was helping her before. She is a non-smoker currently. She is using albuterol prn as well. No indication for steroids or antibiotics today as lung exam is stable from prior.

## 2018-08-28 NOTE — Assessment & Plan Note (Signed)
POC U/A done in the office consistent with infection. Rx for bactrim.

## 2018-09-04 ENCOUNTER — Other Ambulatory Visit: Payer: Self-pay | Admitting: Internal Medicine

## 2018-09-08 ENCOUNTER — Ambulatory Visit: Payer: Self-pay | Admitting: *Deleted

## 2018-09-08 MED ORDER — OSELTAMIVIR PHOSPHATE 75 MG PO CAPS: 75.0000 mg | ORAL_CAPSULE | Freq: Every day | ORAL | 0 refills | Status: DC

## 2018-09-08 NOTE — Telephone Encounter (Signed)
Sent in preventative dosing 1 pill daily for 1 week.

## 2018-09-08 NOTE — Telephone Encounter (Signed)
Patient informed of MD response and stated understanding  

## 2018-09-08 NOTE — Telephone Encounter (Signed)
Pt calling office after being exposed to the flu by her son on last evening. Pt states that her son tested positive for the flu on last night. Pt denies having any symptoms currently. Pt has a history of COPD and DM.  Reason for Disposition . [1] Influenza EXPOSURE  (Close Contact) within last 7 days AND [2] exposed person is HIGH RISK (e.g., age > 71 years, pregnant, HIV+, chronic medical condition) AND [3] NO respiratory symptoms  Answer Assessment - Initial Assessment Questions 1. TYPE of EXPOSURE: "How were you exposed?" (e.g., close contact, not a close contact)     Pt's states she was around her son for about 3-4 minutes last evening and her son was  2. DATE of EXPOSURE: "When did the exposure occur?" (e.g., hour, days, weeks)     Last night, pt tested positive for the flu 3. HIGH RISK for COMPLICATIONS: "Do you have any heart or lung problems? Do you have a weakened immune system?" (e.g., CHF, COPD, asthma, HIV positive, chemotherapy, renal failure, diabetes mellitus, sickle cell anemia)     COPD and DM but does not take anything for it 4. SYMPTOMS: "Do you have any symptoms?" (e.g., cough, fever, sore throat, difficulty breathing).     No symptoms currently  Protocols used: INFLUENZA EXPOSURE-A-AH

## 2018-09-15 ENCOUNTER — Ambulatory Visit (INDEPENDENT_AMBULATORY_CARE_PROVIDER_SITE_OTHER): Payer: Medicare HMO | Admitting: Internal Medicine

## 2018-09-15 ENCOUNTER — Encounter: Payer: Self-pay | Admitting: Internal Medicine

## 2018-09-15 DIAGNOSIS — E118 Type 2 diabetes mellitus with unspecified complications: Secondary | ICD-10-CM | POA: Diagnosis not present

## 2018-09-15 DIAGNOSIS — I10 Essential (primary) hypertension: Secondary | ICD-10-CM | POA: Diagnosis not present

## 2018-09-15 NOTE — Patient Instructions (Signed)
We will just monitor the urine for now and not start any new medicines.

## 2018-09-15 NOTE — Progress Notes (Signed)
   Subjective:   Patient ID: Sherry Lambert, female    DOB: Mar 01, 1941, 78 y.o.   MRN: [id number]  HPI The patient is a 78 YO female coming in for concerns as her insurance company sent her a letter that she has protein in her urine and should be on another medicine. She did used to be on lisinopril/hctz prior to cecal surgery and then had low BP after that. Denies headaches or chest pains at home.   Review of Systems  Constitutional: Negative.   HENT: Negative.   Eyes: Negative.   Respiratory: Negative for cough, chest tightness and shortness of breath.   Cardiovascular: Negative for chest pain, palpitations and leg swelling.  Gastrointestinal: Negative for abdominal distention, abdominal pain, constipation, diarrhea, nausea and vomiting.  Musculoskeletal: Negative.   Skin: Negative.   Neurological: Negative.   Psychiatric/Behavioral: Negative.     Objective:  Physical Exam Constitutional:      Appearance: She is well-developed.  HENT:     Head: Normocephalic and atraumatic.  Neck:     Musculoskeletal: Normal range of motion.  Cardiovascular:     Rate and Rhythm: Normal rate and regular rhythm.  Pulmonary:     Effort: Pulmonary effort is normal. No respiratory distress.     Breath sounds: Normal breath sounds. No wheezing or rales.  Abdominal:     General: Bowel sounds are normal. There is no distension.     Palpations: Abdomen is soft.     Tenderness: There is no abdominal tenderness. There is no rebound.  Skin:    General: Skin is warm and dry.  Neurological:     Mental Status: She is alert and oriented to person, place, and time.     Coordination: Coordination normal.     Vitals:   09/15/18 1547  BP: 130/60  Pulse: 81  Temp: 98.1 F (36.7 C)  TempSrc: Oral  SpO2: 95%  Weight: 117 lb (53.1 kg)  Height: 4\' 11"  (1.499 m)    Assessment & Plan:  Visit time 25 minutes: greater than 50% of that time was spent in face to face counseling and coordination of care  with the patient: counseled about the nature and quantity of protein in her urine and risk/benefit of starting ACE-I versus observation of quantity of protein in urine.

## 2018-09-16 NOTE — Assessment & Plan Note (Signed)
BP at goal and if more medication needed in the future will add lisinopril back.

## 2018-09-16 NOTE — Assessment & Plan Note (Signed)
At times she was having dizziness on former meds. We discussed option of low dose 5 mg lisinopril versus monitoring level of protein in urine and they elect to do that at this time.

## 2018-09-22 ENCOUNTER — Telehealth: Payer: Self-pay | Admitting: Hematology

## 2018-09-22 NOTE — Telephone Encounter (Signed)
Called patient in regards to 01/16 VM log to reschedule appt.  Patient did not answer, I spoke to the son, but the son did not know what day to reschedule the appt for.

## 2018-09-25 ENCOUNTER — Other Ambulatory Visit: Payer: Self-pay

## 2018-09-25 DIAGNOSIS — C180 Malignant neoplasm of cecum: Secondary | ICD-10-CM

## 2018-09-26 ENCOUNTER — Inpatient Hospital Stay: Payer: Medicare HMO | Attending: Hematology

## 2018-09-26 ENCOUNTER — Inpatient Hospital Stay: Payer: Medicare HMO | Admitting: Hematology

## 2018-10-01 DIAGNOSIS — I69351 Hemiplegia and hemiparesis following cerebral infarction affecting right dominant side: Secondary | ICD-10-CM | POA: Diagnosis not present

## 2018-10-01 DIAGNOSIS — H04129 Dry eye syndrome of unspecified lacrimal gland: Secondary | ICD-10-CM | POA: Diagnosis not present

## 2018-10-01 DIAGNOSIS — G629 Polyneuropathy, unspecified: Secondary | ICD-10-CM | POA: Diagnosis not present

## 2018-10-01 DIAGNOSIS — K219 Gastro-esophageal reflux disease without esophagitis: Secondary | ICD-10-CM | POA: Diagnosis not present

## 2018-10-01 DIAGNOSIS — Z7722 Contact with and (suspected) exposure to environmental tobacco smoke (acute) (chronic): Secondary | ICD-10-CM | POA: Diagnosis not present

## 2018-10-01 DIAGNOSIS — J449 Chronic obstructive pulmonary disease, unspecified: Secondary | ICD-10-CM | POA: Diagnosis not present

## 2018-10-01 DIAGNOSIS — E785 Hyperlipidemia, unspecified: Secondary | ICD-10-CM | POA: Diagnosis not present

## 2018-10-01 DIAGNOSIS — R030 Elevated blood-pressure reading, without diagnosis of hypertension: Secondary | ICD-10-CM | POA: Diagnosis not present

## 2018-10-01 DIAGNOSIS — G8929 Other chronic pain: Secondary | ICD-10-CM | POA: Diagnosis not present

## 2018-10-01 DIAGNOSIS — R32 Unspecified urinary incontinence: Secondary | ICD-10-CM | POA: Diagnosis not present

## 2018-10-02 ENCOUNTER — Ambulatory Visit: Payer: Self-pay | Admitting: *Deleted

## 2018-10-02 ENCOUNTER — Telehealth: Payer: Self-pay | Admitting: *Deleted

## 2018-10-02 ENCOUNTER — Other Ambulatory Visit (INDEPENDENT_AMBULATORY_CARE_PROVIDER_SITE_OTHER): Payer: Medicare HMO

## 2018-10-02 ENCOUNTER — Other Ambulatory Visit: Payer: Self-pay

## 2018-10-02 ENCOUNTER — Ambulatory Visit (INDEPENDENT_AMBULATORY_CARE_PROVIDER_SITE_OTHER): Payer: Medicare HMO | Admitting: Nurse Practitioner

## 2018-10-02 ENCOUNTER — Encounter: Payer: Self-pay | Admitting: Nurse Practitioner

## 2018-10-02 VITALS — BP 130/72 | HR 106 | Temp 99.5°F | Ht 59.0 in | Wt 121.0 lb

## 2018-10-02 DIAGNOSIS — R110 Nausea: Secondary | ICD-10-CM

## 2018-10-02 DIAGNOSIS — R5381 Other malaise: Secondary | ICD-10-CM

## 2018-10-02 DIAGNOSIS — R509 Fever, unspecified: Secondary | ICD-10-CM

## 2018-10-02 LAB — COMPREHENSIVE METABOLIC PANEL
ALBUMIN: 4.1 g/dL (ref 3.5–5.2)
ALT: 17 U/L (ref 0–35)
AST: 20 U/L (ref 0–37)
Alkaline Phosphatase: 75 U/L (ref 39–117)
BUN: 19 mg/dL (ref 6–23)
CALCIUM: 9.8 mg/dL (ref 8.4–10.5)
CHLORIDE: 99 meq/L (ref 96–112)
CO2: 26 mEq/L (ref 19–32)
CREATININE: 0.99 mg/dL (ref 0.40–1.20)
GFR: 54.49 mL/min — AB (ref >60.00)
Glucose, Bld: 116 mg/dL — ABNORMAL HIGH (ref 70–99)
POTASSIUM: 4.5 meq/L (ref 3.5–5.1)
Sodium: 136 mEq/L (ref 135–145)
Total Bilirubin: 0.5 mg/dL (ref 0.2–1.2)
Total Protein: 6.8 g/dL (ref 6.0–8.3)

## 2018-10-02 LAB — URINALYSIS, ROUTINE W REFLEX MICROSCOPIC
BILIRUBIN URINE: NEGATIVE
KETONES UR: NEGATIVE
NITRITE: NEGATIVE
SPECIFIC GRAVITY, URINE: 1.015 (ref 1.000–1.030)
Total Protein, Urine: NEGATIVE
URINE GLUCOSE: NEGATIVE
Urobilinogen, UA: 0.2 (ref 0.0–1.0)
pH: 6 (ref 5.0–8.0)

## 2018-10-02 LAB — CBC
HCT: 44.4 % (ref 36.0–46.0)
Hemoglobin: 14.2 g/dL (ref 12.0–15.0)
MCHC: 32.1 g/dL (ref 30.0–36.0)
MCV: 89.8 fl (ref 78.0–100.0)
Platelets: 239 10*3/uL (ref 150.0–400.0)
RBC: 4.95 Mil/uL (ref 3.87–5.11)
RDW: 14.1 % (ref 11.5–15.5)

## 2018-10-02 LAB — POC INFLUENZA A&B (BINAX/QUICKVUE)
INFLUENZA A, POC: NEGATIVE
Influenza B, POC: NEGATIVE

## 2018-10-02 NOTE — Patient Instructions (Signed)
Head downstairs for labs today  I will let you know when I get your labs back   Fever, Adult     A fever is an increase in your body's temperature. It often means a temperature of 100.21F (38C) or higher. Brief mild or moderate fevers often have no long-term effects. They often do not need treatment. Moderate or high fevers may make you feel uncomfortable. Sometimes, they can be a sign of a serious illness or disease. A fever that keeps coming back or that lasts a long time may cause you to lose water in your body (get dehydrated). You can take your temperature with a thermometer to see if you have a fever. Temperature can change with:  Age.  Time of day.  Where the thermometer is put in the body. Readings may vary when the thermometer is put: ? In the mouth (oral). ? In the butt (rectal). ? In the ear (tympanic). ? Under the arm (axillary). ? On the forehead (temporal). Follow these instructions at home: Medicines  Take over-the-counter and prescription medicines only as told by your doctor. Follow the dosing instructions carefully.  If you were prescribed an antibiotic medicine, take it as told by your doctor. Do not stop taking it even if you start to feel better. General instructions  Watch for any changes in your symptoms. Tell your doctor about them.  Rest as needed.  Drink enough fluid to keep your pee (urine) pale yellow.  Sponge yourself or bathe with room-temperature water as needed. This helps to lower your body temperature. Do not use ice water.  Do not use too many blankets or wear clothes that are too heavy.  If your fever was caused by an infection that spreads from person to person (is contagious), such as a cold or the flu: ? You should stay home from work and public places for at least 24 hours after your fever is gone. ? Your fever should be gone for at least 24 hours without the need to use medicines. Contact a doctor if:  You throw up (vomit).  You  cannot eat or drink without throwing up.  You have watery poop (diarrhea).  It hurts when you pee.  Your symptoms do not get better with treatment.  You have new symptoms.  You feel very weak. Get help right away if:  You are short of breath or have trouble breathing.  You are dizzy or you pass out (faint).  You feel mixed up (confused).  You have signs of not having enough water in your body, such as: ? Dark pee, very little pee, or no pee. ? Cracked lips. ? Dry mouth. ? Sunken eyes. ? Sleepiness. ? Weakness.  You have very bad pain in your belly (abdomen).  You keep throwing up or having watery poop.  You have a rash on your skin.  Your symptoms get worse all of a sudden. Summary  A fever is an increase in your body's temperature. It often means a temperature of 100.21F (38C) or higher.  Watch for any changes in your symptoms. Tell your doctor about them.  Take all medicines only as told by your doctor.  Do not go to work or other public places if your fever was caused by an illness that can spread to other people.  Get help right away if you have signs that you do not have enough water in your body. This information is not intended to replace advice given to you by your  health care provider. Make sure you discuss any questions you have with your health care provider. Document Released: 05/29/2008 Document Revised: 02/03/2018 Document Reviewed: 02/03/2018 Elsevier Interactive Patient Education  2019 [employer].

## 2018-10-02 NOTE — Telephone Encounter (Signed)
Patient's WBC count is critical at 26.6.

## 2018-10-02 NOTE — Telephone Encounter (Signed)
Patient's daughter in law is calling with concerns about patient. Patient is having chills and pain.Daughter is not with patient and request appointment made to check her out. Call to patient: Patient got up last night to go to bathroom and felt dizzy with chills. She got back in the bed and today she is not feeling well. She reports she has pain on R side- she has had regular BM and urinated normally without problem. Patient has no chest pain, no problems breathing , no abnormal swelling. Appointment has been made for patient- she is to call back if she feels worse in any way.  Answer Assessment - Initial Assessment Questions 1. NAUSEA SEVERITY: "How bad is the nausea?" (e.g., mild, moderate, severe; dehydration, weight loss)   - MILD: loss of appetite without change in eating habits   - MODERATE: decreased oral intake without significant weight loss, dehydration, or malnutrition   - SEVERE: inadequate caloric or fluid intake, significant weight loss, symptoms of dehydration     mild 2. ONSET: "When did the nausea begin?"     This morning 3. VOMITING: "Any vomiting?" If so, ask: "How many times today?"     no 4. RECURRENT SYMPTOM: "Have you had nausea before?" If so, ask: "When was the last time?" "What happened that time?"     This morning it started 5. CAUSE: "What do you think is causing the nausea?"     Not sure- fatigue and chills 6. PREGNANCY: "Is there any chance you are pregnant?" (e.g., unprotected intercourse, missed birth control pill, broken condom)     n/a  Protocols used: NAUSEA-A-AH

## 2018-10-02 NOTE — Telephone Encounter (Signed)
Response sent in result note

## 2018-10-02 NOTE — Progress Notes (Signed)
Sherry Lambert is a 78 y.o. female with the following history as recorded in Epic[employer]:  Patient Active Problem List   Diagnosis Date Noted  . Abnormal urine odor 08/28/2018  . Esophageal dysphagia   . Venous insufficiency 11/29/2017  . Urinary incontinence 11/29/2017  . 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 Preoperative clearance 06/03/2017  . Cecum mass   . Mass in rectum   . PVD (peripheral vascular disease) ([place]) 09/14/2016  . COPD (chronic obstructive pulmonary disease) ([place]) 12/01/2015  . 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 Essential hypertension 12/20/2014  . Intracerebral hemorrhage ([place]) 10/26/2014  . Routine general medical examination at a health care facility 10/22/2014  . 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 Peripheral arterial disease ([place]) 01/05/2014  . Osteopenia 11/12/2013  . Hyperlipidemia associated with type 2 diabetes mellitus ([place]) 09/19/2013  . Esophageal stricture 12/23/2012  . Diabetes mellitus type 2, controlled, 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[place]) 93/81/0175  . Other emphysema ([place]) 11/10/2012    Current Outpatient Medications  Medication Sig Dispense Refill  . acetaminophen (TYLENOL) 500 MG tablet Take 500 mg by mouth daily.    Marland Kitchen albuterol (PROVENTIL HFA;VENTOLIN HFA) 108 (90 Base) MCG/ACT inhaler INHALE 2 PUFFS BY MOUTH INTO THE LUNGS EVERY 6 HOURS AS NEEDED FOR WHEEZING OR FOR [name] 18 each 0  . aspirin EC 81 MG tablet Take 1 tablet (81 mg total) by mouth daily. 90 tablet 3  . Carboxymethylcellul-Glycerin (LUBRICATING EYE DROPS OP) Place 1 drop into both eyes daily as needed (dry eyes).    . Cholecalciferol (VITAMIN D3) 125 MCG (5000 UT) CAPS Take 5,000 Units by mouth daily.    . diphenhydrAMINE (BENADRYL) 25 MG tablet Take 25 mg by mouth daily as needed for allergies.    . Doxylamine Succinate, Sleep, (SLEEP AID PO) Take 1 tablet by  mouth at bedtime as needed (sleep).    Marland Kitchen esomeprazole (NEXIUM) 40 MG packet Take 40 mg by mouth 2 (two) times daily. 60 each 3  . Fluticasone-Umeclidin-Vilant (TRELEGY ELLIPTA) 100-62.5-25 MCG/INH AEPB Inhale 1 puff into the lungs daily. 90 each 3  . gabapentin (NEURONTIN) 300 MG capsule TAKE 1 CAPSULE BY MOUTH THREE TIMES DAILY 90 capsule 3  . pravastatin (PRAVACHOL) 20 MG tablet TAKE 1 TABLET BY MOUTH ONCE DAILY 90 tablet 3  . traMADol (ULTRAM) 50 MG tablet Take 1/2- 1 tablet twice a day as needed for pain (Patient taking differently: Take 50 mg by mouth daily as needed for moderate pain. ) 30 tablet 0  . lansoprazole (PREVACID SOLUTAB) 30 MG disintegrating tablet Take 1 tablet (30 mg total) by mouth 2 (two) times daily. (Patient not taking: Reported on 10/02/2018) 60 tablet 11   No current facility-administered medications for this visit.     Allergies: Lipitor [atorvastatin]  Past Medical History:  Diagnosis Date  . 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 [name]   . Cataract   . Chronic kidney disease    kidney stone  . Colitis   . Colon cancer ([place]) 06/2017   cecum  . Diabetes mellitus without complication ([place])    no meds taken  . Emphysema of lung ([place])   . GERD (gastroesophageal reflux disease)   . Hyperlipidemia   . Hypertension   . Peripheral arterial disease ([place])   . [name]   . Stroke [facility])    November 2016- Thanksgiving night  Past Surgical History:  Procedure Laterality Date  . ABDOMINAL HYSTERECTOMY    . BALLOON DILATION N/A 12/23/2012   Procedure: BALLOON DILATION;  Surgeon: Inda Castle, MD;  Location: [facility];  Service: Endoscopy;  Laterality: N/A;  . BALLOON DILATION N/A 01/15/2013   Procedure: BALLOON DILATION;  Surgeon: Inda Castle, MD;  Location: [facility] ENDOS[facility];  Service: Endoscopy;  Laterality: N/A;  . BALLOON DILATION N/A 07/22/2018   Procedure: BALLOON DILATION;  Surgeon: Mauri Pole, MD;  Location: [facility]  ENDOSCOPY;  Service: Endoscopy;  Laterality: N/A;  . cataract     both eyes  . COLONOSCOPY    . COLONOSCOPY WITH PROPOFOL N/A 03/05/2017   Procedure: COLONOSCOPY WITH PROPOFOL;  Surgeon: Mauri Pole, MD;  Location: [facility] ENDOSCOPY;  Service: Endoscopy;  Laterality: N/A;  . DENTAL RESTORATION/EXTRACTION WITH X-RAY    . ESOPHAGOGASTRODUODENOSCOPY N/A 12/23/2012   Procedure: ESOPHAGOGASTRODUODENOSCOPY (EGD);  Surgeon: Inda Castle, MD;  Location: [facility];  Service: Endoscopy;  Laterality: N/A;  . ESOPHAGOGASTRODUODENOSCOPY N/A 01/15/2013   Procedure: ESOPHAGOGASTRODUODENOSCOPY (EGD);  Surgeon: Inda Castle, MD;  Location: [facility];  Service: Endoscopy;  Laterality: N/A;  . ESOPHAGOGASTRODUODENOSCOPY (EGD) WITH PROPOFOL N/A 07/22/2018   Procedure: ESOPHAGOGASTRODUODENOSCOPY (EGD) WITH PROPOFOL;  Surgeon: Mauri Pole, MD;  Location: [facility] ENDOS[facility];  Service: Endoscopy;  Laterality: N/A;  . TONSILLECTOMY      Family History  Problem Relation Age of Onset  . Hypertension Father   . Diabetes Father   . Cancer Father        melanoma  . Colon cancer Sister 41  . Cancer Sister        colon, surgery alone   . Early death Neg Hx   . Stroke Neg Hx   . Esophageal cancer Neg Hx   . Rectal cancer Neg Hx   . Stomach cancer Neg Hx     Social History   Tobacco Use  . Smoking status: Former Smoker    Packs/day: 1.00    Years: 55.00    Pack years: 55.00    Types: Cigarettes    Last attempt to quit: 10/19/2002    Years since quitting: 15.9  . Smokeless tobacco: Never Used  Substance Use Topics  . Alcohol use: No    Alcohol/week: 0.0 standard drinks     Subjective:  Sherry Lambert is here today for acute visit. She says she woke this morning and felt chills, "shivers", fatigue, nausea, "like she was getting sick." She then took OTC tylenol and her symptoms improved, actually feeling better now. She is concerned due to son having flu about 3 weeks ago, she was treated with  tamiflu course at that time and never developed any flu symptoms. She did notice some mild pelvic pain/pressure yesterday which resolved. She was treated for UTI with bactrim in December, reports urinary symptoms did resolve after bactrim course.  Review of Systems  Constitutional: Positive for fever and malaise/fatigue.  Respiratory: Negative for cough and shortness of breath.   Cardiovascular: Negative for chest pain.  Gastrointestinal: Positive for nausea. Negative for constipation, diarrhea and vomiting.  Genitourinary: Negative for dysuria, flank pain, frequency, hematuria and urgency.  Musculoskeletal: Negative for back pain, falls and myalgias.  Skin: Negative for rash.  Neurological: Negative for loss of consciousness and headaches.   Objective:  Vitals:   10/02/18 1428  BP: 130/72  Pulse: (!) 106  Temp: 99.5 F (37.5 C)  TempSrc: Oral  SpO2: 94%  Weight: 121 lb (54.9 kg)  Height: 4\' 11"  (1.499 m)    General: Well developed, well nourished, in no acute distress, amb with walker  Skin : Warm and dry.  Head: Normocephalic and atraumatic  Eyes: Sclera and conjunctiva clear; pupils round and reactive to light; extraocular movements intact  Ears: External normal; canals clear; tympanic membranes normal  Oropharynx: Pink, supple. No suspicious lesions  Neck: Supple without thyromegaly, adenopathy  Lungs: Respirations unlabored; clear to auscultation bilaterally without wheeze, rales, rhonchi  CVS exam: normal rate and regular rhythm.  Abdomen: Soft; nontender; nondistended; normoactive bowel sounds; no masses or hepatosplenomegaly; no cva tenderness  Musculoskeletal: No deformities; no active joint inflammation  Extremities: No edema, cyanosis, clubbing  Vessels: Symmetric bilaterally  Neurologic: Alert and oriented; speech intact; face symmetrical; moves all extremities well; CNII-XII intact without focal deficit  Psychiatric: Normal mood and affect.   Assessment:  1.  Fever, unspecified fever cause   2. Malaise   3. Nausea     Plan:   Low grade fever noted, otherwise normal PE POCT flu negative Additional labs ordered for further evaluation  Home management, red flags and return precautions including when to seek immediate care discussed and printed on AVS F/U with further recommendations pending lab results  No follow-ups on file.  Orders Placed This Encounter  Procedures  . CULTURE, URINE COMPREHENSIVE    Standing Status:   Future    Standing Expiration Date:   11/01/2018  . CBC    Standing Status:   Future    Standing Expiration Date:   10/03/2019  . Urinalysis, Routine w reflex microscopic    Standing Status:   Future    Standing Expiration Date:   10/02/2019  . Comprehensive metabolic panel    Standing Status:   Future    Standing Expiration Date:   10/03/2019  . POC Influenza A&B (Binax test)    Requested Prescriptions    No prescriptions requested or ordered in this encounter

## 2018-10-03 ENCOUNTER — Ambulatory Visit: Payer: Self-pay

## 2018-10-03 DIAGNOSIS — R05 Cough: Secondary | ICD-10-CM

## 2018-10-03 DIAGNOSIS — R509 Fever, unspecified: Secondary | ICD-10-CM

## 2018-10-03 DIAGNOSIS — R059 Cough, unspecified: Secondary | ICD-10-CM

## 2018-10-03 NOTE — Telephone Encounter (Signed)
I have ordered stat chest xray to check for possible infection in her chest, please have her stop by xray department downstairs today for this chest xray

## 2018-10-03 NOTE — Telephone Encounter (Signed)
Patient called and says she blew her nose this morning and it was yellow that came out. She says she got it up out of her throat, no coughing, and it was a blob of yellow as well. She's wondering if she needs an antibiotic. She says it wasn't happening when she came to the office yesterday, it just happened this morning. I advised I will send to [facility] since she was seen by her yesterday and someone will call with her recommendation, patient verbalized understanding.

## 2018-10-03 NOTE — Telephone Encounter (Signed)
Pt informed of below. She states she can not get here today to have CXR. She states she has some generic Mucinex at home she is going to try.

## 2018-10-04 LAB — CULTURE, URINE COMPREHENSIVE
MICRO NUMBER: 128080
RESULT: NO GROWTH
SPECIMEN QUALITY:: ADEQUATE

## 2018-10-09 ENCOUNTER — Ambulatory Visit: Payer: Medicare HMO | Admitting: Internal Medicine

## 2018-10-13 ENCOUNTER — Ambulatory Visit: Payer: Medicare HMO | Admitting: Internal Medicine

## 2018-10-14 ENCOUNTER — Ambulatory Visit (INDEPENDENT_AMBULATORY_CARE_PROVIDER_SITE_OTHER): Payer: Medicare HMO | Admitting: Internal Medicine

## 2018-10-14 ENCOUNTER — Other Ambulatory Visit (INDEPENDENT_AMBULATORY_CARE_PROVIDER_SITE_OTHER): Payer: Medicare HMO

## 2018-10-14 ENCOUNTER — Encounter: Payer: Self-pay | Admitting: Internal Medicine

## 2018-10-14 VITALS — BP 138/80 | HR 81 | Temp 98.4°F | Ht 59.0 in | Wt 121.0 lb

## 2018-10-14 DIAGNOSIS — D72829 Elevated white blood cell count, unspecified: Secondary | ICD-10-CM | POA: Insufficient documentation

## 2018-10-14 LAB — CBC WITH DIFFERENTIAL/PLATELET
BASOS ABS: 0 10*3/uL (ref 0.0–0.1)
Basophils Relative: 0.2 % (ref 0.0–3.0)
EOS ABS: 0.1 10*3/uL (ref 0.0–0.7)
Eosinophils Relative: 0.5 % (ref 0.0–5.0)
HCT: 43.4 % (ref 36.0–46.0)
Hemoglobin: 14.2 g/dL (ref 12.0–15.0)
LYMPHS ABS: 2 10*3/uL (ref 0.7–4.0)
Lymphocytes Relative: 18.3 % (ref 12.0–46.0)
MCHC: 32.6 g/dL (ref 30.0–36.0)
MCV: 88.9 fl (ref 78.0–100.0)
MONO ABS: 0.7 10*3/uL (ref 0.1–1.0)
Monocytes Relative: 6.1 % (ref 3.0–12.0)
NEUTROS PCT: 74.9 % (ref 43.0–77.0)
Neutro Abs: 8.3 10*3/uL — ABNORMAL HIGH (ref 1.4–7.7)
PLATELETS: 308 10*3/uL (ref 150.0–400.0)
RBC: 4.88 Mil/uL (ref 3.87–5.11)
RDW: 13.9 % (ref 11.5–15.5)
WBC: 11.1 10*3/uL — ABNORMAL HIGH (ref 4.0–10.5)

## 2018-10-14 NOTE — Progress Notes (Signed)
   Subjective:   Patient ID: Sherry Lambert, female    DOB: 02-02-1941, 78 y.o.   MRN: [id number]  HPI The patient is a 78 YO female coming in for follow up of high white count. She was sick about 1-2 weeks ago with fevers and WBC 26. She was flu negative at the time and no urine infection (culture done). She is not having fevers any longer and feels better overall. Still having some chronic joint pains. Denies chest pains, SOB, night sweats. Denies diarrhea or constipation.   Review of Systems  Constitutional: Negative.   HENT: Negative.   Eyes: Negative.   Respiratory: Negative for cough, chest tightness and shortness of breath.   Cardiovascular: Negative for chest pain, palpitations and leg swelling.  Gastrointestinal: Negative for abdominal distention, abdominal pain, constipation, diarrhea, nausea and vomiting.  Musculoskeletal: Positive for arthralgias and myalgias.  Skin: Negative.   Neurological: Negative.   Psychiatric/Behavioral: Negative.     Objective:  Physical Exam Constitutional:      Appearance: She is well-developed.  HENT:     Head: Normocephalic and atraumatic.  Neck:     Musculoskeletal: Normal range of motion.  Cardiovascular:     Rate and Rhythm: Normal rate and regular rhythm.  Pulmonary:     Effort: Pulmonary effort is normal. No respiratory distress.     Breath sounds: Normal breath sounds. No wheezing or rales.  Abdominal:     General: Bowel sounds are normal. There is no distension.     Palpations: Abdomen is soft.     Tenderness: There is no abdominal tenderness. There is no rebound.  Skin:    General: Skin is warm and dry.  Neurological:     Mental Status: She is alert and oriented to person, place, and time.     Coordination: Coordination normal.     Vitals:   10/14/18 1541  BP: 138/80  Pulse: 81  Temp: 98.4 F (36.9 C)  TempSrc: Oral  SpO2: 94%  Weight: 121 lb (54.9 kg)  Height: 4\' 11"  (1.499 m)    Assessment & Plan:

## 2018-10-14 NOTE — Assessment & Plan Note (Signed)
Checking CBC with diff today and treat as appropriate.

## 2018-10-14 NOTE — Patient Instructions (Addendum)
We are checking the blood work today.   Plantar Fasciitis Rehab Ask your health care provider which exercises are safe for you. Do exercises exactly as told by your health care provider and adjust them as directed. It is normal to feel mild stretching, pulling, tightness, or discomfort as you do these exercises, but you should stop right away if you feel sudden pain or your pain gets worse. Do not begin these exercises until told by your health care provider. Stretching and range of motion exercises These exercises warm up your muscles and joints and improve the movement and flexibility of your foot. These exercises also help to relieve pain. Exercise A: Plantar fascia stretch  1. Sit with your left / right leg crossed over your opposite knee. 2. Hold your heel with one hand with that thumb near your arch. With your other hand, hold your toes and gently pull them back toward the top of your foot. You should feel a stretch on the bottom of your toes or your foot or both. 3. Hold this stretch for__________ seconds. 4. Slowly release your toes and return to the starting position. Repeat __________ times. Complete this exercise __________ times a day. Exercise B: Gastroc, standing  1. Stand with your hands against a wall. 2. Extend your left / right leg behind you, and bend your front knee slightly. 3. Keeping your heels on the floor and keeping your back knee straight, shift your weight toward the wall without arching your back. You should feel a gentle stretch in your left / right calf. 4. Hold this position for __________ seconds. Repeat __________ times. Complete this exercise __________ times a day. Exercise C: Soleus, standing 1. Stand with your hands against a wall. 2. Extend your left / right leg behind you, and bend your front knee slightly. 3. Keeping your heels on the floor, bend your back knee and slightly shift your weight over the back leg. You should feel a gentle stretch deep in  your calf. 4. Hold this position for __________ seconds. Repeat __________ times. Complete this exercise __________ times a day. Exercise D: Gastrocsoleus, standing 1. Stand with the ball of your left / right foot on a step. The ball of your foot is on the walking surface, right under your toes. 2. Keep your other foot firmly on the same step. 3. Hold onto the wall or a railing for balance. 4. Slowly lift your other foot, allowing your body weight to press your heel down over the edge of the step. You should feel a stretch in your left / right calf. 5. Hold this position for __________ seconds. 6. Return both feet to the step. 7. Repeat this exercise with a slight bend in your left / right knee. Repeat __________ times with your left / right knee straight and __________ times with your left / right knee bent. Complete this exercise __________ times a day. Balance exercise This exercise builds your balance and strength control of your arch to help take pressure off your plantar fascia. Exercise E: Single leg stand 1. Without shoes, stand near a railing or in a doorway. You may hold onto the railing or door frame as needed. 2. Stand on your left / right foot. Keep your big toe down on the floor and try to keep your arch lifted. Do not let your foot roll inward. 3. Hold this position for __________ seconds. 4. If this exercise is too easy, you can try it with your eyes closed or  while standing on a pillow. Repeat __________ times. Complete this exercise __________ times a day. This information is not intended to replace advice given to you by your health care provider. Make sure you discuss any questions you have with your health care provider. Document Released: 08/20/2005 Document Revised: 04/24/2016 Document Reviewed: 07/04/2015 Elsevier Interactive Patient Education  2019 [employer].

## 2018-10-24 ENCOUNTER — Other Ambulatory Visit: Payer: Self-pay | Admitting: Internal Medicine

## 2018-11-05 ENCOUNTER — Telehealth: Payer: Self-pay | Admitting: Gastroenterology

## 2018-11-05 NOTE — Telephone Encounter (Signed)
Spoke with the patient. She picked up her Omeprazole and read the warnings and side effects. She is not having any problems or concerns. Declines to make a follow up appointment. Advised her per the procedure note she will be on the Omeprazole indefinitely.

## 2018-11-05 NOTE — Telephone Encounter (Signed)
Pt would like to know how long she needs to take omeprazole.

## 2018-11-20 ENCOUNTER — Other Ambulatory Visit: Payer: Self-pay | Admitting: Internal Medicine

## 2018-11-20 MED ORDER — TRAMADOL HCL 50 MG PO TABS: 50.0000 mg | ORAL_TABLET | Freq: Every day | ORAL | 0 refills | Status: DC | PRN

## 2018-11-20 NOTE — Telephone Encounter (Signed)
Copied from [place] [phone]. Topic: Quick Communication - Rx Refill/Question >> Nov 20, 2018 12:56 PM Percell Belt A wrote: Medication: traMADol (ULTRAM) 50 MG tablet [[id number]] -   Has the patient contacted their pharmacy? No. (Agent: If no, request that the patient contact the pharmacy for the refill.) (Agent: If yes, when and what did the pharmacy advise?) [place], [place] [phone] (Phone)  Preferred Pharmacy (with phone number or street name):   Agent: Please be advised that RX refills may take up to 3 business days. We ask that you follow-up with your pharmacy.

## 2018-11-20 NOTE — Telephone Encounter (Signed)
Control database checked last refill: 04/22/2018 LOV: F/U 10/14/2018, CPE 06/05/2018 NOV: none

## 2018-12-19 ENCOUNTER — Ambulatory Visit: Payer: Self-pay

## 2018-12-19 ENCOUNTER — Encounter: Payer: Self-pay | Admitting: Internal Medicine

## 2018-12-19 ENCOUNTER — Telehealth: Payer: Self-pay | Admitting: Internal Medicine

## 2018-12-19 ENCOUNTER — Ambulatory Visit (INDEPENDENT_AMBULATORY_CARE_PROVIDER_SITE_OTHER): Payer: Medicare HMO | Admitting: Internal Medicine

## 2018-12-19 DIAGNOSIS — J441 Chronic obstructive pulmonary disease with (acute) exacerbation: Secondary | ICD-10-CM

## 2018-12-19 MED ORDER — DOXYCYCLINE HYCLATE 100 MG PO TABS: 100.0000 mg | ORAL_TABLET | Freq: Two times a day (BID) | ORAL | 0 refills | Status: DC

## 2018-12-19 MED ORDER — PROMETHAZINE-DM 6.25-15 MG/5ML PO SYRP: 5.0000 mL | ORAL_SOLUTION | Freq: Two times a day (BID) | ORAL | 0 refills | Status: DC | PRN

## 2018-12-19 NOTE — Telephone Encounter (Signed)
Duplicate telephone encounter

## 2018-12-19 NOTE — Progress Notes (Signed)
Virtual Visit via Video Note  I connected with Carollee Massed on 12/19/18 at  2:00 PM EDT by a video enabled telemedicine application and verified that I am speaking with the correct person using two identifiers.   I discussed the limitations of evaluation and management by telemedicine and the availability of in person appointments. The patient expressed understanding and agreed to proceed.  History of Present Illness: The patient is a 78 y.o. female with visit for possible cold symptoms. Having chills earlier this week and headaches. Could not take temperature. Coughing more than usual. Some SOB but it is mildly better today. Coughing up yellow sputum. Started 4-5 days ago. Has not been outside in about 3-4 weeks and not going anywhere. Denies coughing up blood. Overall it is improving slightly today but otherwise stable. Has tried trelegy and mucinex without much relief. Tylenol for headache which helped.   Observations/Objective: Appearance: normal, breathing appears at baseline, casual grooming, abdomen does not appear distended, throat not examined, mental status is A and O times 3  Assessment and Plan: See problem oriented charting  Follow Up Instructions: Rx for doxycycline  I discussed the assessment and treatment plan with the patient. The patient was provided an opportunity to ask questions and all were answered. The patient agreed with the plan and demonstrated an understanding of the instructions.   The patient was advised to call back or seek an in-person evaluation if the symptoms worsen or if the condition fails to improve as anticipated.  [name], MD

## 2018-12-19 NOTE — Telephone Encounter (Signed)
Incoming call from Patient with a complaint of chest congestion.  For 3 days first day had chills  Unable to take temperature, r/t broken thermometer.  Patient states that she is bringing up yellow mucous.  Patient states she felt achy on Tuesday.  Denies SOB.  Reports she  has COPD, and runny nose.  Reviewed protocol . Transferred [place] to [facility] practice to schedule Virtual visit.   Reason for Disposition . [1] Known COPD or other severe lung disease (i.e., bronchiectasis, cystic fibrosis, lung surgery) AND [2] worsening symptoms (i.e., increased sputum purulence or amount, increased breathing difficulty  Answer Assessment - Initial Assessment Questions 1. ONSET: "When did the cough begin?"     tueday morning 2. SEVERITY: "How bad is the cough today?"      Not worse 3. RESPIRATORY DISTRESS: "Describe your breathing."      denies 4. FEVER: "Do you have a fever?" If so, ask: "What is your temperature, how was it measured, and when did it start?"     Denies,  thermeter broke 5. SPUTUM: "Describe the color of your sputum" (clear, white, yellow, green)     Yellow mucous 6. HEMOPTYSIS: "Are you coughing up any blood?" If so ask: "How much?" (flecks, streaks, tablespoons, etc.)     denies 7. CARDIAC HISTORY: "Do you have any history of heart disease?" (e.g., heart attack, congestive heart failure)      denies 8. LUNG HISTORY: "Do you have any history of lung disease?"  (e.g., pulmonary embolus, asthma, emphysema)   COPD 9. PE RISK FACTORS: "Do you have a history of blood clots?" (or: recent major surgery, recent prolonged travel, bedridden)      10. OTHER SYMPTOMS: "Do you have any other symptoms?" (e.g., runny nose, wheezing, chest pain)       Runny nose.   11. PREGNANCY: "Is there any chance you are pregnant?" "When was your last menstrual period?"        12. TRAVEL: "Have you traveled out of the country in the last month?" (e.g., travel history, exposures)       denies  Protocols used:  [place]

## 2018-12-19 NOTE — Assessment & Plan Note (Signed)
Breathing is normal during the video chat and improving overall. Will hold off on steroids given coronavirus outbreak and this could be related. Rx for doxycycline and continue trelegy.

## 2018-12-19 NOTE — Telephone Encounter (Signed)
Patient is having chest congestion.  She is coughing up yellow mucus.  Patient does not have access to do a V visit.   Please advise.

## 2019-01-21 ENCOUNTER — Other Ambulatory Visit: Payer: Self-pay | Admitting: Internal Medicine

## 2019-01-21 NOTE — Telephone Encounter (Signed)
Pt's daughter-in-law would like to know why this medication needs approval each month? Can it be approved for 6 months?  Kim: [phone]

## 2019-01-21 NOTE — Telephone Encounter (Signed)
Please advise 

## 2019-01-30 ENCOUNTER — Ambulatory Visit: Payer: Self-pay | Admitting: *Deleted

## 2019-01-30 NOTE — Telephone Encounter (Signed)
Yes it is safe to use.

## 2019-01-30 NOTE — Telephone Encounter (Signed)
Patient informed of MD response  

## 2019-01-30 NOTE — Telephone Encounter (Signed)
Pt is now using Voltaren gel and she want to know if it will affect her with taking any of her medication. Please call pt  I have inserted Voltaren gel in list( Micromedex) with all medications patient takes and the only warning is with the aspirin. Call to patient: Alerted patient of warning - she is on very low dose of aspirin and she is using gel externally. Also advised her she can check with pharmacist with questions of medication interactions- especially when purchasing over the counter medications- they are good resource. Will send note to PCP for review.  Reason for Disposition . Caller has medication question about med not prescribed by PCP and triager unable to answer question (e.g., compatibility with other med, storage)  Protocols used: MEDICATION QUESTION CALL-A-AH

## 2019-01-30 NOTE — Telephone Encounter (Signed)
Patient states that she got the medication OTC at [facility] they are now offering medication as OTC diclofenac sodium 1% gel. 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

## 2019-01-30 NOTE — Telephone Encounter (Signed)
Did someone prescribe this for her? It is not an otc medication. I'm happy to answer but typically if you have a question about a medication you should call prescribing physician first. It is safe for her to use.

## 2019-02-16 ENCOUNTER — Other Ambulatory Visit: Payer: Self-pay | Admitting: Internal Medicine

## 2019-03-02 ENCOUNTER — Other Ambulatory Visit: Payer: Self-pay | Admitting: Internal Medicine

## 2019-03-02 NOTE — Telephone Encounter (Signed)
Control database checked last refill: 11/24/2018 LOV: acute 12/19/2018, cpe 06/05/2018 NOV: none

## 2019-03-30 ENCOUNTER — Other Ambulatory Visit: Payer: Self-pay | Admitting: Internal Medicine

## 2019-04-26 ENCOUNTER — Other Ambulatory Visit: Payer: Self-pay | Admitting: Internal Medicine

## 2019-05-27 ENCOUNTER — Other Ambulatory Visit: Payer: Self-pay | Admitting: Internal Medicine

## 2019-06-02 ENCOUNTER — Telehealth: Payer: Self-pay | Admitting: Internal Medicine

## 2019-06-02 NOTE — Telephone Encounter (Signed)
Spoke with patient regarding AWV. Patient stated she is not able to schedule at this time due to transportation. Patient asked (if possible) when she comes in the office for her appointment on 06/18/19 if she could bring in her urine specimen. Patient would like a call back.

## 2019-06-03 NOTE — Telephone Encounter (Signed)
I called pt- she states her daughter will be bringing her to the 06/18/19 OV. Her daughter is the only person working for the family and can not miss much work. She declines scheduling AWV at this time.

## 2019-06-03 NOTE — Telephone Encounter (Signed)
I'm not sure why she is asking about urine specimen. AWV can be made on telephone with our health coach so she would not need to come in for that.

## 2019-06-16 ENCOUNTER — Other Ambulatory Visit: Payer: Self-pay | Admitting: Internal Medicine

## 2019-06-18 ENCOUNTER — Ambulatory Visit (INDEPENDENT_AMBULATORY_CARE_PROVIDER_SITE_OTHER): Payer: Medicare HMO | Admitting: Internal Medicine

## 2019-06-18 ENCOUNTER — Ambulatory Visit: Payer: Medicare HMO | Admitting: Internal Medicine

## 2019-06-18 ENCOUNTER — Encounter: Payer: Self-pay | Admitting: Internal Medicine

## 2019-06-18 ENCOUNTER — Other Ambulatory Visit: Payer: Self-pay

## 2019-06-18 ENCOUNTER — Other Ambulatory Visit (INDEPENDENT_AMBULATORY_CARE_PROVIDER_SITE_OTHER): Payer: Medicare HMO

## 2019-06-18 VITALS — BP 124/82 | HR 100 | Temp 98.3°F | Ht 59.0 in | Wt 122.0 lb

## 2019-06-18 DIAGNOSIS — I10 Essential (primary) hypertension: Secondary | ICD-10-CM | POA: Diagnosis not present

## 2019-06-18 DIAGNOSIS — I69398 Other sequelae of cerebral infarction: Secondary | ICD-10-CM | POA: Diagnosis not present

## 2019-06-18 DIAGNOSIS — R209 Unspecified disturbances of skin sensation: Secondary | ICD-10-CM

## 2019-06-18 DIAGNOSIS — Z23 Encounter for immunization: Secondary | ICD-10-CM | POA: Diagnosis not present

## 2019-06-18 DIAGNOSIS — J42 Unspecified chronic bronchitis: Secondary | ICD-10-CM

## 2019-06-18 DIAGNOSIS — E118 Type 2 diabetes mellitus with unspecified complications: Secondary | ICD-10-CM

## 2019-06-18 DIAGNOSIS — E785 Hyperlipidemia, unspecified: Secondary | ICD-10-CM

## 2019-06-18 DIAGNOSIS — Z0000 Encounter for general adult medical examination without abnormal findings: Secondary | ICD-10-CM

## 2019-06-18 DIAGNOSIS — E1169 Type 2 diabetes mellitus with other specified complication: Secondary | ICD-10-CM

## 2019-06-18 LAB — COMPREHENSIVE METABOLIC PANEL
ALT: 13 U/L (ref 0–35)
AST: 15 U/L (ref 0–37)
Albumin: 4.4 g/dL (ref 3.5–5.2)
Alkaline Phosphatase: 75 U/L (ref 39–117)
BUN: 18 mg/dL (ref 6–23)
CO2: 29 mEq/L (ref 19–32)
Calcium: 10.5 mg/dL (ref 8.4–10.5)
Chloride: 102 mEq/L (ref 96–112)
Creatinine, Ser: 1.07 mg/dL (ref 0.40–1.20)
GFR: 49.72 mL/min — ABNORMAL LOW (ref >60.00)
Glucose, Bld: 114 mg/dL — ABNORMAL HIGH (ref 70–99)
Potassium: 4.2 mEq/L (ref 3.5–5.1)
Sodium: 139 mEq/L (ref 135–145)
Total Bilirubin: 0.4 mg/dL (ref 0.2–1.2)
Total Protein: 7.5 g/dL (ref 6.0–8.3)

## 2019-06-18 LAB — LIPID PANEL
Cholesterol: 165 mg/dL (ref 0–200)
HDL: 76.6 mg/dL (ref >39.00)
LDL Cholesterol: 73 mg/dL (ref 0–99)
NonHDL: 88.02
Total CHOL/HDL Ratio: 2
Triglycerides: 74 mg/dL (ref 0.0–149.0)
VLDL: 14.8 mg/dL (ref 0.0–40.0)

## 2019-06-18 LAB — FERRITIN: Ferritin: 41.2 ng/mL (ref 10.0–291.0)

## 2019-06-18 LAB — URINALYSIS, ROUTINE W REFLEX MICROSCOPIC
Bilirubin Urine: NEGATIVE
Ketones, ur: NEGATIVE
Nitrite: NEGATIVE
Specific Gravity, Urine: 1.015 (ref 1.000–1.030)
Total Protein, Urine: NEGATIVE
Urine Glucose: NEGATIVE
Urobilinogen, UA: 0.2 (ref 0.0–1.0)
pH: 6 (ref 5.0–8.0)

## 2019-06-18 LAB — CBC
HCT: 43.9 % (ref 36.0–46.0)
Hemoglobin: 14.4 g/dL (ref 12.0–15.0)
MCHC: 32.9 g/dL (ref 30.0–36.0)
MCV: 90 fl (ref 78.0–100.0)
Platelets: 247 10*3/uL (ref 150.0–400.0)
RBC: 4.87 Mil/uL (ref 3.87–5.11)
RDW: 14.1 % (ref 11.5–15.5)
WBC: 11 10*3/uL — ABNORMAL HIGH (ref 4.0–10.5)

## 2019-06-18 LAB — MICROALBUMIN / CREATININE URINE RATIO
Creatinine,U: 46.8 mg/dL
Microalb Creat Ratio: 2.3 mg/g (ref 0.0–30.0)
Microalb, Ur: 1.1 mg/dL (ref 0.0–1.9)

## 2019-06-18 LAB — HEMOGLOBIN A1C: Hgb A1c MFr Bld: 6.9 % — ABNORMAL HIGH (ref 4.6–6.5)

## 2019-06-18 NOTE — Progress Notes (Signed)
Subjective:   Patient ID: Sherry Lambert, female    DOB: 1941/06/22, 78 y.o.   MRN: [id number]  HPI Here for medicare wellness and physical, no new complaints. Please see A/P for status and treatment of chronic medical problems.   Diet: DM since diabetic Physical activity: sedentary Depression/mood screen: negative Hearing: intact to whispered voice Visual acuity: grossly normal, performs annual eye exam  ADLs: capable Fall risk: low Home safety: good Cognitive evaluation: intact to orientation, naming, recall and repetition EOL planning: adv directives discussed    Office Visit from 09/15/2018 in [place]  PHQ-2 Total Score  0      I have personally reviewed and have noted 1. The patient's medical and social history - reviewed today no changes 2. Their use of alcohol, tobacco or illicit drugs 3. Their current medications and supplements 4. The patient's functional ability including ADL's, fall risks, home safety risks and hearing or visual impairment. 5. Diet and physical activities 6. Evidence for depression or mood disorders 7. Care team reviewed and updated  Patient Care Team: [name], MD as PCP - General (Internal Medicine) Past Medical History:  Diagnosis Date  . 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 [name]   . Cataract   . Chronic kidney disease    kidney stone  . Colitis   . Colon cancer ([place]) 06/2017   cecum  . Diabetes mellitus without complication ([place])    no meds taken  . Emphysema of lung ([place])   . GERD (gastroesophageal reflux disease)   . Hyperlipidemia   . Hypertension   . Peripheral arterial disease ([place])   . [name]   . Stroke [facility])    November 2016- Thanksgiving night   Past Surgical History:  Procedure Laterality Date  . ABDOMINAL HYSTERECTOMY    . BALLOON DILATION N/A 12/23/2012   Procedure: BALLOON DILATION;  Surgeon: Inda Castle, MD;  Location: [facility];   Service: Endoscopy;  Laterality: N/A;  . BALLOON DILATION N/A 01/15/2013   Procedure: BALLOON DILATION;  Surgeon: Inda Castle, MD;  Location: [facility];  Service: Endoscopy;  Laterality: N/A;  . BALLOON DILATION N/A 07/22/2018   Procedure: BALLOON DILATION;  Surgeon: Mauri Pole, MD;  Location: [facility];  Service: Endoscopy;  Laterality: N/A;  . cataract     both eyes  . COLONOSCOPY    . COLONOSCOPY WITH PROPOFOL N/A 03/05/2017   Procedure: COLONOSCOPY WITH PROPOFOL;  Surgeon: Mauri Pole, MD;  Location: [facility];  Service: Endoscopy;  Laterality: N/A;  . DENTAL RESTORATION/EXTRACTION WITH X-RAY    . ESOPHAGOGASTRODUODENOSCOPY N/A 12/23/2012   Procedure: ESOPHAGOGASTRODUODENOSCOPY (EGD);  Surgeon: Inda Castle, MD;  Location: [facility];  Service: Endoscopy;  Laterality: N/A;  . ESOPHAGOGASTRODUODENOSCOPY N/A 01/15/2013   Procedure: ESOPHAGOGASTRODUODENOSCOPY (EGD);  Surgeon: Inda Castle, MD;  Location: [facility];  Service: Endoscopy;  Laterality: N/A;  . ESOPHAGOGASTRODUODENOSCOPY (EGD) WITH PROPOFOL N/A 07/22/2018   Procedure: ESOPHAGOGASTRODUODENOSCOPY (EGD) WITH PROPOFOL;  Surgeon: Mauri Pole, MD;  Location: [facility];  Service: Endoscopy;  Laterality: N/A;  . TONSILLECTOMY     Family History  Problem Relation Age of Onset  . Hypertension Father   . Diabetes Father   . Cancer Father        melanoma  . Colon cancer Sister 54  . Cancer Sister        colon, surgery alone   . Early death Neg Hx   .  Stroke Neg Hx   . Esophageal cancer Neg Hx   . Rectal cancer Neg Hx   . Stomach cancer Neg Hx     Review of Systems  Constitutional: Negative.   HENT: Negative.   Eyes: Negative.   Respiratory: Negative for cough, chest tightness and shortness of breath.   Cardiovascular: Negative for chest pain, palpitations and leg swelling.  Gastrointestinal: Negative for abdominal distention, abdominal pain, constipation, diarrhea, nausea and  vomiting.  Musculoskeletal: Positive for arthralgias.  Skin: Negative.   Neurological: Positive for numbness.  Psychiatric/Behavioral: Negative.     Objective:  Physical Exam Constitutional:      Appearance: She is well-developed.  HENT:     Head: Normocephalic and atraumatic.  Neck:     Musculoskeletal: Normal range of motion.  Cardiovascular:     Rate and Rhythm: Normal rate and regular rhythm.  Pulmonary:     Effort: Pulmonary effort is normal. No respiratory distress.     Breath sounds: Normal breath sounds. No wheezing or rales.  Abdominal:     General: Bowel sounds are normal. There is no distension.     Palpations: Abdomen is soft.     Tenderness: There is no abdominal tenderness. There is no rebound.  Skin:    General: Skin is warm and dry.  Neurological:     Mental Status: She is alert and oriented to person, place, and time. Mental status is at baseline.     Cranial Nerves: Cranial nerve deficit present.     Coordination: Coordination normal.     Comments: Walker for ambulation     Vitals:   06/18/19 0753  BP: 124/82  Pulse: 100  Temp: 98.3 F (36.8 C)  TempSrc: Oral  SpO2: 95%  Weight: 122 lb (55.3 kg)  Height: 4\' 11"  (1.499 m)    Assessment & Plan:  Flu shot given at visit

## 2019-06-18 NOTE — Assessment & Plan Note (Signed)
Checking lipid panel and adjust as needed. Taking pravastatin 20 mg daily.

## 2019-06-18 NOTE — Assessment & Plan Note (Signed)
Flu shot given. Pneumonia complete. Shingrix counseled. Tetanus due 2024. Colonoscopy follows with GI and due within 3 years. Mammogram up to date and declines further, pap smear aged out and dexa declines today but due. Counseled about sun safety and mole surveillance. Counseled about the dangers of distracted driving. Given 10 year screening recommendations.

## 2019-06-18 NOTE — Assessment & Plan Note (Signed)
Complicated by past stroke. Taking statin. Diet controlled. Not on ACE-I or ARB. Checking HgA1c and microalbumin to creatinine ratio. Reminded about yearly eye exam. Foot exam done with stable changes.

## 2019-06-18 NOTE — Patient Instructions (Addendum)
Neck Exercises Ask your health care provider which exercises are safe for you. Do exercises exactly as told by your health care provider and adjust them as directed. It is normal to feel mild stretching, pulling, tightness, or discomfort as you do these exercises. Stop right away if you feel sudden pain or your pain gets worse. Do not begin these exercises until told by your health care provider. Neck exercises can be important for many reasons. They can improve strength and maintain flexibility in your neck, which will help your upper back and prevent neck pain. Stretching exercises Rotation neck stretching  1. Sit in a chair or stand up. 2. Place your feet flat on the floor, shoulder width apart. 3. Slowly turn your head (rotate) to the right until a slight stretch is felt. Turn it all the way to the right so you can look over your right shoulder. Do not tilt or tip your head. 4. Hold this position for 10-30 seconds. 5. Slowly turn your head (rotate) to the left until a slight stretch is felt. Turn it all the way to the left so you can look over your left shoulder. Do not tilt or tip your head. 6. Hold this position for 10-30 seconds. Repeat __________ times. Complete this exercise __________ times a day. Neck retraction 1. Sit in a sturdy chair or stand up. 2. Look straight ahead. Do not bend your neck. 3. Use your fingers to push your chin backward (retraction). Do not bend your neck for this movement. Continue to face straight ahead. If you are doing the exercise properly, you will feel a slight sensation in your throat and a stretch at the back of your neck. 4. Hold the stretch for 1-2 seconds. Repeat __________ times. Complete this exercise __________ times a day. Strengthening exercises Neck press 1. Lie on your back on a firm bed or on the floor with a pillow under your head. 2. Use your neck muscles to push your head down on the pillow and straighten your spine. 3. Hold the  position as well as you can. Keep your head facing up (in a neutral position) and your chin tucked. 4. Slowly count to 5 while holding this position. Repeat __________ times. Complete this exercise __________ times a day. Isometrics These are exercises in which you strengthen the muscles in your neck while keeping your neck still (isometrics). 1. Sit in a supportive chair and place your hand on your forehead. 2. Keep your head and face facing straight ahead. Do not flex or extend your neck while doing isometrics. 3. Push forward with your head and neck while pushing back with your hand. Hold for 10 seconds. 4. Do the sequence again, this time putting your hand against the back of your head. Use your head and neck to push backward against the hand pressure. 5. Finally, do the same exercise on either side of your head, pushing sideways against the pressure of your hand. Repeat __________ times. Complete this exercise __________ times a day. Prone head lifts 1. Lie face-down (prone position), resting on your elbows so that your chest and upper back are raised. 2. Start with your head facing downward, near your chest. Position your chin either on or near your chest. 3. Slowly lift your head upward. Lift until you are looking straight ahead. Then continue lifting your head as far back as you can comfortably stretch. 4. Hold your head up for 5 seconds. Then slowly lower it to your starting position.  Repeat __________ times. Complete this exercise __________ times a day. Supine head lifts 1. Lie on your back (supine position), bending your knees to point to the ceiling and keeping your feet flat on the floor. 2. Lift your head slowly off the floor, raising your chin toward your chest. 3. Hold for 5 seconds. Repeat __________ times. Complete this exercise __________ times a day. Scapular retraction 1. Stand with your arms at your sides. Look straight ahead. 2. Slowly pull both shoulders (scapulae)  backward and downward (retraction) until you feel a stretch between your shoulder blades in your upper back. 3. Hold for 10-30 seconds. 4. Relax and repeat. Repeat __________ times. Complete this exercise __________ times a day. Contact a health care provider if:  Your neck pain or discomfort gets much worse when you do an exercise.  Your neck pain or discomfort does not improve within 2 hours after you exercise. If you have any of these problems, stop exercising right away. Do not do the exercises again unless your health care provider says that you can. Get help right away if:  You develop sudden, severe neck pain. If this happens, stop exercising right away. Do not do the exercises again unless your health care provider says that you can. This information is not intended to replace advice given to you by your health care provider. Make sure you discuss any questions you have with your health care provider. Document Released: 08/01/2015 Document Revised: 06/18/2018 Document Reviewed: 06/18/2018 Elsevier Patient Education  2020 [place] Maintenance, Female Adopting a healthy lifestyle and getting preventive care are important in promoting health and wellness. Ask your health care provider about:  The right schedule for you to have regular tests and exams.  Things you can do on your own to prevent diseases and keep yourself healthy. What should I know about diet, weight, and exercise? Eat a healthy diet   Eat a diet that includes plenty of vegetables, fruits, low-fat dairy products, and lean protein.  Do not eat a lot of foods that are high in solid fats, added sugars, or sodium. Maintain a healthy weight Body mass index (BMI) is used to identify weight problems. It estimates body fat based on height and weight. Your health care provider can help determine your BMI and help you achieve or maintain a healthy weight. Get regular exercise Get regular exercise. This is one  of the most important things you can do for your health. Most adults should:  Exercise for at least 150 minutes each week. The exercise should increase your heart rate and make you sweat (moderate-intensity exercise).  Do strengthening exercises at least twice a week. This is in addition to the moderate-intensity exercise.  Spend less time sitting. Even light physical activity can be beneficial. Watch cholesterol and blood lipids Have your blood tested for lipids and cholesterol at 77 years of age, then have this test every 5 years. Have your cholesterol levels checked more often if:  Your lipid or cholesterol levels are high.  You are older than 77 years of age.  You are at high risk for heart disease. What should I know about cancer screening? Depending on your health history and family history, you may need to have cancer screening at various ages. This may include screening for:  Breast cancer.  Cervical cancer.  Colorectal cancer.  Skin cancer.  Lung cancer. What should I know about heart disease, diabetes, and high blood pressure? Blood pressure and heart disease  High blood  pressure causes heart disease and increases the risk of stroke. This is more likely to develop in people who have high blood pressure readings, are of African descent, or are overweight.  Have your blood pressure checked: ? Every 3-5 years if you are 57-49 years of age. ? Every year if you are 60 years old or older. Diabetes Have regular diabetes screenings. This checks your fasting blood sugar level. Have the screening done:  Once every three years after age 49 if you are at a normal weight and have a low risk for diabetes.  More often and at a younger age if you are overweight or have a high risk for diabetes. What should I know about preventing infection? Hepatitis B If you have a higher risk for hepatitis B, you should be screened for this virus. Talk with your health care provider to find  out if you are at risk for hepatitis B infection. Hepatitis C Testing is recommended for:  Everyone born from [birth year] through 1964.  Anyone with known risk factors for hepatitis C. Sexually transmitted infections (STIs)  Get screened for STIs, including gonorrhea and chlamydia, if: ? You are sexually active and are younger than 77 years of age. ? You are older than 77 years of age and your health care provider tells you that you are at risk for this type of infection. ? Your sexual activity has changed since you were last screened, and you are at increased risk for chlamydia or gonorrhea. Ask your health care provider if you are at risk.  Ask your health care provider about whether you are at high risk for HIV. Your health care provider may recommend a prescription medicine to help prevent HIV infection. If you choose to take medicine to prevent HIV, you should first get tested for HIV. You should then be tested every 3 months for as long as you are taking the medicine. Pregnancy  If you are about to stop having your period (premenopausal) and you may become pregnant, seek counseling before you get pregnant.  Take 400 to 800 micrograms (mcg) of folic acid every day if you become pregnant.  Ask for birth control (contraception) if you want to prevent pregnancy. Osteoporosis and menopause Osteoporosis is a disease in which the bones lose minerals and strength with aging. This can result in bone fractures. If you are 64 years old or older, or if you are at risk for osteoporosis and fractures, ask your health care provider if you should:  Be screened for bone loss.  Take a calcium or vitamin D supplement to lower your risk of fractures.  Be given hormone replacement therapy (HRT) to treat symptoms of menopause. Follow these instructions at home: Lifestyle  Do not use any products that contain nicotine or tobacco, such as cigarettes, e-cigarettes, and chewing tobacco. If you need help  quitting, ask your health care provider.  Do not use street drugs.  Do not share needles.  Ask your health care provider for help if you need support or information about quitting drugs. Alcohol use  Do not drink alcohol if: ? Your health care provider tells you not to drink. ? You are pregnant, may be pregnant, or are planning to become pregnant.  If you drink alcohol: ? Limit how much you use to 0-1 drink a day. ? Limit intake if you are breastfeeding.  Be aware of how much alcohol is in your drink. In the [place] one drink equals one 12 oz bottle of beer (  355 mL), one 5 oz glass of wine (148 mL), or one 1 oz glass of hard liquor (44 mL). General instructions  Schedule regular health, dental, and eye exams.  Stay current with your vaccines.  Tell your health care provider if: ? You often feel depressed. ? You have ever been abused or do not feel safe at home. Summary  Adopting a healthy lifestyle and getting preventive care are important in promoting health and wellness.  Follow your health care provider's instructions about healthy diet, exercising, and getting tested or screened for diseases.  Follow your health care provider's instructions on monitoring your cholesterol and blood pressure. This information is not intended to replace advice given to you by your health care provider. Make sure you discuss any questions you have with your health care provider. Document Released: 03/05/2011 Document Revised: 08/13/2018 Document Reviewed: 08/13/2018 Elsevier Patient Education  2020 [employer].

## 2019-06-18 NOTE — Assessment & Plan Note (Signed)
BP at goal no meds currently. Past stroke. Checking CMP and adjust as needed.

## 2019-06-18 NOTE — Assessment & Plan Note (Signed)
Still with decreased sensation right side of body on arms and legs. Using gabapentin 300 mg TID which helps some.

## 2019-06-18 NOTE — Assessment & Plan Note (Signed)
Stable on trelegy but in doughnut hole. Uses albuterol prn and no flare today.

## 2019-06-19 ENCOUNTER — Other Ambulatory Visit: Payer: Self-pay | Admitting: Internal Medicine

## 2019-06-19 MED ORDER — NITROFURANTOIN MONOHYD MACRO 100 MG PO CAPS: 100.0000 mg | ORAL_CAPSULE | Freq: Two times a day (BID) | ORAL | 0 refills | Status: DC

## 2019-06-26 DIAGNOSIS — M1711 Unilateral primary osteoarthritis, right knee: Secondary | ICD-10-CM | POA: Diagnosis not present

## 2019-07-07 ENCOUNTER — Ambulatory Visit (INDEPENDENT_AMBULATORY_CARE_PROVIDER_SITE_OTHER): Payer: Medicare HMO | Admitting: Internal Medicine

## 2019-07-07 ENCOUNTER — Other Ambulatory Visit: Payer: Self-pay

## 2019-07-07 ENCOUNTER — Encounter: Payer: Self-pay | Admitting: Internal Medicine

## 2019-07-07 DIAGNOSIS — R252 Cramp and spasm: Secondary | ICD-10-CM | POA: Insufficient documentation

## 2019-07-07 MED ORDER — MAGNESIUM OXIDE 400 MG PO CAPS: 400.0000 mg | ORAL_CAPSULE | Freq: Every day | ORAL | 0 refills | Status: DC

## 2019-07-07 NOTE — Patient Instructions (Signed)
You can try 2 tums once or twice a day to help the cramps.   You can try magnesium over the counter for 1-2 weeks to see if this helps the cramps.

## 2019-07-07 NOTE — Assessment & Plan Note (Signed)
Advised to try tums and/or magnesium supplement to see if this helps. Most recent potassium levels are normal so the bananas are likely not helping. Can take tylenol for pain as needed. We discussed otc products with lidocaine as a safe options also.

## 2019-07-07 NOTE — Progress Notes (Signed)
   Subjective:   Patient ID: Sherry Lambert, female    DOB: 13-Dec-1940, 78 y.o.   MRN: [id number]  HPI The patient is a 78 YO female coming in for concerns about muscle cramps. She is having these worsening in her arms and legs. She has had them for a long time but worse in the last several weeks. She is eating more bananas which has not helped. They will start in the evening more but can occur anytime throughout the day. She does not take anything when they happen as she is not sure what to take. Last <30 minutes. Denies fevers or chills. Denies swelling in the legs. Denies cough or SOB or respiratory symptoms.   Review of Systems  Constitutional: Negative.   HENT: Negative.   Eyes: Negative.   Respiratory: Negative for cough, chest tightness and shortness of breath.   Cardiovascular: Negative for chest pain, palpitations and leg swelling.  Gastrointestinal: Negative for abdominal distention, abdominal pain, constipation, diarrhea, nausea and vomiting.  Musculoskeletal: Positive for myalgias.  Skin: Negative.   Neurological: Positive for numbness.  Psychiatric/Behavioral: Negative.     Objective:  Physical Exam Constitutional:      Appearance: She is well-developed.  HENT:     Head: Normocephalic and atraumatic.  Neck:     Musculoskeletal: Normal range of motion.  Cardiovascular:     Rate and Rhythm: Normal rate and regular rhythm.  Pulmonary:     Effort: Pulmonary effort is normal. No respiratory distress.     Breath sounds: Normal breath sounds. No wheezing or rales.  Abdominal:     General: Bowel sounds are normal. There is no distension.     Palpations: Abdomen is soft.     Tenderness: There is no abdominal tenderness. There is no rebound.  Musculoskeletal:        General: Tenderness present.     Comments: Minimal tenderness to the calf muscles without swelling, rash, or concern for dvt  Skin:    General: Skin is warm and dry.  Neurological:     Mental Status: She is  alert and oriented to person, place, and time. Mental status is at baseline.     Cranial Nerves: Cranial nerve deficit present.     Coordination: Coordination abnormal.     Comments: Walker with seat     Vitals:   07/07/19 0852  BP: 118/72  Pulse: 80  Temp: 98.5 F (36.9 C)  TempSrc: Oral  SpO2: 99%  Weight: 122 lb (55.3 kg)  Height: 4\' 11"  (1.499 m)    Assessment & Plan:

## 2019-07-08 ENCOUNTER — Other Ambulatory Visit: Payer: Self-pay | Admitting: Internal Medicine

## 2019-08-04 ENCOUNTER — Other Ambulatory Visit: Payer: Self-pay | Admitting: Internal Medicine

## 2019-08-04 NOTE — Addendum Note (Signed)
Addended by: Jefferson Fuel on: 08/04/2019 01:40 PM   Modules accepted: Orders

## 2019-08-04 NOTE — Telephone Encounter (Signed)
Requested medication (s) are due for refill today: yes  Requested medication (s) are on the active medication list: yes  Last refill:  12/19/2018  Future visit scheduled: no  Notes to clinic:  Refill cannot be delegated    Requested Prescriptions  Pending Prescriptions Disp Refills   promethazine-dextromethorphan (PROMETHAZINE-DM) 6.25-15 MG/5ML syrup 118 mL 0    Sig: Take 5 mLs by mouth 2 (two) times daily as needed for cough.     Ear, Nose, and Throat:  Antitussives/Expectorants Passed - 08/04/2019  1:41 PM      Passed - Valid encounter within last 12 months    Recent Outpatient Visits          4 weeks ago Muscle cramps   [place], Elizabeth A, MD   1 month ago Routine general medical examination at a health care facility   [place], [place], MD   7 months ago Chronic obstructive pulmonary disease with acute exacerbation [facility])   [place], Elizabeth A, MD   9 months ago Leukocytosis, unspecified type   [place], Elizabeth A, MD   10 months ago Fever, unspecified fever cause   [facility], Delphia Grates, NP             Signed Prescriptions Disp Refills   TRELEGY ELLIPTA 100-62.5-25 MCG/INH AEPB 60 each 0    Sig: Inhale 1 puff by mouth once daily     Off-Protocol Failed - 08/04/2019  9:00 AM      Failed - Medication not assigned to a protocol, review manually.      Passed - Valid encounter within last 12 months    Recent Outpatient Visits          4 weeks ago Muscle cramps   [place], Elizabeth A, MD   1 month ago Routine general medical examination at a health care facility   [place], [place], MD   7 months ago Chronic obstructive pulmonary disease with acute exacerbation [facility])   [place], [place], MD   9 months ago Leukocytosis, unspecified type   [place], MD   10 months ago Fever, unspecified fever cause   [place], Delphia Grates, NP

## 2019-08-04 NOTE — Telephone Encounter (Signed)
Medication Refill - Medication: promethazine-dextromethorphan (PROMETHAZINE-DM) 6.25-15 MG/5ML syrup   Has the patient contacted their pharmacy? Yes.   (Agent: If no, request that the patient contact the pharmacy for the refill.) (Agent: If yes, when and what did the pharmacy advise?)  Preferred Pharmacy (with phone number or street name):  [place] [address] [facility], [place] [address]  Phone: [phone] Fax: [phone]     Agent: Please be advised that RX refills may take up to 3 business days. We ask that you follow-up with your pharmacy.

## 2019-08-05 MED ORDER — PROMETHAZINE-DM 6.25-15 MG/5ML PO SYRP: 5.0000 mL | ORAL_SOLUTION | Freq: Two times a day (BID) | ORAL | 0 refills | Status: DC | PRN

## 2019-08-05 NOTE — Telephone Encounter (Signed)
Please advise in PCP's absence. Thanks! 

## 2019-09-14 ENCOUNTER — Other Ambulatory Visit: Payer: Self-pay | Admitting: Internal Medicine

## 2019-09-14 NOTE — Telephone Encounter (Signed)
Filled 03/02/2019 Tramadol Hcl 50 Mg Tablet 30#  Last ov 07/07/19 Next ov n/s

## 2019-10-17 ENCOUNTER — Other Ambulatory Visit: Payer: Self-pay

## 2019-10-17 ENCOUNTER — Emergency Department (HOSPITAL_BASED_OUTPATIENT_CLINIC_OR_DEPARTMENT_OTHER): Payer: Medicare HMO

## 2019-10-17 ENCOUNTER — Emergency Department (HOSPITAL_BASED_OUTPATIENT_CLINIC_OR_DEPARTMENT_OTHER)
Admission: EM | Admit: 2019-10-17 | Discharge: 2019-10-17 | Disposition: A | Discharge status: Home / Self Care | Payer: Medicare HMO | Attending: Emergency Medicine | Admitting: Emergency Medicine

## 2019-10-17 DIAGNOSIS — Y998 Other external cause status: Secondary | ICD-10-CM | POA: Insufficient documentation

## 2019-10-17 DIAGNOSIS — I129 Hypertensive chronic kidney disease with stage 1 through stage 4 chronic kidney disease, or unspecified chronic kidney disease: Secondary | ICD-10-CM | POA: Diagnosis not present

## 2019-10-17 DIAGNOSIS — Z7982 Long term (current) use of aspirin: Secondary | ICD-10-CM | POA: Insufficient documentation

## 2019-10-17 DIAGNOSIS — S20211A Contusion of right front wall of thorax, initial encounter: Secondary | ICD-10-CM | POA: Diagnosis not present

## 2019-10-17 DIAGNOSIS — Z8673 Personal history of transient ischemic attack (TIA), and cerebral infarction without residual deficits: Secondary | ICD-10-CM | POA: Insufficient documentation

## 2019-10-17 DIAGNOSIS — Z79899 Other long term (current) drug therapy: Secondary | ICD-10-CM | POA: Diagnosis not present

## 2019-10-17 DIAGNOSIS — W19XXXA Unspecified fall, initial encounter: Secondary | ICD-10-CM | POA: Diagnosis not present

## 2019-10-17 DIAGNOSIS — E1122 Type 2 diabetes mellitus with diabetic chronic kidney disease: Secondary | ICD-10-CM | POA: Insufficient documentation

## 2019-10-17 DIAGNOSIS — Z85038 Personal history of other malignant neoplasm of large intestine: Secondary | ICD-10-CM | POA: Insufficient documentation

## 2019-10-17 DIAGNOSIS — Y92018 Other place in single-family (private) house as the place of occurrence of the external cause: Secondary | ICD-10-CM | POA: Insufficient documentation

## 2019-10-17 DIAGNOSIS — Z87891 Personal history of nicotine dependence: Secondary | ICD-10-CM | POA: Insufficient documentation

## 2019-10-17 DIAGNOSIS — N189 Chronic kidney disease, unspecified: Secondary | ICD-10-CM | POA: Diagnosis not present

## 2019-10-17 DIAGNOSIS — Y9389 Activity, other specified: Secondary | ICD-10-CM | POA: Insufficient documentation

## 2019-10-17 DIAGNOSIS — M25511 Pain in right shoulder: Secondary | ICD-10-CM | POA: Diagnosis not present

## 2019-10-17 DIAGNOSIS — S299XXA Unspecified injury of thorax, initial encounter: Secondary | ICD-10-CM | POA: Diagnosis not present

## 2019-10-17 MED ORDER — LORAZEPAM 1 MG PO TABS
0.5000 mg | ORAL_TABLET | Freq: Once | ORAL | Status: AC
  Administered 2019-10-17: 12:00:00 0.5 mg via ORAL
  Filled 2019-10-17: qty 1

## 2019-10-17 MED ORDER — HYDROCODONE-ACETAMINOPHEN 5-325 MG PO TABS: 1.0000 | ORAL_TABLET | Freq: Three times a day (TID) | ORAL | 0 refills | Status: DC | PRN

## 2019-10-17 MED ORDER — MORPHINE SULFATE (PF) 4 MG/ML IV SOLN
4.0000 mg | Freq: Once | INTRAVENOUS | Status: AC
  Administered 2019-10-17: 4 mg via INTRAMUSCULAR
  Filled 2019-10-17: qty 1

## 2019-10-17 NOTE — ED Triage Notes (Signed)
Pt states took tramadol one hour ago

## 2019-10-17 NOTE — Discharge Instructions (Signed)
You can continue to take tramadol as needed for pain. If you feel like you need something stronger then use the vicodin. Take one OR the other. Do not take simultaneously.   Your x-rays do not show any broken bones or other signs of concerning injury.

## 2019-10-17 NOTE — ED Triage Notes (Signed)
Pt arrives private vehicle complaining right arm, side, shoulder pain after fall 2 days ago.  Visible bruising to right arm, right posterior ribcage.  Pt reluctant to take deep breath due to pain,

## 2019-10-17 NOTE — ED Provider Notes (Signed)
[facility] Provider Note   CSN: [id number] Arrival date & time: 10/17/19  1154     History Chief Complaint  Patient presents with  . Fall  . Chest Pain    Sherry Lambert is a 79 y.o. female.  HPI   79 year old female with right chest and right upper extremity pain.  She had a fall on Tuesday.  She has had persistent pain since that time.  She has been taking tramadol without much improvement.  Pain is worse with movement and deep breathing.  Denies any headaches.  Denies any acute neck or midline back pain.  Past Medical History:  Diagnosis Date  . 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 [name]   . Cataract   . Chronic kidney disease    kidney stone  . Colitis   . Colon cancer ([place]) 06/2017   cecum  . Diabetes mellitus without complication ([place])    no meds taken  . Emphysema of lung ([place])   . GERD (gastroesophageal reflux disease)   . Hyperlipidemia   . Hypertension   . Peripheral arterial disease ([place])   . [name]   . Stroke [facility])    November 2016- Thanksgiving night    Patient Active Problem List   Diagnosis Date Noted  . Muscle cramps 07/07/2019  . Esophageal dysphagia   . Venous insufficiency 11/29/2017  . Urinary incontinence 11/29/2017  . 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 Cecum mass   . Mass in rectum   . PVD (peripheral vascular disease) ([place]) 09/14/2016  . COPD (chronic obstructive pulmonary disease) ([place]) 12/01/2015  . Essential hypertension 12/20/2014  . Routine general medical examination at a health care facility 10/22/2014  . 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 Peripheral arterial disease ([place]) 01/05/2014  . Osteopenia 11/12/2013  . Hyperlipidemia associated with type 2 diabetes mellitus ([place]) 09/19/2013  . Esophageal stricture 12/23/2012  . 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 ABDOMINAL HYSTERECTOMY    . BALLOON DILATION N/A 12/23/2012   Procedure: BALLOON DILATION;  Surgeon: Inda Castle, MD;  Location: [facility];  Service: Endoscopy;  Laterality: N/A;  . BALLOON DILATION N/A 01/15/2013   Procedure: BALLOON DILATION;  Surgeon: Inda Castle, MD;  Location: [facility];  Service: Endoscopy;  Laterality: N/A;  . BALLOON DILATION N/A 07/22/2018   Procedure: BALLOON DILATION;  Surgeon: Mauri Pole, MD;  Location: [facility];  Service: Endoscopy;  Laterality: N/A;  . cataract     both eyes  . COLONOSCOPY    . COLONOSCOPY WITH PROPOFOL N/A 03/05/2017   Procedure: COLONOSCOPY WITH PROPOFOL;  Surgeon: Mauri Pole, MD;  Location: [facility];  Service: Endoscopy;  Laterality: N/A;  . DENTAL RESTORATION/EXTRACTION WITH X-RAY    . ESOPHAGOGASTRODUODENOSCOPY N/A 12/23/2012   Procedure: ESOPHAGOGASTRODUODENOSCOPY (EGD);  Surgeon: Inda Castle, MD;  Location: [facility];  Service: Endoscopy;  Laterality: N/A;  . ESOPHAGOGASTRODUODENOSCOPY N/A 01/15/2013   Procedure: ESOPHAGOGASTRODUODENOSCOPY (EGD);  Surgeon: Inda Castle, MD;  Location: [facility];  Service: Endoscopy;  Laterality: N/A;  . ESOPHAGOGASTRODUODENOSCOPY (EGD) WITH PROPOFOL N/A 07/22/2018   Procedure: ESOPHAGOGASTRODUODENOSCOPY (EGD) WITH PROPOFOL;  Surgeon: Mauri Pole, MD;  Location: [facility];  Service: Endoscopy;  Laterality: N/A;  . TONSILLECTOMY       OB History   No obstetric history on  file.     Family History  Problem Relation Age of Onset  . Hypertension Father   . Diabetes Father   . Cancer Father        melanoma  . Colon cancer Sister 28  . Cancer Sister        colon, surgery alone   . Early death Neg Hx   . Stroke Neg Hx   . Esophageal cancer Neg Hx   . Rectal cancer Neg Hx   . Stomach cancer Neg Hx     Social History   Tobacco Use  . Smoking status: Former Smoker    Packs/day:  1.00    Years: 55.00    Pack years: 55.00    Types: Cigarettes    Quit date: 10/19/2002    Years since quitting: 17.0  . Smokeless tobacco: Never Used  Substance Use Topics  . Alcohol use: No    Alcohol/week: 0.0 standard drinks  . Drug use: No    Home Medications Prior to Admission medications   Medication Sig Start Date End Date Taking? Authorizing Provider  acetaminophen (TYLENOL) 500 MG tablet Take 500 mg by mouth daily.    [provider]  albuterol (PROVENTIL HFA;VENTOLIN HFA) 108 (90 Base) MCG/ACT inhaler INHALE 2 PUFFS BY MOUTH INTO THE LUNGS EVERY 6 HOURS AS NEEDED FOR WHEEZING OR FOR [name] 07/15/18   [name], MD  aspirin EC 81 MG tablet Take 1 tablet (81 mg total) by mouth daily. 12/20/14   Rosalin Hawking, MD  Carboxymethylcellul-Glycerin (LUBRICATING EYE DROPS OP) Place 1 drop into both eyes daily as needed (dry eyes).    [provider]  Cholecalciferol (VITAMIN D3) 125 MCG (5000 UT) CAPS Take 5,000 Units by mouth daily.    [provider]  diphenhydrAMINE (BENADRYL) 25 MG tablet Take 25 mg by mouth daily as needed for allergies.    [provider]  Doxylamine Succinate, Sleep, (SLEEP AID PO) Take 1 tablet by mouth at bedtime as needed (sleep).    [provider]  esomeprazole (NEXIUM) 40 MG packet Take 40 mg by mouth 2 (two) times daily. 08/11/18   Mauri Pole, MD  gabapentin (NEURONTIN) 300 MG capsule TAKE 1 CAPSULE BY MOUTH THREE TIMES DAILY 05/28/19   [name], MD  HYDROcodone-acetaminophen (NORCO/VICODIN) 5-325 MG tablet Take 1 tablet by mouth every 8 (eight) hours as needed. 10/17/19   Virgel Manifold, MD  lansoprazole (PREVACID SOLUTAB) 30 MG disintegrating tablet Take 1 tablet (30 mg total) by mouth 2 (two) times daily. 08/11/18   Mauri Pole, MD  Magnesium Oxide 400 MG CAPS Take 1 capsule (400 mg total) by mouth daily. 07/07/19   [name], MD  pravastatin  (PRAVACHOL) 20 MG tablet Take 1 tablet by mouth once daily 07/08/19   [name], MD  promethazine-dextromethorphan (PROMETHAZINE-DM) 6.25-15 MG/5ML syrup Take 5 mLs by mouth 2 (two) times daily as needed for cough. 08/05/19   Binnie Rail, MD  traMADol (ULTRAM) 50 MG tablet TAKE 1 TABLET BY MOUTH ONCE DAILY AS NEEDED FOR MODERATE PAIN 09/14/19   [name], MD  TRELEGY ELLIPTA 100-62.5-25 MCG/INH AEPB INHALE 1 PUFF ONCE DAILY 09/14/19   [name], MD    Allergies    Lipitor [atorvastatin]  Review of Systems   Review of Systems All systems reviewed and negative, other than as noted in HPI.  Physical Exam Updated Vital Signs BP (!) 140/103 (BP Location: Left Arm)  Pulse 83   Temp 98.7 F (37.1 C) (Oral)   Resp 18   SpO2 91%   Physical Exam Vitals and nursing note reviewed.  Constitutional:      General: She is not in acute distress.    Appearance: She is well-developed.  HENT:     Head: Normocephalic and atraumatic.  Eyes:     General:        Right eye: No discharge.        Left eye: No discharge.     Conjunctiva/sclera: Conjunctivae normal.  Cardiovascular:     Rate and Rhythm: Normal rate and regular rhythm.     Heart sounds: Normal heart sounds. No murmur. No friction rub. No gallop.   Pulmonary:     Effort: Pulmonary effort is normal. No respiratory distress.     Breath sounds: Normal breath sounds.  Chest:     Chest wall: Tenderness present.     Comments: Subacute appearing ecchymosis to the right posterior lateral chest wall at approximately level the fifth or sixth rib.  Tenderness extending towards the right breast.  No hematoma.  No midline spinal tenderness.  Pain with range of motion of the shoulder but no discrete bony tenderness. Abdominal:     General: There is no distension.     Palpations: Abdomen is soft.     Tenderness: There is no abdominal tenderness.  Musculoskeletal:        General: No tenderness.     Cervical  back: Neck supple.  Skin:    General: Skin is warm and dry.  Neurological:     Mental Status: She is alert.  Psychiatric:        Behavior: Behavior normal.        Thought Content: Thought content normal.     ED Results / Procedures / Treatments   Labs (all labs ordered are listed, but only abnormal results are displayed) Labs Reviewed - No data to display  EKG None  Radiology DG Ribs Unilateral W/Chest Right  Result Date: 10/17/2019 CLINICAL DATA:  Status post fall with right rib pain. EXAM: RIGHT RIBS AND CHEST - 3+ VIEW COMPARISON:  None. FINDINGS: No fracture or dislocation are seen involving the ribs. There is no evidence of pneumothorax or pleural effusion. Both lungs are clear. Heart size and mediastinal contours are within normal limits. IMPRESSION: Negative. Electronically Signed   By: Abelardo Diesel M.D.   On: 10/17/2019 13:54   DG Shoulder Right  Result Date: 10/17/2019 CLINICAL DATA:  Status post fall with right shoulder pain. EXAM: RIGHT SHOULDER - 2+ VIEW COMPARISON:  None. FINDINGS: There is no evidence of fracture or dislocation. There is no evidence of arthropathy or other focal bone abnormality. Soft tissues are unremarkable. IMPRESSION: Negative. Electronically Signed   By: Abelardo Diesel M.D.   On: 10/17/2019 13:52    Procedures Procedures (including critical care time)  Medications Ordered in ED Medications  morphine 4 MG/ML injection 4 mg (4 mg Intramuscular Given 10/17/19 1230)  LORazepam (ATIVAN) tablet 0.5 mg (0.5 mg Oral Given 10/17/19 1229)    ED Course  I have reviewed the triage vital signs and the nursing notes.  Pertinent labs & imaging results that were available during my care of the patient were reviewed by me and considered in my medical decision making (see chart for details).    MDM Rules/Calculators/A&P  79 year old female with chest wall and shoulder pain after fall several days ago.  Imaging without acute osseous  abnormality.  No pneumothorax.  Prescribed Vicodin to take as needed for pain tramadol is not sufficient.  Cautioned about not taking them both concurrently.  Outpatient follow-up with PCP as needed.  Final Clinical Impression(s) / ED Diagnoses Final diagnoses:  Contusion of right chest wall, initial encounter    Rx / DC Orders ED Discharge Orders         Ordered    HYDROcodone-acetaminophen (NORCO/VICODIN) 5-325 MG tablet  Every 8 hours PRN     10/17/19 1417           Virgel Manifold, MD 10/17/19 1422

## 2019-10-21 ENCOUNTER — Other Ambulatory Visit: Payer: Self-pay | Admitting: Internal Medicine

## 2019-10-23 ENCOUNTER — Ambulatory Visit (INDEPENDENT_AMBULATORY_CARE_PROVIDER_SITE_OTHER): Payer: Medicare HMO | Admitting: Internal Medicine

## 2019-10-23 ENCOUNTER — Telehealth: Payer: Self-pay

## 2019-10-23 ENCOUNTER — Encounter: Payer: Self-pay | Admitting: Internal Medicine

## 2019-10-23 ENCOUNTER — Ambulatory Visit: Payer: Medicare HMO | Admitting: Family

## 2019-10-23 ENCOUNTER — Other Ambulatory Visit: Payer: Self-pay

## 2019-10-23 DIAGNOSIS — R0781 Pleurodynia: Secondary | ICD-10-CM | POA: Diagnosis not present

## 2019-10-23 DIAGNOSIS — J42 Unspecified chronic bronchitis: Secondary | ICD-10-CM

## 2019-10-23 MED ORDER — HYDROCODONE-ACETAMINOPHEN 5-325 MG PO TABS: 1.0000 | ORAL_TABLET | Freq: Three times a day (TID) | ORAL | 0 refills | Status: DC | PRN

## 2019-10-23 MED ORDER — ALBUTEROL SULFATE HFA 108 (90 BASE) MCG/ACT IN AERS: 1.0000 | INHALATION_SPRAY | RESPIRATORY_TRACT | 6 refills | Status: DC | PRN

## 2019-10-23 NOTE — Telephone Encounter (Signed)
Message sent to [facility].

## 2019-10-23 NOTE — Telephone Encounter (Signed)
Called and spoke with patient today. She stated that she was having trouble breathing and had recently taken a fall. Patient has been advised since she was having trouble breathing and was hurting whenever she took a breath in and out. Patient advised to go to ED or Urgent Care to be seen and evaluated today. Patient voiced understanding and said she would go to an UC for further treatment.

## 2019-10-23 NOTE — Telephone Encounter (Signed)
Nebulizer ordered, please let advanced know. Albuterol refilled as well. Per our conversation hydrocodone is not something which can be prescribed without visit per [place] law.

## 2019-10-23 NOTE — Telephone Encounter (Signed)
Addressed in other message.

## 2019-10-23 NOTE — Assessment & Plan Note (Signed)
Rx hydrocodone after review of [place] controlled substance database. Advised to not take with tramadol concurrently. Use albuterol scheduled BID and prn also.

## 2019-10-23 NOTE — Progress Notes (Signed)
Virtual Visit via Video Note  I connected with Sherry Lambert on 10/23/19 at  4:00 PM EST by a video enabled telemedicine application and verified that I am speaking with the correct person using two identifiers.  The patient and the provider were at separate locations throughout the entire encounter.   I discussed the limitations of evaluation and management by telemedicine and the availability of in person appointments. The patient expressed understanding and agreed to proceed. The patient and the provider were the only parties present for the visit unless noted in HPI below.  History of Present Illness: The patient is a 79 y.o. female with visit for fall about 2 weeks ago. Went to ER on 10/17/19 and had x-rays without rib fracture or pneumonia. She was given short supply of hydrocodone which has been helping well with the pain. She is using half pill at a time. Not taking tramadol at the same time. Does have broken nebulizer at this time and ordered new one for her today. She needed refill of albuterol inhaler which was also sent in earlier today. Denies cough or sputum recently. Just hard to breathe when she is hurting. This is worst in the morning. Overall it is stable to mild improvement.   Observations/Objective: Appearance: normal, breathing appears normal, casual grooming, abdomen does not appear distended, throat normal, memory normal, mental status is A and O times 3  Assessment and Plan: See problem oriented charting  Follow Up Instructions: rx hydrocodone, reminded not to take with tramadol, call back if breathing not improving with albuterol usage  I discussed the assessment and treatment plan with the patient. The patient was provided an opportunity to ask questions and all were answered. The patient agreed with the plan and demonstrated an understanding of the instructions.   The patient was advised to call back or seek an in-person evaluation if the symptoms worsen or if the  condition fails to improve as anticipated.  [name], MD

## 2019-10-27 ENCOUNTER — Encounter (HOSPITAL_COMMUNITY): Payer: Self-pay

## 2019-10-27 ENCOUNTER — Inpatient Hospital Stay (HOSPITAL_COMMUNITY)
Admission: EM | Admit: 2019-10-27 | Discharge: 2019-10-29 | DRG: 193 | Disposition: A | Discharge status: Home Health | Payer: Medicare HMO | Attending: Internal Medicine | Admitting: Internal Medicine

## 2019-10-27 ENCOUNTER — Emergency Department (HOSPITAL_COMMUNITY): Payer: Medicare HMO

## 2019-10-27 ENCOUNTER — Other Ambulatory Visit: Payer: Self-pay

## 2019-10-27 DIAGNOSIS — C19 Malignant neoplasm of rectosigmoid junction: Secondary | ICD-10-CM | POA: Diagnosis present

## 2019-10-27 DIAGNOSIS — J189 Pneumonia, unspecified organism: Principal | ICD-10-CM | POA: Diagnosis present

## 2019-10-27 DIAGNOSIS — R05 Cough: Secondary | ICD-10-CM | POA: Diagnosis not present

## 2019-10-27 DIAGNOSIS — J441 Chronic obstructive pulmonary disease with (acute) exacerbation: Secondary | ICD-10-CM | POA: Diagnosis not present

## 2019-10-27 DIAGNOSIS — R062 Wheezing: Secondary | ICD-10-CM | POA: Diagnosis not present

## 2019-10-27 DIAGNOSIS — Z9842 Cataract extraction status, left eye: Secondary | ICD-10-CM

## 2019-10-27 DIAGNOSIS — B370 Candidal stomatitis: Secondary | ICD-10-CM | POA: Diagnosis present

## 2019-10-27 DIAGNOSIS — Z800 Family history of malignant neoplasm of digestive organs: Secondary | ICD-10-CM

## 2019-10-27 DIAGNOSIS — Z808 Family history of malignant neoplasm of other organs or systems: Secondary | ICD-10-CM

## 2019-10-27 DIAGNOSIS — R0602 Shortness of breath: Secondary | ICD-10-CM | POA: Diagnosis not present

## 2019-10-27 DIAGNOSIS — Z9089 Acquired absence of other organs: Secondary | ICD-10-CM

## 2019-10-27 DIAGNOSIS — R231 Pallor: Secondary | ICD-10-CM | POA: Diagnosis not present

## 2019-10-27 DIAGNOSIS — R1310 Dysphagia, unspecified: Secondary | ICD-10-CM

## 2019-10-27 DIAGNOSIS — E785 Hyperlipidemia, unspecified: Secondary | ICD-10-CM | POA: Diagnosis not present

## 2019-10-27 DIAGNOSIS — I10 Essential (primary) hypertension: Secondary | ICD-10-CM | POA: Diagnosis not present

## 2019-10-27 DIAGNOSIS — E118 Type 2 diabetes mellitus with unspecified complications: Secondary | ICD-10-CM | POA: Diagnosis not present

## 2019-10-27 DIAGNOSIS — E1169 Type 2 diabetes mellitus with other specified complication: Secondary | ICD-10-CM | POA: Diagnosis present

## 2019-10-27 DIAGNOSIS — Z8249 Family history of ischemic heart disease and other diseases of the circulatory system: Secondary | ICD-10-CM

## 2019-10-27 DIAGNOSIS — Z79899 Other long term (current) drug therapy: Secondary | ICD-10-CM

## 2019-10-27 DIAGNOSIS — Z833 Family history of diabetes mellitus: Secondary | ICD-10-CM

## 2019-10-27 DIAGNOSIS — I69398 Other sequelae of cerebral infarction: Secondary | ICD-10-CM

## 2019-10-27 DIAGNOSIS — Z66 Do not resuscitate: Secondary | ICD-10-CM | POA: Diagnosis present

## 2019-10-27 DIAGNOSIS — Z888 Allergy status to other drugs, medicaments and biological substances status: Secondary | ICD-10-CM

## 2019-10-27 DIAGNOSIS — I69351 Hemiplegia and hemiparesis following cerebral infarction affecting right dominant side: Secondary | ICD-10-CM

## 2019-10-27 DIAGNOSIS — Z8719 Personal history of other diseases of the digestive system: Secondary | ICD-10-CM

## 2019-10-27 DIAGNOSIS — M8580 Other specified disorders of bone density and structure, unspecified site: Secondary | ICD-10-CM | POA: Diagnosis present

## 2019-10-27 DIAGNOSIS — R1319 Other dysphagia: Secondary | ICD-10-CM

## 2019-10-27 DIAGNOSIS — Z9841 Cataract extraction status, right eye: Secondary | ICD-10-CM

## 2019-10-27 DIAGNOSIS — J9601 Acute respiratory failure with hypoxia: Secondary | ICD-10-CM | POA: Diagnosis present

## 2019-10-27 DIAGNOSIS — J439 Emphysema, unspecified: Secondary | ICD-10-CM | POA: Diagnosis present

## 2019-10-27 DIAGNOSIS — Z209 Contact with and (suspected) exposure to unspecified communicable disease: Secondary | ICD-10-CM | POA: Diagnosis not present

## 2019-10-27 DIAGNOSIS — Z972 Presence of dental prosthetic device (complete) (partial): Secondary | ICD-10-CM

## 2019-10-27 DIAGNOSIS — Z87891 Personal history of nicotine dependence: Secondary | ICD-10-CM

## 2019-10-27 DIAGNOSIS — E1151 Type 2 diabetes mellitus with diabetic peripheral angiopathy without gangrene: Secondary | ICD-10-CM | POA: Diagnosis present

## 2019-10-27 DIAGNOSIS — Z7982 Long term (current) use of aspirin: Secondary | ICD-10-CM

## 2019-10-27 DIAGNOSIS — Z20822 Contact with and (suspected) exposure to covid-19: Secondary | ICD-10-CM | POA: Diagnosis not present

## 2019-10-27 DIAGNOSIS — Z9181 History of falling: Secondary | ICD-10-CM

## 2019-10-27 DIAGNOSIS — Z90710 Acquired absence of both cervix and uterus: Secondary | ICD-10-CM

## 2019-10-27 DIAGNOSIS — K219 Gastro-esophageal reflux disease without esophagitis: Secondary | ICD-10-CM | POA: Diagnosis present

## 2019-10-27 DIAGNOSIS — Z87442 Personal history of urinary calculi: Secondary | ICD-10-CM

## 2019-10-27 LAB — URINALYSIS, ROUTINE W REFLEX MICROSCOPIC
Bilirubin Urine: NEGATIVE
Glucose, UA: NEGATIVE mg/dL
Ketones, ur: NEGATIVE mg/dL
Nitrite: NEGATIVE
Protein, ur: NEGATIVE mg/dL
Specific Gravity, Urine: 1.011 (ref 1.005–1.030)
pH: 6 (ref 5.0–8.0)

## 2019-10-27 LAB — COMPREHENSIVE METABOLIC PANEL
ALT: 14 U/L (ref 0–44)
AST: 17 U/L (ref 15–41)
Albumin: 3.5 g/dL (ref 3.5–5.0)
Alkaline Phosphatase: 74 U/L (ref 38–126)
Anion gap: 10 (ref 5–15)
BUN: 11 mg/dL (ref 8–23)
CO2: 25 mmol/L (ref 22–32)
Calcium: 9.1 mg/dL (ref 8.9–10.3)
Chloride: 105 mmol/L (ref 98–111)
Creatinine, Ser: 0.73 mg/dL (ref 0.44–1.00)
GFR calc Af Amer: >60 mL/min (ref >60)
GFR calc non Af Amer: >60 mL/min (ref >60)
Glucose, Bld: 157 mg/dL — ABNORMAL HIGH (ref 70–99)
Potassium: 3.7 mmol/L (ref 3.5–5.1)
Sodium: 140 mmol/L (ref 135–145)
Total Bilirubin: 0.5 mg/dL (ref 0.3–1.2)
Total Protein: 7.2 g/dL (ref 6.5–8.1)

## 2019-10-27 LAB — CBC WITH DIFFERENTIAL/PLATELET
Abs Immature Granulocytes: 0.03 10*3/uL (ref 0.00–0.07)
Basophils Absolute: 0 10*3/uL (ref 0.0–0.1)
Basophils Relative: 0 %
Eosinophils Absolute: 0 10*3/uL (ref 0.0–0.5)
Eosinophils Relative: 0 %
HCT: 45 % (ref 36.0–46.0)
Hemoglobin: 13.7 g/dL (ref 12.0–15.0)
Immature Granulocytes: 0 %
Lymphocytes Relative: 5 %
Lymphs Abs: 0.6 10*3/uL — ABNORMAL LOW (ref 0.7–4.0)
MCH: 29.2 pg (ref 26.0–34.0)
MCHC: 30.4 g/dL (ref 30.0–36.0)
MCV: 95.9 fL (ref 80.0–100.0)
Monocytes Absolute: 0.2 10*3/uL (ref 0.1–1.0)
Monocytes Relative: 2 %
Neutro Abs: 11.1 10*3/uL — ABNORMAL HIGH (ref 1.7–7.7)
Neutrophils Relative %: 93 %
Platelets: 315 10*3/uL (ref 150–400)
RBC: 4.69 MIL/uL (ref 3.87–5.11)
RDW: 12.9 % (ref 11.5–15.5)
WBC: 12 10*3/uL — ABNORMAL HIGH (ref 4.0–10.5)
nRBC: 0 % (ref 0.0–0.2)

## 2019-10-27 LAB — LIPASE, BLOOD: Lipase: 22 U/L (ref 11–51)

## 2019-10-27 LAB — PROTIME-INR
INR: 1 (ref 0.8–1.2)
Prothrombin Time: 12.6 seconds (ref 11.4–15.2)

## 2019-10-27 LAB — GLUCOSE, CAPILLARY: Glucose-Capillary: 301 mg/dL — ABNORMAL HIGH (ref 70–99)

## 2019-10-27 LAB — BRAIN NATRIURETIC PEPTIDE: B Natriuretic Peptide: 451.1 pg/mL — ABNORMAL HIGH (ref 0.0–100.0)

## 2019-10-27 LAB — HEMOGLOBIN A1C
Hgb A1c MFr Bld: 6.5 % — ABNORMAL HIGH (ref 4.8–5.6)
Mean Plasma Glucose: 139.85 mg/dL

## 2019-10-27 LAB — LACTIC ACID, PLASMA
Lactic Acid, Venous: 1 mmol/L (ref 0.5–1.9)
Lactic Acid, Venous: 0.9 mmol/L (ref 0.5–1.9)

## 2019-10-27 LAB — POC SARS CORONAVIRUS 2 AG -  ED: SARS Coronavirus 2 Ag: NEGATIVE

## 2019-10-27 LAB — TROPONIN I (HIGH SENSITIVITY)
Troponin I (High Sensitivity): 26 ng/L — ABNORMAL HIGH (ref <18)
Troponin I (High Sensitivity): 20 ng/L — ABNORMAL HIGH (ref <18)

## 2019-10-27 MED ORDER — IOHEXOL 350 MG/ML SOLN
100.0000 mL | Freq: Once | INTRAVENOUS | Status: AC | PRN
  Administered 2019-10-27: 100 mL via INTRAVENOUS

## 2019-10-27 MED ORDER — SODIUM CHLORIDE 0.9 % IV SOLN
2.0000 g | INTRAVENOUS | Status: DC
  Administered 2019-10-28: 2 g via INTRAVENOUS
  Filled 2019-10-27: qty 2
  Filled 2019-10-27: qty 20

## 2019-10-27 MED ORDER — ENOXAPARIN SODIUM 40 MG/0.4ML ~~LOC~~ SOLN
40.0000 mg | SUBCUTANEOUS | Status: DC
  Administered 2019-10-27 – 2019-10-28 (×2): 40 mg via SUBCUTANEOUS
  Filled 2019-10-27 (×2): qty 0.4

## 2019-10-27 MED ORDER — GABAPENTIN 300 MG PO CAPS
300.0000 mg | ORAL_CAPSULE | Freq: Three times a day (TID) | ORAL | Status: DC
  Administered 2019-10-27 – 2019-10-29 (×5): 300 mg via ORAL
  Filled 2019-10-27 (×5): qty 1

## 2019-10-27 MED ORDER — PRAVASTATIN SODIUM 20 MG PO TABS
20.0000 mg | ORAL_TABLET | Freq: Every day | ORAL | Status: DC
  Administered 2019-10-27 – 2019-10-29 (×3): 20 mg via ORAL
  Filled 2019-10-27 (×3): qty 1

## 2019-10-27 MED ORDER — FLUTICASONE-UMECLIDIN-VILANT 100-62.5-25 MCG/INH IN AEPB: 1.0000 | INHALATION_SPRAY | Freq: Every day | RESPIRATORY_TRACT | Status: DC

## 2019-10-27 MED ORDER — SODIUM CHLORIDE 0.9 % IV SOLN
1.0000 g | Freq: Once | INTRAVENOUS | Status: AC
  Administered 2019-10-27: 1 g via INTRAVENOUS
  Filled 2019-10-27: qty 10

## 2019-10-27 MED ORDER — SODIUM CHLORIDE (PF) 0.9 % IJ SOLN
INTRAMUSCULAR | Status: AC
  Filled 2019-10-27: qty 50

## 2019-10-27 MED ORDER — DIPHENHYDRAMINE HCL 25 MG PO CAPS: 25.0000 mg | ORAL_CAPSULE | Freq: Every day | ORAL | Status: DC | PRN

## 2019-10-27 MED ORDER — NYSTATIN 100000 UNIT/ML MT SUSP
5.0000 mL | Freq: Four times a day (QID) | OROMUCOSAL | Status: DC
  Administered 2019-10-27 – 2019-10-29 (×5): 500000 [IU] via ORAL
  Filled 2019-10-27 (×7): qty 5

## 2019-10-27 MED ORDER — ASPIRIN EC 81 MG PO TBEC
81.0000 mg | DELAYED_RELEASE_TABLET | Freq: Every day | ORAL | Status: DC
  Administered 2019-10-27 – 2019-10-29 (×3): 81 mg via ORAL
  Filled 2019-10-27 (×3): qty 1

## 2019-10-27 MED ORDER — TRAMADOL HCL 50 MG PO TABS
50.0000 mg | ORAL_TABLET | Freq: Every day | ORAL | Status: DC | PRN
  Administered 2019-10-29: 50 mg via ORAL
  Filled 2019-10-27: qty 1

## 2019-10-27 MED ORDER — IPRATROPIUM-ALBUTEROL 0.5-2.5 (3) MG/3ML IN SOLN: 3.0000 mL | RESPIRATORY_TRACT | Status: DC | PRN

## 2019-10-27 MED ORDER — AZITHROMYCIN 250 MG PO TABS
500.0000 mg | ORAL_TABLET | Freq: Once | ORAL | Status: AC
  Administered 2019-10-27: 500 mg via ORAL
  Filled 2019-10-27: qty 2

## 2019-10-27 MED ORDER — IPRATROPIUM-ALBUTEROL 0.5-2.5 (3) MG/3ML IN SOLN
3.0000 mL | Freq: Once | RESPIRATORY_TRACT | Status: AC
  Administered 2019-10-27: 3 mL via RESPIRATORY_TRACT
  Filled 2019-10-27: qty 3

## 2019-10-27 MED ORDER — INSULIN ASPART 100 UNIT/ML ~~LOC~~ SOLN
0.0000 [IU] | Freq: Three times a day (TID) | SUBCUTANEOUS | Status: DC
  Administered 2019-10-28: 2 [IU] via SUBCUTANEOUS

## 2019-10-27 MED ORDER — VITAMIN D3 25 MCG (1000 UNIT) PO TABS
5000.0000 [IU] | ORAL_TABLET | Freq: Every day | ORAL | Status: DC
  Administered 2019-10-27 – 2019-10-29 (×3): 5000 [IU] via ORAL
  Filled 2019-10-27 (×3): qty 5

## 2019-10-27 MED ORDER — UMECLIDINIUM BROMIDE 62.5 MCG/INH IN AEPB
1.0000 | INHALATION_SPRAY | Freq: Every day | RESPIRATORY_TRACT | Status: DC
  Administered 2019-10-28 – 2019-10-29 (×2): 1 via RESPIRATORY_TRACT
  Filled 2019-10-27: qty 7

## 2019-10-27 MED ORDER — FLUTICASONE FUROATE-VILANTEROL 200-25 MCG/INH IN AEPB
1.0000 | INHALATION_SPRAY | Freq: Every day | RESPIRATORY_TRACT | Status: DC
  Administered 2019-10-28 – 2019-10-29 (×2): 1 via RESPIRATORY_TRACT
  Filled 2019-10-27: qty 28

## 2019-10-27 MED ORDER — AZITHROMYCIN 250 MG PO TABS
500.0000 mg | ORAL_TABLET | Freq: Every day | ORAL | Status: DC
  Administered 2019-10-28 – 2019-10-29 (×2): 500 mg via ORAL
  Filled 2019-10-27 (×2): qty 2

## 2019-10-27 MED ORDER — HYDROCODONE-ACETAMINOPHEN 5-325 MG PO TABS
1.0000 | ORAL_TABLET | Freq: Three times a day (TID) | ORAL | Status: DC | PRN
  Administered 2019-10-28: 1 via ORAL
  Filled 2019-10-27: qty 1

## 2019-10-27 NOTE — ED Notes (Signed)
Pt provided with graham crackers and coke at this time. Pt updated on plank of care

## 2019-10-27 NOTE — ED Triage Notes (Signed)
Pt arrives [facility] from home with complaints of SHOB since Thursday- Pt reports worsening SHOB in last 2 days. Pt prescribed MDI on Friday. Pt is afebrile. Denies COVID exposure. Pt reports congestion and non productive cough. EMS administered 125mg  Solu-Medrol IV. Pt 97% on RA. Pt reports worsening dyspnea on exertion

## 2019-10-27 NOTE — Telephone Encounter (Signed)
I have sent a community message to [employer] for an update.

## 2019-10-27 NOTE — ED Provider Notes (Addendum)
[facility] DEPT Provider Note   CSN: [id number] Arrival date & time: 10/27/19  0957     History Chief Complaint  Patient presents with  . [name]    Sherry Lambert is a 79 y.o. female.  HPI Patient had a mechanical fall 2 weeks ago.  She reports she injured the right side of her body.  She had a lot of bruising and pain on her arm and the right side of her chest.  At first she thought that she was all right and she would just shake it off.  She however continued to have a lot of pain and difficulty with pain on the right chest wall.  She did go to the emergency department 2/13 and was evaluated by chest x-ray and shoulder x-ray.  No acute fractures identified.  Patient reports since that time she has continued to have a lot of pain on the right side.  She has also now become increasingly short of breath.  She reports that she started to get a bit of cough a few days ago.  She has not had a fever but the shortness of breath is getting progressively worse.  Pain has not really improved much.  No nausea no vomiting no diarrhea.  Patient does have COPD.  She regularly takes her inhalers.  At baseline however she reports her symptoms are well controlled.  She does not typically have chronic shortness of breath.  She is an ex-smoker.    Past Medical History:  Diagnosis Date  . 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 [name]   . Cataract   . Chronic kidney disease    kidney stone  . Colitis   . Colon cancer ([place]) 06/2017   cecum  . Diabetes mellitus without complication ([place])    no meds taken  . Emphysema of lung ([place])   . GERD (gastroesophageal reflux disease)   . Hyperlipidemia   . Hypertension   . Peripheral arterial disease ([place])   . [name]   . Stroke [facility])    November 2016- Thanksgiving night    Patient Active Problem List   Diagnosis Date Noted  . Rib pain 10/23/2019  . Muscle cramps 07/07/2019  .  Esophageal dysphagia   . Venous insufficiency 11/29/2017  . Urinary incontinence 11/29/2017  . 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 Cecum mass   . Mass in rectum   . PVD (peripheral vascular disease) ([place]) 09/14/2016  . COPD (chronic obstructive pulmonary disease) ([place]) 12/01/2015  . Essential hypertension 12/20/2014  . Routine general medical examination at a health care facility 10/22/2014  . 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 Peripheral arterial disease ([place]) 01/05/2014  . Osteopenia 11/12/2013  . Hyperlipidemia associated with type 2 diabetes mellitus ([place]) 09/19/2013  . Esophageal stricture 12/23/2012  . 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 ABDOMINAL HYSTERECTOMY    . BALLOON DILATION N/A 12/23/2012   Procedure: BALLOON DILATION;  Surgeon: Inda Castle, MD;  Location: [facility];  Service: Endoscopy;  Laterality: N/A;  . BALLOON DILATION N/A 01/15/2013   Procedure: BALLOON DILATION;  Surgeon: Inda Castle, MD;  Location: [facility];  Service: Endoscopy;  Laterality: N/A;  . BALLOON DILATION N/A 07/22/2018   Procedure: BALLOON DILATION;  Surgeon: Mauri Pole, MD;  Location: [facility];  Service:  Endoscopy;  Laterality: N/A;  . cataract     both eyes  . COLONOSCOPY    . COLONOSCOPY WITH PROPOFOL N/A 03/05/2017   Procedure: COLONOSCOPY WITH PROPOFOL;  Surgeon: Mauri Pole, MD;  Location: [facility];  Service: Endoscopy;  Laterality: N/A;  . DENTAL RESTORATION/EXTRACTION WITH X-RAY    . ESOPHAGOGASTRODUODENOSCOPY N/A 12/23/2012   Procedure: ESOPHAGOGASTRODUODENOSCOPY (EGD);  Surgeon: Inda Castle, MD;  Location: [facility];  Service: Endoscopy;  Laterality: N/A;  . ESOPHAGOGASTRODUODENOSCOPY N/A 01/15/2013   Procedure: ESOPHAGOGASTRODUODENOSCOPY (EGD);  Surgeon: Inda Castle, MD;   Location: [facility];  Service: Endoscopy;  Laterality: N/A;  . ESOPHAGOGASTRODUODENOSCOPY (EGD) WITH PROPOFOL N/A 07/22/2018   Procedure: ESOPHAGOGASTRODUODENOSCOPY (EGD) WITH PROPOFOL;  Surgeon: Mauri Pole, MD;  Location: [facility];  Service: Endoscopy;  Laterality: N/A;  . TONSILLECTOMY       OB History   No obstetric history on file.     Family History  Problem Relation Age of Onset  . Hypertension Father   . Diabetes Father   . Cancer Father        melanoma  . Colon cancer Sister 72  . Cancer Sister        colon, surgery alone   . Early death Neg Hx   . Stroke Neg Hx   . Esophageal cancer Neg Hx   . Rectal cancer Neg Hx   . Stomach cancer Neg Hx     Social History   Tobacco Use  . Smoking status: Former Smoker    Packs/day: 1.00    Years: 55.00    Pack years: 55.00    Types: Cigarettes    Quit date: 10/19/2002    Years since quitting: 17.0  . Smokeless tobacco: Never Used  Substance Use Topics  . Alcohol use: No    Alcohol/week: 0.0 standard drinks  . Drug use: No    Home Medications Prior to Admission medications   Medication Sig Start Date End Date Taking? Authorizing Provider  acetaminophen (TYLENOL) 500 MG tablet Take 500 mg by mouth daily.   Yes [provider]  albuterol (VENTOLIN HFA) 108 (90 Base) MCG/ACT inhaler Inhale 1-2 puffs into the lungs every 4 (four) hours as needed for wheezing or shortness of breath. 10/23/19  Yes [name], MD  aspirin EC 81 MG tablet Take 1 tablet (81 mg total) by mouth daily. 12/20/14  Yes Rosalin Hawking, MD  Carboxymethylcellul-Glycerin (LUBRICATING EYE DROPS OP) Place 1 drop into both eyes daily as needed (dry eyes).   Yes [provider]  Cholecalciferol (VITAMIN D3) 125 MCG (5000 UT) CAPS Take 5,000 Units by mouth daily.   Yes [provider]  diphenhydrAMINE (BENADRYL) 25 MG tablet Take 25 mg by mouth daily as needed for allergies.   Yes [provider]    Doxylamine Succinate, Sleep, (SLEEP AID PO) Take 1 tablet by mouth at bedtime as needed (sleep).   Yes [provider]  gabapentin (NEURONTIN) 300 MG capsule TAKE 1 CAPSULE BY MOUTH THREE TIMES DAILY Patient taking differently: Take 300 mg by mouth 3 (three) times daily.  05/28/19  Yes [name], MD  HYDROcodone-acetaminophen (NORCO/VICODIN) 5-325 MG tablet Take 1 tablet by mouth every 8 (eight) hours as needed. 10/23/19  Yes [name], MD  pravastatin (PRAVACHOL) 20 MG tablet Take 1 tablet by mouth once daily 10/21/19  Yes [name], MD  promethazine-dextromethorphan (PROMETHAZINE-DM) 6.25-15 MG/5ML syrup Take 5 mLs by mouth 2 (two) times daily as  needed for cough. 08/05/19  Yes Burns, Claudina Lick, MD  traMADol (ULTRAM) 50 MG tablet TAKE 1 TABLET BY MOUTH ONCE DAILY AS NEEDED FOR MODERATE PAIN Patient taking differently: Take 50 mg by mouth daily as needed for moderate pain.  09/14/19  Yes [name], MD  TRELEGY ELLIPTA 100-62.5-25 MCG/INH AEPB Inhale 1 puff by mouth once daily Patient taking differently: Inhale 1 puff into the lungs daily at 12 noon.  10/21/19  Yes [name], MD  esomeprazole (NEXIUM) 40 MG packet Take 40 mg by mouth 2 (two) times daily. Patient not taking: Reported on 10/27/2019 08/11/18   Mauri Pole, MD  lansoprazole (PREVACID SOLUTAB) 30 MG disintegrating tablet Take 1 tablet (30 mg total) by mouth 2 (two) times daily. 08/11/18   Mauri Pole, MD  Magnesium Oxide 400 MG CAPS Take 1 capsule (400 mg total) by mouth daily. Patient not taking: Reported on 10/27/2019 07/07/19   [name], MD    Allergies    Lipitor [atorvastatin]  Review of Systems   Review of Systems 10 Systems reviewed and are negative for acute change except as noted in the HPI.   Physical Exam Updated Vital Signs BP (!) 134/59   Pulse 87   Temp 98.5 F (36.9 C) (Oral)   Resp (!) 21   Wt 57.6 kg   SpO2 98%   BMI  25.65 kg/m   Physical Exam Constitutional:      Comments: Alert and nontoxic.  Status clear.  Patient appears slightly uncomfortable.  Tachypnea.  HENT:     Head: Normocephalic and atraumatic.  Eyes:     Extraocular Movements: Extraocular movements intact.  Cardiovascular:     Rate and Rhythm: Normal rate and regular rhythm.     Pulses: Normal pulses.  Pulmonary:     Comments: Tachypnea.  Some crackles on the left lung fields, soft.  Breath sounds slightly softer in the right base.  Some scattered crackles.  Patient has resolving brownish-yellow ecchymoses over the right chest wall down at the lower ribs wrapping around the flank toward the anterior chest wall.  Also resolving ecchymoses on the right upper arm. Abdominal:     General: There is no distension.     Palpations: Abdomen is soft.     Tenderness: There is no abdominal tenderness. There is no guarding.  Musculoskeletal:        General: Normal range of motion.     Cervical back: Neck supple.     Comments: Brownish-yellow ecchymoses of most of the right upper arm.  Range of motion is intact.  Still uncomfortable.  No deformities.  Lower extremities no calf tenderness.  No significant peripheral edema.  Feet are warm and dry.  Skin:    General: Skin is warm and dry.  Neurological:     General: No focal deficit present.     Mental Status: She is oriented to person, place, and time.     Coordination: Coordination normal.  Psychiatric:        Mood and Affect: Mood normal.     ED Results / Procedures / Treatments   Labs (all labs ordered are listed, but only abnormal results are displayed) Labs Reviewed  COMPREHENSIVE METABOLIC PANEL - Abnormal; Notable for the following components:      Result Value   Glucose, Bld 157 (*)    All other components within normal limits  BRAIN NATRIURETIC PEPTIDE - Abnormal; Notable for the following components:   B Natriuretic  Peptide 451.1 (*)    All other components within normal  limits  CBC WITH DIFFERENTIAL/PLATELET - Abnormal; Notable for the following components:   WBC 12.0 (*)    Neutro Abs 11.1 (*)    Lymphs Abs 0.6 (*)    All other components within normal limits  URINALYSIS, ROUTINE W REFLEX MICROSCOPIC - Abnormal; Notable for the following components:   APPearance HAZY (*)    Hgb urine dipstick SMALL (*)    Leukocytes,Ua TRACE (*)    Bacteria, UA FEW (*)    All other components within normal limits  TROPONIN I (HIGH SENSITIVITY) - Abnormal; Notable for the following components:   Troponin I (High Sensitivity) 26 (*)    All other components within normal limits  TROPONIN I (HIGH SENSITIVITY) - Abnormal; Notable for the following components:   Troponin I (High Sensitivity) 20 (*)    All other components within normal limits  LIPASE, BLOOD  LACTIC ACID, PLASMA  LACTIC ACID, PLASMA  PROTIME-INR  POC SARS CORONAVIRUS 2 AG -  ED    EKG EKG Interpretation  Date/Time:  Tuesday October 27 2019 10:44:34 EST Ventricular Rate:  79 PR Interval:    QRS Duration: 79 QT Interval:  413 QTC Calculation: 474 R Axis:   72 Text Interpretation: Sinus rhythm slight increased ST segmant V2 V3 with poor r wave progression compared to old. otherwise unchanged Confirmed by Charlesetta Shanks [phone]) on 10/27/2019 3:23:16 PM   Radiology CT Angio Chest PE W/Cm &/Or Wo Cm  Result Date: 10/27/2019 CLINICAL DATA:  [name] EXAM: CT ANGIOGRAPHY CHEST WITH CONTRAST TECHNIQUE: Multidetector CT imaging of the chest was performed using the standard protocol during bolus administration of intravenous contrast. Multiplanar CT image reconstructions and MIPs were obtained to evaluate the vascular anatomy. CONTRAST:  130mL OMNIPAQUE IOHEXOL 350 MG/ML SOLN COMPARISON:  None. FINDINGS: Cardiovascular: There is a optimal opacification of the pulmonary arteries. There is no central,segmental, or subsegmental filling defects within the pulmonary arteries. There is mild  cardiomegaly. Trace pericardial effusion is seen. No evidence of right ventricular heart strain. Coronary artery calcifications are seen. There is normal three-vessel brachiocephalic anatomy without proximal stenosis. Scattered aortic atherosclerosis is noted. There is atherosclerosis at the origin of the great vessels. Mediastinum/Nodes: No hilar, mediastinal, or axillary adenopathy. Thyroid gland, trachea, and esophagus demonstrate no significant findings. Lungs/Pleura: Centrilobular emphysematous changes are seen predominantly within both upper lungs. There are small bilateral pleural effusions, left greater than right. There is patchy airspace opacity seen at the periphery of the left lung base. Upper Abdomen: A moderate hiatal hernia is present. Musculoskeletal: No chest wall abnormality. No acute or significant osseous findings. Chronic anterior wedge compression deformities of the T12 and L1 vertebral bodies are seen with approximately 25-30% loss of vertebral body height. Review of the MIP images confirms the above findings. IMPRESSION: 1. No central, segmental, or subsegmental pulmonary embolism. 2. Small bilateral pleural effusions, left greater than right. 3. Patchy airspace opacity seen at the left lung base which could be due to compressive atelectasis or early infectious etiology. 4. Trace pericardial effusion 5. Mild cardiomegaly 6. Moderate hiatal hernia 7.  Aortic Atherosclerosis (ICD10-I70.0). 8.  Emphysema (ICD10-J43.9). Electronically Signed   By: Prudencio Pair M.D.   On: 10/27/2019 15:59   DG Chest Port 1 View  Result Date: 10/27/2019 CLINICAL DATA:  [name], cough EXAM: PORTABLE CHEST 1 VIEW COMPARISON:  10/17/2019 FINDINGS: Stable mild cardiomegaly. Calcific aortic knob. Interval development of left lower lobe  airspace consolidation. Right lung is clear. Possible small left pleural effusion. No pneumothorax. IMPRESSION: Interval development of left lower lobe airspace  consolidation, suspicious for pneumonia. Electronically Signed   By: Davina Poke D.O.   On: 10/27/2019 11:25    Procedures Procedures (including critical care time)  Medications Ordered in ED Medications  sodium chloride (PF) 0.9 % injection (has no administration in time range)  cefTRIAXone (ROCEPHIN) 1 g in sodium chloride 0.9 % 100 mL IVPB (has no administration in time range)  azithromycin (ZITHROMAX) tablet 500 mg (has no administration in time range)  ipratropium-albuterol (DUONEB) 0.5-2.5 (3) MG/3ML nebulizer solution 3 mL (has no administration in time range)  iohexol (OMNIPAQUE) 350 MG/ML injection 100 mL (100 mLs Intravenous Contrast Given 10/27/19 1516)    ED Course  I have reviewed the triage vital signs and the nursing notes.  Pertinent labs & imaging results that were available during my care of the patient were reviewed by me and considered in my medical decision making (see chart for details).    MDM Rules/Calculators/A&P                      Consult:Dr. Teryl Lucy accepts for admission Patient has had increasing shortness of breath after a fall 2 weeks ago.  CT PE study does not show pulmonary embolus but patient does have consolidation in the left lower lobe with intermittent development of cough and hypoxia.  Likely this is also exacerbated by underlying COPD.  Patient is compliant with inhalers but is experiencing increasing shortness of breath.  I did remove her oxygen and she trended down to the lower 90s.  Patient felt significantly improved on 2 L of oxygen which maintained her in the high 90s.  At this time with increasing shortness of breath and developing consolidation plan for admission for community acquired pneumonia with likely associated COPD exacerbation. Final Clinical Impression(s) / ED Diagnoses Final diagnoses:  COPD exacerbation ([place])  Community acquired pneumonia of right lower lobe of lung    Rx / DC Orders ED Discharge Orders    None         Charlesetta Shanks, MD 10/27/19 1620    Charlesetta Shanks, MD 10/27/19 1701

## 2019-10-27 NOTE — Telephone Encounter (Signed)
   Patients son calling to follow up on order for nebulizer.

## 2019-10-27 NOTE — H&P (Addendum)
History and Physical    Sherry Lambert [id number] DOB: [birth year] DOA: 10/27/2019  PCP: [name], MD Patient coming from: Home  Chief Complaint: Couldn't get her breath  HPI: Sherry Lambert is a 79 y.o. female with medical history significant of colon cancer, DM, GERD, hyperlipidemia, CVA with right sided paresthesias. Patient presented secondary to progressively worsening dyspnea especially on exertion. She states that symptoms started about two weeks ago after having a fall in hitting her right chest on the kitchen floor. She has used her albuterol which helps for a short period but symptoms of dyspnea return. She lives alone but her son lives beside her. She think she will likely need a walker before leaving the hospital.  ED Course: Vitals: Afebrile, pulse of 94, RR of 21, BP of 137/53, SpO2 of 98% on 2L via [place] Labs: BNP of 451, troponin of 26 >20, WBC of 12,000, POC sars-cov-2 neg Imaging: CTA chest significant for bilateral pleural effusions (small) and infiltrate of left lung base Medications/Course: Ceftriaxone, azithromycin, Duoneb  Review of Systems: Review of Systems  Constitutional: Negative for chills and fever.  Respiratory: Positive for cough, sputum production and shortness of breath.   Cardiovascular: Negative for chest pain.  Gastrointestinal: Negative for nausea and vomiting.  All other systems reviewed and are negative.   Past Medical History:  Diagnosis Date  . 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 [name]   . Cataract   . Chronic kidney disease    kidney stone  . Colitis   . Colon cancer ([place]) 06/2017   cecum  . Diabetes mellitus without complication ([place])    no meds taken  . Emphysema of lung ([place])   . GERD (gastroesophageal reflux disease)   . Hyperlipidemia   . Hypertension   . Peripheral arterial disease ([place])   . [name]   . Stroke [facility])    November 2016- Thanksgiving night    Past Surgical History:    Procedure Laterality Date  . ABDOMINAL HYSTERECTOMY    . BALLOON DILATION N/A 12/23/2012   Procedure: BALLOON DILATION;  Surgeon: Inda Castle, MD;  Location: [facility];  Service: Endoscopy;  Laterality: N/A;  . BALLOON DILATION N/A 01/15/2013   Procedure: BALLOON DILATION;  Surgeon: Inda Castle, MD;  Location: [facility];  Service: Endoscopy;  Laterality: N/A;  . BALLOON DILATION N/A 07/22/2018   Procedure: BALLOON DILATION;  Surgeon: Mauri Pole, MD;  Location: [facility];  Service: Endoscopy;  Laterality: N/A;  . cataract     both eyes  . COLONOSCOPY    . COLONOSCOPY WITH PROPOFOL N/A 03/05/2017   Procedure: COLONOSCOPY WITH PROPOFOL;  Surgeon: Mauri Pole, MD;  Location: [facility];  Service: Endoscopy;  Laterality: N/A;  . DENTAL RESTORATION/EXTRACTION WITH X-RAY    . ESOPHAGOGASTRODUODENOSCOPY N/A 12/23/2012   Procedure: ESOPHAGOGASTRODUODENOSCOPY (EGD);  Surgeon: Inda Castle, MD;  Location: [facility];  Service: Endoscopy;  Laterality: N/A;  . ESOPHAGOGASTRODUODENOSCOPY N/A 01/15/2013   Procedure: ESOPHAGOGASTRODUODENOSCOPY (EGD);  Surgeon: Inda Castle, MD;  Location: [facility];  Service: Endoscopy;  Laterality: N/A;  . ESOPHAGOGASTRODUODENOSCOPY (EGD) WITH PROPOFOL N/A 07/22/2018   Procedure: ESOPHAGOGASTRODUODENOSCOPY (EGD) WITH PROPOFOL;  Surgeon: Mauri Pole, MD;  Location: [facility];  Service: Endoscopy;  Laterality: N/A;  . TONSILLECTOMY       reports that she quit smoking about 17 years ago. Her smoking use included cigarettes. She has a 55.00 pack-year smoking history. She has never used  smokeless tobacco. She reports that she does not drink alcohol or use drugs.  Allergies  Allergen Reactions  . Lipitor [Atorvastatin] Itching    Family History  Problem Relation Age of Onset  . Hypertension Father   . Diabetes Father   . Cancer Father        melanoma  . Colon cancer Sister 36  . Cancer Sister        colon,  surgery alone   . Early death Neg Hx   . Stroke Neg Hx   . Esophageal cancer Neg Hx   . Rectal cancer Neg Hx   . Stomach cancer Neg Hx    Prior to Admission medications   Medication Sig Start Date End Date Taking? Authorizing Provider  acetaminophen (TYLENOL) 500 MG tablet Take 500 mg by mouth daily.   Yes [provider]  albuterol (VENTOLIN HFA) 108 (90 Base) MCG/ACT inhaler Inhale 1-2 puffs into the lungs every 4 (four) hours as needed for wheezing or shortness of breath. 10/23/19  Yes [name], MD  aspirin EC 81 MG tablet Take 1 tablet (81 mg total) by mouth daily. 12/20/14  Yes Rosalin Hawking, MD  Carboxymethylcellul-Glycerin (LUBRICATING EYE DROPS OP) Place 1 drop into both eyes daily as needed (dry eyes).   Yes [provider]  Cholecalciferol (VITAMIN D3) 125 MCG (5000 UT) CAPS Take 5,000 Units by mouth daily.   Yes [provider]  diphenhydrAMINE (BENADRYL) 25 MG tablet Take 25 mg by mouth daily as needed for allergies.   Yes [provider]  Doxylamine Succinate, Sleep, (SLEEP AID PO) Take 1 tablet by mouth at bedtime as needed (sleep).   Yes [provider]  gabapentin (NEURONTIN) 300 MG capsule TAKE 1 CAPSULE BY MOUTH THREE TIMES DAILY Patient taking differently: Take 300 mg by mouth 3 (three) times daily.  05/28/19  Yes [name], MD  HYDROcodone-acetaminophen (NORCO/VICODIN) 5-325 MG tablet Take 1 tablet by mouth every 8 (eight) hours as needed. 10/23/19  Yes [name], MD  pravastatin (PRAVACHOL) 20 MG tablet Take 1 tablet by mouth once daily 10/21/19  Yes [name], MD  promethazine-dextromethorphan (PROMETHAZINE-DM) 6.25-15 MG/5ML syrup Take 5 mLs by mouth 2 (two) times daily as needed for cough. 08/05/19  Yes Burns, Claudina Lick, MD  traMADol (ULTRAM) 50 MG tablet TAKE 1 TABLET BY MOUTH ONCE DAILY AS NEEDED FOR MODERATE PAIN Patient taking differently: Take 50 mg by mouth daily as needed for  moderate pain.  09/14/19  Yes [name], MD  TRELEGY ELLIPTA 100-62.5-25 MCG/INH AEPB Inhale 1 puff by mouth once daily Patient taking differently: Inhale 1 puff into the lungs daily at 12 noon.  10/21/19  Yes [name], MD  esomeprazole (NEXIUM) 40 MG packet Take 40 mg by mouth 2 (two) times daily. Patient not taking: Reported on 10/27/2019 08/11/18   Mauri Pole, MD  lansoprazole (PREVACID SOLUTAB) 30 MG disintegrating tablet Take 1 tablet (30 mg total) by mouth 2 (two) times daily. 08/11/18   Mauri Pole, MD  Magnesium Oxide 400 MG CAPS Take 1 capsule (400 mg total) by mouth daily. Patient not taking: Reported on 10/27/2019 07/07/19   [name], MD    Physical Exam:  Physical Exam Vitals and nursing note reviewed.  Constitutional:      General: She is not in acute distress.    Appearance: She is well-developed. She is not diaphoretic.  HENT:     Mouth/Throat:  Mouth: Mucous membranes are moist.     Dentition: Has dentures.     Comments: thrush Eyes:     Conjunctiva/sclera: Conjunctivae normal.     Pupils: Pupils are equal, round, and reactive to light.  Cardiovascular:     Rate and Rhythm: Normal rate and regular rhythm.     Heart sounds: Normal heart sounds. No murmur.  Pulmonary:     Effort: Pulmonary effort is normal. No respiratory distress.     Breath sounds: Examination of the right-lower field reveals decreased breath sounds and rales. Examination of the left-lower field reveals decreased breath sounds and rales. Decreased breath sounds and rales (Greater on left) present. No wheezing or rhonchi.  Abdominal:     General: Bowel sounds are normal. There is no distension.     Palpations: Abdomen is soft.     Tenderness: There is no abdominal tenderness. There is no guarding or rebound.  Musculoskeletal:        General: No tenderness. Normal range of motion.     Cervical back: Normal range of motion.     Right lower leg: No  edema.     Left lower leg: No edema.  Lymphadenopathy:     Cervical: No cervical adenopathy.  Skin:    General: Skin is warm and dry.  Neurological:     Mental Status: She is alert and oriented to person, place, and time.      Labs on Admission: I have personally reviewed following labs and imaging studies  CBC: Recent Labs  Lab 10/27/19 1127  WBC 12.0*  NEUTROABS 11.1*  HGB 13.7  HCT 45.0  MCV 95.9  PLT 123456    Basic Metabolic Panel: Recent Labs  Lab 10/27/19 1127  NA 140  K 3.7  CL 105  CO2 25  GLUCOSE 157*  BUN 11  CREATININE 0.73  CALCIUM 9.1    GFR: Estimated Creatinine Clearance: 45.6 mL/min (by C-G formula based on SCr of 0.73 mg/dL).  Liver Function Tests: Recent Labs  Lab 10/27/19 1127  AST 17  ALT 14  ALKPHOS 74  BILITOT 0.5  PROT 7.2  ALBUMIN 3.5   Recent Labs  Lab 10/27/19 1127  LIPASE 22   No results for input(s): AMMONIA in the last 168 hours.  Coagulation Profile: Recent Labs  Lab 10/27/19 1127  INR 1.0    Cardiac Enzymes: No results for input(s): CKTOTAL, CKMB, CKMBINDEX, TROPONINI in the last 168 hours.  BNP (last 3 results) No results for input(s): PROBNP in the last 8760 hours.  HbA1C: No results for input(s): HGBA1C in the last 72 hours.  CBG: No results for input(s): GLUCAP in the last 168 hours.  Lipid Profile: No results for input(s): CHOL, HDL, LDLCALC, TRIG, CHOLHDL, LDLDIRECT in the last 72 hours.  Thyroid Function Tests: No results for input(s): TSH, T4TOTAL, FREET4, T3FREE, THYROIDAB in the last 72 hours.  Anemia Panel: No results for input(s): VITAMINB12, FOLATE, FERRITIN, TIBC, IRON, RETICCTPCT in the last 72 hours.  Urine analysis:    Component Value Date/Time   COLORURINE YELLOW 10/27/2019 1500   APPEARANCEUR HAZY (A) 10/27/2019 1500   LABSPEC 1.011 10/27/2019 1500   PHURINE 6.0 10/27/2019 1500   GLUCOSEU NEGATIVE 10/27/2019 1500   GLUCOSEU NEGATIVE 06/18/2019 0837   HGBUR SMALL (A)  10/27/2019 1500   BILIRUBINUR NEGATIVE 10/27/2019 1500   BILIRUBINUR Neg 08/26/2018 1053   KETONESUR NEGATIVE 10/27/2019 1500   PROTEINUR NEGATIVE 10/27/2019 1500   UROBILINOGEN 0.2 06/18/2019 0837   NITRITE  NEGATIVE 10/27/2019 1500   LEUKOCYTESUR TRACE (A) 10/27/2019 1500     Radiological Exams on Admission: CT Angio Chest PE W/Cm &/Or Wo Cm  Result Date: 10/27/2019 CLINICAL DATA:  [name] EXAM: CT ANGIOGRAPHY CHEST WITH CONTRAST TECHNIQUE: Multidetector CT imaging of the chest was performed using the standard protocol during bolus administration of intravenous contrast. Multiplanar CT image reconstructions and MIPs were obtained to evaluate the vascular anatomy. CONTRAST:  168mL OMNIPAQUE IOHEXOL 350 MG/ML SOLN COMPARISON:  None. FINDINGS: Cardiovascular: There is a optimal opacification of the pulmonary arteries. There is no central,segmental, or subsegmental filling defects within the pulmonary arteries. There is mild cardiomegaly. Trace pericardial effusion is seen. No evidence of right ventricular heart strain. Coronary artery calcifications are seen. There is normal three-vessel brachiocephalic anatomy without proximal stenosis. Scattered aortic atherosclerosis is noted. There is atherosclerosis at the origin of the great vessels. Mediastinum/Nodes: No hilar, mediastinal, or axillary adenopathy. Thyroid gland, trachea, and esophagus demonstrate no significant findings. Lungs/Pleura: Centrilobular emphysematous changes are seen predominantly within both upper lungs. There are small bilateral pleural effusions, left greater than right. There is patchy airspace opacity seen at the periphery of the left lung base. Upper Abdomen: A moderate hiatal hernia is present. Musculoskeletal: No chest wall abnormality. No acute or significant osseous findings. Chronic anterior wedge compression deformities of the T12 and L1 vertebral bodies are seen with approximately 25-30% loss of vertebral body  height. Review of the MIP images confirms the above findings. IMPRESSION: 1. No central, segmental, or subsegmental pulmonary embolism. 2. Small bilateral pleural effusions, left greater than right. 3. Patchy airspace opacity seen at the left lung base which could be due to compressive atelectasis or early infectious etiology. 4. Trace pericardial effusion 5. Mild cardiomegaly 6. Moderate hiatal hernia 7.  Aortic Atherosclerosis (ICD10-I70.0). 8.  Emphysema (ICD10-J43.9). Electronically Signed   By: Prudencio Pair M.D.   On: 10/27/2019 15:59   DG Chest Port 1 View  Result Date: 10/27/2019 CLINICAL DATA:  [name], cough EXAM: PORTABLE CHEST 1 VIEW COMPARISON:  10/17/2019 FINDINGS: Stable mild cardiomegaly. Calcific aortic knob. Interval development of left lower lobe airspace consolidation. Right lung is clear. Possible small left pleural effusion. No pneumothorax. IMPRESSION: Interval development of left lower lobe airspace consolidation, suspicious for pneumonia. Electronically Signed   By: Davina Poke D.O.   On: 10/27/2019 11:25    EKG: Independently reviewed. Sinus rhythm. V6 with abnormal rhythm.  Assessment/Plan Active Problems:   Community acquired pneumonia   Community acquired pneumonia Afebrile. No blood cultures obtained prior to antibiotics. Started empirically on ceftriaxone and azithromycin -Continue Ceftriaxone and azithromycin -Blood cultures -Strep pneumo and legionella ur ag -Sputum culture  COPD Appears stable. -Continue Trelegy -Duoneb prn  Oral thrush Likely secondary to steroid inhaler -Nystatin rinse  History of stroke Residual right sided weakness/paresthesias -Continue gabapentin and aspirin -PT eval  Hyperlipidemia -Continue Pravastatin  Diabetes mellitus, type 2 Appears to be diet controlled. -SSI  Colon/rectal cancer Stage IIB. Adenocarcinoma. Patient follows with Dr. Burr Medico. Current therapy is surveillance.   DVT prophylaxis:  Lovenox Code Status: DNR Family Communication: None at bedside Disposition Plan: Medical floor Consults called: None Admission status: Observation   Cordelia Poche, MD [facility] 10/27/2019, 4:34 PM

## 2019-10-28 DIAGNOSIS — J9601 Acute respiratory failure with hypoxia: Secondary | ICD-10-CM | POA: Diagnosis not present

## 2019-10-28 DIAGNOSIS — Z8249 Family history of ischemic heart disease and other diseases of the circulatory system: Secondary | ICD-10-CM | POA: Diagnosis not present

## 2019-10-28 DIAGNOSIS — Z9089 Acquired absence of other organs: Secondary | ICD-10-CM | POA: Diagnosis not present

## 2019-10-28 DIAGNOSIS — Z972 Presence of dental prosthetic device (complete) (partial): Secondary | ICD-10-CM | POA: Diagnosis not present

## 2019-10-28 DIAGNOSIS — Z66 Do not resuscitate: Secondary | ICD-10-CM | POA: Diagnosis not present

## 2019-10-28 DIAGNOSIS — R0602 Shortness of breath: Secondary | ICD-10-CM | POA: Diagnosis present

## 2019-10-28 DIAGNOSIS — C19 Malignant neoplasm of rectosigmoid junction: Secondary | ICD-10-CM | POA: Diagnosis not present

## 2019-10-28 DIAGNOSIS — J441 Chronic obstructive pulmonary disease with (acute) exacerbation: Secondary | ICD-10-CM

## 2019-10-28 DIAGNOSIS — K219 Gastro-esophageal reflux disease without esophagitis: Secondary | ICD-10-CM | POA: Diagnosis present

## 2019-10-28 DIAGNOSIS — E1151 Type 2 diabetes mellitus with diabetic peripheral angiopathy without gangrene: Secondary | ICD-10-CM | POA: Diagnosis not present

## 2019-10-28 DIAGNOSIS — Z87891 Personal history of nicotine dependence: Secondary | ICD-10-CM | POA: Diagnosis not present

## 2019-10-28 DIAGNOSIS — M8580 Other specified disorders of bone density and structure, unspecified site: Secondary | ICD-10-CM | POA: Diagnosis present

## 2019-10-28 DIAGNOSIS — Z20822 Contact with and (suspected) exposure to covid-19: Secondary | ICD-10-CM | POA: Diagnosis not present

## 2019-10-28 DIAGNOSIS — J449 Chronic obstructive pulmonary disease, unspecified: Secondary | ICD-10-CM | POA: Diagnosis not present

## 2019-10-28 DIAGNOSIS — E785 Hyperlipidemia, unspecified: Secondary | ICD-10-CM | POA: Diagnosis present

## 2019-10-28 DIAGNOSIS — Z9841 Cataract extraction status, right eye: Secondary | ICD-10-CM | POA: Diagnosis not present

## 2019-10-28 DIAGNOSIS — J439 Emphysema, unspecified: Secondary | ICD-10-CM | POA: Diagnosis not present

## 2019-10-28 DIAGNOSIS — I10 Essential (primary) hypertension: Secondary | ICD-10-CM | POA: Diagnosis present

## 2019-10-28 DIAGNOSIS — E1169 Type 2 diabetes mellitus with other specified complication: Secondary | ICD-10-CM | POA: Diagnosis not present

## 2019-10-28 DIAGNOSIS — I69351 Hemiplegia and hemiparesis following cerebral infarction affecting right dominant side: Secondary | ICD-10-CM | POA: Diagnosis not present

## 2019-10-28 DIAGNOSIS — J189 Pneumonia, unspecified organism: Secondary | ICD-10-CM | POA: Diagnosis not present

## 2019-10-28 DIAGNOSIS — B370 Candidal stomatitis: Secondary | ICD-10-CM | POA: Diagnosis not present

## 2019-10-28 DIAGNOSIS — Z9842 Cataract extraction status, left eye: Secondary | ICD-10-CM | POA: Diagnosis not present

## 2019-10-28 DIAGNOSIS — Z87442 Personal history of urinary calculi: Secondary | ICD-10-CM | POA: Diagnosis not present

## 2019-10-28 DIAGNOSIS — Z9181 History of falling: Secondary | ICD-10-CM | POA: Diagnosis not present

## 2019-10-28 DIAGNOSIS — Z8719 Personal history of other diseases of the digestive system: Secondary | ICD-10-CM | POA: Diagnosis not present

## 2019-10-28 DIAGNOSIS — Z90710 Acquired absence of both cervix and uterus: Secondary | ICD-10-CM | POA: Diagnosis not present

## 2019-10-28 DIAGNOSIS — I69398 Other sequelae of cerebral infarction: Secondary | ICD-10-CM | POA: Diagnosis not present

## 2019-10-28 DIAGNOSIS — R209 Unspecified disturbances of skin sensation: Secondary | ICD-10-CM | POA: Diagnosis not present

## 2019-10-28 DIAGNOSIS — R69 Illness, unspecified: Secondary | ICD-10-CM | POA: Diagnosis not present

## 2019-10-28 LAB — CBC WITH DIFFERENTIAL/PLATELET
Abs Immature Granulocytes: 0.03 10*3/uL (ref 0.00–0.07)
Basophils Absolute: 0 10*3/uL (ref 0.0–0.1)
Basophils Relative: 0 %
Eosinophils Absolute: 0 10*3/uL (ref 0.0–0.5)
Eosinophils Relative: 0 %
HCT: 39.4 % (ref 36.0–46.0)
Hemoglobin: 12.2 g/dL (ref 12.0–15.0)
Immature Granulocytes: 0 %
Lymphocytes Relative: 11 %
Lymphs Abs: 1 10*3/uL (ref 0.7–4.0)
MCH: 29.5 pg (ref 26.0–34.0)
MCHC: 31 g/dL (ref 30.0–36.0)
MCV: 95.2 fL (ref 80.0–100.0)
Monocytes Absolute: 0.5 10*3/uL (ref 0.1–1.0)
Monocytes Relative: 6 %
Neutro Abs: 7.4 10*3/uL (ref 1.7–7.7)
Neutrophils Relative %: 83 %
Platelets: 336 10*3/uL (ref 150–400)
RBC: 4.14 MIL/uL (ref 3.87–5.11)
RDW: 13 % (ref 11.5–15.5)
WBC: 8.9 10*3/uL (ref 4.0–10.5)
nRBC: 0 % (ref 0.0–0.2)

## 2019-10-28 LAB — HIV ANTIBODY (ROUTINE TESTING W REFLEX): HIV Screen 4th Generation wRfx: NONREACTIVE

## 2019-10-28 LAB — COMPREHENSIVE METABOLIC PANEL
ALT: 11 U/L (ref 0–44)
AST: 13 U/L — ABNORMAL LOW (ref 15–41)
Albumin: 3.2 g/dL — ABNORMAL LOW (ref 3.5–5.0)
Alkaline Phosphatase: 62 U/L (ref 38–126)
Anion gap: 10 (ref 5–15)
BUN: 16 mg/dL (ref 8–23)
CO2: 26 mmol/L (ref 22–32)
Calcium: 9.6 mg/dL (ref 8.9–10.3)
Chloride: 105 mmol/L (ref 98–111)
Creatinine, Ser: 0.79 mg/dL (ref 0.44–1.00)
GFR calc Af Amer: >60 mL/min (ref >60)
GFR calc non Af Amer: >60 mL/min (ref >60)
Glucose, Bld: 135 mg/dL — ABNORMAL HIGH (ref 70–99)
Potassium: 4 mmol/L (ref 3.5–5.1)
Sodium: 141 mmol/L (ref 135–145)
Total Bilirubin: 0.4 mg/dL (ref 0.3–1.2)
Total Protein: 6.6 g/dL (ref 6.5–8.1)

## 2019-10-28 LAB — GLUCOSE, CAPILLARY
Glucose-Capillary: 138 mg/dL — ABNORMAL HIGH (ref 70–99)
Glucose-Capillary: 91 mg/dL (ref 70–99)
Glucose-Capillary: 95 mg/dL (ref 70–99)
Glucose-Capillary: 145 mg/dL — ABNORMAL HIGH (ref 70–99)

## 2019-10-28 LAB — SARS CORONAVIRUS 2 (TAT 6-24 HRS): SARS Coronavirus 2: NEGATIVE

## 2019-10-28 NOTE — Evaluation (Signed)
Physical Therapy Evaluation Patient Details Name: Sherry Lambert MRN: [id number] DOB: [birth year] Today's Date: 10/28/2019   History of Present Illness  79 y.o. female with medical history significant of colon cancer, DM, GERD, hyperlipidemia, CVA with right sided paresthesias. Patient presented secondary to progressively worsening dyspnea especially on exertion and admitted for CAP.  Clinical Impression  Pt admitted with above diagnosis.  Pt currently with functional limitations due to the deficits listed below (see PT Problem List). Pt will benefit from skilled PT to increase their independence and safety with mobility to allow discharge to the venue listed below.  Pt assisted with ambulating however only short distance due to dyspnea.  Pt requiring at least 1L supplemental oxygen at this time.  Pt prefers d/c home however also agreeable to recommendation of more assist needed at home as pt fatigues quickly.  SATURATION QUALIFICATIONS: (This note is used to comply with regulatory documentation for home oxygen)  Patient Saturations on Room Air at Rest = 97%  Patient Saturations on Room Air while Ambulating = 84%  Patient Saturations on 1 Liters of oxygen while Ambulating = 90%  Please briefly explain why patient needs home oxygen: to improve oxygen saturations with physical activity such as ambulation.     Follow Up Recommendations Home health PT;Supervision for mobility/OOB    Equipment Recommendations  None recommended by PT    Recommendations for Other Services       Precautions / Restrictions Precautions Precautions: Fall Precaution Comments: monitor sats      Mobility  Bed Mobility Overal bed mobility: Needs Assistance Bed Mobility: Supine to Sit     Supine to sit: Min assist;HOB elevated     General bed mobility comments: assist for trunk upright  Transfers Overall transfer level: Needs assistance Equipment used: Rolling walker (2 wheeled) Transfers: Sit  to/from Stand Sit to Stand: Min guard         General transfer comment: min/guard for safety  Ambulation/Gait Ambulation/Gait assistance: Min guard Gait Distance (Feet): 14 Feet Assistive device: Rolling walker (2 wheeled) Gait Pattern/deviations: Step-through pattern;Decreased stride length     General Gait Details: Spo2 dropped to 84% on room air after 7 feet and pt reports 3/4 dyspnea so returned to recliner and reapplied O2, SPO2 90% on 1L O2 and improved to 99% upon end of session  Stairs            Wheelchair Mobility    Modified Rankin (Stroke Patients Only)       Balance Overall balance assessment: Needs assistance         Standing balance support: Bilateral upper extremity supported Standing balance-Leahy Scale: Poor                               Pertinent Vitals/Pain Pain Assessment: No/denies pain    Home Living Family/patient expects to be discharged to:: Private residence Living Arrangements: Alone Available Help at Discharge: Family;Available PRN/intermittently Type of Home: House Home Access: Stairs to enter Entrance Stairs-Rails: Right Entrance Stairs-Number of Steps: 3 Home Layout: One level Home Equipment: Walker - 2 wheels;Walker - 4 wheels;Cane - single point      Prior Function Level of Independence: Independent with assistive device(s)         Comments: pt uses rollator or SPC     Hand Dominance        Extremity/Trunk Assessment   Upper Extremity Assessment Upper Extremity Assessment: RUE deficits/detail RUE Deficits /  Details: reports R sided deficits since CVA including poor grip strength    Lower Extremity Assessment Lower Extremity Assessment: Generalized weakness;RLE deficits/detail RLE Deficits / Details: reports R sided deficits since CVA       Communication   Communication: No difficulties  Cognition Arousal/Alertness: Awake/alert Behavior During Therapy: WFL for tasks  assessed/performed Overall Cognitive Status: Within Functional Limits for tasks assessed                                        General Comments      Exercises     Assessment/Plan    PT Assessment Patient needs continued PT services  PT Problem List Decreased balance;Decreased activity tolerance;Decreased strength;Decreased mobility;Cardiopulmonary status limiting activity;Decreased knowledge of use of DME       PT Treatment Interventions DME instruction;Therapeutic exercise;Gait training;Balance training;Functional mobility training;Therapeutic activities;Patient/family education;Stair training    PT Goals (Current goals can be found in the Care Plan section)  Acute Rehab PT Goals PT Goal Formulation: With patient Time For Goal Achievement: 11/11/19 Potential to Achieve Goals: Good    Frequency Min 3X/week   Barriers to discharge        Co-evaluation               AM-PAC PT "6 Clicks" Mobility  Outcome Measure Help needed turning from your back to your side while in a flat bed without using bedrails?: A Little Help needed moving from lying on your back to sitting on the side of a flat bed without using bedrails?: A Little Help needed moving to and from a bed to a chair (including a wheelchair)?: A Little Help needed standing up from a chair using your arms (Lambert.g., wheelchair or bedside chair)?: A Little Help needed to walk in hospital room?: A Little Help needed climbing 3-5 steps with a railing? : A Lot 6 Click Score: 17    End of Session Equipment Utilized During Treatment: Gait belt;Oxygen Activity Tolerance: Patient tolerated treatment well Patient left: in chair;with call bell/phone within reach;with chair alarm set   PT Visit Diagnosis: Other abnormalities of gait and mobility (R26.89)    Time: [id number] PT Time Calculation (min) (ACUTE ONLY): 19 min   Charges:   PT Evaluation $PT Eval Low Complexity: 1 Low     Kati PT,  DPT Acute Rehabilitation Services Office: [phone]   Sherry Lambert,Sherry Lambert 10/28/2019, 11:30 AM

## 2019-10-28 NOTE — Telephone Encounter (Signed)
Anderson Malta, [employer], stated in the community message they called the pt and left a VM but didn't receive a return phone call. I provided the pt's son, Orpah Greek, phone number for them to contact.

## 2019-10-28 NOTE — Progress Notes (Signed)
PROGRESS NOTE    Sherry Lambert  [id number] DOB: 15-Dec-1940 DOA: 10/27/2019 PCP: [name], MD   Brief Narrative: 79 y.o. female with medical history significant of colon cancer, DM, GERD, hyperlipidemia, CVA with right sided paresthesias. Patient presented secondary to progressively worsening dyspnea especially on exertion. She states that symptoms started about two weeks ago after having a fall in hitting her right chest on the kitchen floor. She has used her albuterol which helps for a short period but symptoms of dyspnea return. She lives alone but her son lives beside her. She think she will likely need a walker before leaving the hospital.  ED Course: Vitals: Afebrile, pulse of 94, RR of 21, BP of 137/53, SpO2 of 98% on 2L via [place] Labs: BNP of 451, troponin of 26 >20, WBC of 12,000, POC sars-cov-2 neg Imaging: CTA chest significant for bilateral pleural effusions (small) and infiltrate of left lung base Medications/Course: Ceftriaxone, azithromycin, Duoneb  Assessment & Plan:   Active Problems:   Diabetes mellitus type 2, controlled, 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[place])   Hyperlipidemia associated with type 2 diabetes mellitus (HCC)   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)   Essential hypertension   COPD (chronic obstructive pulmonary disease) ([place])   Community acquired pneumonia   #1 acute hypoxic respiratory failure secondary to community-acquired pneumonia-patient admitted with shortness of breath and cough.  Started on Rocephin and azithromycin continue. She became hypoxic to 84% this morning on ambulation. Follow-up blood culture urine culture and sputum culture Seen by PT recommending home health PT on discharge.  #2 history of COPD on Trelegy at home she is not wheezing continue as needed DuoNeb  #3 history of stroke with residual right-sided weakness on aspirin and gabapentin  #4 history of hyperlipidemia on statin  #5 history of type  2 diabetes on SSI CBG (last 3)  Recent Labs    10/27/19 2027 10/28/19 0717 10/28/19 1147  GLUCAP 301* 138* 91     #6 history of colorectal cancer followed by Dr. Annamaria Boots as an outpatient    Estimated body mass index is 24.94 kg/m as calculated from the following:   Height as of this encounter: 4\' 11"  (1.499 m).   Weight as of this encounter: 56 kg.  DVT prophylaxis: Lovenox Code Status: DNR  family Communication: None Disposition Plan: Patient came from home plan is to discharge her home once her hypoxia is improved She Is Admitted with community-acquired pneumonia   Consultants:   None  Procedures: None Antimicrobials: Rocephin and azithromycin Subjective: Resting in bed still complaining of shortness of breath and cough with dyspnea on exertion  Objective: Vitals:   10/27/19 2037 10/28/19 0405 10/28/19 0732 10/28/19 1344  BP: 122/62 (!) 106/52  (!) 132/56  Pulse: 81 73  87  Resp: 20 17  20   Temp: 98.7 F (37.1 C) 98.1 F (36.7 C)  98.3 F (36.8 C)  TempSrc: Oral Oral  Oral  SpO2: 99% 98% 97% 96%  Weight:      Height:        Intake/Output Summary (Last 24 hours) at 10/28/2019 1443 Last data filed at 10/28/2019 1300 Gross per 24 hour  Intake 260 ml  Output 0 ml  Net 260 ml   Filed Weights   10/27/19 1015 10/27/19 1844  Weight: 57.6 kg 56 kg    Examination:  General exam: Appears calm and comfortable  Respiratory system: Scattered rhonchi bilaterally to auscultation. Respiratory effort normal. Cardiovascular system: S1 & S2 heard, RRR.  No JVD, murmurs, rubs, gallops or clicks. No pedal edema. Gastrointestinal system: Abdomen is nondistended, soft and nontender. No organomegaly or masses felt. Normal bowel sounds heard. Central nervous system: Alert and oriented. No focal neurological deficits. Extremities: Symmetric 5 x 5 power. Skin: No rashes, lesions or ulcers Psychiatry: Judgement and insight appear normal. Mood & affect appropriate.     Data  Reviewed: I have personally reviewed following labs and imaging studies  CBC: Recent Labs  Lab 10/27/19 1127 10/28/19 0457  WBC 12.0* 8.9  NEUTROABS 11.1* 7.4  HGB 13.7 12.2  HCT 45.0 39.4  MCV 95.9 95.2  PLT 315 [id number]   Basic Metabolic Panel: Recent Labs  Lab 10/27/19 1127 10/28/19 0457  NA 140 141  K 3.7 4.0  CL 105 105  CO2 25 26  GLUCOSE 157* 135*  BUN 11 16  CREATININE 0.73 0.79  CALCIUM 9.1 9.6   GFR: Estimated Creatinine Clearance: 44.9 mL/min (by C-G formula based on SCr of 0.79 mg/dL). Liver Function Tests: Recent Labs  Lab 10/27/19 1127 10/28/19 0457  AST 17 13*  ALT 14 11  ALKPHOS 74 62  BILITOT 0.5 0.4  PROT 7.2 6.6  ALBUMIN 3.5 3.2*   Recent Labs  Lab 10/27/19 1127  LIPASE 22   No results for input(s): AMMONIA in the last 168 hours. Coagulation Profile: Recent Labs  Lab 10/27/19 1127  INR 1.0   Cardiac Enzymes: No results for input(s): CKTOTAL, CKMB, CKMBINDEX, TROPONINI in the last 168 hours. BNP (last 3 results) No results for input(s): PROBNP in the last 8760 hours. HbA1C: Recent Labs    10/27/19 1923  HGBA1C 6.5*   CBG: Recent Labs  Lab 10/27/19 2027 10/28/19 0717 10/28/19 1147  GLUCAP 301* 138* 91   Lipid Profile: No results for input(s): CHOL, HDL, LDLCALC, TRIG, CHOLHDL, LDLDIRECT in the last 72 hours. Thyroid Function Tests: No results for input(s): TSH, T4TOTAL, FREET4, T3FREE, THYROIDAB in the last 72 hours. Anemia Panel: No results for input(s): VITAMINB12, FOLATE, FERRITIN, TIBC, IRON, RETICCTPCT in the last 72 hours. Sepsis Labs: Recent Labs  Lab 10/27/19 1127 10/27/19 1403  LATICACIDVEN 1.0 0.9    Recent Results (from the past 240 hour(s))  SARS CORONAVIRUS 2 (TAT 6-24 HRS) Nasopharyngeal Nasopharyngeal Swab     Status: None   Collection Time: 10/27/19  5:53 PM   Specimen: Nasopharyngeal Swab  Result Value Ref Range Status   SARS Coronavirus 2 NEGATIVE NEGATIVE Final    Comment: (NOTE) SARS-CoV-2  target nucleic acids are NOT DETECTED. The SARS-CoV-2 RNA is generally detectable in upper and lower respiratory specimens during the acute phase of infection. Negative results do not preclude SARS-CoV-2 infection, do not rule out co-infections with other pathogens, and should not be used as the sole basis for treatment or other patient management decisions. Negative results must be combined with clinical observations, patient history, and epidemiological information. The expected result is Negative. Fact Sheet for Patients: SugarRoll.be Fact Sheet for Healthcare Providers: https://www.[employer].com/ This test is not yet approved or cleared by the [place] FDA and  has been authorized for detection and/or diagnosis of SARS-CoV-2 by FDA under an Emergency Use Authorization (EUA). This EUA will remain  in effect (meaning this test can be used) for the duration of the COVID-19 declaration under Section 56 4(b)(1) of the Act, 21 U.S.C. section 360bbb-3(b)(1), unless the authorization is terminated or revoked sooner. Performed at [facility], [place] [address], [place], [place] [address]  Radiology Studies: CT Angio Chest PE W/Cm &/Or Wo Cm  Result Date: 10/27/2019 CLINICAL DATA:  [name] EXAM: CT ANGIOGRAPHY CHEST WITH CONTRAST TECHNIQUE: Multidetector CT imaging of the chest was performed using the standard protocol during bolus administration of intravenous contrast. Multiplanar CT image reconstructions and MIPs were obtained to evaluate the vascular anatomy. CONTRAST:  121mL OMNIPAQUE IOHEXOL 350 MG/ML SOLN COMPARISON:  None. FINDINGS: Cardiovascular: There is a optimal opacification of the pulmonary arteries. There is no central,segmental, or subsegmental filling defects within the pulmonary arteries. There is mild cardiomegaly. Trace pericardial effusion is seen. No evidence of right ventricular heart strain.  Coronary artery calcifications are seen. There is normal three-vessel brachiocephalic anatomy without proximal stenosis. Scattered aortic atherosclerosis is noted. There is atherosclerosis at the origin of the great vessels. Mediastinum/Nodes: No hilar, mediastinal, or axillary adenopathy. Thyroid gland, trachea, and esophagus demonstrate no significant findings. Lungs/Pleura: Centrilobular emphysematous changes are seen predominantly within both upper lungs. There are small bilateral pleural effusions, left greater than right. There is patchy airspace opacity seen at the periphery of the left lung base. Upper Abdomen: A moderate hiatal hernia is present. Musculoskeletal: No chest wall abnormality. No acute or significant osseous findings. Chronic anterior wedge compression deformities of the T12 and L1 vertebral bodies are seen with approximately 25-30% loss of vertebral body height. Review of the MIP images confirms the above findings. IMPRESSION: 1. No central, segmental, or subsegmental pulmonary embolism. 2. Small bilateral pleural effusions, left greater than right. 3. Patchy airspace opacity seen at the left lung base which could be due to compressive atelectasis or early infectious etiology. 4. Trace pericardial effusion 5. Mild cardiomegaly 6. Moderate hiatal hernia 7.  Aortic Atherosclerosis (ICD10-I70.0). 8.  Emphysema (ICD10-J43.9). Electronically Signed   By: Prudencio Pair M.D.   On: 10/27/2019 15:59   DG Chest Port 1 View  Result Date: 10/27/2019 CLINICAL DATA:  [name], cough EXAM: PORTABLE CHEST 1 VIEW COMPARISON:  10/17/2019 FINDINGS: Stable mild cardiomegaly. Calcific aortic knob. Interval development of left lower lobe airspace consolidation. Right lung is clear. Possible small left pleural effusion. No pneumothorax. IMPRESSION: Interval development of left lower lobe airspace consolidation, suspicious for pneumonia. Electronically Signed   By: Davina Poke D.O.   On: 10/27/2019  11:25        Scheduled Meds: . aspirin EC  81 mg Oral Daily  . azithromycin  500 mg Oral Daily  . cholecalciferol  5,000 Units Oral Daily  . enoxaparin (LOVENOX) injection  40 mg Subcutaneous Q24H  . fluticasone furoate-vilanterol  1 puff Inhalation Daily   And  . umeclidinium bromide  1 puff Inhalation Daily  . gabapentin  300 mg Oral TID  . insulin aspart  0-15 Units Subcutaneous TID WC  . nystatin  5 mL Oral QID  . pravastatin  20 mg Oral Daily   Continuous Infusions: . cefTRIAXone (ROCEPHIN)  IV       LOS: 0 days     Georgette Shell, MD 10/28/2019, 2:43 PM

## 2019-10-29 DIAGNOSIS — I69398 Other sequelae of cerebral infarction: Secondary | ICD-10-CM

## 2019-10-29 DIAGNOSIS — R209 Unspecified disturbances of skin sensation: Secondary | ICD-10-CM

## 2019-10-29 LAB — STREP PNEUMONIAE URINARY ANTIGEN: Strep Pneumo Urinary Antigen: NEGATIVE

## 2019-10-29 LAB — GLUCOSE, CAPILLARY
Glucose-Capillary: 115 mg/dL — ABNORMAL HIGH (ref 70–99)
Glucose-Capillary: 84 mg/dL (ref 70–99)

## 2019-10-29 MED ORDER — AMOXICILLIN-POT CLAVULANATE 875-125 MG PO TABS: 1.0000 | ORAL_TABLET | Freq: Two times a day (BID) | ORAL | 0 refills | Status: AC

## 2019-10-29 MED ORDER — IPRATROPIUM-ALBUTEROL 0.5-2.5 (3) MG/3ML IN SOLN: 3.0000 mL | RESPIRATORY_TRACT | 6 refills | Status: DC | PRN

## 2019-10-29 NOTE — Progress Notes (Signed)
   Patient Details Name: Sherry Lambert MRN: [id number] DOB: [birth year]   SATURATION QUALIFICATIONS: (This note is used to comply with regulatory documentation for home oxygen)  Patient Saturations on Room Air at Rest = 94%  Patient Saturations on Room Air while Ambulating = 86%  Patient Saturations on 1 Liters of oxygen while Ambulating = 97%  Please briefly explain why patient needs home oxygen: to improve oxygen saturations with physical activity such as ambulating.  Hawraa Stambaugh,KATHrine E 10/29/2019, 11:25 AM Arlyce Dice, DPT Acute Rehabilitation Services Office: [phone]

## 2019-10-29 NOTE — TOC Transition Note (Signed)
Transition of Care [facility]) - CM/SW Discharge Note   Patient Details  Name: Sherry Lambert MRN: [id number] Date of Birth: 11/17/1940  Transition of Care [facility] CM/SW Contact:  Sherry Phi, RN Phone Number: 10/29/2019, 10:45 AM   Clinical Narrative: d/c home today w/[facility]-soc of care 3/2-rep Sherry Lambert. Spoke to son Sherry Lambert agree w/SOC/MD also. [facility] home 02-will deliver travel tank to rm prior d/c. Son able to transport home. No further CM needs.      Final next level of care: Sherry Lambert Barriers to Discharge: No Barriers Identified   Patient Goals and CMS Choice Patient states their goals for this hospitalization and ongoing recovery are:: go  home CMS Medicare.gov Compare Post Acute Care list provided to:: Patient Represenative (must comment) Choice offered to / list presented to : Adult Children  Discharge Placement                       Discharge Plan and Services   Discharge Planning Services: CM Consult Post Acute Care Choice: Home Health, Durable Medical Equipment          DME Arranged: Oxygen DME Agency: [facility] Date DME Agency Contacted: 10/29/19 Time DME Agency Contacted: [id number] Representative spoke with at DME Agency: Sherry Lambert: Sherry Lambert: Sherry Lambert Date [place]: 10/29/19 Time [place]: [id number] Representative spoke with at [place]: [place] Determinants of Health ([place]) Interventions     Readmission Risk Interventions No flowsheet data found.

## 2019-10-29 NOTE — Progress Notes (Signed)
Physical Therapy Treatment Patient Details Name: ROBYNE FRASCA MRN: [id number] DOB: 11-03-1940 Today's Date: 10/29/2019    History of Present Illness 79 y.o. female with medical history significant of colon cancer, DM, GERD, hyperlipidemia, CVA with right sided paresthesias. Patient presented secondary to progressively worsening dyspnea especially on exertion and admitted for CAP.    PT Comments    Pt assisted to bathroom and very SOB, SPO2 dropped to 86% on room air so 1L O2 applied.  Pt ambulated short distance in hallway on 1L O2 [place].  Pt states she will likely d/c home today.  Pt will likely require supplemental oxygen (saturation qualification in progress notes).   Follow Up Recommendations  Home health PT;Supervision for mobility/OOB     Equipment Recommendations  None recommended by PT    Recommendations for Other Services       Precautions / Restrictions Precautions Precautions: Fall Precaution Comments: monitor sats Restrictions Weight Bearing Restrictions: No    Mobility  Bed Mobility Overal bed mobility: Needs Assistance Bed Mobility: Supine to Sit     Supine to sit: Min assist;HOB elevated     General bed mobility comments: assist for trunk upright  Transfers Overall transfer level: Needs assistance Equipment used: Rolling walker (2 wheeled) Transfers: Sit to/from Stand Sit to Stand: Min guard         General transfer comment: min/guard for safety (min from toilet surface)  Ambulation/Gait Ambulation/Gait assistance: Min guard Gait Distance (Feet): 70 Feet Assistive device: Rolling walker (2 wheeled) Gait Pattern/deviations: Step-through pattern;Decreased stride length     General Gait Details: Spo2 dropped to 86% on room air upon ambulating to bathroom so 1L O2 reapplied.  SPO2 97% on 1L during ambulation; pt reports 3/4 dyspnea and cued for pursed lip breathing, distance to tolerance   Stairs             Wheelchair Mobility     Modified Rankin (Stroke Patients Only)       Balance                                            Cognition Arousal/Alertness: Awake/alert Behavior During Therapy: WFL for tasks assessed/performed Overall Cognitive Status: Within Functional Limits for tasks assessed                                        Exercises      General Comments        Pertinent Vitals/Pain Pain Assessment: No/denies pain    Home Living                      Prior Function            PT Goals (current goals can now be found in the care plan section) Progress towards PT goals: Progressing toward goals    Frequency    Min 3X/week      PT Plan Current plan remains appropriate    Co-evaluation              AM-PAC PT "6 Clicks" Mobility   Outcome Measure  Help needed turning from your back to your side while in a flat bed without using bedrails?: A Little Help needed moving from lying on your back to sitting on the side  of a flat bed without using bedrails?: A Little Help needed moving to and from a bed to a chair (including a wheelchair)?: A Little Help needed standing up from a chair using your arms (e.g., wheelchair or bedside chair)?: A Little Help needed to walk in hospital room?: A Little Help needed climbing 3-5 steps with a railing? : A Lot 6 Click Score: 17    End of Session Equipment Utilized During Treatment: Oxygen Activity Tolerance: Patient limited by fatigue Patient left: in chair;with call bell/phone within reach;with chair alarm set Nurse Communication: Mobility status PT Visit Diagnosis: Other abnormalities of gait and mobility (R26.89)     Time: [id number] PT Time Calculation (min) (ACUTE ONLY): 18 min  Charges:  $Gait Training: 8-22 mins                    Arlyce Dice, DPT Acute Rehabilitation Services Office: [place] E 10/29/2019, 1:35 PM

## 2019-10-29 NOTE — Discharge Summary (Signed)
Physician Discharge Summary  Sherry Lambert [id number] DOB: Jan 17, 1941 DOA: 10/27/2019  PCP: [name], MD  Admit date: 10/27/2019 Discharge date: 10/29/2019  Admitted From: home Disposition:  home Recommendations for Outpatient Follow-up:  1. Follow up with PCP in 1-2 weeks 2. Please obtain BMP/CBC in one week   Home Health:pt Equipment/Devices none Discharge Condition:stable and improved  CODE STATUS:dnr Diet recommendation: cardiac Brief/Interim Summary: 79 y.o.femalewith medical history significant ofcolon cancer, DM, GERD, hyperlipidemia, CVA with right sided paresthesias.Patient presented secondary to progressively worsening dyspnea especially on exertion. She states that symptoms started about two weeks ago after having a fall in hitting her right chest on the kitchen floor.She has used her albuterol which helps for a short period but symptoms of dyspnea return. She lives alone but her son lives beside her. She think she will likely need a walker before leaving the hospital.  ED Course: Vitals:Afebrile, pulse of 94, RR of 21, BP of 137/53, SpO2 of 98% on 2L via [place] Labs:BNP of 451, troponin of 26 >20, WBC of 12,000, POC sars-cov-2 neg Imaging:CTA chest significant for bilateral pleural effusions (small) and infiltrate of left lung base Medications/Course:Ceftriaxone, azithromycin, Duoneb   Discharge Diagnoses:  Active Problems:   Diabetes mellitus type 2, controlled, 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[place])   Hyperlipidemia associated with type 2 diabetes mellitus (HCC)   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)   Essential hypertension   COPD exacerbation ([place])   Community acquired pneumonia  #1 acute hypoxic respiratory failure secondary to community-acquired pneumonia-patient admitted with shortness of breath and cough. Was treated with Rocephin and azithromycin.will dc on augmentin for 4 days Seen by PT recommending home health PT on  discharge.  #2 history of COPD on Trelegy at home she is not wheezing continue as needed DuoNeb  #3 history of stroke with residual right-sided weakness on aspirin and gabapentin  #4 history of hyperlipidemia on statin  #5 history of type 2 diabetes on SSI  #6 history of colorectal cancer followed by Dr. Annamaria Boots as an outpatient  Estimated body mass index is 24.94 kg/m as calculated from the following:   Height as of this encounter: 4\' 11"  (1.499 m).   Weight as of this encounter: 56 kg.  Discharge Instructions  Discharge Instructions    Call MD for:  difficulty breathing, headache or visual disturbances   Complete by: As directed    Call MD for:  persistant nausea and vomiting   Complete by: As directed    Call MD for:  temperature >100.4   Complete by: As directed    Diet - low sodium heart healthy   Complete by: As directed    Diet - low sodium heart healthy   Complete by: As directed    Increase activity slowly   Complete by: As directed    Increase activity slowly   Complete by: As directed      Allergies as of 10/29/2019      Reactions   Lipitor [atorvastatin] Itching      Medication List    STOP taking these medications   diphenhydrAMINE 25 MG tablet Commonly known as: BENADRYL   esomeprazole 40 MG packet Commonly known as: NexIUM   Magnesium Oxide 400 MG Caps     TAKE these medications   acetaminophen 500 MG tablet Commonly known as: TYLENOL Take 500 mg by mouth daily.   albuterol 108 (90 Base) MCG/ACT inhaler Commonly known as: VENTOLIN HFA Inhale 1-2 puffs into the lungs every 4 (four)  hours as needed for wheezing or shortness of breath.   amoxicillin-clavulanate 875-125 MG tablet Commonly known as: Augmentin Take 1 tablet by mouth 2 (two) times daily for 4 days.   aspirin EC 81 MG tablet Take 1 tablet (81 mg total) by mouth daily.   gabapentin 300 MG capsule Commonly known as: NEURONTIN TAKE 1 CAPSULE BY MOUTH THREE TIMES DAILY    HYDROcodone-acetaminophen 5-325 MG tablet Commonly known as: NORCO/VICODIN Take 1 tablet by mouth every 8 (eight) hours as needed.   ipratropium-albuterol 0.5-2.5 (3) MG/3ML Soln Commonly known as: DUONEB Take 3 mLs by nebulization every 4 (four) hours as needed.   lansoprazole 30 MG disintegrating tablet Commonly known as: Prevacid SoluTab Take 1 tablet (30 mg total) by mouth 2 (two) times daily.   LUBRICATING EYE DROPS OP Place 1 drop into both eyes daily as needed (dry eyes).   pravastatin 20 MG tablet Commonly known as: PRAVACHOL Take 1 tablet by mouth once daily   promethazine-dextromethorphan 6.25-15 MG/5ML syrup Commonly known as: PROMETHAZINE-DM Take 5 mLs by mouth 2 (two) times daily as needed for cough.   SLEEP AID PO Take 1 tablet by mouth at bedtime as needed (sleep).   traMADol 50 MG tablet Commonly known as: ULTRAM TAKE 1 TABLET BY MOUTH ONCE DAILY AS NEEDED FOR MODERATE PAIN What changed: See the new instructions.   Trelegy Ellipta 100-62.5-25 MCG/INH Aepb Generic drug: Fluticasone-Umeclidin-Vilant Inhale 1 puff by mouth once daily What changed: See the new instructions.   Vitamin D3 125 MCG (5000 UT) Caps Take 5,000 Units by mouth daily.      Follow-up Information    [name], MD Follow up.   Specialty: Internal Medicine Contact information: [place] [address] [phone]          Allergies  Allergen Reactions  . Lipitor [Atorvastatin] Itching    Consultations:  none   Procedures/Studies: DG Ribs Unilateral W/Chest Right  Result Date: 10/17/2019 CLINICAL DATA:  Status post fall with right rib pain. EXAM: RIGHT RIBS AND CHEST - 3+ VIEW COMPARISON:  None. FINDINGS: No fracture or dislocation are seen involving the ribs. There is no evidence of pneumothorax or pleural effusion. Both lungs are clear. Heart size and mediastinal contours are within normal limits. IMPRESSION: Negative. Electronically  Signed   By: Abelardo Diesel M.D.   On: 10/17/2019 13:54   DG Shoulder Right  Result Date: 10/17/2019 CLINICAL DATA:  Status post fall with right shoulder pain. EXAM: RIGHT SHOULDER - 2+ VIEW COMPARISON:  None. FINDINGS: There is no evidence of fracture or dislocation. There is no evidence of arthropathy or other focal bone abnormality. Soft tissues are unremarkable. IMPRESSION: Negative. Electronically Signed   By: Abelardo Diesel M.D.   On: 10/17/2019 13:52   CT Angio Chest PE W/Cm &/Or Wo Cm  Result Date: 10/27/2019 CLINICAL DATA:  [name] EXAM: CT ANGIOGRAPHY CHEST WITH CONTRAST TECHNIQUE: Multidetector CT imaging of the chest was performed using the standard protocol during bolus administration of intravenous contrast. Multiplanar CT image reconstructions and MIPs were obtained to evaluate the vascular anatomy. CONTRAST:  15mL OMNIPAQUE IOHEXOL 350 MG/ML SOLN COMPARISON:  None. FINDINGS: Cardiovascular: There is a optimal opacification of the pulmonary arteries. There is no central,segmental, or subsegmental filling defects within the pulmonary arteries. There is mild cardiomegaly. Trace pericardial effusion is seen. No evidence of right ventricular heart strain. Coronary artery calcifications are seen. There is normal three-vessel brachiocephalic anatomy without proximal stenosis. Scattered aortic  atherosclerosis is noted. There is atherosclerosis at the origin of the great vessels. Mediastinum/Nodes: No hilar, mediastinal, or axillary adenopathy. Thyroid gland, trachea, and esophagus demonstrate no significant findings. Lungs/Pleura: Centrilobular emphysematous changes are seen predominantly within both upper lungs. There are small bilateral pleural effusions, left greater than right. There is patchy airspace opacity seen at the periphery of the left lung base. Upper Abdomen: A moderate hiatal hernia is present. Musculoskeletal: No chest wall abnormality. No acute or significant osseous  findings. Chronic anterior wedge compression deformities of the T12 and L1 vertebral bodies are seen with approximately 25-30% loss of vertebral body height. Review of the MIP images confirms the above findings. IMPRESSION: 1. No central, segmental, or subsegmental pulmonary embolism. 2. Small bilateral pleural effusions, left greater than right. 3. Patchy airspace opacity seen at the left lung base which could be due to compressive atelectasis or early infectious etiology. 4. Trace pericardial effusion 5. Mild cardiomegaly 6. Moderate hiatal hernia 7.  Aortic Atherosclerosis (ICD10-I70.0). 8.  Emphysema (ICD10-J43.9). Electronically Signed   By: Prudencio Pair M.D.   On: 10/27/2019 15:59   DG Chest Port 1 View  Result Date: 10/27/2019 CLINICAL DATA:  [name], cough EXAM: PORTABLE CHEST 1 VIEW COMPARISON:  10/17/2019 FINDINGS: Stable mild cardiomegaly. Calcific aortic knob. Interval development of left lower lobe airspace consolidation. Right lung is clear. Possible small left pleural effusion. No pneumothorax. IMPRESSION: Interval development of left lower lobe airspace consolidation, suspicious for pneumonia. Electronically Signed   By: Davina Poke D.O.   On: 10/27/2019 11:25    (Echo, Carotid, EGD, Colonoscopy, ERCP)    Subjective:  Resting in bed no oxygen on no new complaints Discharge Exam: Vitals:   10/29/19 0523 10/29/19 0818  BP: 124/77   Pulse: 75   Resp: 17   Temp: 97.8 F (36.6 C)   SpO2: 96% 92%   Vitals:   10/28/19 1344 10/28/19 2021 10/29/19 0523 10/29/19 0818  BP: (!) 132/56 129/63 124/77   Pulse: 87 84 75   Resp: 20 17 17    Temp: 98.3 F (36.8 C) 98.2 F (36.8 C) 97.8 F (36.6 C)   TempSrc: Oral Oral Oral   SpO2: 96% 100% 96% 92%  Weight:      Height:        General: Pt is alert, awake, not in acute distress Cardiovascular: RRR, S1/S2 +, no rubs, no gallops Respiratory: few crackles bases bilaterally, no wheezing, no rhonchi Abdominal: Soft,  NT, ND, bowel sounds + Extremities: no edema, no cyanosis    The results of significant diagnostics from this hospitalization (including imaging, microbiology, ancillary and laboratory) are listed below for reference.     Microbiology: Recent Results (from the past 240 hour(s))  SARS CORONAVIRUS 2 (TAT 6-24 HRS) Nasopharyngeal Nasopharyngeal Swab     Status: None   Collection Time: 10/27/19  5:53 PM   Specimen: Nasopharyngeal Swab  Result Value Ref Range Status   SARS Coronavirus 2 NEGATIVE NEGATIVE Final    Comment: (NOTE) SARS-CoV-2 target nucleic acids are NOT DETECTED. The SARS-CoV-2 RNA is generally detectable in upper and lower respiratory specimens during the acute phase of infection. Negative results do not preclude SARS-CoV-2 infection, do not rule out co-infections with other pathogens, and should not be used as the sole basis for treatment or other patient management decisions. Negative results must be combined with clinical observations, patient history, and epidemiological information. The expected result is Negative. Fact Sheet for Patients: SugarRoll.be Fact Sheet for Healthcare Providers: https://www.woods-[place].com/  This test is not yet approved or cleared by the [place] and  has been authorized for detection and/or diagnosis of SARS-CoV-2 by FDA under an Emergency Use Authorization (EUA). This EUA will remain  in effect (meaning this test can be used) for the duration of the COVID-19 declaration under Section 56 4(b)(1) of the Act, 21 U.S.C. section 360bbb-3(b)(1), unless the authorization is terminated or revoked sooner. Performed at [facility], [place] [address], [place], [place] [address]   Culture, blood (routine x 2)     Status: None (Preliminary result)   Collection Time: 10/27/19  7:23 PM   Specimen: BLOOD  Result Value Ref Range Status   Specimen Description   Final    BLOOD SITE NOT  SPECIFIED Performed at [facility] [address], [place], [place] [address]    Special Requests   Final    BOTTLES DRAWN AEROBIC AND ANAEROBIC Blood Culture adequate volume Performed at [facility] [address], [place], [place] [address]    Culture   Final    NO GROWTH < 24 HOURS Performed at [facility] [address], [place], [place] [address]    Report Status PENDING  Incomplete  Culture, blood (routine x 2)     Status: None (Preliminary result)   Collection Time: 10/27/19  8:11 PM   Specimen: BLOOD  Result Value Ref Range Status   Specimen Description   Final    BLOOD LEFT WRIST Performed at [facility] [address] [place], [place] [address]    Special Requests   Final    BOTTLES DRAWN AEROBIC ONLY Blood Culture adequate volume Performed at [facility] [address] [place], [place] [address]    Culture   Final    NO GROWTH < 24 HOURS Performed at [facility] [address], [place], [place] [address]    Report Status PENDING  Incomplete     Labs: BNP (last 3 results) Recent Labs    10/27/19 1127  BNP [id number]*   Basic Metabolic Panel: Recent Labs  Lab 10/27/19 1127 10/28/19 0457  NA 140 141  K 3.7 4.0  CL 105 105  CO2 25 26  GLUCOSE 157* 135*  BUN 11 16  CREATININE 0.73 0.79  CALCIUM 9.1 9.6   Liver Function Tests: Recent Labs  Lab 10/27/19 1127 10/28/19 0457  AST 17 13*  ALT 14 11  ALKPHOS 74 62  BILITOT 0.5 0.4  PROT 7.2 6.6  ALBUMIN 3.5 3.2*   Recent Labs  Lab 10/27/19 1127  LIPASE 22   No results for input(s): AMMONIA in the last 168 hours. CBC: Recent Labs  Lab 10/27/19 1127 10/28/19 0457  WBC 12.0* 8.9  NEUTROABS 11.1* 7.4  HGB 13.7 12.2  HCT 45.0 39.4  MCV 95.9 95.2  PLT 315 336   Cardiac Enzymes: No results for input(s): CKTOTAL, CKMB, CKMBINDEX, TROPONINI in the last 168 hours. BNP: Invalid input(s):  POCBNP CBG: Recent Labs  Lab 10/28/19 0717 10/28/19 1147 10/28/19 1630 10/28/19 2019 10/29/19 0738  GLUCAP 138* 91 95 145* 115*   D-Dimer No results for input(s): DDIMER in the last 72 hours. Hgb A1c Recent Labs    10/27/19 1923  HGBA1C 6.5*   Lipid Profile No results for input(s): CHOL, HDL, LDLCALC, TRIG, CHOLHDL, LDLDIRECT in the last 72 hours. Thyroid function studies No results for input(s): TSH, T4TOTAL, T3FREE, THYROIDAB in the last 72  hours.  Invalid input(s): FREET3 Anemia work up No results for input(s): VITAMINB12, FOLATE, FERRITIN, TIBC, IRON, RETICCTPCT in the last 72 hours. Urinalysis    Component Value Date/Time   COLORURINE YELLOW 10/27/2019 1500   APPEARANCEUR HAZY (A) 10/27/2019 1500   LABSPEC 1.011 10/27/2019 1500   PHURINE 6.0 10/27/2019 1500   GLUCOSEU NEGATIVE 10/27/2019 1500   GLUCOSEU NEGATIVE 06/18/2019 0837   HGBUR SMALL (A) 10/27/2019 1500   BILIRUBINUR NEGATIVE 10/27/2019 1500   BILIRUBINUR Neg 08/26/2018 1053   KETONESUR NEGATIVE 10/27/2019 1500   PROTEINUR NEGATIVE 10/27/2019 1500   UROBILINOGEN 0.2 06/18/2019 0837   NITRITE NEGATIVE 10/27/2019 1500   LEUKOCYTESUR TRACE (A) 10/27/2019 1500   Sepsis Labs Invalid input(s): PROCALCITONIN,  WBC,  LACTICIDVEN Microbiology Recent Results (from the past 240 hour(s))  SARS CORONAVIRUS 2 (TAT 6-24 HRS) Nasopharyngeal Nasopharyngeal Swab     Status: None   Collection Time: 10/27/19  5:53 PM   Specimen: Nasopharyngeal Swab  Result Value Ref Range Status   SARS Coronavirus 2 NEGATIVE NEGATIVE Final    Comment: (NOTE) SARS-CoV-2 target nucleic acids are NOT DETECTED. The SARS-CoV-2 RNA is generally detectable in upper and lower respiratory specimens during the acute phase of infection. Negative results do not preclude SARS-CoV-2 infection, do not rule out co-infections with other pathogens, and should not be used as the sole basis for treatment or other patient management  decisions. Negative results must be combined with clinical observations, patient history, and epidemiological information. The expected result is Negative. Fact Sheet for Patients: SugarRoll.be Fact Sheet for Healthcare Providers: https://www.woods-[place].com/ This test is not yet approved or cleared by the [place] FDA and  has been authorized for detection and/or diagnosis of SARS-CoV-2 by FDA under an Emergency Use Authorization (EUA). This EUA will remain  in effect (meaning this test can be used) for the duration of the COVID-19 declaration under Section 56 4(b)(1) of the Act, 21 U.S.C. section 360bbb-3(b)(1), unless the authorization is terminated or revoked sooner. Performed at [facility], [place] [address], [place], [place] [address]   Culture, blood (routine x 2)     Status: None (Preliminary result)   Collection Time: 10/27/19  7:23 PM   Specimen: BLOOD  Result Value Ref Range Status   Specimen Description   Final    BLOOD SITE NOT SPECIFIED Performed at [facility] [address], [place], [place] [address]    Special Requests   Final    BOTTLES DRAWN AEROBIC AND ANAEROBIC Blood Culture adequate volume Performed at [facility] [address] [place], [place] [address]    Culture   Final    NO GROWTH < 24 HOURS Performed at [facility] [address], [place], [place] [address]    Report Status PENDING  Incomplete  Culture, blood (routine x 2)     Status: None (Preliminary result)   Collection Time: 10/27/19  8:11 PM   Specimen: BLOOD  Result Value Ref Range Status   Specimen Description   Final    BLOOD LEFT WRIST Performed at [facility] [address] [place], [place] [address]    Special Requests   Final    BOTTLES DRAWN AEROBIC ONLY Blood Culture adequate volume Performed at [facility] [address],  [place], [place] [address]    Culture   Final    NO GROWTH < 24 HOURS Performed at [facility] [place],  [place] [address]    Report Status PENDING  Incomplete     Time coordinating discharge:  39 minutes  SIGNED:   Georgette Shell, MD  [facility] 10/29/2019, 10:13 AM Pager   If 7PM-7AM, please contact night-coverage [email or web address] Password [email or web address]

## 2019-10-30 ENCOUNTER — Telehealth: Payer: Self-pay | Admitting: *Deleted

## 2019-10-30 LAB — LEGIONELLA PNEUMOPHILA SEROGP 1 UR AG: L. pneumophila Serogp 1 Ur Ag: NEGATIVE

## 2019-10-30 NOTE — Telephone Encounter (Signed)
Transition Care Management Follow-up Telephone Call   Date discharged? 10/29/19   How have you been since you were released from the hospital? Pt states she is doing alright   Do you understand why you were in the hospital? YES   Do you understand the discharge instructions? YES   Where were you discharged to? Home   Items Reviewed:  Medications reviewed: YES, no longer taking Nexium  Allergies reviewed: YES  Dietary changes reviewed: YES, carb modified & hearty healthy  Referrals reviewed: No referral recommeded   Functional Questionnaire:   Activities of Daily Living (ADLs):   She states she are independent in the following: bathing and hygiene, feeding, continence, grooming, toileting and dressing States they require assistance with the following: ambulation   Any transportation issues/concerns?: NO   Any patient concerns? NO   Confirmed importance and date/time of follow-up visits scheduled YES appt 11/05/19  Provider Appointment booked with Dr. [name]  Confirmed with patient if condition begins to worsen call PCP or go to the ER.  Patient was given the office number and encouraged to call back with question or concerns.  : YES

## 2019-11-01 LAB — CULTURE, BLOOD (ROUTINE X 2)
Culture: NO GROWTH
Culture: NO GROWTH
Special Requests: ADEQUATE
Special Requests: ADEQUATE

## 2019-11-04 DIAGNOSIS — J189 Pneumonia, unspecified organism: Secondary | ICD-10-CM | POA: Diagnosis not present

## 2019-11-04 DIAGNOSIS — J449 Chronic obstructive pulmonary disease, unspecified: Secondary | ICD-10-CM | POA: Diagnosis not present

## 2019-11-05 ENCOUNTER — Encounter: Payer: Self-pay | Admitting: Internal Medicine

## 2019-11-05 ENCOUNTER — Ambulatory Visit (INDEPENDENT_AMBULATORY_CARE_PROVIDER_SITE_OTHER): Payer: Medicare HMO | Admitting: Internal Medicine

## 2019-11-05 ENCOUNTER — Other Ambulatory Visit: Payer: Self-pay

## 2019-11-05 VITALS — BP 124/72 | HR 85 | Temp 98.4°F | Ht 59.0 in | Wt 122.6 lb

## 2019-11-05 DIAGNOSIS — I10 Essential (primary) hypertension: Secondary | ICD-10-CM | POA: Diagnosis not present

## 2019-11-05 DIAGNOSIS — J449 Chronic obstructive pulmonary disease, unspecified: Secondary | ICD-10-CM | POA: Diagnosis not present

## 2019-11-05 DIAGNOSIS — R0781 Pleurodynia: Secondary | ICD-10-CM

## 2019-11-05 NOTE — Progress Notes (Signed)
   Subjective:   Patient ID: Sherry Lambert, female    DOB: 1941/05/05, 79 y.o.   MRN: [id number]  HPI The patient is a 79 YO female coming in for hospital follow up (in for pneumonia and acute respiratory failure, given antibiotics and PT). She is currently off antibiotics and breathing is doing okay. Still using oxygen at exertion. Having some cough which is stable. Denies SOB as much. Able to walk around house. Using walker. Denies falls. Appetite is okay. Denies fevers or chills. Denies abdominal problems.   PMH, [facility], social history reviewed and updated  Review of Systems  Constitutional: Positive for activity change and fatigue. Negative for appetite change, chills and unexpected weight change.  HENT: Negative.   Eyes: Negative.   Respiratory: Negative for cough, chest tightness and shortness of breath.   Cardiovascular: Negative for chest pain, palpitations and leg swelling.  Gastrointestinal: Negative for abdominal distention, abdominal pain, constipation, diarrhea, nausea and vomiting.  Musculoskeletal: Negative.   Skin: Negative.   Neurological: Negative.   Psychiatric/Behavioral: Negative.     Objective:  Physical Exam Constitutional:      Appearance: She is well-developed.  HENT:     Head: Normocephalic and atraumatic.  Cardiovascular:     Rate and Rhythm: Normal rate and regular rhythm.  Pulmonary:     Effort: Pulmonary effort is normal. No respiratory distress.     Breath sounds: Normal breath sounds. No wheezing or rales.     Comments: Some coarse sounds stable from prior Abdominal:     General: Bowel sounds are normal. There is no distension.     Palpations: Abdomen is soft.     Tenderness: There is no abdominal tenderness. There is no rebound.  Musculoskeletal:     Cervical back: Normal range of motion.  Skin:    General: Skin is warm and dry.  Neurological:     Mental Status: She is alert and oriented to person, place, and time. Mental status is at baseline.      Coordination: Coordination abnormal.     Comments: Walker to ambulate     Vitals:   11/05/19 0820  BP: 124/72  Pulse: 85  Temp: 98.4 F (36.9 C)  TempSrc: Oral  SpO2: 99%  Weight: 122 lb 9.6 oz (55.6 kg)  Height: 4\' 11"  (1.499 m)    This visit occurred during the SARS-CoV-2 public health emergency.  Safety protocols were in place, including screening questions prior to the visit, additional usage of staff PPE, and extensive cleaning of exam room while observing appropriate contact time as indicated for disinfecting solutions.   Assessment & Plan:

## 2019-11-05 NOTE — Patient Instructions (Addendum)
We will get you in with the lung specialist and see if we can get you off the oxygen.  COVID-19 Vaccine Information can be found at: ShippingScam.co.uk For questions related to vaccine distribution or appointments, please email vaccine@ [email or web address] or call [phone].

## 2019-11-06 DIAGNOSIS — J449 Chronic obstructive pulmonary disease, unspecified: Secondary | ICD-10-CM | POA: Insufficient documentation

## 2019-11-06 NOTE — Assessment & Plan Note (Signed)
Refer to pulmonary as she is having recurrent flares. Taking trelegy and using albuterol nebulizer currently.

## 2019-11-06 NOTE — Assessment & Plan Note (Signed)
BP at goal today.

## 2019-11-06 NOTE — Assessment & Plan Note (Signed)
Improving overall.

## 2019-11-20 DIAGNOSIS — E785 Hyperlipidemia, unspecified: Secondary | ICD-10-CM | POA: Diagnosis not present

## 2019-11-20 DIAGNOSIS — K219 Gastro-esophageal reflux disease without esophagitis: Secondary | ICD-10-CM | POA: Diagnosis not present

## 2019-11-20 DIAGNOSIS — R030 Elevated blood-pressure reading, without diagnosis of hypertension: Secondary | ICD-10-CM | POA: Diagnosis not present

## 2019-11-20 DIAGNOSIS — J439 Emphysema, unspecified: Secondary | ICD-10-CM | POA: Diagnosis not present

## 2019-11-20 DIAGNOSIS — E1151 Type 2 diabetes mellitus with diabetic peripheral angiopathy without gangrene: Secondary | ICD-10-CM | POA: Diagnosis not present

## 2019-11-20 DIAGNOSIS — E1142 Type 2 diabetes mellitus with diabetic polyneuropathy: Secondary | ICD-10-CM | POA: Diagnosis not present

## 2019-11-20 DIAGNOSIS — I69351 Hemiplegia and hemiparesis following cerebral infarction affecting right dominant side: Secondary | ICD-10-CM | POA: Diagnosis not present

## 2019-11-20 DIAGNOSIS — H547 Unspecified visual loss: Secondary | ICD-10-CM | POA: Diagnosis not present

## 2019-11-20 DIAGNOSIS — Z7951 Long term (current) use of inhaled steroids: Secondary | ICD-10-CM | POA: Diagnosis not present

## 2019-11-20 DIAGNOSIS — G8929 Other chronic pain: Secondary | ICD-10-CM | POA: Diagnosis not present

## 2019-11-24 ENCOUNTER — Telehealth: Payer: Self-pay | Admitting: Internal Medicine

## 2019-11-24 NOTE — Telephone Encounter (Signed)
Patient states Dr.Crawford informed her to call if her BP was running high.  BP is 145/79 Please follow up with patient. Thank you.

## 2019-11-26 DIAGNOSIS — J189 Pneumonia, unspecified organism: Secondary | ICD-10-CM | POA: Diagnosis not present

## 2019-11-26 DIAGNOSIS — J449 Chronic obstructive pulmonary disease, unspecified: Secondary | ICD-10-CM | POA: Diagnosis not present

## 2019-11-30 ENCOUNTER — Ambulatory Visit: Payer: Medicare HMO | Admitting: Adult Health

## 2019-11-30 NOTE — Telephone Encounter (Signed)
Can continue to monitor

## 2019-11-30 NOTE — Telephone Encounter (Signed)
Pt advised to monitor her BP levels, she stated it has decreased.

## 2019-12-05 DIAGNOSIS — J449 Chronic obstructive pulmonary disease, unspecified: Secondary | ICD-10-CM | POA: Diagnosis not present

## 2019-12-05 DIAGNOSIS — J189 Pneumonia, unspecified organism: Secondary | ICD-10-CM | POA: Diagnosis not present

## 2019-12-08 ENCOUNTER — Ambulatory Visit: Payer: Medicare HMO | Admitting: Adult Health

## 2019-12-14 ENCOUNTER — Ambulatory Visit: Payer: Medicare HMO | Admitting: Adult Health

## 2019-12-22 ENCOUNTER — Ambulatory Visit: Payer: Medicare HMO | Admitting: Critical Care Medicine

## 2019-12-22 ENCOUNTER — Other Ambulatory Visit: Payer: Self-pay

## 2019-12-22 ENCOUNTER — Encounter: Payer: Self-pay | Admitting: Critical Care Medicine

## 2019-12-22 VITALS — BP 138/64 | HR 96 | Temp 97.9°F | Ht 59.0 in | Wt 119.0 lb

## 2019-12-22 DIAGNOSIS — J439 Emphysema, unspecified: Secondary | ICD-10-CM

## 2019-12-22 DIAGNOSIS — R5381 Other malaise: Secondary | ICD-10-CM

## 2019-12-22 MED ORDER — TRELEGY ELLIPTA 100-62.5-25 MCG/INH IN AEPB: 1.0000 | INHALATION_SPRAY | Freq: Every day | RESPIRATORY_TRACT | 11 refills | Status: DC

## 2019-12-22 NOTE — Patient Instructions (Addendum)
Thank you for visiting Dr. Carlis Abbott at [facility]. We recommend the following:   Meds ordered this encounter  Medications  . Fluticasone-Umeclidin-Vilant (TRELEGY ELLIPTA) 100-62.5-25 MCG/INH AEPB    Sig: Inhale 1 puff into the lungs daily.    Dispense:  28 each    Refill:  11    Return in about 3 months (around 03/22/2020).    Please do your part to reduce the spread of COVID-19.

## 2019-12-22 NOTE — Progress Notes (Signed)
Synopsis: Referred in 2017 for COPD by [name], *. Formerly a patient of Dr. Murlean Iba.  Subjective:   PATIENT ID: Sherry Lambert GENDER: female DOB: 1940/12/13, MRN: [id number]  Chief Complaint  Patient presents with  . Consult    SOB with walking. No cough    Sherry Lambert is a 79 year old woman who presents for evaluation of COPD.  She is accompanied today by her son.  She was diagnosed with COPD about 5 years ago and was previously followed by Dr. Murlean Iba.  She remains on Trelegy inhaler once daily, which she has been on prior to hospitalization.  She was hospitalized from 2/23-2/25 for an acute COPD exacerbation due to community-acquired pneumonia.  She has had 1 previous hospitalization in the past for pneumonia.  She had fallen on her right side about a week prior and had not been taking deep breaths or coughing due to severe pain.  Since her hospitalization she does not feel that her breathing is back to baseline.  She denies cough and sputum production, but has dyspnea on exertion, especially when taking the steps and has occasional wheezing.  She can walk around room to room in her house, but has trouble walking from the waiting room to the exam room.  She has dyspnea even when walking around her house.  She tries to be active doing exercises when she sitting down due to her risk of falls.  She uses her albuterol about half the days of the week, usually 1-2 times per day.  She quit smoking 2004 after 35 years x 1 pack/day.  She has a sister with emphysema and thinks that her mother likely had it as well.  She has chronic edema for which she wears compression socks which is at baseline.  No previous cardiac history.  She is up-to-date on her vaccines.    Past Medical History:  Diagnosis Date  . 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 [name]   . Cataract   . Chronic kidney disease    kidney stone  . Colitis   . Colon cancer ([place]) 06/2017   cecum  . Diabetes  mellitus without complication ([place])    no meds taken  . Emphysema of lung ([place])   . GERD (gastroesophageal reflux disease)   . Hyperlipidemia   . Hypertension   . Peripheral arterial disease ([place])   . [name]   . Stroke [facility])    November 2016- Thanksgiving night     Family History  Problem Relation Age of Onset  . Hypertension Father   . Diabetes Father   . Cancer Father        melanoma  . Emphysema Mother   . Colon cancer Sister 76  . Cancer Sister        colon, surgery alone   . Emphysema Sister   . Early death Neg Hx   . Stroke Neg Hx   . Esophageal cancer Neg Hx   . Rectal cancer Neg Hx   . Stomach cancer Neg Hx      Past Surgical History:  Procedure Laterality Date  . ABDOMINAL HYSTERECTOMY    . BALLOON DILATION N/A 12/23/2012   Procedure: BALLOON DILATION;  Surgeon: Inda Castle, MD;  Location: [facility];  Service: Endoscopy;  Laterality: N/A;  . BALLOON DILATION N/A 01/15/2013   Procedure: BALLOON DILATION;  Surgeon: Inda Castle, MD;  Location: [facility];  Service: Endoscopy;  Laterality: N/A;  . BALLOON  DILATION N/A 07/22/2018   Procedure: BALLOON DILATION;  Surgeon: Mauri Pole, MD;  Location: [facility];  Service: Endoscopy;  Laterality: N/A;  . cataract     both eyes  . COLONOSCOPY    . COLONOSCOPY WITH PROPOFOL N/A 03/05/2017   Procedure: COLONOSCOPY WITH PROPOFOL;  Surgeon: Mauri Pole, MD;  Location: [facility];  Service: Endoscopy;  Laterality: N/A;  . DENTAL RESTORATION/EXTRACTION WITH X-RAY    . ESOPHAGOGASTRODUODENOSCOPY N/A 12/23/2012   Procedure: ESOPHAGOGASTRODUODENOSCOPY (EGD);  Surgeon: Inda Castle, MD;  Location: [facility];  Service: Endoscopy;  Laterality: N/A;  . ESOPHAGOGASTRODUODENOSCOPY N/A 01/15/2013   Procedure: ESOPHAGOGASTRODUODENOSCOPY (EGD);  Surgeon: Inda Castle, MD;  Location: [facility];  Service: Endoscopy;  Laterality: N/A;  . ESOPHAGOGASTRODUODENOSCOPY (EGD) WITH PROPOFOL N/A  07/22/2018   Procedure: ESOPHAGOGASTRODUODENOSCOPY (EGD) WITH PROPOFOL;  Surgeon: Mauri Pole, MD;  Location: [facility];  Service: Endoscopy;  Laterality: N/A;  . TONSILLECTOMY      Social History   Socioeconomic History  . Marital status: Divorced    Spouse name: Not on file  . Number of children: 1  . Years of education: Not on file  . Highest education level: Not on file  Occupational History  . Occupation: Retired    Comment: Tax inspector  Tobacco Use  . Smoking status: Former Smoker    Packs/day: 1.00    Years: 55.00    Pack years: 55.00    Types: Cigarettes    Quit date: 10/19/2002    Years since quitting: 17.1  . Smokeless tobacco: Never Used  Substance and Sexual Activity  . Alcohol use: No    Alcohol/week: 0.0 standard drinks  . Drug use: No  . Sexual activity: Never    Birth control/protection: Surgical  Other Topics Concern  . Not on file  Social History Narrative   Regular exercise-no   Caffeine Use-yes   Social Determinants of Health   Financial Resource Strain:   . Difficulty of Paying Living Expenses:   Food Insecurity:   . Worried About Charity fundraiser in the Last Year:   . Arboriculturist in the Last Year:   Transportation Needs:   . Film/video editor (Medical):   Marland Kitchen Lack of Transportation (Non-Medical):   Physical Activity:   . Days of Exercise per Week:   . Minutes of Exercise per Session:   Stress:   . Feeling of Stress :   Social Connections:   . Frequency of Communication with Friends and Family:   . Frequency of Social Gatherings with Friends and Family:   . Attends Religious Services:   . Active Member of Clubs or Organizations:   . Attends Archivist Meetings:   Marland Kitchen Marital Status:   Intimate Partner Violence:   . Fear of Current or Ex-Partner:   . Emotionally Abused:   Marland Kitchen Physically Abused:   . Sexually Abused:      Allergies  Allergen Reactions  . Lipitor [Atorvastatin] Itching     Immunization  History  Administered Date(s) Administered  . Fluad Quad(high Dose 65+) 06/18/2019  . Influenza, High Dose Seasonal PF 07/27/2013, 05/14/2016, 05/20/2017, 06/05/2018  . Influenza,inj,Quad PF,6+ Mos 08/03/2014, 06/21/2015  . Influenza,inj,quad, With Preservative 06/04/2019  . Influenza-Unspecified 07/04/2012  . PFIZER SARS-COV-2 Vaccination 11/23/2019, 12/08/2019  . Pneumococcal Conjugate-13 11/11/2015  . Pneumococcal-Unspecified 09/03/2010  . Td 09/03/2012    Outpatient Medications Prior to Visit  Medication Sig Dispense Refill  . acetaminophen (TYLENOL) 500 MG tablet  Take 500 mg by mouth daily.    Marland Kitchen albuterol (VENTOLIN HFA) 108 (90 Base) MCG/ACT inhaler Inhale 1-2 puffs into the lungs every 4 (four) hours as needed for wheezing or shortness of breath. 6.7 g 6  . aspirin EC 81 MG tablet Take 1 tablet (81 mg total) by mouth daily. 90 tablet 3  . Carboxymethylcellul-Glycerin (LUBRICATING EYE DROPS OP) Place 1 drop into both eyes daily as needed (dry eyes).    . Cholecalciferol (VITAMIN D3) 125 MCG (5000 UT) CAPS Take 5,000 Units by mouth daily.    . Doxylamine Succinate, Sleep, (SLEEP AID PO) Take 1 tablet by mouth at bedtime as needed (sleep).    . gabapentin (NEURONTIN) 300 MG capsule TAKE 1 CAPSULE BY MOUTH THREE TIMES DAILY (Patient taking differently: Take 300 mg by mouth 3 (three) times daily. ) 90 capsule 5  . HYDROcodone-acetaminophen (NORCO/VICODIN) 5-325 MG tablet Take 1 tablet by mouth every 8 (eight) hours as needed. 15 tablet 0  . ipratropium-albuterol (DUONEB) 0.5-2.5 (3) MG/3ML SOLN Take 3 mLs by nebulization every 4 (four) hours as needed. 360 mL 6  . lansoprazole (PREVACID SOLUTAB) 30 MG disintegrating tablet Take 1 tablet (30 mg total) by mouth 2 (two) times daily. 60 tablet 11  . pravastatin (PRAVACHOL) 20 MG tablet Take 1 tablet by mouth once daily 90 tablet 1  . promethazine-dextromethorphan (PROMETHAZINE-DM) 6.25-15 MG/5ML syrup Take 5 mLs by mouth 2 (two) times daily as  needed for cough. 118 mL 0  . traMADol (ULTRAM) 50 MG tablet TAKE 1 TABLET BY MOUTH ONCE DAILY AS NEEDED FOR MODERATE PAIN (Patient taking differently: Take 50 mg by mouth daily as needed for moderate pain. ) 30 tablet 0  . TRELEGY ELLIPTA 100-62.5-25 MCG/INH AEPB Inhale 1 puff by mouth once daily (Patient taking differently: Inhale 1 puff into the lungs daily at 12 noon. ) 60 each 3   No facility-administered medications prior to visit.    Review of Systems  Constitutional: Negative for chills and fever.  HENT: Negative.   Respiratory: Positive for shortness of breath and wheezing. Negative for cough and sputum production.   Cardiovascular: Positive for leg swelling. Negative for chest pain.  Gastrointestinal: Negative for heartburn, nausea and vomiting.  Skin: Negative for rash.  Endo/Heme/Allergies: Negative for environmental allergies.     Objective:   Vitals:   12/22/19 1556  BP: 138/64  Pulse: 96  Temp: 97.9 F (36.6 C)  TempSrc: Temporal  SpO2: 98%  Weight: 119 lb (54 kg)  Height: 4\' 11"  (1.499 m)   98% on  RA BMI Readings from Last 3 Encounters:  12/22/19 24.04 kg/m  11/05/19 24.76 kg/m  10/27/19 24.94 kg/m   Wt Readings from Last 3 Encounters:  12/22/19 119 lb (54 kg)  11/05/19 122 lb 9.6 oz (55.6 kg)  10/27/19 123 lb 8 oz (56 kg)    Physical Exam Vitals reviewed.  Constitutional:      Appearance: She is not ill-appearing.     Comments: Frail-appearing elderly woman in no acute distress  HENT:     Head: Normocephalic and atraumatic.  Eyes:     General: No scleral icterus. Cardiovascular:     Rate and Rhythm: Normal rate and regular rhythm.     Heart sounds: No murmur.  Pulmonary:     Comments: Breathing comfortably on room air, no conversational dyspnea.  Clear to auscultation bilaterally, prolonged exhalation. Abdominal:     General: There is no distension.     Palpations: Abdomen is  soft.     Tenderness: There is no abdominal tenderness.    Musculoskeletal:        General: No deformity.     Cervical back: Neck supple.     Comments: Kyphosis, minimal bilateral lower extremity edema  Lymphadenopathy:     Cervical: No cervical adenopathy.  Skin:    General: Skin is warm and dry.     Findings: No rash.  Neurological:     General: No focal deficit present.     Mental Status: She is alert.     Coordination: Coordination normal.     Comments: Ambulates with a walker  Psychiatric:        Mood and Affect: Mood normal.        Behavior: Behavior normal.      CBC    Component Value Date/Time   WBC 8.9 10/28/2019 0457   RBC 4.14 10/28/2019 0457   HGB 12.2 10/28/2019 0457   HGB 11.4 (L) 07/30/2017 1116   HCT 39.4 10/28/2019 0457   HCT 36.1 07/30/2017 1116   PLT 336 10/28/2019 0457   PLT 252 07/30/2017 1116   MCV 95.2 10/28/2019 0457   MCV 88.6 07/30/2017 1116   MCH 29.5 10/28/2019 0457   MCHC 31.0 10/28/2019 0457   RDW 13.0 10/28/2019 0457   RDW 14.3 07/30/2017 1116   LYMPHSABS 1.0 10/28/2019 0457   LYMPHSABS 1.5 07/30/2017 1116   MONOABS 0.5 10/28/2019 0457   MONOABS 0.5 07/30/2017 1116   EOSABS 0.0 10/28/2019 0457   EOSABS 0.1 07/30/2017 1116   BASOSABS 0.0 10/28/2019 0457   BASOSABS 0.1 07/30/2017 1116    CHEMISTRY No results for input(s): NA, K, CL, CO2, GLUCOSE, BUN, CREATININE, CALCIUM, MG, PHOS in the last 168 hours. CrCl cannot be calculated (Patient's most recent lab result is older than the maximum 21 days allowed.).   Chest Imaging- films reviewed: CTA Chest 10/27/2019-emphysema, bilateral dependent pleural effusions.  No PE, no significant mediastinal or hilar adenopathy.  Hernia with stomach and abdominal fat contents protruding into the mediastinum.  Pulmonary Functions Testing Results: PFT Results Latest Ref Rng & Units 02/13/2016  FVC-Pre L 1.82  FVC-Predicted Pre % 76  FVC-Post L 2.02  FVC-Predicted Post % 84  Pre FEV1/FVC % % 45  Post FEV1/FCV % % 47  FEV1-Pre L 0.82  FEV1-Predicted  Pre % 46  FEV1-Post L 0.94  DLCO UNC% % 51  DLCO COR %Predicted % 64   2017- severe obstruction with out significant bronchodilator reversibility.  Moderate diffusion impairment.  Flow volume loop supports obstruction.  2019 spirometry: FVC 1.8 (75%) FEV1 0.7 (39%) Ratio 38%      Assessment & Plan:     ICD-10-CM   1. Pulmonary emphysema, unspecified emphysema type ([place])  J43.9   2. Debility  R53.81     COPD with recent exacerbation due to community-acquired pneumonia.  Pneumonia precipitated by suboptimal pulmonary hygiene due to splinting from rib pain. -Discussed the risks and benefits of ICS and COPD.  Given that her pneumonia was more likely precipitated by her poor pulmonary hygiene, it seems reasonable to continue.  The patient and her son wish to continue on Trelegy rather than to [place]. -Continue Trelegy once daily.  Rinse her mouth after use.  Refills provided. -Continue albuterol every 4 hours as needed -Continue regular physical activity as tolerated.  When discussing pulmonary rehab, her son does not think she would be a good candidate due to her weakness. -Up-to-date on seasonal flu, pneumonia, Covid  vaccines  Debility -Continue regular physical activity to maintain exercise tolerance -Continue to use her walker  History of tobacco abuse -Can discuss low-dose screening CT in 2022; had CT in February 2021 while hospitalized  RTC in 3 months.   Current Outpatient Medications:  .  acetaminophen (TYLENOL) 500 MG tablet, Take 500 mg by mouth daily., Disp: , Rfl:  .  albuterol (VENTOLIN HFA) 108 (90 Base) MCG/ACT inhaler, Inhale 1-2 puffs into the lungs every 4 (four) hours as needed for wheezing or shortness of breath., Disp: 6.7 g, Rfl: 6 .  aspirin EC 81 MG tablet, Take 1 tablet (81 mg total) by mouth daily., Disp: 90 tablet, Rfl: 3 .  Carboxymethylcellul-Glycerin (LUBRICATING EYE DROPS OP), Place 1 drop into both eyes daily as needed (dry eyes)., Disp: , Rfl:  .   Cholecalciferol (VITAMIN D3) 125 MCG (5000 UT) CAPS, Take 5,000 Units by mouth daily., Disp: , Rfl:  .  Doxylamine Succinate, Sleep, (SLEEP AID PO), Take 1 tablet by mouth at bedtime as needed (sleep)., Disp: , Rfl:  .  gabapentin (NEURONTIN) 300 MG capsule, TAKE 1 CAPSULE BY MOUTH THREE TIMES DAILY (Patient taking differently: Take 300 mg by mouth 3 (three) times daily. ), Disp: 90 capsule, Rfl: 5 .  HYDROcodone-acetaminophen (NORCO/VICODIN) 5-325 MG tablet, Take 1 tablet by mouth every 8 (eight) hours as needed., Disp: 15 tablet, Rfl: 0 .  ipratropium-albuterol (DUONEB) 0.5-2.5 (3) MG/3ML SOLN, Take 3 mLs by nebulization every 4 (four) hours as needed., Disp: 360 mL, Rfl: 6 .  lansoprazole (PREVACID SOLUTAB) 30 MG disintegrating tablet, Take 1 tablet (30 mg total) by mouth 2 (two) times daily., Disp: 60 tablet, Rfl: 11 .  pravastatin (PRAVACHOL) 20 MG tablet, Take 1 tablet by mouth once daily, Disp: 90 tablet, Rfl: 1 .  promethazine-dextromethorphan (PROMETHAZINE-DM) 6.25-15 MG/5ML syrup, Take 5 mLs by mouth 2 (two) times daily as needed for cough., Disp: 118 mL, Rfl: 0 .  traMADol (ULTRAM) 50 MG tablet, TAKE 1 TABLET BY MOUTH ONCE DAILY AS NEEDED FOR MODERATE PAIN (Patient taking differently: Take 50 mg by mouth daily as needed for moderate pain. ), Disp: 30 tablet, Rfl: 0 .  Fluticasone-Umeclidin-Vilant (TRELEGY ELLIPTA) 100-62.5-25 MCG/INH AEPB, Inhale 1 puff into the lungs daily., Disp: 60 each, Rfl: [place] Rashaun Wichert, DO [facility] 12/22/2019 4:39 PM

## 2019-12-24 ENCOUNTER — Other Ambulatory Visit: Payer: Self-pay | Admitting: Internal Medicine

## 2019-12-27 DIAGNOSIS — J189 Pneumonia, unspecified organism: Secondary | ICD-10-CM | POA: Diagnosis not present

## 2019-12-27 DIAGNOSIS — J449 Chronic obstructive pulmonary disease, unspecified: Secondary | ICD-10-CM | POA: Diagnosis not present

## 2019-12-31 ENCOUNTER — Telehealth: Payer: Self-pay | Admitting: Internal Medicine

## 2019-12-31 NOTE — Telephone Encounter (Signed)
New message:   Pt is calling and states she was told to let the Dr know if her BP started running high. She states as of today her BP reading is 148/69. Please advise.

## 2020-01-01 ENCOUNTER — Telehealth: Payer: Self-pay | Admitting: Internal Medicine

## 2020-01-01 NOTE — Telephone Encounter (Signed)
Called spoke with patient about below message

## 2020-01-01 NOTE — Telephone Encounter (Signed)
    Patient requesting d'c order for home oxygen tanks be faxed to [place] Phone  [phone]

## 2020-01-01 NOTE — Telephone Encounter (Signed)
Called patient 2x's unable to leave voicemail

## 2020-01-01 NOTE — Telephone Encounter (Signed)
See message.

## 2020-01-01 NOTE — Telephone Encounter (Signed)
Please advise 

## 2020-01-01 NOTE — Telephone Encounter (Signed)
This is acceptable range and she can continue to monitor for now. Call back for >150/80 consistently.

## 2020-01-04 DIAGNOSIS — J189 Pneumonia, unspecified organism: Secondary | ICD-10-CM | POA: Diagnosis not present

## 2020-01-04 DIAGNOSIS — J449 Chronic obstructive pulmonary disease, unspecified: Secondary | ICD-10-CM | POA: Diagnosis not present

## 2020-01-07 NOTE — Telephone Encounter (Signed)
F/u  The patient decided to continue with the home oxygen, disregard the previous messages.

## 2020-01-07 NOTE — Telephone Encounter (Signed)
Would need formal assessment to see if she still needs oxygen. Just saw pulmonary on 12/22/19 if they asked her to stop then she should call them to D/C.

## 2020-01-14 ENCOUNTER — Inpatient Hospital Stay (HOSPITAL_COMMUNITY)
Admission: EM | Admit: 2020-01-14 | Discharge: 2020-01-16 | DRG: 195 | Disposition: A | Discharge status: Home / Self Care | Payer: Medicare HMO | Attending: Family Medicine | Admitting: Family Medicine

## 2020-01-14 ENCOUNTER — Emergency Department (HOSPITAL_COMMUNITY): Payer: Medicare HMO

## 2020-01-14 ENCOUNTER — Telehealth: Payer: Self-pay | Admitting: Critical Care Medicine

## 2020-01-14 ENCOUNTER — Encounter (HOSPITAL_COMMUNITY): Payer: Self-pay | Admitting: Emergency Medicine

## 2020-01-14 ENCOUNTER — Other Ambulatory Visit: Payer: Self-pay

## 2020-01-14 DIAGNOSIS — Z20828 Contact with and (suspected) exposure to other viral communicable diseases: Secondary | ICD-10-CM | POA: Diagnosis not present

## 2020-01-14 DIAGNOSIS — I129 Hypertensive chronic kidney disease with stage 1 through stage 4 chronic kidney disease, or unspecified chronic kidney disease: Secondary | ICD-10-CM | POA: Diagnosis present

## 2020-01-14 DIAGNOSIS — E1151 Type 2 diabetes mellitus with diabetic peripheral angiopathy without gangrene: Secondary | ICD-10-CM | POA: Diagnosis present

## 2020-01-14 DIAGNOSIS — Z7982 Long term (current) use of aspirin: Secondary | ICD-10-CM

## 2020-01-14 DIAGNOSIS — Z87891 Personal history of nicotine dependence: Secondary | ICD-10-CM

## 2020-01-14 DIAGNOSIS — G894 Chronic pain syndrome: Secondary | ICD-10-CM | POA: Diagnosis present

## 2020-01-14 DIAGNOSIS — Z7951 Long term (current) use of inhaled steroids: Secondary | ICD-10-CM

## 2020-01-14 DIAGNOSIS — D72829 Elevated white blood cell count, unspecified: Secondary | ICD-10-CM | POA: Diagnosis not present

## 2020-01-14 DIAGNOSIS — R6510 Systemic inflammatory response syndrome (SIRS) of non-infectious origin without acute organ dysfunction: Secondary | ICD-10-CM | POA: Diagnosis not present

## 2020-01-14 DIAGNOSIS — Z825 Family history of asthma and other chronic lower respiratory diseases: Secondary | ICD-10-CM | POA: Diagnosis not present

## 2020-01-14 DIAGNOSIS — Z888 Allergy status to other drugs, medicaments and biological substances status: Secondary | ICD-10-CM | POA: Diagnosis not present

## 2020-01-14 DIAGNOSIS — Z9049 Acquired absence of other specified parts of digestive tract: Secondary | ICD-10-CM | POA: Diagnosis not present

## 2020-01-14 DIAGNOSIS — K449 Diaphragmatic hernia without obstruction or gangrene: Secondary | ICD-10-CM | POA: Diagnosis not present

## 2020-01-14 DIAGNOSIS — R509 Fever, unspecified: Secondary | ICD-10-CM | POA: Diagnosis not present

## 2020-01-14 DIAGNOSIS — Z8249 Family history of ischemic heart disease and other diseases of the circulatory system: Secondary | ICD-10-CM | POA: Diagnosis not present

## 2020-01-14 DIAGNOSIS — J439 Emphysema, unspecified: Secondary | ICD-10-CM | POA: Diagnosis present

## 2020-01-14 DIAGNOSIS — K219 Gastro-esophageal reflux disease without esophagitis: Secondary | ICD-10-CM | POA: Diagnosis present

## 2020-01-14 DIAGNOSIS — Z800 Family history of malignant neoplasm of digestive organs: Secondary | ICD-10-CM | POA: Diagnosis not present

## 2020-01-14 DIAGNOSIS — J189 Pneumonia, unspecified organism: Secondary | ICD-10-CM | POA: Diagnosis not present

## 2020-01-14 DIAGNOSIS — J181 Lobar pneumonia, unspecified organism: Secondary | ICD-10-CM | POA: Diagnosis not present

## 2020-01-14 DIAGNOSIS — Z833 Family history of diabetes mellitus: Secondary | ICD-10-CM | POA: Diagnosis not present

## 2020-01-14 DIAGNOSIS — R197 Diarrhea, unspecified: Secondary | ICD-10-CM | POA: Diagnosis not present

## 2020-01-14 DIAGNOSIS — M8580 Other specified disorders of bone density and structure, unspecified site: Secondary | ICD-10-CM | POA: Diagnosis present

## 2020-01-14 DIAGNOSIS — N1831 Chronic kidney disease, stage 3a: Secondary | ICD-10-CM | POA: Diagnosis present

## 2020-01-14 DIAGNOSIS — K529 Noninfective gastroenteritis and colitis, unspecified: Secondary | ICD-10-CM | POA: Diagnosis not present

## 2020-01-14 DIAGNOSIS — E1122 Type 2 diabetes mellitus with diabetic chronic kidney disease: Secondary | ICD-10-CM | POA: Diagnosis not present

## 2020-01-14 DIAGNOSIS — Z8673 Personal history of transient ischemic attack (TIA), and cerebral infarction without residual deficits: Secondary | ICD-10-CM

## 2020-01-14 DIAGNOSIS — Z85038 Personal history of other malignant neoplasm of large intestine: Secondary | ICD-10-CM

## 2020-01-14 DIAGNOSIS — E785 Hyperlipidemia, unspecified: Secondary | ICD-10-CM | POA: Diagnosis present

## 2020-01-14 DIAGNOSIS — Z20822 Contact with and (suspected) exposure to covid-19: Secondary | ICD-10-CM | POA: Diagnosis present

## 2020-01-14 DIAGNOSIS — R519 Headache, unspecified: Secondary | ICD-10-CM | POA: Diagnosis not present

## 2020-01-14 LAB — COMPREHENSIVE METABOLIC PANEL
ALT: 16 U/L (ref 0–44)
AST: 24 U/L (ref 15–41)
Albumin: 4.1 g/dL (ref 3.5–5.0)
Alkaline Phosphatase: 77 U/L (ref 38–126)
Anion gap: 9 (ref 5–15)
BUN: 15 mg/dL (ref 8–23)
CO2: 27 mmol/L (ref 22–32)
Calcium: 9.6 mg/dL (ref 8.9–10.3)
Chloride: 101 mmol/L (ref 98–111)
Creatinine, Ser: 0.96 mg/dL (ref 0.44–1.00)
GFR calc Af Amer: >60 mL/min (ref >60)
GFR calc non Af Amer: 57 mL/min — ABNORMAL LOW (ref >60)
Glucose, Bld: 109 mg/dL — ABNORMAL HIGH (ref 70–99)
Potassium: 4.6 mmol/L (ref 3.5–5.1)
Sodium: 137 mmol/L (ref 135–145)
Total Bilirubin: 0.9 mg/dL (ref 0.3–1.2)
Total Protein: 7.7 g/dL (ref 6.5–8.1)

## 2020-01-14 LAB — URINALYSIS, ROUTINE W REFLEX MICROSCOPIC
Bacteria, UA: NONE SEEN
Bilirubin Urine: NEGATIVE
Glucose, UA: NEGATIVE mg/dL
Hgb urine dipstick: NEGATIVE
Ketones, ur: NEGATIVE mg/dL
Nitrite: NEGATIVE
Protein, ur: NEGATIVE mg/dL
Specific Gravity, Urine: 1.006 (ref 1.005–1.030)
pH: 8 (ref 5.0–8.0)

## 2020-01-14 LAB — CBC
HCT: 48.1 % — ABNORMAL HIGH (ref 36.0–46.0)
Hemoglobin: 15.1 g/dL — ABNORMAL HIGH (ref 12.0–15.0)
MCH: 29.2 pg (ref 26.0–34.0)
MCHC: 31.4 g/dL (ref 30.0–36.0)
MCV: 92.9 fL (ref 80.0–100.0)
Platelets: 262 10*3/uL (ref 150–400)
RBC: 5.18 MIL/uL — ABNORMAL HIGH (ref 3.87–5.11)
RDW: 13.6 % (ref 11.5–15.5)
WBC: 27 10*3/uL — ABNORMAL HIGH (ref 4.0–10.5)
nRBC: 0 % (ref 0.0–0.2)

## 2020-01-14 LAB — LIPASE, BLOOD: Lipase: 21 U/L (ref 11–51)

## 2020-01-14 LAB — LACTIC ACID, PLASMA
Lactic Acid, Venous: 1.5 mmol/L (ref 0.5–1.9)
Lactic Acid, Venous: 1 mmol/L (ref 0.5–1.9)

## 2020-01-14 LAB — SARS CORONAVIRUS 2 BY RT PCR (HOSPITAL ORDER, PERFORMED IN ~~LOC~~ HOSPITAL LAB): SARS Coronavirus 2: NEGATIVE

## 2020-01-14 MED ORDER — FLUTICASONE FUROATE-VILANTEROL 100-25 MCG/INH IN AEPB
1.0000 | INHALATION_SPRAY | Freq: Every day | RESPIRATORY_TRACT | Status: DC
  Administered 2020-01-15 – 2020-01-16 (×2): 1 via RESPIRATORY_TRACT
  Filled 2020-01-14: qty 28

## 2020-01-14 MED ORDER — FLUTICASONE-UMECLIDIN-VILANT 100-62.5-25 MCG/INH IN AEPB: 1.0000 | INHALATION_SPRAY | Freq: Every day | RESPIRATORY_TRACT | Status: DC

## 2020-01-14 MED ORDER — VITAMIN D 25 MCG (1000 UNIT) PO TABS
5000.0000 [IU] | ORAL_TABLET | Freq: Every day | ORAL | Status: DC
  Administered 2020-01-15 – 2020-01-16 (×2): 5000 [IU] via ORAL
  Filled 2020-01-14 (×2): qty 5

## 2020-01-14 MED ORDER — SODIUM CHLORIDE 0.9 % IV SOLN
1.0000 g | Freq: Once | INTRAVENOUS | Status: AC
  Administered 2020-01-14: 1 g via INTRAVENOUS
  Filled 2020-01-14: qty 10

## 2020-01-14 MED ORDER — ONDANSETRON HCL 4 MG PO TABS
4.0000 mg | ORAL_TABLET | Freq: Four times a day (QID) | ORAL | Status: DC | PRN
  Administered 2020-01-16: 4 mg via ORAL
  Filled 2020-01-14: qty 1

## 2020-01-14 MED ORDER — SODIUM CHLORIDE 0.9% FLUSH
3.0000 mL | Freq: Once | INTRAVENOUS | Status: AC
  Administered 2020-01-14: 3 mL via INTRAVENOUS

## 2020-01-14 MED ORDER — SODIUM CHLORIDE 0.9 % IV SOLN
500.0000 mg | Freq: Once | INTRAVENOUS | Status: AC
  Administered 2020-01-14: 500 mg via INTRAVENOUS
  Filled 2020-01-14: qty 500

## 2020-01-14 MED ORDER — ENOXAPARIN SODIUM 40 MG/0.4ML ~~LOC~~ SOLN
40.0000 mg | SUBCUTANEOUS | Status: DC
  Administered 2020-01-14 – 2020-01-15 (×2): 40 mg via SUBCUTANEOUS
  Filled 2020-01-14 (×2): qty 0.4

## 2020-01-14 MED ORDER — LACTATED RINGERS IV SOLN: INTRAVENOUS | Status: DC

## 2020-01-14 MED ORDER — SODIUM CHLORIDE 0.9 % IV SOLN
500.0000 mg | INTRAVENOUS | Status: DC
  Administered 2020-01-15: 500 mg via INTRAVENOUS
  Filled 2020-01-14: qty 500

## 2020-01-14 MED ORDER — IPRATROPIUM-ALBUTEROL 0.5-2.5 (3) MG/3ML IN SOLN: 3.0000 mL | RESPIRATORY_TRACT | Status: DC | PRN

## 2020-01-14 MED ORDER — ACETAMINOPHEN 325 MG PO TABS
650.0000 mg | ORAL_TABLET | Freq: Four times a day (QID) | ORAL | Status: DC | PRN
  Administered 2020-01-15 (×2): 650 mg via ORAL
  Filled 2020-01-14 (×2): qty 2

## 2020-01-14 MED ORDER — LACTATED RINGERS IV BOLUS
1000.0000 mL | Freq: Once | INTRAVENOUS | Status: AC
  Administered 2020-01-14: 1000 mL via INTRAVENOUS

## 2020-01-14 MED ORDER — ACETAMINOPHEN 650 MG RE SUPP
650.0000 mg | Freq: Four times a day (QID) | RECTAL | Status: DC | PRN
  Administered 2020-01-14: 650 mg via RECTAL
  Filled 2020-01-14: qty 1

## 2020-01-14 MED ORDER — FLUTICASONE FUROATE-VILANTEROL 100-25 MCG/INH IN AEPB
1.0000 | INHALATION_SPRAY | Freq: Every day | RESPIRATORY_TRACT | Status: DC
  Filled 2020-01-14: qty 28

## 2020-01-14 MED ORDER — UMECLIDINIUM BROMIDE 62.5 MCG/INH IN AEPB
1.0000 | INHALATION_SPRAY | Freq: Every day | RESPIRATORY_TRACT | Status: DC
  Administered 2020-01-15 – 2020-01-16 (×2): 1 via RESPIRATORY_TRACT
  Filled 2020-01-14: qty 7

## 2020-01-14 MED ORDER — GABAPENTIN 300 MG PO CAPS
300.0000 mg | ORAL_CAPSULE | Freq: Three times a day (TID) | ORAL | Status: DC
  Administered 2020-01-15 – 2020-01-16 (×4): 300 mg via ORAL
  Filled 2020-01-14 (×4): qty 1

## 2020-01-14 MED ORDER — PRAVASTATIN SODIUM 20 MG PO TABS
20.0000 mg | ORAL_TABLET | Freq: Every day | ORAL | Status: DC
  Administered 2020-01-15 – 2020-01-16 (×2): 20 mg via ORAL
  Filled 2020-01-14 (×2): qty 1

## 2020-01-14 MED ORDER — ASPIRIN EC 81 MG PO TBEC
81.0000 mg | DELAYED_RELEASE_TABLET | Freq: Every day | ORAL | Status: DC
  Administered 2020-01-15 – 2020-01-16 (×2): 81 mg via ORAL
  Filled 2020-01-14 (×2): qty 1

## 2020-01-14 MED ORDER — ONDANSETRON HCL 4 MG/2ML IJ SOLN: 4.0000 mg | Freq: Four times a day (QID) | INTRAMUSCULAR | Status: DC | PRN

## 2020-01-14 MED ORDER — METRONIDAZOLE IN NACL 5-0.79 MG/ML-% IV SOLN
500.0000 mg | Freq: Three times a day (TID) | INTRAVENOUS | Status: DC
  Administered 2020-01-15 – 2020-01-16 (×5): 500 mg via INTRAVENOUS
  Filled 2020-01-14 (×5): qty 100

## 2020-01-14 MED ORDER — IOHEXOL 300 MG/ML  SOLN
80.0000 mL | Freq: Once | INTRAMUSCULAR | Status: AC | PRN
  Administered 2020-01-14: 80 mL via INTRAVENOUS

## 2020-01-14 MED ORDER — METRONIDAZOLE IN NACL 5-0.79 MG/ML-% IV SOLN
500.0000 mg | Freq: Once | INTRAVENOUS | Status: AC
  Administered 2020-01-14: 500 mg via INTRAVENOUS
  Filled 2020-01-14: qty 100

## 2020-01-14 MED ORDER — SODIUM CHLORIDE 0.9 % IV SOLN
1.0000 g | INTRAVENOUS | Status: DC
  Administered 2020-01-15: 1 g via INTRAVENOUS
  Filled 2020-01-14: qty 10

## 2020-01-14 MED ORDER — UMECLIDINIUM BROMIDE 62.5 MCG/INH IN AEPB
1.0000 | INHALATION_SPRAY | Freq: Every day | RESPIRATORY_TRACT | Status: DC
  Filled 2020-01-14: qty 7

## 2020-01-14 NOTE — H&P (Signed)
TRH H&P    Patient Demographics:    Sherry Lambert, is a 79 y.o. female  MRN: [id number]  DOB - [birth year]  Admit Date - 01/14/2020  Referring MD/NP/PA: Nanda Quinton  Outpatient Primary MD for the patient is [name], MD  Patient coming from: Home  Chief complaint-diarrhea   HPI:    Sherry Lambert  is a 79 y.o. female, with medical history of colon cancer status post bowel resection, hypertension, hyperlipidemia, COPD presented with abnormal lab and diarrhea.  Patient says that she woke up this morning 2 AM with chills.  She started having diarrhea.  She had 4-5 episodes of loose BM.  Denies any blood in the stool.  She went to walk-in clinic and they did blood work which showed WBC of 24.9 and told her to come to ED.  She also had rapid Covid 19 antigen test which was negative.  Patient denies chest pain or shortness of breath.  Denies coughing up phlegm.  Denies nausea vomiting or diarrhea.  Denies dysuria.  Patient had low-grade fever 100.4 at urgent care.  In the ED, chest x-ray showed pneumonia, CT abdomen pelvis showed colitis.  Patient started on IV ceftriaxone, Zithromax, Flagyl.     Review of systems:    In addition to the HPI above,    All other systems reviewed and are negative.    Past History of the following :    Past Medical History:  Diagnosis Date  . 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 [name]   . Cataract   . Chronic kidney disease    kidney stone  . Colitis   . Colon cancer ([place]) 06/2017   cecum  . Diabetes mellitus without complication ([place])    no meds taken  . Emphysema of lung ([place])   . GERD (gastroesophageal reflux disease)   . Hyperlipidemia   . Hypertension   . Peripheral arterial disease ([place])   . [name]   . Stroke [facility])    November 2016- Thanksgiving night      Past Surgical History:  Procedure Laterality Date  .  ABDOMINAL HYSTERECTOMY    . BALLOON DILATION N/A 12/23/2012   Procedure: BALLOON DILATION;  Surgeon: Inda Castle, MD;  Location: [facility];  Service: Endoscopy;  Laterality: N/A;  . BALLOON DILATION N/A 01/15/2013   Procedure: BALLOON DILATION;  Surgeon: Inda Castle, MD;  Location: [facility];  Service: Endoscopy;  Laterality: N/A;  . BALLOON DILATION N/A 07/22/2018   Procedure: BALLOON DILATION;  Surgeon: Mauri Pole, MD;  Location: [facility];  Service: Endoscopy;  Laterality: N/A;  . cataract     both eyes  . COLONOSCOPY    . COLONOSCOPY WITH PROPOFOL N/A 03/05/2017   Procedure: COLONOSCOPY WITH PROPOFOL;  Surgeon: Mauri Pole, MD;  Location: [facility];  Service: Endoscopy;  Laterality: N/A;  . DENTAL RESTORATION/EXTRACTION WITH X-RAY    . ESOPHAGOGASTRODUODENOSCOPY N/A 12/23/2012   Procedure: ESOPHAGOGASTRODUODENOSCOPY (EGD);  Surgeon: Inda Castle, MD;  Location: [facility];  Service:  Endoscopy;  Laterality: N/A;  . ESOPHAGOGASTRODUODENOSCOPY N/A 01/15/2013   Procedure: ESOPHAGOGASTRODUODENOSCOPY (EGD);  Surgeon: Inda Castle, MD;  Location: [facility];  Service: Endoscopy;  Laterality: N/A;  . ESOPHAGOGASTRODUODENOSCOPY (EGD) WITH PROPOFOL N/A 07/22/2018   Procedure: ESOPHAGOGASTRODUODENOSCOPY (EGD) WITH PROPOFOL;  Surgeon: Mauri Pole, MD;  Location: [facility];  Service: Endoscopy;  Laterality: N/A;  . TONSILLECTOMY        Social History:      Social History   Tobacco Use  . Smoking status: Former Smoker    Packs/day: 1.00    Years: 55.00    Pack years: 55.00    Types: Cigarettes    Quit date: 10/19/2002    Years since quitting: 17.2  . Smokeless tobacco: Never Used  Substance Use Topics  . Alcohol use: No    Alcohol/week: 0.0 standard drinks       Family History :     Family History  Problem Relation Age of Onset  . Hypertension Father   . Diabetes Father   . Cancer Father        melanoma  . Emphysema Mother    . Colon cancer Sister 21  . Cancer Sister        colon, surgery alone   . Emphysema Sister   . Early death Neg Hx   . Stroke Neg Hx   . Esophageal cancer Neg Hx   . Rectal cancer Neg Hx   . Stomach cancer Neg Hx       Home Medications:   Prior to Admission medications   Medication Sig Start Date End Date Taking? Authorizing Provider  acetaminophen (TYLENOL) 500 MG tablet Take 500 mg by mouth daily.   Yes [provider]  albuterol (VENTOLIN HFA) 108 (90 Base) MCG/ACT inhaler Inhale 1-2 puffs into the lungs every 4 (four) hours as needed for wheezing or shortness of breath. 10/23/19  Yes [name], MD  aspirin EC 81 MG tablet Take 1 tablet (81 mg total) by mouth daily. 12/20/14  Yes Rosalin Hawking, MD  Carboxymethylcellul-Glycerin (LUBRICATING EYE DROPS OP) Place 1 drop into both eyes daily as needed (dry eyes).   Yes [provider]  Cholecalciferol (VITAMIN D3) 125 MCG (5000 UT) CAPS Take 5,000 Units by mouth daily.   Yes [provider]  Doxylamine Succinate, Sleep, (SLEEP AID PO) Take 1 tablet by mouth at bedtime as needed (sleep).   Yes [provider]  Fluticasone-Umeclidin-Vilant (TRELEGY ELLIPTA) 100-62.5-25 MCG/INH AEPB Inhale 1 puff into the lungs daily. 12/22/19  Yes Noemi Chapel P, DO  gabapentin (NEURONTIN) 300 MG capsule TAKE 1 CAPSULE BY MOUTH THREE TIMES DAILY Patient taking differently: Take 300 mg by mouth 3 (three) times daily.  12/25/19  Yes [name], MD  HYDROcodone-acetaminophen (NORCO/VICODIN) 5-325 MG tablet Take 1 tablet by mouth every 8 (eight) hours as needed. 10/23/19  Yes [name], MD  ipratropium-albuterol (DUONEB) 0.5-2.5 (3) MG/3ML SOLN Take 3 mLs by nebulization every 4 (four) hours as needed. Patient taking differently: Take 3 mLs by nebulization every 4 (four) hours as needed (for wheezing or shortness of breath).  10/29/19  Yes Georgette Shell, MD  lansoprazole (PREVACID SOLUTAB) 30  MG disintegrating tablet Take 1 tablet (30 mg total) by mouth 2 (two) times daily. 08/11/18  Yes Mauri Pole, MD  pravastatin (PRAVACHOL) 20 MG tablet Take 1 tablet by mouth once daily 10/21/19  Yes [name], MD  promethazine-dextromethorphan (PROMETHAZINE-DM) 6.25-15 MG/5ML syrup Take 5  mLs by mouth 2 (two) times daily as needed for cough. Patient not taking: Reported on 01/14/2020 08/05/19   Binnie Rail, MD  traMADol (ULTRAM) 50 MG tablet TAKE 1 TABLET BY MOUTH ONCE DAILY AS NEEDED FOR MODERATE PAIN Patient not taking: No sig reported 09/14/19   [name], MD     Allergies:     Allergies  Allergen Reactions  . Lipitor [Atorvastatin] Itching     Physical Exam:   Vitals  Blood pressure (!) 141/127, pulse (!) 103, temperature 99.9 F (37.7 C), temperature source Rectal, resp. rate (!) 23, SpO2 98 %.  1.  General: Appears in no acute distress  2. Psychiatric: Alert, oriented x4, no focal deficit noted  3. Neurologic: Cranial nerves II through XII grossly intact, no focal deficit noted  4. HEENMT:  Atraumatic normocephalic, extraocular muscles are intact  5. Respiratory : Clear to auscultation bilaterally, no wheezing or crackles auscultated  6. Cardiovascular : S1-S2, regular, no murmur auscultated  7. Gastrointestinal:  Abdomen is soft, nontender, no organomegaly  8. Skin:  No rashes noted  9.Musculoskeletal:  No edema in the lower extremities    Data Review:    CBC Recent Labs  Lab 01/14/20 1407  WBC 27.0*  HGB 15.1*  HCT 48.1*  PLT 262  MCV 92.9  MCH 29.2  MCHC 31.4  RDW 13.6   ------------------------------------------------------------------------------------------------------------------  Results for orders placed or performed during the hospital encounter of 01/14/20 (from the past 48 hour(s))  Lipase, blood     Status: None   Collection Time: 01/14/20  2:07 PM  Result Value Ref Range   Lipase 21 11 - 51 U/L      Comment: Performed at [facility], [place] [address] [place], [place] [address]  Comprehensive metabolic panel     Status: Abnormal   Collection Time: 01/14/20  2:07 PM  Result Value Ref Range   Sodium 137 135 - 145 mmol/L   Potassium 4.6 3.5 - 5.1 mmol/L   Chloride 101 98 - 111 mmol/L   CO2 27 22 - 32 mmol/L   Glucose, Bld 109 (H) 70 - 99 mg/dL    Comment: Glucose reference range applies only to samples taken after fasting for at least 8 hours.   BUN 15 8 - 23 mg/dL   Creatinine, Ser 0.96 0.44 - 1.00 mg/dL   Calcium 9.6 8.9 - 10.3 mg/dL   Total Protein 7.7 6.5 - 8.1 g/dL   Albumin 4.1 3.5 - 5.0 g/dL   AST 24 15 - 41 U/L   ALT 16 0 - 44 U/L   Alkaline Phosphatase 77 38 - 126 U/L   Total Bilirubin 0.9 0.3 - 1.2 mg/dL   GFR calc non Af Amer 57 (L) >60 mL/min   GFR calc Af Amer >60 >60 mL/min   Anion gap 9 5 - 15    Comment: Performed at [facility], [place] [address], [place], [place] [address]  CBC     Status: Abnormal   Collection Time: 01/14/20  2:07 PM  Result Value Ref Range   WBC 27.0 (H) 4.0 - 10.5 K/uL   RBC 5.18 (H) 3.87 - 5.11 MIL/uL   Hemoglobin 15.1 (H) 12.0 - 15.0 g/dL   HCT 48.1 (H) 36.0 - 46.0 %   MCV 92.9 80.0 - 100.0 fL   MCH 29.2 26.0 - 34.0 pg   MCHC 31.4 30.0 - 36.0 g/dL   RDW 13.6 11.5 - 15.5 %  Platelets 262 150 - 400 K/uL   nRBC 0.0 0.0 - 0.2 %    Comment: Performed at [facility], [place] [address], [place], [place] [address]  Lactic acid, plasma     Status: None   Collection Time: 01/14/20  3:05 PM  Result Value Ref Range   Lactic Acid, Venous 1.5 0.5 - 1.9 mmol/L    Comment: Performed at [facility], [place] [address] [place], [place] [address]  SARS Coronavirus 2 by RT PCR (hospital order, performed in [facility] hospital lab) Nasopharyngeal Nasopharyngeal Swab     Status: None   Collection Time: 01/14/20  3:12 PM   Specimen: Nasopharyngeal Swab  Result Value Ref Range    SARS Coronavirus 2 NEGATIVE NEGATIVE    Comment: (NOTE) SARS-CoV-2 target nucleic acids are NOT DETECTED. The SARS-CoV-2 RNA is generally detectable in upper and lower respiratory specimens during the acute phase of infection. The lowest concentration of SARS-CoV-2 viral copies this assay can detect is 250 copies / mL. A negative result does not preclude SARS-CoV-2 infection and should not be used as the sole basis for treatment or other patient management decisions.  A negative result may occur with improper specimen collection / handling, submission of specimen other than nasopharyngeal swab, presence of viral mutation(s) within the areas targeted by this assay, and inadequate number of viral copies (<250 copies / mL). A negative result must be combined with clinical observations, patient history, and epidemiological information. Fact Sheet for Patients:   StrictlyIdeas.no Fact Sheet for Healthcare Providers: BankingDealers.co.za This test is not yet approved or cleared  by the [place] FDA and has been authorized for detection and/or diagnosis of SARS-CoV-2 by FDA under an Emergency Use Authorization (EUA).  This EUA will remain in effect (meaning this test can be used) for the duration of the COVID-19 declaration under Section 564(b)(1) of the Act, 21 U.S.C. section 360bbb-3(b)(1), unless the authorization is terminated or revoked sooner. Performed at [facility], [place] [address], [place], [place] [address]   Urinalysis, Routine w reflex microscopic     Status: Abnormal   Collection Time: 01/14/20  3:30 PM  Result Value Ref Range   Color, Urine STRAW (A) YELLOW   APPearance CLEAR CLEAR   Specific Gravity, Urine 1.006 1.005 - 1.030   pH 8.0 5.0 - 8.0   Glucose, UA NEGATIVE NEGATIVE mg/dL   Hgb urine dipstick NEGATIVE NEGATIVE   Bilirubin Urine NEGATIVE NEGATIVE   Ketones, ur NEGATIVE NEGATIVE mg/dL    Protein, ur NEGATIVE NEGATIVE mg/dL   Nitrite NEGATIVE NEGATIVE   Leukocytes,Ua SMALL (A) NEGATIVE   RBC / HPF 0-5 0 - 5 RBC/hpf   WBC, UA 6-10 0 - 5 WBC/hpf   Bacteria, UA NONE SEEN NONE SEEN   Squamous Epithelial / LPF 0-5 0 - 5   Mucus PRESENT     Comment: Performed at [facility], [place] [address] [place],  [address]    Chemistries  Recent Labs  Lab 01/14/20 1407  NA 137  K 4.6  CL 101  CO2 27  GLUCOSE 109*  BUN 15  CREATININE 0.96  CALCIUM 9.6  AST 24  ALT 16  ALKPHOS 77  BILITOT 0.9   ------------------------------------------------------------------------------------------------------------------  ------------------------------------------------------------------------------------------------------------------ GFR: CrCl cannot be calculated (Unknown ideal weight.). Liver Function Tests: Recent Labs  Lab 01/14/20 1407  AST 24  ALT 16  ALKPHOS 77  BILITOT 0.9  PROT 7.7  ALBUMIN 4.1   Recent  Labs  Lab 01/14/20 1407  LIPASE 21   No results for input(s): AMMONIA in the last 168 hours. Coagulation Profile: No results for input(s): INR, PROTIME in the last 168 hours. Cardiac Enzymes: No results for input(s): CKTOTAL, CKMB, CKMBINDEX, TROPONINI in the last 168 hours. BNP (last 3 results) No results for input(s): PROBNP in the last 8760 hours. HbA1C: No results for input(s): HGBA1C in the last 72 hours. CBG: No results for input(s): GLUCAP in the last 168 hours. Lipid Profile: No results for input(s): CHOL, HDL, LDLCALC, TRIG, CHOLHDL, LDLDIRECT in the last 72 hours. Thyroid Function Tests: No results for input(s): TSH, T4TOTAL, FREET4, T3FREE, THYROIDAB in the last 72 hours. Anemia Panel: No results for input(s): VITAMINB12, FOLATE, FERRITIN, TIBC, IRON, RETICCTPCT in the last 72 hours.  --------------------------------------------------------------------------------------------------------------- Urine analysis:      Component Value Date/Time   COLORURINE STRAW (A) 01/14/2020 1530   APPEARANCEUR CLEAR 01/14/2020 1530   LABSPEC 1.006 01/14/2020 1530   PHURINE 8.0 01/14/2020 1530   GLUCOSEU NEGATIVE 01/14/2020 1530   GLUCOSEU NEGATIVE 06/18/2019 0837   HGBUR NEGATIVE 01/14/2020 1530   [place] 01/14/2020 1530   BILIRUBINUR Neg 08/26/2018 1053   KETONESUR NEGATIVE 01/14/2020 1530   PROTEINUR NEGATIVE 01/14/2020 1530   UROBILINOGEN 0.2 06/18/2019 0837   NITRITE NEGATIVE 01/14/2020 1530   LEUKOCYTESUR SMALL (A) 01/14/2020 1530      Imaging Results:    CT Abdomen Pelvis W Contrast  Result Date: 01/14/2020 CLINICAL DATA:  Diarrhea EXAM: CT ABDOMEN AND PELVIS WITH CONTRAST TECHNIQUE: Multidetector CT imaging of the abdomen and pelvis was performed using the standard protocol following bolus administration of intravenous contrast. CONTRAST:  74mL OMNIPAQUE IOHEXOL 300 MG/ML  SOLN COMPARISON:  CT 02/01/2018 FINDINGS: Lower chest: Extensive emphysematous changes noted in the lung bases. Patchy ground-glass opacity noted in the left lower lobe. No visible effusion. Hepatobiliary: No focal liver abnormality is seen. No gallstones, gallbladder wall thickening, or biliary dilatation. Pancreas: Mild atrophy and partial fatty replacement of the pancreas. No ductal dilatation or pancreatic inflammation. Spleen: Normal in size without focal abnormality. Adrenals/Urinary Tract: Normal adrenal glands. Kidneys enhance and excrete symmetrically. Suspect some mild cortical thinning and bilateral areas of renal cortical scarring. Scattered subcentimeter hypoattenuating foci in both kidney too small to fully characterize on CT imaging but statistically likely benign. No worrisome renal lesion, obstructive urolithiasis or hydronephrosis. Urinary bladder is unremarkable. Stomach/Bowel: Moderate hiatal hernia with additional protrusion of some mesenteric fat through the hiatus. Distal stomach is unremarkable. Air and  fluid-filled duodenal diverticulum is noted in the upper abdomen measuring up to 3.2 cm in size. No duodenal thickening or other acute abnormality is seen. No small bowel thickening or dilatation. Patient appears to be post partial right colectomy/ileocecectomy. Patent anastomosis in the right lower quadrant. Question some mild edematous mural thickening of the distal colon versus underdistention. No pneumatosis, extraluminal fluid or gas is seen Vascular/Lymphatic: Atherosclerotic calcification throughout the abdominal aorta and branch vessels without aneurysm or ectasia or proximal occlusion. Major venous structures are unremarkable. No enlarged abdominopelvic nodes. Reproductive: Uterus is surgically absent. No concerning adnexal lesions. Other: No abdominopelvic free fluid or free gas. No bowel containing hernias. Musculoskeletal: Anterior wedging of the T12 vertebral body is increased from comparison exam as well as new anterior wedging compression deformity of L1 though findings are overall age indeterminate favor more remote injury. Slightly progressive discogenic and facet degenerative changes in the included lumbar spine. Additional degenerative changes in the hips and pelvis. IMPRESSION: 1.  Question some mild edematous mural thickening of the distal colon versus underdistention. Correlate for symptoms of colitis. 2. Patient appears to be post partial right colectomy/ileocecectomy. Patent anastomosis in the right lower quadrant. 3. Moderate hiatal hernia. 4. Anterior wedging of the T12 vertebral body is increased from comparison exam as well as new anterior wedging compression deformity of L1 though. Findings are overall age indeterminate though appearance does favor more remote change. Correlate for point tenderness. 5. Patchy ground-glass opacity in the left lower lobe, could reflect an acute infectious or inflammatory process. 6. Aortic Atherosclerosis (ICD10-I70.0) 7. Emphysema (ICD10-J43.9).  Electronically Signed   By: Lovena Le M.D.   On: 01/14/2020 16:43   DG Chest Port 1 View  Result Date: 01/14/2020 CLINICAL DATA:  Chills, elevated white blood cell count EXAM: PORTABLE CHEST 1 VIEW COMPARISON:  10/27/2019 FINDINGS: Patchy area of increased density in the mid left lung. Increased density at the left lung base as well. No significant pleural effusion. Stable cardiomediastinal contours. IMPRESSION: Patchy atelectasis/consolidation in the mid mid and lower left lung. Electronically Signed   By: Macy Mis M.D.   On: 01/14/2020 15:02    My personal review of EKG: Rhythm NSR, nonspecific T wave normalities   Assessment & Plan:    Active Problems:   CAP (community acquired pneumonia)   1. Community-acquired pneumonia-chest x-ray shows patchy consolidation in the mid and lower left lung.  WBC 27,000.  She is not requiring oxygen.  Vitals are stable.  No tachypnea.  Patient started on ceftriaxone and Zithromax.  We will continue with antibiotics.  Follow blood culture results.  Lactic acid 1.5. 2. Colitis-CT abdomen pelvis shows mildly edematous mural thickening of distal colon, secondary colitis.  Started on IV Flagyl along with IV ceftriaxone as above.  We will keep her n.p.o. for now.  Consider starting clear liquid in a.m. if patient clinically stable. 3. COPD-no acute exacerbation at this time.  Continue DuoNeb every 4 hours as needed.  Continue Trelegy Ellipta. 4. Chronic pain-continue Neurontin.  Tylenol as needed.   DVT Prophylaxis-   Lovenox   AM Labs Ordered, also please review Full Orders  Family Communication: Admission, patients condition and plan of care including tests being ordered have been discussed with the patient and her daughter-in-law at bedside who indicate understanding and agree with the plan and Code Status.  Code Status: Full code  Admission status: Observation/Inpatient :The appropriate admission status for this patient is INPATIENT. Inpatient  status is judged to be reasonable and necessary in order to provide the required intensity of service to ensure the patient's safety. The patient's presenting symptoms, physical exam findings, and initial radiographic and laboratory data in the context of their chronic comorbidities is felt to place them at high risk for further clinical deterioration. Furthermore, it is not anticipated that the patient will be medically stable for discharge from the hospital within 2 midnights of admission. The following factors support the admission status of inpatient.     The patient's presenting symptoms include diarrhea. The worrisome physical exam findings include pneumonia, colitis. The initial radiographic and laboratory data are worrisome because of coliti  . The chronic co-morbidities include COPD       * I certify that at the point of admission it is my clinical judgment that the patient will require inpatient hospital care spanning beyond 2 midnights from the point of admission due to high intensity of service, high risk for further deterioration and high frequency of surveillance required.*  Time spent in minutes : 60 minutes   Edrie Ehrich S Donn Wilmot M.D

## 2020-01-14 NOTE — ED Triage Notes (Signed)
Pt reports that she went to [facility] today due to having diarrhea and dry heaves through the night. Pt had an elevated WBC and advised to go to Ed for possible/close to sepsis.

## 2020-01-14 NOTE — Telephone Encounter (Signed)
Spoke with the pt's daughter-in-law, Maudie Mercury  She states calling as FYI to let Dr Carlis Abbott know pt is at [facility] ED  She woke up this morning with diarrhea and fever and went to her PCP- covid test was neg but her white count was up and they were only able to get a partial cxr  They are wondering if she could have pna  DIL states Dr Carlis Abbott wanted to be informed if she was hospitalized  Will forward to her to make her aware

## 2020-01-14 NOTE — ED Provider Notes (Signed)
[place] DEPT Provider Note   CSN: [id number] Arrival date & time: 01/14/20  1246     History Chief Complaint  Patient presents with   Abnormal Lab   Diarrhea    Sherry Lambert is a 79 y.o. female with history of colon cancer s/p bowel resection, HTN, HLD, COPD who presents with abnormal lab and diarrhea.  Patient states that she woke up this morning around 2 AM with chills.  She went to the bathroom and piled on blankets but still felt cold.  She then started to have several episodes of diarrhea.  She thinks she has about 3 episodes.  She states she did not look at the stool.  She also had intermittent nausea with dry heaves.  This morning she went to a walk-in clinic at [facility] urgent care and they did blood work which showed high white count of 24.9 and told her to come to the ED.  She states that she feels very thirsty and her lips are dry and she feels fatigued.  She denies URI symptoms, chest pain, shortness of breath, abdominal pain.  She has not had any further diarrhea since 5 AM this morning.  She denies any urinary symptoms.  She took 1 doxycycline this morning that she had leftover from a previous prescription but otherwise denies any recent antibiotic use.  She denies any sick contacts.  She had a rapid Covid test this morning which was negative.  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   [name]    Cataract    Chronic kidney disease    kidney stone   Colitis    Colon cancer ([place]) 06/2017   cecum   Diabetes mellitus without complication (HCC)    no meds taken   Emphysema of lung (HCC)    GERD (gastroesophageal reflux disease)    Hyperlipidemia    Hypertension    Peripheral arterial disease ([place])    [name]    Stroke [facility])    November 2016- Thanksgiving night    Patient Active Problem List   Diagnosis Date Noted   COPD, severe ([place])  11/06/2019   Community acquired pneumonia 10/27/2019   Rib pain 10/23/2019   Muscle cramps 07/07/2019   Esophageal dysphagia    Venous insufficiency 11/29/2017   Urinary incontinence 11/29/2017   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    Class: Acute   Cecum mass    Mass in rectum    PVD (peripheral vascular disease) ([place]) 09/14/2016   Essential hypertension 12/20/2014   Routine general medical examination at a health care facility 10/22/2014   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) 08/04/2014   Peripheral arterial disease ([place]) 01/05/2014   Osteopenia 11/12/2013   Hyperlipidemia associated with type 2 diabetes mellitus ([place]) 09/19/2013   Esophageal stricture 12/23/2012   Diabetes mellitus type 2, controlled, 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[place]) [id number]    Past Surgical History:  Procedure Laterality Date   ABDOMINAL HYSTERECTOMY     BALLOON DILATION N/A 12/23/2012   Procedure: BALLOON DILATION;  Surgeon: Inda Castle, MD;  Location: [facility] ENDOSCOPY;  Service: Endoscopy;  Laterality: N/A;   BALLOON DILATION N/A 01/15/2013   Procedure: BALLOON DILATION;  Surgeon: Inda Castle, MD;  Location: [facility] ENDOSCOPY;  Service: Endoscopy;  Laterality: N/A;   BALLOON DILATION N/A 07/22/2018   Procedure: BALLOON DILATION;  Surgeon: Mauri Pole, MD;  Location: [facility] ENDOSCOPY;  Service: Endoscopy;  Laterality: N/A;   cataract     both eyes   COLONOSCOPY     COLONOSCOPY WITH PROPOFOL N/A 03/05/2017   Procedure: COLONOSCOPY WITH PROPOFOL;  Surgeon: Mauri Pole, MD;  Location: [facility] ENDOS[facility];  Service: Endoscopy;  Laterality: N/A;   DENTAL RESTORATION/EXTRACTION WITH X-RAY     ESOPHAGOGASTRODUODENOSCOPY N/A 12/23/2012   Procedure: ESOPHAGOGASTRODUODENOSCOPY (EGD);  Surgeon: Inda Castle, MD;  Location: [facility];  Service: Endoscopy;  Laterality: N/A;   ESOPHAGOGASTRODUODENOSCOPY N/A 01/15/2013   Procedure: ESOPHAGOGASTRODUODENOSCOPY  (EGD);  Surgeon: Inda Castle, MD;  Location: [facility];  Service: Endoscopy;  Laterality: N/A;   ESOPHAGOGASTRODUODENOSCOPY (EGD) WITH PROPOFOL N/A 07/22/2018   Procedure: ESOPHAGOGASTRODUODENOSCOPY (EGD) WITH PROPOFOL;  Surgeon: Mauri Pole, MD;  Location: [facility];  Service: Endoscopy;  Laterality: N/A;   TONSILLECTOMY       OB History   No obstetric history on file.     Family History  Problem Relation Age of Onset   Hypertension Father    Diabetes Father    Cancer Father        melanoma   Emphysema Mother    Colon cancer Sister 77   Cancer Sister        colon, surgery alone    Emphysema Sister    Early death Neg Hx    Stroke Neg Hx    Esophageal cancer Neg Hx    Rectal cancer Neg Hx    Stomach cancer Neg Hx     Social History   Tobacco Use   Smoking status: Former Smoker    Packs/day: 1.00    Years: 55.00    Pack years: 55.00    Types: Cigarettes    Quit date: 10/19/2002    Years since quitting: 17.2   Smokeless tobacco: Never Used  Substance Use Topics   Alcohol use: No    Alcohol/week: 0.0 standard drinks   Drug use: No    Home Medications Prior to Admission medications   Medication Sig Start Date End Date Taking? Authorizing Provider  acetaminophen (TYLENOL) 500 MG tablet Take 500 mg by mouth daily.    [provider]  albuterol (VENTOLIN HFA) 108 (90 Base) MCG/ACT inhaler Inhale 1-2 puffs into the lungs every 4 (four) hours as needed for wheezing or shortness of breath. 10/23/19   [name], MD  aspirin EC 81 MG tablet Take 1 tablet (81 mg total) by mouth daily. 12/20/14   Rosalin Hawking, MD  Carboxymethylcellul-Glycerin (LUBRICATING EYE DROPS OP) Place 1 drop into both eyes daily as needed (dry eyes).    [provider]  Cholecalciferol (VITAMIN D3) 125 MCG (5000 UT) CAPS Take 5,000 Units by mouth daily.    [provider]  Doxylamine Succinate, Sleep, (SLEEP AID PO) Take 1 tablet by  mouth at bedtime as needed (sleep).    [provider]  Fluticasone-Umeclidin-Vilant (TRELEGY ELLIPTA) 100-62.5-25 MCG/INH AEPB Inhale 1 puff into the lungs daily. 12/22/19   Julian Hy, DO  gabapentin (NEURONTIN) 300 MG capsule TAKE 1 CAPSULE BY MOUTH THREE TIMES DAILY 12/25/19   [name], MD  HYDROcodone-acetaminophen (NORCO/VICODIN) 5-325 MG tablet Take 1 tablet by mouth every 8 (eight) hours as needed. 10/23/19   [name], MD  ipratropium-albuterol (DUONEB) 0.5-2.5 (3) MG/3ML SOLN Take 3 mLs by nebulization every 4 (four) hours as needed. 10/29/19   Georgette Shell, MD  lansoprazole (PREVACID SOLUTAB) 30 MG disintegrating tablet Take 1 tablet (30 mg total) by  mouth 2 (two) times daily. 08/11/18   Mauri Pole, MD  pravastatin (PRAVACHOL) 20 MG tablet Take 1 tablet by mouth once daily 10/21/19   [name], MD  promethazine-dextromethorphan (PROMETHAZINE-DM) 6.25-15 MG/5ML syrup Take 5 mLs by mouth 2 (two) times daily as needed for cough. 08/05/19   Burns, Claudina Lick, MD  traMADol (ULTRAM) 50 MG tablet TAKE 1 TABLET BY MOUTH ONCE DAILY AS NEEDED FOR MODERATE PAIN Patient taking differently: Take 50 mg by mouth daily as needed for moderate pain.  09/14/19   [name], MD    Allergies    Lipitor [atorvastatin]  Review of Systems   Review of Systems  Constitutional: Positive for chills and fatigue. Negative for fever.  Respiratory: Negative for cough, shortness of breath and wheezing.   Cardiovascular: Negative for chest pain.  Gastrointestinal: Positive for diarrhea, nausea and vomiting. Negative for abdominal pain.  Genitourinary: Negative for dysuria and flank pain.  All other systems reviewed and are negative.   Physical Exam Updated Vital Signs BP (!) 112/53    Pulse 89    Temp 98.5 F (36.9 C) (Oral)    Resp 18    SpO2 98%   Physical Exam Vitals and nursing note reviewed.  Constitutional:      General: She is not in  acute distress.    Appearance: Normal appearance. She is well-developed. She is not ill-appearing.     Comments: Elderly female in NAD. Fatigued appearing  HENT:     Head: Normocephalic and atraumatic.  Eyes:     General: No scleral icterus.       Right eye: No discharge.        Left eye: No discharge.     Conjunctiva/sclera: Conjunctivae normal.     Pupils: Pupils are equal, round, and reactive to light.  Cardiovascular:     Rate and Rhythm: Normal rate and regular rhythm.  Pulmonary:     Effort: Pulmonary effort is normal. No respiratory distress.     Breath sounds: Normal breath sounds.  Abdominal:     General: There is no distension.     Palpations: Abdomen is soft.     Tenderness: There is no abdominal tenderness.  Musculoskeletal:     Cervical back: Normal range of motion.  Skin:    General: Skin is warm and dry.  Neurological:     Mental Status: She is alert and oriented to person, place, and time.  Psychiatric:        Behavior: Behavior normal.     ED Results / Procedures / Treatments   Labs (all labs ordered are listed, but only abnormal results are displayed) Labs Reviewed  COMPREHENSIVE METABOLIC PANEL - Abnormal; Notable for the following components:      Result Value   Glucose, Bld 109 (*)    GFR calc non Af Amer 57 (*)    All other components within normal limits  CBC - Abnormal; Notable for the following components:   WBC 27.0 (*)    RBC 5.18 (*)    Hemoglobin 15.1 (*)    HCT 48.1 (*)    All other components within normal limits  URINALYSIS, ROUTINE W REFLEX MICROSCOPIC - Abnormal; Notable for the following components:   Color, Urine STRAW (*)    Leukocytes,Ua SMALL (*)    All other components within normal limits  SARS CORONAVIRUS 2 BY RT PCR (HOSPITAL ORDER, [place] [facility])  CULTURE, BLOOD (ROUTINE X 2)  CULTURE,  BLOOD (ROUTINE X 2)  LIPASE, BLOOD  LACTIC ACID, PLASMA  LACTIC ACID, PLASMA    EKG None  Radiology CT  Abdomen Pelvis W Contrast  Result Date: 01/14/2020 CLINICAL DATA:  Diarrhea EXAM: CT ABDOMEN AND PELVIS WITH CONTRAST TECHNIQUE: Multidetector CT imaging of the abdomen and pelvis was performed using the standard protocol following bolus administration of intravenous contrast. CONTRAST:  62mL OMNIPAQUE IOHEXOL 300 MG/ML  SOLN COMPARISON:  CT 02/01/2018 FINDINGS: Lower chest: Extensive emphysematous changes noted in the lung bases. Patchy ground-glass opacity noted in the left lower lobe. No visible effusion. Hepatobiliary: No focal liver abnormality is seen. No gallstones, gallbladder wall thickening, or biliary dilatation. Pancreas: Mild atrophy and partial fatty replacement of the pancreas. No ductal dilatation or pancreatic inflammation. Spleen: Normal in size without focal abnormality. Adrenals/Urinary Tract: Normal adrenal glands. Kidneys enhance and excrete symmetrically. Suspect some mild cortical thinning and bilateral areas of renal cortical scarring. Scattered subcentimeter hypoattenuating foci in both kidney too small to fully characterize on CT imaging but statistically likely benign. No worrisome renal lesion, obstructive urolithiasis or hydronephrosis. Urinary bladder is unremarkable. Stomach/Bowel: Moderate hiatal hernia with additional protrusion of some mesenteric fat through the hiatus. Distal stomach is unremarkable. Air and fluid-filled duodenal diverticulum is noted in the upper abdomen measuring up to 3.2 cm in size. No duodenal thickening or other acute abnormality is seen. No small bowel thickening or dilatation. Patient appears to be post partial right colectomy/ileocecectomy. Patent anastomosis in the right lower quadrant. Question some mild edematous mural thickening of the distal colon versus underdistention. No pneumatosis, extraluminal fluid or gas is seen Vascular/Lymphatic: Atherosclerotic calcification throughout the abdominal aorta and branch vessels without aneurysm or ectasia or  proximal occlusion. Major venous structures are unremarkable. No enlarged abdominopelvic nodes. Reproductive: Uterus is surgically absent. No concerning adnexal lesions. Other: No abdominopelvic free fluid or free gas. No bowel containing hernias. Musculoskeletal: Anterior wedging of the T12 vertebral body is increased from comparison exam as well as new anterior wedging compression deformity of L1 though findings are overall age indeterminate favor more remote injury. Slightly progressive discogenic and facet degenerative changes in the included lumbar spine. Additional degenerative changes in the hips and pelvis. IMPRESSION: 1. Question some mild edematous mural thickening of the distal colon versus underdistention. Correlate for symptoms of colitis. 2. Patient appears to be post partial right colectomy/ileocecectomy. Patent anastomosis in the right lower quadrant. 3. Moderate hiatal hernia. 4. Anterior wedging of the T12 vertebral body is increased from comparison exam as well as new anterior wedging compression deformity of L1 though. Findings are overall age indeterminate though appearance does favor more remote change. Correlate for point tenderness. 5. Patchy ground-glass opacity in the left lower lobe, could reflect an acute infectious or inflammatory process. 6. Aortic Atherosclerosis (ICD10-I70.0) 7. Emphysema (ICD10-J43.9). Electronically Signed   By: Lovena Le M.D.   On: 01/14/2020 16:43   DG Chest Port 1 View  Result Date: 01/14/2020 CLINICAL DATA:  Chills, elevated white blood cell count EXAM: PORTABLE CHEST 1 VIEW COMPARISON:  10/27/2019 FINDINGS: Patchy area of increased density in the mid left lung. Increased density at the left lung base as well. No significant pleural effusion. Stable cardiomediastinal contours. IMPRESSION: Patchy atelectasis/consolidation in the mid mid and lower left lung. Electronically Signed   By: Macy Mis M.D.   On: 01/14/2020 15:02    Procedures Procedures  (including critical care time)  Medications Ordered in ED Medications  azithromycin (ZITHROMAX) 500 mg in sodium chloride 0.9 %  250 mL IVPB (has no administration in time range)  metroNIDAZOLE (FLAGYL) IVPB 500 mg (has no administration in time range)  Fluticasone-Umeclidin-Vilant 100-62.5-25 MCG/INH AEPB 1 puff (has no administration in time range)  sodium chloride flush (NS) 0.9 % injection 3 mL (3 mLs Intravenous Given 01/14/20 1409)  lactated ringers bolus 1,000 mL (0 mLs Intravenous Stopped 01/14/20 1633)  iohexol (OMNIPAQUE) 300 MG/ML solution 80 mL (80 mLs Intravenous Contrast Given 01/14/20 1613)  cefTRIAXone (ROCEPHIN) 1 g in sodium chloride 0.9 % 100 mL IVPB (1 g Intravenous New Bag/Given 01/14/20 1801)    ED Course  I have reviewed the triage vital signs and the nursing notes.  Pertinent labs & imaging results that were available during my care of the patient were reviewed by me and considered in my medical decision making (see chart for details).  79 year old female presents with nasal congestion, runny nose, nausea, dry heaves and diarrhea this morning.  Rectal temp is 99.9.  She is hemodynamically stable with minimally elevated pulse.  On exam she is fatigued appearing.  Heart is regular rate and rhythm and lungs are clear to auscultation.  Abdomen is soft and nontender.  CBC shows significant leukocytosis of 27 here.  Lactate is normal.  CMP and lipase are normal.  Chest x-ray shows possible infiltrate in the left middle and lower lobe.  UA is not consistent with UTI and she has no urinary symptoms.  CT abdomen and pelvis was obtained which shows a possible colitis.  Discussed with pharmacy who recommends Rocephin, azithromycin, Flagyl.  She was started on antibiotics and given 1 L of fluids.  Covid test was obtained and is pending.  Case discussed with Dr. Laverta Baltimore.  Will admit for observation.  Discussed with Dr. Darrick Meigs who will admit.    MDM Rules/Calculators/A&P                        Final Clinical Impression(s) / ED Diagnoses Final diagnoses:  SIRS (systemic inflammatory response syndrome) ([place])  Community acquired pneumonia of left lower lobe of lung  Colitis    Rx / DC Orders ED Discharge Orders    None       Recardo Evangelist, PA-C 01/14/20 1837    Margette Fast, MD 01/15/20 [phone]

## 2020-01-14 NOTE — Progress Notes (Signed)
Patient has PO medications but diet order is NPO. Paged NP on call. She stated to hold medications overnight d/t colitis and MD will re-evaluate in the am.

## 2020-01-14 NOTE — Telephone Encounter (Signed)
Thanks Magda Paganini!  LPC

## 2020-01-15 LAB — CBC
HCT: 40.7 % (ref 36.0–46.0)
Hemoglobin: 12.6 g/dL (ref 12.0–15.0)
MCH: 28.6 pg (ref 26.0–34.0)
MCHC: 31 g/dL (ref 30.0–36.0)
MCV: 92.3 fL (ref 80.0–100.0)
Platelets: 204 10*3/uL (ref 150–400)
RBC: 4.41 MIL/uL (ref 3.87–5.11)
RDW: 13.9 % (ref 11.5–15.5)
WBC: 13.9 10*3/uL — ABNORMAL HIGH (ref 4.0–10.5)
nRBC: 0 % (ref 0.0–0.2)

## 2020-01-15 LAB — COMPREHENSIVE METABOLIC PANEL
ALT: 14 U/L (ref 0–44)
AST: 15 U/L (ref 15–41)
Albumin: 3.3 g/dL — ABNORMAL LOW (ref 3.5–5.0)
Alkaline Phosphatase: 61 U/L (ref 38–126)
Anion gap: 8 (ref 5–15)
BUN: 14 mg/dL (ref 8–23)
CO2: 25 mmol/L (ref 22–32)
Calcium: 9.4 mg/dL (ref 8.9–10.3)
Chloride: 107 mmol/L (ref 98–111)
Creatinine, Ser: 0.81 mg/dL (ref 0.44–1.00)
GFR calc Af Amer: >60 mL/min (ref >60)
GFR calc non Af Amer: >60 mL/min (ref >60)
Glucose, Bld: 110 mg/dL — ABNORMAL HIGH (ref 70–99)
Potassium: 3.9 mmol/L (ref 3.5–5.1)
Sodium: 140 mmol/L (ref 135–145)
Total Bilirubin: 1 mg/dL (ref 0.3–1.2)
Total Protein: 6.1 g/dL — ABNORMAL LOW (ref 6.5–8.1)

## 2020-01-15 MED ORDER — LIP MEDEX EX OINT
1.0000 "application " | TOPICAL_OINTMENT | CUTANEOUS | Status: DC | PRN
  Filled 2020-01-15: qty 7

## 2020-01-15 MED ORDER — MUSCLE RUB 10-15 % EX CREA
1.0000 "application " | TOPICAL_CREAM | CUTANEOUS | Status: DC | PRN
  Administered 2020-01-15: 1 via TOPICAL
  Filled 2020-01-15: qty 85

## 2020-01-15 MED ORDER — OXYCODONE HCL 5 MG PO TABS
5.0000 mg | ORAL_TABLET | Freq: Four times a day (QID) | ORAL | Status: DC | PRN
  Administered 2020-01-15 – 2020-01-16 (×2): 5 mg via ORAL
  Filled 2020-01-15 (×2): qty 1

## 2020-01-15 NOTE — Plan of Care (Signed)
  Problem: Clinical Measurements: Goal: Ability to maintain clinical measurements within normal limits will improve Outcome: Progressing Goal: Diagnostic test results will improve Outcome: Progressing   

## 2020-01-15 NOTE — TOC Progression Note (Signed)
Transition of Care [facility]) - Progression Note    Patient Details  Name: Sherry Lambert MRN: [id number] Date of Birth: [birth year]  [facility] CM/SW Contact  Purcell Mouton, RN Phone Number: 01/15/2020, 3:58 PM  Clinical Narrative:    Pt plan to discharge home. Her support is her son and daughter that lives behind her.    Expected Discharge Plan: Home/Self Care Barriers to Discharge: No Barriers Identified  Expected Discharge Plan and Services Expected Discharge Plan: Home/Self Care   Discharge Planning Services: CM Consult   Living arrangements for the past 2 months: Single Family Home                                       Social Determinants of Health (SDOH) Interventions    Readmission Risk Interventions No flowsheet data found.

## 2020-01-15 NOTE — Progress Notes (Signed)
[facility]  PROGRESS NOTE  Sherry Lambert [id number] DOB: Sep 19, 1940 DOA: 01/14/2020 PCP: [name], MD   Brief HPI:   79 y.o. female, with medical history of colon cancer status post bowel resection, hypertension, hyperlipidemia, COPD presented with abnormal lab and diarrhea.  Patient says that she woke up this morning 2 AM with chills.  She started having diarrhea.  She had 4-5 episodes of loose BM.  Denies any blood in the stool.  She went to walk-in clinic and they did blood work which showed WBC of 24.9 and told her to come to ED.  She also had rapid Covid 19 antigen test which was negative.  Patient denies chest pain or shortness of breath.  Denies coughing up phlegm.  Denies nausea vomiting or diarrhea.  Denies dysuria.  Patient had low-grade fever 100.4 at urgent care.    Subjective   Patient seen and examined, denies abdominal pain.  Had formed stool last night.   Assessment/Plan:     1. Community-acquired pneumonia-chest x-ray showed patchy consolidation in the mid and lower left lung.  WBC was  27,000.  Patient started on ceftriaxone and Zithromax.  WBC is now down to 13,000.  Follow blood culture results. 2. Colitis-CT abdomen/pelvis showed edematous mural thickening of distal colon suggesting colitis.  Patient started on IV Flagyl along with IV ceftriaxone as above.  Currently on clear liquid diet.  Patient is improving.  Will start full liquid diet and advance as tolerated. 3. COPD-no exacerbation at this time.  Continue DuoNeb every 4 hours as needed.  Continue Trelegy Ellipta. 4. Chronic pain syndrome-continue Neurontin.  Tylenol as needed.    SpO2: 95 %   COVID-19 Labs  No results for input(s): DDIMER, FERRITIN, LDH, CRP in the last 72 hours.  Lab Results  Component Value Date   SARSCOV2NAA NEGATIVE 01/14/2020   [place] NEGATIVE 10/27/2019     CBG: No results for input(s): GLUCAP in the last 168 hours.  CBC: Recent Labs  Lab  01/14/20 1407 01/15/20 0448  WBC 27.0* 13.9*  HGB 15.1* 12.6  HCT 48.1* 40.7  MCV 92.9 92.3  PLT 262 0000000    Basic Metabolic Panel: Recent Labs  Lab 01/14/20 1407 01/15/20 0448  NA 137 140  K 4.6 3.9  CL 101 107  CO2 27 25  GLUCOSE 109* 110*  BUN 15 14  CREATININE 0.96 0.81  CALCIUM 9.6 9.4     Liver Function Tests: Recent Labs  Lab 01/14/20 1407 01/15/20 0448  AST 24 15  ALT 16 14  ALKPHOS 77 61  BILITOT 0.9 1.0  PROT 7.7 6.1*  ALBUMIN 4.1 3.3*        DVT prophylaxis: Lovenox  Code Status: Full code  Family Communication: No family at bedside  Disposition Plan:   Status is: Inpatient  Dispo: The patient is from: Home              Anticipated d/c is to: Home              Anticipated d/c date is: 01/16/2020              Patient currently admitted with colitis, community-acquired pneumonia.  Barrier to discharge-patient is currently on IV antibiotics.  Plan to discharge home on 01/16/2020.        Scheduled medications:  . aspirin EC  81 mg Oral Daily  . cholecalciferol  5,000 Units Oral Daily  . enoxaparin (LOVENOX) injection  40 mg Subcutaneous Q24H  .  fluticasone furoate-vilanterol  1 puff Inhalation Daily   And  . umeclidinium bromide  1 puff Inhalation Daily  . gabapentin  300 mg Oral TID  . pravastatin  20 mg Oral Daily    Consultants:  None  Procedures:  None  Antibiotics:   Anti-infectives (From admission, onward)   Start     Dose/Rate Route Frequency Ordered Stop   01/15/20 1900  azithromycin (ZITHROMAX) 500 mg in sodium chloride 0.9 % 250 mL IVPB     500 mg 250 mL/hr over 60 Minutes Intravenous Every 24 hours 01/14/20 2112     01/15/20 1800  cefTRIAXone (ROCEPHIN) 1 g in sodium chloride 0.9 % 100 mL IVPB     1 g 200 mL/hr over 30 Minutes Intravenous Every 24 hours 01/14/20 2112     01/15/20 0200  metroNIDAZOLE (FLAGYL) IVPB 500 mg     500 mg 100 mL/hr over 60 Minutes Intravenous Every 8 hours 01/14/20 2112      01/14/20 1745  cefTRIAXone (ROCEPHIN) 1 g in sodium chloride 0.9 % 100 mL IVPB     1 g 200 mL/hr over 30 Minutes Intravenous  Once 01/14/20 1743 01/14/20 1841   01/14/20 1745  azithromycin (ZITHROMAX) 500 mg in sodium chloride 0.9 % 250 mL IVPB     500 mg 250 mL/hr over 60 Minutes Intravenous  Once 01/14/20 1743 01/14/20 2011   01/14/20 1745  metroNIDAZOLE (FLAGYL) IVPB 500 mg     500 mg 100 mL/hr over 60 Minutes Intravenous  Once 01/14/20 1743 01/14/20 1956       Objective   Vitals:   01/14/20 1930 01/14/20 2000 01/14/20 2058 01/15/20 0433  BP: (!) 132/56 (!) 129/55 (!) 130/56 (!) 136/58  Pulse: 87 88 96 74  Resp: (!) 21 (!) 24 19 17   Temp:   (!) 100.9 F (38.3 C) 98.1 F (36.7 C)  TempSrc:   Oral Oral  SpO2: 93% 94% 95% 95%    Intake/Output Summary (Last 24 hours) at 01/15/2020 1309 Last data filed at 01/15/2020 0324 Gross per 24 hour  Intake 418.7 ml  Output --  Net 418.7 ml    05/12 1901 - 05/14 0700 In: 418.7 [I.V.:318.7] Out: -   There were no vitals filed for this visit.  Physical Examination:   General-appears in no acute distress Heart-S1-S2, regular, no murmur auscultated Lungs-clear to auscultation bilaterally, no wheezing or crackles auscultated Abdomen-soft, nontender, no organomegaly Extremities-no edema in the lower extremities Neuro-alert, oriented x3, no focal deficit noted   Data Reviewed:   Recent Results (from the past 240 hour(s))  SARS Coronavirus 2 by RT PCR (hospital order, performed [facility]) Nasopharyngeal Nasopharyngeal Swab     Status: None   Collection Time: 01/14/20  3:12 PM   Specimen: Nasopharyngeal Swab  Result Value Ref Range Status   SARS Coronavirus 2 NEGATIVE NEGATIVE Final    Comment: (NOTE) SARS-CoV-2 target nucleic acids are NOT DETECTED. The SARS-CoV-2 RNA is generally detectable in upper and lower respiratory specimens during the acute phase of infection. The lowest concentration of SARS-CoV-2  viral copies this assay can detect is 250 copies / mL. A negative result does not preclude SARS-CoV-2 infection and should not be used as the sole basis for treatment or other patient management decisions.  A negative result may occur with improper specimen collection / handling, submission of specimen other than nasopharyngeal swab, presence of viral mutation(s) within the areas targeted by this assay, and inadequate number of viral  copies (<250 copies / mL). A negative result must be combined with clinical observations, patient history, and epidemiological information. Fact Sheet for Patients:   StrictlyIdeas.no Fact Sheet for Healthcare Providers: BankingDealers.co.za This test is not yet approved or cleared  by the [place] FDA and has been authorized for detection and/or diagnosis of SARS-CoV-2 by FDA under an Emergency Use Authorization (EUA).  This EUA will remain in effect (meaning this test can be used) for the duration of the COVID-19 declaration under Section 564(b)(1) of the Act, 21 U.S.C. section 360bbb-3(b)(1), unless the authorization is terminated or revoked sooner. Performed at [facility], [place] [address], [place], [place] [address]     Recent Labs  Lab 01/14/20 1407  LIPASE 21   No results for input(s): AMMONIA in the last 168 hours.  Cardiac Enzymes: No results for input(s): CKTOTAL, CKMB, CKMBINDEX, TROPONINI in the last 168 hours. BNP (last 3 results) Recent Labs    10/27/19 1127  BNP 451.1*    ProBNP (last 3 results) No results for input(s): PROBNP in the last 8760 hours.  Studies:  CT Abdomen Pelvis W Contrast  Result Date: 01/14/2020 CLINICAL DATA:  Diarrhea EXAM: CT ABDOMEN AND PELVIS WITH CONTRAST TECHNIQUE: Multidetector CT imaging of the abdomen and pelvis was performed using the standard protocol following bolus administration of intravenous contrast. CONTRAST:  72mL  OMNIPAQUE IOHEXOL 300 MG/ML  SOLN COMPARISON:  CT 02/01/2018 FINDINGS: Lower chest: Extensive emphysematous changes noted in the lung bases. Patchy ground-glass opacity noted in the left lower lobe. No visible effusion. Hepatobiliary: No focal liver abnormality is seen. No gallstones, gallbladder wall thickening, or biliary dilatation. Pancreas: Mild atrophy and partial fatty replacement of the pancreas. No ductal dilatation or pancreatic inflammation. Spleen: Normal in size without focal abnormality. Adrenals/Urinary Tract: Normal adrenal glands. Kidneys enhance and excrete symmetrically. Suspect some mild cortical thinning and bilateral areas of renal cortical scarring. Scattered subcentimeter hypoattenuating foci in both kidney too small to fully characterize on CT imaging but statistically likely benign. No worrisome renal lesion, obstructive urolithiasis or hydronephrosis. Urinary bladder is unremarkable. Stomach/Bowel: Moderate hiatal hernia with additional protrusion of some mesenteric fat through the hiatus. Distal stomach is unremarkable. Air and fluid-filled duodenal diverticulum is noted in the upper abdomen measuring up to 3.2 cm in size. No duodenal thickening or other acute abnormality is seen. No small bowel thickening or dilatation. Patient appears to be post partial right colectomy/ileocecectomy. Patent anastomosis in the right lower quadrant. Question some mild edematous mural thickening of the distal colon versus underdistention. No pneumatosis, extraluminal fluid or gas is seen Vascular/Lymphatic: Atherosclerotic calcification throughout the abdominal aorta and branch vessels without aneurysm or ectasia or proximal occlusion. Major venous structures are unremarkable. No enlarged abdominopelvic nodes. Reproductive: Uterus is surgically absent. No concerning adnexal lesions. Other: No abdominopelvic free fluid or free gas. No bowel containing hernias. Musculoskeletal: Anterior wedging of the T12  vertebral body is increased from comparison exam as well as new anterior wedging compression deformity of L1 though findings are overall age indeterminate favor more remote injury. Slightly progressive discogenic and facet degenerative changes in the included lumbar spine. Additional degenerative changes in the hips and pelvis. IMPRESSION: 1. Question some mild edematous mural thickening of the distal colon versus underdistention. Correlate for symptoms of colitis. 2. Patient appears to be post partial right colectomy/ileocecectomy. Patent anastomosis in the right lower quadrant. 3. Moderate hiatal hernia. 4. Anterior wedging of the T12 vertebral body is increased from comparison exam as well  as new anterior wedging compression deformity of L1 though. Findings are overall age indeterminate though appearance does favor more remote change. Correlate for point tenderness. 5. Patchy ground-glass opacity in the left lower lobe, could reflect an acute infectious or inflammatory process. 6. Aortic Atherosclerosis (ICD10-I70.0) 7. Emphysema (ICD10-J43.9). Electronically Signed   By: Lovena Le M.D.   On: 01/14/2020 16:43   DG Chest Port 1 View  Result Date: 01/14/2020 CLINICAL DATA:  Chills, elevated white blood cell count EXAM: PORTABLE CHEST 1 VIEW COMPARISON:  10/27/2019 FINDINGS: Patchy area of increased density in the mid left lung. Increased density at the left lung base as well. No significant pleural effusion. Stable cardiomediastinal contours. IMPRESSION: Patchy atelectasis/consolidation in the mid mid and lower left lung. Electronically Signed   By: Macy Mis M.D.   On: 01/14/2020 15:02       [place]   [facility] If 7PM-7AM, please contact night-coverage at [email or web address], Office  [phone]   01/15/2020, 1:09 PM  LOS: 1 day

## 2020-01-15 NOTE — TOC Progression Note (Signed)
Transition of Care [facility]) - Progression Note    Patient Details  Name: Sherry Lambert MRN: [id number] Date of Birth: 1941/06/22  [facility] CM/SW Contact  Purcell Mouton, RN Phone Number: 01/15/2020, 3:21 PM  Clinical Narrative:     Pt states that she has a walker at home and do not think she will need anything home health at present time. Pt had questions about her home O2, that she don't think she needs it. Pt was encouraged to call her PCP and [facility] concerning this situation. Pt states that she called her PCP and has not heard from PCP. Encouraged her to continue to call PCP and [facility].   Expected Discharge Plan: Home/Self Care Barriers to Discharge: No Barriers Identified  Expected Discharge Plan and Services Expected Discharge Plan: Home/Self Care   Discharge Planning Services: CM Consult   Living arrangements for the past 2 months: Single Family Home                                       Social Determinants of Health (SDOH) Interventions    Readmission Risk Interventions No flowsheet data found.

## 2020-01-16 LAB — CBC
HCT: 39.3 % (ref 36.0–46.0)
Hemoglobin: 12.2 g/dL (ref 12.0–15.0)
MCH: 28.8 pg (ref 26.0–34.0)
MCHC: 31 g/dL (ref 30.0–36.0)
MCV: 92.7 fL (ref 80.0–100.0)
Platelets: 211 10*3/uL (ref 150–400)
RBC: 4.24 MIL/uL (ref 3.87–5.11)
RDW: 13.6 % (ref 11.5–15.5)
WBC: 10.2 10*3/uL (ref 4.0–10.5)
nRBC: 0 % (ref 0.0–0.2)

## 2020-01-16 LAB — BASIC METABOLIC PANEL
Anion gap: 6 (ref 5–15)
BUN: 11 mg/dL (ref 8–23)
CO2: 26 mmol/L (ref 22–32)
Calcium: 8.7 mg/dL — ABNORMAL LOW (ref 8.9–10.3)
Chloride: 105 mmol/L (ref 98–111)
Creatinine, Ser: 0.79 mg/dL (ref 0.44–1.00)
GFR calc Af Amer: >60 mL/min (ref >60)
GFR calc non Af Amer: >60 mL/min (ref >60)
Glucose, Bld: 96 mg/dL (ref 70–99)
Potassium: 3.6 mmol/L (ref 3.5–5.1)
Sodium: 137 mmol/L (ref 135–145)

## 2020-01-16 MED ORDER — METRONIDAZOLE 500 MG PO TABS
500.0000 mg | ORAL_TABLET | Freq: Three times a day (TID) | ORAL | 0 refills | Status: AC
Start: 2020-01-16 — End: 2020-01-21

## 2020-01-16 MED ORDER — CEFUROXIME AXETIL 500 MG PO TABS: 500.0000 mg | ORAL_TABLET | Freq: Two times a day (BID) | ORAL | 0 refills | Status: DC

## 2020-01-16 NOTE — Progress Notes (Signed)
Patient given discharge instructions and all questions answered. Patient left via wheelchair to home with son.

## 2020-01-16 NOTE — Discharge Summary (Signed)
Physician Discharge Summary  Sherry Lambert [id number] DOB: 23-Apr-1941 DOA: 01/14/2020  PCP: [name], MD  Admit date: 01/14/2020 Discharge date: 01/16/2020  Time spent: 50 minutes  Recommendations for Outpatient Follow-up:  1. Follow-up PCP in 2 weeks   Discharge Diagnoses:  Active Problems:   CAP (community acquired pneumonia)   Discharge Condition: Stable  Diet recommendation: Heart healthy diet  Filed Weights   01/16/20 0100  Weight: 54 kg    History of present illness:  79 y.o.female,with medical history of colon cancer status post bowel resection, hypertension, hyperlipidemia, COPD presented with abnormal lab and diarrhea. Patient says that she woke up this morning 2 AM with chills. She started having diarrhea. She had 4-5 episodes of loose BM. Denies any blood in the stool. She went to walk-in clinic and they did blood work which showed WBC of 24.9 and told her to come to ED. She also had rapid Covid 19 antigen test which was negative. Patient denies chest pain or shortness of breath. Denies coughing up phlegm. Denies nausea vomiting or diarrhea. Denies dysuria. Patient had low-grade fever 100.4 at urgent care.   Hospital Course:  1. Community-acquired pneumonia-chest x-ray showed patchy consolidation in the mid and lower left lung.  WBC was  27,000.  Patient started on ceftriaxone and Zithromax.  WBC is now down to 8000 this morning.  Blood culture is negative to date.  Patient will be discharged on cefuroxime 500 mg p.o. twice daily for 5 more days. 2. Colitis-CT abdomen/pelvis showed edematous mural thickening of distal colon suggesting colitis.  Patient started on IV Flagyl along with IV ceftriaxone as above.    Diarrhea has resolved.  White count is back to normal.  Will discharge patient home on Flagyl 500 mg p.o. 3 times daily for 5 more days. 3. COPD-no exacerbation at this time.    Stable. Continue Trelegy Ellipta. 4. Chronic pain  syndrome-continue Neurontin.  Tylenol as needed.  Procedures:  None  Consultations:  None  Discharge Exam: Vitals:   01/15/20 2053 01/16/20 0617  BP: (!) 148/66 (!) 146/69  Pulse: 89 75  Resp: 18 18  Temp: (!) 97.3 F (36.3 C) 98.1 F (36.7 C)  SpO2: 95% 92%    General: Appears in no acute distress Cardiovascular: S1-S2, regular Respiratory: Clear to auscultation bilaterally Abdomen-soft, nontender, no organomegaly  Discharge Instructions   Discharge Instructions    Diet - low sodium heart healthy   Complete by: As directed    Increase activity slowly   Complete by: As directed      Allergies as of 01/16/2020      Reactions   Lipitor [atorvastatin] Itching      Medication List    STOP taking these medications   traMADol 50 MG tablet Commonly known as: ULTRAM     TAKE these medications   acetaminophen 500 MG tablet Commonly known as: TYLENOL Take 500 mg by mouth daily.   albuterol 108 (90 Base) MCG/ACT inhaler Commonly known as: VENTOLIN HFA Inhale 1-2 puffs into the lungs every 4 (four) hours as needed for wheezing or shortness of breath.   aspirin EC 81 MG tablet Take 1 tablet (81 mg total) by mouth daily.   cefUROXime 500 MG tablet Commonly known as: CEFTIN Take 1 tablet (500 mg total) by mouth 2 (two) times daily with a meal.   gabapentin 300 MG capsule Commonly known as: NEURONTIN TAKE 1 CAPSULE BY MOUTH THREE TIMES DAILY   HYDROcodone-acetaminophen 5-325 MG tablet Commonly  known as: NORCO/VICODIN Take 1 tablet by mouth every 8 (eight) hours as needed.   ipratropium-albuterol 0.5-2.5 (3) MG/3ML Soln Commonly known as: DUONEB Take 3 mLs by nebulization every 4 (four) hours as needed. What changed: reasons to take this   lansoprazole 30 MG disintegrating tablet Commonly known as: Prevacid SoluTab Take 1 tablet (30 mg total) by mouth 2 (two) times daily.   LUBRICATING EYE DROPS OP Place 1 drop into both eyes daily as needed (dry  eyes).   metroNIDAZOLE 500 MG tablet Commonly known as: Flagyl Take 1 tablet (500 mg total) by mouth 3 (three) times daily for 5 days.   pravastatin 20 MG tablet Commonly known as: PRAVACHOL Take 1 tablet by mouth once daily   promethazine-dextromethorphan 6.25-15 MG/5ML syrup Commonly known as: PROMETHAZINE-DM Take 5 mLs by mouth 2 (two) times daily as needed for cough.   SLEEP AID PO Take 1 tablet by mouth at bedtime as needed (sleep).   Trelegy Ellipta 100-62.5-25 MCG/INH Aepb Generic drug: Fluticasone-Umeclidin-Vilant Inhale 1 puff into the lungs daily.   Vitamin D3 125 MCG (5000 UT) Caps Take 5,000 Units by mouth daily.      Allergies  Allergen Reactions  . Lipitor [Atorvastatin] Itching   Follow-up Information    [name], MD Follow up in 2 week(s).   Specialty: Internal Medicine Contact information: [place] [address] [phone]            The results of significant diagnostics from this hospitalization (including imaging, microbiology, ancillary and laboratory) are listed below for reference.    Significant Diagnostic Studies: CT Abdomen Pelvis W Contrast  Result Date: 01/14/2020 CLINICAL DATA:  Diarrhea EXAM: CT ABDOMEN AND PELVIS WITH CONTRAST TECHNIQUE: Multidetector CT imaging of the abdomen and pelvis was performed using the standard protocol following bolus administration of intravenous contrast. CONTRAST:  66mL OMNIPAQUE IOHEXOL 300 MG/ML  SOLN COMPARISON:  CT 02/01/2018 FINDINGS: Lower chest: Extensive emphysematous changes noted in the lung bases. Patchy ground-glass opacity noted in the left lower lobe. No visible effusion. Hepatobiliary: No focal liver abnormality is seen. No gallstones, gallbladder wall thickening, or biliary dilatation. Pancreas: Mild atrophy and partial fatty replacement of the pancreas. No ductal dilatation or pancreatic inflammation. Spleen: Normal in size without focal abnormality.  Adrenals/Urinary Tract: Normal adrenal glands. Kidneys enhance and excrete symmetrically. Suspect some mild cortical thinning and bilateral areas of renal cortical scarring. Scattered subcentimeter hypoattenuating foci in both kidney too small to fully characterize on CT imaging but statistically likely benign. No worrisome renal lesion, obstructive urolithiasis or hydronephrosis. Urinary bladder is unremarkable. Stomach/Bowel: Moderate hiatal hernia with additional protrusion of some mesenteric fat through the hiatus. Distal stomach is unremarkable. Air and fluid-filled duodenal diverticulum is noted in the upper abdomen measuring up to 3.2 cm in size. No duodenal thickening or other acute abnormality is seen. No small bowel thickening or dilatation. Patient appears to be post partial right colectomy/ileocecectomy. Patent anastomosis in the right lower quadrant. Question some mild edematous mural thickening of the distal colon versus underdistention. No pneumatosis, extraluminal fluid or gas is seen Vascular/Lymphatic: Atherosclerotic calcification throughout the abdominal aorta and branch vessels without aneurysm or ectasia or proximal occlusion. Major venous structures are unremarkable. No enlarged abdominopelvic nodes. Reproductive: Uterus is surgically absent. No concerning adnexal lesions. Other: No abdominopelvic free fluid or free gas. No bowel containing hernias. Musculoskeletal: Anterior wedging of the T12 vertebral body is increased from comparison exam as well as new anterior wedging compression deformity  of L1 though findings are overall age indeterminate favor more remote injury. Slightly progressive discogenic and facet degenerative changes in the included lumbar spine. Additional degenerative changes in the hips and pelvis. IMPRESSION: 1. Question some mild edematous mural thickening of the distal colon versus underdistention. Correlate for symptoms of colitis. 2. Patient appears to be post partial  right colectomy/ileocecectomy. Patent anastomosis in the right lower quadrant. 3. Moderate hiatal hernia. 4. Anterior wedging of the T12 vertebral body is increased from comparison exam as well as new anterior wedging compression deformity of L1 though. Findings are overall age indeterminate though appearance does favor more remote change. Correlate for point tenderness. 5. Patchy ground-glass opacity in the left lower lobe, could reflect an acute infectious or inflammatory process. 6. Aortic Atherosclerosis (ICD10-I70.0) 7. Emphysema (ICD10-J43.9). Electronically Signed   By: Lovena Le M.D.   On: 01/14/2020 16:43   DG Chest Port 1 View  Result Date: 01/14/2020 CLINICAL DATA:  Chills, elevated white blood cell count EXAM: PORTABLE CHEST 1 VIEW COMPARISON:  10/27/2019 FINDINGS: Patchy area of increased density in the mid left lung. Increased density at the left lung base as well. No significant pleural effusion. Stable cardiomediastinal contours. IMPRESSION: Patchy atelectasis/consolidation in the mid mid and lower left lung. Electronically Signed   By: Macy Mis M.D.   On: 01/14/2020 15:02    Microbiology: Recent Results (from the past 240 hour(s))  Blood culture (routine x 2)     Status: None (Preliminary result)   Collection Time: 01/14/20  3:03 PM   Specimen: BLOOD RIGHT FOREARM  Result Value Ref Range Status   Specimen Description   Final    BLOOD RIGHT FOREARM Performed at [facility] [address], [place], [place] [address]    Special Requests   Final    BOTTLES DRAWN AEROBIC AND ANAEROBIC Blood Culture results may not be optimal due to an inadequate volume of blood received in culture bottles Performed at [facility] [address] [place], [place] [address]    Culture   Final    NO GROWTH 2 DAYS Performed at [facility] [address] [place], [place] [address]    Report Status PENDING  Incomplete  Blood culture (routine  x 2)     Status: None (Preliminary result)   Collection Time: 01/14/20  3:03 PM   Specimen: BLOOD  Result Value Ref Range Status   Specimen Description   Final    BLOOD LEFT ANTECUBITAL Performed at [facility] [address], [place], [place] [address]    Special Requests   Final    BOTTLES DRAWN AEROBIC AND ANAEROBIC Blood Culture results may not be optimal due to an inadequate volume of blood received in culture bottles Performed at [facility] [address] [place], [place] [address]    Culture   Final    NO GROWTH 2 DAYS Performed at [facility] [address], [place],  [address]    Report Status PENDING  Incomplete  SARS Coronavirus 2 by RT PCR (hospital order, performed in [facility] hospital lab) Nasopharyngeal Nasopharyngeal Swab     Status: None   Collection Time: 01/14/20  3:12 PM   Specimen: Nasopharyngeal Swab  Result Value Ref Range Status   SARS Coronavirus 2 NEGATIVE NEGATIVE Final    Comment: (NOTE) SARS-CoV-2 target nucleic acids are NOT DETECTED. The SARS-CoV-2 RNA is generally detectable in upper and lower respiratory specimens during  the acute phase of infection. The lowest concentration of SARS-CoV-2 viral copies this assay can detect is 250 copies / mL. A negative result does not preclude SARS-CoV-2 infection and should not be used as the sole basis for treatment or other patient management decisions.  A negative result may occur with improper specimen collection / handling, submission of specimen other than nasopharyngeal swab, presence of viral mutation(s) within the areas targeted by this assay, and inadequate number of viral copies (<250 copies / mL). A negative result must be combined with clinical observations, patient history, and epidemiological information. Fact Sheet for Patients:   StrictlyIdeas.no Fact Sheet for Healthcare  Providers: BankingDealers.co.za This test is not yet approved or cleared  by the [place] FDA and has been authorized for detection and/or diagnosis of SARS-CoV-2 by FDA under an Emergency Use Authorization (EUA).  This EUA will remain in effect (meaning this test can be used) for the duration of the COVID-19 declaration under Section 564(b)(1) of the Act, 21 U.S.C. section 360bbb-3(b)(1), unless the authorization is terminated or revoked sooner. Performed at [facility], [place] [address] [place], [place] [address]      Labs: Basic Metabolic Panel: Recent Labs  Lab 01/14/20 1407 01/15/20 0448 01/16/20 0431  NA 137 140 137  K 4.6 3.9 3.6  CL 101 107 105  CO2 27 25 26   GLUCOSE 109* 110* 96  BUN 15 14 11   CREATININE 0.96 0.81 0.79  CALCIUM 9.6 9.4 8.7*   Liver Function Tests: Recent Labs  Lab 01/14/20 1407 01/15/20 0448  AST 24 15  ALT 16 14  ALKPHOS 77 61  BILITOT 0.9 1.0  PROT 7.7 6.1*  ALBUMIN 4.1 3.3*   Recent Labs  Lab 01/14/20 1407  LIPASE 21   No results for input(s): AMMONIA in the last 168 hours. CBC: Recent Labs  Lab 01/14/20 1407 01/15/20 0448 01/16/20 0431  WBC 27.0* 13.9* 10.2  HGB 15.1* 12.6 12.2  HCT 48.1* 40.7 39.3  MCV 92.9 92.3 92.7  PLT 262 204 211   Cardiac Enzymes: No results for input(s): CKTOTAL, CKMB, CKMBINDEX, TROPONINI in the last 168 hours. BNP: BNP (last 3 results) Recent Labs    10/27/19 1127  BNP 451.1*    ProBNP (last 3 results) No results for input(s): PROBNP in the last 8760 hours.  CBG: No results for input(s): GLUCAP in the last 168 hours.     Signed:  Oswald Hillock MD.  [facility] 01/16/2020, 10:08 AM

## 2020-01-16 NOTE — Discharge Instructions (Signed)
Colitis  Colitis is inflammation of the colon. Colitis may last a short time (be acute), or it may last a long time (become chronic). What are the causes? This condition may be caused by:  Viruses.  Bacteria.  Reaction to medicine.  Certain autoimmune diseases such as Crohn's disease or ulcerative colitis.  Radiation treatment.  Decreased blood flow to the bowel (ischemia). What are the signs or symptoms? Symptoms of this condition include:  Watery diarrhea.  Passing bloody or tarry stool.  Pain.  Fever.  Vomiting.  Tiredness (fatigue).  Weight loss.  Bloating.  Abdominal pain.  Having fewer bowel movements than usual.  A strong and sudden urge to have a bowel movement.  Feeling like the bowel is not empty after a bowel movement. How is this diagnosed? This condition is diagnosed with a stool test or a blood test. You may also have other tests, such as:  X-rays.  CT scan.  Colonoscopy.  Endoscopy.  Biopsy. How is this treated? Treatment for this condition depends on the cause. The condition may be treated by:  Resting the bowel. This involves not eating or drinking for a period of time.  Fluids that are given through an IV.  Medicine for pain and diarrhea.  Antibiotic medicines.  Cortisone medicines.  Surgery. Follow these instructions at home: Eating and drinking   Follow instructions from your health care provider about eating or drinking restrictions.  Drink enough fluid to keep your urine pale yellow.  Work with a dietitian to determine which foods cause your condition to flare up.  Avoid foods that cause flare-ups.  Eat a well-balanced diet. General instructions  If you were prescribed an antibiotic medicine, take it as told by your health care provider. Do not stop taking the antibiotic even if you start to feel better.  Take over-the-counter and prescription medicines only as told by your health care provider.  Keep all  follow-up visits as told by your health care provider. This is important. Contact a health care provider if:  Your symptoms do not go away.  You develop new symptoms. Get help right away if you:  Have a fever that does not go away with treatment.  Develop chills.  Have extreme weakness, fainting, or dehydration.  Have repeated vomiting.  Develop severe pain in your abdomen.  Pass bloody or tarry stool. Summary  Colitis is inflammation of the colon. Colitis may last a short time (be acute), or it may last a long time (become chronic).  Treatment for this condition depends on the cause and may include resting the bowel, taking medicines, or having surgery.  If you were prescribed an antibiotic medicine, take it as told by your health care provider. Do not stop taking the antibiotic even if you start to feel better.  Get help right away if you develop severe pain in your abdomen.  Keep all follow-up visits as told by your health care provider. This is important. This information is not intended to replace advice given to you by your health care provider. Make sure you discuss any questions you have with your health care provider. Document Revised: 02/20/2018 Document Reviewed: 02/20/2018 Elsevier Patient Education  [place] Pneumonia, Adult Pneumonia is a type of lung infection that causes swelling in the airways of the lungs. Mucus and fluid may also build up inside the airways. This may cause coughing and difficulty breathing. There are different types of pneumonia. One type can develop while a person  is in a hospital. A different type is called community-acquired pneumonia. It develops in people who are not, and have not recently been, in the hospital or another type of health care facility. What are the causes? This condition may be caused by:  Viruses. This is the most common cause of pneumonia.  Bacteria. Community-acquired pneumonia is  often caused by Streptococcus pneumoniae bacteria. These bacteria are often passed from one person to another by breathing in droplets from the cough or sneeze of an infected person.  Fungi. This is the least common cause of pneumonia. What increases the risk? The following factors may make you more likely to develop this condition:  Having a chronic disease, such as chronic obstructive pulmonary disease (COPD), asthma, congestive heart failure, cystic fibrosis, diabetes, or kidney disease.  Having early-stage or late-stage HIV.  Having sickle cell disease.  Having had your spleen removed (splenectomy).  Having poor dental hygiene.  Having a medical condition that increases the risk of breathing in (aspirating) secretions from your own mouth and nose.  Having a weakened body defense system (immune system).  Being a smoker.  Traveling to areas where pneumonia-causing germs commonly exist.  Being around animal habitats or animals that have pneumonia-causing germs, including birds, bats, rabbits, cats, and farm animals. What are the signs or symptoms? Symptoms of this condition include:  A dry cough.  A wet (productive) cough.  Fever.  Sweating.  Chest pain, especially when breathing deeply or coughing.  Rapid breathing or difficulty breathing.  [name].  Shaking chills.  Fatigue.  Muscle aches. How is this diagnosed? This condition may be diagnosed based on:  Your medical history.  A physical exam. You may also have tests, including:  Chest X-rays.  Tests of your blood oxygen level and other blood gases.  Tests on blood, mucus (sputum), fluid around your lungs (pleural fluid), and urine. If your pneumonia is severe, other tests may be done to find the exact cause of your illness. How is this treated? Treatment for this condition depends on many factors, such as the cause of your pneumonia, the medicines you take, and other medical conditions  that you have. For most adults, treatment and recovery from pneumonia may occur at home. In some cases, treatment must happen in a hospital. Treatment may include:  Medicines that are given by mouth or through an IV, including: ? Antibiotic medicines, if the pneumonia was caused by bacteria. ? Antiviral medicines, if the pneumonia was caused by a virus.  Being given extra oxygen.  Respiratory therapy. Although rare, treating severe pneumonia may include:  Using a machine to help you breathe (mechanical ventilation). This is done if you are not breathing well on your own and you cannot maintain a safe blood oxygen level.  Thoracentesis. This is a procedure to remove fluid from around one lung or both lungs to help you breathe better. Follow these instructions at home:  Medicines  Take over-the-counter and prescription medicines only as told by your health care provider. ? Only take cough medicine if you are losing sleep. Be aware that cough medicine can prevent your body's natural ability to remove mucus from your lungs.  If you were prescribed an antibiotic medicine, take it as told by your health care provider. Do not stop taking the antibiotic even if you start to feel better. General instructions  Sleep in a semi-upright position at night. Try sleeping in a reclining chair, or place a few pillows under your head.  Rest as needed and get at least 8 hours of sleep each night.  Drink enough water to keep your urine pale yellow. This will help to thin out mucus secretions in your lungs.  Eat a healthy diet that includes plenty of vegetables, fruits, whole grains, low-fat dairy products, and lean protein.  Do not use any products that contain nicotine or tobacco, such as cigarettes, e-cigarettes, and chewing tobacco. If you need help quitting, ask your health care provider.  Keep all follow-up visits as told by your health care provider. This is important. How is this  prevented? You can lower your risk of developing community-acquired pneumonia by:  Getting a pneumococcal vaccine. There are different types and schedules of pneumococcal vaccines. Ask your health care provider which option is best for you. Consider getting the vaccine if: ? You are older than 78 years of age. ? You are older than 78 years of age and are undergoing cancer treatment, have chronic lung disease, or have other medical conditions that affect your immune system. Ask your health care provider if this applies to you.  Getting an influenza vaccine every year. Ask your health care provider which type of vaccine is best for you.  Getting regular checkups from your dentist.  Washing your hands often. If soap and water are not available, use hand sanitizer. Contact a health care provider if:  You have a fever.  You are losing sleep because you cannot control your cough with cough medicine. Get help right away if:  You have worsening shortness of breath.  You have increased chest pain.  Your sickness becomes worse, especially if you are an older adult or have a weakened immune system.  You cough up blood. Summary  Pneumonia is an infection of the lungs.  Community-acquired pneumonia develops in people who have not been in the hospital. It can be caused by bacteria, viruses, or fungi.  This condition may be treated with antibiotics or antiviral medicines.  Severe cases may require hospitalization, mechanical ventilation, and other procedures to drain fluid from the lungs. This information is not intended to replace advice given to you by your health care provider. Make sure you discuss any questions you have with your health care provider. Document Revised: 04/17/2018 Document Reviewed: 04/17/2018 Elsevier Patient Education  [place].

## 2020-01-16 NOTE — Progress Notes (Signed)
Patient states that she feels better and is not nauseated at this time.

## 2020-01-16 NOTE — Progress Notes (Signed)
Patient vomited small amount of what looked like grits. Patient given po zofran. MD made aware. Patient states that she still wants to be discharged.

## 2020-01-18 ENCOUNTER — Telehealth: Payer: Self-pay | Admitting: *Deleted

## 2020-01-18 ENCOUNTER — Telehealth: Payer: Self-pay | Admitting: Critical Care Medicine

## 2020-01-18 NOTE — Telephone Encounter (Signed)
Called pt top make hosp f/u. 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 She will have to get someone to bring her. Once she find a ride... she states she will call bck to schedule.Marland KitchenJohny Chess

## 2020-01-18 NOTE — Telephone Encounter (Signed)
Spoke with patient. She stated that she would like to have an order placed to [facility] to D/C her oxygen. She last used her O2 back in March 2021 when she had PNA. She has not used it since and does not feel like she needs it. She is aware that an order will need to be sent to [facility]. I advised her I would send a message to Dr. Carlis Abbott for her. She verbalized understanding.   Dr. Carlis Abbott, please advise. Thanks!

## 2020-01-19 ENCOUNTER — Telehealth: Payer: Self-pay

## 2020-01-19 LAB — CULTURE, BLOOD (ROUTINE X 2)
Culture: NO GROWTH
Culture: NO GROWTH

## 2020-01-19 NOTE — Telephone Encounter (Signed)
Patient calling and states that she has not been on oxygen for 2 months and is needing a D/C order sent to [facility] for them to come pick up her machine. States that she spoke with Pulmonary yesterday, but they are not the ones who initiated her oxygen order. Please advise.

## 2020-01-20 NOTE — Telephone Encounter (Signed)
Pt called back and made hosp f/u for 01/28/20. Called pt to complete TCM questionnaire since she wasn't transferred back to me yesterday.../lmb  Transition Care Management Follow-up Telephone Call    Date discharged? 01/16/20   How have you been since you were released from the hospital? Pt states she is doing alright   Do you understand why you were in the hospital? YES   Do you understand the discharge instructions? YES   Where were you discharged to? Home   Items Reviewed:  Medications reviewed: YES, pt states she is still taking the Flagyl she has 3 more days to take  Allergies reviewed: YES  Dietary changes reviewed: YES, heart healthy  Referrals reviewed: No referral recommeded   Functional Questionnaire:   Activities of Daily Living (ADLs):   She states she are independent in the following: bathing and hygiene, feeding, continence, grooming, toileting and dressing States she require assistance with the following: ambulation sometimes   Any transportation issues/concerns?: NO, she states her son will bring her   Any patient concerns? NO   Confirmed importance and date/time of follow-up visits scheduled YES, appt 01/28/20  Provider Appointment booked with Dr. [name]  Confirmed with patient if condition begins to worsen call PCP or go to the ER.  Patient was given the office number and encouraged to call back with question or concerns.  : YES

## 2020-01-20 NOTE — Telephone Encounter (Signed)
As previously stated last month when she also called to D/C oxygen she would need an evaluation to see if she still needs the oxygen. I reviewed pulmonary's note and it does not appear they did a walk test with her to determine if this is needed. Can schedule appointment to do that if she wants otherwise we do not know if she still needs oxygen or not.

## 2020-01-20 NOTE — Telephone Encounter (Signed)
Spoke with patient appointment is scheduled for 01-28-2020. Patient is aware of message

## 2020-01-25 NOTE — Telephone Encounter (Signed)
That is fine with me if she was not requiring it when she was discharged from the hospital earlier this month. Thank you!  LPC

## 2020-01-26 DIAGNOSIS — J449 Chronic obstructive pulmonary disease, unspecified: Secondary | ICD-10-CM | POA: Diagnosis not present

## 2020-01-26 DIAGNOSIS — J189 Pneumonia, unspecified organism: Secondary | ICD-10-CM | POA: Diagnosis not present

## 2020-01-26 NOTE — Telephone Encounter (Signed)
Spoke with patient. She stated that she now wants to keep the oxygen until her walk test with Dr. [name] on Thursday. I advised her that if she still wants to have the O2 D/C to have their office to write the order. She verbalized understanding.   Will close encounter.

## 2020-01-28 ENCOUNTER — Ambulatory Visit (INDEPENDENT_AMBULATORY_CARE_PROVIDER_SITE_OTHER): Payer: Medicare HMO | Admitting: Internal Medicine

## 2020-01-28 ENCOUNTER — Other Ambulatory Visit: Payer: Self-pay

## 2020-01-28 ENCOUNTER — Encounter: Payer: Self-pay | Admitting: Internal Medicine

## 2020-01-28 VITALS — BP 144/82 | HR 82 | Temp 98.4°F | Ht 59.0 in | Wt 120.2 lb

## 2020-01-28 DIAGNOSIS — J189 Pneumonia, unspecified organism: Secondary | ICD-10-CM

## 2020-01-28 DIAGNOSIS — R0781 Pleurodynia: Secondary | ICD-10-CM | POA: Diagnosis not present

## 2020-01-28 DIAGNOSIS — I10 Essential (primary) hypertension: Secondary | ICD-10-CM

## 2020-01-28 DIAGNOSIS — J449 Chronic obstructive pulmonary disease, unspecified: Secondary | ICD-10-CM

## 2020-01-28 MED ORDER — NYSTATIN 100000 UNIT/ML MT SUSP
5.0000 mL | Freq: Three times a day (TID) | OROMUCOSAL | 0 refills | Status: DC
Start: 2020-01-28 — End: 2020-02-11

## 2020-01-28 NOTE — Progress Notes (Signed)
   Subjective:   Patient ID: Sherry Lambert, female    DOB: 03-11-1941, 79 y.o.   MRN: [id number]  HPI The patient is a 79 YO female coming in for hospital follow up (admitted for CAP and colitis and treated with antibiotics, discharged with several days of antibiotics). She did have improvement in diarrhea and cough/breathing while in hospital. Did complete antibiotics as instructed. Wants to keep oxygen in the home as she has had a couple of flares where she felt she needed it since leaving hospital. Denies SOB mostly or cough. Her diarrhea is gone and no stomach pain. She is taking her medications as prescribed. Denies chest pains or headaches. Does have a lot of rib soreness from coughing recently and hurts off and on. Some mild swelling in her feet which is the same as before. Some white plaque in her mouth under her uppers, uses trelegy and tries to rinse mouth good after using.  PMH, [facility], social history reviewed and updated  Review of Systems  Constitutional: Negative.   HENT: Negative.        White plaque roof of mouth  Eyes: Negative.   Respiratory: Negative for cough, chest tightness and shortness of breath.   Cardiovascular: Negative for chest pain, palpitations and leg swelling.  Gastrointestinal: Negative for abdominal distention, abdominal pain, constipation, diarrhea, nausea and vomiting.  Musculoskeletal: Positive for arthralgias and myalgias.  Skin: Negative.   Neurological: Negative.   Psychiatric/Behavioral: Negative.     Objective:  Physical Exam Constitutional:      Appearance: She is well-developed.  HENT:     Head: Normocephalic and atraumatic.  Cardiovascular:     Rate and Rhythm: Normal rate and regular rhythm.  Pulmonary:     Effort: Pulmonary effort is normal. No respiratory distress.     Breath sounds: Normal breath sounds. No wheezing or rales.  Abdominal:     General: Bowel sounds are normal. There is no distension.     Palpations: Abdomen is soft.    Tenderness: There is no abdominal tenderness. There is no rebound.  Musculoskeletal:     Cervical back: Normal range of motion.  Skin:    General: Skin is warm and dry.  Neurological:     Mental Status: She is alert and oriented to person, place, and time. Mental status is at baseline.     Sensory: Sensory deficit present.     Coordination: Coordination abnormal.     Comments: Walker     Vitals:   01/28/20 1603  BP: (!) 144/82  Pulse: 82  Temp: 98.4 F (36.9 C)  SpO2: 99%  Weight: 120 lb 3.2 oz (54.5 kg)  Height: 4\' 11"  (1.499 m)    This visit occurred during the SARS-CoV-2 public health emergency.  Safety protocols were in place, including screening questions prior to the visit, additional usage of staff PPE, and extensive cleaning of exam room while observing appropriate contact time as indicated for disinfecting solutions.   Assessment & Plan:

## 2020-01-28 NOTE — Patient Instructions (Addendum)
We do not need any blood work today.   You can try the creams you have at home and let us know if they do not work.   I would do the vaseline on the lips 2-3 times a day.

## 2020-01-29 NOTE — Assessment & Plan Note (Signed)
Likely worse due to coughing recently. She has some cream she is going to try at home.

## 2020-01-29 NOTE — Assessment & Plan Note (Signed)
Clinically appears to have improved with antibiotics. Will leave oxygen in home for now given her severe breathing problems chronically.

## 2020-01-29 NOTE — Assessment & Plan Note (Signed)
BP close to goal off meds. May need to resume would recommend amlodipine or ARB if needed.

## 2020-01-29 NOTE — Assessment & Plan Note (Signed)
Appears to have some thrush per clinical description likely related to medication getting under her uppers when using trelegy. Rx nystatin liquid to swish and spit.

## 2020-02-04 DIAGNOSIS — J189 Pneumonia, unspecified organism: Secondary | ICD-10-CM | POA: Diagnosis not present

## 2020-02-04 DIAGNOSIS — J449 Chronic obstructive pulmonary disease, unspecified: Secondary | ICD-10-CM | POA: Diagnosis not present

## 2020-02-10 ENCOUNTER — Telehealth: Payer: Self-pay | Admitting: Internal Medicine

## 2020-02-10 NOTE — Telephone Encounter (Signed)
New message:    Pt is calling and states she was prescribed a antibiotic for some thrush in her mouth. She states it worked well but feels as if it is some still in there. She would like to know if something can be called in to [place], [place] to help clear up the rest. Please advise.

## 2020-02-11 MED ORDER — NYSTATIN 100000 UNIT/ML MT SUSP: 5.0000 mL | Freq: Three times a day (TID) | OROMUCOSAL | 0 refills | Status: DC

## 2020-02-11 NOTE — Telephone Encounter (Signed)
Pt has been informed.

## 2020-02-11 NOTE — Telephone Encounter (Signed)
Refilled the nystatin liquid but if this does not help she will need visit to assess.

## 2020-02-11 NOTE — Addendum Note (Signed)
Addended by: [name] A on: 02/11/2020 09:39 AM   Modules accepted: Orders

## 2020-02-24 ENCOUNTER — Ambulatory Visit: Payer: Medicare HMO | Admitting: Internal Medicine

## 2020-02-26 ENCOUNTER — Encounter: Payer: Self-pay | Admitting: Internal Medicine

## 2020-02-26 ENCOUNTER — Ambulatory Visit (INDEPENDENT_AMBULATORY_CARE_PROVIDER_SITE_OTHER): Payer: Medicare HMO | Admitting: Internal Medicine

## 2020-02-26 ENCOUNTER — Other Ambulatory Visit: Payer: Self-pay

## 2020-02-26 DIAGNOSIS — J189 Pneumonia, unspecified organism: Secondary | ICD-10-CM | POA: Diagnosis not present

## 2020-02-26 DIAGNOSIS — J449 Chronic obstructive pulmonary disease, unspecified: Secondary | ICD-10-CM | POA: Diagnosis not present

## 2020-02-26 DIAGNOSIS — B370 Candidal stomatitis: Secondary | ICD-10-CM | POA: Diagnosis not present

## 2020-02-26 MED ORDER — OMEPRAZOLE 40 MG PO CPDR
40.0000 mg | DELAYED_RELEASE_CAPSULE | Freq: Every day | ORAL | 3 refills | Status: DC
Start: 2020-02-26 — End: 2021-03-02

## 2020-02-26 MED ORDER — FLUCONAZOLE 100 MG PO TABS
100.0000 mg | ORAL_TABLET | Freq: Every day | ORAL | 0 refills | Status: AC
Start: 2020-02-26 — End: 2020-03-18

## 2020-02-26 NOTE — Assessment & Plan Note (Signed)
Concern remains for thrush as symptoms improved with treatment and she is on high risk medication. 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

## 2020-02-26 NOTE — Patient Instructions (Addendum)
We have also sent in diflucan 1 pill daily for 3 weeks.   We have refilled the omeprazole.

## 2020-02-26 NOTE — Progress Notes (Signed)
   Subjective:   Patient ID: Sherry Lambert, female    DOB: Apr 18, 1941, 79 y.o.   MRN: [id number]  HPI The patient is a 79 YO female coming in for concerns about thrush. She is having burning at the corners of her mouth and skin breakdown as well as bumps on her tongue. She does rinse mouth well after using inhaler. She did previously use nystatin and feels that this helped but wore off once she stopped taking it and symptoms returned.   Review of Systems  Constitutional: Negative.   HENT: Negative.        Tongue and lip changes  Eyes: Negative.   Respiratory: Positive for shortness of breath. Negative for cough and chest tightness.   Cardiovascular: Negative for chest pain, palpitations and leg swelling.  Gastrointestinal: Negative for abdominal distention, abdominal pain, constipation, diarrhea, nausea and vomiting.  Musculoskeletal: Negative.   Skin: Negative.   Neurological: Positive for weakness and numbness.  Psychiatric/Behavioral: Negative.     Objective:  Physical Exam Constitutional:      Appearance: She is well-developed.  HENT:     Head: Normocephalic and atraumatic.     Comments: Tongue with prominent and large likely taste buds posterior tongue. Lips with skin breakdown at the corners.  Cardiovascular:     Rate and Rhythm: Normal rate and regular rhythm.  Pulmonary:     Effort: Pulmonary effort is normal. No respiratory distress.     Breath sounds: Normal breath sounds. No wheezing or rales.  Abdominal:     General: Bowel sounds are normal. There is no distension.     Palpations: Abdomen is soft.     Tenderness: There is no abdominal tenderness. There is no rebound.  Musculoskeletal:     Cervical back: Normal range of motion.  Skin:    General: Skin is warm and dry.  Neurological:     Mental Status: She is alert and oriented to person, place, and time. Mental status is at baseline.     Coordination: Coordination abnormal.     Vitals:   02/26/20 1459  BP:  132/82  Pulse: 94  Temp: 98.3 F (36.8 C)  TempSrc: Oral  SpO2: 92%  Weight: 118 lb (53.5 kg)  Height: 4\' 11"  (1.499 m)    This visit occurred during the SARS-CoV-2 public health emergency.  Safety protocols were in place, including screening questions prior to the visit, additional usage of staff PPE, and extensive cleaning of exam room while observing appropriate contact time as indicated for disinfecting solutions.   Assessment & Plan:

## 2020-03-04 DIAGNOSIS — E119 Type 2 diabetes mellitus without complications: Secondary | ICD-10-CM | POA: Diagnosis not present

## 2020-03-04 DIAGNOSIS — H02831 Dermatochalasis of right upper eyelid: Secondary | ICD-10-CM | POA: Diagnosis not present

## 2020-03-04 DIAGNOSIS — H0102A Squamous blepharitis right eye, upper and lower eyelids: Secondary | ICD-10-CM | POA: Diagnosis not present

## 2020-03-04 DIAGNOSIS — Z961 Presence of intraocular lens: Secondary | ICD-10-CM | POA: Diagnosis not present

## 2020-03-04 DIAGNOSIS — H04123 Dry eye syndrome of bilateral lacrimal glands: Secondary | ICD-10-CM | POA: Diagnosis not present

## 2020-03-04 DIAGNOSIS — H02834 Dermatochalasis of left upper eyelid: Secondary | ICD-10-CM | POA: Diagnosis not present

## 2020-03-04 DIAGNOSIS — H26491 Other secondary cataract, right eye: Secondary | ICD-10-CM | POA: Diagnosis not present

## 2020-03-04 DIAGNOSIS — H5310 Unspecified subjective visual disturbances: Secondary | ICD-10-CM | POA: Diagnosis not present

## 2020-03-04 DIAGNOSIS — H4323 Crystalline deposits in vitreous body, bilateral: Secondary | ICD-10-CM | POA: Diagnosis not present

## 2020-03-04 DIAGNOSIS — H0102B Squamous blepharitis left eye, upper and lower eyelids: Secondary | ICD-10-CM | POA: Diagnosis not present

## 2020-03-05 DIAGNOSIS — J449 Chronic obstructive pulmonary disease, unspecified: Secondary | ICD-10-CM | POA: Diagnosis not present

## 2020-03-05 DIAGNOSIS — J189 Pneumonia, unspecified organism: Secondary | ICD-10-CM | POA: Diagnosis not present

## 2020-03-10 DIAGNOSIS — H5310 Unspecified subjective visual disturbances: Secondary | ICD-10-CM | POA: Diagnosis not present

## 2020-03-10 DIAGNOSIS — H26491 Other secondary cataract, right eye: Secondary | ICD-10-CM | POA: Diagnosis not present

## 2020-03-10 DIAGNOSIS — H4323 Crystalline deposits in vitreous body, bilateral: Secondary | ICD-10-CM | POA: Diagnosis not present

## 2020-03-27 DIAGNOSIS — J189 Pneumonia, unspecified organism: Secondary | ICD-10-CM | POA: Diagnosis not present

## 2020-03-27 DIAGNOSIS — J449 Chronic obstructive pulmonary disease, unspecified: Secondary | ICD-10-CM | POA: Diagnosis not present

## 2020-04-04 ENCOUNTER — Other Ambulatory Visit: Payer: Self-pay | Admitting: Internal Medicine

## 2020-04-05 DIAGNOSIS — J189 Pneumonia, unspecified organism: Secondary | ICD-10-CM | POA: Diagnosis not present

## 2020-04-05 DIAGNOSIS — J449 Chronic obstructive pulmonary disease, unspecified: Secondary | ICD-10-CM | POA: Diagnosis not present

## 2020-04-15 ENCOUNTER — Other Ambulatory Visit: Payer: Self-pay

## 2020-04-15 ENCOUNTER — Telehealth: Payer: Self-pay | Admitting: Internal Medicine

## 2020-04-15 ENCOUNTER — Encounter: Payer: Self-pay | Admitting: Internal Medicine

## 2020-04-15 ENCOUNTER — Telehealth (INDEPENDENT_AMBULATORY_CARE_PROVIDER_SITE_OTHER): Payer: Medicare HMO | Admitting: Internal Medicine

## 2020-04-15 DIAGNOSIS — K143 Hypertrophy of tongue papillae: Secondary | ICD-10-CM | POA: Diagnosis not present

## 2020-04-15 DIAGNOSIS — R209 Unspecified disturbances of skin sensation: Secondary | ICD-10-CM | POA: Diagnosis not present

## 2020-04-15 DIAGNOSIS — I69398 Other sequelae of cerebral infarction: Secondary | ICD-10-CM | POA: Diagnosis not present

## 2020-04-15 DIAGNOSIS — B370 Candidal stomatitis: Secondary | ICD-10-CM

## 2020-04-15 MED ORDER — FLUCONAZOLE 150 MG PO TABS
150.0000 mg | ORAL_TABLET | Freq: Every day | ORAL | 0 refills | Status: AC
Start: 2020-04-15 — End: 2020-05-06

## 2020-04-15 MED ORDER — NYSTATIN 100000 UNIT/ML MT SUSP: 5.0000 mL | Freq: Every day | OROMUCOSAL | 0 refills | Status: DC

## 2020-04-15 MED ORDER — TRAMADOL HCL 50 MG PO TABS: 50.0000 mg | ORAL_TABLET | Freq: Every day | ORAL | 0 refills | Status: DC | PRN

## 2020-04-15 NOTE — Telephone Encounter (Signed)
Pt has been informed and expressed understanding.  

## 2020-04-15 NOTE — Assessment & Plan Note (Signed)
Refill tramadol for daily prn which she does not use everyday.

## 2020-04-15 NOTE — Assessment & Plan Note (Signed)
Rx diflucan 21 day course oral and start nystatin nightly for prevention afterwards. Referral to dermatology to assess if another tongue problem could be mistaken for thrush.

## 2020-04-15 NOTE — Telephone Encounter (Signed)
New message:   Pt is calling and states she thought 3 prescriptions were supposed to be called in for her for the thrush in her mouth. Please advise.

## 2020-04-15 NOTE — Progress Notes (Signed)
Virtual Visit via Video Note  I connected with Sherry Lambert on 04/15/20 at 10:40 AM EDT by a video enabled telemedicine application and verified that I am speaking with the correct person using two identifiers.  The patient and the provider were at separate locations throughout the entire encounter. Patient location: car, Provider location: work   I discussed the limitations of evaluation and management by telemedicine and the availability of in person appointments. The patient expressed understanding and agreed to proceed. The patient and the provider were the only parties present for the visit unless noted in HPI below.  History of Present Illness: The patient is a 79 y.o. female with visit for concerns about thrush and loss of taste with bumps on back of tongue. Started some time ago and treated with diflucan back in June for 3 weeks which helped for some time. Now it has returned in the last several weeks. Fully vaccinated against covid-19 and denies sinus or breathing symptoms. Breathing is chronically not normal but same as usual lately. Has no fevers or chills. Does use steroid inhaler and rinses appropriately after using.  Observations/Objective: Appearance: normal, breathing appears stable, casual grooming, abdomen does not appear distended, throat tongue not well visualized, A and O times 3  Assessment and Plan: See problem oriented charting  Follow Up Instructions: rx diflucan, then start nystatin 5 mL nightly after stopping diflucan, refill tramadol for her chronic pain  I discussed the assessment and treatment plan with the patient. The patient was provided an opportunity to ask questions and all were answered. The patient agreed with the plan and demonstrated an understanding of the instructions.   The patient was advised to call back or seek an in-person evaluation if the symptoms worsen or if the condition fails to improve as anticipated.  [name], MD

## 2020-04-15 NOTE — Telephone Encounter (Signed)
Sent in liquid nystatin to start 5 mL nightly after she stops the diflucan (1 pill daily for 3 weeks).

## 2020-04-15 NOTE — Addendum Note (Signed)
Addended by: [name] A on: 04/15/2020 04:37 PM   Modules accepted: Orders

## 2020-04-27 DIAGNOSIS — J449 Chronic obstructive pulmonary disease, unspecified: Secondary | ICD-10-CM | POA: Diagnosis not present

## 2020-04-27 DIAGNOSIS — J189 Pneumonia, unspecified organism: Secondary | ICD-10-CM | POA: Diagnosis not present

## 2020-05-01 ENCOUNTER — Other Ambulatory Visit: Payer: Self-pay | Admitting: Internal Medicine

## 2020-05-02 ENCOUNTER — Telehealth: Payer: Self-pay | Admitting: Internal Medicine

## 2020-05-02 NOTE — Telephone Encounter (Signed)
traMADol Veatrice Bourbon) 50 MG tablet  [place], [place] Phone:  [phone]  Fax:  [phone]     Last appt: 8.13.21  Next appt: Not scheduled  **Patient does not drive. Daughter taking her to pick up scripts on Wednesday 9.1.21.  I did relay to the patient that could take up to 72 hours

## 2020-05-03 ENCOUNTER — Encounter: Payer: Self-pay | Admitting: Critical Care Medicine

## 2020-05-03 ENCOUNTER — Ambulatory Visit: Payer: Medicare HMO | Admitting: Critical Care Medicine

## 2020-05-03 ENCOUNTER — Other Ambulatory Visit: Payer: Self-pay

## 2020-05-03 VITALS — BP 150/82 | HR 87 | Temp 96.7°F | Ht 59.0 in | Wt 119.8 lb

## 2020-05-03 DIAGNOSIS — R0609 Other forms of dyspnea: Secondary | ICD-10-CM

## 2020-05-03 DIAGNOSIS — J449 Chronic obstructive pulmonary disease, unspecified: Secondary | ICD-10-CM

## 2020-05-03 DIAGNOSIS — R0600 Dyspnea, unspecified: Secondary | ICD-10-CM

## 2020-05-03 MED ORDER — TRELEGY ELLIPTA 100-62.5-25 MCG/INH IN AEPB
1.0000 | INHALATION_SPRAY | Freq: Every day | RESPIRATORY_TRACT | 0 refills | Status: DC
Start: 2020-05-03 — End: 2020-07-13

## 2020-05-03 NOTE — Progress Notes (Signed)
Synopsis: Referred in 2017 for COPD by [name], *. Formerly a patient of Dr. Murlean Iba.  Subjective:   PATIENT ID: Sherry Lambert GENDER: female DOB: 1940-11-09, MRN: [id number]  Chief Complaint  Patient presents with  . Follow-up    Patient states that she has been doing good since last visit until the last 3-4 days she has been having to use her nebulizer more. [name] with exertion. Denies cough    Sherry Lambert is a 79 year old with a history of COPD who presents for follow-up.  She is company today by her daughter.  She was hospitalized for 2 days in May 2021 for pneumonia, and was also hospitalized in March for the same.  She has been doing well until about 3 days ago when she noticed more dyspnea on exertion.  No fever, chills, or change in sputum quantity or quality.  She wonders if her shortness of breath is related to the hot humid weather.  She has been having baseline watery rhinorrhea in the morning and often clears her throat.  She is currently on treatment for oral candidiasis with fluconazole to complete a 3-week course.  She has been referred to dermatology by her PCP for evaluation of tongue lesions. She is having difficulty affording her medications due to being in the donut hole on prescription drug coverage.     OV 12/22/19: Sherry Lambert is a 79 year old woman who presents for evaluation of COPD.  She is accompanied today by her son.  She was diagnosed with COPD about 5 years ago and was previously followed by Dr. Murlean Iba.  She remains on Trelegy inhaler once daily, which she has been on prior to hospitalization.  She was hospitalized from 2/23-2/25 for an acute COPD exacerbation due to community-acquired pneumonia.  She has had 1 previous hospitalization in the past for pneumonia.  She had fallen on her right side about a week prior and had not been taking deep breaths or coughing due to severe pain.  Since her hospitalization she does not feel that her  breathing is back to baseline.  She denies cough and sputum production, but has dyspnea on exertion, especially when taking the steps and has occasional wheezing.  She can walk around room to room in her house, but has trouble walking from the waiting room to the exam room.  She has dyspnea even when walking around her house.  She tries to be active doing exercises when she sitting down due to her risk of falls.  She uses her albuterol about half the days of the week, usually 1-2 times per day.  She quit smoking 2004 after 35 years x 1 pack/day.  She has a sister with emphysema and thinks that her mother likely had it as well.  She has chronic edema for which she wears compression socks which is at baseline.  No previous cardiac history.  She is up-to-date on her vaccines.   Past Medical History:  Diagnosis Date  . 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 [name]   . Cataract   . Chronic kidney disease    kidney stone  . Colitis   . Colon cancer ([place]) 06/2017   cecum  . Diabetes mellitus without complication ([place])    no meds taken  . Emphysema of lung ([place])   . GERD (gastroesophageal reflux disease)   . Hyperlipidemia   . Hypertension   . Peripheral arterial disease ([place])   . [name]   .  Stroke [facility])    November 2016- Thanksgiving night     Family History  Problem Relation Age of Onset  . Hypertension Father   . Diabetes Father   . Cancer Father        melanoma  . Emphysema Mother   . Colon cancer Sister 7  . Cancer Sister        colon, surgery alone   . Emphysema Sister   . Early death Neg Hx   . Stroke Neg Hx   . Esophageal cancer Neg Hx   . Rectal cancer Neg Hx   . Stomach cancer Neg Hx      Past Surgical History:  Procedure Laterality Date  . ABDOMINAL HYSTERECTOMY    . BALLOON DILATION N/A 12/23/2012   Procedure: BALLOON DILATION;  Surgeon: Inda Castle, MD;  Location: [facility];  Service: Endoscopy;  Laterality: N/A;  . BALLOON  DILATION N/A 01/15/2013   Procedure: BALLOON DILATION;  Surgeon: Inda Castle, MD;  Location: [facility];  Service: Endoscopy;  Laterality: N/A;  . BALLOON DILATION N/A 07/22/2018   Procedure: BALLOON DILATION;  Surgeon: Mauri Pole, MD;  Location: [facility];  Service: Endoscopy;  Laterality: N/A;  . cataract     both eyes  . COLONOSCOPY    . COLONOSCOPY WITH PROPOFOL N/A 03/05/2017   Procedure: COLONOSCOPY WITH PROPOFOL;  Surgeon: Mauri Pole, MD;  Location: [facility];  Service: Endoscopy;  Laterality: N/A;  . DENTAL RESTORATION/EXTRACTION WITH X-RAY    . ESOPHAGOGASTRODUODENOSCOPY N/A 12/23/2012   Procedure: ESOPHAGOGASTRODUODENOSCOPY (EGD);  Surgeon: Inda Castle, MD;  Location: [facility];  Service: Endoscopy;  Laterality: N/A;  . ESOPHAGOGASTRODUODENOSCOPY N/A 01/15/2013   Procedure: ESOPHAGOGASTRODUODENOSCOPY (EGD);  Surgeon: Inda Castle, MD;  Location: [facility];  Service: Endoscopy;  Laterality: N/A;  . ESOPHAGOGASTRODUODENOSCOPY (EGD) WITH PROPOFOL N/A 07/22/2018   Procedure: ESOPHAGOGASTRODUODENOSCOPY (EGD) WITH PROPOFOL;  Surgeon: Mauri Pole, MD;  Location: [facility];  Service: Endoscopy;  Laterality: N/A;  . TONSILLECTOMY      Social History   Socioeconomic History  . Marital status: Divorced    Spouse name: Not on file  . Number of children: 1  . Years of education: Not on file  . Highest education level: Not on file  Occupational History  . Occupation: Retired    Comment: Tax inspector  Tobacco Use  . Smoking status: Former Smoker    Packs/day: 1.00    Years: 55.00    Pack years: 55.00    Types: Cigarettes    Quit date: 10/19/2002    Years since quitting: 17.5  . Smokeless tobacco: Never Used  Vaping Use  . Vaping Use: Never used  Substance and Sexual Activity  . Alcohol use: No    Alcohol/week: 0.0 standard drinks  . Drug use: No  . Sexual activity: Never    Birth control/protection: Surgical  Other Topics  Concern  . Not on file  Social History Narrative   Regular exercise-no   Caffeine Use-yes   Social Determinants of Health   Financial Resource Strain:   . Difficulty of Paying Living Expenses: Not on file  Food Insecurity:   . Worried About Charity fundraiser in the Last Year: Not on file  . Ran Out of Food in the Last Year: Not on file  Transportation Needs:   . Lack of Transportation (Medical): Not on file  . Lack of Transportation (Non-Medical): Not on file  Physical Activity:   . Days of  Exercise per Week: Not on file  . Minutes of Exercise per Session: Not on file  Stress:   . Feeling of Stress : Not on file  Social Connections:   . Frequency of Communication with Friends and Family: Not on file  . Frequency of Social Gatherings with Friends and Family: Not on file  . Attends Religious Services: Not on file  . Active Member of Clubs or Organizations: Not on file  . Attends Archivist Meetings: Not on file  . Marital Status: Not on file  Intimate Partner Violence:   . Fear of Current or Ex-Partner: Not on file  . Emotionally Abused: Not on file  . Physically Abused: Not on file  . Sexually Abused: Not on file     Allergies  Allergen Reactions  . Lipitor [Atorvastatin] Itching     Immunization History  Administered Date(s) Administered  . Fluad Quad(high Dose 65+) 06/18/2019  . Influenza, High Dose Seasonal PF 07/27/2013, 05/14/2016, 05/20/2017, 06/05/2018  . Influenza,inj,Quad PF,6+ Mos 08/03/2014, 06/21/2015  . Influenza,inj,quad, With Preservative 06/04/2019  . Influenza-Unspecified 07/04/2012  . PFIZER SARS-COV-2 Vaccination 11/23/2019, 12/08/2019  . Pneumococcal Conjugate-13 11/11/2015  . Pneumococcal-Unspecified 09/03/2010  . Td 09/03/2012    Outpatient Medications Prior to Visit  Medication Sig Dispense Refill  . acetaminophen (TYLENOL) 500 MG tablet Take 500 mg by mouth daily.    Marland Kitchen albuterol (VENTOLIN HFA) 108 (90 Base) MCG/ACT inhaler  Inhale 1-2 puffs into the lungs every 4 (four) hours as needed for wheezing or shortness of breath. 6.7 g 6  . aspirin EC 81 MG tablet Take 1 tablet (81 mg total) by mouth daily. 90 tablet 3  . Carboxymethylcellul-Glycerin (LUBRICATING EYE DROPS OP) Place 1 drop into both eyes daily as needed (dry eyes).    . Cholecalciferol (VITAMIN D3) 125 MCG (5000 UT) CAPS Take 5,000 Units by mouth daily.    . Doxylamine Succinate, Sleep, (SLEEP AID PO) Take 1 tablet by mouth at bedtime as needed (sleep).    . fluconazole (DIFLUCAN) 150 MG tablet Take 1 tablet (150 mg total) by mouth daily for 21 days. 21 tablet 0  . Fluticasone-Umeclidin-Vilant (TRELEGY ELLIPTA) 100-62.5-25 MCG/INH AEPB Inhale 1 puff into the lungs daily. 60 each 11  . gabapentin (NEURONTIN) 300 MG capsule TAKE 1 CAPSULE BY MOUTH THREE TIMES DAILY 90 capsule 0  . ipratropium-albuterol (DUONEB) 0.5-2.5 (3) MG/3ML SOLN Take 3 mLs by nebulization every 4 (four) hours as needed. (Patient taking differently: Take 3 mLs by nebulization every 4 (four) hours as needed (for wheezing or shortness of breath). ) 360 mL 6  . nystatin (MYCOSTATIN) 100000 UNIT/ML suspension Take 5 mLs (500,000 Units total) by mouth daily. 150 mL 0  . omeprazole (PRILOSEC) 40 MG capsule Take 1 capsule (40 mg total) by mouth daily. 90 capsule 3  . pravastatin (PRAVACHOL) 20 MG tablet Take 1 tablet by mouth once daily 90 tablet 0  . promethazine-dextromethorphan (PROMETHAZINE-DM) 6.25-15 MG/5ML syrup Take 5 mLs by mouth 2 (two) times daily as needed for cough. 118 mL 0  . traMADol (ULTRAM) 50 MG tablet Take 1 tablet (50 mg total) by mouth daily as needed. 30 tablet 0   No facility-administered medications prior to visit.    Review of Systems  Constitutional: Negative for chills and fever.  HENT: Negative.   Respiratory: Positive for shortness of breath and wheezing. Negative for cough and sputum production.   Cardiovascular: Positive for leg swelling. Negative for chest  pain.  Gastrointestinal: Negative for  heartburn, nausea and vomiting.  Skin: Negative for rash.  Endo/Heme/Allergies: Negative for environmental allergies.     Objective:   Vitals:   05/03/20 0920  BP: (!) 150/82  Pulse: 87  Temp: (!) 96.7 F (35.9 C)  TempSrc: Temporal  SpO2: 92%  Weight: 119 lb 12.8 oz (54.3 kg)  Height: 4\' 11"  (1.499 m)   92% on  RA BMI Readings from Last 3 Encounters:  05/03/20 24.20 kg/m  02/26/20 23.83 kg/m  01/28/20 24.28 kg/m   Wt Readings from Last 3 Encounters:  05/03/20 119 lb 12.8 oz (54.3 kg)  02/26/20 118 lb (53.5 kg)  01/28/20 120 lb 3.2 oz (54.5 kg)    Physical Exam Vitals reviewed.  Constitutional:      General: She is not in acute distress.    Appearance: She is not ill-appearing.     Comments: Frail-appearing  HENT:     Head: Normocephalic and atraumatic.     Mouth/Throat:     Comments: Mucosa moist, no pharyngeal plaques or erythema Eyes:     General: No scleral icterus. Cardiovascular:     Rate and Rhythm: Normal rate and regular rhythm.  Pulmonary:     Comments: Breathing comfortably on room air, no conversational dyspnea.  Clear to auscultation bilaterally. Musculoskeletal:        General: No deformity.     Cervical back: Neck supple.  Skin:    General: Skin is warm and dry.     Findings: No rash.  Neurological:     General: No focal deficit present.     Mental Status: She is alert.     Coordination: Coordination normal.  Psychiatric:        Mood and Affect: Mood normal.        Behavior: Behavior normal.      CBC    Component Value Date/Time   WBC 10.2 01/16/2020 0431   RBC 4.24 01/16/2020 0431   HGB 12.2 01/16/2020 0431   HGB 11.4 (L) 07/30/2017 1116   HCT 39.3 01/16/2020 0431   HCT 36.1 07/30/2017 1116   PLT 211 01/16/2020 0431   PLT 252 07/30/2017 1116   MCV 92.7 01/16/2020 0431   MCV 88.6 07/30/2017 1116   MCH 28.8 01/16/2020 0431   MCHC 31.0 01/16/2020 0431   RDW 13.6 01/16/2020 0431   RDW  14.3 07/30/2017 1116   LYMPHSABS 1.0 10/28/2019 0457   LYMPHSABS 1.5 07/30/2017 1116   MONOABS 0.5 10/28/2019 0457   MONOABS 0.5 07/30/2017 1116   EOSABS 0.0 10/28/2019 0457   EOSABS 0.1 07/30/2017 1116   BASOSABS 0.0 10/28/2019 0457   BASOSABS 0.1 07/30/2017 1116    CHEMISTRY No results for input(s): NA, K, CL, CO2, GLUCOSE, BUN, CREATININE, CALCIUM, MG, PHOS in the last 168 hours. CrCl cannot be calculated (Patient's most recent lab result is older than the maximum 21 days allowed.).   Chest Imaging- films reviewed: CTA Chest 10/27/2019-emphysema, bilateral dependent pleural effusions.  No PE, no significant mediastinal or hilar adenopathy.  Hernia with stomach and abdominal fat contents protruding into the mediastinum.  Pulmonary Functions Testing Results: PFT Results Latest Ref Rng & Units 02/13/2016  FVC-Pre L 1.82  FVC-Predicted Pre % 76  FVC-Post L 2.02  FVC-Predicted Post % 84  Pre FEV1/FVC % % 45  Post FEV1/FCV % % 47  FEV1-Pre L 0.82  FEV1-Predicted Pre % 46  FEV1-Post L 0.94  DLCO uncorrected ml/min/mmHg 9.75  DLCO UNC% % 51  DLCO corrected ml/min/mmHg 9.26  DLCO  COR %Predicted % 49  DLVA Predicted % 64   2017- severe obstruction with out significant bronchodilator reversibility.  Moderate diffusion impairment.  Flow volume loop supports obstruction.  2019 spirometry: FVC 1.8 (75%) FEV1 0.7 (39%) Ratio 38%      Assessment & Plan:     ICD-10-CM   1. Chronic obstructive pulmonary disease, unspecified COPD type ([place])  J44.9   2. DOE (dyspnea on exertion)  R06.00     COPD with recent exacerbation due to community-acquired pneumonia.  Pneumonia precipitated by suboptimal pulmonary hygiene due to splinting from rib pain. -Continue Trelegy once daily.  Sample given today.  Instructed to continue rinsing her mouth very well after every use.  She is interested in trying a mouthwash to help prevent future thrush episodes.  Encouraged her to continue controlling her  diabetes well-diet controlled. -Continue albuterol and DuoNebs every 4 hours as needed -Up-to-date on seasonal flu, pneumonia, Covid vaccines  History of tobacco abuse (quit in 2004) -Can discuss low-dose screening CT in 2022; had CT in February 2021 while hospitalized  RTC in 3 months.   Current Outpatient Medications:  .  acetaminophen (TYLENOL) 500 MG tablet, Take 500 mg by mouth daily., Disp: , Rfl:  .  albuterol (VENTOLIN HFA) 108 (90 Base) MCG/ACT inhaler, Inhale 1-2 puffs into the lungs every 4 (four) hours as needed for wheezing or shortness of breath., Disp: 6.7 g, Rfl: 6 .  aspirin EC 81 MG tablet, Take 1 tablet (81 mg total) by mouth daily., Disp: 90 tablet, Rfl: 3 .  Carboxymethylcellul-Glycerin (LUBRICATING EYE DROPS OP), Place 1 drop into both eyes daily as needed (dry eyes)., Disp: , Rfl:  .  Cholecalciferol (VITAMIN D3) 125 MCG (5000 UT) CAPS, Take 5,000 Units by mouth daily., Disp: , Rfl:  .  Doxylamine Succinate, Sleep, (SLEEP AID PO), Take 1 tablet by mouth at bedtime as needed (sleep)., Disp: , Rfl:  .  fluconazole (DIFLUCAN) 150 MG tablet, Take 1 tablet (150 mg total) by mouth daily for 21 days., Disp: 21 tablet, Rfl: 0 .  Fluticasone-Umeclidin-Vilant (TRELEGY ELLIPTA) 100-62.5-25 MCG/INH AEPB, Inhale 1 puff into the lungs daily., Disp: 60 each, Rfl: 11 .  gabapentin (NEURONTIN) 300 MG capsule, TAKE 1 CAPSULE BY MOUTH THREE TIMES DAILY, Disp: 90 capsule, Rfl: 0 .  ipratropium-albuterol (DUONEB) 0.5-2.5 (3) MG/3ML SOLN, Take 3 mLs by nebulization every 4 (four) hours as needed. (Patient taking differently: Take 3 mLs by nebulization every 4 (four) hours as needed (for wheezing or shortness of breath). ), Disp: 360 mL, Rfl: 6 .  nystatin (MYCOSTATIN) 100000 UNIT/ML suspension, Take 5 mLs (500,000 Units total) by mouth daily., Disp: 150 mL, Rfl: 0 .  omeprazole (PRILOSEC) 40 MG capsule, Take 1 capsule (40 mg total) by mouth daily., Disp: 90 capsule, Rfl: 3 .  pravastatin  (PRAVACHOL) 20 MG tablet, Take 1 tablet by mouth once daily, Disp: 90 tablet, Rfl: 0 .  promethazine-dextromethorphan (PROMETHAZINE-DM) 6.25-15 MG/5ML syrup, Take 5 mLs by mouth 2 (two) times daily as needed for cough., Disp: 118 mL, Rfl: 0 .  traMADol (ULTRAM) 50 MG tablet, Take 1 tablet (50 mg total) by mouth daily as needed., Disp: 30 tablet, Rfl: 0 .  Fluticasone-Umeclidin-Vilant (TRELEGY ELLIPTA) 100-62.5-25 MCG/INH AEPB, Inhale 1 puff into the lungs daily., Disp: 28 each, Rfl: 0    Julian Hy, DO [facility] 05/03/2020 9:45 AM

## 2020-05-03 NOTE — Patient Instructions (Addendum)
Thank you for visiting Dr. Carlis Abbott at [facility]. We recommend the following:  Continue taking Trelegy once daily and duonebs as needed. You can use your oxygen as needed, but nebulizers probably will help more.  Return in about 3 months (around 08/02/2020).    Please do your part to reduce the spread of COVID-19.

## 2020-05-06 DIAGNOSIS — J189 Pneumonia, unspecified organism: Secondary | ICD-10-CM | POA: Diagnosis not present

## 2020-05-06 DIAGNOSIS — J449 Chronic obstructive pulmonary disease, unspecified: Secondary | ICD-10-CM | POA: Diagnosis not present

## 2020-05-11 DIAGNOSIS — M1711 Unilateral primary osteoarthritis, right knee: Secondary | ICD-10-CM | POA: Diagnosis not present

## 2020-05-20 ENCOUNTER — Telehealth: Payer: Self-pay | Admitting: Internal Medicine

## 2020-05-20 NOTE — Telephone Encounter (Signed)
Patient called and was wondering if she could get a refill on traMADol (ULTRAM) 50 MG tablet   [place], [place] - [address]

## 2020-05-21 MED ORDER — TRAMADOL HCL 50 MG PO TABS: 50.0000 mg | ORAL_TABLET | Freq: Every day | ORAL | 0 refills | Status: DC | PRN

## 2020-05-21 NOTE — Telephone Encounter (Signed)
Done erx 

## 2020-05-28 DIAGNOSIS — J449 Chronic obstructive pulmonary disease, unspecified: Secondary | ICD-10-CM | POA: Diagnosis not present

## 2020-05-28 DIAGNOSIS — J189 Pneumonia, unspecified organism: Secondary | ICD-10-CM | POA: Diagnosis not present

## 2020-06-05 DIAGNOSIS — J189 Pneumonia, unspecified organism: Secondary | ICD-10-CM | POA: Diagnosis not present

## 2020-06-05 DIAGNOSIS — J449 Chronic obstructive pulmonary disease, unspecified: Secondary | ICD-10-CM | POA: Diagnosis not present

## 2020-06-06 ENCOUNTER — Telehealth: Payer: Self-pay | Admitting: Internal Medicine

## 2020-06-06 NOTE — Telephone Encounter (Signed)
Please to make rov

## 2020-06-06 NOTE — Telephone Encounter (Signed)
gabapentin (NEURONTIN) 300 MG capsule  [place] [address] [facility], [place] Phone:  [phone]  Fax:  [phone]     Patient states medicine is not working, requesting the dosage be increased

## 2020-06-07 NOTE — Telephone Encounter (Signed)
Pt has been schedule to come into the office 06/14/2020 at 10:20 am.

## 2020-06-08 ENCOUNTER — Telehealth: Payer: Self-pay | Admitting: Internal Medicine

## 2020-06-08 MED ORDER — GABAPENTIN 300 MG PO CAPS: 300.0000 mg | ORAL_CAPSULE | Freq: Three times a day (TID) | ORAL | 0 refills | Status: DC

## 2020-06-08 NOTE — Telephone Encounter (Signed)
Patient called and was requesting a medication refill for gabapentin (NEURONTIN) 300 MG capsule  It can be sent to [facility], [place] 06/14/20

## 2020-06-14 ENCOUNTER — Ambulatory Visit: Payer: Medicare HMO | Admitting: Internal Medicine

## 2020-06-14 DIAGNOSIS — K130 Diseases of lips: Secondary | ICD-10-CM | POA: Diagnosis not present

## 2020-06-15 DIAGNOSIS — H0102A Squamous blepharitis right eye, upper and lower eyelids: Secondary | ICD-10-CM | POA: Diagnosis not present

## 2020-06-15 DIAGNOSIS — H04123 Dry eye syndrome of bilateral lacrimal glands: Secondary | ICD-10-CM | POA: Diagnosis not present

## 2020-06-15 DIAGNOSIS — Z961 Presence of intraocular lens: Secondary | ICD-10-CM | POA: Diagnosis not present

## 2020-06-15 DIAGNOSIS — H0102B Squamous blepharitis left eye, upper and lower eyelids: Secondary | ICD-10-CM | POA: Diagnosis not present

## 2020-06-15 DIAGNOSIS — H02834 Dermatochalasis of left upper eyelid: Secondary | ICD-10-CM | POA: Diagnosis not present

## 2020-06-15 DIAGNOSIS — H43392 Other vitreous opacities, left eye: Secondary | ICD-10-CM | POA: Diagnosis not present

## 2020-06-15 DIAGNOSIS — H4323 Crystalline deposits in vitreous body, bilateral: Secondary | ICD-10-CM | POA: Diagnosis not present

## 2020-06-15 DIAGNOSIS — E119 Type 2 diabetes mellitus without complications: Secondary | ICD-10-CM | POA: Diagnosis not present

## 2020-06-15 DIAGNOSIS — H02831 Dermatochalasis of right upper eyelid: Secondary | ICD-10-CM | POA: Diagnosis not present

## 2020-06-16 ENCOUNTER — Other Ambulatory Visit: Payer: Self-pay | Admitting: Internal Medicine

## 2020-06-20 ENCOUNTER — Telehealth: Payer: Self-pay | Admitting: Internal Medicine

## 2020-06-20 MED ORDER — GABAPENTIN 300 MG PO CAPS: 300.0000 mg | ORAL_CAPSULE | Freq: Three times a day (TID) | ORAL | 0 refills | Status: DC

## 2020-06-20 NOTE — Telephone Encounter (Signed)
Sent to Dr. John to advise. 

## 2020-06-20 NOTE — Telephone Encounter (Signed)
gabapentin (NEURONTIN) 300 MG capsule Patient requesting a refill until her next visit Next apt- 06/22/20 Last- 04/15/20 [place], [place] Phone:  [phone]  Fax:  [phone]

## 2020-06-20 NOTE — Telephone Encounter (Signed)
1 mo done erx

## 2020-06-20 NOTE — Telephone Encounter (Signed)
See below

## 2020-06-22 ENCOUNTER — Ambulatory Visit: Payer: Medicare HMO | Admitting: Internal Medicine

## 2020-06-23 ENCOUNTER — Other Ambulatory Visit: Payer: Self-pay

## 2020-06-23 ENCOUNTER — Ambulatory Visit (INDEPENDENT_AMBULATORY_CARE_PROVIDER_SITE_OTHER): Payer: Medicare HMO

## 2020-06-23 DIAGNOSIS — Z0000 Encounter for general adult medical examination without abnormal findings: Secondary | ICD-10-CM | POA: Diagnosis not present

## 2020-06-23 NOTE — Progress Notes (Addendum)
I connected with Sherry Lambert today by telephone and verified that I am speaking with the correct person using two identifiers. Location patient: home Location provider: work Persons participating in the virtual visit: Adonai Helzer and Lisette Abu, LPN   I discussed the limitations, risks, security and privacy concerns of performing an evaluation and management service by telephone and the availability of in person appointments. I also discussed with the patient that there may be a patient responsible charge related to this service. The patient expressed understanding and verbally consented to this telephonic visit.    Interactive audio and video telecommunications were attempted between this provider and patient, however failed, due to patient having technical difficulties OR patient did not have access to video capability.  We continued and completed visit with audio only.  Some vital signs may be absent or patient reported.   Time Spent with patient on telephone encounter: 25 minutes  Subjective:   Sherry Lambert is a 79 y.o. female who presents for Medicare Annual (Subsequent) preventive examination.  Review of Systems    NO ROS. Medicare Wellness Visit Cardiac Risk Factors include: advanced age (>30men, >78 women);diabetes mellitus;dyslipidemia;hypertension;family history of premature cardiovascular disease     Objective:    There were no vitals filed for this visit. There is no height or weight on file to calculate BMI.  Advanced Directives 06/23/2020 01/14/2020 01/14/2020 10/27/2019 10/27/2019 10/17/2019 07/22/2018  Does Patient Have a Medical Advance Directive? No No No No No No No  Would patient like information on creating a medical advance directive? No - Patient declined No - Patient declined Yes (ED - Information included in AVS) No - Patient declined - No - Patient declined Yes (MAU/Ambulatory/Procedural Areas - Information given)    Current Medications  (verified) Outpatient Encounter Medications as of 06/23/2020  Medication Sig   acetaminophen (TYLENOL) 500 MG tablet Take 500 mg by mouth daily.   albuterol (VENTOLIN HFA) 108 (90 Base) MCG/ACT inhaler Inhale 1-2 puffs into the lungs every 4 (four) hours as needed for wheezing or shortness of breath.   aspirin EC 81 MG tablet Take 1 tablet (81 mg total) by mouth daily.   Carboxymethylcellul-Glycerin (LUBRICATING EYE DROPS OP) Place 1 drop into both eyes daily as needed (dry eyes).   Cholecalciferol (VITAMIN D3) 125 MCG (5000 UT) CAPS Take 5,000 Units by mouth daily.   Doxylamine Succinate, Sleep, (SLEEP AID PO) Take 1 tablet by mouth at bedtime as needed (sleep).   Fluticasone-Umeclidin-Vilant (TRELEGY ELLIPTA) 100-62.5-25 MCG/INH AEPB Inhale 1 puff into the lungs daily.   Fluticasone-Umeclidin-Vilant (TRELEGY ELLIPTA) 100-62.5-25 MCG/INH AEPB Inhale 1 puff into the lungs daily.   gabapentin (NEURONTIN) 300 MG capsule Take 1 capsule (300 mg total) by mouth 3 (three) times daily.   ipratropium-albuterol (DUONEB) 0.5-2.5 (3) MG/3ML SOLN Take 3 mLs by nebulization every 4 (four) hours as needed. (Patient taking differently: Take 3 mLs by nebulization every 4 (four) hours as needed (for wheezing or shortness of breath). )   nystatin (MYCOSTATIN) 100000 UNIT/ML suspension Take 5 mLs (500,000 Units total) by mouth daily.   omeprazole (PRILOSEC) 40 MG capsule Take 1 capsule (40 mg total) by mouth daily.   pravastatin (PRAVACHOL) 20 MG tablet Take 1 tablet by mouth once daily   promethazine-dextromethorphan (PROMETHAZINE-DM) 6.25-15 MG/5ML syrup Take 5 mLs by mouth 2 (two) times daily as needed for cough.   traMADol (ULTRAM) 50 MG tablet Take 1 tablet (50 mg total) by mouth daily as needed.   No facility-administered  encounter medications on file as of 06/23/2020.    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   [name]    Cataract    Chronic kidney disease    kidney stone   Colitis    Colon cancer ([place]) 06/2017   cecum   Diabetes mellitus without complication (HCC)    no meds taken   Emphysema of lung (HCC)    GERD (gastroesophageal reflux disease)    Hyperlipidemia    Hypertension    Peripheral arterial disease ([place])    [name]    Stroke [facility])    November 2016- Thanksgiving night   Past Surgical History:  Procedure Laterality Date   ABDOMINAL HYSTERECTOMY     BALLOON DILATION N/A 12/23/2012   Procedure: BALLOON DILATION;  Surgeon: Inda Castle, MD;  Location: [facility] END[facility];  Service: Endoscopy;  Laterality: N/A;   BALLOON DILATION N/A 01/15/2013   Procedure: BALLOON DILATION;  Surgeon: Inda Castle, MD;  Location: [facility];  Service: Endoscopy;  Laterality: N/A;   BALLOON DILATION N/A 07/22/2018   Procedure: BALLOON DILATION;  Surgeon: Mauri Pole, MD;  Location: [facility];  Service: Endoscopy;  Laterality: N/A;   cataract     both eyes   COLONOSCOPY     COLONOSCOPY WITH PROPOFOL N/A 03/05/2017   Procedure: COLONOSCOPY WITH PROPOFOL;  Surgeon: Mauri Pole, MD;  Location: [facility];  Service: Endoscopy;  Laterality: N/A;   DENTAL RESTORATION/EXTRACTION WITH X-RAY     ESOPHAGOGASTRODUODENOSCOPY N/A 12/23/2012   Procedure: ESOPHAGOGASTRODUODENOSCOPY (EGD);  Surgeon: Inda Castle, MD;  Location: [facility];  Service: Endoscopy;  Laterality: N/A;   ESOPHAGOGASTRODUODENOSCOPY N/A 01/15/2013   Procedure: ESOPHAGOGASTRODUODENOSCOPY (EGD);  Surgeon: Inda Castle, MD;  Location: [facility];  Service: Endoscopy;  Laterality: N/A;   ESOPHAGOGASTRODUODENOSCOPY (EGD) WITH PROPOFOL N/A 07/22/2018   Procedure: ESOPHAGOGASTRODUODENOSCOPY (EGD) WITH PROPOFOL;  Surgeon: Mauri Pole, MD;  Location: [facility];  Service: Endoscopy;  Laterality: N/A;   TONSILLECTOMY     Family History  Problem Relation Age of Onset    Hypertension Father    Diabetes Father    Cancer Father        melanoma   Emphysema Mother    Colon cancer Sister 13   Cancer Sister        colon, surgery alone    Emphysema Sister    Early death Neg Hx    Stroke Neg Hx    Esophageal cancer Neg Hx    Rectal cancer Neg Hx    Stomach cancer Neg Hx    Social History   Socioeconomic History   Marital status: Divorced    Spouse name: Not on file   Number of children: 1   Years of education: Not on file   Highest education level: Not on file  Occupational History   Occupation: Retired    Comment: hosier [employer]  Tobacco Use   Smoking status: Former Smoker    Packs/day: 1.00    Years: 55.00    Pack years: 55.00    Types: Cigarettes    Quit date: 10/19/2002    Years since quitting: 17.6   Smokeless tobacco: Never Used  Vaping Use   Vaping Use: Never used  Substance and Sexual Activity   Alcohol use: No    Alcohol/week: 0.0 standard drinks   Drug use: No   Sexual activity: Never  Birth control/protection: Surgical  Other Topics Concern   Not on file  Social History Narrative   Regular exercise-no   Caffeine Use-yes   Social Determinants of Health   Financial Resource Strain: Low Risk    Difficulty of Paying Living Expenses: Not hard at all  Food Insecurity: No Food Insecurity   Worried About Charity fundraiser in the Last Year: Never true   Arboriculturist in the Last Year: Never true  Transportation Needs: No Transportation Needs   Lack of Transportation (Medical): No   Lack of Transportation (Non-Medical): No  Physical Activity: Insufficiently Active   Days of Exercise per Week: 3 days   Minutes of Exercise per Session: 30 min  Stress: No Stress Concern Present   Feeling of Stress : Not at all  Social Connections: Socially Isolated   Frequency of Communication with Friends and Family: More than three times a week   Frequency of Social Gatherings with Friends and Family: More than three times a week   Attends  Religious Services: Never   Marine scientist or Organizations: No   Attends Archivist Meetings: Never   Marital Status: Widowed    Tobacco Counseling Counseling given: Not Answered   Clinical Intake:  Pre-visit preparation completed: Yes  Pain : No/denies pain     Nutritional Risks: None Diabetes: Yes CBG done?: No Did pt. bring in CBG monitor from home?: No  How often do you need to have someone help you when you read instructions, pamphlets, or other written materials from your doctor or pharmacy?: 1 - Never What is the last grade level you completed in school?: HSG  Diabetic? yes  Interpreter Needed?: No  Information entered by :: Lisette Abu, LPN   Activities of Daily Living In your present state of health, do you have any difficulty performing the following activities: 06/23/2020 01/14/2020  Hearing? N Y  Vision? N N  Difficulty concentrating or making decisions? N Y  Walking or climbing stairs? N Y  Comment - -  Dressing or bathing? N N  Doing errands, shopping? N Y  Conservation officer, nature and eating ? N -  Using the Toilet? N -  In the past six months, have you accidently leaked urine? Y -  Do you have problems with loss of bowel control? N -  Managing your Medications? N -  Managing your Finances? N -  Housekeeping or managing your Housekeeping? N -  Some recent data might be hidden    Patient Care Team: [name], MD as PCP - General (Internal Medicine)  Indicate any recent Medical Services you may have received from other than [facility] providers in the past year (date may be approximate).     Assessment:   This is a routine wellness examination for Sherry Lambert.  Hearing/Vision screen No exam data present  Dietary issues and exercise activities discussed: Current Exercise Habits: The patient does not participate in regular exercise at present, Exercise limited by: None identified  Goals   None    Depression Screen PHQ  2/9 Scores 06/23/2020 11/05/2019 09/15/2018 12/03/2016 07/04/2015 11/12/2013 11/04/2012  PHQ - 2 Score 0 0 0 0 0 0 0    Fall Risk Fall Risk  06/23/2020 11/05/2019 09/15/2018 03/18/2018 12/03/2016  Falls in the past year? 1 1 0 Yes No  Number falls in past yr: 1 0 - 1 -  Injury with Fall? 1 1 - Yes -  Risk for fall due to :  Impaired balance/gait - - - -  Follow up Falls evaluation completed - - Education provided -    Any stairs in or around the home? No  If so, are there any without handrails? No  Home free of loose throw rugs in walkways, pet beds, electrical cords, etc? Yes  Adequate lighting in your home to reduce risk of falls? Yes   ASSISTIVE DEVICES UTILIZED TO PREVENT FALLS:  Life alert? No  Use of a cane, walker or w/c? Yes  Grab bars in the bathroom? Yes  Shower chair or bench in shower? Yes  Elevated toilet seat or a handicapped toilet? Yes   TIMED UP AND GO:  Was the test performed? No .  Length of time to ambulate 10 feet: 0 sec.   Gait slow and steady with assistive device  Cognitive Function: not indicated.        Immunizations Immunization History  Administered Date(s) Administered   Fluad Quad(high Dose 65+) 06/18/2019   Influenza, High Dose Seasonal PF 07/27/2013, 05/14/2016, 05/20/2017, 06/05/2018   Influenza,inj,Quad PF,6+ Mos 08/03/2014, 06/21/2015   Influenza,inj,quad, With Preservative 06/04/2019   Influenza-Unspecified 07/04/2012   PFIZER SARS-COV-2 Vaccination 11/23/2019, 12/08/2019   Pneumococcal Conjugate-13 11/11/2015   Pneumococcal-Unspecified 09/03/2010   Td 09/03/2012    TDAP status: Up to date Flu Vaccine status: Up to date Pneumococcal vaccine status: Up to date Covid-19 vaccine status: Completed vaccines  Qualifies for Shingles Vaccine? Yes   Zostavax completed No   Shingrix Completed?: No.    Education has been provided regarding the importance of this vaccine. Patient has been advised to call insurance company to determine out of pocket  expense if they have not yet received this vaccine. Advised may also receive vaccine at local pharmacy or Health Dept. Verbalized acceptance and understanding.  Screening Tests Health Maintenance  Topic Date Due   Hepatitis C Screening  Never done   DEXA SCAN  Never done   OPHTHALMOLOGY EXAM  09/17/2016   INFLUENZA VACCINE  04/03/2020   HEMOGLOBIN A1C  04/25/2020   FOOT EXAM  06/17/2020   URINE MICROALBUMIN  06/17/2020   TETANUS/TDAP  09/03/2022   COVID-19 Vaccine  Completed   PNA vac Low Risk Adult  Completed    Health Maintenance  Health Maintenance Due  Topic Date Due   Hepatitis C Screening  Never done   DEXA SCAN  Never done   OPHTHALMOLOGY EXAM  09/17/2016   INFLUENZA VACCINE  04/03/2020   HEMOGLOBIN A1C  04/25/2020   FOOT EXAM  06/17/2020   URINE MICROALBUMIN  06/17/2020    Colorectal cancer screening: No longer required.  Mammogram status: No longer required.   Lung Cancer Screening: (Low Dose CT Chest recommended if Age 61-80 years, 30 pack-year currently smoking OR have quit w/in 15years.) does not qualify.   Lung Cancer Screening Referral: no  Additional Screening:  Hepatitis C Screening: does not qualify; Completed no  Vision Screening: Recommended annual ophthalmology exams for early detection of glaucoma and other disorders of the eye. Is the patient up to date with their annual eye exam?  Yes  Who is the provider or what is the name of the office in which the patient attends annual eye exams? Clent Jacks, MD If pt is not established with a provider, would they like to be referred to a provider to establish care? No .   Dental Screening: Recommended annual dental exams for proper oral hygiene  Community Resource Referral / Chronic Care Management: CRR required this visit?  No   CCM required this visit?  No      Plan:     I have personally reviewed and noted the following in the patient's chart:   Medical and social history Use of alcohol,  tobacco or illicit drugs  Current medications and supplements Functional ability and status Nutritional status Physical activity Advanced directives List of other physicians Hospitalizations, surgeries, and ER visits in previous 12 months Vitals Screenings to include cognitive, depression, and falls Referrals and appointments  In addition, I have reviewed and discussed with patient certain preventive protocols, quality metrics, and best practice recommendations. A written personalized care plan for preventive services as well as general preventive health recommendations were provided to patient.     Sheral Flow, LPN   16/03/3709   Nurse Notes:  Patient is cogitatively intact. There were no vitals filed for this visit. There is no height or weight on file to calculate BMI.  Medical screening examination/treatment/procedure(s) were performed by non-physician practitioner and as supervising physician I was immediately available for consultation/collaboration.  I agree with above. Lew Dawes, MD

## 2020-06-23 NOTE — Patient Instructions (Signed)
Sherry Lambert , Thank you for taking time to come for your Medicare Wellness Visit. I appreciate your ongoing commitment to your health goals. Please review the following plan we discussed and let me know if I can assist you in the future.   Screening recommendations/referrals: Colonoscopy: 05/28/2017 Mammogram: 11/20/2013 Bone Density: never done Recommended yearly ophthalmology/optometry visit for glaucoma screening and checkup Recommended yearly dental visit for hygiene and checkup  Vaccinations: Influenza vaccine: 06/18/2019 Pneumococcal vaccine: up to date Tdap vaccine: 09/03/2012 Shingles vaccine: never done   Covid-19: up to date  Advanced directives: Advance directive discussed with you today. Even though you declined this today please call our office should you change your mind and we can give you the proper paperwork for you to fill out.  Conditions/risks identified: Yes. Reviewed health maintenance screenings with patient today and relevant education, vaccines, and/or referrals were provided. Continue doing brain stimulating activities (puzzles, reading, adult coloring books, staying active) to keep memory sharp. Continue to eat heart healthy diet (full of fruits, vegetables, whole grains, lean protein, water--limit salt, fat, and sugar intake) and increase physical activity as tolerated.  Next appointment: Please schedule your next Medicare Wellness Visit with your Nurse Health Advisor in 1 year by calling [phone].   Preventive Care 53 Years and Older, Female Preventive care refers to lifestyle choices and visits with your health care provider that can promote health and wellness. What does preventive care include?  A yearly physical exam. This is also called an annual well check.  Dental exams once or twice a year.  Routine eye exams. Ask your health care provider how often you should have your eyes checked.  Personal lifestyle choices, including:  Daily care of your  teeth and gums.  Regular physical activity.  Eating a healthy diet.  Avoiding tobacco and drug use.  Limiting alcohol use.  Practicing safe sex.  Taking low-dose aspirin every day.  Taking vitamin and mineral supplements as recommended by your health care provider. What happens during an annual well check? The services and screenings done by your health care provider during your annual well check will depend on your age, overall health, lifestyle risk factors, and family history of disease. Counseling  Your health care provider may ask you questions about your:  Alcohol use.  Tobacco use.  Drug use.  Emotional well-being.  Home and relationship well-being.  Sexual activity.  Eating habits.  History of falls.  Memory and ability to understand (cognition).  Work and work Statistician.  Reproductive health. Screening  You may have the following tests or measurements:  Height, weight, and BMI.  Blood pressure.  Lipid and cholesterol levels. These may be checked every 5 years, or more frequently if you are over 25 years old.  Skin check.  Lung cancer screening. You may have this screening every year starting at age 65 if you have a 30-pack-year history of smoking and currently smoke or have quit within the past 15 years.  Fecal occult blood test (FOBT) of the stool. You may have this test every year starting at age 30.  Flexible sigmoidoscopy or colonoscopy. You may have a sigmoidoscopy every 5 years or a colonoscopy every 10 years starting at age 85.  Hepatitis C blood test.  Hepatitis B blood test.  Sexually transmitted disease (STD) testing.  Diabetes screening. This is done by checking your blood sugar (glucose) after you have not eaten for a while (fasting). You may have this done every 1-3 years.  Bone density  scan. This is done to screen for osteoporosis. You may have this done starting at age 6.  Mammogram. This may be done every 1-2 years. Talk  to your health care provider about how often you should have regular mammograms. Talk with your health care provider about your test results, treatment options, and if necessary, the need for more tests. Vaccines  Your health care provider may recommend certain vaccines, such as:  Influenza vaccine. This is recommended every year.  Tetanus, diphtheria, and acellular pertussis (Tdap, Td) vaccine. You may need a Td booster every 10 years.  Zoster vaccine. You may need this after age 61.  Pneumococcal 13-valent conjugate (PCV13) vaccine. One dose is recommended after age 4.  Pneumococcal polysaccharide (PPSV23) vaccine. One dose is recommended after age 64. Talk to your health care provider about which screenings and vaccines you need and how often you need them. This information is not intended to replace advice given to you by your health care provider. Make sure you discuss any questions you have with your health care provider. Document Released: 09/16/2015 Document Revised: 05/09/2016 Document Reviewed: 06/21/2015 Elsevier Interactive Patient Education  2017 [place] Prevention in the Home Falls can cause injuries. They can happen to people of all ages. There are many things you can do to make your home safe and to help prevent falls. What can I do on the outside of my home?  Regularly fix the edges of walkways and driveways and fix any cracks.  Remove anything that might make you trip as you walk through a door, such as a raised step or threshold.  Trim any bushes or trees on the path to your home.  Use bright outdoor lighting.  Clear any walking paths of anything that might make someone trip, such as rocks or tools.  Regularly check to see if handrails are loose or broken. Make sure that both sides of any steps have handrails.  Any raised decks and porches should have guardrails on the edges.  Have any leaves, snow, or ice cleared regularly.  Use sand or salt on  walking paths during winter.  Clean up any spills in your garage right away. This includes oil or grease spills. What can I do in the bathroom?  Use night lights.  Install grab bars by the toilet and in the tub and shower. Do not use towel bars as grab bars.  Use non-skid mats or decals in the tub or shower.  If you need to sit down in the shower, use a plastic, non-slip stool.  Keep the floor dry. Clean up any water that spills on the floor as soon as it happens.  Remove soap buildup in the tub or shower regularly.  Attach bath mats securely with double-sided non-slip rug tape.  Do not have throw rugs and other things on the floor that can make you trip. What can I do in the bedroom?  Use night lights.  Make sure that you have a light by your bed that is easy to reach.  Do not use any sheets or blankets that are too big for your bed. They should not hang down onto the floor.  Have a firm chair that has side arms. You can use this for support while you get dressed.  Do not have throw rugs and other things on the floor that can make you trip. What can I do in the kitchen?  Clean up any spills right away.  Avoid walking on wet  floors.  Keep items that you use a lot in easy-to-reach places.  If you need to reach something above you, use a strong step stool that has a grab bar.  Keep electrical cords out of the way.  Do not use floor polish or wax that makes floors slippery. If you must use wax, use non-skid floor wax.  Do not have throw rugs and other things on the floor that can make you trip. What can I do with my stairs?  Do not leave any items on the stairs.  Make sure that there are handrails on both sides of the stairs and use them. Fix handrails that are broken or loose. Make sure that handrails are as long as the stairways.  Check any carpeting to make sure that it is firmly attached to the stairs. Fix any carpet that is loose or worn.  Avoid having throw  rugs at the top or bottom of the stairs. If you do have throw rugs, attach them to the floor with carpet tape.  Make sure that you have a light switch at the top of the stairs and the bottom of the stairs. If you do not have them, ask someone to add them for you. What else can I do to help prevent falls?  Wear shoes that:  Do not have high heels.  Have rubber bottoms.  Are comfortable and fit you well.  Are closed at the toe. Do not wear sandals.  If you use a stepladder:  Make sure that it is fully opened. Do not climb a closed stepladder.  Make sure that both sides of the stepladder are locked into place.  Ask someone to hold it for you, if possible.  Clearly mark and make sure that you can see:  Any grab bars or handrails.  First and last steps.  Where the edge of each step is.  Use tools that help you move around (mobility aids) if they are needed. These include:  Canes.  Walkers.  Scooters.  Crutches.  Turn on the lights when you go into a dark area. Replace any light bulbs as soon as they burn out.  Set up your furniture so you have a clear path. Avoid moving your furniture around.  If any of your floors are uneven, fix them.  If there are any pets around you, be aware of where they are.  Review your medicines with your doctor. Some medicines can make you feel dizzy. This can increase your chance of falling. Ask your doctor what other things that you can do to help prevent falls. This information is not intended to replace advice given to you by your health care provider. Make sure you discuss any questions you have with your health care provider. Document Released: 06/16/2009 Document Revised: 01/26/2016 Document Reviewed: 09/24/2014 Elsevier Interactive Patient Education  2017 [employer].

## 2020-06-27 DIAGNOSIS — J189 Pneumonia, unspecified organism: Secondary | ICD-10-CM | POA: Diagnosis not present

## 2020-06-27 DIAGNOSIS — J449 Chronic obstructive pulmonary disease, unspecified: Secondary | ICD-10-CM | POA: Diagnosis not present

## 2020-07-05 DIAGNOSIS — B370 Candidal stomatitis: Secondary | ICD-10-CM | POA: Diagnosis not present

## 2020-07-06 DIAGNOSIS — J449 Chronic obstructive pulmonary disease, unspecified: Secondary | ICD-10-CM | POA: Diagnosis not present

## 2020-07-06 DIAGNOSIS — J189 Pneumonia, unspecified organism: Secondary | ICD-10-CM | POA: Diagnosis not present

## 2020-07-13 ENCOUNTER — Other Ambulatory Visit: Payer: Self-pay

## 2020-07-13 ENCOUNTER — Ambulatory Visit (INDEPENDENT_AMBULATORY_CARE_PROVIDER_SITE_OTHER): Payer: Medicare HMO | Admitting: Internal Medicine

## 2020-07-13 ENCOUNTER — Encounter: Payer: Self-pay | Admitting: Internal Medicine

## 2020-07-13 VITALS — BP 180/80 | HR 103 | Temp 97.7°F | Ht 59.0 in | Wt 119.0 lb

## 2020-07-13 DIAGNOSIS — E118 Type 2 diabetes mellitus with unspecified complications: Secondary | ICD-10-CM | POA: Diagnosis not present

## 2020-07-13 DIAGNOSIS — Z23 Encounter for immunization: Secondary | ICD-10-CM | POA: Diagnosis not present

## 2020-07-13 DIAGNOSIS — I739 Peripheral vascular disease, unspecified: Secondary | ICD-10-CM | POA: Diagnosis not present

## 2020-07-13 DIAGNOSIS — I69398 Other sequelae of cerebral infarction: Secondary | ICD-10-CM

## 2020-07-13 DIAGNOSIS — R209 Unspecified disturbances of skin sensation: Secondary | ICD-10-CM

## 2020-07-13 DIAGNOSIS — I10 Essential (primary) hypertension: Secondary | ICD-10-CM | POA: Diagnosis not present

## 2020-07-13 DIAGNOSIS — E1169 Type 2 diabetes mellitus with other specified complication: Secondary | ICD-10-CM | POA: Diagnosis not present

## 2020-07-13 DIAGNOSIS — E785 Hyperlipidemia, unspecified: Secondary | ICD-10-CM

## 2020-07-13 DIAGNOSIS — J449 Chronic obstructive pulmonary disease, unspecified: Secondary | ICD-10-CM

## 2020-07-13 DIAGNOSIS — Z0000 Encounter for general adult medical examination without abnormal findings: Secondary | ICD-10-CM | POA: Diagnosis not present

## 2020-07-13 LAB — COMPREHENSIVE METABOLIC PANEL
ALT: 15 U/L (ref 0–35)
AST: 19 U/L (ref 0–37)
Albumin: 4.1 g/dL (ref 3.5–5.2)
Alkaline Phosphatase: 68 U/L (ref 39–117)
BUN: 11 mg/dL (ref 6–23)
CO2: 30 mEq/L (ref 19–32)
Calcium: 9.8 mg/dL (ref 8.4–10.5)
Chloride: 105 mEq/L (ref 96–112)
Creatinine, Ser: 0.87 mg/dL (ref 0.40–1.20)
GFR: 63.96 mL/min (ref >60.00)
Glucose, Bld: 108 mg/dL — ABNORMAL HIGH (ref 70–99)
Potassium: 4.9 mEq/L (ref 3.5–5.1)
Sodium: 141 mEq/L (ref 135–145)
Total Bilirubin: 0.3 mg/dL (ref 0.2–1.2)
Total Protein: 6.9 g/dL (ref 6.0–8.3)

## 2020-07-13 LAB — HEMOGLOBIN A1C: Hgb A1c MFr Bld: 6.7 % — ABNORMAL HIGH (ref 4.6–6.5)

## 2020-07-13 LAB — LIPID PANEL
Cholesterol: 162 mg/dL (ref 0–200)
HDL: 75.5 mg/dL (ref >39.00)
LDL Cholesterol: 71 mg/dL (ref 0–99)
NonHDL: 86.92
Total CHOL/HDL Ratio: 2
Triglycerides: 78 mg/dL (ref 0.0–149.0)
VLDL: 15.6 mg/dL (ref 0.0–40.0)

## 2020-07-13 MED ORDER — TRAMADOL HCL 50 MG PO TABS: 50.0000 mg | ORAL_TABLET | Freq: Every day | ORAL | 3 refills | Status: DC | PRN

## 2020-07-13 NOTE — Patient Instructions (Signed)
We have refilled the tramadol for you for the pain.

## 2020-07-13 NOTE — Progress Notes (Signed)
   Subjective:   Patient ID: Sherry Lambert, female    DOB: August 01, 1941, 79 y.o.   MRN: [id number]  HPI The patient is a 79 YO female coming in for physical.  PMH, [place], social history reviewed and updated  Review of Systems  Constitutional: Negative.   HENT: Negative.   Eyes: Negative.   Respiratory: Negative for cough, chest tightness and shortness of breath.   Cardiovascular: Negative for chest pain, palpitations and leg swelling.  Gastrointestinal: Negative for abdominal distention, abdominal pain, constipation, diarrhea, nausea and vomiting.  Musculoskeletal: Negative.   Skin: Negative.   Neurological: Negative.   Psychiatric/Behavioral: Negative.     Objective:  Physical Exam Constitutional:      Appearance: She is well-developed.  HENT:     Head: Normocephalic and atraumatic.  Cardiovascular:     Rate and Rhythm: Normal rate and regular rhythm.  Pulmonary:     Effort: Pulmonary effort is normal. No respiratory distress.     Breath sounds: Normal breath sounds. No wheezing or rales.  Abdominal:     General: Bowel sounds are normal. There is no distension.     Palpations: Abdomen is soft.     Tenderness: There is no abdominal tenderness. There is no rebound.  Musculoskeletal:     Cervical back: Normal range of motion.  Skin:    General: Skin is warm and dry.  Neurological:     Mental Status: She is alert and oriented to person, place, and time. Mental status is at baseline.     Sensory: Sensory deficit present.     Coordination: Coordination abnormal.     Vitals:   07/13/20 1002  BP: (!) 180/80  Pulse: (!) 103  Temp: 97.7 F (36.5 C)  TempSrc: Oral  SpO2: 94%  Weight: 119 lb (54 kg)  Height: 4\' 11"  (1.499 m)    This visit occurred during the SARS-CoV-2 public health emergency.  Safety protocols were in place, including screening questions prior to the visit, additional usage of staff PPE, and extensive cleaning of exam room while observing appropriate  contact time as indicated for disinfecting solutions.   Assessment & Plan:  Flu shot given at visit

## 2020-07-14 ENCOUNTER — Telehealth: Payer: Self-pay

## 2020-07-14 NOTE — Telephone Encounter (Signed)
Results given and mailed.

## 2020-07-15 NOTE — Assessment & Plan Note (Signed)
Checking HgA1c, foot exam done. Reminded about yearly eye exam. Diet controlled, on statin.

## 2020-07-15 NOTE — Assessment & Plan Note (Signed)
Checking lipid panel and adjust pravastatin as needed.  

## 2020-07-15 NOTE — Assessment & Plan Note (Signed)
Stable, she does have better and worse days with function. Able to do all ADLs.

## 2020-07-15 NOTE — Assessment & Plan Note (Signed)
Stable, on statin. Advised to exercise daily.

## 2020-07-15 NOTE — Assessment & Plan Note (Signed)
Stable, on statin

## 2020-07-15 NOTE — Assessment & Plan Note (Signed)
Still taking trelegy which is doing well, no flare today.

## 2020-07-15 NOTE — Assessment & Plan Note (Signed)
Flu shot given at visit. Covid-19 vaccine up to date and discussed booster. Pneumonia up to date. Shingrix discussed. Tetanus due 2024. Colonoscopy aged out of further. Mammogram aged out of further, pap smear aged out of further and dexa declines at this time. Counseled about sun safety and mole surveillance. Counseled about the dangers of distracted driving. Given 10 year screening recommendations.

## 2020-07-15 NOTE — Assessment & Plan Note (Signed)
BP higher than normal today and she is not currently on meds. She requests monitoring as most readings normal and she has had some change in sodium levels recently.

## 2020-07-18 ENCOUNTER — Other Ambulatory Visit: Payer: Self-pay | Admitting: Internal Medicine

## 2020-07-18 NOTE — Telephone Encounter (Signed)
Ok forward to pcp 

## 2020-07-21 NOTE — Telephone Encounter (Signed)
   Patient calling for status of refills for pravastatin (PRAVACHOL) 20 MG tablet and gabapentin (NEURONTIN) 300 MG capsule. She states pharmacy denied having

## 2020-07-25 ENCOUNTER — Telehealth: Payer: Self-pay | Admitting: Internal Medicine

## 2020-07-25 ENCOUNTER — Other Ambulatory Visit: Payer: Self-pay

## 2020-07-25 MED ORDER — PRAVASTATIN SODIUM 20 MG PO TABS
20.0000 mg | ORAL_TABLET | Freq: Every day | ORAL | 0 refills | Status: DC
Start: 2020-07-25 — End: 2020-07-26

## 2020-07-25 NOTE — Telephone Encounter (Signed)
pravastatin (PRAVACHOL) 20 MG tablet [place], [place] Phone:  [phone]  Fax:  [phone]     Patient requesting a refill, only has one pill left.

## 2020-07-25 NOTE — Telephone Encounter (Signed)
Patient calling back made an appointment with Dr. [name] tomorrow 11.23.21

## 2020-07-25 NOTE — Telephone Encounter (Signed)
Patient states she would like to be referred to a urologist because she is having troubles urinating.

## 2020-07-26 ENCOUNTER — Other Ambulatory Visit: Payer: Self-pay

## 2020-07-26 ENCOUNTER — Ambulatory Visit (INDEPENDENT_AMBULATORY_CARE_PROVIDER_SITE_OTHER): Payer: Medicare HMO | Admitting: Internal Medicine

## 2020-07-26 ENCOUNTER — Encounter: Payer: Self-pay | Admitting: Internal Medicine

## 2020-07-26 VITALS — BP 166/80 | HR 78 | Temp 98.4°F | Wt 117.0 lb

## 2020-07-26 DIAGNOSIS — R339 Retention of urine, unspecified: Secondary | ICD-10-CM | POA: Insufficient documentation

## 2020-07-26 MED ORDER — NITROFURANTOIN MONOHYD MACRO 100 MG PO CAPS: 100.0000 mg | ORAL_CAPSULE | Freq: Two times a day (BID) | ORAL | 0 refills | Status: DC

## 2020-07-26 MED ORDER — PRAVASTATIN SODIUM 20 MG PO TABS
20.0000 mg | ORAL_TABLET | Freq: Every day | ORAL | 3 refills | Status: DC
Start: 2020-07-26 — End: 2021-09-29

## 2020-07-26 NOTE — Progress Notes (Signed)
   Subjective:   Patient ID: Sherry Lambert, female    DOB: March 10, 1941, 79 y.o.   MRN: [id number]  HPI The patient is a 79 YO female coming in for concerns about not emptying her bladder fully. She drinks plenty of fluids and feels that she is not urinating as much as she is taking in. She seems to go to urinate often small amounts throughout the day. Denies constant leaking although sometimes she does have small volume loss. This is not new. Denies pain with urination. Denies pressure in lower stomach. Denies fevers or chills.   Review of Systems  Constitutional: Positive for activity change. Negative for appetite change and fatigue.  HENT: Negative.   Eyes: Negative.   Respiratory: Negative for cough, chest tightness and shortness of breath.   Cardiovascular: Negative for chest pain, palpitations and leg swelling.  Gastrointestinal: Negative for abdominal distention, abdominal pain, constipation, diarrhea, nausea and vomiting.  Genitourinary: Positive for decreased urine volume and difficulty urinating.  Musculoskeletal: Positive for gait problem.  Skin: Negative.   Neurological: Positive for weakness and numbness.  Psychiatric/Behavioral: Negative.     Objective:  Physical Exam Constitutional:      Appearance: She is well-developed.  HENT:     Head: Normocephalic and atraumatic.  Cardiovascular:     Rate and Rhythm: Normal rate and regular rhythm.  Pulmonary:     Effort: Pulmonary effort is normal. No respiratory distress.     Breath sounds: Normal breath sounds. No wheezing or rales.  Abdominal:     General: Bowel sounds are normal. There is no distension.     Palpations: Abdomen is soft.     Tenderness: There is no abdominal tenderness. There is no rebound.  Musculoskeletal:     Cervical back: Normal range of motion.  Skin:    General: Skin is warm and dry.  Neurological:     Mental Status: She is alert and oriented to person, place, and time. Mental status is at  baseline.     Cranial Nerves: Cranial nerve deficit present.     Coordination: Coordination abnormal.     Comments: Walker for ambulation     Vitals:   07/26/20 1027  BP: (!) 166/80  Pulse: 78  Temp: 98.4 F (36.9 C)  TempSrc: Oral  SpO2: 93%  Weight: 117 lb (53.1 kg)    This visit occurred during the SARS-CoV-2 public health emergency.  Safety protocols were in place, including screening questions prior to the visit, additional usage of staff PPE, and extensive cleaning of exam room while observing appropriate contact time as indicated for disinfecting solutions.   Assessment & Plan:

## 2020-07-26 NOTE — Patient Instructions (Signed)
We will treat you for urinary infection with macrobid to take 1 pill twice a day for 1 week.   If you are not feeling better after that call and let us know as the next step would be to check if the bladder is emptying fully with a urologist.

## 2020-07-26 NOTE — Assessment & Plan Note (Signed)
Unable to produce sample today. Given rx macrobid to cover for infection. If this does not resolve symptoms she will bring in a sample to check for infection or other signs. If no etiology discovered will refer to urology for post void residual.

## 2020-07-28 DIAGNOSIS — J189 Pneumonia, unspecified organism: Secondary | ICD-10-CM | POA: Diagnosis not present

## 2020-07-28 DIAGNOSIS — J449 Chronic obstructive pulmonary disease, unspecified: Secondary | ICD-10-CM | POA: Diagnosis not present

## 2020-08-05 DIAGNOSIS — J449 Chronic obstructive pulmonary disease, unspecified: Secondary | ICD-10-CM | POA: Diagnosis not present

## 2020-08-05 DIAGNOSIS — J189 Pneumonia, unspecified organism: Secondary | ICD-10-CM | POA: Diagnosis not present

## 2020-08-15 ENCOUNTER — Ambulatory Visit: Payer: Medicare HMO | Admitting: Critical Care Medicine

## 2020-08-15 ENCOUNTER — Telehealth: Payer: Self-pay

## 2020-08-15 ENCOUNTER — Other Ambulatory Visit: Payer: Self-pay

## 2020-08-15 ENCOUNTER — Encounter: Payer: Self-pay | Admitting: Critical Care Medicine

## 2020-08-15 VITALS — BP 128/78 | HR 91 | Ht 59.0 in | Wt 120.4 lb

## 2020-08-15 DIAGNOSIS — J449 Chronic obstructive pulmonary disease, unspecified: Secondary | ICD-10-CM

## 2020-08-15 MED ORDER — TRELEGY ELLIPTA 100-62.5-25 MCG/INH IN AEPB: 1.0000 | INHALATION_SPRAY | Freq: Every day | RESPIRATORY_TRACT | 11 refills | Status: DC

## 2020-08-15 NOTE — Telephone Encounter (Signed)
[facility] staff spoke with patient and she stated that she is unable to come in sooner due to not having a ride. Dr. Carlis Abbott is aware. Nothing further needed at this time.

## 2020-08-15 NOTE — Progress Notes (Signed)
Synopsis: Referred in 2017 for COPD by [name], *. Formerly a patient of Dr. Murlean Iba.  Subjective:   PATIENT ID: Sherry Lambert GENDER: female DOB: 1941-07-28, MRN: [id number]  Chief Complaint  Patient presents with  . Follow-up    Patient has shortness of breath with exertion and feels like it might be a little worse since last visit, feels fine at rest, denies cough,wheezing.     Sherry Lambert is a 79 year old woman with a history of COPD and tobacco abuse who presents for follow-up.  She quit smoking 2004.  She is maintained on Trelegy and uses albuterol about once per day several days per week.  She has dyspnea on exertion when walking around with a mask, but she recovers quickly when she takes it off.  She has not noticed an increase from her baseline dyspnea with exertion when she is walking around at home.  No cough, wheezing, sputum production.  She walks around with a walker, but is active throughout the day.  She never misses doses of her Trelegy.  She has had pneumonia multiple times in the past, most recently in February/March 2021, which was precipitated by splinting from rib fractures.   She has been diligent with wearing her mask and is vaccinated x2 for Covid.  She is up-to-date on her seasonal flu and pneumonia vaccines.  She is planning on getting a booster soon.    OV 05/03/20: Sherry Lambert is a 79 year old with a history of COPD who presents for follow-up.  She is company today by her daughter.  She was hospitalized for 2 days in May 2021 for pneumonia, and was also hospitalized in March for the same.  She has been doing well until about 3 days ago when she noticed more dyspnea on exertion.  No fever, chills, or change in sputum quantity or quality.  She wonders if her shortness of breath is related to the hot humid weather.  She has been having baseline watery rhinorrhea in the morning and often clears her throat.  She is currently on treatment for oral candidiasis  with fluconazole to complete a 3-week course.  She has been referred to dermatology by her PCP for evaluation of tongue lesions. She is having difficulty affording her medications due to being in the donut hole on prescription drug coverage.   OV 12/22/19: Sherry Lambert is a 79 year old woman who presents for evaluation of COPD.  She is accompanied today by her son.  She was diagnosed with COPD about 5 years ago and was previously followed by Dr. Murlean Iba.  She remains on Trelegy inhaler once daily, which she has been on prior to hospitalization.  She was hospitalized from 2/23-2/25 for an acute COPD exacerbation due to community-acquired pneumonia.  She has had 1 previous hospitalization in the past for pneumonia.  She had fallen on her right side about a week prior and had not been taking deep breaths or coughing due to severe pain.  Since her hospitalization she does not feel that her breathing is back to baseline.  She denies cough and sputum production, but has dyspnea on exertion, especially when taking the steps and has occasional wheezing.  She can walk around room to room in her house, but has trouble walking from the waiting room to the exam room.  She has dyspnea even when walking around her house.  She tries to be active doing exercises when she sitting down due to her risk of falls.  She uses her  albuterol about half the days of the week, usually 1-2 times per day.  She quit smoking 2004 after 35 years x 1 pack/day.  She has a sister with emphysema and thinks that her mother likely had it as well.  She has chronic edema for which she wears compression socks which is at baseline.  No previous cardiac history.  She is up-to-date on her vaccines.   Past Medical History:  Diagnosis Date  . 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 [name]   . Cataract   . Chronic kidney disease    kidney stone  . Colitis   . Colon cancer ([place]) 06/2017   cecum  . Diabetes mellitus without complication  ([place])    no meds taken  . Emphysema of lung ([place])   . GERD (gastroesophageal reflux disease)   . Hyperlipidemia   . Hypertension   . Peripheral arterial disease ([place])   . [name]   . Stroke [facility])    November 2016- Thanksgiving night     Family History  Problem Relation Age of Onset  . Hypertension Father   . Diabetes Father   . Cancer Father        melanoma  . Emphysema Mother   . Colon cancer Sister 25  . Cancer Sister        colon, surgery alone   . Emphysema Sister   . Early death Neg Hx   . Stroke Neg Hx   . Esophageal cancer Neg Hx   . Rectal cancer Neg Hx   . Stomach cancer Neg Hx      Past Surgical History:  Procedure Laterality Date  . ABDOMINAL HYSTERECTOMY    . BALLOON DILATION N/A 12/23/2012   Procedure: BALLOON DILATION;  Surgeon: Inda Castle, MD;  Location: [facility];  Service: Endoscopy;  Laterality: N/A;  . BALLOON DILATION N/A 01/15/2013   Procedure: BALLOON DILATION;  Surgeon: Inda Castle, MD;  Location: [facility];  Service: Endoscopy;  Laterality: N/A;  . BALLOON DILATION N/A 07/22/2018   Procedure: BALLOON DILATION;  Surgeon: Mauri Pole, MD;  Location: [facility];  Service: Endoscopy;  Laterality: N/A;  . cataract     both eyes  . COLONOSCOPY    . COLONOSCOPY WITH PROPOFOL N/A 03/05/2017   Procedure: COLONOSCOPY WITH PROPOFOL;  Surgeon: Mauri Pole, MD;  Location: [facility];  Service: Endoscopy;  Laterality: N/A;  . DENTAL RESTORATION/EXTRACTION WITH X-RAY    . ESOPHAGOGASTRODUODENOSCOPY N/A 12/23/2012   Procedure: ESOPHAGOGASTRODUODENOSCOPY (EGD);  Surgeon: Inda Castle, MD;  Location: [facility];  Service: Endoscopy;  Laterality: N/A;  . ESOPHAGOGASTRODUODENOSCOPY N/A 01/15/2013   Procedure: ESOPHAGOGASTRODUODENOSCOPY (EGD);  Surgeon: Inda Castle, MD;  Location: [facility];  Service: Endoscopy;  Laterality: N/A;  . ESOPHAGOGASTRODUODENOSCOPY (EGD) WITH PROPOFOL N/A 07/22/2018   Procedure:  ESOPHAGOGASTRODUODENOSCOPY (EGD) WITH PROPOFOL;  Surgeon: Mauri Pole, MD;  Location: [facility];  Service: Endoscopy;  Laterality: N/A;  . TONSILLECTOMY      Social History   Socioeconomic History  . Marital status: Divorced    Spouse name: Not on file  . Number of children: 1  . Years of education: Not on file  . Highest education level: Not on file  Occupational History  . Occupation: Retired    Comment: Tax inspector  Tobacco Use  . Smoking status: Former Smoker    Packs/day: 1.00    Years: 55.00    Pack years: 55.00    Types: Cigarettes  Quit date: 10/19/2002    Years since quitting: 17.8  . Smokeless tobacco: Never Used  Vaping Use  . Vaping Use: Never used  Substance and Sexual Activity  . Alcohol use: No    Alcohol/week: 0.0 standard drinks  . Drug use: No  . Sexual activity: Never    Birth control/protection: Surgical  Other Topics Concern  . Not on file  Social History Narrative   Regular exercise-no   Caffeine Use-yes   Social Determinants of Health   Financial Resource Strain: Low Risk   . Difficulty of Paying Living Expenses: Not hard at all  Food Insecurity: No Food Insecurity  . Worried About Charity fundraiser in the Last Year: Never true  . Ran Out of Food in the Last Year: Never true  Transportation Needs: No Transportation Needs  . Lack of Transportation (Medical): No  . Lack of Transportation (Non-Medical): No  Physical Activity: Inactive  . Days of Exercise per Week: 0 days  . Minutes of Exercise per Session: 0 min  Stress: No Stress Concern Present  . Feeling of Stress : Not at all  Social Connections: Socially Isolated  . Frequency of Communication with Friends and Family: More than three times a week  . Frequency of Social Gatherings with Friends and Family: More than three times a week  . Attends Religious Services: Never  . Active Member of Clubs or Organizations: No  . Attends Archivist Meetings: Never  .  Marital Status: Widowed  Intimate Partner Violence: Not on file     Allergies  Allergen Reactions  . Lipitor [Atorvastatin] Itching     Immunization History  Administered Date(s) Administered  . Fluad Quad(high Dose 65+) 06/18/2019, 07/13/2020  . Influenza, High Dose Seasonal PF 07/27/2013, 05/14/2016, 05/20/2017, 06/05/2018  . Influenza,inj,Quad PF,6+ Mos 08/03/2014, 06/21/2015  . Influenza,inj,quad, With Preservative 06/04/2019  . Influenza-Unspecified 07/04/2012  . PFIZER SARS-COV-2 Vaccination 11/23/2019, 12/08/2019  . Pneumococcal Conjugate-13 11/11/2015  . Pneumococcal-Unspecified 09/03/2010  . Td 09/03/2012    Outpatient Medications Prior to Visit  Medication Sig Dispense Refill  . acetaminophen (TYLENOL) 500 MG tablet Take 500 mg by mouth daily.     Marland Kitchen albuterol (VENTOLIN HFA) 108 (90 Base) MCG/ACT inhaler Inhale 1-2 puffs into the lungs every 4 (four) hours as needed for wheezing or shortness of breath. 6.7 g 6  . aspirin EC 81 MG tablet Take 1 tablet (81 mg total) by mouth daily. 90 tablet 3  . gabapentin (NEURONTIN) 300 MG capsule TAKE 1 CAPSULE BY MOUTH THREE TIMES DAILY 90 capsule 6  . ipratropium-albuterol (DUONEB) 0.5-2.5 (3) MG/3ML SOLN Take 3 mLs by nebulization every 4 (four) hours as needed. 360 mL 6  . nitrofurantoin, macrocrystal-monohydrate, (MACROBID) 100 MG capsule Take 1 capsule (100 mg total) by mouth 2 (two) times daily. 14 capsule 0  . omeprazole (PRILOSEC) 40 MG capsule Take 1 capsule (40 mg total) by mouth daily. 90 capsule 3  . pravastatin (PRAVACHOL) 20 MG tablet Take 1 tablet (20 mg total) by mouth daily. 90 tablet 3  . traMADol (ULTRAM) 50 MG tablet Take 1 tablet (50 mg total) by mouth daily as needed. 30 tablet 3  . Fluticasone-Umeclidin-Vilant (TRELEGY ELLIPTA) 100-62.5-25 MCG/INH AEPB Inhale 1 puff into the lungs daily.    . Fluticasone-Umeclidin-Vilant (TRELEGY ELLIPTA) 100-62.5-25 MCG/INH AEPB Inhale 1 puff into the lungs daily. 60 each 11    No facility-administered medications prior to visit.    Review of Systems  Constitutional: Negative for chills  and fever.  HENT: Negative.   Respiratory: Positive for shortness of breath and wheezing. Negative for cough and sputum production.   Cardiovascular: Positive for leg swelling. Negative for chest pain.  Gastrointestinal: Negative for heartburn, nausea and vomiting.  Skin: Negative for rash.  Endo/Heme/Allergies: Negative for environmental allergies.     Objective:   Vitals:   08/15/20 1637  BP: 128/78  Pulse: 91  SpO2: 94%  Weight: 120 lb 6.4 oz (54.6 kg)  Height: 4\' 11"  (1.499 m)   94% on  RA BMI Readings from Last 3 Encounters:  08/15/20 24.32 kg/m  07/26/20 23.63 kg/m  07/13/20 24.04 kg/m   Wt Readings from Last 3 Encounters:  08/15/20 120 lb 6.4 oz (54.6 kg)  07/26/20 117 lb (53.1 kg)  07/13/20 119 lb (54 kg)    Physical Exam Vitals reviewed.  Constitutional:      General: She is not in acute distress.    Appearance: She is not ill-appearing.     Comments: Frail-appearing  HENT:     Head: Normocephalic and atraumatic.  Eyes:     General: No scleral icterus. Cardiovascular:     Rate and Rhythm: Normal rate and regular rhythm.     Heart sounds: No murmur heard.   Pulmonary:     Comments: Breathing comfortably on room air, no conversational dyspnea.  Clear to auscultation bilaterally. Abdominal:     General: There is no distension.     Palpations: Abdomen is soft.  Musculoskeletal:        General: No swelling or deformity.     Cervical back: Neck supple.  Lymphadenopathy:     Cervical: No cervical adenopathy.  Skin:    General: Skin is warm and dry.     Findings: No rash.  Neurological:     Mental Status: She is alert.     Coordination: Coordination normal.     Comments: Ambulates with a walker.  Psychiatric:        Mood and Affect: Mood normal.        Behavior: Behavior normal.      CBC    Component Value Date/Time   WBC  10.2 01/16/2020 0431   RBC 4.24 01/16/2020 0431   HGB 12.2 01/16/2020 0431   HGB 11.4 (L) 07/30/2017 1116   HCT 39.3 01/16/2020 0431   HCT 36.1 07/30/2017 1116   PLT 211 01/16/2020 0431   PLT 252 07/30/2017 1116   MCV 92.7 01/16/2020 0431   MCV 88.6 07/30/2017 1116   MCH 28.8 01/16/2020 0431   MCHC 31.0 01/16/2020 0431   RDW 13.6 01/16/2020 0431   RDW 14.3 07/30/2017 1116   LYMPHSABS 1.0 10/28/2019 0457   LYMPHSABS 1.5 07/30/2017 1116   MONOABS 0.5 10/28/2019 0457   MONOABS 0.5 07/30/2017 1116   EOSABS 0.0 10/28/2019 0457   EOSABS 0.1 07/30/2017 1116   BASOSABS 0.0 10/28/2019 0457   BASOSABS 0.1 07/30/2017 1116    CHEMISTRY No results for input(s): NA, K, CL, CO2, GLUCOSE, BUN, CREATININE, CALCIUM, MG, PHOS in the last 168 hours. CrCl cannot be calculated (Patient's most recent lab result is older than the maximum 21 days allowed.).   Chest Imaging- films reviewed: CTA Chest 10/27/2019-emphysema, bilateral dependent pleural effusions.  No PE, no significant mediastinal or hilar adenopathy.  Hernia with stomach and abdominal fat contents protruding into the mediastinum.  Pulmonary Functions Testing Results: PFT Results Latest Ref Rng & Units 02/13/2016  FVC-Pre L 1.82  FVC-Predicted Pre % 76  FVC-Post L  2.02  FVC-Predicted Post % 84  Pre FEV1/FVC % % 45  Post FEV1/FCV % % 47  FEV1-Pre L 0.82  FEV1-Predicted Pre % 46  FEV1-Post L 0.94  DLCO uncorrected ml/min/mmHg 9.75  DLCO UNC% % 51  DLCO corrected ml/min/mmHg 9.26  DLCO COR %Predicted % 49  DLVA Predicted % 64   2017- severe obstruction with out significant bronchodilator reversibility.  Moderate diffusion impairment.  Flow volume loop supports obstruction.  2019 spirometry: FVC 1.8 (75%) FEV1 0.7 (39%) Ratio 38%      Assessment & Plan:     ICD-10-CM   1. Chronic obstructive pulmonary disease, unspecified COPD type ([place])  J44.9 Pulmonary function test    COPD with 1 AE in the past year due to  community-acquired pneumonia March 2021.  Pneumonia precipitated by suboptimal pulmonary hygiene due to splinting from rib pain. -Continue Trelegy once daily. Use albuterol once daily before using Trelegy.  Continue to rinse well after every use given history of thrush.  We discussed the possibility of de-escalating her therapy to Anoro given her history of pneumonia and previous history of thrush.  She feels that these are both well controlled and would like to stay on Trelegy for now. -Continue albuterol and DuoNebs every 4 hours as needed. -Up-to-date on seasonal flu, pneumonia, Covid vaccines. Recommend Covid booster, which she is planning on getting when she can get transportation to the pharmacy.   History of tobacco abuse (quit in 2004) -Not a candidate for screening given that she quit >15 years ago  RTC in 3 months with Dr. Loanne Drilling.   Current Outpatient Medications:  .  acetaminophen (TYLENOL) 500 MG tablet, Take 500 mg by mouth daily. , Disp: , Rfl:  .  albuterol (VENTOLIN HFA) 108 (90 Base) MCG/ACT inhaler, Inhale 1-2 puffs into the lungs every 4 (four) hours as needed for wheezing or shortness of breath., Disp: 6.7 g, Rfl: 6 .  aspirin EC 81 MG tablet, Take 1 tablet (81 mg total) by mouth daily., Disp: 90 tablet, Rfl: 3 .  gabapentin (NEURONTIN) 300 MG capsule, TAKE 1 CAPSULE BY MOUTH THREE TIMES DAILY, Disp: 90 capsule, Rfl: 6 .  ipratropium-albuterol (DUONEB) 0.5-2.5 (3) MG/3ML SOLN, Take 3 mLs by nebulization every 4 (four) hours as needed., Disp: 360 mL, Rfl: 6 .  nitrofurantoin, macrocrystal-monohydrate, (MACROBID) 100 MG capsule, Take 1 capsule (100 mg total) by mouth 2 (two) times daily., Disp: 14 capsule, Rfl: 0 .  omeprazole (PRILOSEC) 40 MG capsule, Take 1 capsule (40 mg total) by mouth daily., Disp: 90 capsule, Rfl: 3 .  pravastatin (PRAVACHOL) 20 MG tablet, Take 1 tablet (20 mg total) by mouth daily., Disp: 90 tablet, Rfl: 3 .  traMADol (ULTRAM) 50 MG tablet, Take 1 tablet  (50 mg total) by mouth daily as needed., Disp: 30 tablet, Rfl: 3 .  Fluticasone-Umeclidin-Vilant (TRELEGY ELLIPTA) 100-62.5-25 MCG/INH AEPB, Inhale 1 puff into the lungs daily., Disp: 1 each, Rfl: [place] [facility] , DO [facility] 08/15/2020 5:06 PM

## 2020-08-15 NOTE — Patient Instructions (Addendum)
Thank you for visiting Dr. Carlis Abbott at [facility]. We recommend the following: Orders Placed This Encounter  Procedures   Pulmonary function test   Orders Placed This Encounter  Procedures   Pulmonary function test    Standing Status:   Future    Standing Expiration Date:   08/15/2021    Order Specific Question:   Where should this test be performed?    Answer:    Pulmonary    Order Specific Question:   Full PFT: includes the following: basic spirometry, spirometry pre & post bronchodilator, diffusion capacity (DLCO), lung volumes    Answer:   Full PFT      Return in about 3 months (around 11/13/2020). with Dr. Loanne Drilling (30 minutes visit).    Please do your part to reduce the spread of COVID-19.

## 2020-08-15 NOTE — Telephone Encounter (Signed)
Sherry Lambert patient to see if she can come in earlier for her appointment today as Dr. Carlis Abbott has a meeting she needs to attend.   If patient calls back please see if she can get in sooner today or message me

## 2020-08-27 DIAGNOSIS — J449 Chronic obstructive pulmonary disease, unspecified: Secondary | ICD-10-CM | POA: Diagnosis not present

## 2020-08-27 DIAGNOSIS — J189 Pneumonia, unspecified organism: Secondary | ICD-10-CM | POA: Diagnosis not present

## 2020-09-05 DIAGNOSIS — J189 Pneumonia, unspecified organism: Secondary | ICD-10-CM | POA: Diagnosis not present

## 2020-09-05 DIAGNOSIS — J449 Chronic obstructive pulmonary disease, unspecified: Secondary | ICD-10-CM | POA: Diagnosis not present

## 2020-09-16 DIAGNOSIS — J309 Allergic rhinitis, unspecified: Secondary | ICD-10-CM | POA: Diagnosis not present

## 2020-09-16 DIAGNOSIS — G8929 Other chronic pain: Secondary | ICD-10-CM | POA: Diagnosis not present

## 2020-09-16 DIAGNOSIS — K581 Irritable bowel syndrome with constipation: Secondary | ICD-10-CM | POA: Diagnosis not present

## 2020-09-16 DIAGNOSIS — K219 Gastro-esophageal reflux disease without esophagitis: Secondary | ICD-10-CM | POA: Diagnosis not present

## 2020-09-16 DIAGNOSIS — I87309 Chronic venous hypertension (idiopathic) without complications of unspecified lower extremity: Secondary | ICD-10-CM | POA: Diagnosis not present

## 2020-09-16 DIAGNOSIS — E785 Hyperlipidemia, unspecified: Secondary | ICD-10-CM | POA: Diagnosis not present

## 2020-09-16 DIAGNOSIS — E1151 Type 2 diabetes mellitus with diabetic peripheral angiopathy without gangrene: Secondary | ICD-10-CM | POA: Diagnosis not present

## 2020-09-16 DIAGNOSIS — E1142 Type 2 diabetes mellitus with diabetic polyneuropathy: Secondary | ICD-10-CM | POA: Diagnosis not present

## 2020-09-16 DIAGNOSIS — I69351 Hemiplegia and hemiparesis following cerebral infarction affecting right dominant side: Secondary | ICD-10-CM | POA: Diagnosis not present

## 2020-09-16 DIAGNOSIS — J449 Chronic obstructive pulmonary disease, unspecified: Secondary | ICD-10-CM | POA: Diagnosis not present

## 2020-09-27 DIAGNOSIS — J189 Pneumonia, unspecified organism: Secondary | ICD-10-CM | POA: Diagnosis not present

## 2020-09-27 DIAGNOSIS — J449 Chronic obstructive pulmonary disease, unspecified: Secondary | ICD-10-CM | POA: Diagnosis not present

## 2020-10-06 DIAGNOSIS — J449 Chronic obstructive pulmonary disease, unspecified: Secondary | ICD-10-CM | POA: Diagnosis not present

## 2020-10-06 DIAGNOSIS — J189 Pneumonia, unspecified organism: Secondary | ICD-10-CM | POA: Diagnosis not present

## 2020-10-21 ENCOUNTER — Telehealth: Payer: Self-pay | Admitting: Internal Medicine

## 2020-10-21 NOTE — Progress Notes (Signed)
  Chronic Care Management   Note  10/21/2020 Name: Sherry Lambert MRN: [id number] DOB: 08-18-1941  Sherry Lambert is a 80 y.o. year old female who is a primary care patient of [name], MD. I reached out to Carollee Massed by phone today in response to a referral sent by Sherry Lambert's PCP, [name], MD.   Sherry Lambert was given information about Chronic Care Management services today including:  1. CCM service includes personalized support from designated clinical staff supervised by her physician, including individualized plan of care and coordination with other care providers 2. 24/7 contact phone numbers for assistance for urgent and routine care needs. 3. Service will only be billed when office clinical staff spend 20 minutes or more in a month to coordinate care. 4. Only one practitioner may furnish and bill the service in a calendar month. 5. The patient may stop CCM services at any time (effective at the end of the month) by phone call to the office staff.   Patient agreed to services and verbal consent obtained.   Follow up plan:   Carley Perdue [employer] Scheduler

## 2020-10-26 ENCOUNTER — Ambulatory Visit: Payer: Medicare HMO | Admitting: Internal Medicine

## 2020-10-27 ENCOUNTER — Ambulatory Visit: Payer: Medicare HMO | Admitting: Internal Medicine

## 2020-10-28 DIAGNOSIS — J449 Chronic obstructive pulmonary disease, unspecified: Secondary | ICD-10-CM | POA: Diagnosis not present

## 2020-10-28 DIAGNOSIS — J189 Pneumonia, unspecified organism: Secondary | ICD-10-CM | POA: Diagnosis not present

## 2020-10-29 ENCOUNTER — Other Ambulatory Visit (HOSPITAL_COMMUNITY)
Admission: RE | Admit: 2020-10-29 | Discharge: 2020-10-29 | Disposition: A | Discharge status: Home / Self Care | Payer: Medicare HMO | Source: Ambulatory Visit | Attending: Pulmonary Disease | Admitting: Pulmonary Disease

## 2020-10-29 DIAGNOSIS — Z20822 Contact with and (suspected) exposure to covid-19: Secondary | ICD-10-CM | POA: Insufficient documentation

## 2020-10-29 LAB — SARS CORONAVIRUS 2 (TAT 6-24 HRS): SARS Coronavirus 2: NEGATIVE

## 2020-10-31 ENCOUNTER — Telehealth: Payer: Self-pay | Admitting: Internal Medicine

## 2020-10-31 NOTE — Telephone Encounter (Signed)
See below

## 2020-10-31 NOTE — Telephone Encounter (Signed)
Per [place] database she should still have another refill as this was done in November with 3 refills and she has only filled 3 times total. Did she call pharmacy to refill?

## 2020-10-31 NOTE — Telephone Encounter (Signed)
[facility]   1.Medication Requested:  traMADol (ULTRAM) 50 MG tablet  2. Pharmacy (Name, Street, [place]): [facility], [place] Phone:  [phone]  Fax:  [phone]      3. On Med List: Yes    4. Last Visit with PCP: 07/26/2020  5. Next visit date with PCP: 01/10/2021   Agent: Please be advised that RX refills may take up to 3 business days. We ask that you follow-up with your pharmacy.

## 2020-11-01 ENCOUNTER — Other Ambulatory Visit: Payer: Self-pay

## 2020-11-01 ENCOUNTER — Ambulatory Visit: Payer: Medicare HMO | Admitting: Pulmonary Disease

## 2020-11-01 ENCOUNTER — Encounter: Payer: Self-pay | Admitting: Pulmonary Disease

## 2020-11-01 ENCOUNTER — Ambulatory Visit (INDEPENDENT_AMBULATORY_CARE_PROVIDER_SITE_OTHER): Payer: Medicare HMO | Admitting: Critical Care Medicine

## 2020-11-01 VITALS — BP 142/78 | HR 102 | Temp 97.8°F | Ht 60.0 in | Wt 114.0 lb

## 2020-11-01 DIAGNOSIS — B370 Candidal stomatitis: Secondary | ICD-10-CM

## 2020-11-01 DIAGNOSIS — J449 Chronic obstructive pulmonary disease, unspecified: Secondary | ICD-10-CM

## 2020-11-01 LAB — PULMONARY FUNCTION TEST
DL/VA % pred: 80 %
DL/VA: 3.37 ml/min/mmHg/L
DLCO cor % pred: 60 %
DLCO cor: 9.89 ml/min/mmHg
DLCO unc % pred: 60 %
DLCO unc: 9.89 ml/min/mmHg
FEF 25-75 Post: 0.38 L/sec
FEF 25-75 Pre: 0.4 L/sec
FEF2575-%Change-Post: -4 %
FEF2575-%Pred-Post: 29 %
FEF2575-%Pred-Pre: 31 %
FEV1-%Change-Post: -4 %
FEV1-%Pred-Post: 50 %
FEV1-%Pred-Pre: 52 %
FEV1-Post: 0.83 L
FEV1-Pre: 0.87 L
FEV1FVC-%Change-Post: -2 %
FEV1FVC-%Pred-Pre: 64 %
FEV6-%Change-Post: -2 %
FEV6-%Pred-Post: 84 %
FEV6-%Pred-Pre: 86 %
FEV6-Post: 1.77 L
FEV6-Pre: 1.82 L
FEV6FVC-%Change-Post: 0 %
FEV6FVC-%Pred-Post: 106 %
FEV6FVC-%Pred-Pre: 105 %
FVC-%Change-Post: -1 %
FVC-%Pred-Post: 81 %
FVC-%Pred-Pre: 82 %
FVC-Post: 1.8 L
FVC-Pre: 1.82 L
Post FEV1/FVC ratio: 46 %
Post FEV6/FVC ratio: 100 %
Pre FEV1/FVC ratio: 48 %
Pre FEV6/FVC Ratio: 100 %
RV % pred: 159 %
RV: 3.43 L
TLC % pred: 113 %
TLC: 5.04 L

## 2020-11-01 MED ORDER — ANORO ELLIPTA 62.5-25 MCG/INH IN AEPB: 1.0000 | INHALATION_SPRAY | Freq: Every day | RESPIRATORY_TRACT | 0 refills | Status: DC

## 2020-11-01 NOTE — Progress Notes (Signed)
Synopsis: Referred in 2017 for COPD by [name], *. Formerly a patient of Dr. Murlean Iba.  Subjective:   PATIENT ID: Sherry Lambert GENDER: female DOB: July 09, 1941, MRN: [id number]  Chief Complaint  Patient presents with  . Follow-up    Follow up after PFT   [name] is a 80 year old female former smoker with COPD who presents for follow-up.   She is a former patient of Dr. Carlis Abbott. She was diagnosed with COPD in 2016 and has been maintained on Trelegy. Her last COPD exacerbation was one year ago Feb requiring hospitalization. She previously had thrush but has resolved with mouth rinses and chronic fluconazole therapy prescribed by her Dermatologist. Symptoms otherwise controlled with no shortness of breath, cough or wheezing.  Social history: Quit in 2004. 55 pack-years  Past Medical History:  Diagnosis Date  . 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 [name]   . Cataract   . Chronic kidney disease    kidney stone  . Colitis   . Colon cancer ([place]) 06/2017   cecum  . Diabetes mellitus without complication ([place])    no meds taken  . Emphysema of lung ([place])   . GERD (gastroesophageal reflux disease)   . Hyperlipidemia   . Hypertension   . Peripheral arterial disease ([place])   . [name]   . Stroke [facility])    November 2016- Thanksgiving night     Family History  Problem Relation Age of Onset  . Hypertension Father   . Diabetes Father   . Cancer Father        melanoma  . Emphysema Mother   . Colon cancer Sister 50  . Cancer Sister        colon, surgery alone   . Emphysema Sister   . Early death Neg Hx   . Stroke Neg Hx   . Esophageal cancer Neg Hx   . Rectal cancer Neg Hx   . Stomach cancer Neg Hx      Past Surgical History:  Procedure Laterality Date  . ABDOMINAL HYSTERECTOMY    . BALLOON DILATION N/A 12/23/2012   Procedure: BALLOON DILATION;  Surgeon: Inda Castle, MD;  Location: [facility];  Service: Endoscopy;   Laterality: N/A;  . BALLOON DILATION N/A 01/15/2013   Procedure: BALLOON DILATION;  Surgeon: Inda Castle, MD;  Location: [facility];  Service: Endoscopy;  Laterality: N/A;  . BALLOON DILATION N/A 07/22/2018   Procedure: BALLOON DILATION;  Surgeon: Mauri Pole, MD;  Location: [facility];  Service: Endoscopy;  Laterality: N/A;  . cataract     both eyes  . COLONOSCOPY    . COLONOSCOPY WITH PROPOFOL N/A 03/05/2017   Procedure: COLONOSCOPY WITH PROPOFOL;  Surgeon: Mauri Pole, MD;  Location: [facility];  Service: Endoscopy;  Laterality: N/A;  . DENTAL RESTORATION/EXTRACTION WITH X-RAY    . ESOPHAGOGASTRODUODENOSCOPY N/A 12/23/2012   Procedure: ESOPHAGOGASTRODUODENOSCOPY (EGD);  Surgeon: Inda Castle, MD;  Location: [facility];  Service: Endoscopy;  Laterality: N/A;  . ESOPHAGOGASTRODUODENOSCOPY N/A 01/15/2013   Procedure: ESOPHAGOGASTRODUODENOSCOPY (EGD);  Surgeon: Inda Castle, MD;  Location: [facility];  Service: Endoscopy;  Laterality: N/A;  . ESOPHAGOGASTRODUODENOSCOPY (EGD) WITH PROPOFOL N/A 07/22/2018   Procedure: ESOPHAGOGASTRODUODENOSCOPY (EGD) WITH PROPOFOL;  Surgeon: Mauri Pole, MD;  Location: [facility];  Service: Endoscopy;  Laterality: N/A;  . TONSILLECTOMY      Social History   Socioeconomic History  . Marital status: Divorced  Spouse name: Not on file  . Number of children: 1  . Years of education: Not on file  . Highest education level: Not on file  Occupational History  . Occupation: Retired    Comment: Tax inspector  Tobacco Use  . Smoking status: Former Smoker    Packs/day: 1.00    Years: 55.00    Pack years: 55.00    Types: Cigarettes    Quit date: 10/19/2002    Years since quitting: 18.0  . Smokeless tobacco: Never Used  Vaping Use  . Vaping Use: Never used  Substance and Sexual Activity  . Alcohol use: No    Alcohol/week: 0.0 standard drinks  . Drug use: No  . Sexual activity: Never    Birth control/protection:  Surgical  Other Topics Concern  . Not on file  Social History Narrative   Regular exercise-no   Caffeine Use-yes   Social Determinants of Health   Financial Resource Strain: Low Risk   . Difficulty of Paying Living Expenses: Not hard at all  Food Insecurity: No Food Insecurity  . Worried About Charity fundraiser in the Last Year: Never true  . Ran Out of Food in the Last Year: Never true  Transportation Needs: No Transportation Needs  . Lack of Transportation (Medical): No  . Lack of Transportation (Non-Medical): No  Physical Activity: Inactive  . Days of Exercise per Week: 0 days  . Minutes of Exercise per Session: 0 min  Stress: No Stress Concern Present  . Feeling of Stress : Not at all  Social Connections: Socially Isolated  . Frequency of Communication with Friends and Family: More than three times a week  . Frequency of Social Gatherings with Friends and Family: More than three times a week  . Attends Religious Services: Never  . Active Member of Clubs or Organizations: No  . Attends Archivist Meetings: Never  . Marital Status: Widowed  Intimate Partner Violence: Not on file     Allergies  Allergen Reactions  . Lipitor [Atorvastatin] Itching     Immunization History  Administered Date(s) Administered  . Fluad Quad(high Dose 65+) 06/18/2019, 07/13/2020  . Influenza, High Dose Seasonal PF 07/27/2013, 05/14/2016, 05/20/2017, 06/05/2018  . Influenza,inj,Quad PF,6+ Mos 08/03/2014, 06/21/2015  . Influenza,inj,quad, With Preservative 06/04/2019  . Influenza-Unspecified 07/04/2012  . PFIZER(Purple Top)SARS-COV-2 Vaccination 11/23/2019, 12/08/2019  . Pneumococcal Conjugate-13 11/11/2015  . Pneumococcal-Unspecified 09/03/2010  . Td 09/03/2012    Outpatient Medications Prior to Visit  Medication Sig Dispense Refill  . acetaminophen (TYLENOL) 500 MG tablet Take 500 mg by mouth daily.     Marland Kitchen albuterol (VENTOLIN HFA) 108 (90 Base) MCG/ACT inhaler Inhale 1-2  puffs into the lungs every 4 (four) hours as needed for wheezing or shortness of breath. 6.7 g 6  . aspirin EC 81 MG tablet Take 1 tablet (81 mg total) by mouth daily. 90 tablet 3  . Fluticasone-Umeclidin-Vilant (TRELEGY ELLIPTA) 100-62.5-25 MCG/INH AEPB Inhale 1 puff into the lungs daily. 1 each 11  . gabapentin (NEURONTIN) 300 MG capsule TAKE 1 CAPSULE BY MOUTH THREE TIMES DAILY 90 capsule 6  . ipratropium-albuterol (DUONEB) 0.5-2.5 (3) MG/3ML SOLN Take 3 mLs by nebulization every 4 (four) hours as needed. 360 mL 6  . nitrofurantoin, macrocrystal-monohydrate, (MACROBID) 100 MG capsule Take 1 capsule (100 mg total) by mouth 2 (two) times daily. 14 capsule 0  . omeprazole (PRILOSEC) 40 MG capsule Take 1 capsule (40 mg total) by mouth daily. 90 capsule 3  . pravastatin (  PRAVACHOL) 20 MG tablet Take 1 tablet (20 mg total) by mouth daily. 90 tablet 3  . traMADol (ULTRAM) 50 MG tablet Take 1 tablet (50 mg total) by mouth daily as needed. 30 tablet 3   No facility-administered medications prior to visit.    Review of Systems  Constitutional: Negative for chills, diaphoresis, fever, malaise/fatigue and weight loss.  HENT: Negative for congestion.   Respiratory: Negative for cough, hemoptysis, sputum production, shortness of breath and wheezing.   Cardiovascular: Negative for chest pain, palpitations and leg swelling.     Objective:   Vitals:   11/01/20 1608  BP: (!) 142/78  Pulse: (!) 102  Temp: 97.8 F (36.6 C)  TempSrc: Tympanic  SpO2: 95%  Weight: 114 lb (51.7 kg)  Height: 5' (1.524 m)      BMI Readings from Last 3 Encounters:  08/15/20 24.32 kg/m  07/26/20 23.63 kg/m  07/13/20 24.04 kg/m   Wt Readings from Last 3 Encounters:  08/15/20 120 lb 6.4 oz (54.6 kg)  07/26/20 117 lb (53.1 kg)  07/13/20 119 lb (54 kg)   Physical Exam: General: Frail-appearing, no acute distress HENT: [place], AT, OP clear, MMM Eyes: EOMI, no scleral icterus Respiratory: Clear to auscultation  bilaterally.  No crackles, wheezing or rales Cardiovascular: RRR, -M/R/G, no JVD Extremities:-Edema,-tenderness Neuro: AAO x4, CNII-XII grossly intact Skin: Intact, no rashes or bruising Psych: Normal mood, normal affect   CBC    Component Value Date/Time   WBC 10.2 01/16/2020 0431   RBC 4.24 01/16/2020 0431   HGB 12.2 01/16/2020 0431   HGB 11.4 (L) 07/30/2017 1116   HCT 39.3 01/16/2020 0431   HCT 36.1 07/30/2017 1116   PLT 211 01/16/2020 0431   PLT 252 07/30/2017 1116   MCV 92.7 01/16/2020 0431   MCV 88.6 07/30/2017 1116   MCH 28.8 01/16/2020 0431   MCHC 31.0 01/16/2020 0431   RDW 13.6 01/16/2020 0431   RDW 14.3 07/30/2017 1116   LYMPHSABS 1.0 10/28/2019 0457   LYMPHSABS 1.5 07/30/2017 1116   MONOABS 0.5 10/28/2019 0457   MONOABS 0.5 07/30/2017 1116   EOSABS 0.0 10/28/2019 0457   EOSABS 0.1 07/30/2017 1116   BASOSABS 0.0 10/28/2019 0457   BASOSABS 0.1 07/30/2017 1116    CHEMISTRY No results for input(s): NA, K, CL, CO2, GLUCOSE, BUN, CREATININE, CALCIUM, MG, PHOS in the last 168 hours. CrCl cannot be calculated (Patient's most recent lab result is older than the maximum 21 days allowed.).   Chest Imaging- films reviewed: CTA Chest 10/27/2019-emphysema, bilateral dependent pleural effusions.  No PE, no significant mediastinal or hilar adenopathy.  Hernia with stomach and abdominal fat contents protruding into the mediastinum.  Pulmonary Functions Testing Results: PFT Results Latest Ref Rng & Units 11/01/2020 02/13/2016  FVC-Pre L 1.82 1.82  FVC-Predicted Pre % 82 76  FVC-Post L 1.80 2.02  FVC-Predicted Post % 81 84  Pre FEV1/FVC % % 48 45  Post FEV1/FCV % % 46 47  FEV1-Pre L 0.87 0.82  FEV1-Predicted Pre % 52 46  FEV1-Post L 0.83 0.94  DLCO uncorrected ml/min/mmHg 9.89 9.75  DLCO UNC% % 60 51  DLCO corrected ml/min/mmHg 9.89 9.26  DLCO COR %Predicted % 60 49  DLVA Predicted % 80 64  TLC L 5.04 -  TLC % Predicted % 113 -  RV % Predicted % 159 -   2017- severe  obstruction with out significant bronchodilator reversibility.  Moderate diffusion impairment.  Flow volume loop supports obstruction.  2019 spirometry: FVC 1.8 (75%)  FEV1 0.7 (39%) Ratio 38%  PFT 11/01/20 FVC 1.8 (81%) FEV1 0.83 (50%) Ratio 48  TLC 113% RV 159% DLCO 60% Interpretation: Moderately severe obstructive defect with air trapping and reduced DLCO consistent with emphysema.  Imaging, labs and test noted above have been reviewed independently by me.    Assessment & Plan:     ICD-10-CM   1. COPD with chronic bronchitis and emphysema [facility])  J35.29     80 year old female with COPD who presents to establish care with new provider. Previously followed by Dr. Carlis Abbott. PFTs completed and reviewed in-clinic together. Unchanged severity with moderately severe obstruction seen. With the presence of air trapping and reduced gas exchange, her PFTs are consistent with emphysema. Has difficulty controlling thrush on her current inhaler.  COPD with emphysema Thrush --STOP Trelegy  --START ANORO ONE puff ONCE a day --CONTINUE Albuterol or Duonebs AS NEEDED --STOP fluconazole after one week  Health Maintenance Immunization History  Administered Date(s) Administered  . Fluad Quad(high Dose 65+) 06/18/2019, 07/13/2020  . Influenza, High Dose Seasonal PF 07/27/2013, 05/14/2016, 05/20/2017, 06/05/2018  . Influenza,inj,Quad PF,6+ Mos 08/03/2014, 06/21/2015  . Influenza,inj,quad, With Preservative 06/04/2019  . Influenza-Unspecified 07/04/2012  . PFIZER(Purple Top)SARS-COV-2 Vaccination 11/23/2019, 12/08/2019  . Pneumococcal Conjugate-13 11/11/2015  . Pneumococcal-Unspecified 09/03/2010  . Td 09/03/2012   CT Lung Screening - not a candidate. Quit smoking > 15 years ago  I have spent a total time of 40-minutes on the day of the appointment reviewing prior documentation, coordinating care and discussing medical diagnosis and plan with the patient/family. Imaging, labs and tests included in  this note have been reviewed and interpreted independently by me.  Ajia Chadderdon Rodman Pickle, MD [facility] 11/01/2020 3:36 PM

## 2020-11-01 NOTE — Patient Instructions (Addendum)
COPD with emphysema --STOP Trelegy  --START ANORO ONE puff ONCE a day --CONTINUE Albuterol or Duonebs AS NEEDED --STOP fluconazole after one week  Follow-up with me in one month

## 2020-11-01 NOTE — Progress Notes (Signed)
Full PFT performed today. °

## 2020-11-01 NOTE — Telephone Encounter (Signed)
LVM with the information down below. Office number was provided in case she has any questions or concerns.

## 2020-11-03 DIAGNOSIS — J189 Pneumonia, unspecified organism: Secondary | ICD-10-CM | POA: Diagnosis not present

## 2020-11-03 DIAGNOSIS — J449 Chronic obstructive pulmonary disease, unspecified: Secondary | ICD-10-CM | POA: Diagnosis not present

## 2020-11-04 ENCOUNTER — Encounter: Payer: Self-pay | Admitting: Internal Medicine

## 2020-11-04 ENCOUNTER — Other Ambulatory Visit: Payer: Self-pay

## 2020-11-04 ENCOUNTER — Ambulatory Visit (INDEPENDENT_AMBULATORY_CARE_PROVIDER_SITE_OTHER): Payer: Medicare HMO | Admitting: Internal Medicine

## 2020-11-04 VITALS — BP 122/90 | HR 78 | Temp 98.2°F | Resp 18 | Ht 60.0 in | Wt 113.6 lb

## 2020-11-04 DIAGNOSIS — R230 Cyanosis: Secondary | ICD-10-CM | POA: Insufficient documentation

## 2020-11-04 NOTE — Assessment & Plan Note (Addendum)
She does have likely poor arterial flow and will recheck US arterial bilateral LE to ensure she does have not new arterial disease above the ankle which may be amenable to intervention. We did discuss previous findings of poor arterial flow below the ankle level bilaterally which there is not really an intervention for. We did also discuss good foot care and monitoring for ulcers or sores as she would likely not be able to heal this and if that developed may need surgery.

## 2020-11-04 NOTE — Progress Notes (Signed)
   Subjective:   Patient ID: Sherry Lambert, female    DOB: 28-Oct-1940, 81 y.o.   MRN: [id number]  HPI The patient is a 80 YO female coming in for concerns about redness/blue color to her toes. Started about 3 weeks ago. Having some pain associated with this. She is able to rub them and then it gets better for a little while. Denies known injury. Prior smoker although quit in 2004 and has no resumption. Does have diabetes and prior stroke although under good control and no new stroke. She does take aspirin 81 mg daily. Prior imaging with poor circulation in the toes and consultation with vasc surgery 2019 with overall not many options as she had good circulation to the ankle.   Review of Systems  Constitutional: Negative.   HENT: Negative.   Eyes: Negative.   Respiratory: Negative for cough, chest tightness and shortness of breath.   Cardiovascular: Negative for chest pain, palpitations and leg swelling.  Gastrointestinal: Negative for abdominal distention, abdominal pain, constipation, diarrhea, nausea and vomiting.  Musculoskeletal: Negative.   Skin: Positive for color change.  Neurological: Positive for weakness.  Psychiatric/Behavioral: Negative.     Objective:  Physical Exam Constitutional:      Appearance: She is well-developed and well-nourished.  HENT:     Head: Normocephalic and atraumatic.  Eyes:     Extraocular Movements: EOM normal.  Cardiovascular:     Rate and Rhythm: Normal rate and regular rhythm.  Pulmonary:     Effort: Pulmonary effort is normal. No respiratory distress.     Breath sounds: Normal breath sounds. No wheezing or rales.  Abdominal:     General: Bowel sounds are normal. There is no distension.     Palpations: Abdomen is soft.     Tenderness: There is no abdominal tenderness. There is no rebound.  Musculoskeletal:        General: No edema.     Cervical back: Normal range of motion.  Skin:    General: Skin is warm and dry.     Comments: Toes on  both feet with purplish appearance, left 2-3rd toes with darker blue color at the distal end and cooler in appearance than other toes. All toes on both feet are cool with sluggish cap refill  Neurological:     Mental Status: She is alert and oriented to person, place, and time. Mental status is at baseline.     Coordination: Coordination abnormal.  Psychiatric:        Mood and Affect: Mood and affect normal.     Vitals:   11/04/20 0821  BP: 122/90  Pulse: 78  Resp: 18  Temp: 98.2 F (36.8 C)  TempSrc: Oral  SpO2: 100%  Weight: 113 lb 9.6 oz (51.5 kg)  Height: 5' (1.524 m)    This visit occurred during the SARS-CoV-2 public health emergency.  Safety protocols were in place, including screening questions prior to the visit, additional usage of staff PPE, and extensive cleaning of exam room while observing appropriate contact time as indicated for disinfecting solutions.   Assessment & Plan:

## 2020-11-04 NOTE — Patient Instructions (Signed)
We will check the blood flow of the legs to see what is causing the color change.

## 2020-11-10 ENCOUNTER — Other Ambulatory Visit: Payer: Self-pay | Admitting: Internal Medicine

## 2020-11-10 DIAGNOSIS — R230 Cyanosis: Secondary | ICD-10-CM

## 2020-11-16 ENCOUNTER — Telehealth: Payer: Self-pay | Admitting: Pulmonary Disease

## 2020-11-16 MED ORDER — ANORO ELLIPTA 62.5-25 MCG/INH IN AEPB: 1.0000 | INHALATION_SPRAY | Freq: Every day | RESPIRATORY_TRACT | 5 refills | Status: DC

## 2020-11-16 NOTE — Telephone Encounter (Signed)
Rx for anoro has been sent to preferred pharmacy for pt. Called and spoke with pt letting her know this had been done and she verbalized understanding. Nothing further needed.

## 2020-11-18 ENCOUNTER — Other Ambulatory Visit: Payer: Self-pay

## 2020-11-18 ENCOUNTER — Telehealth: Payer: Self-pay | Admitting: Pulmonary Disease

## 2020-11-18 ENCOUNTER — Ambulatory Visit (HOSPITAL_COMMUNITY)
Admission: RE | Admit: 2020-11-18 | Discharge: 2020-11-18 | Disposition: A | Discharge status: Home / Self Care | Payer: Medicare HMO | Source: Ambulatory Visit | Attending: Cardiology | Admitting: Cardiology

## 2020-11-18 DIAGNOSIS — E1159 Type 2 diabetes mellitus with other circulatory complications: Secondary | ICD-10-CM | POA: Diagnosis not present

## 2020-11-18 DIAGNOSIS — R230 Cyanosis: Secondary | ICD-10-CM | POA: Diagnosis not present

## 2020-11-18 NOTE — Telephone Encounter (Signed)
Spoke with pt who stated that after vacuuming and sweeping yesterday she was unable to take a deep breath. Pt then used 2 puffs of Albuterol rescue inhaler which provided. Pt states she felt much better now. Education on rescue inhalers and maintaince  inhalers was reviewed with pt and pt informed to notify office if SOB returns and is not helped with use of rescue inhaler.Pt did state that she had O2 available if she felt she needed to use it. Pt stated understanding. Forwarding to Dr. Loanne Drilling as Juluis [place]ier

## 2020-11-21 ENCOUNTER — Encounter: Payer: Self-pay | Admitting: Pulmonary Disease

## 2020-11-24 ENCOUNTER — Other Ambulatory Visit: Payer: Self-pay | Admitting: Internal Medicine

## 2020-11-24 DIAGNOSIS — I739 Peripheral vascular disease, unspecified: Secondary | ICD-10-CM

## 2020-11-25 DIAGNOSIS — J449 Chronic obstructive pulmonary disease, unspecified: Secondary | ICD-10-CM | POA: Diagnosis not present

## 2020-11-25 DIAGNOSIS — J189 Pneumonia, unspecified organism: Secondary | ICD-10-CM | POA: Diagnosis not present

## 2020-11-29 DIAGNOSIS — H0102B Squamous blepharitis left eye, upper and lower eyelids: Secondary | ICD-10-CM | POA: Diagnosis not present

## 2020-11-29 DIAGNOSIS — H04123 Dry eye syndrome of bilateral lacrimal glands: Secondary | ICD-10-CM | POA: Diagnosis not present

## 2020-11-29 DIAGNOSIS — H0102A Squamous blepharitis right eye, upper and lower eyelids: Secondary | ICD-10-CM | POA: Diagnosis not present

## 2020-11-30 ENCOUNTER — Other Ambulatory Visit: Payer: Self-pay | Admitting: Internal Medicine

## 2020-12-04 DIAGNOSIS — J449 Chronic obstructive pulmonary disease, unspecified: Secondary | ICD-10-CM | POA: Diagnosis not present

## 2020-12-04 DIAGNOSIS — J189 Pneumonia, unspecified organism: Secondary | ICD-10-CM | POA: Diagnosis not present

## 2020-12-05 ENCOUNTER — Other Ambulatory Visit: Payer: Self-pay

## 2020-12-05 ENCOUNTER — Ambulatory Visit: Payer: Medicare HMO | Admitting: Physician Assistant

## 2020-12-05 VITALS — BP 123/72 | HR 88 | Temp 98.3°F | Resp 20 | Ht 60.0 in | Wt 114.7 lb

## 2020-12-05 DIAGNOSIS — I739 Peripheral vascular disease, unspecified: Secondary | ICD-10-CM

## 2020-12-05 DIAGNOSIS — I75023 Atheroembolism of bilateral lower extremities: Secondary | ICD-10-CM | POA: Diagnosis not present

## 2020-12-05 NOTE — Progress Notes (Signed)
[facility] [place] HISTORY AND PHYSICAL   History of Present Illness:  Patient is a 80 y.o. year old female who presents for evaluation of PAD.  She was last seen by Dr. Trula Slade 05/12/2018 and at that time  her toes began to turn a charcoal color.  This is not associated with any pain.  Her walking is limited by her gait instability.  She does use a rolling walker.  She has a history of stroke which affected the right side of her body.  She had palpable PT pulses B LE.    She is here today for a f/u visit.  She continues to have bluish toe discoloration in a dependent position.  She denise rest pain, non healing wounds or claudication symptoms.  She does currently have plantarfasitis with heel pain on the right.       Past Medical History:  Diagnosis Date  . 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 [name]   . Cataract   . Chronic kidney disease    kidney stone  . Colitis   . Colon cancer ([place]) 06/2017   cecum  . Diabetes mellitus without complication ([place])    no meds taken  . Emphysema of lung ([place])   . GERD (gastroesophageal reflux disease)   . Hyperlipidemia   . Hypertension   . Peripheral arterial disease ([place])   . [name]   . Stroke [facility])    November 2016- Thanksgiving night    Past Surgical History:  Procedure Laterality Date  . ABDOMINAL HYSTERECTOMY    . BALLOON DILATION N/A 12/23/2012   Procedure: BALLOON DILATION;  Surgeon: Inda Castle, MD;  Location: [facility];  Service: Endoscopy;  Laterality: N/A;  . BALLOON DILATION N/A 01/15/2013   Procedure: BALLOON DILATION;  Surgeon: Inda Castle, MD;  Location: [facility];  Service: Endoscopy;  Laterality: N/A;  . BALLOON DILATION N/A 07/22/2018   Procedure: BALLOON DILATION;  Surgeon: Mauri Pole, MD;  Location: [facility];  Service: Endoscopy;  Laterality: N/A;  . cataract     both eyes  . COLONOSCOPY    . COLONOSCOPY WITH PROPOFOL N/A 03/05/2017   Procedure:  COLONOSCOPY WITH PROPOFOL;  Surgeon: Mauri Pole, MD;  Location: [facility];  Service: Endoscopy;  Laterality: N/A;  . DENTAL RESTORATION/EXTRACTION WITH X-RAY    . ESOPHAGOGASTRODUODENOSCOPY N/A 12/23/2012   Procedure: ESOPHAGOGASTRODUODENOSCOPY (EGD);  Surgeon: Inda Castle, MD;  Location: [facility];  Service: Endoscopy;  Laterality: N/A;  . ESOPHAGOGASTRODUODENOSCOPY N/A 01/15/2013   Procedure: ESOPHAGOGASTRODUODENOSCOPY (EGD);  Surgeon: Inda Castle, MD;  Location: [facility];  Service: Endoscopy;  Laterality: N/A;  . ESOPHAGOGASTRODUODENOSCOPY (EGD) WITH PROPOFOL N/A 07/22/2018   Procedure: ESOPHAGOGASTRODUODENOSCOPY (EGD) WITH PROPOFOL;  Surgeon: Mauri Pole, MD;  Location: [facility];  Service: Endoscopy;  Laterality: N/A;  . TONSILLECTOMY      ROS:   General:  No weight loss, Fever, chills  HEENT: No recent headaches, no nasal bleeding, no visual changes, no sore throat  Neurologic: No dizziness, blackouts, seizures. No recent symptoms of stroke or mini- stroke. No recent episodes of slurred speech, or temporary blindness.  Cardiac: No recent episodes of chest pain/pressure, no shortness of breath at rest.  No shortness of breath with exertion.  Denies history of atrial fibrillation or irregular heartbeat  Vascular: No history of rest pain in feet.  No history of claudication.  No history of non-healing ulcer, No history of DVT   Pulmonary:  No home oxygen, no productive cough, no hemoptysis,  No asthma or wheezing  Musculoskeletal:  [ x] Arthritis, [ ]  Low back pain,  [ ]  Joint pain  Hematologic:No history of hypercoagulable state.  No history of easy bleeding.  No history of anemia  Gastrointestinal: No hematochezia or melena,  No gastroesophageal reflux, no trouble swallowing  Urinary: [ ]  chronic Kidney disease, [ ]  on HD - [ ]  MWF or [ ]  TTHS, [ ]  Burning with urination, [ ]  Frequent urination, [ ]  Difficulty urinating;   Skin: No  rashes  Psychological: No history of anxiety,  No history of depression  Social History Social History   Tobacco Use  . Smoking status: Former Smoker    Packs/day: 1.00    Years: 55.00    Pack years: 55.00    Types: Cigarettes    Quit date: 10/19/2002    Years since quitting: 18.1  . Smokeless tobacco: Never Used  Vaping Use  . Vaping Use: Never used  Substance Use Topics  . Alcohol use: No    Alcohol/week: 0.0 standard drinks  . Drug use: No    Family History Family History  Problem Relation Age of Onset  . Hypertension Father   . Diabetes Father   . Cancer Father        melanoma  . Emphysema Mother   . Colon cancer Sister 22  . Cancer Sister        colon, surgery alone   . Emphysema Sister   . Early death Neg Hx   . Stroke Neg Hx   . Esophageal cancer Neg Hx   . Rectal cancer Neg Hx   . Stomach cancer Neg Hx     Allergies  Allergies  Allergen Reactions  . Lipitor [Atorvastatin] Itching     Current Outpatient Medications  Medication Sig Dispense Refill  . acetaminophen (TYLENOL) 500 MG tablet Take 500 mg by mouth daily.     Marland Kitchen albuterol (VENTOLIN HFA) 108 (90 Base) MCG/ACT inhaler Inhale 1-2 puffs into the lungs every 4 (four) hours as needed for wheezing or shortness of breath. 6.7 g 6  . aspirin EC 81 MG tablet Take 1 tablet (81 mg total) by mouth daily. 90 tablet 3  . clotrimazole (MYCELEX) 10 MG troche SMARTSIG:1 Lozenge(s) By Mouth Daily    . gabapentin (NEURONTIN) 300 MG capsule TAKE 1 CAPSULE BY MOUTH THREE TIMES DAILY 90 capsule 6  . ipratropium-albuterol (DUONEB) 0.5-2.5 (3) MG/3ML SOLN Take 3 mLs by nebulization every 4 (four) hours as needed. 360 mL 6  . omeprazole (PRILOSEC) 40 MG capsule Take 1 capsule (40 mg total) by mouth daily. 90 capsule 3  . pravastatin (PRAVACHOL) 20 MG tablet Take 1 tablet (20 mg total) by mouth daily. 90 tablet 3  . traMADol (ULTRAM) 50 MG tablet TAKE 1 TABLET BY MOUTH ONCE DAILY AS NEEDED 30 tablet 4  .  umeclidinium-vilanterol (ANORO ELLIPTA) 62.5-25 MCG/INH AEPB Inhale 1 puff into the lungs daily. 60 each 5   No current facility-administered medications for this visit.    Physical Examination  Vitals:   12/05/20 0906  BP: 123/72  Pulse: 88  Resp: 20  Temp: 98.3 F (36.8 C)  TempSrc: Temporal  SpO2: 95%  Weight: 114 lb 11.2 oz (52 kg)  Height: 5' (1.524 m)    Body mass index is 22.4 kg/m.  General:  Alert and oriented, no acute distress HEENT: Normal Neck: No bruit or JVD Pulmonary: Clear to auscultation bilaterally Cardiac:  Regular Rate and Rhythm without murmur Abdomen: Soft, non-tender, non-distended, no mass, no scars Skin: No rash Extremity Pulses:   radial, brachial, femoral, posterior tibial pulses bilaterally Musculoskeletal: No deformity or edema  Neurologic: Upper and lower extremity motor 5/5 and symmetric  DATA:    ABI Findings:  +---------+------------------+-----+----------+------------------------+  Right  Rt Pressure (mmHg)IndexWaveform Comment           +---------+------------------+-----+----------+------------------------+  Brachial 143                              +---------+------------------+-----+----------+------------------------+  ATA   165        1.15 biphasic difficult to hear/record  +---------+------------------+-----+----------+------------------------+  PTA   178        1.24 triphasic               +---------+------------------+-----+----------+------------------------+  PERO   148        1.03 monophasicsounded multiphasic     +---------+------------------+-----+----------+------------------------+  Great Toe71        0.50 Abnormal               +---------+------------------+-----+----------+------------------------+   +---------+------------------+-----+---------+-------+  Left   Lt Pressure  (mmHg)IndexWaveform Comment  +---------+------------------+-----+---------+-------+  Brachial 139                      +---------+------------------+-----+---------+-------+  ATA   166        1.16 biphasic       +---------+------------------+-----+---------+-------+  PTA   168        1.17 triphasic      +---------+------------------+-----+---------+-------+  PERO   147        1.03 biphasic       +---------+------------------+-----+---------+-------+  Great Toe0         0.00 Absent        +---------+------------------+-----+---------+-------+   +-------+-----------+-----------+------------+------------+  ABI/TBIToday's ABIToday's TBIPrevious ABIPrevious TBI  +-------+-----------+-----------+------------+------------+  Right 1.24    0.50    0.93    0.46      +-------+-----------+-----------+------------+------------+  Left  1.17    0     1.03    0.44      +-------+-----------+-----------+------------+------------+    Right ABIs appear increased compared to prior study on 05/12/18. Left TBIs  appear decreased compared to prior study on 05/12/18.    Summary:  Right: Resting right ankle-brachial index is within normal range. No  evidence of significant right lower extremity arterial disease. The right  toe-brachial index is abnormal. RT great toe pressure = 71 mmHg.   Left: Resting left ankle-brachial index is within normal range. No  evidence of significant left lower extremity arterial disease. The left  toe-brachial index is abnormal. LT Great toe pressure = 0 mmHg.   ASSESSMENT:  Her ABI's are essentially unchanged the left GT pressure was recorded as 0. Elevated index demonstrates calcification of the vessels.   The bluish discoloration maybe a sign of microvascular PAD with her history of DM.   She continues to have palpable PT  pulses B with no evidence of non healing wounds.   She denise pain in her toes.    PLAN: She will f/u for repeat ABI's in 1 year.  We reviewed the signs and symptoms of ischemia to include rest pain, non healing wounds and claudication if these occur she will call us, other wise she will f/u in 1 year.  Cont. Daily Asa and Statin.     Roxy Horseman PA-C [facility] [place] Office: [phone]  MD in clinic [place]

## 2020-12-06 ENCOUNTER — Telehealth: Payer: Self-pay

## 2020-12-06 NOTE — Progress Notes (Signed)
Chronic Care Management Pharmacy Assistant   Name: Sherry Lambert  MRN: [id number] DOB: Jun 29, 1941  Reason for Encounter: Initial Call Questions    Recent office visits:  11/04/20 Sherry Lambert - PCP  Recent consult visits:  12/05/20 Sherry Lambert - Vascular Surgeon 11/18/20 Sherry Lambert visits:  Medication Reconciliation was completed by comparing discharge summary, patient's EMR and Pharmacy list, and upon discussion with patient.  Admitted to the [facility] on 11-18-20 due to Sherry Lambert. Discharge date was 11-18-20. Discharged from Sherry Lambert Discharge:?? None stated.  Medication Changes at [facility] Discharge: None stated.  Medications Discontinued at [facility] Discharge: None stated.  Medications that remain the same after [facility] Discharge:??  -All other medications will remain the same.    Medications: Outpatient Encounter Medications as of 12/06/2020  Medication Sig  . acetaminophen (TYLENOL) 500 MG tablet Take 500 mg by mouth daily.   Marland Kitchen albuterol (VENTOLIN HFA) 108 (90 Base) MCG/ACT inhaler Inhale 1-2 puffs into the lungs every 4 (four) hours as needed for wheezing or shortness of breath.  Marland Kitchen aspirin EC 81 MG tablet Take 1 tablet (81 mg total) by mouth daily.  . clotrimazole (MYCELEX) 10 MG troche SMARTSIG:1 Lozenge(s) By Mouth Daily  . gabapentin (NEURONTIN) 300 MG capsule TAKE 1 CAPSULE BY MOUTH THREE TIMES DAILY  . ipratropium-albuterol (DUONEB) 0.5-2.5 (3) MG/3ML SOLN Take 3 mLs by nebulization every 4 (four) hours as needed.  Marland Kitchen omeprazole (PRILOSEC) 40 MG capsule Take 1 capsule (40 mg total) by mouth daily.  . pravastatin (PRAVACHOL) 20 MG tablet Take 1 tablet (20 mg total) by mouth daily.  . traMADol (ULTRAM) 50 MG tablet TAKE 1 TABLET BY MOUTH ONCE DAILY AS NEEDED  . umeclidinium-vilanterol (ANORO ELLIPTA) 62.5-25 MCG/INH AEPB Inhale 1 puff into the lungs daily.   No facility-administered  encounter medications on file as of 12/06/2020.    Have you seen any other providers since your last visit? Patient stated she seen Sherry Heman, MD and Sherry Slate, PA-C since seeing PCP.  Any changes in your medications or health? Patient stated that she has thrush and she thinks it came from a steroid that's in one of her inhalers so her provider changed the inhaler and she's doing better.  Any side effects from any medications? Patient states she continues to develop thrush and believes it is from her inhalers.  Do you have an symptoms or problems not managed by your medications? Patient stated no problems.  Any concerns about your health right now? Patient stated she still has thrush after changing inhalers.  Has your provider asked that you check blood pressure, blood sugar, or follow special diet at home? Patient stated she checks her blood pressure occasionally and it runs similar to her doctor's visits.  Do you get any type of exercise on a regular basis? Patient stated she would love to exercise but she walks with a walker.  Can you think of a goal you would like to reach for your health? Patient stated she would love to walk without a walker and to drive again.  Do you have any problems getting your medications? Patient stated no problems getting medications.  Is there anything that you would like to discuss during the appointment? Patient stated not at this time.  Please bring medications and supplements to appointment.   Star Rating Drugs: Pravastatin 20 mg 10/20/20 [place], [place] Clinical Pharmacists Assistant [phone]  Time spent: 72 mins

## 2020-12-07 ENCOUNTER — Other Ambulatory Visit: Payer: Self-pay

## 2020-12-07 ENCOUNTER — Ambulatory Visit (INDEPENDENT_AMBULATORY_CARE_PROVIDER_SITE_OTHER): Payer: Medicare HMO | Admitting: Pharmacist

## 2020-12-07 DIAGNOSIS — I10 Essential (primary) hypertension: Secondary | ICD-10-CM | POA: Diagnosis not present

## 2020-12-07 DIAGNOSIS — E785 Hyperlipidemia, unspecified: Secondary | ICD-10-CM | POA: Diagnosis not present

## 2020-12-07 DIAGNOSIS — I69398 Other sequelae of cerebral infarction: Secondary | ICD-10-CM

## 2020-12-07 DIAGNOSIS — J449 Chronic obstructive pulmonary disease, unspecified: Secondary | ICD-10-CM | POA: Diagnosis not present

## 2020-12-07 DIAGNOSIS — B370 Candidal stomatitis: Secondary | ICD-10-CM

## 2020-12-07 DIAGNOSIS — E1169 Type 2 diabetes mellitus with other specified complication: Secondary | ICD-10-CM | POA: Diagnosis not present

## 2020-12-07 DIAGNOSIS — I739 Peripheral vascular disease, unspecified: Secondary | ICD-10-CM

## 2020-12-07 NOTE — Patient Instructions (Signed)
Visit Information  Phone number for Pharmacist: [phone]  Thank you for meeting with me to discuss your medications! I look forward to working with you to achieve your health care goals. Below is a summary of what we talked about during the visit:  Goals Addressed            This Visit's Progress   . Manage My Medicine       Timeframe:  Long-Range Goal Priority:  High Start Date:   12/07/20                          Expected End Date:        06/08/21               Follow Up Date 04/01/21    - call for medicine refill 2 or 3 days before it runs out - call if I am sick and can't take my medicine - keep a list of all the medicines I take; vitamins and herbals too   -Try Vitamin B Complex for nerves -Try probiotic for thrush -Try capsaicin cream for nerve pain -Increase Tylenol to 2 tablets (1000 mg) twice a day -Trial off of gabapentin to see if it is helping or not  Why is this important?   . These steps will help you keep on track with your medicines.   Notes:       Patient Care Plan: CCM Pharmacy Care Plan    Problem Identified: Hypertension, Hyperlipidemia, Diabetes, COPD and Osteoarthritis, Thrush, Neuropathy   Priority: High    Long-Range Goal: Disease management   Start Date: 12/07/2020  Expected End Date: 06/08/2021  This Visit's Progress: On track  Priority: High  Note:   Current Barriers:  . Unable to independently monitor therapeutic efficacy . Unable to achieve control of thrush, nerve pain   Pharmacist Clinical Goal(s):  Marland Kitchen Patient will achieve adherence to monitoring guidelines and medication adherence to achieve therapeutic efficacy . achieve control of thrush, nerve pain as evidenced by patient report through collaboration with PharmD and provider.   Interventions: . 1:1 collaboration with [name], MD regarding development and update of comprehensive plan of care as evidenced by provider attestation and co-signature . Inter-disciplinary  care team collaboration (see longitudinal plan of care) . Comprehensive medication review performed; medication list updated in electronic medical record  Hypertension (BP goal <130/80) -Controlled without medication -Medications previously tried: lisinopril, furosemide,   -Current home readings: 117/80 -Denies hypotensive/hypertensive symptoms -Educated on BP goals and benefits of medications for prevention of heart attack, stroke and kidney damage; Daily salt intake goal < 2300 mg; -Counseled to monitor BP at home weekly, document, and provide log at future appointments -Counseled on diet and exercise extensively  Hyperlipidemia: (LDL goal < 70) -Controlled - LDL at goal, pt endorses compliance with statin and denies side effects -hx PAD, CVA -Current treatment: . Pravastatin 20 mg daily AM . Aspirin 81 mg daily AM -Current exercise habits: walking, exercises for blood flow -Educated on Cholesterol goals;  Benefits of statin for ASCVD risk reduction; -Recommended to continue current medication  Diabetes (A1c goal <7%) -Diet-controlled -Medications previously tried: none -Denies hypoglycemic/hyperglycemic symptoms -Educated on A1c and blood sugar goals; -Counseled to check feet daily and get yearly eye exams -Counseled on diet and exercise extensively  COPD (Goal: control symptoms and prevent exacerbations) -Controlled - pt reports breathing is the same after switching Trelegy to [facility]; she still has issue with  thrush (see below) -Current treatment  . Anoro Ellipta (umeclidinium-vilanterol) 1 puff daily . Duoneb q4h PRN . Albuterol HFA prn -Medications previously tried: Trelegy -Gold Grade: Gold 2 (FEV1 50-79%) -Current COPD Classification:  B (high sx, <2 exacerbations/yr) -MMRC/CAT score: not on file -Pulmonary function testing: 11/01/20- FEV1 50% predicted, FEV1/FVC 0.46 -Exacerbations requiring treatment in last 6 months: 0 -Patient reports consistent use of maintenance  inhaler -Frequency of rescue inhaler use: 1-2x per week -Counseled on Proper inhaler technique; When to use rescue inhaler -Recommended to continue current medication  Thrush (Goal: eliminate thrush) -Uncontrolled - pt is no longer taking inhaled steroid and thrush has not gone away; she reports she has had thrush for a year, it comes and goes; it was resolved with nystatin and fluconazole, but returns after stopping antifungal treatment. Pt is using listerine and clotrimazole troches currently -Current treatment  . Clotrimazole 10 mg troche -Counseled on causes of thrush including ICS and antibiotics that get rid of good bacteria as well -Recommend probiotic to restore good bacteria in GI tract/mouth which may help with thrush. If no improvement, f/u with PCP and/or GI  Sensory alteration s/p CVA (Goal: improve symptoms) -Not ideally controlled -pt is not sure if gabapentin is helping with nerve issues. She takes tramadol most days and Tylenol 500 mg once a day. -Current treatment  . Gabapentin 300 mg TID . Tramadol 50 mg daily most days . Tylenol 500 mg PRN -Recommend trial off gabapentin to see if it is helping - taper down by 1 pill every few days. Restart if symptoms worsen, may consider increasing dose at next PCP as well -Increase Tylenol to 1000 mg BID -Recommend capsaicin cream for nerve pain (arm) -Recommend Vitamin B complex for overall nerve health  Osteopenia (Goal prevent fractures) -Unknown control - no DEXA on file.  -Current treatment  . Vitamin D 1000 IU daily -Recommend [phone] units of vitamin D daily. Recommend 1200 mg of calcium daily from dietary and supplemental sources. -Recommended to continue current medication  GERD / esophageal dysphagia (Goal: manage symptoms) -Controlled -Current treatment  . Omeprazole 40 mg daily -Patient is satisfied with current regimen and denies issues -Recommended to continue current medication  Health Maintenance -Vaccine  gaps: Covid booster -Current therapy:   Marland Kitchen Vitamin D . Refresh eye drops . Benefiber -Patient is satisfied with current therapy and denies issues -Recommended to continue current medication  Patient Goals/Self-Care Activities . Patient will:  - take medications as prescribed focus on medication adherence by pill box  -Try Vitamin B Complex for nerves -Try probiotic for thrush -Try capsaicin cream for nerve pain -Increase Tylenol to 2 tablets (1000 mg) twice a day -Trial off of gabapentin to see if it is helping or not  Follow Up Plan: Telephone follow up appointment with care management team member scheduled for: 3 months      [name] was given information about Chronic Care Management services today including:  1. CCM service includes personalized support from designated clinical staff supervised by her physician, including individualized plan of care and coordination with other care providers 2. 24/7 contact phone numbers for assistance for urgent and routine care needs. 3. Standard insurance, coinsurance, copays and deductibles apply for chronic care management only during months in which we provide at least 20 minutes of these services. Most insurances cover these services at 100%, however patients may be responsible for any copay, coinsurance and/or deductible if applicable. This service may help you avoid the need for more  expensive face-to-face services. 4. Only one practitioner may furnish and bill the service in a calendar month. 5. The patient may stop CCM services at any time (effective at the end of the month) by phone call to the office staff.  Patient agreed to services and verbal consent obtained.   The patient verbalized understanding of instructions, educational materials, and care plan provided today and declined offer to receive copy of patient instructions, educational materials, and care plan.  Telephone follow up appointment with pharmacy team member scheduled  for: 3 months  Charlene Brooke, PharmD, [place], CPP Clinical Pharmacist [facility] [phone]

## 2020-12-07 NOTE — Progress Notes (Cosign Needed)
Chronic Care Management Pharmacy Note  12/07/2020 Name:  Sherry Lambert MRN:  [id number] DOB:  11/09/1940  Subjective: Sherry Lambert is an 80 y.o. year old female who is a primary patient of [name], MD.  The CCM team was consulted for assistance with disease management and care coordination needs.    Engaged with patient face to face for initial visit in response to provider referral for pharmacy case management and/or care coordination services.   Consent to Services:  The patient was given the following information about Chronic Care Management services today, agreed to services, and gave verbal consent: 1. CCM service includes personalized support from designated clinical staff supervised by the primary care provider, including individualized plan of care and coordination with other care providers 2. 24/7 contact phone numbers for assistance for urgent and routine care needs. 3. Service will only be billed when office clinical staff spend 20 minutes or more in a month to coordinate care. 4. Only one practitioner may furnish and bill the service in a calendar month. 5.The patient may stop CCM services at any time (effective at the end of the month) by phone call to the office staff. 6. The patient will be responsible for cost sharing (co-pay) of up to 20% of the service fee (after annual deductible is met). Patient agreed to services and consent obtained.  Patient Care Team: [name], MD as PCP - General (Internal Medicine) Charlton Haws, [facility] as Pharmacist (Pharmacist)  Recent office visits: 11/04/20 Dr [name] OV: acute visit for blue toes w/ pain. Repeat ABI.  07/26/20 Dr [name] OV: acute visit for UTI, rx'd macrobid.  Recent consult visits: 12/05/20 PA [name] (VVS): ABI unchanged, blue color may be microvascular PAD d/t DM. Continue ASA and statin.  11/01/20 Dr Loanne Drilling (pulmonary): Dx COPD 2016, has been on Trelegy. Last exacerbation Feb 2021 w/  hospitalization. Thrush resolved w/ mouth rinsin and chronic fluconazole. New PFTs c/w emphysema. Switched Trelegy to [employer], stop fluconazole after 1 week.  Hospital visits: None in previous 6 months  Objective:  Lab Results  Component Value Date   CREATININE 0.87 07/13/2020   BUN 11 07/13/2020   GFR 63.96 07/13/2020   GFRNONAA >60 01/16/2020   GFRAA >60 01/16/2020   NA 141 07/13/2020   K 4.9 07/13/2020   CALCIUM 9.8 07/13/2020   CO2 30 07/13/2020   GLUCOSE 108 (H) 07/13/2020    Lab Results  Component Value Date/Time   HGBA1C 6.7 (H) 07/13/2020 10:35 AM   HGBA1C 6.5 (H) 10/27/2019 07:23 PM   GFR 63.96 07/13/2020 10:35 AM   GFR 49.72 (L) 06/18/2019 08:30 AM   MICROALBUR 1.1 06/18/2019 08:37 AM   MICROALBUR 2.7 (H) 06/05/2018 11:19 AM    Last diabetic Eye exam: No results found for: HMDIABEYEEXA  Last diabetic Foot exam: No results found for: HMDIABFOOTEX   Lab Results  Component Value Date   CHOL 162 07/13/2020   HDL 75.50 07/13/2020   LDLCALC 71 07/13/2020   LDLDIRECT 122.4 09/15/2013   TRIG 78.0 07/13/2020   CHOLHDL 2 07/13/2020    Hepatic Function Latest Ref Rng & Units 07/13/2020 01/15/2020 01/14/2020  Total Protein 6.0 - 8.3 g/dL 6.9 6.1(L) 7.7  Albumin 3.5 - 5.2 g/dL 4.1 3.3(L) 4.1  AST 0 - 37 U/L '19 15 24  ' ALT 0 - 35 U/L '15 14 16  ' Alk Phosphatase 39 - 117 U/L 68 61 77  Total Bilirubin 0.2 - 1.2 mg/dL 0.3 1.0 0.9  Bilirubin, Direct 0.0 - 0.3 mg/dL - - -    Lab Results  Component Value Date/Time   TSH 1.28 11/04/2012 11:45 AM    CBC Latest Ref Rng & Units 01/16/2020 01/15/2020 01/14/2020  WBC 4.0 - 10.5 K/uL 10.2 13.9(H) 27.0(H)  Hemoglobin 12.0 - 15.0 g/dL 12.2 12.6 15.1(H)  Hematocrit 36.0 - 46.0 % 39.3 40.7 48.1(H)  Platelets 150 - 400 K/uL 211 204 262    No results found for: VD25OH  Clinical ASCVD: Yes  The 10-year ASCVD risk score Mikey Bussing DC Jr., et al., 2013) is: 37.1%   Values used to calculate the score:     Age: 15 years     Sex: Female      Is Non-Hispanic African American: No     Diabetic: Yes     Tobacco smoker: No     Systolic Blood Pressure: 697 mmHg     Is BP treated: No     HDL Cholesterol: 75.5 mg/dL     Total Cholesterol: 162 mg/dL    Depression screen [facility] 2/9 06/23/2020 11/05/2019 09/15/2018  Decreased Interest 0 0 0  Down, Depressed, Hopeless 0 0 0  PHQ - 2 Score 0 0 0  Some recent data might be hidden     Social History   Tobacco Use  Smoking Status Former Smoker  . Packs/day: 1.00  . Years: 55.00  . Pack years: 55.00  . Types: Cigarettes  . Quit date: 10/19/2002  . Years since quitting: 18.1  Smokeless Tobacco Never Used   BP Readings from Last 3 Encounters:  12/05/20 123/72  11/04/20 122/90  11/01/20 (!) 142/78   Pulse Readings from Last 3 Encounters:  12/05/20 88  11/04/20 78  11/01/20 (!) 102   Wt Readings from Last 3 Encounters:  12/05/20 114 lb 11.2 oz (52 kg)  11/04/20 113 lb 9.6 oz (51.5 kg)  11/01/20 114 lb (51.7 kg)   BMI Readings from Last 3 Encounters:  12/05/20 22.40 kg/m  11/04/20 22.19 kg/m  11/01/20 22.26 kg/m    Assessment/Interventions: Review of patient past medical history, allergies, medications, health status, including review of consultants reports, laboratory and other test data, was performed as part of comprehensive evaluation and provision of chronic care management services.   SDOH:  (Social Determinants of Health) assessments and interventions performed: Yes  SDOH Screenings   Alcohol Screen: Low Risk   . Last Alcohol Screening Score (AUDIT): 0  Depression (PHQ2-9): Low Risk   . PHQ-2 Score: 0  Financial Resource Strain: Low Risk   . Difficulty of Paying Living Expenses: Not hard at all  Food Insecurity: No Food Insecurity  . Worried About Charity fundraiser in the Last Year: Never true  . Ran Out of Food in the Last Year: Never true  Housing: Low Risk   . Last Housing Risk Score: 0  Physical Activity: Inactive  . Days of Exercise per Week: 0  days  . Minutes of Exercise per Session: 0 min  Social Connections: Socially Isolated  . Frequency of Communication with Friends and Family: More than three times a week  . Frequency of Social Gatherings with Friends and Family: More than three times a week  . Attends Religious Services: Never  . Active Member of Clubs or Organizations: No  . Attends Archivist Meetings: Never  . Marital Status: Widowed  Stress: No Stress Concern Present  . Feeling of Stress : Not at all  Tobacco Use: Medium Risk  . Smoking  Tobacco Use: Former Smoker  . Smokeless Tobacco Use: Never Used  Transportation Needs: No Transportation Needs  . Lack of Transportation (Medical): No  . Lack of Transportation (Non-Medical): No    CCM Care Plan  Allergies  Allergen Reactions  . Lipitor [Atorvastatin] Itching    Medications Reviewed Today    Reviewed by Charlton Haws, [facility] (Pharmacist) on 12/07/20 at 47  Med List Status: <None>  Medication Order Taking? Sig Documenting Provider Last Dose Status Informant  acetaminophen (TYLENOL) 500 MG tablet [id number] Yes Take 500 mg by mouth daily.  [provider] Taking Active Self           Med Note Broadus John, REGEENA   Thu Jan 14, 2020  2:19 PM)    albuterol (VENTOLIN HFA) 108 (90 Base) MCG/ACT inhaler [id number] Yes Inhale 1-2 puffs into the lungs every 4 (four) hours as needed for wheezing or shortness of breath. [name], MD Taking Active Self  aspirin EC 81 MG tablet [id number] Yes Take 1 tablet (81 mg total) by mouth daily. Rosalin Hawking, MD Taking Active Self  b complex vitamins capsule [id number] Yes Take 1 capsule by mouth daily. [provider] Taking Active   capsaicin (ZOSTRIX) 0.025 % cream [id number] Yes Apply topically 2 (two) times daily. [provider] Taking Active   cholecalciferol (VITAMIN D3) 25 MCG (1000 UNIT) tablet [id number] Yes Take 1,000 Units by mouth daily. [provider] Taking Active    clotrimazole (MYCELEX) 10 MG troche [id number] Yes SMARTSIG:1 Lozenge(s) By Mouth Daily [provider] Taking Active   gabapentin (NEURONTIN) 300 MG capsule [id number] Yes TAKE 1 CAPSULE BY MOUTH THREE TIMES DAILY [name], MD Taking Active   ipratropium-albuterol (DUONEB) 0.5-2.5 (3) MG/3ML SOLN [id number] Yes Take 3 mLs by nebulization every 4 (four) hours as needed. Georgette Shell, MD Taking Active   Lactobacillus (PROBIOTIC ACIDOPHILUS PO) [id number] Yes Take 1 capsule by mouth daily. [provider] Taking Active   omeprazole (PRILOSEC) 40 MG capsule [id number] Yes Take 1 capsule (40 mg total) by mouth daily. [name], MD Taking Active   pravastatin (PRAVACHOL) 20 MG tablet [id number] Yes Take 1 tablet (20 mg total) by mouth daily. [name], MD Taking Active   psyllium (METAMUCIL) 58.6 % packet [id number] Yes Take 1 packet by mouth daily. [provider] Taking Active   traMADol (ULTRAM) 50 MG tablet [id number] Yes TAKE 1 TABLET BY MOUTH ONCE DAILY AS NEEDED [name], MD Taking Active   umeclidinium-vilanterol [facility] ELLIPTA) 62.5-25 MCG/INH AEPB [id number] Yes Inhale 1 puff into the lungs daily. [name], MD Taking Active           Patient Active Problem List   Diagnosis Date Noted  . Blue toes 11/04/2020  . Urinary retention 07/26/2020  . Thrush 02/26/2020  . COPD, severe ([place]) 11/06/2019  . Muscle cramps 07/07/2019  . Esophageal dysphagia   . Venous insufficiency 11/29/2017  . Urinary incontinence 11/29/2017  . 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 Cecum mass   . Mass in rectum   . PVD (peripheral vascular disease) ([place]) 09/14/2016  . Essential hypertension 12/20/2014  . Routine general medical examination at a health care facility 10/22/2014  . 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 Peripheral arterial disease ([place])  01/05/2014  . Osteopenia 11/12/2013  . Hyperlipidemia associated with type 2 diabetes mellitus ([place]) 09/19/2013  . Esophageal stricture 12/23/2012  .  Diabetes mellitus type 2, controlled, 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[place]) 74/94/4967    Immunization History  Administered Date(s) Administered  . Fluad Quad(high Dose 65+) 06/18/2019, 07/13/2020  . Influenza, High Dose Seasonal PF 07/27/2013, 05/14/2016, 05/20/2017, 06/05/2018  . Influenza,inj,Quad PF,6+ Mos 08/03/2014, 06/21/2015  . Influenza,inj,quad, With Preservative 06/04/2019  . Influenza-Unspecified 07/04/2012  . PFIZER(Purple Top)SARS-COV-2 Vaccination 11/23/2019, 12/08/2019  . Pneumococcal Conjugate-13 11/11/2015  . Pneumococcal-Unspecified 09/03/2010  . Td 09/03/2012    Conditions to be addressed/monitored:  Hypertension, Hyperlipidemia, Diabetes, COPD and Osteoarthritis, Thrush, Neuropathy  Care Plan : [place]  Updates made by Charlton Haws, [place] since 12/07/2020 12:00 AM    Problem: Hypertension, Hyperlipidemia, Diabetes, COPD and Osteoarthritis, Thrush, Neuropathy   Priority: High    Long-Range Goal: Disease management   Start Date: 12/07/2020  Expected End Date: 06/08/2021  This Visit's Progress: On track  Priority: High  Note:   Current Barriers:  . Unable to independently monitor therapeutic efficacy . Unable to achieve control of thrush, nerve pain   Pharmacist Clinical Goal(s):  Marland Kitchen Patient will achieve adherence to monitoring guidelines and medication adherence to achieve therapeutic efficacy . achieve control of thrush, nerve pain as evidenced by patient report through collaboration with PharmD and provider.   Interventions: . 1:1 collaboration with [name], MD regarding development and update of comprehensive plan of care as evidenced by provider attestation and co-signature . Inter-disciplinary care team collaboration (see longitudinal plan of care) . Comprehensive medication review  performed; medication list updated in electronic medical record  Hypertension (BP goal <130/80) -Controlled without medication -Medications previously tried: lisinopril, furosemide,   -Current home readings: 117/80 -Denies hypotensive/hypertensive symptoms -Educated on BP goals and benefits of medications for prevention of heart attack, stroke and kidney damage; Daily salt intake goal < 2300 mg; -Counseled to monitor BP at home weekly, document, and provide log at future appointments -Counseled on diet and exercise extensively  Hyperlipidemia: (LDL goal < 70) -Controlled - LDL at goal, pt endorses compliance with statin and denies side effects -hx PAD, CVA -Current treatment: . Pravastatin 20 mg daily AM . Aspirin 81 mg daily AM -Current exercise habits: walking, exercises for blood flow -Educated on Cholesterol goals;  Benefits of statin for ASCVD risk reduction; -Recommended to continue current medication  Diabetes (A1c goal <7%) -Diet-controlled -Medications previously tried: none -Denies hypoglycemic/hyperglycemic symptoms -Educated on A1c and blood sugar goals; -Counseled to check feet daily and get yearly eye exams -Counseled on diet and exercise extensively  COPD (Goal: control symptoms and prevent exacerbations) -Controlled - pt reports breathing is the same after switching Trelegy to Anoro; she still has issue with thrush (see below) -Current treatment  . Anoro Ellipta (umeclidinium-vilanterol) 1 puff daily . Duoneb q4h PRN . Albuterol HFA prn -Medications previously tried: Trelegy -Gold Grade: Gold 2 (FEV1 50-79%) -Current COPD Classification:  B (high sx, <2 exacerbations/yr) -MMRC/CAT score: not on file -Pulmonary function testing: 11/01/20- FEV1 50% predicted, FEV1/FVC 0.46 -Exacerbations requiring treatment in last 6 months: 0 -Patient reports consistent use of maintenance inhaler -Frequency of rescue inhaler use: 1-2x per week -Counseled on Proper inhaler  technique; When to use rescue inhaler -Recommended to continue current medication  Thrush (Goal: eliminate thrush) -Uncontrolled - pt is no longer taking inhaled steroid and thrush has not gone away; she reports she has had thrush for a year, it comes and goes; it was resolved with nystatin and fluconazole, but returns after stopping antifungal treatment. Pt is using listerine and clotrimazole troches  currently -Current treatment  . Clotrimazole 10 mg troche -Counseled on causes of thrush including ICS and antibiotics that get rid of good bacteria as well -Recommend probiotic to restore good bacteria in GI tract/mouth which may help with thrush. If no improvement, f/u with PCP and/or GI  Sensory alteration s/p CVA (Goal: improve symptoms) -Not ideally controlled -pt is not sure if gabapentin is helping with nerve issues. She takes tramadol most days and Tylenol 500 mg once a day. -Current treatment  . Gabapentin 300 mg TID . Tramadol 50 mg daily most days . Tylenol 500 mg PRN -Recommend trial off gabapentin to see if it is helping - taper down by 1 pill every few days. Restart if symptoms worsen, may consider increasing dose at next PCP as well -Increase Tylenol to 1000 mg BID -Recommend capsaicin cream for nerve pain (arm) -Recommend Vitamin B complex for overall nerve health  Osteopenia (Goal prevent fractures) -Unknown control - no DEXA on file.  -Current treatment  . Vitamin D 1000 IU daily -Recommend [phone] units of vitamin D daily. Recommend 1200 mg of calcium daily from dietary and supplemental sources. -Recommended to continue current medication  GERD / esophageal dysphagia (Goal: manage symptoms) -Controlled -Current treatment  . Omeprazole 40 mg daily -Patient is satisfied with current regimen and denies issues -Recommended to continue current medication  Health Maintenance -Vaccine gaps: Covid booster -Current therapy:   Marland Kitchen Vitamin D . Refresh eye  drops . Benefiber -Patient is satisfied with current therapy and denies issues -Recommended to continue current medication  Patient Goals/Self-Care Activities . Patient will:  - take medications as prescribed focus on medication adherence by pill box  -Try Vitamin B Complex for nerves -Try probiotic for thrush -Try capsaicin cream for nerve pain -Increase Tylenol to 2 tablets (1000 mg) twice a day -Trial off of gabapentin to see if it is helping or not  Follow Up Plan: Telephone follow up appointment with care management team member scheduled for: 3 months      Medication Assistance: None required.  Patient affirms current coverage meets needs.  Patient's preferred pharmacy is:  [facility] [address], [place] [address] Phone: [phone] Fax: [phone]  Uses pill box? Yes Pt endorses 100% compliance  We discussed: Current pharmacy is preferred with insurance plan and patient is satisfied with pharmacy services Patient decided to: Continue current medication management strategy  Care Plan and Follow Up Patient Decision:  Patient agrees to Care Plan and Follow-up.  Plan: Telephone follow up appointment with care management team member scheduled for:  3 months  Charlene Brooke, PharmD, [place], CPP Clinical Pharmacist [facility] [phone]

## 2020-12-08 ENCOUNTER — Telehealth: Payer: Self-pay

## 2020-12-08 NOTE — Chronic Care Management (AMB) (Signed)
error 

## 2020-12-20 ENCOUNTER — Encounter: Payer: Self-pay | Admitting: Pulmonary Disease

## 2020-12-20 ENCOUNTER — Other Ambulatory Visit: Payer: Self-pay

## 2020-12-20 ENCOUNTER — Ambulatory Visit: Payer: Medicare HMO | Admitting: Pulmonary Disease

## 2020-12-20 VITALS — BP 118/72 | HR 78 | Temp 97.5°F | Ht 60.0 in | Wt 114.0 lb

## 2020-12-20 DIAGNOSIS — J449 Chronic obstructive pulmonary disease, unspecified: Secondary | ICD-10-CM | POA: Diagnosis not present

## 2020-12-20 DIAGNOSIS — J301 Allergic rhinitis due to pollen: Secondary | ICD-10-CM | POA: Diagnosis not present

## 2020-12-20 NOTE — Patient Instructions (Addendum)
COPD with emphysema - well controlled --CONTINUE Anoro. Call for refills if needed --CONTINUE Albuterol as needed  Allergic Rhinitis --Take Claritin or loratidine 10 mg daily --Recommend wearing when able during pollen season  Follow-up in 6 months

## 2020-12-20 NOTE — Progress Notes (Signed)
Synopsis: Referred in 2017 for COPD by [name], *. Formerly a patient of Dr. Murlean Iba.  Subjective:   PATIENT ID: Sherry Lambert GENDER: female DOB: 12-07-1940, MRN: [id number]  Chief Complaint  Patient presents with  . Follow-up    Allergies, Albuterol taking awhile to work   Sherry Lambert is a 80 year old female former smoker with COPD who presents for follow-up.   She is a former patient of Dr. Carlis Abbott. She was diagnosed with COPD in 2016. Her last COPD exacerbation was one year ago Feb 2021 requiring hospitalization. She transitioned from Trelegy to [facility] in March 2022 due to thrush and reports symptoms remain well-controlled.  Since our last visit, she reports her symptoms are well contolled on Anoro. [name] has resolved off inhaled corticosteroids. After sitting three hours outside, she used her Albuterol yesterday for shortness of breath and nasal congestion and believes this is related to the pollen. Otherwise she does routinely use her rescue inhaler. Denies wheezing or cough.  Social history: Quit in 2004. 55 pack-years  Past Medical History:  Diagnosis Date  . 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 [name]   . Cataract   . Chronic kidney disease    kidney stone  . Colitis   . Colon cancer ([place]) 06/2017   cecum  . Diabetes mellitus without complication ([place])    no meds taken  . Emphysema of lung ([place])   . GERD (gastroesophageal reflux disease)   . Hyperlipidemia   . Hypertension   . Peripheral arterial disease ([place])   . [name]   . Stroke [facility])    November 2016- Thanksgiving night     Allergies  Allergen Reactions  . Lipitor [Atorvastatin] Itching     Outpatient Medications Prior to Visit  Medication Sig Dispense Refill  . acetaminophen (TYLENOL) 500 MG tablet Take 500 mg by mouth daily.     Marland Kitchen albuterol (VENTOLIN HFA) 108 (90 Base) MCG/ACT inhaler Inhale 1-2 puffs into the lungs every 4 (four) hours as needed for  wheezing or shortness of breath. 6.7 g 6  . aspirin EC 81 MG tablet Take 1 tablet (81 mg total) by mouth daily. 90 tablet 3  . b complex vitamins capsule Take 1 capsule by mouth daily.    . capsaicin (ZOSTRIX) 0.025 % cream Apply topically 2 (two) times daily.    . cholecalciferol (VITAMIN D3) 25 MCG (1000 UNIT) tablet Take 1,000 Units by mouth daily.    . clotrimazole (MYCELEX) 10 MG troche SMARTSIG:1 Lozenge(s) By Mouth Daily    . gabapentin (NEURONTIN) 300 MG capsule TAKE 1 CAPSULE BY MOUTH THREE TIMES DAILY 90 capsule 6  . ipratropium-albuterol (DUONEB) 0.5-2.5 (3) MG/3ML SOLN Take 3 mLs by nebulization every 4 (four) hours as needed. 360 mL 6  . Lactobacillus (PROBIOTIC ACIDOPHILUS PO) Take 1 capsule by mouth daily.    Marland Kitchen omeprazole (PRILOSEC) 40 MG capsule Take 1 capsule (40 mg total) by mouth daily. 90 capsule 3  . pravastatin (PRAVACHOL) 20 MG tablet Take 1 tablet (20 mg total) by mouth daily. 90 tablet 3  . psyllium (METAMUCIL) 58.6 % packet Take 1 packet by mouth daily.    . traMADol (ULTRAM) 50 MG tablet TAKE 1 TABLET BY MOUTH ONCE DAILY AS NEEDED 30 tablet 4  . umeclidinium-vilanterol (ANORO ELLIPTA) 62.5-25 MCG/INH AEPB Inhale 1 puff into the lungs daily. 60 each 5   No facility-administered medications prior to visit.    Review of  Systems  Constitutional: Negative for chills, diaphoresis, fever, malaise/fatigue and weight loss.  HENT: Positive for congestion.   Respiratory: Positive for shortness of breath. Negative for cough, hemoptysis, sputum production and wheezing.   Cardiovascular: Negative for chest pain, palpitations and leg swelling.     Objective:   Vitals:   12/20/20 0939  BP: 118/72  Pulse: 78  Temp: (!) 97.5 F (36.4 C)  SpO2: 96%  Weight: 114 lb (51.7 kg)  Height: 5' (1.524 m)   96%  BMI Readings from Last 3 Encounters:  12/20/20 22.26 kg/m  12/05/20 22.40 kg/m  11/04/20 22.19 kg/m   Wt Readings from Last 3 Encounters:  12/20/20 114 lb (51.7  kg)  12/05/20 114 lb 11.2 oz (52 kg)  11/04/20 113 lb 9.6 oz (51.5 kg)   Physical Exam: General: Frail-appearing, no acute distress HENT: [place], AT, OP clear, MMM Eyes: EOMI, no scleral icterus Respiratory: Clear to auscultation bilaterally.  No crackles, wheezing or rales Cardiovascular: RRR, -M/R/G, no JVD Extremities:-Edema,-tenderness Neuro: AAO x4, CNII-XII grossly intact Psych: Normal mood, normal affect   Chest Imaging- films reviewed: CTA Chest 10/27/2019-emphysema, bilateral dependent pleural effusions.  No PE, no significant mediastinal or hilar adenopathy.  Hernia with stomach and abdominal fat contents protruding into the mediastinum.  Pulmonary Functions Testing Results: PFT Results Latest Ref Rng & Units 11/01/2020 02/13/2016  FVC-Pre L 1.82 1.82  FVC-Predicted Pre % 82 76  FVC-Post L 1.80 2.02  FVC-Predicted Post % 81 84  Pre FEV1/FVC % % 48 45  Post FEV1/FCV % % 46 47  FEV1-Pre L 0.87 0.82  FEV1-Predicted Pre % 52 46  FEV1-Post L 0.83 0.94  DLCO uncorrected ml/min/mmHg 9.89 9.75  DLCO UNC% % 60 51  DLCO corrected ml/min/mmHg 9.89 9.26  DLCO COR %Predicted % 60 49  DLVA Predicted % 80 64  TLC L 5.04 -  TLC % Predicted % 113 -  RV % Predicted % 159 -   2017- severe obstruction with out significant bronchodilator reversibility.  Moderate diffusion impairment.  Flow volume loop supports obstruction.  2019 spirometry: FVC 1.8 (75%) FEV1 0.7 (39%) Ratio 38%  PFT 11/01/20 FVC 1.8 (81%) FEV1 0.83 (50%) Ratio 48  TLC 113% RV 159% DLCO 60% Interpretation: Moderately severe obstructive defect with air trapping and reduced DLCO consistent with emphysema.  Imaging, labs and test noted above have been reviewed independently by me.    Assessment & Plan:     ICD-10-CM   1. COPD with chronic bronchitis and emphysema ([place])  J44.9   2. Seasonal allergic rhinitis due to pollen  J46.30     79 year old female with COPD with emphysema who presents for follow-up. Previously  de-escalated her inhaler to LAMA/LABA. Tolerating well with excellent control of symptoms. [name] has resolved. Some allergy symptoms. Discussed OTC management  COPD with emphysema - well-controlled --CONTINUE Anoro. Call for refills if needed --CONTINUE Albuterol as needed for shortness of breath or wheezing --Consider stepping up therapy during winter months if she becomes more symptomatic  Allergic Rhinitis - slightly worse --Take Claritin or loratidine 10 mg daily --Recommend wearing mask when able during pollen season  Thrush- resolved --No further treatment indicated  Health Maintenance Immunization History  Administered Date(s) Administered  . Fluad Quad(high Dose 65+) 06/18/2019, 07/13/2020  . Influenza, High Dose Seasonal PF 07/27/2013, 05/14/2016, 05/20/2017, 06/05/2018  . Influenza,inj,Quad PF,6+ Mos 08/03/2014, 06/21/2015  . Influenza,inj,quad, With Preservative 06/04/2019  . Influenza-Unspecified 07/04/2012  . PFIZER(Purple Top)SARS-COV-2 Vaccination 11/17/2019, 12/08/2019, 08/17/2020  .  Pneumococcal Conjugate-13 11/11/2015  . Pneumococcal-Unspecified 09/03/2010  . Td 09/03/2012   CT Lung Screening - not a candidate. Quit smoking > 15 years ago  I have spent a total time of 31-minutes on the day of the appointment reviewing prior documentation, coordinating care and discussing medical diagnosis and plan with the patient/family. Imaging, labs and tests included in this note have been reviewed and interpreted independently by me.   Zeeshan Korte Rodman Pickle, MD [facility] 12/20/2020 9:53 AM

## 2020-12-23 ENCOUNTER — Encounter: Payer: Self-pay | Admitting: Pulmonary Disease

## 2020-12-23 DIAGNOSIS — J301 Allergic rhinitis due to pollen: Secondary | ICD-10-CM | POA: Insufficient documentation

## 2020-12-26 DIAGNOSIS — J449 Chronic obstructive pulmonary disease, unspecified: Secondary | ICD-10-CM | POA: Diagnosis not present

## 2020-12-26 DIAGNOSIS — J189 Pneumonia, unspecified organism: Secondary | ICD-10-CM | POA: Diagnosis not present

## 2021-01-03 DIAGNOSIS — J189 Pneumonia, unspecified organism: Secondary | ICD-10-CM | POA: Diagnosis not present

## 2021-01-03 DIAGNOSIS — J449 Chronic obstructive pulmonary disease, unspecified: Secondary | ICD-10-CM | POA: Diagnosis not present

## 2021-01-09 ENCOUNTER — Telehealth: Payer: Self-pay | Admitting: Pulmonary Disease

## 2021-01-09 ENCOUNTER — Other Ambulatory Visit: Payer: Self-pay

## 2021-01-09 ENCOUNTER — Ambulatory Visit: Payer: Medicare HMO | Admitting: Adult Health

## 2021-01-09 ENCOUNTER — Ambulatory Visit (INDEPENDENT_AMBULATORY_CARE_PROVIDER_SITE_OTHER): Payer: Medicare HMO

## 2021-01-09 ENCOUNTER — Encounter: Payer: Self-pay | Admitting: Adult Health

## 2021-01-09 VITALS — BP 128/68 | HR 82 | Temp 98.3°F | Ht 59.0 in

## 2021-01-09 DIAGNOSIS — K449 Diaphragmatic hernia without obstruction or gangrene: Secondary | ICD-10-CM | POA: Diagnosis not present

## 2021-01-09 DIAGNOSIS — J301 Allergic rhinitis due to pollen: Secondary | ICD-10-CM

## 2021-01-09 DIAGNOSIS — S2231XA Fracture of one rib, right side, initial encounter for closed fracture: Secondary | ICD-10-CM | POA: Diagnosis not present

## 2021-01-09 DIAGNOSIS — J449 Chronic obstructive pulmonary disease, unspecified: Secondary | ICD-10-CM | POA: Diagnosis not present

## 2021-01-09 DIAGNOSIS — R0600 Dyspnea, unspecified: Secondary | ICD-10-CM | POA: Diagnosis not present

## 2021-01-09 DIAGNOSIS — J439 Emphysema, unspecified: Secondary | ICD-10-CM | POA: Diagnosis not present

## 2021-01-09 NOTE — Telephone Encounter (Signed)
Spoke with pt's daughter Cherylin Mylar (per DPR) who stated pt was outside during day yesterday and reported last night increased SOB. Per daughter pt applied oxygen and used resuce inhaler which did provide some relief. Pt stated SOB better this morning but pt is concerned about possible peumonia and new inhaler. Pt's daughter was offered and scheduled acute OV with Tammy Parrett today at 3:30 PM. Pt daughter's stated understanding. Nothing further needed at this time. Routing to [employer] as [employer]

## 2021-01-09 NOTE — Patient Instructions (Addendum)
Chest xray today .  Continue on ANORO 1 puff daily  Use Duoneb daily for 3 days and then As needed   Continue on Oxygen 2l/m At bedtime  Zyrtec 10mg  daily At bedtime  For 1 week then As needed   Mucinex DM Twice daily  As needed  Cough/congestion  Follow up with Dr. Loanne Drilling as planned and As needed   Please contact office for sooner follow up if symptoms do not improve or worsen or seek emergency care

## 2021-01-09 NOTE — Assessment & Plan Note (Signed)
Mild flare - seems to be improving  Hold on steroids and abx at this time  Use Duoneb daily for 3 days  Add zyrtec for 1 week and As needed   Chest xray   Plan  Patient Instructions  Chest xray today .  Continue on ANORO 1 puff daily  Use Duoneb daily for 3 days and then As needed   Continue on Oxygen 2l/m At bedtime  Zyrtec 10mg  daily At bedtime  For 1 week then As needed   Mucinex DM Twice daily  As needed  Cough/congestion  Follow up with Dr. Loanne Drilling as planned and As needed   Please contact office for sooner follow up if symptoms do not improve or worsen or seek emergency care

## 2021-01-09 NOTE — Progress Notes (Signed)
@Patient  ID: Sherry Lambert, female    DOB: 11/13/1940, 80 y.o.   MRN: [id number]  Chief Complaint  Patient presents with  . Follow-up    Referring provider: [name], *  HPI: 80 year old female former smoker followed for moderate to severe COPD  TEST/EVENTS :  PFT (02/13/16) FEV1/FVC 45%, FEV1 0.82 46%. No hyperinflation. DLCO 51% Postbronchodilator FEV1 53%, ratio 47, FVC 84, positive bronchodilator response. Spirometry 06/2017 -stable moderate COPD with FEV1 at 59%, ratio 44, FVC 101%.   CTA Chest 10/27/2019-emphysema, bilateral dependent pleural effusions.  No PE, no significant mediastinal or hilar adenopathy.  Hernia with stomach and abdominal fat contents protruding into the mediastinum.  01/09/2021 Acute OV : COPD  Patient presents for an acute office visit.  Patient complains of episode of shortness of breath and wheezing.  Patient says several days last week she had increased sneezing.  And yesterday started to have an episode where she felt like she was having some wheezing and shortness of breath.  She took her albuterol inhaler and placed on her oxygen.  She says this helped quite a bit.  She does not have a way to check her oxygen levels at home.  O2 saturations today in the office were 96% on room air.  She does wear oxygen 2 L at bedtime.  She is on Anoro daily.  She denies any cough, congestion, discolored mucus, fever, loss of taste or smell.  COVID-vaccine x3 including booster are up-to-date    Allergies  Allergen Reactions  . Lipitor [Atorvastatin] Itching    Immunization History  Administered Date(s) Administered  . Fluad Quad(high Dose 65+) 06/18/2019, 07/13/2020  . Influenza, High Dose Seasonal PF 07/27/2013, 05/14/2016, 05/20/2017, 06/05/2018  . Influenza,inj,Quad PF,6+ Mos 08/03/2014, 06/21/2015  . Influenza,inj,quad, With Preservative 06/04/2019  . Influenza-Unspecified 07/04/2012  . PFIZER(Purple Top)SARS-COV-2 Vaccination 11/17/2019,  12/08/2019, 08/17/2020  . Pneumococcal Conjugate-13 11/11/2015  . Pneumococcal-Unspecified 09/03/2010  . Td 09/03/2012    Past Medical History:  Diagnosis Date  . 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 [name]   . Cataract   . Chronic kidney disease    kidney stone  . Colitis   . Colon cancer ([place]) 06/2017   cecum  . Diabetes mellitus without complication ([place])    no meds taken  . Emphysema of lung ([place])   . GERD (gastroesophageal reflux disease)   . Hyperlipidemia   . Hypertension   . Peripheral arterial disease ([place])   . [name]   . Stroke [facility])    November 2016- Thanksgiving night    Tobacco History: Social History   Tobacco Use  Smoking Status Former Smoker  . Packs/day: 1.00  . Years: 55.00  . Pack years: 55.00  . Types: Cigarettes  . Quit date: 10/19/2002  . Years since quitting: 18.2  Smokeless Tobacco Never Used   Counseling given: Not Answered   Outpatient Medications Prior to Visit  Medication Sig Dispense Refill  . acetaminophen (TYLENOL) 500 MG tablet Take 500 mg by mouth daily.     Marland Kitchen albuterol (VENTOLIN HFA) 108 (90 Base) MCG/ACT inhaler Inhale 1-2 puffs into the lungs every 4 (four) hours as needed for wheezing or shortness of breath. 6.7 g 6  . aspirin EC 81 MG tablet Take 1 tablet (81 mg total) by mouth daily. 90 tablet 3  . b complex vitamins capsule Take 1 capsule by mouth daily.    . capsaicin (ZOSTRIX) 0.025 % cream Apply topically 2 (two)  times daily.    . cholecalciferol (VITAMIN D3) 25 MCG (1000 UNIT) tablet Take 1,000 Units by mouth daily.    . clotrimazole (MYCELEX) 10 MG troche SMARTSIG:1 Lozenge(s) By Mouth Daily    . gabapentin (NEURONTIN) 300 MG capsule TAKE 1 CAPSULE BY MOUTH THREE TIMES DAILY 90 capsule 6  . Lactobacillus (PROBIOTIC ACIDOPHILUS PO) Take 1 capsule by mouth daily.    Marland Kitchen omeprazole (PRILOSEC) 40 MG capsule Take 1 capsule (40 mg total) by mouth daily. 90 capsule 3  . pravastatin (PRAVACHOL) 20 MG  tablet Take 1 tablet (20 mg total) by mouth daily. 90 tablet 3  . psyllium (METAMUCIL) 58.6 % packet Take 1 packet by mouth daily.    . traMADol (ULTRAM) 50 MG tablet TAKE 1 TABLET BY MOUTH ONCE DAILY AS NEEDED 30 tablet 4  . umeclidinium-vilanterol (ANORO ELLIPTA) 62.5-25 MCG/INH AEPB Inhale 1 puff into the lungs daily. 60 each 5  . ipratropium-albuterol (DUONEB) 0.5-2.5 (3) MG/3ML SOLN Take 3 mLs by nebulization every 4 (four) hours as needed. (Patient not taking: Reported on 01/09/2021) 360 mL 6   No facility-administered medications prior to visit.     Review of Systems:   Constitutional:   No  weight loss, night sweats,  Fevers, chills, + fatigue, or  lassitude.  HEENT:   No headaches,  Difficulty swallowing,  Tooth/dental problems, or  Sore throat,                No sneezing, itching, ear ache, nasal congestion, post nasal drip,   CV:  No chest pain,  Orthopnea, PND, swelling in lower extremities, anasarca, dizziness, palpitations, syncope.   GI  No heartburn, indigestion, abdominal pain, nausea, vomiting, diarrhea, change in bowel habits, loss of appetite, bloody stools.   Resp: .  No chest wall deformity  Skin: no rash or lesions.  GU: no dysuria, change in color of urine, no urgency or frequency.  No flank pain, no hematuria   MS:  No joint pain or swelling.  No decreased range of motion.  No back pain.    Physical Exam  BP 128/68 (BP Location: Right Arm, Cuff Size: Normal)   Pulse 82   Temp 98.3 F (36.8 C) (Temporal)   Ht 4\' 11"  (1.499 m)   SpO2 96% Comment: RA  BMI 23.03 kg/m   GEN: A/Ox3; pleasant , NAD, well nourished    HEENT:  [place]/AT,   NOSE-clear, THROAT-clear, no lesions, no postnasal drip or exudate noted.   NECK:  Supple w/ fair ROM; no JVD; normal carotid impulses w/o bruits; no thyromegaly or nodules palpated; no lymphadenopathy.    RESP  Clear  P & A; w/o, wheezes/ rales/ or rhonchi. no accessory muscle use, no dullness to percussion  CARD:  RRR,  no m/r/g, no peripheral edema, pulses intact, no cyanosis or clubbing.  GI:   Soft & nt; nml bowel sounds; no organomegaly or masses detected.   Musco: Warm bil, no deformities or joint swelling noted.   Neuro: alert, no focal deficits noted.    Skin: Warm, no lesions or rashes    Lab Results:  CBC  BMET  BNP  No results found for: PROBNP  Imaging: No results found.    PFT Results Latest Ref Rng & Units 11/01/2020 02/13/2016  FVC-Pre L 1.82 1.82  FVC-Predicted Pre % 82 76  FVC-Post L 1.80 2.02  FVC-Predicted Post % 81 84  Pre FEV1/FVC % % 48 45  Post FEV1/FCV % % 46 47  FEV1-Pre L 0.87 0.82  FEV1-Predicted Pre % 52 46  FEV1-Post L 0.83 0.94  DLCO uncorrected ml/min/mmHg 9.89 9.75  DLCO UNC% % 60 51  DLCO corrected ml/min/mmHg 9.89 9.26  DLCO COR %Predicted % 60 49  DLVA Predicted % 80 64  TLC L 5.04 -  TLC % Predicted % 113 -  RV % Predicted % 159 -    No results found for: NITRICOXIDE      Assessment & Plan:   COPD, severe (HCC) Mild flare - seems to be improving  Hold on steroids and abx at this time  Use Duoneb daily for 3 days  Add zyrtec for 1 week and As needed   Chest xray   Plan  Patient Instructions  Chest xray today .  Continue on ANORO 1 puff daily  Use Duoneb daily for 3 days and then As needed   Continue on Oxygen 2l/m At bedtime  Zyrtec 10mg  daily At bedtime  For 1 week then As needed   Mucinex DM Twice daily  As needed  Cough/congestion  Follow up with Dr. Loanne Drilling as planned and As needed   Please contact office for sooner follow up if symptoms do not improve or worsen or seek emergency care       Seasonal allergic rhinitis due to pollen Mild flare Add zyrtec x 1 wk  Plan Patient Instructions  Chest xray today .  Continue on ANORO 1 puff daily  Use Duoneb daily for 3 days and then As needed   Continue on Oxygen 2l/m At bedtime  Zyrtec 10mg  daily At bedtime  For 1 week then As needed   Mucinex DM Twice daily  As needed   Cough/congestion  Follow up with Dr. Loanne Drilling as planned and As needed   Please contact office for sooner follow up if symptoms do not improve or worsen or seek emergency care          Rexene Edison, NP 01/09/2021

## 2021-01-09 NOTE — Assessment & Plan Note (Signed)
Mild flare Add zyrtec x 1 wk  Plan Patient Instructions  Chest xray today .  Continue on ANORO 1 puff daily  Use Duoneb daily for 3 days and then As needed   Continue on Oxygen 2l/m At bedtime  Zyrtec 10mg  daily At bedtime  For 1 week then As needed   Mucinex DM Twice daily  As needed  Cough/congestion  Follow up with Dr. Loanne Drilling as planned and As needed   Please contact office for sooner follow up if symptoms do not improve or worsen or seek emergency care

## 2021-01-10 ENCOUNTER — Ambulatory Visit (INDEPENDENT_AMBULATORY_CARE_PROVIDER_SITE_OTHER): Payer: Medicare HMO | Admitting: Internal Medicine

## 2021-01-10 ENCOUNTER — Encounter: Payer: Self-pay | Admitting: Internal Medicine

## 2021-01-10 VITALS — BP 150/60 | HR 80 | Temp 99.0°F | Ht 59.0 in | Wt 114.0 lb

## 2021-01-10 DIAGNOSIS — I69398 Other sequelae of cerebral infarction: Secondary | ICD-10-CM | POA: Diagnosis not present

## 2021-01-10 DIAGNOSIS — R339 Retention of urine, unspecified: Secondary | ICD-10-CM | POA: Diagnosis not present

## 2021-01-10 DIAGNOSIS — E118 Type 2 diabetes mellitus with unspecified complications: Secondary | ICD-10-CM

## 2021-01-10 DIAGNOSIS — R209 Unspecified disturbances of skin sensation: Secondary | ICD-10-CM | POA: Diagnosis not present

## 2021-01-10 LAB — URINALYSIS, ROUTINE W REFLEX MICROSCOPIC
Bilirubin Urine: NEGATIVE
Hgb urine dipstick: NEGATIVE
Ketones, ur: NEGATIVE
Nitrite: NEGATIVE
RBC / HPF: NONE SEEN (ref 0–?)
Specific Gravity, Urine: 1.01 (ref 1.000–1.030)
Total Protein, Urine: NEGATIVE
Urine Glucose: NEGATIVE
Urobilinogen, UA: 0.2 (ref 0.0–1.0)
pH: 6 (ref 5.0–8.0)

## 2021-01-10 LAB — MICROALBUMIN / CREATININE URINE RATIO
Creatinine,U: 44.7 mg/dL
Microalb Creat Ratio: 1.6 mg/g (ref 0.0–30.0)
Microalb, Ur: <0.7 mg/dL (ref 0.0–1.9)

## 2021-01-10 NOTE — Progress Notes (Signed)
   Subjective:   Patient ID: Sherry Lambert, female    DOB: December 04, 1940, 80 y.o.   MRN: [id number]  HPI The patient is a 80 YO female coming in for several concerns including right arm problems post stroke (has been cutting back on gabapentin to BID and wants to know if it is safe to cut back further, did this awhile ago, does have weakness and numbness but pain is minimal) and urinary retention (problems with urinating and not feeling like this is emptying, she has had catheter before with infection, brought sample to check today) and diabetes (want to talk about results from last time, wants to make sure this is not impacting her health).    Review of Systems  Constitutional: Positive for activity change and fatigue.  HENT: Negative.   Eyes: Negative.   Respiratory: Negative for cough, chest tightness and shortness of breath.   Cardiovascular: Negative for chest pain, palpitations and leg swelling.  Gastrointestinal: Negative for abdominal distention, abdominal pain, constipation, diarrhea, nausea and vomiting.  Musculoskeletal: Positive for gait problem.  Skin: Negative.   Neurological: Positive for tremors, weakness and numbness.  Psychiatric/Behavioral: Negative.     Objective:  Physical Exam Constitutional:      Appearance: She is well-developed.  HENT:     Head: Normocephalic and atraumatic.  Cardiovascular:     Rate and Rhythm: Normal rate and regular rhythm.  Pulmonary:     Effort: Pulmonary effort is normal. No respiratory distress.     Breath sounds: Normal breath sounds. No wheezing or rales.  Abdominal:     General: Bowel sounds are normal. There is no distension.     Palpations: Abdomen is soft.     Tenderness: There is no abdominal tenderness. There is no rebound.  Musculoskeletal:     Cervical back: Normal range of motion.  Skin:    General: Skin is warm and dry.  Neurological:     Mental Status: She is alert and oriented to person, place, and time. Mental status  is at baseline.     Cranial Nerves: Cranial nerve deficit present.     Motor: Weakness present.     Coordination: Coordination abnormal.     Comments: Walker at home, wheelchair today for distances     Vitals:   01/10/21 1001  BP: (!) 150/60  Pulse: 80  Temp: 99 F (37.2 C)  TempSrc: Oral  SpO2: 95%  Weight: 114 lb (51.7 kg)  Height: 4\' 11"  (1.499 m)    This visit occurred during the SARS-CoV-2 public health emergency.  Safety protocols were in place, including screening questions prior to the visit, additional usage of staff PPE, and extensive cleaning of exam room while observing appropriate contact time as indicated for disinfecting solutions.   Assessment & Plan:

## 2021-01-10 NOTE — Assessment & Plan Note (Signed)
Checking microalbumin to creatiine ratio today. She is diet controlled and on statin. Not on ACE-I or ARB which is why we are checking urine today. Adjust as needed.

## 2021-01-10 NOTE — Assessment & Plan Note (Signed)
Checking U/A and urine culture to assess for infection.

## 2021-01-10 NOTE — Assessment & Plan Note (Signed)
Okay with decreasing gabapentin to daily and if no difference in symptoms okay with stopping. This is not typically helpful with loss of sensation or feeling of swelling.

## 2021-01-10 NOTE — Patient Instructions (Addendum)
We will check the urine today.  Cut back to 1 pill a day of the gabapentin. Give that 1-2 weeks and if doing okay you can just stop that altogether.

## 2021-01-11 NOTE — Progress Notes (Signed)
ATC x1, LMTCB. 

## 2021-01-12 LAB — URINE CULTURE

## 2021-01-12 NOTE — Progress Notes (Signed)
Called and spoke with patient, advised of results/recommendations.  She verbalized understanding.  She states she did slide off a stool that has a top that comes off that is close to the floor, but states she did not get hurt, this was 2-3 months ago.  She was already aware of the hiatal hernia as well.  Nothing further needed.

## 2021-01-13 ENCOUNTER — Other Ambulatory Visit: Payer: Self-pay | Admitting: Internal Medicine

## 2021-01-13 MED ORDER — SULFAMETHOXAZOLE-TRIMETHOPRIM 800-160 MG PO TABS: 1.0000 | ORAL_TABLET | Freq: Two times a day (BID) | ORAL | 0 refills | Status: DC

## 2021-01-25 DIAGNOSIS — J449 Chronic obstructive pulmonary disease, unspecified: Secondary | ICD-10-CM | POA: Diagnosis not present

## 2021-01-25 DIAGNOSIS — J189 Pneumonia, unspecified organism: Secondary | ICD-10-CM | POA: Diagnosis not present

## 2021-02-03 DIAGNOSIS — J449 Chronic obstructive pulmonary disease, unspecified: Secondary | ICD-10-CM | POA: Diagnosis not present

## 2021-02-03 DIAGNOSIS — J189 Pneumonia, unspecified organism: Secondary | ICD-10-CM | POA: Diagnosis not present

## 2021-02-25 DIAGNOSIS — J449 Chronic obstructive pulmonary disease, unspecified: Secondary | ICD-10-CM | POA: Diagnosis not present

## 2021-02-25 DIAGNOSIS — J189 Pneumonia, unspecified organism: Secondary | ICD-10-CM | POA: Diagnosis not present

## 2021-03-02 ENCOUNTER — Other Ambulatory Visit: Payer: Self-pay | Admitting: Internal Medicine

## 2021-03-05 DIAGNOSIS — J449 Chronic obstructive pulmonary disease, unspecified: Secondary | ICD-10-CM | POA: Diagnosis not present

## 2021-03-05 DIAGNOSIS — J189 Pneumonia, unspecified organism: Secondary | ICD-10-CM | POA: Diagnosis not present

## 2021-03-07 DIAGNOSIS — M1711 Unilateral primary osteoarthritis, right knee: Secondary | ICD-10-CM | POA: Diagnosis not present

## 2021-03-10 ENCOUNTER — Other Ambulatory Visit: Payer: Self-pay

## 2021-03-10 ENCOUNTER — Ambulatory Visit (INDEPENDENT_AMBULATORY_CARE_PROVIDER_SITE_OTHER): Payer: Medicare HMO | Admitting: Pharmacist

## 2021-03-10 DIAGNOSIS — J449 Chronic obstructive pulmonary disease, unspecified: Secondary | ICD-10-CM

## 2021-03-10 DIAGNOSIS — E118 Type 2 diabetes mellitus with unspecified complications: Secondary | ICD-10-CM

## 2021-03-10 DIAGNOSIS — R682 Dry mouth, unspecified: Secondary | ICD-10-CM

## 2021-03-10 DIAGNOSIS — R209 Unspecified disturbances of skin sensation: Secondary | ICD-10-CM

## 2021-03-10 DIAGNOSIS — E1169 Type 2 diabetes mellitus with other specified complication: Secondary | ICD-10-CM | POA: Diagnosis not present

## 2021-03-10 DIAGNOSIS — I10 Essential (primary) hypertension: Secondary | ICD-10-CM | POA: Diagnosis not present

## 2021-03-10 DIAGNOSIS — I69398 Other sequelae of cerebral infarction: Secondary | ICD-10-CM

## 2021-03-10 DIAGNOSIS — E785 Hyperlipidemia, unspecified: Secondary | ICD-10-CM

## 2021-03-10 NOTE — Progress Notes (Signed)
Chronic Care Management Pharmacy Note  03/10/2021 Name:  Sherry Lambert MRN:  [id number] DOB:  [birth year]  Summary: -Pt endorses compliances with medications as prescribed -Pt reports occasional dry mouth; denies thrush  Recommendations/Changes made from today's visit: -Advised Biotene mouth wash for dry mouth as well as maintaining adequate hydration   Subjective: Sherry Lambert is an 80 y.o. year old female who is a primary patient of [name], MD.  The CCM team was consulted for assistance with disease management and care coordination needs.    Engaged with patient by telephone for follow up visit in response to provider referral for pharmacy case management and/or care coordination services.   Consent to Services:  The patient was given information about Chronic Care Management services, agreed to services, and gave verbal consent prior to initiation of services.  Please see initial visit note for detailed documentation.   Patient Care Team: [name], MD as PCP - General (Internal Medicine) Charlton Haws, [facility] as Pharmacist (Pharmacist)  Recent office visits: 01/10/21 Dr [name] OV: chronic f/u - c/o R arm problems post-stroke, urinary retention, diabetes. Ordered UA. MACR. Ordered Bactrim for UTI.  11/04/20 Dr [name] OV: acute visit for blue toes w/ pain. Repeat ABI.  07/26/20 Dr [name] OV: acute visit for UTI, rx'd macrobid.  Recent consult visits: 01/09/21 NP Parrett (pulmonary): COPD exacerbation. Advised Zyrtec. CXR negative for PNA, did show rib fracture incidentally.  12/20/20 Dr Loanne Drilling (pulmonary): f/u COPD; no med changes  12/05/20 PA [name] (VVS): ABI unchanged, blue color may be microvascular PAD d/t DM. Continue ASA and statin.  11/01/20 Dr Loanne Drilling (pulmonary): Dx COPD 2016, has been on Trelegy. Last exacerbation Feb 2021 w/ hospitalization. Thrush resolved w/ mouth rinsin and chronic fluconazole. New PFTs c/w emphysema. Switched  Trelegy to [employer], stop fluconazole after 1 week.  Hospital visits: None in previous 6 months  Objective:  Lab Results  Component Value Date   CREATININE 0.87 07/13/2020   BUN 11 07/13/2020   GFR 63.96 07/13/2020   GFRNONAA >60 01/16/2020   GFRAA >60 01/16/2020   NA 141 07/13/2020   K 4.9 07/13/2020   CALCIUM 9.8 07/13/2020   CO2 30 07/13/2020   GLUCOSE 108 (H) 07/13/2020    Lab Results  Component Value Date/Time   HGBA1C 6.7 (H) 07/13/2020 10:35 AM   HGBA1C 6.5 (H) 10/27/2019 07:23 PM   GFR 63.96 07/13/2020 10:35 AM   GFR 49.72 (L) 06/18/2019 08:30 AM   MICROALBUR <0.7 01/10/2021 10:50 AM   MICROALBUR 1.1 06/18/2019 08:37 AM    Last diabetic Eye exam: No results found for: HMDIABEYEEXA  Last diabetic Foot exam: No results found for: HMDIABFOOTEX   Lab Results  Component Value Date   CHOL 162 07/13/2020   HDL 75.50 07/13/2020   LDLCALC 71 07/13/2020   LDLDIRECT 122.4 09/15/2013   TRIG 78.0 07/13/2020   CHOLHDL 2 07/13/2020    Hepatic Function Latest Ref Rng & Units 07/13/2020 01/15/2020 01/14/2020  Total Protein 6.0 - 8.3 g/dL 6.9 6.1(L) 7.7  Albumin 3.5 - 5.2 g/dL 4.1 3.3(L) 4.1  AST 0 - 37 U/L _0 ALT 0 - 35 U/L _1 Alk Phosphatase 39 - 117 U/L 68 61 77  Total Bilirubin 0.2 - 1.2 mg/dL 0.3 1.0 0.9  Bilirubin, Direct 0.0 - 0.3 mg/dL - - -    Lab Results  Component Value Date/Time   TSH 1.28 11/04/2012 11:45 AM    CBC Latest  Ref Rng & Units 01/16/2020 01/15/2020 01/14/2020  WBC 4.0 - 10.5 K/uL 10.2 13.9(H) 27.0(H)  Hemoglobin 12.0 - 15.0 g/dL 12.2 12.6 15.1(H)  Hematocrit 36.0 - 46.0 % 39.3 40.7 48.1(H)  Platelets 150 - 400 K/uL 211 204 262    No results found for: VD25OH  Clinical ASCVD: Yes  The 10-year ASCVD risk score Mikey Bussing DC Jr., et al., 2013) is: 49.5%   Values used to calculate the score:     Age: 81 years     Sex: Female     Is Non-Hispanic African American: No     Diabetic: Yes     Tobacco smoker: No     Systolic Blood  Pressure: 150 mmHg     Is BP treated: No     HDL Cholesterol: 75.5 mg/dL     Total Cholesterol: 162 mg/dL    Depression screen [facility] 2/9 01/10/2021 06/23/2020 11/05/2019  Decreased Interest 0 0 0  Down, Depressed, Hopeless 0 0 0  PHQ - 2 Score 0 0 0  Some recent data might be hidden     Social History   Tobacco Use  Smoking Status Former   Packs/day: 1.00   Years: 55.00   Pack years: 55.00   Types: Cigarettes   Quit date: 10/19/2002   Years since quitting: 18.4  Smokeless Tobacco Never   BP Readings from Last 3 Encounters:  01/10/21 (!) 150/60  01/09/21 128/68  12/20/20 118/72   Pulse Readings from Last 3 Encounters:  01/10/21 80  01/09/21 82  12/20/20 78   Wt Readings from Last 3 Encounters:  01/10/21 114 lb (51.7 kg)  12/20/20 114 lb (51.7 kg)  12/05/20 114 lb 11.2 oz (52 kg)   BMI Readings from Last 3 Encounters:  01/10/21 23.03 kg/m  01/09/21 23.03 kg/m  12/20/20 22.26 kg/m    Assessment/Interventions: Review of patient past medical history, allergies, medications, health status, including review of consultants reports, laboratory and other test data, was performed as part of comprehensive evaluation and provision of chronic care management services.   SDOH:  (Social Determinants of Health) assessments and interventions performed: Yes  SDOH Screenings   Alcohol Screen: Low Risk    Last Alcohol Screening Score (AUDIT): 0  Depression (PHQ2-9): Low Risk    PHQ-2 Score: 0  Financial Resource Strain: Low Risk    Difficulty of Paying Living Expenses: Not hard at all  Food Insecurity: No Food Insecurity   Worried About Charity fundraiser in the Last Year: Never true   Ran Out of Food in the Last Year: Never true  Housing: Low Risk    Last Housing Risk Score: 0  Physical Activity: Inactive   Days of Exercise per Week: 0 days   Minutes of Exercise per Session: 0 min  Social Connections: Socially Isolated   Frequency of Communication with Friends and Family:  More than three times a week   Frequency of Social Gatherings with Friends and Family: More than three times a week   Attends Religious Services: Never   Marine scientist or Organizations: No   Attends Archivist Meetings: Never   Marital Status: Widowed  Stress: No Stress Concern Present   Feeling of Stress : Not at all  Tobacco Use: Medium Risk   Smoking Tobacco Use: Former   Smokeless Tobacco Use: Never  Transportation Needs: No Data processing manager (Medical): No   Lack of Transportation (Non-Medical): No    CCM Care Plan  Allergies  Allergen Reactions   Lipitor [Atorvastatin] Itching    Medications Reviewed Today     Reviewed by Charlton Haws, [facility] (Pharmacist) on 03/10/21 at 1440  Med List Status: <None>   Medication Order Taking? Sig Documenting Provider Last Dose Status Informant  acetaminophen (TYLENOL) 500 MG tablet [id number] Yes Take 500 mg by mouth daily.  [provider] Taking Active Self           Med Note Broadus John, REGEENA   Thu Jan 14, 2020  2:19 PM)    albuterol (VENTOLIN HFA) 108 (90 Base) MCG/ACT inhaler [id number] Yes Inhale 1-2 puffs into the lungs every 4 (four) hours as needed for wheezing or shortness of breath. [name], MD Taking Active Self  aspirin EC 81 MG tablet [id number] Yes Take 1 tablet (81 mg total) by mouth daily. Rosalin Hawking, MD Taking Active Self  b complex vitamins capsule [id number] Yes Take 1 capsule by mouth daily. [provider] Taking Active   cholecalciferol (VITAMIN D3) 25 MCG (1000 UNIT) tablet [id number] Yes Take 1,000 Units by mouth daily. [provider] Taking Active   gabapentin (NEURONTIN) 300 MG capsule [id number] Yes TAKE 1 CAPSULE BY MOUTH THREE TIMES DAILY [name], MD Taking Active   ipratropium-albuterol (DUONEB) 0.5-2.5 (3) MG/3ML SOLN [id number] Yes Take 3 mLs by nebulization every 4 (four) hours as needed. Georgette Shell,  MD Taking Active   omeprazole (PRILOSEC) 40 MG capsule [id number] Yes Take 1 capsule by mouth once daily [name], MD Taking Active   pravastatin (PRAVACHOL) 20 MG tablet [id number] Yes Take 1 tablet (20 mg total) by mouth daily. [name], MD Taking Active   psyllium (METAMUCIL) 58.6 % packet [id number] Yes Take 1 packet by mouth daily. [provider] Taking Active   traMADol (ULTRAM) 50 MG tablet [id number] Yes TAKE 1 TABLET BY MOUTH ONCE DAILY AS NEEDED [name], MD Taking Active   umeclidinium-vilanterol [facility] ELLIPTA) 62.5-25 MCG/INH AEPB [id number] Yes Inhale 1 puff into the lungs daily. 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    Class: Acute   Cecum mass    Mass in rectum    PVD (peripheral vascular disease) ([place]) 09/14/2016   Essential hypertension 12/20/2014   Routine general medical examination at a health care facility 10/22/2014   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) 08/04/2014   Peripheral arterial disease ([place]) 01/05/2014   Osteopenia 11/12/2013   Hyperlipidemia associated with type 2 diabetes mellitus ([place]) 09/19/2013   Esophageal stricture 12/23/2012   Diabetes mellitus type 2, controlled, 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[place]) 72/05/4708    Immunization History  Administered Date(s) Administered   Fluad Quad(high Dose 65+) 06/18/2019, 07/13/2020   Influenza, High Dose Seasonal PF 07/27/2013, 05/14/2016, 05/20/2017, 06/05/2018   Influenza,inj,Quad PF,6+ Mos 08/03/2014, 06/21/2015   Influenza,inj,quad, With Preservative  06/04/2019   Influenza-Unspecified 07/04/2012   PFIZER(Purple Top)SARS-COV-2 Vaccination 11/17/2019, 12/08/2019, 08/17/2020   Pneumococcal Conjugate-13 11/11/2015   Pneumococcal-Unspecified 09/03/2010   Td 09/03/2012    Conditions to be addressed/monitored:  Hypertension, Hyperlipidemia,  Diabetes, COPD and Osteoarthritis, Thrush, Neuropathy  Care Plan : [place]  Updates made by Charlton Haws, [place] since 03/10/2021 12:00 AM     Problem: Hypertension, Hyperlipidemia, Diabetes, COPD and Osteoarthritis, Thrush, Neuropathy   Priority: High     Long-Range Goal: Disease management   Start Date: 12/07/2020  Expected End Date: 06/08/2021  This Visit's Progress: On track  Recent Progress: On track  Priority: High  Note:   Current Barriers:  Unable to independently monitor therapeutic efficacy  Pharmacist Clinical Goal(s):  Patient will achieve adherence to monitoring guidelines and medication adherence to achieve therapeutic efficacy achieve control of thrush, nerve pain as evidenced by patient report through collaboration with PharmD and provider.   Interventions: 1:1 collaboration with [name], MD regarding development and update of comprehensive plan of care as evidenced by provider attestation and co-signature Inter-disciplinary care team collaboration (see longitudinal plan of care) Comprehensive medication review performed; medication list updated in electronic medical record  Hypertension (BP goal <130/80) -Controlled without medication -Medications previously tried: lisinopril, furosemide,   -Current home readings: 117/80 -Denies hypotensive/hypertensive symptoms -Educated on BP goals and benefits of medications for prevention of heart attack, stroke and kidney damage; Daily salt intake goal < 2300 mg; -Counseled to monitor BP at home weekly, document, and provide log at future appointments -Counseled on diet and exercise  extensively  Hyperlipidemia: (LDL goal < 70) -Controlled - LDL at goal, pt endorses compliance with statin and denies side effects -hx PAD, CVA -Current treatment: Pravastatin 20 mg daily AM Aspirin 81 mg daily AM -Current exercise habits: walking, exercises for blood flow -Educated on Cholesterol goals;  Benefits of statin for ASCVD risk reduction; -Recommended to continue current medication  Diabetes (A1c goal <7%) -Diet-controlled -Medications previously tried: none -Denies hypoglycemic/hyperglycemic symptoms -Educated on A1c and blood sugar goals; -Counseled to check feet daily and get yearly eye exams -Counseled on diet and exercise extensively  COPD (Goal: control symptoms and prevent exacerbations) -Controlled - last year Trelegy was switched to Anoro due to thrush; pt reports no loss in efficacy after switching -Gold Grade: Gold 2 (FEV1 50-79%) -Current COPD Classification:  B (high sx, <2 exacerbations/yr) -MMRC/CAT score: not on file -Pulmonary function testing: 11/01/20- FEV1 50% predicted, FEV1/FVC 0.46 -Exacerbations requiring treatment in last 6 months: 1 -Current treatment  Anoro Ellipta (umeclidinium-vilanterol) 1 puff daily Duoneb q4h PRN Albuterol HFA prn -Medications previously tried: Trelegy (thrush) -Patient reports consistent use of maintenance inhaler -Frequency of rescue inhaler use: 1-2x per week -Counseled on Proper inhaler technique; When to use rescue inhaler -Recommended to continue current medication  Thrush (Goal: eliminate thrush) -Improving - pt has documented history of thrush previously, like from ICS in Trelegy; now pt reports only dry mouth; she is not sure she ever had thrush; she denies whiteness on tongue, cheeks; she is using Listerine mouthwash regularly but has not tried Biotene -Current treatment: none -Counseled on causes of thrush including ICS and antibiotics that get rid of good bacteria as well -Advised pt to maintain adequate  hydration and try Biotene mouthwash for dry mouth  Sensory alteration s/p CVA (Goal: improve symptoms) -Improving - pt did trial off of gabapentin and pain was worse so she restarted it; she is taking higher doses of Tylenol now and pain is better controlled -Current treatment  Gabapentin 300 mg TID Tramadol 50 mg daily most days Tylenol 500 mg - up to 3000 mg/day -Counseled on benefits of Vitamin B complex for nerve issues  Health Maintenance -Vaccine gaps: Covid booster, Shingrix -Advised to get vaccines, pt cannot drive and is not able to get out to pharmacy at the moment  Patient Goals/Self-Care Activities Patient will:  - take medications as prescribed focus on medication adherence by pill box  -Try Biotene mouth wash for dry mouth; drink 64 oz of water daily -Try Vitamin B complex for nerve health       Medication Assistance: None required.  Patient affirms current coverage meets needs.  Compliance/Adherence/Medication fill history: Care Gaps: Shingrix Covid booster (11/15/20) Eye exam (09/17/16) Hepatitis C screening A1c due (01/10/21)  Star-Rating Drugs: Pravastatin - LF 01/17/21 x 90 ds  Patient's preferred pharmacy is:  [facility], [address] [address] Phone: [phone] Fax: [phone]  Uses pill box? Yes Pt endorses 100% compliance  We discussed: Current pharmacy is preferred with insurance plan and patient is satisfied with pharmacy services Patient decided to: Continue current medication management strategy  Care Plan and Follow Up Patient Decision:  Patient agrees to Care Plan and Follow-up.  Plan: Telephone follow up appointment with care management team member scheduled for:  6 months  Charlene Brooke, PharmD, [place], CPP Clinical Pharmacist [facility] at [facility] [phone]

## 2021-03-10 NOTE — Patient Instructions (Signed)
Visit Information  Phone number for Pharmacist: [phone]   Goals Addressed             This Visit's Progress    Manage My Medicine       Timeframe:  Long-Range Goal Priority:  High Start Date:   12/07/20                          Expected End Date:        06/08/21               Follow Up Date 04/01/21    - call for medicine refill 2 or 3 days before it runs out - call if I am sick and can't take my medicine - keep a list of all the medicines I take; vitamins and herbals too   -Try Vitamin B Complex for nerves -Try Biotene mouth wash for dry mouth  Why is this important?   These steps will help you keep on track with your medicines.   Notes:          The patient verbalized understanding of instructions, educational materials, and care plan provided today and declined offer to receive copy of patient instructions, educational materials, and care plan.  Telephone follow up appointment with pharmacy team member scheduled for: 6 months  Charlene Brooke, PharmD, [place], CPP Clinical Pharmacist [facility] at [facility] [phone]

## 2021-03-17 ENCOUNTER — Other Ambulatory Visit: Payer: Self-pay | Admitting: Internal Medicine

## 2021-03-27 DIAGNOSIS — J189 Pneumonia, unspecified organism: Secondary | ICD-10-CM | POA: Diagnosis not present

## 2021-03-27 DIAGNOSIS — J449 Chronic obstructive pulmonary disease, unspecified: Secondary | ICD-10-CM | POA: Diagnosis not present

## 2021-04-05 DIAGNOSIS — J189 Pneumonia, unspecified organism: Secondary | ICD-10-CM | POA: Diagnosis not present

## 2021-04-05 DIAGNOSIS — J449 Chronic obstructive pulmonary disease, unspecified: Secondary | ICD-10-CM | POA: Diagnosis not present

## 2021-04-20 ENCOUNTER — Other Ambulatory Visit: Payer: Self-pay | Admitting: Internal Medicine

## 2021-04-24 ENCOUNTER — Telehealth: Payer: Self-pay | Admitting: Internal Medicine

## 2021-04-25 NOTE — Telephone Encounter (Signed)
Patient requesting status of refill.

## 2021-04-26 ENCOUNTER — Other Ambulatory Visit: Payer: Self-pay

## 2021-04-26 ENCOUNTER — Encounter: Payer: Self-pay | Admitting: Internal Medicine

## 2021-04-26 ENCOUNTER — Ambulatory Visit (INDEPENDENT_AMBULATORY_CARE_PROVIDER_SITE_OTHER): Payer: Medicare HMO | Admitting: Internal Medicine

## 2021-04-26 DIAGNOSIS — B370 Candidal stomatitis: Secondary | ICD-10-CM | POA: Diagnosis not present

## 2021-04-26 MED ORDER — NYSTATIN 100000 UNIT/ML MT SUSP: 5.0000 mL | Freq: Three times a day (TID) | OROMUCOSAL | 0 refills | Status: DC

## 2021-04-26 NOTE — Patient Instructions (Signed)
We have sent in nystatin to take 5 mL 3 times a day for 1 week.

## 2021-04-26 NOTE — Progress Notes (Signed)
   Subjective:   Patient ID: Sherry Lambert, female    DOB: 07/27/1941, 80 y.o.   MRN: [id number]  HPI The patient is a 80 YO female coming in for concerns about thrush and gabapentin.  Review of Systems  Constitutional: Negative.   HENT: Negative.         Mouth problem  Eyes: Negative.   Respiratory:  Negative for cough, chest tightness and shortness of breath.   Cardiovascular:  Negative for chest pain, palpitations and leg swelling.  Gastrointestinal:  Negative for abdominal distention, abdominal pain, constipation, diarrhea, nausea and vomiting.  Musculoskeletal: Negative.   Skin: Negative.   Neurological: Negative.   Psychiatric/Behavioral: Negative.     Objective:  Physical Exam Constitutional:      Appearance: She is well-developed.  HENT:     Head: Normocephalic and atraumatic.     Comments: No visible thrush, dry lips Cardiovascular:     Rate and Rhythm: Normal rate and regular rhythm.  Pulmonary:     Effort: Pulmonary effort is normal. No respiratory distress.     Breath sounds: Normal breath sounds. No wheezing or rales.  Abdominal:     General: Bowel sounds are normal. There is no distension.     Palpations: Abdomen is soft.     Tenderness: There is no abdominal tenderness. There is no rebound.  Musculoskeletal:     Cervical back: Normal range of motion.  Skin:    General: Skin is warm and dry.  Neurological:     Mental Status: She is alert and oriented to person, place, and time.     Coordination: Coordination normal.    Vitals:   04/26/21 1000  BP: 130/78  Pulse: 73  Resp: 18  Temp: 98.3 F (36.8 C)  TempSrc: Oral  SpO2: 98%  Weight: 108 lb 9.6 oz (49.3 kg)  Height: '4\' 11"'$  (1.499 m)    This visit occurred during the SARS-CoV-2 public health emergency.  Safety protocols were in place, including screening questions prior to the visit, additional usage of staff PPE, and extensive cleaning of exam room while observing appropriate contact time as  indicated for disinfecting solutions.   Assessment & Plan:

## 2021-04-27 DIAGNOSIS — J189 Pneumonia, unspecified organism: Secondary | ICD-10-CM | POA: Diagnosis not present

## 2021-04-27 DIAGNOSIS — J449 Chronic obstructive pulmonary disease, unspecified: Secondary | ICD-10-CM | POA: Diagnosis not present

## 2021-04-28 NOTE — Assessment & Plan Note (Signed)
I do not see visible thrush but she states present in the morning. She is taking inhalers although not steroid inhalers. Rx nystatin swish and swallow. Advised to use vaseline on the lips TID to help dryness. We talked about possible acid damage.

## 2021-05-06 DIAGNOSIS — J189 Pneumonia, unspecified organism: Secondary | ICD-10-CM | POA: Diagnosis not present

## 2021-05-06 DIAGNOSIS — J449 Chronic obstructive pulmonary disease, unspecified: Secondary | ICD-10-CM | POA: Diagnosis not present

## 2021-05-22 ENCOUNTER — Telehealth: Payer: Self-pay | Admitting: Pulmonary Disease

## 2021-05-22 ENCOUNTER — Other Ambulatory Visit: Payer: Self-pay

## 2021-05-22 ENCOUNTER — Other Ambulatory Visit: Payer: Self-pay | Admitting: Pulmonary Disease

## 2021-05-22 ENCOUNTER — Encounter: Payer: Self-pay | Admitting: Pulmonary Disease

## 2021-05-22 ENCOUNTER — Ambulatory Visit (INDEPENDENT_AMBULATORY_CARE_PROVIDER_SITE_OTHER): Payer: Medicare HMO

## 2021-05-22 ENCOUNTER — Ambulatory Visit: Payer: Medicare HMO | Admitting: Pulmonary Disease

## 2021-05-22 ENCOUNTER — Other Ambulatory Visit: Payer: Self-pay | Admitting: Internal Medicine

## 2021-05-22 VITALS — BP 122/62 | HR 88 | Temp 98.3°F | Ht 59.0 in | Wt 111.4 lb

## 2021-05-22 DIAGNOSIS — J439 Emphysema, unspecified: Secondary | ICD-10-CM | POA: Diagnosis not present

## 2021-05-22 DIAGNOSIS — R0602 Shortness of breath: Secondary | ICD-10-CM | POA: Diagnosis not present

## 2021-05-22 DIAGNOSIS — K449 Diaphragmatic hernia without obstruction or gangrene: Secondary | ICD-10-CM | POA: Diagnosis not present

## 2021-05-22 DIAGNOSIS — J449 Chronic obstructive pulmonary disease, unspecified: Secondary | ICD-10-CM

## 2021-05-22 DIAGNOSIS — J441 Chronic obstructive pulmonary disease with (acute) exacerbation: Secondary | ICD-10-CM | POA: Diagnosis not present

## 2021-05-22 MED ORDER — DOXYCYCLINE HYCLATE 100 MG PO CAPS: 100.0000 mg | ORAL_CAPSULE | Freq: Two times a day (BID) | ORAL | 0 refills | Status: DC

## 2021-05-22 MED ORDER — ALBUTEROL SULFATE (2.5 MG/3ML) 0.083% IN NEBU: 2.5000 mg | INHALATION_SOLUTION | Freq: Four times a day (QID) | RESPIRATORY_TRACT | 5 refills | Status: DC | PRN

## 2021-05-22 NOTE — Progress Notes (Signed)
Chief Complaint  Patient presents with   Acute Visit    SOB and pneumonia symptoms   Summary: 80 yo female former smoker followed by Dr. Loanne Drilling for severe COPD from emphysema.  History: She presents for an acute visit.  She is here with her son.  She started feeling more short of breath over the past few days.  She feels tight in her chest.  Has been using duoneb and ventolin more. This helps some.  Not having fever, hemoptysis, or wheeze.  Has chronic sinus drainage.  Denies abdominal pain or leg swelling.    Was on trelegy, but had terrible thrush.  Switched to anoro and breathing was okay with that until recently.  No recent sick exposures.  Chest xray today showed hyperinflation and kyphosis.  Vital signs: BP 122/62 (BP Location: Left Arm, Cuff Size: Normal)   Pulse 88   Temp 98.3 F (36.8 C) (Oral)   Ht 4\' 11"  (1.499 m)   Wt 111 lb 6.4 oz (50.5 kg)   SpO2 94%   BMI 22.50 kg/m   Physical exam:  General - alert Eyes - pupils reactive ENT - no sinus tenderness, no stridor Cardiac - regular rate/rhythm, no murmur Chest - equal breath sounds b/l, no wheezing or rales Extremities - no cyanosis, clubbing, or edema Skin - no rashes  Assessment/plan:  COPD exacerbation. - will give her course of doxyxcycline capsule (she has trouble swallowing pills - changed to albuterol nebulizer and advised her to use this more until symptoms better - explained no added benefit to using ipratropium while she is using LAMA - continue anoro - transitioned off trelegy due to recurrent thrush; if she needs to be restarted on ICS then could consider inhaler with spacer or nebulized budesonide - don't think she needs prednisone at this time  Follow up: Patient Instructions  Doxycycline 100 mg capsule twice per day for 7 days  Albuterol nebulizer every 6 hours as needed for cough, wheeze, chest congestion, or shortness of breath  Follow up in 4 months with Dr. Loanne Drilling  Time spent  in patient care on day of visit: 33 minutes  Medications: Allergies as of 05/22/2021       Reactions   Lipitor [atorvastatin] Itching        Medication List        Accurate as of May 22, 2021 11:25 AM. If you have any questions, ask your nurse or doctor.          STOP taking these medications    b complex vitamins capsule Stopped by: Chesley Mires, MD   cholecalciferol 25 MCG (1000 UNIT) tablet Commonly known as: VITAMIN D3 Stopped by: Chesley Mires, MD   ipratropium-albuterol 0.5-2.5 (3) MG/3ML Soln Commonly known as: DUONEB Stopped by: Chesley Mires, MD       TAKE these medications    acetaminophen 500 MG tablet Commonly known as: TYLENOL Take 500 mg by mouth daily.   albuterol 108 (90 Base) MCG/ACT inhaler Commonly known as: VENTOLIN HFA Inhale 1-2 puffs into the lungs every 4 (four) hours as needed for wheezing or shortness of breath. What changed: Another medication with the same name was added. Make sure you understand how and when to take each. Changed by: Chesley Mires, MD   albuterol (2.5 MG/3ML) 0.083% nebulizer solution Commonly known as: PROVENTIL Take 3 mLs (2.5 mg total) by nebulization every 6 (six) hours as needed for wheezing or shortness of breath. What changed: You were already taking a medication  with the same name, and this prescription was added. Make sure you understand how and when to take each. Changed by: Chesley Mires, MD   Anoro Ellipta 62.5-25 MCG/INH Aepb Generic drug: umeclidinium-vilanterol Inhale 1 puff into the lungs daily.   aspirin EC 81 MG tablet Take 1 tablet (81 mg total) by mouth daily.   doxycycline 100 MG capsule Commonly known as: VIBRAMYCIN Take 1 capsule (100 mg total) by mouth 2 (two) times daily. Started by: Chesley Mires, MD   gabapentin 300 MG capsule Commonly known as: NEURONTIN TAKE 1 CAPSULE BY MOUTH THREE TIMES DAILY   nystatin 100000 UNIT/ML suspension Commonly known as: MYCOSTATIN Take 5 mLs  (500,000 Units total) by mouth in the morning, at noon, and at bedtime.   omeprazole 40 MG capsule Commonly known as: PRILOSEC Take 1 capsule by mouth once daily   pravastatin 20 MG tablet Commonly known as: PRAVACHOL Take 1 tablet (20 mg total) by mouth daily.   psyllium 58.6 % packet Commonly known as: METAMUCIL Take 1 packet by mouth daily.   traMADol 50 MG tablet Commonly known as: ULTRAM TAKE 1 TABLET BY MOUTH ONCE DAILY AS NEEDED       Signature: Chesley Mires, MD [place] Pager - [phone] 05/22/2021, 11:25 AM

## 2021-05-22 NOTE — Patient Instructions (Signed)
Doxycycline 100 mg capsule twice per day for 7 days  Albuterol nebulizer every 6 hours as needed for cough, wheeze, chest congestion, or shortness of breath  Follow up in 4 months with Dr. Loanne Drilling

## 2021-05-22 NOTE — Telephone Encounter (Signed)
Spoke with the pt's daughter, Maudie Mercury  She states that the pt has been having increased SOB for the past 3 days  She says that she gets winded walking short distances and "can't catch her breath"  She also c/o weakness  She is wheezing some but no cough  No f/c/s, aches  She is using her albuterol inhaler and neb about 2 x per day with little relief  No covid test done- daughter states she is sure it's not covid bc she never leaves her home at all  She states when she gets like this it usually means that she has pna  Appt with VS today at 10:30 am and she will be here at 10:15 am

## 2021-05-28 DIAGNOSIS — J449 Chronic obstructive pulmonary disease, unspecified: Secondary | ICD-10-CM | POA: Diagnosis not present

## 2021-05-28 DIAGNOSIS — J189 Pneumonia, unspecified organism: Secondary | ICD-10-CM | POA: Diagnosis not present

## 2021-06-05 DIAGNOSIS — J449 Chronic obstructive pulmonary disease, unspecified: Secondary | ICD-10-CM | POA: Diagnosis not present

## 2021-06-05 DIAGNOSIS — J189 Pneumonia, unspecified organism: Secondary | ICD-10-CM | POA: Diagnosis not present

## 2021-06-15 ENCOUNTER — Telehealth: Payer: Self-pay

## 2021-06-15 NOTE — Progress Notes (Signed)
    Chronic Care Management Pharmacy Assistant   Name: Sherry Lambert  MRN: [id number] DOB: [birth year]  Reason for Encounter: Disease State - General Adherence   Recent office visits:  04/26/21 [name] (PCP) - [name]. Start Nystatin 500,000 units. & Albuterol 2.5 mg. D/c B complex, Vit. D. & ipratropium-albuterol.  Recent consult visits:  05/22/21 [name] (Pulmonary) - COPD. Start Doxycycline 100 mg.   Hospital visits:  None in previous 6 months  Medications: Outpatient Encounter Medications as of 06/15/2021  Medication Sig   acetaminophen (TYLENOL) 500 MG tablet Take 500 mg by mouth daily.    albuterol (PROVENTIL) (2.5 MG/3ML) 0.083% nebulizer solution Take 3 mLs (2.5 mg total) by nebulization every 6 (six) hours as needed for wheezing or shortness of breath.   albuterol (VENTOLIN HFA) 108 (90 Base) MCG/ACT inhaler Inhale 1-2 puffs into the lungs every 4 (four) hours as needed for wheezing or shortness of breath.   aspirin EC 81 MG tablet Take 1 tablet (81 mg total) by mouth daily.   doxycycline (VIBRAMYCIN) 100 MG capsule Take 1 capsule (100 mg total) by mouth 2 (two) times daily.   gabapentin (NEURONTIN) 300 MG capsule TAKE 1 CAPSULE BY MOUTH THREE TIMES DAILY   nystatin (MYCOSTATIN) 100000 UNIT/ML suspension Take 5 mLs (500,000 Units total) by mouth in the morning, at noon, and at bedtime.   omeprazole (PRILOSEC) 40 MG capsule Take 1 capsule by mouth once daily   pravastatin (PRAVACHOL) 20 MG tablet Take 1 tablet (20 mg total) by mouth daily.   psyllium (METAMUCIL) 58.6 % packet Take 1 packet by mouth daily.   traMADol (ULTRAM) 50 MG tablet TAKE 1 TABLET BY MOUTH ONCE DAILY AS NEEDED   umeclidinium-vilanterol (ANORO ELLIPTA) 62.5-25 MCG/INH AEPB Inhale 1 puff into the lungs daily.   No facility-administered encounter medications on file as of 06/15/2021.    Have you had any problems recently with your health? Patient states no recent problems.  Have you had any problems with  your pharmacy? Patient states she hates when she call in her refills and they are not ready and she have to come back to get them.    What issues or side effects are you having with your medications? Patient states no issues or side effects.   What would you like me to pass along to [facility] for them to help you with?  Patient stated she wants to stop taking her Tramadol she have not taking any in a week, she currently takes 3 Tylenol a day instead.   What can we do to take care of you better? Patient stated she would like to get 90 day supply of her Gabapentin because she don't drive and its hard for her to get a ride to the pharmacy.   Star Rating Drugs: Pravastatin - filled 04/08/21 90D  Care Gaps Colonoscopy - n/a Diabetic Foot Exam - n/a Mammogram - n/a Ophthalmology - 09/18/15 Dexa Scan - n/a Annual Well Visit - n/a Micro albumin - <0.7 Hemoglobin A1c -  6.7   Orinda Kenner, [place] Clinical Pharmacists Assistant [phone]

## 2021-06-16 ENCOUNTER — Telehealth: Payer: Self-pay | Admitting: Pulmonary Disease

## 2021-06-16 MED ORDER — ANORO ELLIPTA 62.5-25 MCG/INH IN AEPB: 1.0000 | INHALATION_SPRAY | Freq: Every day | RESPIRATORY_TRACT | 11 refills | Status: DC

## 2021-06-16 NOTE — Telephone Encounter (Signed)
Spoke with the pt and notified rx for Anoro was sent to pharm.

## 2021-06-21 ENCOUNTER — Other Ambulatory Visit: Payer: Self-pay | Admitting: Internal Medicine

## 2021-06-27 DIAGNOSIS — J449 Chronic obstructive pulmonary disease, unspecified: Secondary | ICD-10-CM | POA: Diagnosis not present

## 2021-06-27 DIAGNOSIS — J189 Pneumonia, unspecified organism: Secondary | ICD-10-CM | POA: Diagnosis not present

## 2021-07-06 DIAGNOSIS — J449 Chronic obstructive pulmonary disease, unspecified: Secondary | ICD-10-CM | POA: Diagnosis not present

## 2021-07-06 DIAGNOSIS — J189 Pneumonia, unspecified organism: Secondary | ICD-10-CM | POA: Diagnosis not present

## 2021-07-21 DIAGNOSIS — M25571 Pain in right ankle and joints of right foot: Secondary | ICD-10-CM | POA: Diagnosis not present

## 2021-07-28 DIAGNOSIS — J449 Chronic obstructive pulmonary disease, unspecified: Secondary | ICD-10-CM | POA: Diagnosis not present

## 2021-07-28 DIAGNOSIS — J189 Pneumonia, unspecified organism: Secondary | ICD-10-CM | POA: Diagnosis not present

## 2021-08-05 DIAGNOSIS — J189 Pneumonia, unspecified organism: Secondary | ICD-10-CM | POA: Diagnosis not present

## 2021-08-05 DIAGNOSIS — J449 Chronic obstructive pulmonary disease, unspecified: Secondary | ICD-10-CM | POA: Diagnosis not present

## 2021-08-09 ENCOUNTER — Ambulatory Visit (INDEPENDENT_AMBULATORY_CARE_PROVIDER_SITE_OTHER): Payer: Medicare HMO | Admitting: Internal Medicine

## 2021-08-09 ENCOUNTER — Other Ambulatory Visit: Payer: Self-pay

## 2021-08-09 ENCOUNTER — Encounter: Payer: Self-pay | Admitting: Internal Medicine

## 2021-08-09 VITALS — BP 118/70 | HR 47 | Resp 18 | Ht 59.0 in | Wt 114.0 lb

## 2021-08-09 DIAGNOSIS — E1169 Type 2 diabetes mellitus with other specified complication: Secondary | ICD-10-CM | POA: Diagnosis not present

## 2021-08-09 DIAGNOSIS — E785 Hyperlipidemia, unspecified: Secondary | ICD-10-CM

## 2021-08-09 DIAGNOSIS — I10 Essential (primary) hypertension: Secondary | ICD-10-CM

## 2021-08-09 DIAGNOSIS — I739 Peripheral vascular disease, unspecified: Secondary | ICD-10-CM | POA: Diagnosis not present

## 2021-08-09 DIAGNOSIS — R209 Unspecified disturbances of skin sensation: Secondary | ICD-10-CM

## 2021-08-09 DIAGNOSIS — E118 Type 2 diabetes mellitus with unspecified complications: Secondary | ICD-10-CM | POA: Diagnosis not present

## 2021-08-09 DIAGNOSIS — Z0000 Encounter for general adult medical examination without abnormal findings: Secondary | ICD-10-CM

## 2021-08-09 DIAGNOSIS — I69398 Other sequelae of cerebral infarction: Secondary | ICD-10-CM | POA: Diagnosis not present

## 2021-08-09 LAB — COMPREHENSIVE METABOLIC PANEL
ALT: 10 U/L (ref 0–35)
AST: 17 U/L (ref 0–37)
Albumin: 4.1 g/dL (ref 3.5–5.2)
Alkaline Phosphatase: 63 U/L (ref 39–117)
BUN: 15 mg/dL (ref 6–23)
CO2: 29 mEq/L (ref 19–32)
Calcium: 10 mg/dL (ref 8.4–10.5)
Chloride: 105 mEq/L (ref 96–112)
Creatinine, Ser: 0.82 mg/dL (ref 0.40–1.20)
GFR: 68.16 mL/min (ref >60.00)
Glucose, Bld: 105 mg/dL — ABNORMAL HIGH (ref 70–99)
Potassium: 4.4 mEq/L (ref 3.5–5.1)
Sodium: 140 mEq/L (ref 135–145)
Total Bilirubin: 0.4 mg/dL (ref 0.2–1.2)
Total Protein: 6.8 g/dL (ref 6.0–8.3)

## 2021-08-09 LAB — LIPID PANEL
Cholesterol: 142 mg/dL (ref 0–200)
HDL: 70.4 mg/dL (ref >39.00)
LDL Cholesterol: 56 mg/dL (ref 0–99)
NonHDL: 71.35
Total CHOL/HDL Ratio: 2
Triglycerides: 77 mg/dL (ref 0.0–149.0)
VLDL: 15.4 mg/dL (ref 0.0–40.0)

## 2021-08-09 LAB — HEMOGLOBIN A1C: Hgb A1c MFr Bld: 6.3 % (ref 4.6–6.5)

## 2021-08-09 LAB — CBC
HCT: 43 % (ref 36.0–46.0)
Hemoglobin: 13.7 g/dL (ref 12.0–15.0)
MCHC: 32 g/dL (ref 30.0–36.0)
MCV: 94.7 fl (ref 78.0–100.0)
Platelets: 216 10*3/uL (ref 150.0–400.0)
RBC: 4.54 Mil/uL (ref 3.87–5.11)
RDW: 13.1 % (ref 11.5–15.5)
WBC: 6.6 10*3/uL (ref 4.0–10.5)

## 2021-08-09 MED ORDER — BACLOFEN 10 MG PO TABS: 10.0000 mg | ORAL_TABLET | Freq: Two times a day (BID) | ORAL | 0 refills | Status: DC

## 2021-08-09 NOTE — Progress Notes (Signed)
   Subjective:   Patient ID: Sherry Lambert, female    DOB: 1941-03-21, 80 y.o.   MRN: [id number]  HPI The patient is a 80 YO female coming in for physical. Has some questions about her pain medications.   PMH, [facility], social history reviewed and updated  Review of Systems  Constitutional:  Positive for appetite change.  HENT: Negative.    Eyes: Negative.   Respiratory:  Negative for cough, chest tightness and shortness of breath.   Cardiovascular:  Negative for chest pain, palpitations and leg swelling.  Gastrointestinal:  Negative for abdominal distention, abdominal pain, constipation, diarrhea, nausea and vomiting.  Musculoskeletal:  Positive for arthralgias and myalgias.  Skin: Negative.   Neurological:  Positive for weakness and numbness.  Psychiatric/Behavioral: Negative.     Objective:  Physical Exam Constitutional:      Appearance: She is well-developed.  HENT:     Head: Normocephalic and atraumatic.  Cardiovascular:     Rate and Rhythm: Normal rate and regular rhythm.  Pulmonary:     Effort: Pulmonary effort is normal. No respiratory distress.     Breath sounds: Normal breath sounds. No wheezing or rales.  Abdominal:     General: Bowel sounds are normal. There is no distension.     Palpations: Abdomen is soft.     Tenderness: There is no abdominal tenderness. There is no rebound.  Musculoskeletal:     Cervical back: Normal range of motion.  Skin:    General: Skin is warm and dry.  Neurological:     Mental Status: She is alert and oriented to person, place, and time.     Coordination: Coordination abnormal.     Comments: Right hemiparesis stable from prior    Vitals:   08/09/21 1044  BP: 118/70  Pulse: (!) 47  Resp: 18  SpO2: 100%  Weight: 114 lb (51.7 kg)  Height: 4\' 11"  (1.499 m)    This visit occurred during the SARS-CoV-2 public health emergency.  Safety protocols were in place, including screening questions prior to the visit, additional usage of staff  PPE, and extensive cleaning of exam room while observing appropriate contact time as indicated for disinfecting solutions.   Assessment & Plan:

## 2021-08-09 NOTE — Patient Instructions (Addendum)
Tylenol the safe dose is 6 pills per day.  We have sent in the muscle relaxer baclofen to use up to twice a day.

## 2021-08-10 NOTE — Assessment & Plan Note (Signed)
Flu shot up to date. covid-19 up to date. Pneumonia up to date. Shingrix counseled to get at pharmacy. Tetanus due 2024. Colonoscopy follows with GI aged out. Mammogram aged out, pap smear aged out and dexa declines. Counseled about sun safety and mole surveillance. Counseled about the dangers of distracted driving. Given 10 year screening recommendations.

## 2021-08-10 NOTE — Assessment & Plan Note (Signed)
Foot exam done, checking HgA1c and lipid panel and microalbumin to creatinine ratio. Adjust as needed. She is diet controlled currently. On statin. Not on ACE-I or ARB.

## 2021-08-10 NOTE — Assessment & Plan Note (Signed)
Taking statin and aspirin 81 mg daily.

## 2021-08-10 NOTE — Assessment & Plan Note (Signed)
We talked about increase to gabapentin or adding muscle relaxer for her achilles tendon and we elected to add baclofen 10 mg BID prn for pain to see if this helps. If not we will stop and gradually increase gabapentin to 600 mg TID over about 3-4 weeks.

## 2021-08-10 NOTE — Assessment & Plan Note (Signed)
BP at goal, checking CMP. Adjust as needed she is diet controlled currently.

## 2021-08-10 NOTE — Assessment & Plan Note (Signed)
Checking lipid panel and adjust pravastatin 20 mg daily for goal LDL <70.

## 2021-08-14 ENCOUNTER — Ambulatory Visit (INDEPENDENT_AMBULATORY_CARE_PROVIDER_SITE_OTHER): Payer: Medicare HMO

## 2021-08-14 ENCOUNTER — Other Ambulatory Visit: Payer: Self-pay

## 2021-08-14 DIAGNOSIS — Z0000 Encounter for general adult medical examination without abnormal findings: Secondary | ICD-10-CM

## 2021-08-14 NOTE — Progress Notes (Signed)
I connected with Sherry Lambert today by telephone and verified that I am speaking with the correct person using two identifiers. Location patient: home Location provider: work Persons participating in the virtual visit: patient, provider.   I discussed the limitations, risks, security and privacy concerns of performing an evaluation and management service by telephone and the availability of in person appointments. I also discussed with the patient that there may be a patient responsible charge related to this service. The patient expressed understanding and verbally consented to this telephonic visit.    Interactive audio and video telecommunications were attempted between this provider and patient, however failed, due to patient having technical difficulties OR patient did not have access to video capability.  We continued and completed visit with audio only.  Some vital signs may be absent or patient reported.   Time Spent with patient on telephone encounter: 40 minutes  Subjective:   Sherry Lambert is a 80 y.o. female who presents for Medicare Annual (Subsequent) preventive examination.  Review of Systems     Cardiac Risk Factors include: advanced age (>60men, >7 women);diabetes mellitus;dyslipidemia;family history of premature cardiovascular disease;hypertension     Objective:    There were no vitals filed for this visit. There is no height or weight on file to calculate BMI.  Advanced Directives 08/14/2021 06/23/2020 01/14/2020 01/14/2020 10/27/2019 10/27/2019 10/17/2019  Does Patient Have a Medical Advance Directive? No No No No No No No  Would patient like information on creating a medical advance directive? No - Patient declined No - Patient declined No - Patient declined Yes (ED - Information included in AVS) No - Patient declined - No - Patient declined    Current Medications (verified) Outpatient Encounter Medications as of 08/14/2021  Medication Sig   acetaminophen  (TYLENOL) 500 MG tablet Take 500 mg by mouth daily.    albuterol (PROVENTIL) (2.5 MG/3ML) 0.083% nebulizer solution Take 3 mLs (2.5 mg total) by nebulization every 6 (six) hours as needed for wheezing or shortness of breath.   albuterol (VENTOLIN HFA) 108 (90 Base) MCG/ACT inhaler Inhale 1-2 puffs into the lungs every 4 (four) hours as needed for wheezing or shortness of breath.   aspirin EC 81 MG tablet Take 1 tablet (81 mg total) by mouth daily.   baclofen (LIORESAL) 10 MG tablet Take 1 tablet (10 mg total) by mouth 2 (two) times daily.   doxycycline (VIBRAMYCIN) 100 MG capsule Take 1 capsule (100 mg total) by mouth 2 (two) times daily.   gabapentin (NEURONTIN) 300 MG capsule TAKE 1 CAPSULE BY MOUTH THREE TIMES DAILY   nystatin (MYCOSTATIN) 100000 UNIT/ML suspension Take 5 mLs (500,000 Units total) by mouth in the morning, at noon, and at bedtime.   omeprazole (PRILOSEC) 40 MG capsule Take 1 capsule by mouth once daily   pravastatin (PRAVACHOL) 20 MG tablet Take 1 tablet (20 mg total) by mouth daily.   psyllium (METAMUCIL) 58.6 % packet Take 1 packet by mouth daily.   traMADol (ULTRAM) 50 MG tablet TAKE 1 TABLET BY MOUTH ONCE DAILY AS NEEDED   umeclidinium-vilanterol (ANORO ELLIPTA) 62.5-25 MCG/INH AEPB Inhale 1 puff into the lungs daily.   No facility-administered encounter medications on file as of 08/14/2021.    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   [name]    Cataract    Chronic kidney disease    kidney stone   Colitis  Colon cancer ([place]) 06/2017   cecum   Diabetes mellitus without complication (HCC)    no meds taken   Emphysema of lung (HCC)    GERD (gastroesophageal reflux disease)    Hyperlipidemia    Hypertension    Peripheral arterial disease ([place])    [name]    Stroke [facility])    November 2016- Thanksgiving night   Past Surgical History:   Procedure Laterality Date   ABDOMINAL HYSTERECTOMY     BALLOON DILATION N/A 12/23/2012   Procedure: BALLOON DILATION;  Surgeon: Inda Castle, MD;  Location: [facility] ENDOSCOPY;  Service: Endoscopy;  Laterality: N/A;   BALLOON DILATION N/A 01/15/2013   Procedure: BALLOON DILATION;  Surgeon: Inda Castle, MD;  Location: [facility];  Service: Endoscopy;  Laterality: N/A;   BALLOON DILATION N/A 07/22/2018   Procedure: BALLOON DILATION;  Surgeon: Mauri Pole, MD;  Location: [facility];  Service: Endoscopy;  Laterality: N/A;   cataract     both eyes   COLONOSCOPY     COLONOSCOPY WITH PROPOFOL N/A 03/05/2017   Procedure: COLONOSCOPY WITH PROPOFOL;  Surgeon: Mauri Pole, MD;  Location: [facility];  Service: Endoscopy;  Laterality: N/A;   DENTAL RESTORATION/EXTRACTION WITH X-RAY     ESOPHAGOGASTRODUODENOSCOPY N/A 12/23/2012   Procedure: ESOPHAGOGASTRODUODENOSCOPY (EGD);  Surgeon: Inda Castle, MD;  Location: [facility];  Service: Endoscopy;  Laterality: N/A;   ESOPHAGOGASTRODUODENOSCOPY N/A 01/15/2013   Procedure: ESOPHAGOGASTRODUODENOSCOPY (EGD);  Surgeon: Inda Castle, MD;  Location: [facility];  Service: Endoscopy;  Laterality: N/A;   ESOPHAGOGASTRODUODENOSCOPY (EGD) WITH PROPOFOL N/A 07/22/2018   Procedure: ESOPHAGOGASTRODUODENOSCOPY (EGD) WITH PROPOFOL;  Surgeon: Mauri Pole, MD;  Location: [facility];  Service: Endoscopy;  Laterality: N/A;   TONSILLECTOMY     Family History  Problem Relation Age of Onset   Hypertension Father    Diabetes Father    Cancer Father        melanoma   Emphysema Mother    Colon cancer Sister 40   Cancer Sister        colon, surgery alone    Emphysema Sister    Early death Neg Hx    Stroke Neg Hx    Esophageal cancer Neg Hx    Rectal cancer Neg Hx    Stomach cancer Neg Hx    Social History   Socioeconomic History   Marital status: Divorced    Spouse name: Not on file   Number of children: 1   Years of education:  Not on file   Highest education level: Not on file  Occupational History   Occupation: Retired    Comment: [employer]  Tobacco Use   Smoking status: Former    Packs/day: 1.00    Years: 55.00    Pack years: 55.00    Types: Cigarettes    Quit date: 10/19/2002    Years since quitting: 18.8   Smokeless tobacco: Never  Vaping Use   Vaping Use: Never used  Substance and Sexual Activity   Alcohol use: No    Alcohol/week: 0.0 standard drinks   Drug use: No   Sexual activity: Never    Birth control/protection: Surgical  Other Topics Concern   Not on file  Social History Narrative   Regular exercise-no   Caffeine Use-yes   Social Determinants of Health   Financial Resource Strain: Low Risk    Difficulty of Paying Living Expenses: Not hard at all  Food Insecurity: No Food Insecurity   Worried  About Running Out of Food in the Last Year: Never true   Ran Out of Food in the Last Year: Never true  Transportation Needs: No Transportation Needs   Lack of Transportation (Medical): No   Lack of Transportation (Non-Medical): No  Physical Activity: Insufficiently Active   Days of Exercise per Week: 7 days   Minutes of Exercise per Session: 20 min  Stress: No Stress Concern Present   Feeling of Stress : Not at all  Social Connections: Socially Isolated   Frequency of Communication with Friends and Family: More than three times a week   Frequency of Social Gatherings with Friends and Family: More than three times a week   Attends Religious Services: Never   Marine scientist or Organizations: No   Attends Archivist Meetings: Never   Marital Status: Widowed    Tobacco Counseling Counseling given: Not Answered   Clinical Intake:  Pre-visit preparation completed: Yes  Pain : No/denies pain     Nutritional Risks: None Diabetes: Yes CBG done?: No Did pt. bring in CBG monitor from home?: No  How often do you need to have someone help you when you read  instructions, pamphlets, or other written materials from your doctor or pharmacy?: 1 - Never What is the last grade level you completed in school?: High School Graduate  Diabetic? yes  Interpreter Needed?: No  Information entered by :: Lisette Abu, LPN   Activities of Daily Living In your present state of health, do you have any difficulty performing the following activities: 08/14/2021 08/09/2021  Hearing? N N  Vision? N N  Difficulty concentrating or making decisions? Y N  Walking or climbing stairs? N N  Dressing or bathing? N N  Doing errands, shopping? N N  Preparing Food and eating ? N -  Using the Toilet? N -  In the past six months, have you accidently leaked urine? N -  Do you have problems with loss of bowel control? N -  Managing your Medications? N -  Managing your Finances? N -  Housekeeping or managing your Housekeeping? N -  Some recent data might be hidden    Patient Care Team: [name], MD as PCP - General (Internal Medicine) Charlton Haws, [facility] as Pharmacist (Pharmacist)  Indicate any recent Medical Services you may have received from other than [facility] providers in the past year (date may be approximate).     Assessment:   This is a routine wellness examination for [place].  Hearing/Vision screen Hearing Screening - Comments:: Patient denied any hearing difficulty.   No hearing aids.  Vision Screening - Comments:: Patient wears corrective glasses/contacts.  Eye exam done annually by: Clent Jacks, MD.  Dietary issues and exercise activities discussed: Current Exercise Habits: Home exercise routine, Type of exercise: Other - see comments (chair exercises, stretch bands), Time (Minutes): 20, Frequency (Times/Week): 7, Weekly Exercise (Minutes/Week): 140, Intensity: Mild, Exercise limited by: respiratory conditions(s);orthopedic condition(s)   Goals Addressed   None   Depression Screen PHQ 2/9 Scores 08/14/2021 08/14/2021 01/10/2021  06/23/2020 11/05/2019 09/15/2018 12/03/2016  PHQ - 2 Score 0 0 0 0 0 0 0    Fall Risk Fall Risk  08/14/2021 01/10/2021 06/23/2020 11/05/2019 09/15/2018  Falls in the past year? 0 1 1 1  0  Number falls in past yr: 0 0 1 0 -  Injury with Fall? 0 0 1 1 -  Risk for fall due to : No Fall Risks - Impaired balance/gait - -  Follow up Falls prevention discussed Falls evaluation completed Falls evaluation completed - -    FALL RISK PREVENTION PERTAINING TO THE HOME:  Any stairs in or around the home? No  If so, are there any without handrails? No  Home free of loose throw rugs in walkways, pet beds, electrical cords, etc? Yes  Adequate lighting in your home to reduce risk of falls? Yes   ASSISTIVE DEVICES UTILIZED TO PREVENT FALLS:  Life alert? No  Use of a cane, walker or w/c? Yes  Grab bars in the bathroom? Yes  Shower chair or bench in shower? Yes  Elevated toilet seat or a handicapped toilet? Yes   TIMED UP AND GO:  Was the test performed? No .  Length of time to ambulate 10 feet: n/a sec.   Gait slow and steady with assistive device (per patient)  Cognitive Function: Normal cognitive status assessed by direct observation by this Nurse Health Advisor. No abnormalities found.          Immunizations Immunization History  Administered Date(s) Administered   Fluad Quad(high Dose 65+) 06/18/2019, 07/13/2020, 07/19/2021   Influenza, High Dose Seasonal PF 07/27/2013, 05/14/2016, 05/20/2017, 06/05/2018   Influenza,inj,Quad PF,6+ Mos 08/03/2014, 06/21/2015   Influenza,inj,quad, With Preservative 06/04/2019   Influenza-Unspecified 07/04/2012   PFIZER(Purple Top)SARS-COV-2 Vaccination 11/17/2019, 12/08/2019, 08/17/2020   Pneumococcal Conjugate-13 11/11/2015   Pneumococcal-Unspecified 09/03/2010   Td 09/03/2012    TDAP status: Up to date  Flu Vaccine status: Up to date  Pneumococcal vaccine status: Due, Education has been provided regarding the importance of this vaccine. Advised may  receive this vaccine at local pharmacy or Health Dept. Aware to provide a copy of the vaccination record if obtained from local pharmacy or Health Dept. Verbalized acceptance and understanding.  Covid-19 vaccine status: Completed vaccines  Qualifies for Shingles Vaccine? Yes   Zostavax completed No   Shingrix Completed?: No.    Education has been provided regarding the importance of this vaccine. Patient has been advised to call insurance company to determine out of pocket expense if they have not yet received this vaccine. Advised may also receive vaccine at local pharmacy or Health Dept. Verbalized acceptance and understanding.  Screening Tests Health Maintenance  Topic Date Due   Hepatitis C Screening  Never done   Zoster Vaccines- Shingrix (1 of 2) Never done   OPHTHALMOLOGY EXAM  09/17/2016   Pneumonia Vaccine 37+ Years old (2 - PPSV23 if available, else PCV20) 11/10/2016   COVID-19 Vaccine (4 - Booster for Pfizer series) 10/12/2020   DEXA SCAN  01/10/2022 (Originally 06/12/2007)   URINE MICROALBUMIN  01/10/2022   HEMOGLOBIN A1C  02/07/2022   FOOT EXAM  08/09/2022   TETANUS/TDAP  09/03/2022   INFLUENZA VACCINE  Completed   HPV VACCINES  Aged Out    Health Maintenance  Health Maintenance Due  Topic Date Due   Hepatitis C Screening  Never done   Zoster Vaccines- Shingrix (1 of 2) Never done   OPHTHALMOLOGY EXAM  09/17/2016   Pneumonia Vaccine 8+ Years old (2 - PPSV23 if available, else PCV20) 11/10/2016   COVID-19 Vaccine (4 - Booster for Pfizer series) 10/12/2020    Colorectal cancer screening: No longer required.   Mammogram status: No longer required due to patient refusal.  Bone density status: Postponed until 01/2022  Lung Cancer Screening: (Low Dose CT Chest recommended if Age 61-80 years, 30 pack-year currently smoking OR have quit w/in 15years.) does not qualify.   Lung Cancer Screening Referral: no  Additional Screening:  Hepatitis C Screening: does qualify;  Completed no  Vision Screening: Recommended annual ophthalmology exams for early detection of glaucoma and other disorders of the eye. Is the patient up to date with their annual eye exam?  Yes  Who is the provider or what is the name of the office in which the patient attends annual eye exams? Clent Jacks, MD If pt is not established with a provider, would they like to be referred to a provider to establish care? No .   Dental Screening: Recommended annual dental exams for proper oral hygiene  Community Resource Referral / Chronic Care Management: CRR required this visit?  No   CCM required this visit?  No      Plan:     I have personally reviewed and noted the following in the patient's chart:   Medical and social history Use of alcohol, tobacco or illicit drugs  Current medications and supplements including opioid prescriptions.  Functional ability and status Nutritional status Physical activity Advanced directives List of other physicians Hospitalizations, surgeries, and ER visits in previous 12 months Vitals Screenings to include cognitive, depression, and falls Referrals and appointments  In addition, I have reviewed and discussed with patient certain preventive protocols, quality metrics, and best practice recommendations. A written personalized care plan for preventive services as well as general preventive health recommendations were provided to patient.     Sheral Flow, LPN   81/[id number]/7262   Nurse Notes:  Patient is cogitatively intact. There were no vitals filed for this visit. There is no height or weight on file to calculate BMI. Hearing Screening - Comments:: Patient denied any hearing difficulty.   No hearing aids.  Vision Screening - Comments:: Patient wears corrective glasses/contacts.  Eye exam done annually by: Clent Jacks, MD.

## 2021-08-27 DIAGNOSIS — J189 Pneumonia, unspecified organism: Secondary | ICD-10-CM | POA: Diagnosis not present

## 2021-08-27 DIAGNOSIS — J449 Chronic obstructive pulmonary disease, unspecified: Secondary | ICD-10-CM | POA: Diagnosis not present

## 2021-09-05 ENCOUNTER — Telehealth: Payer: Medicare HMO

## 2021-09-05 DIAGNOSIS — J189 Pneumonia, unspecified organism: Secondary | ICD-10-CM | POA: Diagnosis not present

## 2021-09-05 DIAGNOSIS — J449 Chronic obstructive pulmonary disease, unspecified: Secondary | ICD-10-CM | POA: Diagnosis not present

## 2021-09-21 ENCOUNTER — Ambulatory Visit: Payer: Medicare HMO | Admitting: Pulmonary Disease

## 2021-09-27 DIAGNOSIS — J189 Pneumonia, unspecified organism: Secondary | ICD-10-CM | POA: Diagnosis not present

## 2021-09-27 DIAGNOSIS — J449 Chronic obstructive pulmonary disease, unspecified: Secondary | ICD-10-CM | POA: Diagnosis not present

## 2021-09-29 ENCOUNTER — Other Ambulatory Visit: Payer: Self-pay | Admitting: Internal Medicine

## 2021-10-04 DIAGNOSIS — E1142 Type 2 diabetes mellitus with diabetic polyneuropathy: Secondary | ICD-10-CM | POA: Diagnosis not present

## 2021-10-04 DIAGNOSIS — K219 Gastro-esophageal reflux disease without esophagitis: Secondary | ICD-10-CM | POA: Diagnosis not present

## 2021-10-04 DIAGNOSIS — M1990 Unspecified osteoarthritis, unspecified site: Secondary | ICD-10-CM | POA: Diagnosis not present

## 2021-10-04 DIAGNOSIS — Z825 Family history of asthma and other chronic lower respiratory diseases: Secondary | ICD-10-CM | POA: Diagnosis not present

## 2021-10-04 DIAGNOSIS — Z7982 Long term (current) use of aspirin: Secondary | ICD-10-CM | POA: Diagnosis not present

## 2021-10-04 DIAGNOSIS — I69351 Hemiplegia and hemiparesis following cerebral infarction affecting right dominant side: Secondary | ICD-10-CM | POA: Diagnosis not present

## 2021-10-04 DIAGNOSIS — E785 Hyperlipidemia, unspecified: Secondary | ICD-10-CM | POA: Diagnosis not present

## 2021-10-04 DIAGNOSIS — Z809 Family history of malignant neoplasm, unspecified: Secondary | ICD-10-CM | POA: Diagnosis not present

## 2021-10-04 DIAGNOSIS — J449 Chronic obstructive pulmonary disease, unspecified: Secondary | ICD-10-CM | POA: Diagnosis not present

## 2021-10-04 DIAGNOSIS — G8929 Other chronic pain: Secondary | ICD-10-CM | POA: Diagnosis not present

## 2021-10-04 DIAGNOSIS — Z008 Encounter for other general examination: Secondary | ICD-10-CM | POA: Diagnosis not present

## 2021-10-04 DIAGNOSIS — Z8249 Family history of ischemic heart disease and other diseases of the circulatory system: Secondary | ICD-10-CM | POA: Diagnosis not present

## 2021-10-04 DIAGNOSIS — R030 Elevated blood-pressure reading, without diagnosis of hypertension: Secondary | ICD-10-CM | POA: Diagnosis not present

## 2021-10-06 DIAGNOSIS — J189 Pneumonia, unspecified organism: Secondary | ICD-10-CM | POA: Diagnosis not present

## 2021-10-06 DIAGNOSIS — J449 Chronic obstructive pulmonary disease, unspecified: Secondary | ICD-10-CM | POA: Diagnosis not present

## 2021-10-10 ENCOUNTER — Ambulatory Visit: Payer: Medicare HMO | Admitting: Pulmonary Disease

## 2021-10-10 ENCOUNTER — Encounter: Payer: Self-pay | Admitting: Pulmonary Disease

## 2021-10-10 ENCOUNTER — Other Ambulatory Visit: Payer: Self-pay

## 2021-10-10 ENCOUNTER — Telehealth: Payer: Medicare HMO

## 2021-10-10 VITALS — BP 130/70 | HR 81 | Temp 98.1°F | Ht 59.0 in | Wt 113.0 lb

## 2021-10-10 DIAGNOSIS — J441 Chronic obstructive pulmonary disease with (acute) exacerbation: Secondary | ICD-10-CM | POA: Diagnosis not present

## 2021-10-10 MED ORDER — PREDNISONE 10 MG PO TABS: 30.0000 mg | ORAL_TABLET | Freq: Every day | ORAL | 0 refills | Status: AC

## 2021-10-10 MED ORDER — ALBUTEROL SULFATE HFA 108 (90 BASE) MCG/ACT IN AERS: 1.0000 | INHALATION_SPRAY | RESPIRATORY_TRACT | 6 refills | Status: DC | PRN

## 2021-10-10 NOTE — Progress Notes (Incomplete)
Chronic Care Management Pharmacy Note  10/10/2021 Name:  Sherry Lambert MRN:  [id number] DOB:  12/09/1940  Summary: -Pt endorses compliances with medications as prescribed -Pt reports occasional dry mouth; denies thrush  Recommendations/Changes made from today's visit: -Advised Biotene mouth wash for dry mouth as well as maintaining adequate hydration   Subjective: Sherry Lambert is an 81 y.o. year old female who is a primary patient of [name], MD.  The CCM team was consulted for assistance with disease management and care coordination needs.    Engaged with patient by telephone for follow up visit in response to provider referral for pharmacy case management and/or care coordination services.   Consent to Services:  The patient was given information about Chronic Care Management services, agreed to services, and gave verbal consent prior to initiation of services.  Please see initial visit note for detailed documentation.   Patient Care Team: [name], MD as PCP - General (Internal Medicine) Charlton Haws, [facility] as Pharmacist (Pharmacist)  Recent office visits: 08/09/2021 - Dr. [name] - start baclofen 69m BID prn   04/26/2021 - Dr. CSharlet Salina- evaluation of thrush - prescribed nystatin to swish and swallow   Recent consult visits: 10/10/2021 - Dr. ELoanne Drilling- Pulmonology - rx'd prednisone 363mx 5 days  07/21/2021 - Dr. GiGladstone Lighter Orthopedic surgery - pain in right ankle - notes not available  05/22/2021 - Dr. SoHalford Chessman Pulmonology - COPD exacerbation - prescribed doxycycline, albuterol nebulizer - f/u in 4 months   Hospital visits: None in previous 6 months  Objective:  Lab Results  Component Value Date   CREATININE 0.82 08/09/2021   BUN 15 08/09/2021   GFR 68.16 08/09/2021   GFRNONAA >60 01/16/2020   GFRAA >60 01/16/2020   NA 140 08/09/2021   K 4.4 08/09/2021   CALCIUM 10.0 08/09/2021   CO2 29 08/09/2021   GLUCOSE 105 (H) 08/09/2021     Lab Results  Component Value Date/Time   HGBA1C 6.3 08/09/2021 11:17 AM   HGBA1C 6.7 (H) 07/13/2020 10:35 AM   GFR 68.16 08/09/2021 11:17 AM   GFR 63.96 07/13/2020 10:35 AM   MICROALBUR <0.7 01/10/2021 10:50 AM   MICROALBUR 1.1 06/18/2019 08:37 AM    Last diabetic Eye exam: No results found for: HMDIABEYEEXA  Last diabetic Foot exam: No results found for: HMDIABFOOTEX   Lab Results  Component Value Date   CHOL 142 08/09/2021   HDL 70.40 08/09/2021   LDLCALC 56 08/09/2021   LDLDIRECT 122.4 09/15/2013   TRIG 77.0 08/09/2021   CHOLHDL 2 08/09/2021    Hepatic Function Latest Ref Rng & Units 08/09/2021 07/13/2020 01/15/2020  Total Protein 6.0 - 8.3 g/dL 6.8 6.9 6.1(L)  Albumin 3.5 - 5.2 g/dL 4.1 4.1 3.3(L)  AST 0 - 37 U/L _0 ALT 0 - 35 U/L _1 Alk Phosphatase 39 - 117 U/L 63 68 61  Total Bilirubin 0.2 - 1.2 mg/dL 0.4 0.3 1.0  Bilirubin, Direct 0.0 - 0.3 mg/dL - - -    Lab Results  Component Value Date/Time   TSH 1.28 11/04/2012 11:45 AM    CBC Latest Ref Rng & Units 08/09/2021 01/16/2020 01/15/2020  WBC 4.0 - 10.5 K/uL 6.6 10.2 13.9(H)  Hemoglobin 12.0 - 15.0 g/dL 13.7 12.2 12.6  Hematocrit 36.0 - 46.0 % 43.0 39.3 40.7  Platelets 150.0 - 400.0 K/uL 216.0 211 204    No results found for: VD25OH  Clinical ASCVD: Yes  The 10-year ASCVD risk score (Arnett DK, et al., 2019) is: 44.5%   Values used to calculate the score:     Age: 36 years     Sex: Female     Is Non-Hispanic African American: No     Diabetic: Yes     Tobacco smoker: No     Systolic Blood Pressure: 921 mmHg     Is BP treated: No     HDL Cholesterol: 70.4 mg/dL     Total Cholesterol: 142 mg/dL    Depression screen [facility] 2/9 08/14/2021 08/14/2021 01/10/2021  Decreased Interest 0 0 0  Down, Depressed, Hopeless 0 0 0  PHQ - 2 Score 0 0 0  Some recent data might be hidden     Social History   Tobacco Use  Smoking Status Former   Packs/day: 1.00   Years: 55.00   Pack years: 55.00    Types: Cigarettes   Quit date: 10/19/2002   Years since quitting: 18.9  Smokeless Tobacco Never   BP Readings from Last 3 Encounters:  10/10/21 130/70  08/09/21 118/70  05/22/21 122/62   Pulse Readings from Last 3 Encounters:  10/10/21 81  08/09/21 (!) 47  05/22/21 88   Wt Readings from Last 3 Encounters:  10/10/21 113 lb (51.3 kg)  08/09/21 114 lb (51.7 kg)  05/22/21 111 lb 6.4 oz (50.5 kg)   BMI Readings from Last 3 Encounters:  10/10/21 22.82 kg/m  08/09/21 23.03 kg/m  05/22/21 22.50 kg/m    Assessment/Interventions: Review of patient past medical history, allergies, medications, health status, including review of consultants reports, laboratory and other test data, was performed as part of comprehensive evaluation and provision of chronic care management services.   SDOH:  (Social Determinants of Health) assessments and interventions performed: Yes  SDOH Screenings   Alcohol Screen: Low Risk    Last Alcohol Screening Score (AUDIT): 0  Depression (PHQ2-9): Low Risk    PHQ-2 Score: 0  Financial Resource Strain: Low Risk    Difficulty of Paying Living Expenses: Not hard at all  Food Insecurity: No Food Insecurity   Worried About Charity fundraiser in the Last Year: Never true   Ran Out of Food in the Last Year: Never true  Housing: Low Risk    Last Housing Risk Score: 0  Physical Activity: Insufficiently Active   Days of Exercise per Week: 7 days   Minutes of Exercise per Session: 20 min  Social Connections: Socially Isolated   Frequency of Communication with Friends and Family: More than three times a week   Frequency of Social Gatherings with Friends and Family: More than three times a week   Attends Religious Services: Never   Marine scientist or Organizations: No   Attends Archivist Meetings: Never   Marital Status: Widowed  Stress: No Stress Concern Present   Feeling of Stress : Not at all  Tobacco Use: Medium Risk   Smoking Tobacco  Use: Former   Smokeless Tobacco Use: Never   Passive Exposure: Not on file  Transportation Needs: No Transportation Needs   Lack of Transportation (Medical): No   Lack of Transportation (Non-Medical): No    CCM Care Plan  Allergies  Allergen Reactions   Lipitor [Atorvastatin] Itching    Medications Reviewed Today     Reviewed by Darliss Ridgel, CMA (Certified Medical Assistant) on 10/10/21 at 34  Med List Status: <None>   Medication Order Taking? Sig Documenting Provider Last Dose Status  Informant  acetaminophen (TYLENOL) 500 MG tablet [id number]  Take 500 mg by mouth daily.  [provider]  Active Self           Med Note Broadus John, REGEENA   Thu Jan 14, 2020  2:19 PM)    albuterol (PROVENTIL) (2.5 MG/3ML) 0.083% nebulizer solution [id number]  Take 3 mLs (2.5 mg total) by nebulization every 6 (six) hours as needed for wheezing or shortness of breath. Chesley Mires, MD  Active   albuterol (VENTOLIN HFA) 108 (90 Base) MCG/ACT inhaler [id number]  Inhale 1-2 puffs into the lungs every 4 (four) hours as needed for wheezing or shortness of breath. [name], MD  Active Self  aspirin EC 81 MG tablet [id number]  Take 1 tablet (81 mg total) by mouth daily. Rosalin Hawking, MD  Active Self  baclofen (LIORESAL) 10 MG tablet [id number]  Take 1 tablet (10 mg total) by mouth 2 (two) times daily. [name], MD  Active   doxycycline (VIBRAMYCIN) 100 MG capsule [id number]  Take 1 capsule (100 mg total) by mouth 2 (two) times daily. Chesley Mires, MD  Active   gabapentin (NEURONTIN) 300 MG capsule [id number]  TAKE 1 CAPSULE BY MOUTH THREE TIMES DAILY [name], MD  Active   nystatin (MYCOSTATIN) 100000 UNIT/ML suspension [id number]  Take 5 mLs (500,000 Units total) by mouth in the morning, at noon, and at bedtime. [name], MD  Active   omeprazole (PRILOSEC) 40 MG capsule [id number]  Take 1 capsule by mouth once daily [name], MD  Active    pravastatin (PRAVACHOL) 20 MG tablet [id number]  Take 1 tablet by mouth once daily [name], MD  Active   psyllium (METAMUCIL) 58.6 % packet [id number]  Take 1 packet by mouth daily. [provider]  Active   traMADol (ULTRAM) 50 MG tablet [id number]  TAKE 1 TABLET BY MOUTH ONCE DAILY AS NEEDED [name], MD  Active   umeclidinium-vilanterol [facility] ELLIPTA) 62.5-25 MCG/INH AEPB [id number]  Inhale 1 puff into the lungs daily. 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    Class: Acute   Cecum mass    Mass in rectum    PVD (peripheral vascular disease) ([place]) 09/14/2016   Essential hypertension 12/20/2014   Routine general medical examination at a health care facility 10/22/2014   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) 08/04/2014   Peripheral arterial disease ([place]) 01/05/2014   Osteopenia 11/12/2013   Hyperlipidemia associated with type 2 diabetes mellitus ([place]) 09/19/2013   Esophageal stricture 12/23/2012   Diabetes mellitus type 2, controlled, 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[place]) 64/15/8309    Immunization History  Administered Date(s) Administered   Fluad Quad(high Dose 65+) 06/18/2019, 07/13/2020, 07/19/2021   Influenza, High Dose Seasonal PF 07/27/2013, 05/14/2016, 05/20/2017, 06/05/2018   Influenza,inj,Quad PF,6+ Mos 08/03/2014, 06/21/2015   Influenza,inj,quad, With Preservative 06/04/2019   Influenza-Unspecified 07/04/2012   PFIZER(Purple Top)SARS-COV-2 Vaccination 11/17/2019, 12/08/2019, 08/17/2020   Pneumococcal Conjugate-13 11/11/2015    Pneumococcal-Unspecified 09/03/2010   Td 09/03/2012    Conditions to be addressed/monitored:  Hypertension, Hyperlipidemia, Diabetes, COPD and Osteoarthritis, Thrush, Neuropathy  There are no care plans that you recently modified to display for this patient.      Medication Assistance: None required.  Patient affirms current coverage meets needs.  Compliance/Adherence/Medication fill history: Care Gaps: Shingrix Covid booster (11/15/20) Eye exam (09/17/16) Hepatitis C screening A1c due (01/10/21)  Star-Rating Drugs: Pravastatin - LF 01/17/21 x 90 ds  Patient's preferred pharmacy is:  [facility], [address] [place] [address] Phone: [phone] Fax: [phone]   Uses pill box? Yes Pt endorses 100% compliance  We discussed: Current pharmacy is preferred with insurance plan and patient is satisfied with pharmacy services Patient decided to: Continue current medication management strategy  Care Plan and Follow Up Patient Decision:  Patient agrees to Care Plan and Follow-up.  Plan: Telephone follow up appointment with care management team member scheduled for:  6 months  ***

## 2021-10-10 NOTE — Progress Notes (Signed)
Synopsis: Referred in 2017 for COPD by [name], *  Subjective:   PATIENT ID: Sherry Lambert DOB: [birth year], MRN: [id number]  Chief Complaint  Patient presents with   Follow-up    SOBE   Sherry Lambert is a 81 year old Lambert former smoker with COPD, hx CVA in 2016 who presents for follow-up.   Synopsis: She is a former patient of Dr. Carlis Abbott. She was diagnosed with COPD in 2016. Her last COPD exacerbation was one year ago Feb 2021 requiring hospitalization. She transitioned from Trelegy to [facility] in March 2022 due to thrush and reports symptoms remain well-controlled.  12/20/20 Since our last visit, she reports her symptoms are well contolled on Anoro. [name] has resolved off inhaled corticosteroids. After sitting three hours outside, she used her Albuterol yesterday for shortness of breath and nasal congestion and believes this is related to the pollen. Otherwise she does routinely use her rescue inhaler. Denies wheezing or cough.  10/10/21 Since our last visit she is compliant with Anoro. She is ambulatory with a walker at home. In the last two weeks she has had worsening shortness of breath and has had to use albuterol once a day. No coughing or wheezing. No fevers or chills. Her last exacerbation was in 05/2021 which was treated with doxycycline. She will do 30 min exercise twice a week with a TV program and the exercises will vary aerobic to strengthening.  Prior inhalers: Trelegy - thrush  Social history: Quit in 2004. 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   [name]    Cataract    Chronic kidney disease    kidney stone   Colitis    Colon cancer ([place]) 06/2017   cecum   Diabetes mellitus without complication ([place])    no meds taken   Emphysema of lung ([facility])    GERD (gastroesophageal reflux disease)    Hyperlipidemia    Hypertension    Peripheral arterial disease ([place])     [name]    Stroke [facility])    November 2016- Thanksgiving night     Allergies  Allergen Reactions   Lipitor [Atorvastatin] Itching     Outpatient Medications Prior to Visit  Medication Sig Dispense Refill   acetaminophen (TYLENOL) 500 MG tablet Take 500 mg by mouth daily.      albuterol (PROVENTIL) (2.5 MG/3ML) 0.083% nebulizer solution Take 3 mLs (2.5 mg total) by nebulization every 6 (six) hours as needed for wheezing or shortness of breath. 360 mL 5   albuterol (VENTOLIN HFA) 108 (90 Base) MCG/ACT inhaler Inhale 1-2 puffs into the lungs every 4 (four) hours as needed for wheezing or shortness of breath. 6.7 g 6   aspirin EC 81 MG tablet Take 1 tablet (81 mg total) by mouth daily. 90 tablet 3   baclofen (LIORESAL) 10 MG tablet Take 1 tablet (10 mg total) by mouth 2 (two) times daily. 60 each 0   gabapentin (NEURONTIN) 300 MG capsule TAKE 1 CAPSULE BY MOUTH THREE TIMES DAILY 270 capsule 1   omeprazole (PRILOSEC) 40 MG capsule Take 1 capsule by mouth once daily 90 capsule 1   pravastatin (PRAVACHOL) 20 MG tablet Take 1 tablet by mouth once daily 90 tablet 0   psyllium (METAMUCIL) 58.6 % packet Take 1 packet by mouth daily.     traMADol (ULTRAM) 50 MG tablet TAKE 1 TABLET BY MOUTH ONCE DAILY AS NEEDED  30 tablet 5   umeclidinium-vilanterol (ANORO ELLIPTA) 62.5-25 MCG/INH AEPB Inhale 1 puff into the lungs daily. 30 each 11   doxycycline (VIBRAMYCIN) 100 MG capsule Take 1 capsule (100 mg total) by mouth 2 (two) times daily. (Patient not taking: Reported on 10/10/2021) 14 capsule 0   nystatin (MYCOSTATIN) 100000 UNIT/ML suspension Take 5 mLs (500,000 Units total) by mouth in the morning, at noon, and at bedtime. (Patient not taking: Reported on 10/10/2021) 60 mL 0   No facility-administered medications prior to visit.    Review of Systems  Constitutional:  Negative for chills, diaphoresis, fever, malaise/fatigue and weight loss.  HENT:  Negative for congestion.   Respiratory:   Positive for shortness of breath. Negative for cough, hemoptysis, sputum production and wheezing.   Cardiovascular:  Negative for chest pain, palpitations and leg swelling.    Objective:   Vitals:   10/10/21 1108  BP: 130/70  Pulse: 81  Temp: 98.1 F (36.7 C)  TempSrc: Oral  SpO2: 93%  Weight: 113 lb (51.3 kg)  Height: 4\' 11"  (1.499 m)   93%  BMI Readings from Last 3 Encounters:  10/10/21 22.82 kg/m  08/09/21 23.03 kg/m  05/22/21 22.50 kg/m   Wt Readings from Last 3 Encounters:  10/10/21 113 lb (51.3 kg)  08/09/21 114 lb (51.7 kg)  05/22/21 111 lb 6.4 oz (50.5 kg)   Physical Exam: General: Frail-appearing, no acute distress HENT: [place], AT Eyes: EOMI, no scleral icterus Respiratory: Diminished breath sounds bilaterally.  No crackles, wheezing or rales Cardiovascular: RRR, -M/R/G, no JVD Extremities:-Edema,-tenderness Neuro: AAO x4, CNII-XII grossly intact Psych: Normal mood, normal affect  Chest Imaging- films reviewed: CTA Chest 10/27/2019-emphysema, bilateral dependent pleural effusions.  No PE, no significant mediastinal or hilar adenopathy.  Hernia with stomach and abdominal fat contents protruding into the mediastinum. CXR 05/22/21 - COPD. No infiltrate effusion or edema  Pulmonary Functions Testing Results: PFT Results Latest Ref Rng & Units 11/01/2020 02/13/2016  FVC-Pre L 1.82 1.82  FVC-Predicted Pre % 82 76  FVC-Post L 1.80 2.02  FVC-Predicted Post % 81 84  Pre FEV1/FVC % % 48 45  Post FEV1/FCV % % 46 47  FEV1-Pre L 0.87 0.82  FEV1-Predicted Pre % 52 46  FEV1-Post L 0.83 0.94  DLCO uncorrected ml/min/mmHg 9.89 9.75  DLCO UNC% % 60 51  DLCO corrected ml/min/mmHg 9.89 9.26  DLCO COR %Predicted % 60 49  DLVA Predicted % 80 64  TLC L 5.04 -  TLC % Predicted % 113 -  RV % Predicted % 159 -   2017- severe obstruction with out significant bronchodilator reversibility.  Moderate diffusion impairment.  Flow volume loop supports obstruction.  2019  spirometry: FVC 1.8 (75%) FEV1 0.7 (39%) Ratio 38%  PFT 11/01/20 FVC 1.8 (81%) FEV1 0.83 (50%) Ratio 48  TLC 113% RV 159% DLCO 60% Interpretation: Moderately severe obstructive defect with air trapping and reduced DLCO consistent with emphysema.     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 On LABA/LAMA due to thrush. In mild exacerbation but usually controlled on current regimen. Discussed clinical course and management of COPD/asthma including bronchodilator regimen and action plan for exacerbation.   COPD with emphysema - overall well-controlled, in exacerbation --CONTINUE Anoro ONE puff ONCE a day --CONTINUE Albuterol as needed for shortness of breath or wheezing. REFILL --START prednisone 30 mg for five days  Allergic Rhinitis - improved --Claritin as needed  Health Maintenance Immunization History  Administered  Date(s) Administered   Fluad Quad(high Dose 65+) 06/18/2019, 07/13/2020, 07/19/2021   Influenza, High Dose Seasonal PF 07/27/2013, 05/14/2016, 05/20/2017, 06/05/2018   Influenza,inj,Quad PF,6+ Mos 08/03/2014, 06/21/2015   Influenza,inj,quad, With Preservative 06/04/2019   Influenza-Unspecified 07/04/2012   PFIZER(Purple Top)SARS-COV-2 Vaccination 11/17/2019, 12/08/2019, 08/17/2020   Pneumococcal Conjugate-13 11/11/2015   Pneumococcal-Unspecified 09/03/2010   Td 09/03/2012   CT Lung Screening - not a candidate. Quit smoking > 15 years ago  I have spent a total time of 35-minutes on the day of the appointment reviewing prior documentation, coordinating care and discussing medical diagnosis and plan with the patient/family. Past medical history, allergies, medications were reviewed. Pertinent imaging, labs and tests included in this note have been reviewed and interpreted independently by me.  Rikki Smestad Rodman Pickle, MD [facility] 10/10/2021 11:27 AM

## 2021-10-10 NOTE — Patient Instructions (Signed)
COPD with emphysema - overall well-controlled, in exacerbation --CONTINUE Anoro ONE puff ONCE a day --CONTINUE Albuterol as needed for shortness of breath or wheezing. REFILL --START prednisone 30 mg for five days  Follow-up with in 4 months

## 2021-10-16 ENCOUNTER — Telehealth: Payer: Self-pay | Admitting: Internal Medicine

## 2021-10-16 NOTE — Telephone Encounter (Signed)
Pt checking status of provider's response, informed pt provider stated the gel is safe  Pt expressed understanding

## 2021-10-16 NOTE — Telephone Encounter (Signed)
Pt inquiring if Voltaren Arthritis Pain Relief Gel  is safe to use on her knee  Pt requesting a c/b

## 2021-10-16 NOTE — Telephone Encounter (Signed)
See below

## 2021-10-16 NOTE — Telephone Encounter (Signed)
Yes this is safe.

## 2021-10-28 DIAGNOSIS — J189 Pneumonia, unspecified organism: Secondary | ICD-10-CM | POA: Diagnosis not present

## 2021-10-28 DIAGNOSIS — J449 Chronic obstructive pulmonary disease, unspecified: Secondary | ICD-10-CM | POA: Diagnosis not present

## 2021-11-03 DIAGNOSIS — J449 Chronic obstructive pulmonary disease, unspecified: Secondary | ICD-10-CM | POA: Diagnosis not present

## 2021-11-03 DIAGNOSIS — J189 Pneumonia, unspecified organism: Secondary | ICD-10-CM | POA: Diagnosis not present

## 2021-11-09 ENCOUNTER — Telehealth: Payer: Self-pay

## 2021-11-09 NOTE — Chronic Care Management (AMB) (Signed)
? ? ?Chronic Care Management ?Pharmacy Assistant  ? ?Name: Sherry Lambert  MRN: [id number] DOB: Feb 22, 1941 ? ?Sherry Lambert is an 81 y.o. year old female who presents for his follow-up CCM visit with the clinical pharmacist. ? ?Reason for Encounter: Disease State-General ?  ? ?Recent office visits:  ?08/09/21 [name], MD-PCP (Routine medical exam) Blood work ordered, Medication changes: Baclofen 10 mg ? ?Recent consult visits:  ?10/10/21 [name], MD-Pulmonary Disease (COPD) No orders, Medication changes: START prednisone 30 mg for five days ? ?Hospital visits:  ?None in previous 6 months ? ?Medications: ?Outpatient Encounter Medications as of 11/09/2021  ?Medication Sig  ? acetaminophen (TYLENOL) 500 MG tablet Take 500 mg by mouth daily.   ? albuterol (PROVENTIL) (2.5 MG/3ML) 0.083% nebulizer solution Take 3 mLs (2.5 mg total) by nebulization every 6 (six) hours as needed for wheezing or shortness of breath.  ? albuterol (VENTOLIN HFA) 108 (90 Base) MCG/ACT inhaler Inhale 1-2 puffs into the lungs every 4 (four) hours as needed for wheezing or shortness of breath.  ? aspirin EC 81 MG tablet Take 1 tablet (81 mg total) by mouth daily.  ? baclofen (LIORESAL) 10 MG tablet Take 1 tablet (10 mg total) by mouth 2 (two) times daily.  ? doxycycline (VIBRAMYCIN) 100 MG capsule Take 1 capsule (100 mg total) by mouth 2 (two) times daily. (Patient not taking: Reported on 10/10/2021)  ? gabapentin (NEURONTIN) 300 MG capsule TAKE 1 CAPSULE BY MOUTH THREE TIMES DAILY  ? nystatin (MYCOSTATIN) 100000 UNIT/ML suspension Take 5 mLs (500,000 Units total) by mouth in the morning, at noon, and at bedtime. (Patient not taking: Reported on 10/10/2021)  ? omeprazole (PRILOSEC) 40 MG capsule Take 1 capsule by mouth once daily  ? pravastatin (PRAVACHOL) 20 MG tablet Take 1 tablet by mouth once daily  ? psyllium (METAMUCIL) 58.6 % packet Take 1 packet by mouth daily.  ? traMADol (ULTRAM) 50 MG tablet TAKE 1 TABLET BY MOUTH ONCE  DAILY AS NEEDED  ? umeclidinium-vilanterol (ANORO ELLIPTA) 62.5-25 MCG/INH AEPB Inhale 1 puff into the lungs daily.  ? ?No facility-administered encounter medications on file as of 11/09/2021.  ? ?Have you had any problems recently with your health?Patient states that she is doing well. She states that every now and then she has achilles tendon and has tramadol but it does not help at all. She also states that when she gets up to go to the bathroom to urinate that it come out slow all the time but at night when she gets up to go comes fast. Patient states that she has mentioned this to Dr. [name] but she does not reply. ? ?Have you had any problems with your pharmacy?Patient states that she does not have any problems with getting medications from the pharmacy. She states that her inhales cost her about $40 but that she will continue to pay ? ?What issues or side effects are you having with your medications?Patient states that she does not have any side effects from medications  ? ?What would you like me to pass along to [facility] Pharmacist, for them to help you with? Patient states that she is doing ok ? ?What can we do to take care of you better? Patient states she does not need anything at this time ? ?Care Gaps: ?Colonoscopy-NA ?Diabetic Foot Exam-08/09/21 ?Mammogram-aged out ?Ophthalmology-09/18/15 ?Dexa Scan - Declined ?Annual Well Visit - 08/09/21 ?Micro albumin-NA ?Hemoglobin A1c- NA ? ?Star Rating Drugs: ?Pravastatin 20 mg-last  fill 10/02/21 90 ds ? ?[name] ?Clinical Pharmacist Assistant ?[phone]  ?

## 2021-11-25 DIAGNOSIS — J449 Chronic obstructive pulmonary disease, unspecified: Secondary | ICD-10-CM | POA: Diagnosis not present

## 2021-11-25 DIAGNOSIS — J189 Pneumonia, unspecified organism: Secondary | ICD-10-CM | POA: Diagnosis not present

## 2021-12-01 ENCOUNTER — Other Ambulatory Visit: Payer: Self-pay | Admitting: *Deleted

## 2021-12-01 DIAGNOSIS — I75023 Atheroembolism of bilateral lower extremities: Secondary | ICD-10-CM

## 2021-12-04 DIAGNOSIS — J189 Pneumonia, unspecified organism: Secondary | ICD-10-CM | POA: Diagnosis not present

## 2021-12-04 DIAGNOSIS — J449 Chronic obstructive pulmonary disease, unspecified: Secondary | ICD-10-CM | POA: Diagnosis not present

## 2021-12-07 ENCOUNTER — Ambulatory Visit (HOSPITAL_COMMUNITY)
Admission: RE | Admit: 2021-12-07 | Discharge: 2021-12-07 | Disposition: A | Discharge status: Home / Self Care | Payer: Medicare HMO | Source: Ambulatory Visit | Attending: Physician Assistant | Admitting: Physician Assistant

## 2021-12-07 ENCOUNTER — Ambulatory Visit: Payer: Medicare HMO | Admitting: Physician Assistant

## 2021-12-07 VITALS — BP 132/70 | HR 71 | Temp 97.6°F | Resp 20 | Ht 59.0 in | Wt 113.5 lb

## 2021-12-07 DIAGNOSIS — I75023 Atheroembolism of bilateral lower extremities: Secondary | ICD-10-CM

## 2021-12-07 DIAGNOSIS — I878 Other specified disorders of veins: Secondary | ICD-10-CM

## 2021-12-07 NOTE — Progress Notes (Signed)
?Office Note  ? ? ? ?CC:  follow up ?Requesting Provider:  [name], * ? ?HPI: Sherry Lambert is a 81 y.o. (11-04-1940) female who presents for surveillance of PAD.  She presented with toe discoloration several years ago with concern for ischemia.  It was determined that toe discoloration was more likely related to venous congestion and she has been in the surveillance protocol since.  She denies any further tissue changes or rest pain of bilateral lower extremities.  She is ambulatory with a walker.  Mobility is limited by osteoarthritis of her right knee.  She is a former smoker.  She is on aspirin and statin.  Patient states she wears knee-high compression daily and props her legs up to manage edema. ? ?Past Medical History:  ?Diagnosis Date  ? 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 [name]   ? Cataract   ? Chronic kidney disease   ? kidney stone  ? Colitis   ? Colon cancer ([facility]) 06/2017  ? cecum  ? Diabetes mellitus without complication ([place])   ? no meds taken  ? Emphysema of lung ([place])   ? GERD (gastroesophageal reflux disease)   ? Hyperlipidemia   ? Hypertension   ? Peripheral arterial disease ([place])   ? [name]   ? Stroke [facility])   ? November 2016- Thanksgiving night  ? ? ?Past Surgical History:  ?Procedure Laterality Date  ? ABDOMINAL HYSTERECTOMY    ? BALLOON DILATION N/A 12/23/2012  ? Procedure: BALLOON DILATION;  Surgeon: Inda Castle, MD;  Location: [facility];  Service: Endoscopy;  Laterality: N/A;  ? BALLOON DILATION N/A 01/15/2013  ? Procedure: BALLOON DILATION;  Surgeon: Inda Castle, MD;  Location: [facility];  Service: Endoscopy;  Laterality: N/A;  ? BALLOON DILATION N/A 07/22/2018  ? Procedure: BALLOON DILATION;  Surgeon: Mauri Pole, MD;  [facility];  Service: Endoscopy;  Laterality: N/A;  ? cataract    ? both eyes  ? COLONOSCOPY    ? COLONOSCOPY WITH PROPOFOL N/A 03/05/2017  ? Procedure: COLONOSCOPY WITH PROPOFOL;  Surgeon: Mauri Pole, MD;  [facility];  Service: Endoscopy;  Laterality: N/A;  ? DENTAL RESTORATION/EXTRACTION WITH X-RAY    ? ESOPHAGOGASTRODUODENOSCOPY N/A 12/23/2012  ? Procedure: ESOPHAGOGASTRODUODENOSCOPY (EGD);  Surgeon: Inda Castle, MD;  Location: [facility];  Service: Endoscopy;  Laterality: N/A;  ? ESOPHAGOGASTRODUODENOSCOPY N/A 01/15/2013  ? Procedure: ESOPHAGOGASTRODUODENOSCOPY (EGD);  Surgeon: Inda Castle, MD;  Location: [facility];  Service: Endoscopy;  Laterality: N/A;  ? ESOPHAGOGASTRODUODENOSCOPY (EGD) WITH PROPOFOL N/A 07/22/2018  ? Procedure: ESOPHAGOGASTRODUODENOSCOPY (EGD) WITH PROPOFOL;  Surgeon: Mauri Pole, MD;  [facility];  Service: Endoscopy;  Laterality: N/A;  ? TONSILLECTOMY    ? ? ?Social History  ? ?Socioeconomic History  ? Marital status: Divorced  ?  Spouse name: Not on file  ? Number of children: 1  ? Years of education: Not on file  ? Highest education level: Not on file  ?Occupational History  ? Occupation: Retired  ?  Comment: [employer]  ?Tobacco Use  ? Smoking status: Former  ?  Packs/day: 1.00  ?  Years: 55.00  ?  Pack years: 55.00  ?  Types: Cigarettes  ?  Quit date: 10/19/2002  ?  Years since quitting: 19.1  ? Smokeless tobacco: Never  ?Vaping Use  ? Vaping Use: Never used  ?Substance and Sexual Activity  ? Alcohol use: No  ?  Alcohol/week:  0.0 standard drinks  ? Drug use: No  ? Sexual activity: Never  ?  Birth control/protection: Surgical  ?Other Topics Concern  ? Not on file  ?Social History Narrative  ? Regular exercise-no  ? Caffeine Use-yes  ? ?Social Determinants of Health  ? ?Financial Resource Strain: Low Risk   ? Difficulty of Paying Living Expenses: Not hard at all  ?Food Insecurity: No Food Insecurity  ? Worried About Charity fundraiser in the Last Year: Never true  ? Ran Out of Food in the Last Year: Never true  ?Transportation Needs: No Transportation Needs  ? Lack of Transportation (Medical): No  ? Lack of Transportation  (Non-Medical): No  ?Physical Activity: Insufficiently Active  ? Days of Exercise per Week: 7 days  ? Minutes of Exercise per Session: 20 min  ?Stress: No Stress Concern Present  ? Feeling of Stress : Not at all  ?Social Connections: Socially Isolated  ? Frequency of Communication with Friends and Family: More than three times a week  ? Frequency of Social Gatherings with Friends and Family: More than three times a week  ? Attends Religious Services: Never  ? Active Member of Clubs or Organizations: No  ? Attends Archivist Meetings: Never  ? Marital Status: Widowed  ?Intimate Partner Violence: Not At Risk  ? Fear of Current or Ex-Partner: No  ? Emotionally Abused: No  ? Physically Abused: No  ? Sexually Abused: No  ? ? ?Family History  ?Problem Relation Age of Onset  ? Hypertension Father   ? Diabetes Father   ? Cancer Father   ?     melanoma  ? Emphysema Mother   ? Colon cancer Sister 75  ? Cancer Sister   ?     colon, surgery alone   ? Emphysema Sister   ? Early death Neg Hx   ? Stroke Neg Hx   ? Esophageal cancer Neg Hx   ? Rectal cancer Neg Hx   ? Stomach cancer Neg Hx   ? ? ?Current Outpatient Medications  ?Medication Sig Dispense Refill  ? acetaminophen (TYLENOL) 500 MG tablet Take 500 mg by mouth daily.     ? albuterol (PROVENTIL) (2.5 MG/3ML) 0.083% nebulizer solution Take 3 mLs (2.5 mg total) by nebulization every 6 (six) hours as needed for wheezing or shortness of breath. 360 mL 5  ? albuterol (VENTOLIN HFA) 108 (90 Base) MCG/ACT inhaler Inhale 1-2 puffs into the lungs every 4 (four) hours as needed for wheezing or shortness of breath. 6.7 g 6  ? aspirin EC 81 MG tablet Take 1 tablet (81 mg total) by mouth daily. 90 tablet 3  ? baclofen (LIORESAL) 10 MG tablet Take 1 tablet (10 mg total) by mouth 2 (two) times daily. 60 each 0  ? doxycycline (VIBRAMYCIN) 100 MG capsule Take 1 capsule (100 mg total) by mouth 2 (two) times daily. 14 capsule 0  ? gabapentin (NEURONTIN) 300 MG capsule TAKE 1 CAPSULE  BY MOUTH THREE TIMES DAILY 270 capsule 1  ? nystatin (MYCOSTATIN) 100000 UNIT/ML suspension Take 5 mLs (500,000 Units total) by mouth in the morning, at noon, and at bedtime. 60 mL 0  ? omeprazole (PRILOSEC) 40 MG capsule Take 1 capsule by mouth once daily 90 capsule 1  ? pravastatin (PRAVACHOL) 20 MG tablet Take 1 tablet by mouth once daily 90 tablet 0  ? psyllium (METAMUCIL) 58.6 % packet Take 1 packet by mouth daily.    ? traMADol (ULTRAM) 50  MG tablet TAKE 1 TABLET BY MOUTH ONCE DAILY AS NEEDED 30 tablet 5  ? umeclidinium-vilanterol (ANORO ELLIPTA) 62.5-25 MCG/INH AEPB Inhale 1 puff into the lungs daily. 30 each 11  ? ?No current facility-administered medications for this visit.  ? ? ?Allergies  ?Allergen Reactions  ? Lipitor [Atorvastatin] Itching  ? ? ? ?REVIEW OF SYSTEMS:  ? ?'[X]'$  denotes positive finding, '[ ]'$  denotes negative finding ?Cardiac  Comments:  ?Chest pain or chest pressure:    ?[name] upon exertion:    ?Short of breath when lying flat:    ?Irregular heart rhythm:    ?    ?Vascular    ?Pain in calf, thigh, or hip brought on by ambulation:    ?Pain in feet at night that wakes you up from your sleep:     ?Blood clot in your veins:    ?Leg swelling:     ?    ?Pulmonary    ?Oxygen at home:    ?Productive cough:     ?Wheezing:     ?    ?Neurologic    ?Sudden weakness in arms or legs:     ?Sudden numbness in arms or legs:     ?Sudden onset of difficulty speaking or slurred speech:    ?Temporary loss of vision in one eye:     ?Problems with dizziness:     ?    ?Gastrointestinal    ?[name]:     ?Vomited blood:     ?    ?Genitourinary    ?Burning when urinating:     ?Blood in urine:    ?    ?Psychiatric    ?Major depression:     ?    ?Hematologic    ?Bleeding problems:    ?Problems with blood clotting too easily:    ?    ?Skin    ?Rashes or ulcers:    ?    ?Constitutional    ?Fever or chills:    ? ? ?PHYSICAL EXAMINATION: ? ?Vitals:  ? 12/07/21 1444  ?BP: 132/70  ?Pulse: 71  ?Resp: 20   ?Temp: 97.6 ?F (36.4 ?C)  ?TempSrc: Temporal  ?SpO2: 95%  ?Weight: 113 lb 8 oz (51.5 kg)  ?Height: '4\' 11"'$  (1.499 m)  ? ? ?General:  WDWN in NAD; vital signs documented above ?Gait: Not observed ?HENT: WNL, normoc

## 2021-12-14 ENCOUNTER — Other Ambulatory Visit: Payer: Self-pay | Admitting: Internal Medicine

## 2021-12-22 ENCOUNTER — Telehealth: Payer: Self-pay | Admitting: Internal Medicine

## 2021-12-22 MED ORDER — TRAMADOL HCL 50 MG PO TABS: 50.0000 mg | ORAL_TABLET | Freq: Every day | ORAL | 5 refills | Status: DC | PRN

## 2021-12-22 NOTE — Telephone Encounter (Signed)
1.Medication Requested: traMADol (ULTRAM) 50 MG tablet ? ?2. Pharmacy (Name, [facility], [place]): [facility], [place] ? ?3. On Med List: Y ? ?4. Last Visit with PCP: 08-09-2021 ? ?5. Next visit date with PCP: n/a ? ? ?Agent: Please be advised that RX refills may take up to 3 business days. We ask that you follow-up with your pharmacy.  ?

## 2021-12-26 DIAGNOSIS — J449 Chronic obstructive pulmonary disease, unspecified: Secondary | ICD-10-CM | POA: Diagnosis not present

## 2021-12-26 DIAGNOSIS — J189 Pneumonia, unspecified organism: Secondary | ICD-10-CM | POA: Diagnosis not present

## 2022-01-03 DIAGNOSIS — J189 Pneumonia, unspecified organism: Secondary | ICD-10-CM | POA: Diagnosis not present

## 2022-01-03 DIAGNOSIS — J449 Chronic obstructive pulmonary disease, unspecified: Secondary | ICD-10-CM | POA: Diagnosis not present

## 2022-01-16 DIAGNOSIS — M1711 Unilateral primary osteoarthritis, right knee: Secondary | ICD-10-CM | POA: Diagnosis not present

## 2022-01-18 ENCOUNTER — Other Ambulatory Visit: Payer: Self-pay | Admitting: Internal Medicine

## 2022-01-22 ENCOUNTER — Telehealth: Payer: Self-pay

## 2022-01-22 NOTE — Telephone Encounter (Signed)
Pt is wanting to reschedule appt to a morning time.

## 2022-01-25 ENCOUNTER — Telehealth: Payer: Medicare HMO

## 2022-01-25 DIAGNOSIS — J449 Chronic obstructive pulmonary disease, unspecified: Secondary | ICD-10-CM | POA: Diagnosis not present

## 2022-01-25 DIAGNOSIS — J189 Pneumonia, unspecified organism: Secondary | ICD-10-CM | POA: Diagnosis not present

## 2022-02-03 DIAGNOSIS — J449 Chronic obstructive pulmonary disease, unspecified: Secondary | ICD-10-CM | POA: Diagnosis not present

## 2022-02-03 DIAGNOSIS — J189 Pneumonia, unspecified organism: Secondary | ICD-10-CM | POA: Diagnosis not present

## 2022-02-04 ENCOUNTER — Observation Stay (HOSPITAL_COMMUNITY)
Admission: EM | Admit: 2022-02-04 | Discharge: 2022-02-06 | Disposition: A | Discharge status: Home Health | Payer: Medicare HMO | Attending: Internal Medicine | Admitting: Internal Medicine

## 2022-02-04 ENCOUNTER — Emergency Department (HOSPITAL_COMMUNITY): Payer: Medicare HMO

## 2022-02-04 ENCOUNTER — Telehealth: Payer: Self-pay | Admitting: Internal Medicine

## 2022-02-04 ENCOUNTER — Other Ambulatory Visit: Payer: Self-pay

## 2022-02-04 ENCOUNTER — Encounter (HOSPITAL_COMMUNITY): Payer: Self-pay

## 2022-02-04 DIAGNOSIS — R0602 Shortness of breath: Secondary | ICD-10-CM | POA: Diagnosis not present

## 2022-02-04 DIAGNOSIS — Z8673 Personal history of transient ischemic attack (TIA), and cerebral infarction without residual deficits: Secondary | ICD-10-CM | POA: Diagnosis not present

## 2022-02-04 DIAGNOSIS — Z79899 Other long term (current) drug therapy: Secondary | ICD-10-CM | POA: Insufficient documentation

## 2022-02-04 DIAGNOSIS — I129 Hypertensive chronic kidney disease with stage 1 through stage 4 chronic kidney disease, or unspecified chronic kidney disease: Secondary | ICD-10-CM | POA: Diagnosis not present

## 2022-02-04 DIAGNOSIS — E1122 Type 2 diabetes mellitus with diabetic chronic kidney disease: Secondary | ICD-10-CM | POA: Insufficient documentation

## 2022-02-04 DIAGNOSIS — J189 Pneumonia, unspecified organism: Secondary | ICD-10-CM | POA: Diagnosis not present

## 2022-02-04 DIAGNOSIS — Z87891 Personal history of nicotine dependence: Secondary | ICD-10-CM | POA: Diagnosis not present

## 2022-02-04 DIAGNOSIS — J449 Chronic obstructive pulmonary disease, unspecified: Secondary | ICD-10-CM | POA: Diagnosis present

## 2022-02-04 DIAGNOSIS — R6510 Systemic inflammatory response syndrome (SIRS) of non-infectious origin without acute organ dysfunction: Secondary | ICD-10-CM | POA: Insufficient documentation

## 2022-02-04 DIAGNOSIS — Z85038 Personal history of other malignant neoplasm of large intestine: Secondary | ICD-10-CM | POA: Insufficient documentation

## 2022-02-04 DIAGNOSIS — D72829 Elevated white blood cell count, unspecified: Secondary | ICD-10-CM | POA: Diagnosis not present

## 2022-02-04 DIAGNOSIS — Z20822 Contact with and (suspected) exposure to covid-19: Secondary | ICD-10-CM | POA: Insufficient documentation

## 2022-02-04 DIAGNOSIS — R509 Fever, unspecified: Secondary | ICD-10-CM | POA: Diagnosis not present

## 2022-02-04 DIAGNOSIS — Z7982 Long term (current) use of aspirin: Secondary | ICD-10-CM | POA: Diagnosis not present

## 2022-02-04 DIAGNOSIS — R634 Abnormal weight loss: Secondary | ICD-10-CM | POA: Diagnosis not present

## 2022-02-04 DIAGNOSIS — N189 Chronic kidney disease, unspecified: Secondary | ICD-10-CM | POA: Diagnosis not present

## 2022-02-04 DIAGNOSIS — R918 Other nonspecific abnormal finding of lung field: Secondary | ICD-10-CM | POA: Diagnosis not present

## 2022-02-04 DIAGNOSIS — J439 Emphysema, unspecified: Secondary | ICD-10-CM | POA: Diagnosis not present

## 2022-02-04 LAB — COMPREHENSIVE METABOLIC PANEL
ALT: 16 U/L (ref 0–44)
AST: 19 U/L (ref 15–41)
Albumin: 3.4 g/dL — ABNORMAL LOW (ref 3.5–5.0)
Alkaline Phosphatase: 65 U/L (ref 38–126)
Anion gap: 10 (ref 5–15)
BUN: 17 mg/dL (ref 8–23)
CO2: 23 mmol/L (ref 22–32)
Calcium: 9.3 mg/dL (ref 8.9–10.3)
Chloride: 103 mmol/L (ref 98–111)
Creatinine, Ser: 1.02 mg/dL — ABNORMAL HIGH (ref 0.44–1.00)
GFR, Estimated: 56 mL/min — ABNORMAL LOW (ref >60)
Glucose, Bld: 139 mg/dL — ABNORMAL HIGH (ref 70–99)
Potassium: 4.5 mmol/L (ref 3.5–5.1)
Sodium: 136 mmol/L (ref 135–145)
Total Bilirubin: 0.8 mg/dL (ref 0.3–1.2)
Total Protein: 6.5 g/dL (ref 6.5–8.1)

## 2022-02-04 LAB — CBC WITH DIFFERENTIAL/PLATELET
Abs Immature Granulocytes: 0.3 10*3/uL — ABNORMAL HIGH (ref 0.00–0.07)
Basophils Absolute: 0 10*3/uL (ref 0.0–0.1)
Basophils Relative: 0 %
Eosinophils Absolute: 0 10*3/uL (ref 0.0–0.5)
Eosinophils Relative: 0 %
HCT: 43.5 % (ref 36.0–46.0)
Hemoglobin: 14.1 g/dL (ref 12.0–15.0)
Lymphocytes Relative: 6 %
Lymphs Abs: 2 10*3/uL (ref 0.7–4.0)
MCH: 31.1 pg (ref 26.0–34.0)
MCHC: 32.4 g/dL (ref 30.0–36.0)
MCV: 95.8 fL (ref 80.0–100.0)
Monocytes Absolute: 1 10*3/uL (ref 0.1–1.0)
Monocytes Relative: 3 %
Neutro Abs: 29.3 10*3/uL — ABNORMAL HIGH (ref 1.7–7.7)
Neutrophils Relative %: 90 %
Platelets: 308 10*3/uL (ref 150–400)
Promyelocytes Relative: 1 %
RBC: 4.54 MIL/uL (ref 3.87–5.11)
RDW: 12.4 % (ref 11.5–15.5)
WBC: 32.5 10*3/uL — ABNORMAL HIGH (ref 4.0–10.5)
nRBC: 0 % (ref 0.0–0.2)
nRBC: 0 /100 WBC

## 2022-02-04 LAB — PROTIME-INR
INR: 1 (ref 0.8–1.2)
Prothrombin Time: 13.1 seconds (ref 11.4–15.2)

## 2022-02-04 LAB — SARS CORONAVIRUS 2 BY RT PCR: SARS Coronavirus 2 by RT PCR: NEGATIVE

## 2022-02-04 LAB — LACTIC ACID, PLASMA: Lactic Acid, Venous: 1.6 mmol/L (ref 0.5–1.9)

## 2022-02-04 MED ORDER — ALBUTEROL SULFATE HFA 108 (90 BASE) MCG/ACT IN AERS
1.0000 | INHALATION_SPRAY | RESPIRATORY_TRACT | Status: DC | PRN
Start: 2022-02-04 — End: 2022-02-04

## 2022-02-04 MED ORDER — SODIUM CHLORIDE 0.9 % IV SOLN
100.0000 mg | Freq: Two times a day (BID) | INTRAVENOUS | Status: DC
  Administered 2022-02-04 – 2022-02-05 (×2): 100 mg via INTRAVENOUS
  Filled 2022-02-04 (×2): qty 100

## 2022-02-04 MED ORDER — PRAVASTATIN SODIUM 10 MG PO TABS
20.0000 mg | ORAL_TABLET | Freq: Every day | ORAL | Status: DC
  Administered 2022-02-05 – 2022-02-06 (×2): 20 mg via ORAL
  Filled 2022-02-04 (×2): qty 2

## 2022-02-04 MED ORDER — AZITHROMYCIN 500 MG PO TABS
500.0000 mg | ORAL_TABLET | Freq: Every day | ORAL | Status: DC
  Administered 2022-02-04 – 2022-02-05 (×2): 500 mg via ORAL
  Filled 2022-02-04: qty 2
  Filled 2022-02-04: qty 1

## 2022-02-04 MED ORDER — BACLOFEN 10 MG PO TABS
10.0000 mg | ORAL_TABLET | Freq: Two times a day (BID) | ORAL | Status: DC
  Administered 2022-02-04 – 2022-02-05 (×2): 10 mg via ORAL
  Filled 2022-02-04 (×3): qty 1

## 2022-02-04 MED ORDER — PSYLLIUM 95 % PO PACK
1.0000 | PACK | Freq: Every day | ORAL | Status: DC
  Administered 2022-02-05: 1 via ORAL
  Filled 2022-02-04 (×2): qty 1

## 2022-02-04 MED ORDER — SODIUM CHLORIDE 0.9 % IV BOLUS
30.0000 mL/kg | Freq: Once | INTRAVENOUS | Status: AC
Start: 2022-02-04 — End: 2022-02-04
  Administered 2022-02-04: 1000 mL via INTRAVENOUS

## 2022-02-04 MED ORDER — TRAMADOL HCL 50 MG PO TABS
50.0000 mg | ORAL_TABLET | Freq: Every day | ORAL | Status: DC | PRN
  Administered 2022-02-05 (×2): 50 mg via ORAL
  Filled 2022-02-04 (×2): qty 1

## 2022-02-04 MED ORDER — ALBUTEROL SULFATE (2.5 MG/3ML) 0.083% IN NEBU
2.5000 mg | INHALATION_SOLUTION | Freq: Four times a day (QID) | RESPIRATORY_TRACT | Status: DC | PRN
  Administered 2022-02-05: 2.5 mg via RESPIRATORY_TRACT
  Filled 2022-02-04: qty 3

## 2022-02-04 MED ORDER — ENOXAPARIN SODIUM 30 MG/0.3ML IJ SOSY
30.0000 mg | PREFILLED_SYRINGE | INTRAMUSCULAR | Status: DC
  Administered 2022-02-04 – 2022-02-05 (×2): 30 mg via SUBCUTANEOUS
  Filled 2022-02-04 (×2): qty 0.3

## 2022-02-04 MED ORDER — ASPIRIN 81 MG PO TBEC
81.0000 mg | DELAYED_RELEASE_TABLET | Freq: Every day | ORAL | Status: DC
  Administered 2022-02-05 – 2022-02-06 (×2): 81 mg via ORAL
  Filled 2022-02-04 (×2): qty 1

## 2022-02-04 MED ORDER — ACETAMINOPHEN 500 MG PO TABS
500.0000 mg | ORAL_TABLET | Freq: Every day | ORAL | Status: DC
  Administered 2022-02-05 – 2022-02-06 (×2): 500 mg via ORAL
  Filled 2022-02-04 (×2): qty 1

## 2022-02-04 MED ORDER — GABAPENTIN 300 MG PO CAPS
300.0000 mg | ORAL_CAPSULE | Freq: Three times a day (TID) | ORAL | Status: DC
  Administered 2022-02-04 – 2022-02-06 (×5): 300 mg via ORAL
  Filled 2022-02-04 (×5): qty 1

## 2022-02-04 MED ORDER — UMECLIDINIUM-VILANTEROL 62.5-25 MCG/ACT IN AEPB
1.0000 | INHALATION_SPRAY | Freq: Every day | RESPIRATORY_TRACT | Status: DC
Start: 2022-02-05 — End: 2022-02-06
  Filled 2022-02-04: qty 14

## 2022-02-04 MED ORDER — SODIUM CHLORIDE 0.9 % IV SOLN
1.0000 g | INTRAVENOUS | Status: AC
  Administered 2022-02-05 – 2022-02-06 (×2): 1 g via INTRAVENOUS
  Filled 2022-02-04 (×2): qty 10

## 2022-02-04 MED ORDER — GUAIFENESIN ER 600 MG PO TB12
1200.0000 mg | ORAL_TABLET | Freq: Two times a day (BID) | ORAL | Status: DC
  Administered 2022-02-04 – 2022-02-06 (×4): 1200 mg via ORAL
  Filled 2022-02-04 (×4): qty 2

## 2022-02-04 MED ORDER — SODIUM CHLORIDE 0.9 % IV SOLN
1.0000 g | Freq: Once | INTRAVENOUS | Status: AC
  Administered 2022-02-04: 1 g via INTRAVENOUS
  Filled 2022-02-04: qty 10

## 2022-02-04 MED ORDER — PANTOPRAZOLE SODIUM 40 MG PO TBEC
40.0000 mg | DELAYED_RELEASE_TABLET | Freq: Every day | ORAL | Status: DC
  Administered 2022-02-05 – 2022-02-06 (×2): 40 mg via ORAL
  Filled 2022-02-04 (×2): qty 1

## 2022-02-04 NOTE — ED Triage Notes (Signed)
Family reports patient was dx with pna this am by [facility] in [place].  Patient has high wbc per family.  Had rapid covid which was negative. Labs family brought reports WBC 34.3

## 2022-02-04 NOTE — ED Notes (Signed)
Received verbal report from Elizabeth P RN at this time 

## 2022-02-04 NOTE — H&P (Signed)
History and Physical    Sherry Lambert QZR:[id number] DOB: 04/30/1941 DOA: 02/04/2022  PCP: Sherry Koch, MD (Confirm with patient/family/NH records and if not entered, this has to be entered at [facility] point of entry) Patient coming from: Home  I have personally briefly reviewed patient's old medical records in [place]  Chief Complaint: Fever, SOB  HPI: Sherry Lambert is a 81 y.o. female with medical history significant of COPD Gold stage II, stroke with resting right-sided weakness, esophageal stricture status post dilation, GERD, came with new onset of fever and shortness of breath.  Symptoms started this morning, patient developed fever 101.5, along with new onset of shortness of breath, no cough no wheezing.  She denies any chest pain, no abdominal pain no diarrhea no urinary symptoms.  Then, she went to see her pulmonary doctor, who did her x-ray at office which showed signs of multifocal pneumonia and sent patient to the ED.  Patient has a chronic dysphagia secondary to esophageal stricture underwent several dilations, and the last EGD and esophageal dilation was in 2019 before COVID.  Patient then lost to follow-up during the pandemic.  Patient does report that recently she has had increasing episodes of " food stuck in the middle of the chest" but she managed with drinking extra amounts of water and small bite.  She lost about 4 pounds in last 59-month.  No history of aspiration pneumonia.  ED Course: Afebrile, blood pressure borderline low, no hypoxic.  WBC 30, x-ray showed multifocal pneumonia.  Review of Systems: As per HPI otherwise 14 point review of systems negative.    Past Medical History:  Diagnosis Date   [place] 07/23/[place]   [name]    Cataract    Chronic kidney disease    kidney stone   Colitis    [place]   cecum   Diabetes mellitus without complication ([facility])    no meds taken   Emphysema of lung  ([facility])    GERD (gastroesophageal reflux disease)    Hyperlipidemia    Hypertension    Peripheral arterial disease ([place]    [name]    Stroke ([facility]    November 2016- Thanksgiving night    Past Surgical History:  Procedure Laterality Date   ABDOMINAL HYSTERECTOMY     BALLOON DILATION N/A 12/23/2012   Procedure: BALLOON DILATION;  Surgeon: RInda Castle MD;  [facility];  Service: Endoscopy;  Laterality: N/A;   BALLOON DILATION N/A 01/15/2013   Procedure: BALLOON DILATION;  Surgeon: RInda Castle MD;  [facility];  Service: Endoscopy;  Laterality: N/A;   BALLOON DILATION N/A 07/22/2018   Procedure: BALLOON DILATION;  Surgeon: NMauri Pole MD;  [facility];  Service: Endoscopy;  Laterality: N/A;   cataract     both eyes   COLONOSCOPY     COLONOSCOPY WITH PROPOFOL N/A 03/05/2017   Procedure: COLONOSCOPY WITH PROPOFOL;  Surgeon: NMauri Pole MD;  [facility];  Service: Endoscopy;  Laterality: N/A;   DENTAL RESTORATION/EXTRACTION WITH X-RAY     ESOPHAGOGASTRODUODENOSCOPY N/A 12/23/2012   Procedure: ESOPHAGOGASTRODUODENOSCOPY (EGD);  Surgeon: RInda Castle MD;  [facility];  Service: Endoscopy;  Laterality: N/A;   ESOPHAGOGASTRODUODENOSCOPY N/A 01/15/2013   Procedure: ESOPHAGOGASTRODUODENOSCOPY (EGD);  Surgeon: RInda Castle MD;  [facility];  Service: Endoscopy;  Laterality: N/A;   ESOPHAGOGASTRODUODENOSCOPY (EGD) WITH PROPOFOL N/A 07/22/2018   Procedure: ESOPHAGOGASTRODUODENOSCOPY (EGD) WITH PROPOFOL;  Surgeon: Mauri Pole, MD;  [facility];  Service: Endoscopy;  Laterality: N/A;   TONSILLECTOMY       reports that she quit smoking about 19 years ago. Her smoking use included cigarettes. She has a 55.00 pack-year smoking history. She has never used smokeless tobacco. She reports that she does not drink alcohol and does not use drugs.  Allergies  Allergen Reactions    Lipitor [Atorvastatin] Itching    Family History  Problem Relation Age of Onset   Hypertension Father    Diabetes Father    Cancer Father        melanoma   Emphysema Mother    Colon cancer Sister 74   Cancer Sister        colon, surgery alone    Emphysema Sister    Early death Neg Hx    Stroke Neg Hx    Esophageal cancer Neg Hx    Rectal cancer Neg Hx    Stomach cancer Neg Hx      Prior to Admission medications   Medication Sig Start Date End Date Taking? Authorizing Provider  acetaminophen (TYLENOL) 500 MG tablet Take 500 mg by mouth daily.     [provider]  albuterol (PROVENTIL) (2.5 MG/3ML) 0.083% nebulizer solution Take 3 mLs (2.5 mg total) by nebulization every 6 (six) hours as needed for wheezing or shortness of breath. 05/22/21   Chesley Mires, MD  albuterol (VENTOLIN HFA) 108 (90 Base) MCG/ACT inhaler Inhale 1-2 puffs into the lungs every 4 (four) hours as needed for wheezing or shortness of breath. 10/10/21   [name], MD  aspirin EC 81 MG tablet Take 1 tablet (81 mg total) by mouth daily. 12/20/14   Rosalin Hawking, MD  baclofen (LIORESAL) 10 MG tablet Take 1 tablet (10 mg total) by mouth 2 (two) times daily. 08/09/21   Sherry Koch, MD  doxycycline (VIBRAMYCIN) 100 MG capsule Take 1 capsule (100 mg total) by mouth 2 (two) times daily. 05/22/21   Chesley Mires, MD  gabapentin (NEURONTIN) 300 MG capsule TAKE 1 CAPSULE BY MOUTH THREE TIMES DAILY 12/15/21   Sherry Koch, MD  nystatin (MYCOSTATIN) 100000 UNIT/ML suspension Take 5 mLs (500,000 Units total) by mouth in the morning, at noon, and at bedtime. 04/26/21   Sherry Koch, MD  omeprazole (PRILOSEC) 40 MG capsule Take 1 capsule by mouth once daily 06/22/21   Sherry Koch, MD  pravastatin (PRAVACHOL) 20 MG tablet Take 1 tablet by mouth once daily 01/19/22   Sherry Koch, MD  psyllium (METAMUCIL) 58.6 % packet Take 1 packet by mouth daily.    [provider]   traMADol (ULTRAM) 50 MG tablet Take 1 tablet (50 mg total) by mouth daily as needed. 12/22/21   Sherry Koch, MD  umeclidinium-vilanterol (ANORO ELLIPTA) 62.5-25 MCG/INH AEPB Inhale 1 puff into the lungs daily. 06/16/21   Chesley Mires, MD    Physical Exam: Vitals:   02/04/22 1700 02/04/22 1715 02/04/22 1730 02/04/22 1800  BP: 109/76 (!) 107/49 (!) 112/47 108/62  Pulse: 82 81 81 84  Resp: (!) '22 16 17 '$ (!) 23  Temp:      TempSrc:      SpO2: 95% 96% 96% 97%  Weight:      Height:        Constitutional: NAD, calm, comfortable Vitals:   02/04/22 1700 02/04/22 1715 02/04/22 1730 02/04/22 1800  BP: 109/76 (!) 107/49 (!) 112/47 108/62  Pulse: 82 81  81 84  Resp: (!) '22 16 17 '$ (!) 23  Temp:      TempSrc:      SpO2: 95% 96% 96% 97%  Weight:      Height:       Eyes: PERRL, lids and conjunctivae normal ENMT: Mucous membranes are moist. Posterior pharynx clear of any exudate or lesions.Normal dentition.  Neck: normal, supple, no masses, no thyromegaly Respiratory: clear to auscultation bilaterally, no wheezing, scattered bilateral crackles. Normal respiratory effort. No accessory muscle use.  Cardiovascular: Regular rate and rhythm, no murmurs / rubs / gallops. No extremity edema. 2+ pedal pulses. No carotid bruits.  Abdomen: no tenderness, no masses palpated. No hepatosplenomegaly. Bowel sounds positive.  Musculoskeletal: no clubbing / cyanosis. No joint deformity upper and lower extremities. Good ROM, no contractures. Normal muscle tone.  Skin: no rashes, lesions, ulcers. No induration Neurologic: CN 2-12 grossly intact. Sensation intact, DTR normal. Strength 5/5 in all 4.  Psychiatric: Normal judgment and insight. Alert and oriented x 3. Normal mood.    Labs on Admission: I have personally reviewed following labs and imaging studies  CBC: Recent Labs  Lab 02/04/22 1600  WBC 32.5*  NEUTROABS 29.3*  HGB 14.1  HCT 43.5  MCV 95.8  PLT 937   Basic Metabolic  Panel: Recent Labs  Lab 02/04/22 1600  NA 136  K 4.5  CL 103  CO2 23  GLUCOSE 139*  BUN 17  CREATININE 1.02*  CALCIUM 9.3   GFR: Estimated Creatinine Clearance: 30.5 mL/min (A) (by C-G formula based on SCr of 1.02 mg/dL (H)). Liver Function Tests: Recent Labs  Lab 02/04/22 1600  AST 19  ALT 16  ALKPHOS 65  BILITOT 0.8  PROT 6.5  ALBUMIN 3.4*   No results for input(s): LIPASE, AMYLASE in the last 168 hours. No results for input(s): AMMONIA in the last 168 hours. Coagulation Profile: Recent Labs  Lab 02/04/22 1600  INR 1.0   Cardiac Enzymes: No results for input(s): CKTOTAL, CKMB, CKMBINDEX, TROPONINI in the last 168 hours. BNP (last 3 results) No results for input(s): PROBNP in the last 8760 hours. HbA1C: No results for input(s): HGBA1C in the last 72 hours. CBG: No results for input(s): GLUCAP in the last 168 hours. Lipid Profile: No results for input(s): CHOL, HDL, LDLCALC, TRIG, CHOLHDL, LDLDIRECT in the last 72 hours. Thyroid Function Tests: No results for input(s): TSH, T4TOTAL, FREET4, T3FREE, THYROIDAB in the last 72 hours. Anemia Panel: No results for input(s): VITAMINB12, FOLATE, FERRITIN, TIBC, IRON, RETICCTPCT in the last 72 hours. Urine analysis:    Component Value Date/Time   COLORURINE YELLOW 01/10/2021 1050   APPEARANCEUR [place] (A) 01/10/2021 1050   LABSPEC 1.010 01/10/2021 1050   PHURINE 6.0 01/10/2021 1050   GLUCOSEU NEGATIVE 01/10/2021 1050   HGBUR NEGATIVE 01/10/2021 [place] 01/10/2021 1050   BILIRUBINUR Neg 08/26/2018 1053   KETONESUR NEGATIVE 01/10/2021 1050   PROTEINUR NEGATIVE 01/14/2020 1530   UROBILINOGEN 0.2 01/10/2021 1050   NITRITE NEGATIVE 01/10/2021 1050   LEUKOCYTESUR MODERATE (A) 01/10/2021 1050    Radiological Exams on Admission: DG Chest 2 View  Result Date: 02/04/2022 CLINICAL DATA:  Suspected sepsis. EXAM: CHEST - 2 VIEW COMPARISON:  May 22, 2021 FINDINGS: Bilateral infiltrates are  identified project over the bases on the frontal view. One of these infiltrates is in the base and the other is in either in the right middle lobe or lingula. Stable hiatal hernia. The heart, hila, mediastinum, lungs, and pleura  are otherwise unremarkable. No pneumothorax. IMPRESSION: Bilateral infiltrates consistent with pneumonia given history. Recommend short-term follow-up imaging to ensure resolution. Electronically Signed   By: Dorise Bullion III M.D.   On: 02/04/2022 17:50    EKG: Independently reviewed.  Sinus, no acute ST changes  Assessment/Plan Principal Problem:   Pneumonia Active Problems:   COPD, severe ([facility])   CAP (community acquired pneumonia)  (please populate well all problems here in Problem List. (For example, if patient is on BP meds at home and you resume or decide to hold them, it is a problem that needs to be her. Same for CAD, COPD, HLD and so on)  SIRS -Multifocal pneumonia, highly suspect aspiration pneumonia given strong history of esophagus stricture/dysphagia and has had increasing swallowing symptoms after loss of follow-ups. -Continue ceftriaxone and azithromycin -CT chest to further characterize the pneumonia and rule out any complication such as empyema given the significant leukocytosis of 30,000.  Hx of dysphagia secondary to esophageal stricture -Ordered barium swallow and speech evaluation -Outpatient GI follow-up for repeat EGD.  Explained to the significance of her swallowing symptoms related to current pneumonia, both patient and son agree with appointment with GI ASAP.  COPD -No significant symptoms or signs of acute exacerbation -Continue current breathing treatment regimen, no escalation  Hx of stroke -ASA and statin   Unintentional weight loss -As above  DVT prophylaxis: Lovenox Code Status: Full code Family Communication: Son at bedside Disposition Plan: Expect less than 2 midnight hospital stay Consults called: None Admission  status: MedSurg obs   Lequita Halt MD [facility] Pager [phone]  02/04/2022, 6:40 PM

## 2022-02-04 NOTE — Progress Notes (Signed)
CT chest reviewed.  Multifocal pneumonia confirmed, again suspect aspiration at times. HOB 30 degree and barium swallow ordered.

## 2022-02-04 NOTE — Telephone Encounter (Signed)
  PAtient of Dr Loanne Drilling  Call from daughte rin law Shelle [place] [birth year] -> Known COPD.  Having fever and in an local urgent care was advised needed admission based on CXR with pneumonia. They are on way to [facility] ER. Informed them evaluation first in ER -> home v floor admit v ICU  Informed Dr Francia Greaves of ER     SIGNATURE    Dr. Brand Males, M.D., F.C.C.P,  Pulmonary and Critical Care Medicine Staff Physician, [place] Director - Interstitial Lung Disease  Program  Medical Director - [facility] [place] at [place], [place], [address]  NPI Number:  NPI #[id number] DEA Number: [id number]  Pager: [phone], If no answer  -> Check AMION or Try [phone] Telephone (clinical office): [phone] Telephone (research): [phone]  2:04 PM 02/04/2022       Latest Ref Rng & Units 11/01/2020    3:05 PM 02/13/2016    1:04 PM  PFT Results  FVC-Pre L 1.82   1.82    FVC-Predicted Pre % 82   76    FVC-Post L 1.80   2.02    FVC-Predicted Post % 81   84    Pre FEV1/FVC % % 48   45    Post FEV1/FCV % % 46   47    FEV1-Pre L 0.87   0.82    FEV1-Predicted Pre % 52   46    FEV1-Post L 0.83   0.94    DLCO uncorrected ml/min/mmHg 9.89   9.75    DLCO UNC% % 60   51    DLCO corrected ml/min/mmHg 9.89   9.26    DLCO COR %Predicted % 60   49    DLVA Predicted % 80   64    TLC L 5.04     TLC % Predicted % 113     RV % Predicted % 159

## 2022-02-04 NOTE — ED Provider Notes (Signed)
[facility] EMERGENCY DEPARTMENT Provider Note   CSN: [id number] Arrival date & time: 02/04/22  1458     History  Chief Complaint  Patient presents with   Shortness of Tull[place] is a 81 y.o. female present emerged department shortness of breath.  She reports she is felt short of breath approximately 1 week, began having fevers last night into today.  She went to her doctor's office had an x-ray done was told she has pneumonia and a high white blood cell count, she should come to the hospital.  She is here with her son at bedside.  She reports that she does not smoke, she does get recurring pneumonia about once a year.  She feels well otherwise.  HPI     Home Medications Prior to Admission medications   Medication Sig Start Date End Date Taking? Authorizing Provider  acetaminophen (TYLENOL) 500 MG tablet Take 500 mg by mouth daily.     [provider]  albuterol (PROVENTIL) (2.5 MG/3ML) 0.083% nebulizer solution Take 3 mLs (2.5 mg total) by nebulization every 6 (six) hours as needed for wheezing or shortness of breath. 05/22/21   Chesley Mires, MD  albuterol (VENTOLIN HFA) 108 (90 Base) MCG/ACT inhaler Inhale 1-2 puffs into the lungs every 4 (four) hours as needed for wheezing or shortness of breath. 10/10/21   [name], MD  aspirin EC 81 MG tablet Take 1 tablet (81 mg total) by mouth daily. 12/20/14   Rosalin Hawking, MD  baclofen (LIORESAL) 10 MG tablet Take 1 tablet (10 mg total) by mouth 2 (two) times daily. 08/09/21   [name], MD  doxycycline (VIBRAMYCIN) 100 MG capsule Take 1 capsule (100 mg total) by mouth 2 (two) times daily. 05/22/21   Chesley Mires, MD  gabapentin (NEURONTIN) 300 MG capsule TAKE 1 CAPSULE BY MOUTH THREE TIMES DAILY 12/15/21   [name], MD  nystatin (MYCOSTATIN) 100000 UNIT/ML suspension Take 5 mLs (500,000 Units total) by mouth in the morning, at noon, and at bedtime. 04/26/21   [name], MD  omeprazole (PRILOSEC) 40 MG capsule Take 1 capsule by mouth once daily 06/22/21   [name], MD  pravastatin (PRAVACHOL) 20 MG tablet Take 1 tablet by mouth once daily 01/19/22   [name], MD  psyllium (METAMUCIL) 58.6 % packet Take 1 packet by mouth daily.    [provider]  traMADol (ULTRAM) 50 MG tablet Take 1 tablet (50 mg total) by mouth daily as needed. 12/22/21   [name], MD  umeclidinium-vilanterol (ANORO ELLIPTA) 62.5-25 MCG/INH AEPB Inhale 1 puff into the lungs daily. 06/16/21   Chesley Mires, MD      Allergies    Lipitor [atorvastatin]    Review of Systems   Review of Systems  Physical Exam Updated Vital Signs BP 108/62   Pulse 84   Temp 98.4 F (36.9 C) (Oral)   Resp (!) 23   Ht '4\' 11"'$  (1.499 m)   Wt 51.3 kg   SpO2 97%   BMI 22.82 kg/m  Physical Exam Constitutional:      General: She is not in acute distress. HENT:     Head: Normocephalic and atraumatic.  Eyes:     Conjunctiva/sclera: Conjunctivae normal.     Pupils: Pupils are equal, round, and reactive to light.  Cardiovascular:     Rate and Rhythm: Normal rate and regular rhythm.  Pulmonary:  Effort: Pulmonary effort is normal. No respiratory distress.     Breath sounds: Examination of the left-lower field reveals rhonchi and rales. Rhonchi and rales present.  Abdominal:     General: There is no distension.     Tenderness: There is no abdominal tenderness.  Skin:    General: Skin is warm and dry.  Neurological:     General: No focal deficit present.     Mental Status: She is alert. Mental status is at baseline.  Psychiatric:        Mood and Affect: Mood normal.        Behavior: Behavior normal.    ED Results / Procedures / Treatments   Labs (all labs ordered are listed, but only abnormal results are displayed) Labs Reviewed  COMPREHENSIVE METABOLIC PANEL - Abnormal; Notable for the following components:      Result Value   Glucose, Bld 139  (*)    Creatinine, Ser 1.02 (*)    Albumin 3.4 (*)    GFR, Estimated 56 (*)    All other components within normal limits  CBC WITH DIFFERENTIAL/PLATELET - Abnormal; Notable for the following components:   WBC 32.5 (*)    Neutro Abs 29.3 (*)    Abs Immature Granulocytes 0.30 (*)    All other components within normal limits  SARS CORONAVIRUS 2 BY RT PCR  CULTURE, BLOOD (ROUTINE X 2)  CULTURE, BLOOD (ROUTINE X 2)  EXPECTORATED SPUTUM ASSESSMENT W GRAM STAIN, RFLX TO RESP C  LACTIC ACID, PLASMA  PROTIME-INR  URINALYSIS, ROUTINE W REFLEX MICROSCOPIC  CBC  MYCOPLASMA PNEUMONIAE ANTIBODY, IGM    EKG EKG Interpretation  Date/Time:  Sunday February 04 2022 15:50:14 EDT Ventricular Rate:  83 PR Interval:  142 QRS Duration: 78 QT Interval:  350 QTC Calculation: 411 R Axis:   60 Text Interpretation: Normal sinus rhythm When compared with ECG of 27-Oct-2019 10:44, PREVIOUS ECG IS PRESENT Confirmed by Octaviano Glow [phone]) on 02/04/2022 4:41:55 PM  Radiology DG Chest 2 View  Result Date: 02/04/2022 CLINICAL DATA:  Suspected sepsis. EXAM: CHEST - 2 VIEW COMPARISON:  May 22, 2021 FINDINGS: Bilateral infiltrates are identified project over the bases on the frontal view. One of these infiltrates is in the base and the other is in either in the right middle lobe or lingula. Stable hiatal hernia. The heart, hila, mediastinum, lungs, and pleura are otherwise unremarkable. No pneumothorax. IMPRESSION: Bilateral infiltrates consistent with pneumonia given history. Recommend short-term follow-up imaging to ensure resolution. Electronically Signed   By: Dorise Bullion III M.D.   On: 02/04/2022 17:50   CT CHEST WO CONTRAST  Result Date: 02/04/2022 CLINICAL DATA:  Pneumonia EXAM: CT CHEST WITHOUT CONTRAST TECHNIQUE: Multidetector CT imaging of the chest was performed following the standard protocol without IV contrast. RADIATION DOSE REDUCTION: This exam was performed according to the departmental  dose-optimization program which includes automated exposure control, adjustment of the mA and/or kV according to patient size and/or use of iterative reconstruction technique. COMPARISON:  Previous studies including chest radiograph done earlier today and CT done on 10/27/2019 FINDINGS: Cardiovascular: There are scattered coarse calcifications in the thoracic aorta. Coronary artery calcifications are seen. Mediastinum/Nodes: There are subcentimeter nodes in the mediastinum. There is inhomogeneous attenuation in the thyroid. Lungs/Pleura: Centrilobular and panlobular emphysema is seen. Multiple blebs and bullae are noted in the periphery of right lower lung fields. There are new patchy interstitial infiltrates in the anterior segment of right upper lobe lateral segment of right middle lobe  posterior right lower lobe. There is moderate interstitial infiltrates in the superior segment and posterior basal segments in the left lower lobe. There is no significant pleural effusion. There is no pneumothorax. Left lower lobe. Upper Abdomen: There is moderate sized fixed hiatal hernia. There is possible 2 mm calculus in the midportion of right kidney. Musculoskeletal: Decrease in height of bodies of T12 and L1 vertebrae as not changed significantly. Calcification is seen in the anterior spinal ligament in the upper thoracic spine with interval progression. IMPRESSION: There are new interstitial infiltrates in the right upper lobe, right middle lobe and both lower lobes, more so in the left lower lobe suggesting multifocal pneumonia. There is no significant pleural effusion. 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 Possible 2 mm nonobstructing right renal calculus. Moderate sized fixed hiatal hernia. Coronary artery disease. Other findings as described in the body of the report. Electronically Signed   By: Elmer Picker M.D.   On: 02/04/2022 19:06    Procedures .Critical Care Performed by: Wyvonnia Dusky, MD Authorized by: Wyvonnia Dusky, MD   Critical care provider statement:    Critical care time (minutes):  45   Critical care time was exclusive of:  Separately billable procedures and treating other patients   Critical care was necessary to treat or prevent imminent or life-threatening deterioration of the following conditions:  Sepsis   Critical care was time spent personally by me on the following activities:  Ordering and performing treatments and interventions, ordering and review of laboratory studies, ordering and review of radiographic studies, pulse oximetry, review of old charts, examination of patient and evaluation of patient's response to treatment   Care discussed with: admitting provider      Medications Ordered in ED Medications  doxycycline (VIBRAMYCIN) 100 mg in sodium chloride 0.9 % 250 mL IVPB (100 mg Intravenous New Bag/Given 02/04/22 1725)  acetaminophen (TYLENOL) tablet 500 mg (has no administration in time range)  aspirin EC tablet 81 mg (has no administration in time range)  traMADol (ULTRAM) tablet 50 mg (has no administration in time range)  pravastatin (PRAVACHOL) tablet 20 mg (has no administration in time range)  pantoprazole (PROTONIX) EC tablet 40 mg (has no administration in time range)  psyllium (HYDROCIL/METAMUCIL) 1 packet (has no administration in time range)  baclofen (LIORESAL) tablet 10 mg (has no administration in time range)  gabapentin (NEURONTIN) capsule 300 mg (has no administration in time range)  albuterol (PROVENTIL) (2.5 MG/3ML) 0.083% nebulizer solution 2.5 mg (has no administration in time range)  umeclidinium-vilanterol (ANORO ELLIPTA) 62.5-25 MCG/ACT 1 puff (has no administration in time range)  enoxaparin (LOVENOX) injection 30 mg (has no administration in time range)  guaiFENesin (MUCINEX) 12 hr tablet 1,200 mg (has no administration in time range)  cefTRIAXone (ROCEPHIN) 1 g in sodium chloride 0.9 % 100 mL IVPB (has no  administration in time range)  azithromycin (ZITHROMAX) tablet 500 mg (has no administration in time range)  cefTRIAXone (ROCEPHIN) 1 g in sodium chloride 0.9 % 100 mL IVPB (0 g Intravenous Stopped 02/04/22 1720)  sodium chloride 0.9 % bolus 1,539 mL (0 mLs Intravenous Stopped 02/04/22 1941)    ED Course/ Medical Decision Making/ A&P Clinical Course as of 02/04/22 2023  Sun Feb 04, 2022  1821 Admitted to hospitalist [MT]    Clinical Course User Index [MT] Wyvonnia Dusky, MD  Medical Decision Making Amount and/or Complexity of Data Reviewed Labs: ordered. Radiology: ordered.  Risk Decision regarding hospitalization.   This patient presents to the ED with concern for pneumonia, infection. This involves an extensive number of treatment options, and is a complaint that carries with it a high risk of complications and morbidity.  The differential diagnosis includes bacterial pneumonia versus viral infection versus pleural effusion versus  Co-morbidities that complicate the patient evaluation: History of recurring infections and age raise risk for pneumonia  The patient does not have risk factors or history for HCAP at this time  Additional history obtained from the patient's son at bedside  I am not able to review the patient's outpatient labs or x-ray imaging on our system.  Repeat x-rays of been ordered.  I ordered and personally interpreted labs.  The pertinent results include: Significant leukocytosis.  COVID is negative  I ordered imaging studies including x-ray of the chest I independently visualized and interpreted imaging which showed multifocal pneumonia I agree with the radiologist interpretation  The patient was maintained on a cardiac monitor.  I personally viewed and interpreted the cardiac monitored which showed an underlying rhythm of: Normal sinus rhythm  Per my interpretation the patient's ECG shows sinus rhythm  I ordered medication  including IV Rocephin, IV doxycycline, and fluid bolus per sepsis protocol and for community-acquired pneumonia  Test Considered: Lower suspicion for acute PE at this time, do not feel a CT PE scan was indicated  After the interventions noted above, I reevaluated the patient and found that they have: stayed the same  She remains comfortably breathing on room air, no hypoxia.  Dispostion:  After consideration of the diagnostic results and the patients response to treatment, I feel that the patent would benefit from medical admission for pneumonia         Final Clinical Impression(s) / ED Diagnoses Final diagnoses:  Community acquired pneumonia, unspecified laterality    Rx / DC Orders ED Discharge Orders     None         Kyoko Elsea, Carola Rhine, MD 02/04/22 2024

## 2022-02-05 ENCOUNTER — Observation Stay (HOSPITAL_COMMUNITY): Payer: Medicare HMO

## 2022-02-05 DIAGNOSIS — R1310 Dysphagia, unspecified: Secondary | ICD-10-CM | POA: Diagnosis not present

## 2022-02-05 DIAGNOSIS — K449 Diaphragmatic hernia without obstruction or gangrene: Secondary | ICD-10-CM | POA: Diagnosis not present

## 2022-02-05 DIAGNOSIS — K222 Esophageal obstruction: Secondary | ICD-10-CM | POA: Diagnosis not present

## 2022-02-05 DIAGNOSIS — J189 Pneumonia, unspecified organism: Secondary | ICD-10-CM | POA: Diagnosis not present

## 2022-02-05 LAB — URINALYSIS, ROUTINE W REFLEX MICROSCOPIC
Bilirubin Urine: NEGATIVE
Glucose, UA: NEGATIVE mg/dL
Hgb urine dipstick: NEGATIVE
Ketones, ur: NEGATIVE mg/dL
Nitrite: NEGATIVE
Protein, ur: NEGATIVE mg/dL
Specific Gravity, Urine: 1.009 (ref 1.005–1.030)
pH: 7 (ref 5.0–8.0)

## 2022-02-05 LAB — CBC
HCT: 40.7 % (ref 36.0–46.0)
Hemoglobin: 13 g/dL (ref 12.0–15.0)
MCH: 30.6 pg (ref 26.0–34.0)
MCHC: 31.9 g/dL (ref 30.0–36.0)
MCV: 95.8 fL (ref 80.0–100.0)
Platelets: 258 10*3/uL (ref 150–400)
RBC: 4.25 MIL/uL (ref 3.87–5.11)
RDW: 12.7 % (ref 11.5–15.5)
WBC: 19.1 10*3/uL — ABNORMAL HIGH (ref 4.0–10.5)
nRBC: 0 % (ref 0.0–0.2)

## 2022-02-05 MED ORDER — ONDANSETRON HCL 4 MG/2ML IJ SOLN
4.0000 mg | Freq: Four times a day (QID) | INTRAMUSCULAR | Status: DC | PRN
  Administered 2022-02-05: 4 mg via INTRAVENOUS

## 2022-02-05 NOTE — TOC Initial Note (Signed)
Transition of Care [facility]) - Initial/Assessment Note    Patient Details  Name: Sherry Lambert MRN: [id number] Date of Birth: 01/24/1941  Transition of Care [facility]) CM/SW Contact:    Tom-Johnson, Renea Ee, RN Phone Number: 02/05/2022, 1:46 PM  Clinical Narrative:                  Transition of Care [facility]) Screening Note   Patient Details  Name: Sherry Lambert Date of Birth: August 18, 1941   Transition of Care [facility]) CM/SW Contact:    Tom-Johnson, Renea Ee, RN Phone Number: 02/05/2022, 1:46 PM  CM spoke with patient at bedside about needs for post hospital transition. Admitted for Pneumonia. From home alone. Has one supportive son. Family transports to and from appointments and assists with errands. Has all necessary DME's at home.  PCP is [name], MD and uses [facility] in [place]. States she has home Oxygen as needed from [employer]. Currently on room air.    Transition of Care Department [facility]) has reviewed patient and no TOC needs or recommendations have been identified at this time. TOC will continue to monitor patient advancement through interdisciplinary progression rounds. If new patient transition needs arise, please place a TOC consult.    Expected Discharge Plan: Home/Self Care Barriers to Discharge: Continued Medical Work up   Patient Goals and CMS Choice Patient states their goals for this hospitalization and ongoing recovery are:: To return home CMS Medicare.gov Compare Post Acute Care list provided to:: Patient Choice offered to / list presented to : NA  Expected Discharge Plan and Services Expected Discharge Plan: Home/Self Care   Discharge Planning Services: CM Consult Post Acute Care Choice: NA Living arrangements for the past 2 months: Single Family Home                 DME Arranged: N/A DME Agency: NA       HH Arranged: NA HH Agency: NA        Prior Living Arrangements/Services Living arrangements for the past 2 months:  Single Family Home Lives with:: Self Patient language and need for interpreter reviewed:: Yes Do you feel safe going back to the place where you live?: Yes      Need for Family Participation in Patient Care: Yes (Comment) Care giver support system in place?: Yes (comment) Current home services: DME (Cane, walker, wheelchair, shower seat.) Criminal Activity/Legal Involvement Pertinent to Current Situation/Hospitalization: No - Comment as needed  Activities of Daily Living Home Assistive Devices/Equipment: Walker (specify type) ADL Screening (condition at time of admission) Patient's cognitive ability adequate to safely complete daily activities?: Yes Is the patient deaf or have difficulty hearing?: No Does the patient have difficulty seeing, even when wearing glasses/contacts?: No Does the patient have difficulty concentrating, remembering, or making decisions?: No Patient able to express need for assistance with ADLs?: Yes Does the patient have difficulty dressing or bathing?: No Independently performs ADLs?: Yes (appropriate for developmental age) Does the patient have difficulty walking or climbing stairs?: Yes Weakness of Legs: Both Weakness of Arms/Hands: None  Permission Sought/Granted Permission sought to share information with : Case Manager, Family Supports Permission granted to share information with : Yes, Verbal Permission Granted              Emotional Assessment Appearance:: Appears stated age Attitude/Demeanor/Rapport: Engaged, Gracious Affect (typically observed): Accepting, Appropriate, Calm, Hopeful Orientation: : Oriented to Self, Oriented to Place, Oriented to  Time, Oriented to Situation Alcohol / Substance Use: Not Applicable  Psych Involvement: No (comment)  Admission diagnosis:  Pneumonia [J18.9] Community acquired pneumonia, unspecified laterality [J18.9] Patient Active Problem List   Diagnosis Date Noted   CAP (community acquired pneumonia)  02/04/2022   Pneumonia 02/04/2022   Seasonal allergic rhinitis due to pollen 12/23/2020   Blue toes 11/04/2020   Urinary retention 07/26/2020   Thrush 02/26/2020   COPD, severe ([place]) 11/06/2019   Muscle cramps 07/07/2019   Esophageal dysphagia    Venous insufficiency 11/29/2017   Urinary incontinence 11/29/2017   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    Class: Acute   Cecum mass    Mass in rectum    PVD (peripheral vascular disease) ([place]) 09/14/2016   Essential hypertension 12/20/2014   Routine general medical examination at a health care facility 10/22/2014   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) 08/04/2014   Peripheral arterial disease ([place]) 01/05/2014   Osteopenia 11/12/2013   Hyperlipidemia associated with type 2 diabetes mellitus ([place]) 09/19/2013   Esophageal stricture 12/23/2012   Diabetes mellitus type 2, controlled, 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[place]) 15/83/0940   PCP:  [name], MD Pharmacy:   [facility] [address] [place] [address] Phone: [phone] Fax: [phone]     Social Determinants of Health (SDOH) Interventions    Readmission Risk Interventions     View : No data to display.

## 2022-02-05 NOTE — Progress Notes (Signed)
Sherry Lambert  VQQ:[id number] DOB: 1941/04/22 DOA: 02/04/2022 PCP: [name], MD    Brief Narrative:  81 year old with a history of gold stage II COPD, CVA with residual right-sided weakness, esophageal stricture status post previous dilatation, and GERD who presented to the ED with the acute onset of fever and shortness of breath the morning of her admission.  She had a temperature to 101.5 at home.  There was no cough or wheezing.  She presented to her pulmonologist office at which time a CXR suggestive of multifocal pneumonia and the patient was sent to the ER.  The patient is known to have chronic dysphagia secondary to an esophageal stricture and is undergoing several dilations in the past.  She has not however had a dilation since 2019 and has not seen her GI doctor since that time.  Further history confirmed increasing episodes of "food sticking in the middle of my chest" as well as a 4 pound weight loss over 3 months.  Consultants:  None  Goals of Care:  Code Status: Full Code   DVT prophylaxis: Lovenox  Interim Hx: Afebrile.  Vital signs stable.  Saturation 94% room air.  Feeling much better at the time of my exam.  Denies significant shortness of breath.  No cough no fever.  Reports slowly improving appetite.  No other complaints.  Is anxious to go home.  Assessment & Plan:  Multifocal pulmonary infiltrates - Pneumonia  Despite history no clear evidence of dysphagia/aspiration testing -no significant esophageal findings -rapidly improving with treatment for simple community-acquired pneumonia -continue Rocephin alone and follow clinically  Possible recurrent aspiration - ruled out No convincing evidence of this on SLP eval  History of esophageal stricture with dilation Esophagram without evidence of severe stricture if present -follow-up in outpatient setting  Severe COPD Followed by Dr. Chase Caller at [facility] pulmonary -well compensated presently  History of  CVA    Family Communication: No family present at time of exam Disposition: Clinically improving -PT/OT evaluations -potential for discharge home 6/6 if improved clinically/stable overnight  Objective: Blood pressure (!) 117/91, pulse 72, temperature 99.1 F (37.3 C), temperature source Oral, resp. rate 17, height '4\' 11"'$  (1.499 m), weight 50.5 kg, SpO2 94 %.  Intake/Output Summary (Last 24 hours) at 02/05/2022 1059 Last data filed at 02/05/2022 0900 Gross per 24 hour  Intake 250 ml  Output 1200 ml  Net -950 ml   Filed Weights   02/04/22 1546 02/05/22 0503  Weight: 51.3 kg 50.5 kg    Examination: General: No acute respiratory distress at rest Lungs: Mild bibasilar crackles with no wheezing Cardiovascular: Regular rate and rhythm without murmur gallop or rub normal S1 and S2 Abdomen: Nontender, nondistended, soft, bowel sounds positive, no rebound, no ascites, no appreciable mass Extremities: No significant cyanosis, clubbing, or edema bilateral lower extremities  CBC: Recent Labs  Lab 02/04/22 1600 02/05/22 0414  WBC 32.5* 19.1*  NEUTROABS 29.3*  --   HGB 14.1 13.0  HCT 43.5 40.7  MCV 95.8 95.8  PLT 308 433   Basic Metabolic Panel: Recent Labs  Lab 02/04/22 1600  NA 136  K 4.5  CL 103  CO2 23  GLUCOSE 139*  BUN 17  CREATININE 1.02*  CALCIUM 9.3   GFR: Estimated Creatinine Clearance: 30.5 mL/min (A) (by C-G formula based on SCr of 1.02 mg/dL (H)).  Liver Function Tests: Recent Labs  Lab 02/04/22 1600  AST 19  ALT 16  ALKPHOS 65  BILITOT 0.8  PROT  6.5  ALBUMIN 3.4*    Coagulation Profile: Recent Labs  Lab 02/04/22 1600  INR 1.0    HbA1C: Hgb A1c MFr Bld  Date/Time Value Ref Range Status  08/09/2021 11:17 AM 6.3 4.6 - 6.5 % Final    Comment:    Glycemic Control Guidelines for People with Diabetes:Non Diabetic:  <6%Goal of Therapy: <7%Additional Action Suggested:  >8%   07/13/2020 10:35 AM 6.7 (H) 4.6 - 6.5 % Final    Comment:    Glycemic  Control Guidelines for People with Diabetes:Non Diabetic:  <6%Goal of Therapy: <7%Additional Action Suggested:  >8%     CBG: No results for input(s): GLUCAP in the last 168 hours.  Scheduled Meds:  acetaminophen  500 mg Oral Daily   aspirin EC  81 mg Oral Daily   azithromycin  500 mg Oral Daily   enoxaparin (LOVENOX) injection  30 mg Subcutaneous Q24H   gabapentin  300 mg Oral TID   guaiFENesin  1,200 mg Oral BID   pantoprazole  40 mg Oral Daily   pravastatin  20 mg Oral Daily   psyllium  1 packet Oral Daily   umeclidinium-vilanterol  1 puff Inhalation Daily   Continuous Infusions:  cefTRIAXone (ROCEPHIN)  IV 1 g (02/05/22 1017)     LOS: 0 days   Cherene Altes, MD [facility] Office  [phone] Pager - Text Page per Shea Evans  If 7PM-7AM, please contact night-coverage per Amion 02/05/2022, 10:59 AM

## 2022-02-05 NOTE — Evaluation (Signed)
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   [name]    Cataract    Chronic kidney disease    kidney stone   Colitis    Colon cancer ([place]) 06/2017   cecum   Diabetes mellitus without complication ([place])    no meds taken   Emphysema of lung ([facility])    GERD (gastroesophageal reflux disease)    Hyperlipidemia    Hypertension    Peripheral arterial disease ([place])    [name]    Stroke [facility])    November 2016- Thanksgiving night   Past Surgical History:  Past Surgical History:  Procedure Laterality Date   ABDOMINAL HYSTERECTOMY     BALLOON DILATION N/A 12/23/2012   Procedure: BALLOON DILATION;  Surgeon: Inda Castle, MD;  Location: [facility];  Service: Endoscopy;  Laterality: N/A;   BALLOON DILATION N/A 01/15/2013   Procedure: BALLOON DILATION;  Surgeon: Inda Castle, MD;  Location: [facility];  Service: Endoscopy;  Laterality: N/A;   BALLOON DILATION N/A 07/22/2018   Procedure: BALLOON DILATION;  Surgeon: Mauri Pole, MD;  Location: [facility];  Service: Endoscopy;  Laterality: N/A;   cataract     both eyes   COLONOSCOPY     COLONOSCOPY WITH PROPOFOL N/A 03/05/2017   Procedure: COLONOSCOPY WITH PROPOFOL;  Surgeon: Mauri Pole, MD;  Location: [facility];  Service: Endoscopy;  Laterality: N/A;   DENTAL RESTORATION/EXTRACTION WITH X-RAY     ESOPHAGOGASTRODUODENOSCOPY N/A 12/23/2012   Procedure: ESOPHAGOGASTRODUODENOSCOPY (EGD);  Surgeon: Inda Castle, MD;  [facility];  Service: Endoscopy;  Laterality: N/A;   ESOPHAGOGASTRODUODENOSCOPY N/A 01/15/2013   Procedure: ESOPHAGOGASTRODUODENOSCOPY (EGD);  Surgeon:  Inda Castle, MD;  Location: [facility];  Service: Endoscopy;  Laterality: N/A;   ESOPHAGOGASTRODUODENOSCOPY (EGD) WITH PROPOFOL N/A 07/22/2018   Procedure: ESOPHAGOGASTRODUODENOSCOPY (EGD) WITH PROPOFOL;  Surgeon: Mauri Pole, MD;  Location: [facility];  Service: Endoscopy;  Laterality: N/A;   TONSILLECTOMY     HPI:  81yo female admitted from home 02/04/22 with fever, SOB. PMH: COPD, CVA with right weakness, chronic dysphagia due to esophageal stricture s/p dilation (2019), GERD, CKD, colon cancer, DM, HTN, HLD.    Assessment / Plan / Recommendation  Clinical Impression  Pt seen at bedside for assessment of swallow function and safety. Pt presents with upper and lower dentures. CN exam unremarkable. Pt was observed with regular solids, puree, and thin liquid textures. No obvious oral issues or overt s/s aspiration noted. Pt reports no difficulty with swallowing, no coughing or choking. Occasional globus sensation. BaSw completed today. No penetration or aspiration documented.  SLP Visit Diagnosis: Dysphagia, unspecified (R13.10)    Aspiration Risk  Mild aspiration risk    Diet Recommendation Regular;Thin liquid   Liquid Administration via: Straw;Cup Medication Administration: Whole meds with liquid Supervision: Patient able to self feed Compensations: Slow rate;Small sips/bites;Minimize environmental distractions Postural Changes: Seated upright at 90 degrees;Remain upright for at least 30 minutes after po intake    Other  Recommendations Oral Care Recommendations: Oral care BID    Recommendations for follow up therapy are one component of a multi-disciplinary discharge planning process, led by the attending physician.  Recommendations may  be updated based on patient status, additional functional criteria and insurance authorization.  Follow up Recommendations No SLP follow up      Assistance Recommended at Discharge defer to MD  Functional Status Assessment Patient  has not had a recent decline in their functional status      Prognosis Prognosis for Safe Diet Advancement: Good      Swallow Study   General Date of Onset: 02/04/22 HPI: 81yo female admitted from home 02/04/22 with fever, SOB. PMH: COPD, CVA with right weakness, chronic dysphagia due to esophageal stricture s/p dilation (2019), GERD, CKD, colon cancer, DM, HTN, HLD. Type of Study: Bedside Swallow Evaluation Previous Swallow Assessment: November 2015 - reg/thin Diet Prior to this Study: Regular;Thin liquids Temperature Spikes Noted: No Respiratory Status: Room air History of Recent Intubation: No Behavior/Cognition: Alert;Cooperative;Pleasant mood Oral Cavity Assessment: Within Functional Limits Oral Care Completed by SLP: No Oral Cavity - Dentition: Dentures, top;Dentures, bottom Vision: Functional for self-feeding Self-Feeding Abilities: Able to feed self Patient Positioning: Upright in bed Baseline Vocal Quality: Normal Volitional Cough: Strong Volitional Swallow: Able to elicit    Oral/Motor/Sensory Function Overall Oral Motor/Sensory Function: Within functional limits   Ice Chips Ice chips: Not tested   Thin Liquid Thin Liquid: Within functional limits Presentation: Cup;Straw    Nectar Thick Nectar Thick Liquid: Not tested   Honey Thick Honey Thick Liquid: Not tested   Puree Puree: Within functional limits Presentation: Self Fed;Spoon   Solid     Solid: Within functional limits Presentation: [place] B. Quentin Ore, [facility], [place] Speech Language Pathologist Office: [phone]  Shonna Chock 02/05/2022,12:50 PM

## 2022-02-05 NOTE — Progress Notes (Incomplete)
New Admission Note:   Arrival Method:  Mental Orientation: Telemetry:  Assessment: Completed Skin: IV: Pain:  Tubes: Safety Measures: Safety Fall Prevention Plan has been discussed.  Admission: Completed 5MW Orientation: Patient has been oriented to the room, unit and staff.  Family:  Orders have been reviewed and implemented. Will continue to monitor the patient. Call light has been placed within reach and bed alarm has been activated.   Peterson Mathey BSN, RN-BC Phone number: [phone] 

## 2022-02-05 NOTE — Care Management Obs Status (Signed)
[place] NOTIFICATION   Patient Details  Name: Sherry Lambert MRN: [id number] Date of Birth: 12/28/1940   Medicare Observation Status Notification Given:  Yes    Tom-Johnson, Renea Ee, RN 02/05/2022, 1:44 PM

## 2022-02-06 ENCOUNTER — Other Ambulatory Visit (HOSPITAL_COMMUNITY): Payer: Self-pay

## 2022-02-06 DIAGNOSIS — J189 Pneumonia, unspecified organism: Secondary | ICD-10-CM | POA: Diagnosis not present

## 2022-02-06 LAB — CBC
HCT: 38.9 % (ref 36.0–46.0)
Hemoglobin: 12.3 g/dL (ref 12.0–15.0)
MCH: 30.2 pg (ref 26.0–34.0)
MCHC: 31.6 g/dL (ref 30.0–36.0)
MCV: 95.6 fL (ref 80.0–100.0)
Platelets: 270 10*3/uL (ref 150–400)
RBC: 4.07 MIL/uL (ref 3.87–5.11)
RDW: 12.8 % (ref 11.5–15.5)
WBC: 13.8 10*3/uL — ABNORMAL HIGH (ref 4.0–10.5)
nRBC: 0 % (ref 0.0–0.2)

## 2022-02-06 LAB — BASIC METABOLIC PANEL
Anion gap: 6 (ref 5–15)
BUN: 14 mg/dL (ref 8–23)
CO2: 23 mmol/L (ref 22–32)
Calcium: 9.2 mg/dL (ref 8.9–10.3)
Chloride: 109 mmol/L (ref 98–111)
Creatinine, Ser: 0.87 mg/dL (ref 0.44–1.00)
GFR, Estimated: >60 mL/min (ref >60)
Glucose, Bld: 111 mg/dL — ABNORMAL HIGH (ref 70–99)
Potassium: 4.6 mmol/L (ref 3.5–5.1)
Sodium: 138 mmol/L (ref 135–145)

## 2022-02-06 LAB — MYCOPLASMA PNEUMONIAE ANTIBODY, IGM: Mycoplasma pneumo IgM: <770 U/mL (ref 0–769)

## 2022-02-06 LAB — PROCALCITONIN: Procalcitonin: 0.52 ng/mL

## 2022-02-06 MED ORDER — AMOXICILLIN-POT CLAVULANATE 875-125 MG PO TABS: 1.0000 | ORAL_TABLET | Freq: Two times a day (BID) | ORAL | Status: DC

## 2022-02-06 MED ORDER — PROPOFOL 1000 MG/100ML IV EMUL
INTRAVENOUS | Status: AC
  Filled 2022-02-06: qty 200

## 2022-02-06 MED ORDER — AZITHROMYCIN 250 MG PO TABS
250.0000 mg | ORAL_TABLET | Freq: Every day | ORAL | 0 refills | Status: DC
  Filled 2022-02-06: qty 2, 2d supply, fill #0

## 2022-02-06 MED ORDER — AMOXICILLIN-POT CLAVULANATE 875-125 MG PO TABS
1.0000 | ORAL_TABLET | Freq: Two times a day (BID) | ORAL | 0 refills | Status: DC
  Filled 2022-02-06: qty 10, 5d supply, fill #0

## 2022-02-06 MED ORDER — AMOXICILLIN-POT CLAVULANATE 875-125 MG PO TABS
1.0000 | ORAL_TABLET | Freq: Two times a day (BID) | ORAL | 0 refills | Status: DC
  Filled 2022-02-06: qty 9, 5d supply, fill #0

## 2022-02-06 MED ORDER — PHENYLEPHRINE HCL-NACL 20-0.9 MG/250ML-% IV SOLN
INTRAVENOUS | Status: AC
  Filled 2022-02-06: qty 500

## 2022-02-06 MED ORDER — AZITHROMYCIN 500 MG PO TABS
250.0000 mg | ORAL_TABLET | Freq: Every day | ORAL | Status: DC
  Administered 2022-02-06: 250 mg via ORAL
  Filled 2022-02-06: qty 1

## 2022-02-06 NOTE — Evaluation (Signed)
Occupational Therapy Evaluation Patient Details Name: Sherry Lambert MRN: [id number] DOB: 08-28-1941 Today's Date: 02/06/2022   History of Present Illness 81yo female admitted from home 02/04/22 with fever, SOB. PMH: COPD, CVA with right weakness, chronic dysphagia due to esophageal stricture s/p dilation (2019), GERD, CKD, colon cancer, DM, HTN, HLD.   Clinical Impression   Pt currently supervision for selfcare tasks sit to stand and functional transfers with use of the RW for support.  Feel she will benefit from follow-up OT via home health for safety and to continue progression toward modified independent level for some selfcare tasks.  Recommend initial 24 hr supervision for safety at home secondary to increased fall risk.        Recommendations for follow up therapy are one component of a multi-disciplinary discharge planning process, led by the attending physician.  Recommendations may be updated based on patient status, additional functional criteria and insurance authorization.   Follow Up Recommendations  Home health OT    Assistance Recommended at Discharge Frequent or constant Supervision/Assistance  Patient can return home with the following A little help with walking and/or transfers;A little help with bathing/dressing/bathroom;Assistance with cooking/housework;Assist for transportation;Help with stairs or ramp for entrance    Functional Status Assessment  Patient has had a recent decline in their functional status and demonstrates the ability to make significant improvements in function in a reasonable and predictable amount of time.  Equipment Recommendations  None recommended by OT       Precautions / Restrictions Precautions Precautions: Fall Required Braces or Orthoses: Other Brace Other Brace: Walks better in shoes Restrictions Weight Bearing Restrictions: No      Mobility Bed Mobility                    Transfers Overall transfer level: Needs  assistance Equipment used: Rolling walker (2 wheels) Transfers: Sit to/from Stand Sit to Stand: Supervision           General transfer comment: Pt able to complete sit to stand from the bedside recliner for dressing tasks, but stabilizes her LEs agains the chair instead of scooting forward and transitioning correctly.  Mod instructional cueing for reaching back to surfaces and letting go of the walker before sitting.      Balance Overall balance assessment: Needs assistance   Sitting balance-Leahy Scale: Fair     Standing balance support: Reliant on assistive device for balance, During functional activity Standing balance-Leahy Scale: Poor Standing balance comment: Pt needs UE support or LEs stabilized on surface for balance.                           ADL either performed or assessed with clinical judgement   ADL Overall ADL's : Needs assistance/impaired Eating/Feeding: Independent;Sitting   Grooming: Wash/dry hands;Wash/dry face;Supervision/safety;Standing   Upper Body Bathing: Set up;Sitting   Lower Body Bathing: Supervison/ safety;Sit to/from stand   Upper Body Dressing : Set up;Sitting   Lower Body Dressing: Supervision/safety;Sit to/from stand   Toilet Transfer: Supervision/safety;Ambulation;Rolling walker (2 wheels)   Toileting- Clothing Manipulation and Hygiene: Supervision/safety;Sit to/from stand       Functional mobility during ADLs: Supervision/safety;Rolling walker (2 wheels) General ADL Comments: Pt reports having a shower seat in the walk-in shower as well as 3:1 over the toilet.  Recommend initial 24 hr supervision for safety and HHOT for evaluation and treatment to progress back to a modified independent level.     Vision Baseline Vision/History:  1 Wears glasses (near vision) Ability to See in Adequate Light: 0 Adequate Patient Visual Report: No change from baseline Vision Assessment?: No apparent visual deficits     Perception  Perception Perception: Within Functional Limits   Praxis Praxis Praxis: Intact    Pertinent Vitals/Pain Pain Assessment Pain Assessment: No/denies pain     Hand Dominance Left (R hand effected by cva; now uses L hand more)   Extremity/Trunk Assessment Upper Extremity Assessment Upper Extremity Assessment: RUE deficits/detail RUE Deficits / Details: Pt with history of slight hemiparesis from previous CVA.  Increased ataxia noted proximally but FM coordination WFLS for opposition of all digits to thumb.  AROM shoulder flexion 0-90 degrees with increased pain noted at end range. RUE: Shoulder pain with ROM RUE Sensation: decreased light touch;decreased proprioception RUE Coordination: decreased gross motor   Lower Extremity Assessment Lower Extremity Assessment: Defer to PT evaluation RLE Deficits / Details: Grossly 3+/5 ankle dorsiflexion; decr coordination noted; reports she does not usually get tripped up by her R foot   Cervical / Trunk Assessment Cervical / Trunk Assessment: Kyphotic   Communication Communication Communication: No difficulties   Cognition Arousal/Alertness: Awake/alert Behavior During Therapy: WFL for tasks assessed/performed Overall Cognitive Status: Within Functional Limits for tasks assessed                                       General Comments  walked on room air with noted DOE 2/4 by the end of teh walk; O2 sats read 85% with good pleth wave; recovered with seated rest and focused breathing back to greater than 92%            Home Living Family/patient expects to be discharged to:: Private residence Living Arrangements: Alone Available Help at Discharge: Family;Available PRN/intermittently (supportive son, daughter in law, help pt get to appts) Type of Home: House Home Access: Stairs to enter [employer] of Steps: 3 Entrance Stairs-Rails: Right Home Layout: One level     Bathroom Shower/Tub: Walk-in shower          Home Equipment: Conservation officer, nature (2 wheels);Rollator (4 wheels);Cane - single point;Shower seat;BSC/3in1          Prior Functioning/Environment Prior Level of Function : Independent/Modified Independent             Mobility Comments: Cosnsitently uses a rollator RW; has one for use in housw, and parks one outside (so she doesn't need to take a rollaotr up and dwon steps to enter her home)          OT Problem List: Decreased range of motion;Decreased strength;Decreased activity tolerance;Impaired balance (sitting and/or standing);Pain;Impaired sensation;Decreased knowledge of use of DME or AE;Decreased coordination;Impaired UE functional use                   AM-PAC OT "6 Clicks" Daily Activity     Outcome Measure Help from another person eating meals?: None Help from another person taking care of personal grooming?: A Little Help from another person toileting, which includes using toliet, bedpan, or urinal?: A Little Help from another person bathing (including washing, rinsing, drying)?: A Little Help from another person to put on and taking off regular upper body clothing?: None Help from another person to put on and taking off regular lower body clothing?: A Little 6 Click Score: 20   End of Session Equipment Utilized During Treatment: Rolling walker (2 wheels) Nurse  Communication: Mobility status  Activity Tolerance: Patient tolerated treatment well Patient left: in chair;with call bell/phone within reach  OT Visit Diagnosis: Unsteadiness on feet (R26.81);Muscle weakness (generalized) (M62.81);Hemiplegia and hemiparesis Hemiplegia - Right/Left: Right Hemiplegia - dominant/non-dominant: Non-Dominant Hemiplegia - caused by: Cerebral infarction                Time: 3159-4585 OT Time Calculation (min): 31 min Charges:  OT General Charges $OT Visit: 1 Visit OT Evaluation $OT Eval Moderate Complexity: 1 Mod OT Treatments $Self Care/Home Management : 8-22  mins Kazandra Forstrom OTR/L  02/06/2022, 1:27 PM

## 2022-02-06 NOTE — TOC Transition Note (Signed)
Transition of Care [facility]) - CM/SW Discharge Note   Patient Details  Name: ANAHLA BEVIS MRN: [id number] Date of Birth: 1941-03-22  Transition of [facility]) CM/SW Contact:  Tom-Johnson, Renea Ee, RN Phone Number: 02/06/2022, 1:17 PM   Clinical Narrative:     Patient is scheduled for discharge today. Home health disciplines referral with [facility] per patient's request. Delsa Sale voiced acceptance and Info on AVS. Patient has all necessary DME's at home. Denies any other needs. Family to transport at discharge. No further TOC needs noted.    Final next level of care: [place] Barriers to Discharge: Barriers Resolved   Patient Goals and CMS Choice Patient states their goals for this hospitalization and ongoing recovery are:: To return home CMS Medicare.gov Compare Post Acute Care list provided to:: Patient Choice offered to / list presented to : NA  Discharge Placement                Patient to be transferred to facility by: Family      Discharge Plan and Services   Discharge Planning Services: CM Consult Post Acute Care Choice: NA          DME Arranged: N/A DME Agency: NA       HH Arranged: PT, OT HH Agency: [facility] Date [place]: 02/06/22 Time [place]: [id number] Representative spoke with at [place]: Delsa Sale  Social Determinants of Health ([place]) Interventions     Readmission Risk Interventions     View : No data to display.

## 2022-02-06 NOTE — Evaluation (Signed)
Physical Therapy Evaluation Patient Details Name: Sherry Lambert MRN: [id number] DOB: 02/05/1941 Today's Date: 02/06/2022  History of Present Illness  81yo female admitted from home 02/04/22 with fever, SOB. PMH: COPD, CVA with right weakness, chronic dysphagia due to esophageal stricture s/p dilation (2019), GERD, CKD, colon cancer, DM, HTN, HLD.  Clinical Impression   Pt admitted with above diagnosis. Lives at home alone, in a single-level home with 3 steps to enter; Prior to admission, pt was able to manage independently in the home, consistently uses rollator RW for amb; reports no falls; Presents to PT with unsteady gait, tendency to get short of breath with progressive amb, and incr fall risk; Needs min assist to get up to EOB; light mod assist for sit<>stand, notable also that she tends to brace the backs of her legs against the bed or her seated surface for stability -- this is indicative of incr risk for falls; walked the hallway with min/minguard assist (shoes on), and noted DOE on room air, along with O2 desaturation; able to recover back to above 92% with focused breathing and seated rest;  Pt currently with functional limitations due to the deficits listed below (see PT Problem List). Pt will benefit from skilled PT to increase their independence and safety with mobility to allow discharge to the venue listed below.       I'm hopeful that she will be able to return home, and will request HHPT follow up; Would like to see if family can give at least daily check ins (if not more help) when she first gets home -- I would also like to see how she does with OT; Safety with ADLs is a VERY important piece of the decision to be able to be home alone.     Recommendations for follow up therapy are one component of a multi-disciplinary discharge planning process, led by the attending physician.  Recommendations may be updated based on patient status, additional functional criteria and insurance  authorization.  Follow Up Recommendations Home health PT    Assistance Recommended at Discharge Set up Supervision/Assistance  Patient can return home with the following  A little help with walking and/or transfers;Help with stairs or ramp for entrance;Assistance with cooking/housework;Assist for transportation    Equipment Recommendations None recommended by PT (pretty well-equipped)  Recommendations for Other Services  OT consult    Functional Status Assessment Patient has had a recent decline in their functional status and demonstrates the ability to make significant improvements in function in a reasonable and predictable amount of time.     Precautions / Restrictions Precautions Precautions: Fall Required Braces or Orthoses: Other Brace Other Brace: Walks better in shoes      Mobility  Bed Mobility Overal bed mobility: Needs Assistance Bed Mobility: Supine to Sit     Supine to sit: Supervision     General bed mobility comments: Slow moving and cues to sit up completely and scoot to EOB to get both feet to the floor    Transfers Overall transfer level: Needs assistance Equipment used: 1 person hand held assist, Rollator (4 wheels) Transfers: Sit to/from Stand Sit to Stand: Mod assist           General transfer comment: Light mod assist for balance; noted pt bracing backs of legs against bed for stabiltiy with rise; When she braces against rollator, it moves -- needed mod assist to steady rollator when standing from it    Ambulation/Gait Ambulation/Gait assistance: Min guard (with and without  physical contact ) Gait Distance (Feet): 80 Feet (x2) Assistive device: Rollator (4 wheels) Gait Pattern/deviations: Step-through pattern, Decreased step length - right, Decreased step length - left, Decreased stride length, Trunk flexed       General Gait Details: Uncoordinated stepping R foot; Trunk flexed, and Rollator tending to get too far in front towards teh end  of the walk -- could be attributable to different rollator (slightly too high)  Stairs            Wheelchair Mobility    Modified Rankin (Stroke Patients Only)       Balance Overall balance assessment: Needs assistance   Sitting balance-Leahy Scale: Fair (approaching Good)       Standing balance-Leahy Scale: Poor Standing balance comment: Dependentl on at least one UE support on steady surface                             Pertinent Vitals/Pain Pain Assessment Pain Assessment: Faces Faces Pain Scale: Hurts a little bit Pain Location: R foot/toes Pain Descriptors / Indicators: Numbness Pain Intervention(s): Monitored during session    Home Living Family/patient expects to be discharged to:: Private residence Living Arrangements: Alone Available Help at Discharge: Family;Available PRN/intermittently (supportive son, daughter in law, help pt get to appts) Type of Home: House Home Access: Stairs to enter Entrance Stairs-Rails: Right Entrance Stairs-Number of Steps: 3   Home Layout: One level Home Equipment: Conservation officer, nature (2 wheels);Rollator (4 wheels);Cane - single point;Shower seat      Prior Function Prior Level of Function : Independent/Modified Independent             Mobility Comments: Cosnsitently uses a rollator RW; has one for use in housw, and parks one outside (so she doesn't need to take a rollaotr up and dwon steps to enter her home)       Hand Dominance   Dominant Hand:  (R hand effected by cva; now uses L hand more)    Extremity/Trunk Assessment   Upper Extremity Assessment Upper Extremity Assessment: Defer to OT evaluation    Lower Extremity Assessment Lower Extremity Assessment: Generalized weakness;RLE deficits/detail RLE Deficits / Details: Grossly 3+/5 ankle dorsiflexion; decr coordination noted; reports she does not usually get tripped up by her R foot       Communication   Communication: No difficulties   Cognition Arousal/Alertness: Awake/alert Behavior During Therapy: WFL for tasks assessed/performed Overall Cognitive Status: Within Functional Limits for tasks assessed                                          General Comments General comments (skin integrity, edema, etc.): walked on room air with noted DOE 2/4 by the end of teh walk; O2 sats read 85% with good pleth wave; recovered with seated rest and focused breathing back to greater than 92%    Exercises     Assessment/Plan    PT Assessment Patient needs continued PT services  PT Problem List Decreased strength;Decreased activity tolerance;Decreased balance;Decreased mobility;Decreased coordination;Decreased safety awareness;Decreased knowledge of precautions;Cardiopulmonary status limiting activity       PT Treatment Interventions DME instruction;Gait training;Stair training;Functional mobility training;Therapeutic activities;Therapeutic exercise;Balance training;Neuromuscular re-education;Patient/family education    PT Goals (Current goals can be found in the Care Plan section)  Acute Rehab PT Goals Patient Stated Goal: Hopes to get home PT Goal  Formulation: With patient Time For Goal Achievement: 02/20/22 Potential to Achieve Goals: Good    Frequency Min 3X/week     Co-evaluation               AM-PAC PT "6 Clicks" Mobility  Outcome Measure Help needed turning from your back to your side while in a flat bed without using bedrails?: None Help needed moving from lying on your back to sitting on the side of a flat bed without using bedrails?: A Little Help needed moving to and from a bed to a chair (including a wheelchair)?: A Little Help needed standing up from a chair using your arms (e.g., wheelchair or bedside chair)?: A Little Help needed to walk in hospital room?: A Little Help needed climbing 3-5 steps with a railing? : A Little 6 Click Score: 19    End of Session Equipment Utilized  During Treatment: Gait belt Activity Tolerance: Patient tolerated treatment well Patient left: in chair;with call bell/phone within reach Nurse Communication: Mobility status (discussed with Juree, NT) PT Visit Diagnosis: Unsteadiness on feet (R26.81);Muscle weakness (generalized) (M62.81);Hemiplegia and hemiparesis Hemiplegia - Right/Left: Right Hemiplegia - dominant/non-dominant: Dominant Hemiplegia - caused by: Cerebral infarction    Time: 1052 (minus 5 min to get rollator)-1130 PT Time Calculation (min) (ACUTE ONLY): 38 min   Charges:   PT Evaluation $PT Eval Moderate Complexity: 1 Mod PT Treatments $Gait Training: 8-22 mins        Roney Marion, PT  Acute Rehabilitation Services Office [phone]   Colletta [place] 02/06/2022, 12:11 PM

## 2022-02-06 NOTE — Discharge Summary (Signed)
DISCHARGE SUMMARY  Sherry Lambert  MR#: [id number]  DOB:June 06, 1941  Date of Admission: 02/04/2022 Date of Discharge: 02/06/2022  Attending Physician:Elray Dains Hennie Duos, MD  Patient's FBP:ZWCHENID, Real Cons, MD  Consults: none   Disposition: D/C home   Follow-up Appts:  Follow-up Information     [name], MD Follow up.   Specialty: Internal Medicine Contact information: [place] [address] [phone]                 Discharge Diagnoses: Multifocal pulmonary infiltrates - Pneumonia  History of esophageal stricture with dilation Severe COPD History of CVA  Initial presentation: 81 year old with a history of gold stage II COPD, CVA with residual right-sided weakness, esophageal stricture status post previous dilatation, and GERD who presented to the ED with the acute onset of fever and shortness of breath the morning of her admission.  She had a temperature to 101.5 at home. There was no cough or wheezing.  She presented to her Pulmonologist office at which time a CXR was suggestive of multifocal pneumonia and the patient was sent to the ER.  The patient is known to have chronic dysphagia secondary to an esophageal stricture and is undergoing several dilations in the past.  She has not had a dilation since 2019 and has not seen her GI doctor since that time.  Further history confirmed increasing episodes of "food sticking in the middle of my chest" as well as a 4 pound weight loss over 3 months.   Hospital Course:  Multifocal pulmonary infiltrates - Pneumonia  Despite history no clear evidence of dysphagia/aspiration on testing during this admit - no significant esophageal findings on esophagram -rapidly improved with treatment for simple community-acquired pneumonia -transitioned to oral antibiotics and cleared for discharge home 02/06/2022 to complete 7 full days of antibiotic therapy -had dramatically improved at time of discharge with  patient not requiring oxygen support and not experiencing cough or shortness of breath or fever  Possible recurrent aspiration - ruled out No convincing evidence of this on SLP eval - esophagram w/o evidence of severe stenosis - no clear indication to modify diet - tolerated reg diet and liquids w/o difficulty during this admit    History of esophageal stricture with dilation Esophagram without evidence of severe stricture at present -follow-up in outpatient setting as needed   Severe COPD Followed by Dr. Chase Caller at [facility] pulmonary -well compensated during this admission w/ no changes made in her chronic regimen    History of CVA   Allergies as of 02/06/2022       Reactions   Lipitor [atorvastatin] Itching        Medication List     STOP taking these medications    baclofen 10 MG tablet Commonly known as: LIORESAL   doxycycline 100 MG capsule Commonly known as: VIBRAMYCIN   nystatin 100000 UNIT/ML suspension Commonly known as: MYCOSTATIN       TAKE these medications    acetaminophen 500 MG tablet Commonly known as: TYLENOL Take 500 mg by mouth daily.   albuterol (2.5 MG/3ML) 0.083% nebulizer solution Commonly known as: PROVENTIL Take 3 mLs (2.5 mg total) by nebulization every 6 (six) hours as needed for wheezing or shortness of breath.   albuterol 108 (90 Base) MCG/ACT inhaler Commonly known as: VENTOLIN HFA Inhale 1-2 puffs into the lungs every 4 (four) hours as needed for wheezing or shortness of breath.   amoxicillin-clavulanate 875-125 MG tablet Commonly known as: AUGMENTIN Take 1 tablet  by mouth every 12 (twelve) hours. Start taking on: February 07, 2022   Anoro Ellipta 62.5-25 MCG/ACT Aepb Generic drug: umeclidinium-vilanterol Inhale 1 puff into the lungs daily.   aspirin EC 81 MG tablet Take 1 tablet (81 mg total) by mouth daily.   azithromycin 250 MG tablet Commonly known as: ZITHROMAX Take 1 tablet (250 mg total) by mouth daily. Start taking  on: February 07, 2022   gabapentin 300 MG capsule Commonly known as: NEURONTIN TAKE 1 CAPSULE BY MOUTH THREE TIMES DAILY What changed: when to take this   omeprazole 40 MG capsule Commonly known as: PRILOSEC Take 1 capsule by mouth once daily   pravastatin 20 MG tablet Commonly known as: PRAVACHOL Take 1 tablet by mouth once daily   REFRESH OP Place 1 drop into both eyes daily as needed (For dry eyes).   traMADol 50 MG tablet Commonly known as: ULTRAM Take 1 tablet (50 mg total) by mouth daily as needed. What changed: reasons to take this   VITAMIN D PO Take 1 tablet by mouth daily.        Day of Discharge BP (!) 107/48   Pulse 89   Temp 98.1 F (36.7 C) (Oral)   Resp 15   Ht '4\' 11"'$  (1.499 m)   Wt 50.5 kg   SpO2 96%   BMI 22.49 kg/m   Physical Exam: General: No acute respiratory distress Lungs: Fine bibasilar crackles with no wheezing and good air movement throughout all fields Cardiovascular: Regular rate and rhythm without murmur gallop or rub normal S1 and S2 Abdomen: Nontender, nondistended, soft, bowel sounds positive, no rebound, no ascites, no appreciable mass Extremities: No significant cyanosis, clubbing, or edema bilateral lower extremities  Basic Metabolic Panel: Recent Labs  Lab 02/04/22 1600 02/06/22 0601  NA 136 138  K 4.5 4.6  CL 103 109  CO2 23 23  GLUCOSE 139* 111*  BUN 17 14  CREATININE 1.02* 0.87  CALCIUM 9.3 9.2   CBC: Recent Labs  Lab 02/04/22 1600 02/05/22 0414 02/06/22 0601  WBC 32.5* 19.1* 13.8*  NEUTROABS 29.3*  --   --   HGB 14.1 13.0 12.3  HCT 43.5 40.7 38.9  MCV 95.8 95.8 95.6  PLT 308 258 270    Recent Results (from the past 240 hour(s))  Culture, blood (Routine x 2)     Status: None (Preliminary result)   Collection Time: 02/04/22  3:54 PM   Specimen: BLOOD  Result Value Ref Range Status   Specimen Description BLOOD LEFT ANTECUBITAL  Final   Special Requests   Final    BOTTLES DRAWN AEROBIC AND ANAEROBIC  Blood Culture results may not be optimal due to an excessive volume of blood received in culture bottles   Culture   Final    NO GROWTH 2 DAYS Performed at [facility], [address] [place], [place] [address]    Report Status PENDING  Incomplete  Culture, blood (Routine x 2)     Status: None (Preliminary result)   Collection Time: 02/04/22  4:01 PM   Specimen: BLOOD  Result Value Ref Range Status   Specimen Description BLOOD LEFT ANTECUBITAL  Final   Special Requests   Final    BOTTLES DRAWN AEROBIC AND ANAEROBIC Blood Culture results may not be optimal due to an inadequate volume of blood received in culture bottles   Culture   Final    NO GROWTH 2 DAYS Performed at [facility], [place]  [address], [place], [place] [address]    Report Status PENDING  Incomplete  SARS Coronavirus 2 by RT PCR (hospital order, performed in [facility] hospital lab) *cepheid single result test* Anterior Nasal Swab     Status: None   Collection Time: 02/04/22  6:40 PM   Specimen: Anterior Nasal Swab  Result Value Ref Range Status   SARS Coronavirus 2 by RT PCR NEGATIVE NEGATIVE Final    Comment: (NOTE) SARS-CoV-2 target nucleic acids are NOT DETECTED.  The SARS-CoV-2 RNA is generally detectable in upper and lower respiratory specimens during the acute phase of infection. The lowest concentration of SARS-CoV-2 viral copies this assay can detect is 250 copies / mL. A negative result does not preclude SARS-CoV-2 infection and should not be used as the sole basis for treatment or other patient management decisions.  A negative result may occur with improper specimen collection / handling, submission of specimen other than nasopharyngeal swab, presence of viral mutation(s) within the areas targeted by this assay, and inadequate number of viral copies (<250 copies / mL). A negative result must be combined with clinical observations, patient history, and epidemiological information.  Fact  Sheet for Patients:   https://www.patel.info/  Fact Sheet for Healthcare Providers: https://hall.com/  This test is not yet approved or  cleared by the [place] FDA and has been authorized for detection and/or diagnosis of SARS-CoV-2 by FDA under an Emergency Use Authorization (EUA).  This EUA will remain in effect (meaning this test can be used) for the duration of the COVID-19 declaration under Section 564(b)(1) of the Act, 21 U.S.C. section 360bbb-3(b)(1), unless the authorization is terminated or revoked sooner.  Performed at [facility], [place] [address] [place], [place] [address]      Time spent in discharge (includes decision making & examination of pt): 35 minutes  02/06/2022, 11:45 AM   Cherene Altes, MD [facility] Office  [phone]

## 2022-02-08 ENCOUNTER — Telehealth: Payer: Self-pay | Admitting: Internal Medicine

## 2022-02-08 DIAGNOSIS — E1151 Type 2 diabetes mellitus with diabetic peripheral angiopathy without gangrene: Secondary | ICD-10-CM | POA: Diagnosis not present

## 2022-02-08 DIAGNOSIS — Z7982 Long term (current) use of aspirin: Secondary | ICD-10-CM | POA: Diagnosis not present

## 2022-02-08 DIAGNOSIS — K219 Gastro-esophageal reflux disease without esophagitis: Secondary | ICD-10-CM | POA: Diagnosis not present

## 2022-02-08 DIAGNOSIS — Z79891 Long term (current) use of opiate analgesic: Secondary | ICD-10-CM | POA: Diagnosis not present

## 2022-02-08 DIAGNOSIS — J189 Pneumonia, unspecified organism: Secondary | ICD-10-CM | POA: Diagnosis not present

## 2022-02-08 DIAGNOSIS — I69351 Hemiplegia and hemiparesis following cerebral infarction affecting right dominant side: Secondary | ICD-10-CM | POA: Diagnosis not present

## 2022-02-08 DIAGNOSIS — E1122 Type 2 diabetes mellitus with diabetic chronic kidney disease: Secondary | ICD-10-CM | POA: Diagnosis not present

## 2022-02-08 DIAGNOSIS — I129 Hypertensive chronic kidney disease with stage 1 through stage 4 chronic kidney disease, or unspecified chronic kidney disease: Secondary | ICD-10-CM | POA: Diagnosis not present

## 2022-02-08 DIAGNOSIS — C180 Malignant neoplasm of cecum: Secondary | ICD-10-CM | POA: Diagnosis not present

## 2022-02-08 DIAGNOSIS — K222 Esophageal obstruction: Secondary | ICD-10-CM | POA: Diagnosis not present

## 2022-02-08 DIAGNOSIS — J441 Chronic obstructive pulmonary disease with (acute) exacerbation: Secondary | ICD-10-CM | POA: Diagnosis not present

## 2022-02-08 DIAGNOSIS — J440 Chronic obstructive pulmonary disease with acute lower respiratory infection: Secondary | ICD-10-CM | POA: Diagnosis not present

## 2022-02-08 DIAGNOSIS — N189 Chronic kidney disease, unspecified: Secondary | ICD-10-CM | POA: Diagnosis not present

## 2022-02-08 DIAGNOSIS — Z7951 Long term (current) use of inhaled steroids: Secondary | ICD-10-CM | POA: Diagnosis not present

## 2022-02-08 DIAGNOSIS — E785 Hyperlipidemia, unspecified: Secondary | ICD-10-CM | POA: Diagnosis not present

## 2022-02-08 DIAGNOSIS — R1314 Dysphagia, pharyngoesophageal phase: Secondary | ICD-10-CM | POA: Diagnosis not present

## 2022-02-08 NOTE — Telephone Encounter (Signed)
Sherry Lambert with [facility] calls today in need of orders for physical therapy. Frequency being   Once a week for 7 weeks  These orders can be given verbally at   [facility]: [place] also states that PT has been diagnosed with diabetes but is not currently checking levels at home. Wants to make sure they are being checked in office or if she will need to be checking them at home.

## 2022-02-09 ENCOUNTER — Telehealth: Payer: Self-pay | Admitting: Internal Medicine

## 2022-02-09 DIAGNOSIS — Z7982 Long term (current) use of aspirin: Secondary | ICD-10-CM | POA: Diagnosis not present

## 2022-02-09 DIAGNOSIS — K222 Esophageal obstruction: Secondary | ICD-10-CM | POA: Diagnosis not present

## 2022-02-09 DIAGNOSIS — Z79891 Long term (current) use of opiate analgesic: Secondary | ICD-10-CM | POA: Diagnosis not present

## 2022-02-09 DIAGNOSIS — I129 Hypertensive chronic kidney disease with stage 1 through stage 4 chronic kidney disease, or unspecified chronic kidney disease: Secondary | ICD-10-CM | POA: Diagnosis not present

## 2022-02-09 DIAGNOSIS — E1151 Type 2 diabetes mellitus with diabetic peripheral angiopathy without gangrene: Secondary | ICD-10-CM | POA: Diagnosis not present

## 2022-02-09 DIAGNOSIS — R1314 Dysphagia, pharyngoesophageal phase: Secondary | ICD-10-CM | POA: Diagnosis not present

## 2022-02-09 DIAGNOSIS — J441 Chronic obstructive pulmonary disease with (acute) exacerbation: Secondary | ICD-10-CM | POA: Diagnosis not present

## 2022-02-09 DIAGNOSIS — J440 Chronic obstructive pulmonary disease with acute lower respiratory infection: Secondary | ICD-10-CM | POA: Diagnosis not present

## 2022-02-09 DIAGNOSIS — I69351 Hemiplegia and hemiparesis following cerebral infarction affecting right dominant side: Secondary | ICD-10-CM | POA: Diagnosis not present

## 2022-02-09 DIAGNOSIS — N189 Chronic kidney disease, unspecified: Secondary | ICD-10-CM | POA: Diagnosis not present

## 2022-02-09 DIAGNOSIS — K219 Gastro-esophageal reflux disease without esophagitis: Secondary | ICD-10-CM | POA: Diagnosis not present

## 2022-02-09 DIAGNOSIS — E1122 Type 2 diabetes mellitus with diabetic chronic kidney disease: Secondary | ICD-10-CM | POA: Diagnosis not present

## 2022-02-09 DIAGNOSIS — J189 Pneumonia, unspecified organism: Secondary | ICD-10-CM | POA: Diagnosis not present

## 2022-02-09 DIAGNOSIS — C180 Malignant neoplasm of cecum: Secondary | ICD-10-CM | POA: Diagnosis not present

## 2022-02-09 DIAGNOSIS — Z7951 Long term (current) use of inhaled steroids: Secondary | ICD-10-CM | POA: Diagnosis not present

## 2022-02-09 DIAGNOSIS — E785 Hyperlipidemia, unspecified: Secondary | ICD-10-CM | POA: Diagnosis not present

## 2022-02-09 LAB — CULTURE, BLOOD (ROUTINE X 2)
Culture: NO GROWTH
Culture: NO GROWTH

## 2022-02-09 NOTE — Telephone Encounter (Signed)
Needs verbal order for OT 1 time a week for 8 weeks.

## 2022-02-09 NOTE — Telephone Encounter (Signed)
Okay for verbals °

## 2022-02-12 DIAGNOSIS — J440 Chronic obstructive pulmonary disease with acute lower respiratory infection: Secondary | ICD-10-CM | POA: Diagnosis not present

## 2022-02-12 DIAGNOSIS — R1314 Dysphagia, pharyngoesophageal phase: Secondary | ICD-10-CM | POA: Diagnosis not present

## 2022-02-12 DIAGNOSIS — I129 Hypertensive chronic kidney disease with stage 1 through stage 4 chronic kidney disease, or unspecified chronic kidney disease: Secondary | ICD-10-CM | POA: Diagnosis not present

## 2022-02-12 DIAGNOSIS — E785 Hyperlipidemia, unspecified: Secondary | ICD-10-CM | POA: Diagnosis not present

## 2022-02-12 DIAGNOSIS — Z7951 Long term (current) use of inhaled steroids: Secondary | ICD-10-CM | POA: Diagnosis not present

## 2022-02-12 DIAGNOSIS — K222 Esophageal obstruction: Secondary | ICD-10-CM | POA: Diagnosis not present

## 2022-02-12 DIAGNOSIS — E1122 Type 2 diabetes mellitus with diabetic chronic kidney disease: Secondary | ICD-10-CM | POA: Diagnosis not present

## 2022-02-12 DIAGNOSIS — N189 Chronic kidney disease, unspecified: Secondary | ICD-10-CM | POA: Diagnosis not present

## 2022-02-12 DIAGNOSIS — E1151 Type 2 diabetes mellitus with diabetic peripheral angiopathy without gangrene: Secondary | ICD-10-CM | POA: Diagnosis not present

## 2022-02-12 DIAGNOSIS — C180 Malignant neoplasm of cecum: Secondary | ICD-10-CM | POA: Diagnosis not present

## 2022-02-12 DIAGNOSIS — Z79891 Long term (current) use of opiate analgesic: Secondary | ICD-10-CM | POA: Diagnosis not present

## 2022-02-12 DIAGNOSIS — K219 Gastro-esophageal reflux disease without esophagitis: Secondary | ICD-10-CM | POA: Diagnosis not present

## 2022-02-12 DIAGNOSIS — J441 Chronic obstructive pulmonary disease with (acute) exacerbation: Secondary | ICD-10-CM | POA: Diagnosis not present

## 2022-02-12 DIAGNOSIS — J189 Pneumonia, unspecified organism: Secondary | ICD-10-CM | POA: Diagnosis not present

## 2022-02-12 DIAGNOSIS — Z7982 Long term (current) use of aspirin: Secondary | ICD-10-CM | POA: Diagnosis not present

## 2022-02-12 DIAGNOSIS — I69351 Hemiplegia and hemiparesis following cerebral infarction affecting right dominant side: Secondary | ICD-10-CM | POA: Diagnosis not present

## 2022-02-12 NOTE — Telephone Encounter (Signed)
Okay for verbals assuming she keeps visit for tomorrow.

## 2022-02-13 ENCOUNTER — Encounter: Payer: Self-pay | Admitting: Internal Medicine

## 2022-02-13 ENCOUNTER — Ambulatory Visit (INDEPENDENT_AMBULATORY_CARE_PROVIDER_SITE_OTHER): Payer: Medicare HMO | Admitting: Internal Medicine

## 2022-02-13 DIAGNOSIS — J449 Chronic obstructive pulmonary disease, unspecified: Secondary | ICD-10-CM

## 2022-02-13 DIAGNOSIS — R1319 Other dysphagia: Secondary | ICD-10-CM | POA: Diagnosis not present

## 2022-02-13 DIAGNOSIS — E118 Type 2 diabetes mellitus with unspecified complications: Secondary | ICD-10-CM

## 2022-02-13 DIAGNOSIS — J189 Pneumonia, unspecified organism: Secondary | ICD-10-CM | POA: Diagnosis not present

## 2022-02-13 NOTE — Assessment & Plan Note (Signed)
No problems clinically and work up negative for this impacting lungs/aspiration in hospital recently.

## 2022-02-13 NOTE — Telephone Encounter (Signed)
Called [place]. Unable to lvm with verbal orders at [phone] due to it not being a secure voicemail. Office number was provided

## 2022-02-13 NOTE — Telephone Encounter (Signed)
Verbal orders given to [place]. No questions or concerns.

## 2022-02-13 NOTE — Patient Instructions (Signed)
Let us know if the stomach issues do not improve.

## 2022-02-13 NOTE — Progress Notes (Signed)
   Subjective:   Patient ID: Sherry Lambert, female    DOB: 30-Jun-1941, 81 y.o.   MRN: [id number]  HPI The patient is a 81 YO female coming in for follow up and hospital follow up. In for CAP and treated with antibiotics.   Review of Systems  Constitutional:  Positive for activity change and fatigue. Negative for appetite change.  HENT: Negative.    Eyes: Negative.   Respiratory:  Positive for shortness of breath. Negative for cough and chest tightness.   Cardiovascular:  Negative for chest pain, palpitations and leg swelling.  Gastrointestinal:  Negative for abdominal distention, abdominal pain, constipation, diarrhea, nausea and vomiting.  Musculoskeletal:  Positive for gait problem.  Skin: Negative.   Neurological:  Positive for weakness and numbness.  Psychiatric/Behavioral: Negative.      Objective:  Physical Exam Constitutional:      Appearance: She is well-developed.  HENT:     Head: Normocephalic and atraumatic.  Cardiovascular:     Rate and Rhythm: Normal rate and regular rhythm.  Pulmonary:     Effort: Pulmonary effort is normal. No respiratory distress.     Breath sounds: Normal breath sounds. No wheezing or rales.  Abdominal:     General: Bowel sounds are normal. There is no distension.     Palpations: Abdomen is soft.     Tenderness: There is no abdominal tenderness. There is no rebound.  Musculoskeletal:     Cervical back: Normal range of motion.  Skin:    General: Skin is warm and dry.  Neurological:     Mental Status: She is alert and oriented to person, place, and time. Mental status is at baseline.     Sensory: Sensory deficit present.     Coordination: Coordination abnormal.     Comments: Walker at home, wheelchair at visit     Vitals:   02/13/22 0837  BP: 126/68  Pulse: 76  Resp: 18  SpO2: 100%  Weight: 109 lb 6.4 oz (49.6 kg)  Height: '4\' 11"'$  (1.499 m)    Assessment & Plan:

## 2022-02-13 NOTE — Assessment & Plan Note (Signed)
Clinically improving. Still some diarrhea leftover from antibiotics. She will be due CXR repeat timing should be fine for during visit with Dr. Loanne Drilling later this month. Breathing is not back to her baseline but suspect this is some muscle loss (due to illness is down a few pounds).

## 2022-02-13 NOTE — Assessment & Plan Note (Signed)
Overall worse than baseline slightly but improving significantly from in hospital. Follow up with Dr. Loanne Drilling later this month and recommend CXR to follow up pneumonia then.

## 2022-02-13 NOTE — Assessment & Plan Note (Signed)
We decided to wait on HgA1c until Dec 2023 due to persistently controlled and last 6.3 off medications and excessive labs recently in the hospital. She has not noticed low or high sugars.

## 2022-02-14 DIAGNOSIS — K222 Esophageal obstruction: Secondary | ICD-10-CM | POA: Diagnosis not present

## 2022-02-14 DIAGNOSIS — N189 Chronic kidney disease, unspecified: Secondary | ICD-10-CM | POA: Diagnosis not present

## 2022-02-14 DIAGNOSIS — E1122 Type 2 diabetes mellitus with diabetic chronic kidney disease: Secondary | ICD-10-CM | POA: Diagnosis not present

## 2022-02-14 DIAGNOSIS — J189 Pneumonia, unspecified organism: Secondary | ICD-10-CM | POA: Diagnosis not present

## 2022-02-14 DIAGNOSIS — Z79891 Long term (current) use of opiate analgesic: Secondary | ICD-10-CM | POA: Diagnosis not present

## 2022-02-14 DIAGNOSIS — J441 Chronic obstructive pulmonary disease with (acute) exacerbation: Secondary | ICD-10-CM | POA: Diagnosis not present

## 2022-02-14 DIAGNOSIS — J440 Chronic obstructive pulmonary disease with acute lower respiratory infection: Secondary | ICD-10-CM | POA: Diagnosis not present

## 2022-02-14 DIAGNOSIS — R1314 Dysphagia, pharyngoesophageal phase: Secondary | ICD-10-CM | POA: Diagnosis not present

## 2022-02-14 DIAGNOSIS — E1151 Type 2 diabetes mellitus with diabetic peripheral angiopathy without gangrene: Secondary | ICD-10-CM | POA: Diagnosis not present

## 2022-02-14 DIAGNOSIS — K219 Gastro-esophageal reflux disease without esophagitis: Secondary | ICD-10-CM | POA: Diagnosis not present

## 2022-02-14 DIAGNOSIS — Z7982 Long term (current) use of aspirin: Secondary | ICD-10-CM | POA: Diagnosis not present

## 2022-02-14 DIAGNOSIS — I129 Hypertensive chronic kidney disease with stage 1 through stage 4 chronic kidney disease, or unspecified chronic kidney disease: Secondary | ICD-10-CM | POA: Diagnosis not present

## 2022-02-14 DIAGNOSIS — C180 Malignant neoplasm of cecum: Secondary | ICD-10-CM | POA: Diagnosis not present

## 2022-02-14 DIAGNOSIS — Z7951 Long term (current) use of inhaled steroids: Secondary | ICD-10-CM | POA: Diagnosis not present

## 2022-02-14 DIAGNOSIS — I69351 Hemiplegia and hemiparesis following cerebral infarction affecting right dominant side: Secondary | ICD-10-CM | POA: Diagnosis not present

## 2022-02-14 DIAGNOSIS — E785 Hyperlipidemia, unspecified: Secondary | ICD-10-CM | POA: Diagnosis not present

## 2022-02-16 ENCOUNTER — Other Ambulatory Visit: Payer: Self-pay | Admitting: Internal Medicine

## 2022-02-20 DIAGNOSIS — J440 Chronic obstructive pulmonary disease with acute lower respiratory infection: Secondary | ICD-10-CM | POA: Diagnosis not present

## 2022-02-20 DIAGNOSIS — Z7982 Long term (current) use of aspirin: Secondary | ICD-10-CM | POA: Diagnosis not present

## 2022-02-20 DIAGNOSIS — J189 Pneumonia, unspecified organism: Secondary | ICD-10-CM | POA: Diagnosis not present

## 2022-02-20 DIAGNOSIS — I129 Hypertensive chronic kidney disease with stage 1 through stage 4 chronic kidney disease, or unspecified chronic kidney disease: Secondary | ICD-10-CM | POA: Diagnosis not present

## 2022-02-20 DIAGNOSIS — E1122 Type 2 diabetes mellitus with diabetic chronic kidney disease: Secondary | ICD-10-CM | POA: Diagnosis not present

## 2022-02-20 DIAGNOSIS — I69351 Hemiplegia and hemiparesis following cerebral infarction affecting right dominant side: Secondary | ICD-10-CM | POA: Diagnosis not present

## 2022-02-20 DIAGNOSIS — Z79891 Long term (current) use of opiate analgesic: Secondary | ICD-10-CM | POA: Diagnosis not present

## 2022-02-20 DIAGNOSIS — C180 Malignant neoplasm of cecum: Secondary | ICD-10-CM | POA: Diagnosis not present

## 2022-02-20 DIAGNOSIS — E785 Hyperlipidemia, unspecified: Secondary | ICD-10-CM | POA: Diagnosis not present

## 2022-02-20 DIAGNOSIS — E1151 Type 2 diabetes mellitus with diabetic peripheral angiopathy without gangrene: Secondary | ICD-10-CM | POA: Diagnosis not present

## 2022-02-20 DIAGNOSIS — J441 Chronic obstructive pulmonary disease with (acute) exacerbation: Secondary | ICD-10-CM | POA: Diagnosis not present

## 2022-02-20 DIAGNOSIS — N189 Chronic kidney disease, unspecified: Secondary | ICD-10-CM | POA: Diagnosis not present

## 2022-02-20 DIAGNOSIS — K219 Gastro-esophageal reflux disease without esophagitis: Secondary | ICD-10-CM | POA: Diagnosis not present

## 2022-02-20 DIAGNOSIS — R1314 Dysphagia, pharyngoesophageal phase: Secondary | ICD-10-CM | POA: Diagnosis not present

## 2022-02-20 DIAGNOSIS — K222 Esophageal obstruction: Secondary | ICD-10-CM | POA: Diagnosis not present

## 2022-02-20 DIAGNOSIS — Z7951 Long term (current) use of inhaled steroids: Secondary | ICD-10-CM | POA: Diagnosis not present

## 2022-02-21 DIAGNOSIS — J441 Chronic obstructive pulmonary disease with (acute) exacerbation: Secondary | ICD-10-CM | POA: Diagnosis not present

## 2022-02-21 DIAGNOSIS — Z7951 Long term (current) use of inhaled steroids: Secondary | ICD-10-CM | POA: Diagnosis not present

## 2022-02-21 DIAGNOSIS — Z7982 Long term (current) use of aspirin: Secondary | ICD-10-CM | POA: Diagnosis not present

## 2022-02-21 DIAGNOSIS — K219 Gastro-esophageal reflux disease without esophagitis: Secondary | ICD-10-CM | POA: Diagnosis not present

## 2022-02-21 DIAGNOSIS — E1151 Type 2 diabetes mellitus with diabetic peripheral angiopathy without gangrene: Secondary | ICD-10-CM | POA: Diagnosis not present

## 2022-02-21 DIAGNOSIS — E1122 Type 2 diabetes mellitus with diabetic chronic kidney disease: Secondary | ICD-10-CM | POA: Diagnosis not present

## 2022-02-21 DIAGNOSIS — E785 Hyperlipidemia, unspecified: Secondary | ICD-10-CM | POA: Diagnosis not present

## 2022-02-21 DIAGNOSIS — J440 Chronic obstructive pulmonary disease with acute lower respiratory infection: Secondary | ICD-10-CM | POA: Diagnosis not present

## 2022-02-21 DIAGNOSIS — J189 Pneumonia, unspecified organism: Secondary | ICD-10-CM | POA: Diagnosis not present

## 2022-02-21 DIAGNOSIS — N189 Chronic kidney disease, unspecified: Secondary | ICD-10-CM | POA: Diagnosis not present

## 2022-02-21 DIAGNOSIS — K222 Esophageal obstruction: Secondary | ICD-10-CM | POA: Diagnosis not present

## 2022-02-21 DIAGNOSIS — R1314 Dysphagia, pharyngoesophageal phase: Secondary | ICD-10-CM | POA: Diagnosis not present

## 2022-02-21 DIAGNOSIS — C180 Malignant neoplasm of cecum: Secondary | ICD-10-CM | POA: Diagnosis not present

## 2022-02-21 DIAGNOSIS — I129 Hypertensive chronic kidney disease with stage 1 through stage 4 chronic kidney disease, or unspecified chronic kidney disease: Secondary | ICD-10-CM | POA: Diagnosis not present

## 2022-02-21 DIAGNOSIS — Z79891 Long term (current) use of opiate analgesic: Secondary | ICD-10-CM | POA: Diagnosis not present

## 2022-02-21 DIAGNOSIS — I69351 Hemiplegia and hemiparesis following cerebral infarction affecting right dominant side: Secondary | ICD-10-CM | POA: Diagnosis not present

## 2022-02-24 ENCOUNTER — Other Ambulatory Visit: Payer: Self-pay | Admitting: Internal Medicine

## 2022-02-25 DIAGNOSIS — J449 Chronic obstructive pulmonary disease, unspecified: Secondary | ICD-10-CM | POA: Diagnosis not present

## 2022-02-25 DIAGNOSIS — J189 Pneumonia, unspecified organism: Secondary | ICD-10-CM | POA: Diagnosis not present

## 2022-02-26 ENCOUNTER — Encounter: Payer: Self-pay | Admitting: Pulmonary Disease

## 2022-02-26 ENCOUNTER — Ambulatory Visit: Payer: Medicare HMO | Admitting: Pulmonary Disease

## 2022-02-26 ENCOUNTER — Ambulatory Visit (INDEPENDENT_AMBULATORY_CARE_PROVIDER_SITE_OTHER): Payer: Medicare HMO

## 2022-02-26 VITALS — BP 128/62 | HR 84 | Temp 97.9°F | Ht 59.0 in | Wt 109.0 lb

## 2022-02-26 DIAGNOSIS — J449 Chronic obstructive pulmonary disease, unspecified: Secondary | ICD-10-CM

## 2022-02-26 DIAGNOSIS — J189 Pneumonia, unspecified organism: Secondary | ICD-10-CM

## 2022-02-26 MED ORDER — ALBUTEROL SULFATE HFA 108 (90 BASE) MCG/ACT IN AERS: 1.0000 | INHALATION_SPRAY | RESPIRATORY_TRACT | 6 refills | Status: DC | PRN

## 2022-02-27 ENCOUNTER — Encounter: Payer: Self-pay | Admitting: Pulmonary Disease

## 2022-02-28 ENCOUNTER — Other Ambulatory Visit: Payer: Self-pay | Admitting: Internal Medicine

## 2022-03-01 DIAGNOSIS — Z7951 Long term (current) use of inhaled steroids: Secondary | ICD-10-CM | POA: Diagnosis not present

## 2022-03-01 DIAGNOSIS — K219 Gastro-esophageal reflux disease without esophagitis: Secondary | ICD-10-CM | POA: Diagnosis not present

## 2022-03-01 DIAGNOSIS — E785 Hyperlipidemia, unspecified: Secondary | ICD-10-CM | POA: Diagnosis not present

## 2022-03-01 DIAGNOSIS — C180 Malignant neoplasm of cecum: Secondary | ICD-10-CM | POA: Diagnosis not present

## 2022-03-01 DIAGNOSIS — Z79891 Long term (current) use of opiate analgesic: Secondary | ICD-10-CM | POA: Diagnosis not present

## 2022-03-01 DIAGNOSIS — J441 Chronic obstructive pulmonary disease with (acute) exacerbation: Secondary | ICD-10-CM | POA: Diagnosis not present

## 2022-03-01 DIAGNOSIS — E1151 Type 2 diabetes mellitus with diabetic peripheral angiopathy without gangrene: Secondary | ICD-10-CM | POA: Diagnosis not present

## 2022-03-01 DIAGNOSIS — J189 Pneumonia, unspecified organism: Secondary | ICD-10-CM | POA: Diagnosis not present

## 2022-03-01 DIAGNOSIS — I129 Hypertensive chronic kidney disease with stage 1 through stage 4 chronic kidney disease, or unspecified chronic kidney disease: Secondary | ICD-10-CM | POA: Diagnosis not present

## 2022-03-01 DIAGNOSIS — K222 Esophageal obstruction: Secondary | ICD-10-CM | POA: Diagnosis not present

## 2022-03-01 DIAGNOSIS — I69351 Hemiplegia and hemiparesis following cerebral infarction affecting right dominant side: Secondary | ICD-10-CM | POA: Diagnosis not present

## 2022-03-01 DIAGNOSIS — N189 Chronic kidney disease, unspecified: Secondary | ICD-10-CM | POA: Diagnosis not present

## 2022-03-01 DIAGNOSIS — J440 Chronic obstructive pulmonary disease with acute lower respiratory infection: Secondary | ICD-10-CM | POA: Diagnosis not present

## 2022-03-01 DIAGNOSIS — E1122 Type 2 diabetes mellitus with diabetic chronic kidney disease: Secondary | ICD-10-CM | POA: Diagnosis not present

## 2022-03-01 DIAGNOSIS — R1314 Dysphagia, pharyngoesophageal phase: Secondary | ICD-10-CM | POA: Diagnosis not present

## 2022-03-01 DIAGNOSIS — Z7982 Long term (current) use of aspirin: Secondary | ICD-10-CM | POA: Diagnosis not present

## 2022-03-02 DIAGNOSIS — K222 Esophageal obstruction: Secondary | ICD-10-CM | POA: Diagnosis not present

## 2022-03-02 DIAGNOSIS — C180 Malignant neoplasm of cecum: Secondary | ICD-10-CM | POA: Diagnosis not present

## 2022-03-02 DIAGNOSIS — E1151 Type 2 diabetes mellitus with diabetic peripheral angiopathy without gangrene: Secondary | ICD-10-CM | POA: Diagnosis not present

## 2022-03-02 DIAGNOSIS — K219 Gastro-esophageal reflux disease without esophagitis: Secondary | ICD-10-CM | POA: Diagnosis not present

## 2022-03-02 DIAGNOSIS — Z7982 Long term (current) use of aspirin: Secondary | ICD-10-CM | POA: Diagnosis not present

## 2022-03-02 DIAGNOSIS — I69351 Hemiplegia and hemiparesis following cerebral infarction affecting right dominant side: Secondary | ICD-10-CM | POA: Diagnosis not present

## 2022-03-02 DIAGNOSIS — Z79891 Long term (current) use of opiate analgesic: Secondary | ICD-10-CM | POA: Diagnosis not present

## 2022-03-02 DIAGNOSIS — J189 Pneumonia, unspecified organism: Secondary | ICD-10-CM | POA: Diagnosis not present

## 2022-03-02 DIAGNOSIS — E785 Hyperlipidemia, unspecified: Secondary | ICD-10-CM | POA: Diagnosis not present

## 2022-03-02 DIAGNOSIS — J449 Chronic obstructive pulmonary disease, unspecified: Secondary | ICD-10-CM | POA: Diagnosis not present

## 2022-03-02 DIAGNOSIS — E1122 Type 2 diabetes mellitus with diabetic chronic kidney disease: Secondary | ICD-10-CM | POA: Diagnosis not present

## 2022-03-02 DIAGNOSIS — J441 Chronic obstructive pulmonary disease with (acute) exacerbation: Secondary | ICD-10-CM | POA: Diagnosis not present

## 2022-03-02 DIAGNOSIS — J440 Chronic obstructive pulmonary disease with acute lower respiratory infection: Secondary | ICD-10-CM | POA: Diagnosis not present

## 2022-03-02 DIAGNOSIS — N189 Chronic kidney disease, unspecified: Secondary | ICD-10-CM | POA: Diagnosis not present

## 2022-03-02 DIAGNOSIS — I129 Hypertensive chronic kidney disease with stage 1 through stage 4 chronic kidney disease, or unspecified chronic kidney disease: Secondary | ICD-10-CM | POA: Diagnosis not present

## 2022-03-02 DIAGNOSIS — Z7951 Long term (current) use of inhaled steroids: Secondary | ICD-10-CM | POA: Diagnosis not present

## 2022-03-02 DIAGNOSIS — R1314 Dysphagia, pharyngoesophageal phase: Secondary | ICD-10-CM | POA: Diagnosis not present

## 2022-03-05 DIAGNOSIS — J449 Chronic obstructive pulmonary disease, unspecified: Secondary | ICD-10-CM | POA: Diagnosis not present

## 2022-03-05 DIAGNOSIS — J189 Pneumonia, unspecified organism: Secondary | ICD-10-CM | POA: Diagnosis not present

## 2022-03-08 DIAGNOSIS — J441 Chronic obstructive pulmonary disease with (acute) exacerbation: Secondary | ICD-10-CM | POA: Diagnosis not present

## 2022-03-08 DIAGNOSIS — I69351 Hemiplegia and hemiparesis following cerebral infarction affecting right dominant side: Secondary | ICD-10-CM | POA: Diagnosis not present

## 2022-03-08 DIAGNOSIS — Z7951 Long term (current) use of inhaled steroids: Secondary | ICD-10-CM | POA: Diagnosis not present

## 2022-03-08 DIAGNOSIS — I129 Hypertensive chronic kidney disease with stage 1 through stage 4 chronic kidney disease, or unspecified chronic kidney disease: Secondary | ICD-10-CM | POA: Diagnosis not present

## 2022-03-08 DIAGNOSIS — J189 Pneumonia, unspecified organism: Secondary | ICD-10-CM | POA: Diagnosis not present

## 2022-03-08 DIAGNOSIS — K219 Gastro-esophageal reflux disease without esophagitis: Secondary | ICD-10-CM | POA: Diagnosis not present

## 2022-03-08 DIAGNOSIS — K222 Esophageal obstruction: Secondary | ICD-10-CM | POA: Diagnosis not present

## 2022-03-08 DIAGNOSIS — J440 Chronic obstructive pulmonary disease with acute lower respiratory infection: Secondary | ICD-10-CM | POA: Diagnosis not present

## 2022-03-08 DIAGNOSIS — Z79891 Long term (current) use of opiate analgesic: Secondary | ICD-10-CM | POA: Diagnosis not present

## 2022-03-08 DIAGNOSIS — Z7982 Long term (current) use of aspirin: Secondary | ICD-10-CM | POA: Diagnosis not present

## 2022-03-08 DIAGNOSIS — E1122 Type 2 diabetes mellitus with diabetic chronic kidney disease: Secondary | ICD-10-CM | POA: Diagnosis not present

## 2022-03-08 DIAGNOSIS — R1314 Dysphagia, pharyngoesophageal phase: Secondary | ICD-10-CM | POA: Diagnosis not present

## 2022-03-08 DIAGNOSIS — C180 Malignant neoplasm of cecum: Secondary | ICD-10-CM | POA: Diagnosis not present

## 2022-03-08 DIAGNOSIS — E785 Hyperlipidemia, unspecified: Secondary | ICD-10-CM | POA: Diagnosis not present

## 2022-03-08 DIAGNOSIS — E1151 Type 2 diabetes mellitus with diabetic peripheral angiopathy without gangrene: Secondary | ICD-10-CM | POA: Diagnosis not present

## 2022-03-08 DIAGNOSIS — N189 Chronic kidney disease, unspecified: Secondary | ICD-10-CM | POA: Diagnosis not present

## 2022-03-14 DIAGNOSIS — I69351 Hemiplegia and hemiparesis following cerebral infarction affecting right dominant side: Secondary | ICD-10-CM | POA: Diagnosis not present

## 2022-03-14 DIAGNOSIS — Z7951 Long term (current) use of inhaled steroids: Secondary | ICD-10-CM | POA: Diagnosis not present

## 2022-03-14 DIAGNOSIS — Z7982 Long term (current) use of aspirin: Secondary | ICD-10-CM | POA: Diagnosis not present

## 2022-03-14 DIAGNOSIS — E1151 Type 2 diabetes mellitus with diabetic peripheral angiopathy without gangrene: Secondary | ICD-10-CM | POA: Diagnosis not present

## 2022-03-14 DIAGNOSIS — Z79891 Long term (current) use of opiate analgesic: Secondary | ICD-10-CM | POA: Diagnosis not present

## 2022-03-14 DIAGNOSIS — C180 Malignant neoplasm of cecum: Secondary | ICD-10-CM | POA: Diagnosis not present

## 2022-03-14 DIAGNOSIS — I129 Hypertensive chronic kidney disease with stage 1 through stage 4 chronic kidney disease, or unspecified chronic kidney disease: Secondary | ICD-10-CM | POA: Diagnosis not present

## 2022-03-14 DIAGNOSIS — J441 Chronic obstructive pulmonary disease with (acute) exacerbation: Secondary | ICD-10-CM | POA: Diagnosis not present

## 2022-03-14 DIAGNOSIS — K222 Esophageal obstruction: Secondary | ICD-10-CM | POA: Diagnosis not present

## 2022-03-14 DIAGNOSIS — E1122 Type 2 diabetes mellitus with diabetic chronic kidney disease: Secondary | ICD-10-CM | POA: Diagnosis not present

## 2022-03-14 DIAGNOSIS — J189 Pneumonia, unspecified organism: Secondary | ICD-10-CM | POA: Diagnosis not present

## 2022-03-14 DIAGNOSIS — R1314 Dysphagia, pharyngoesophageal phase: Secondary | ICD-10-CM | POA: Diagnosis not present

## 2022-03-14 DIAGNOSIS — K219 Gastro-esophageal reflux disease without esophagitis: Secondary | ICD-10-CM | POA: Diagnosis not present

## 2022-03-14 DIAGNOSIS — E785 Hyperlipidemia, unspecified: Secondary | ICD-10-CM | POA: Diagnosis not present

## 2022-03-14 DIAGNOSIS — J440 Chronic obstructive pulmonary disease with acute lower respiratory infection: Secondary | ICD-10-CM | POA: Diagnosis not present

## 2022-03-14 DIAGNOSIS — N189 Chronic kidney disease, unspecified: Secondary | ICD-10-CM | POA: Diagnosis not present

## 2022-03-15 ENCOUNTER — Telehealth: Payer: Self-pay | Admitting: Pulmonary Disease

## 2022-03-15 DIAGNOSIS — G4734 Idiopathic sleep related nonobstructive alveolar hypoventilation: Secondary | ICD-10-CM

## 2022-03-15 NOTE — Telephone Encounter (Signed)
Overnight Oximetry  ONO study from 03/02/22 reviewed. SpO2 <88% 15 min 44 seconds. Nadir SpO2 83%  Patient qualified for oxygen  Assessment/Plan Nocturnal hypoxemia -  Order 1L O2 nightly  Rodman Pickle, M.D. [facility] 03/15/2022 6:05 PM

## 2022-03-16 NOTE — Telephone Encounter (Signed)
Called patient and went over ONO results with her. Patient verbalized understanding of the oxygen use at night. Orders placed. Nothing further needed

## 2022-03-20 DIAGNOSIS — I69351 Hemiplegia and hemiparesis following cerebral infarction affecting right dominant side: Secondary | ICD-10-CM | POA: Diagnosis not present

## 2022-03-20 DIAGNOSIS — E785 Hyperlipidemia, unspecified: Secondary | ICD-10-CM | POA: Diagnosis not present

## 2022-03-20 DIAGNOSIS — R1314 Dysphagia, pharyngoesophageal phase: Secondary | ICD-10-CM | POA: Diagnosis not present

## 2022-03-20 DIAGNOSIS — I129 Hypertensive chronic kidney disease with stage 1 through stage 4 chronic kidney disease, or unspecified chronic kidney disease: Secondary | ICD-10-CM | POA: Diagnosis not present

## 2022-03-20 DIAGNOSIS — E1151 Type 2 diabetes mellitus with diabetic peripheral angiopathy without gangrene: Secondary | ICD-10-CM | POA: Diagnosis not present

## 2022-03-20 DIAGNOSIS — K222 Esophageal obstruction: Secondary | ICD-10-CM | POA: Diagnosis not present

## 2022-03-20 DIAGNOSIS — J189 Pneumonia, unspecified organism: Secondary | ICD-10-CM | POA: Diagnosis not present

## 2022-03-20 DIAGNOSIS — Z7982 Long term (current) use of aspirin: Secondary | ICD-10-CM | POA: Diagnosis not present

## 2022-03-20 DIAGNOSIS — Z7951 Long term (current) use of inhaled steroids: Secondary | ICD-10-CM | POA: Diagnosis not present

## 2022-03-20 DIAGNOSIS — N189 Chronic kidney disease, unspecified: Secondary | ICD-10-CM | POA: Diagnosis not present

## 2022-03-20 DIAGNOSIS — E1122 Type 2 diabetes mellitus with diabetic chronic kidney disease: Secondary | ICD-10-CM | POA: Diagnosis not present

## 2022-03-20 DIAGNOSIS — C180 Malignant neoplasm of cecum: Secondary | ICD-10-CM | POA: Diagnosis not present

## 2022-03-20 DIAGNOSIS — K219 Gastro-esophageal reflux disease without esophagitis: Secondary | ICD-10-CM | POA: Diagnosis not present

## 2022-03-20 DIAGNOSIS — J441 Chronic obstructive pulmonary disease with (acute) exacerbation: Secondary | ICD-10-CM | POA: Diagnosis not present

## 2022-03-20 DIAGNOSIS — Z79891 Long term (current) use of opiate analgesic: Secondary | ICD-10-CM | POA: Diagnosis not present

## 2022-03-20 DIAGNOSIS — J440 Chronic obstructive pulmonary disease with acute lower respiratory infection: Secondary | ICD-10-CM | POA: Diagnosis not present

## 2022-03-26 DIAGNOSIS — J440 Chronic obstructive pulmonary disease with acute lower respiratory infection: Secondary | ICD-10-CM | POA: Diagnosis not present

## 2022-03-26 DIAGNOSIS — I129 Hypertensive chronic kidney disease with stage 1 through stage 4 chronic kidney disease, or unspecified chronic kidney disease: Secondary | ICD-10-CM | POA: Diagnosis not present

## 2022-03-26 DIAGNOSIS — Z7951 Long term (current) use of inhaled steroids: Secondary | ICD-10-CM | POA: Diagnosis not present

## 2022-03-26 DIAGNOSIS — Z79891 Long term (current) use of opiate analgesic: Secondary | ICD-10-CM | POA: Diagnosis not present

## 2022-03-26 DIAGNOSIS — R1314 Dysphagia, pharyngoesophageal phase: Secondary | ICD-10-CM | POA: Diagnosis not present

## 2022-03-26 DIAGNOSIS — J441 Chronic obstructive pulmonary disease with (acute) exacerbation: Secondary | ICD-10-CM | POA: Diagnosis not present

## 2022-03-26 DIAGNOSIS — J189 Pneumonia, unspecified organism: Secondary | ICD-10-CM | POA: Diagnosis not present

## 2022-03-26 DIAGNOSIS — E1151 Type 2 diabetes mellitus with diabetic peripheral angiopathy without gangrene: Secondary | ICD-10-CM | POA: Diagnosis not present

## 2022-03-26 DIAGNOSIS — K219 Gastro-esophageal reflux disease without esophagitis: Secondary | ICD-10-CM | POA: Diagnosis not present

## 2022-03-26 DIAGNOSIS — Z7982 Long term (current) use of aspirin: Secondary | ICD-10-CM | POA: Diagnosis not present

## 2022-03-26 DIAGNOSIS — E785 Hyperlipidemia, unspecified: Secondary | ICD-10-CM | POA: Diagnosis not present

## 2022-03-26 DIAGNOSIS — K222 Esophageal obstruction: Secondary | ICD-10-CM | POA: Diagnosis not present

## 2022-03-26 DIAGNOSIS — C180 Malignant neoplasm of cecum: Secondary | ICD-10-CM | POA: Diagnosis not present

## 2022-03-26 DIAGNOSIS — I69351 Hemiplegia and hemiparesis following cerebral infarction affecting right dominant side: Secondary | ICD-10-CM | POA: Diagnosis not present

## 2022-03-26 DIAGNOSIS — E1122 Type 2 diabetes mellitus with diabetic chronic kidney disease: Secondary | ICD-10-CM | POA: Diagnosis not present

## 2022-03-26 DIAGNOSIS — N189 Chronic kidney disease, unspecified: Secondary | ICD-10-CM | POA: Diagnosis not present

## 2022-03-27 ENCOUNTER — Telehealth: Payer: Medicare HMO

## 2022-03-27 DIAGNOSIS — J449 Chronic obstructive pulmonary disease, unspecified: Secondary | ICD-10-CM | POA: Diagnosis not present

## 2022-03-27 DIAGNOSIS — J189 Pneumonia, unspecified organism: Secondary | ICD-10-CM | POA: Diagnosis not present

## 2022-03-28 DIAGNOSIS — N189 Chronic kidney disease, unspecified: Secondary | ICD-10-CM | POA: Diagnosis not present

## 2022-03-28 DIAGNOSIS — K219 Gastro-esophageal reflux disease without esophagitis: Secondary | ICD-10-CM | POA: Diagnosis not present

## 2022-03-28 DIAGNOSIS — E1151 Type 2 diabetes mellitus with diabetic peripheral angiopathy without gangrene: Secondary | ICD-10-CM | POA: Diagnosis not present

## 2022-03-28 DIAGNOSIS — C180 Malignant neoplasm of cecum: Secondary | ICD-10-CM | POA: Diagnosis not present

## 2022-03-28 DIAGNOSIS — Z7982 Long term (current) use of aspirin: Secondary | ICD-10-CM | POA: Diagnosis not present

## 2022-03-28 DIAGNOSIS — R1314 Dysphagia, pharyngoesophageal phase: Secondary | ICD-10-CM | POA: Diagnosis not present

## 2022-03-28 DIAGNOSIS — I129 Hypertensive chronic kidney disease with stage 1 through stage 4 chronic kidney disease, or unspecified chronic kidney disease: Secondary | ICD-10-CM | POA: Diagnosis not present

## 2022-03-28 DIAGNOSIS — E1122 Type 2 diabetes mellitus with diabetic chronic kidney disease: Secondary | ICD-10-CM | POA: Diagnosis not present

## 2022-03-28 DIAGNOSIS — Z7951 Long term (current) use of inhaled steroids: Secondary | ICD-10-CM | POA: Diagnosis not present

## 2022-03-28 DIAGNOSIS — I69351 Hemiplegia and hemiparesis following cerebral infarction affecting right dominant side: Secondary | ICD-10-CM | POA: Diagnosis not present

## 2022-03-28 DIAGNOSIS — J189 Pneumonia, unspecified organism: Secondary | ICD-10-CM | POA: Diagnosis not present

## 2022-03-28 DIAGNOSIS — K222 Esophageal obstruction: Secondary | ICD-10-CM | POA: Diagnosis not present

## 2022-03-28 DIAGNOSIS — Z79891 Long term (current) use of opiate analgesic: Secondary | ICD-10-CM | POA: Diagnosis not present

## 2022-03-28 DIAGNOSIS — J440 Chronic obstructive pulmonary disease with acute lower respiratory infection: Secondary | ICD-10-CM | POA: Diagnosis not present

## 2022-03-28 DIAGNOSIS — E785 Hyperlipidemia, unspecified: Secondary | ICD-10-CM | POA: Diagnosis not present

## 2022-03-28 DIAGNOSIS — J441 Chronic obstructive pulmonary disease with (acute) exacerbation: Secondary | ICD-10-CM | POA: Diagnosis not present

## 2022-04-02 DIAGNOSIS — E1122 Type 2 diabetes mellitus with diabetic chronic kidney disease: Secondary | ICD-10-CM | POA: Diagnosis not present

## 2022-04-02 DIAGNOSIS — Z7951 Long term (current) use of inhaled steroids: Secondary | ICD-10-CM | POA: Diagnosis not present

## 2022-04-02 DIAGNOSIS — C180 Malignant neoplasm of cecum: Secondary | ICD-10-CM | POA: Diagnosis not present

## 2022-04-02 DIAGNOSIS — K222 Esophageal obstruction: Secondary | ICD-10-CM | POA: Diagnosis not present

## 2022-04-02 DIAGNOSIS — K219 Gastro-esophageal reflux disease without esophagitis: Secondary | ICD-10-CM | POA: Diagnosis not present

## 2022-04-02 DIAGNOSIS — R1314 Dysphagia, pharyngoesophageal phase: Secondary | ICD-10-CM | POA: Diagnosis not present

## 2022-04-02 DIAGNOSIS — J189 Pneumonia, unspecified organism: Secondary | ICD-10-CM | POA: Diagnosis not present

## 2022-04-02 DIAGNOSIS — J441 Chronic obstructive pulmonary disease with (acute) exacerbation: Secondary | ICD-10-CM | POA: Diagnosis not present

## 2022-04-02 DIAGNOSIS — N189 Chronic kidney disease, unspecified: Secondary | ICD-10-CM | POA: Diagnosis not present

## 2022-04-02 DIAGNOSIS — E785 Hyperlipidemia, unspecified: Secondary | ICD-10-CM | POA: Diagnosis not present

## 2022-04-02 DIAGNOSIS — I129 Hypertensive chronic kidney disease with stage 1 through stage 4 chronic kidney disease, or unspecified chronic kidney disease: Secondary | ICD-10-CM | POA: Diagnosis not present

## 2022-04-02 DIAGNOSIS — I69351 Hemiplegia and hemiparesis following cerebral infarction affecting right dominant side: Secondary | ICD-10-CM | POA: Diagnosis not present

## 2022-04-02 DIAGNOSIS — E1151 Type 2 diabetes mellitus with diabetic peripheral angiopathy without gangrene: Secondary | ICD-10-CM | POA: Diagnosis not present

## 2022-04-02 DIAGNOSIS — Z7982 Long term (current) use of aspirin: Secondary | ICD-10-CM | POA: Diagnosis not present

## 2022-04-02 DIAGNOSIS — Z79891 Long term (current) use of opiate analgesic: Secondary | ICD-10-CM | POA: Diagnosis not present

## 2022-04-02 DIAGNOSIS — J440 Chronic obstructive pulmonary disease with acute lower respiratory infection: Secondary | ICD-10-CM | POA: Diagnosis not present

## 2022-04-05 DIAGNOSIS — J189 Pneumonia, unspecified organism: Secondary | ICD-10-CM | POA: Diagnosis not present

## 2022-04-05 DIAGNOSIS — J449 Chronic obstructive pulmonary disease, unspecified: Secondary | ICD-10-CM | POA: Diagnosis not present

## 2022-04-19 ENCOUNTER — Other Ambulatory Visit: Payer: Self-pay | Admitting: Internal Medicine

## 2022-04-25 ENCOUNTER — Other Ambulatory Visit: Payer: Self-pay

## 2022-04-25 ENCOUNTER — Telehealth: Payer: Self-pay

## 2022-04-25 MED ORDER — PRAVASTATIN SODIUM 20 MG PO TABS: 20.0000 mg | ORAL_TABLET | Freq: Every day | ORAL | 0 refills | Status: DC

## 2022-04-25 NOTE — Telephone Encounter (Signed)
Pt is requesting a refill: pravastatin (PRAVACHOL) 20 MG tablet  Pharmacy: [facility] [place], [place] 02/13/22 ROV 08/15/22

## 2022-04-27 DIAGNOSIS — J189 Pneumonia, unspecified organism: Secondary | ICD-10-CM | POA: Diagnosis not present

## 2022-04-27 DIAGNOSIS — J449 Chronic obstructive pulmonary disease, unspecified: Secondary | ICD-10-CM | POA: Diagnosis not present

## 2022-05-04 ENCOUNTER — Other Ambulatory Visit: Payer: Self-pay | Admitting: Internal Medicine

## 2022-05-06 DIAGNOSIS — J449 Chronic obstructive pulmonary disease, unspecified: Secondary | ICD-10-CM | POA: Diagnosis not present

## 2022-05-06 DIAGNOSIS — J189 Pneumonia, unspecified organism: Secondary | ICD-10-CM | POA: Diagnosis not present

## 2022-05-10 DIAGNOSIS — H02834 Dermatochalasis of left upper eyelid: Secondary | ICD-10-CM | POA: Diagnosis not present

## 2022-05-10 DIAGNOSIS — H43392 Other vitreous opacities, left eye: Secondary | ICD-10-CM | POA: Diagnosis not present

## 2022-05-10 DIAGNOSIS — E119 Type 2 diabetes mellitus without complications: Secondary | ICD-10-CM | POA: Diagnosis not present

## 2022-05-10 DIAGNOSIS — H04123 Dry eye syndrome of bilateral lacrimal glands: Secondary | ICD-10-CM | POA: Diagnosis not present

## 2022-05-10 DIAGNOSIS — H02831 Dermatochalasis of right upper eyelid: Secondary | ICD-10-CM | POA: Diagnosis not present

## 2022-05-10 DIAGNOSIS — Z961 Presence of intraocular lens: Secondary | ICD-10-CM | POA: Diagnosis not present

## 2022-05-10 DIAGNOSIS — H0102B Squamous blepharitis left eye, upper and lower eyelids: Secondary | ICD-10-CM | POA: Diagnosis not present

## 2022-05-10 DIAGNOSIS — H4323 Crystalline deposits in vitreous body, bilateral: Secondary | ICD-10-CM | POA: Diagnosis not present

## 2022-05-10 DIAGNOSIS — H0102A Squamous blepharitis right eye, upper and lower eyelids: Secondary | ICD-10-CM | POA: Diagnosis not present

## 2022-05-12 ENCOUNTER — Other Ambulatory Visit: Payer: Self-pay | Admitting: Family Medicine

## 2022-05-12 MED ORDER — GABAPENTIN 300 MG PO CAPS
300.0000 mg | ORAL_CAPSULE | Freq: Three times a day (TID) | ORAL | 0 refills | Status: DC
Start: 2022-05-12 — End: 2022-05-14

## 2022-05-12 NOTE — Progress Notes (Signed)
Needed gabapentin rx sent locally, couldn't get mailorder rx in time.  Sent #30 tabs for 10 days to [facility], [place].  Routed to PCP as FYI.

## 2022-05-14 ENCOUNTER — Telehealth: Payer: Self-pay

## 2022-05-14 ENCOUNTER — Other Ambulatory Visit: Payer: Self-pay | Admitting: Family Medicine

## 2022-05-14 ENCOUNTER — Telehealth: Payer: Self-pay | Admitting: Internal Medicine

## 2022-05-14 MED ORDER — GABAPENTIN 300 MG PO CAPS: 300.0000 mg | ORAL_CAPSULE | Freq: Three times a day (TID) | ORAL | 1 refills | Status: DC

## 2022-05-14 NOTE — Telephone Encounter (Signed)
Patient would like her pharmacy changed back to [facility] in [place] - Please send all refills there.

## 2022-05-14 NOTE — Telephone Encounter (Signed)
Pt is requesting a refill for: gabapentin (NEURONTIN) 300 MG capsule  Pharmacy: [facility], [place] 02/13/22 ROV 08/15/22.  Pt thought that she was suppose to be taking 2 CAP in the morning and 1 in the afternoon and 1 in the evening per the last visit with Dr. [name]. I advised the pt that the Rx states take 1 TID.   Please clarity.   Pt states that she will start taking 1 TID as written on the Rx. Pt was out over the weekend and Dr. Damita Dunnings sent in a 10 day supply until she could reach out to Dr. [name] and get it refill.  Please advise

## 2022-05-14 NOTE — Telephone Encounter (Signed)
Contacted pt to clarify just 1 gabapentin TID. Pt states that she will start taking that as directed. Also advised her that an Rx was sent in for the gabapentin to Pharmacy requested.  Pt understood and had no further questions

## 2022-05-14 NOTE — Telephone Encounter (Signed)
Back in Dec 2022 we talked about increasing gabapentin but decided to add baclofen instead. To my understanding it should be 1 pill TID for her gabapentin.

## 2022-05-16 ENCOUNTER — Ambulatory Visit (INDEPENDENT_AMBULATORY_CARE_PROVIDER_SITE_OTHER): Payer: Medicare HMO

## 2022-05-16 ENCOUNTER — Telehealth: Payer: Self-pay | Admitting: Student

## 2022-05-16 ENCOUNTER — Encounter: Payer: Self-pay | Admitting: Student

## 2022-05-16 ENCOUNTER — Ambulatory Visit: Payer: Medicare HMO | Admitting: Student

## 2022-05-16 ENCOUNTER — Other Ambulatory Visit: Payer: Self-pay | Admitting: Internal Medicine

## 2022-05-16 ENCOUNTER — Other Ambulatory Visit (HOSPITAL_COMMUNITY): Payer: Self-pay

## 2022-05-16 VITALS — BP 130/60 | HR 83 | Ht 59.0 in

## 2022-05-16 DIAGNOSIS — K449 Diaphragmatic hernia without obstruction or gangrene: Secondary | ICD-10-CM | POA: Diagnosis not present

## 2022-05-16 DIAGNOSIS — R0600 Dyspnea, unspecified: Secondary | ICD-10-CM | POA: Diagnosis not present

## 2022-05-16 MED ORDER — TIOTROPIUM BROMIDE-OLODATEROL 2.5-2.5 MCG/ACT IN AERS
2.0000 | INHALATION_SPRAY | Freq: Every day | RESPIRATORY_TRACT | 0 refills | Status: DC
Start: 2022-05-16 — End: 2022-07-12

## 2022-05-16 MED ORDER — PREDNISONE 10 MG PO TABS: ORAL_TABLET | ORAL | 0 refills | Status: DC

## 2022-05-16 NOTE — Patient Instructions (Addendum)
-   x ray today - prednisone taper, if getting no better by end of the week let us know or if you develop productive cough - try stiolto 2 puffs once daily - I'll send message to pharmacy to see if this is affordable for you. If it's affordable and you prefer it to anoro let us know and we'll fill prescription for it.  - albuterol as needed - see you in December or sooner if need be!

## 2022-05-16 NOTE — Telephone Encounter (Signed)
What is least expensive laba/lama for her?  Thanks!

## 2022-05-16 NOTE — Progress Notes (Signed)
Synopsis: Referred in 2017 for COPD by [name], *  Subjective:   PATIENT ID: Sherry Lambert: female DOB: 1941-04-08, MRN: [id number]  Chief Complaint  Patient presents with   Follow-up    Issues breathing, can't get deep breath   [name] is a 81 year old female former smoker with COPD, hx CVA in 2016 who presents for follow-up.   Synopsis: She is a former patient of Dr. Carlis Abbott. She was diagnosed with COPD in 2016. Her last COPD exacerbation was one year ago Feb 2021 requiring hospitalization. She transitioned from Trelegy to [facility] in March 2022 due to thrush and reports symptoms remain well-controlled.  12/20/20 Since our last visit, she reports her symptoms are well contolled on Anoro. [name] has resolved off inhaled corticosteroids. After sitting three hours outside, she used her Albuterol yesterday for shortness of breath and nasal congestion and believes this is related to the pollen. Otherwise she does routinely use her rescue inhaler. Denies wheezing or cough.  10/10/21 Since our last visit she is compliant with Anoro. She is ambulatory with a walker at home. In the last two weeks she has had worsening shortness of breath and has had to use albuterol once a day. No coughing or wheezing. No fevers or chills. Her last exacerbation was in 05/2021 which was treated with doxycycline. She will do 30 min exercise twice a week with a TV program and the exercises will vary aerobic to strengthening.  02/26/22 Since our last visit she was hospitalized in June for pneumonia. No evidence of aspiration. Improved on antibiotics. [name] with exertion. No wheezing or cough.  05/16/22 Still taking anoro 1 puff once daily. She says she's had more dyspnea over last several days. A little chest tightness. No cough. No CP. No hemoptysis. Feels like AECOPD to her. She has no orthopnea. Has actually lost a little weight down to 107 lb.   Prior inhalers: Trelegy -  thrush  Social history: Quit in 2004. 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   [name]    Cataract    Chronic kidney disease    kidney stone   Colitis    Colon cancer ([facility]) 06/2017   cecum   Diabetes mellitus without complication ([facility])    no meds taken   Emphysema of lung ([facility])    GERD (gastroesophageal reflux disease)    Hyperlipidemia    Hypertension    Peripheral arterial disease ([place])    [name]    Stroke [facility])    November 2016- Thanksgiving night     Allergies  Allergen Reactions   Lipitor [Atorvastatin] Itching     Outpatient Medications Prior to Visit  Medication Sig Dispense Refill   acetaminophen (TYLENOL) 500 MG tablet Take 500 mg by mouth daily.      albuterol (VENTOLIN HFA) 108 (90 Base) MCG/ACT inhaler Inhale 1-2 puffs into the lungs every 4 (four) hours as needed for wheezing or shortness of breath. 18 g 6   aspirin EC 81 MG tablet Take 1 tablet (81 mg total) by mouth daily. 90 tablet 3   gabapentin (NEURONTIN) 300 MG capsule Take 1 capsule (300 mg total) by mouth 3 (three) times daily. 270 capsule 1   omeprazole (PRILOSEC) 40 MG capsule Take 1 capsule by mouth once daily 90 capsule 0   Polyvinyl Alcohol-Povidone (REFRESH OP) Place 1 drop into both eyes daily as needed (  For dry eyes).     pravastatin (PRAVACHOL) 20 MG tablet Take 1 tablet (20 mg total) by mouth daily. 90 tablet 0   traMADol (ULTRAM) 50 MG tablet Take 1 tablet (50 mg total) by mouth daily as needed. (Patient taking differently: Take 50 mg by mouth daily as needed for moderate pain.) 30 tablet 5   umeclidinium-vilanterol (ANORO ELLIPTA) 62.5-25 MCG/INH AEPB Inhale 1 puff into the lungs daily. 30 each 11   VITAMIN D PO Take 1 tablet by mouth daily.     albuterol (PROVENTIL) (2.5 MG/3ML) 0.083% nebulizer solution Take 3 mLs (2.5 mg total) by nebulization every 6 (six) hours as needed for wheezing or shortness of breath.  (Patient not taking: Reported on 05/16/2022) 360 mL 5   No facility-administered medications prior to visit.    Review of Systems  Constitutional:  Positive for weight loss. Negative for chills, diaphoresis, fever and malaise/fatigue.  HENT:  Negative for congestion.   Respiratory:  Positive for shortness of breath. Negative for cough, hemoptysis, sputum production and wheezing.   Cardiovascular:  Negative for chest pain, palpitations and leg swelling.     Objective:   Vitals:   05/16/22 0929  BP: 130/60  Pulse: 83  SpO2: 95%  Height: '4\' 11"'$  (1.499 m)   95%  BMI Readings from Last 3 Encounters:  05/16/22 22.02 kg/m  02/26/22 22.02 kg/m  02/13/22 22.10 kg/m   Wt Readings from Last 3 Encounters:  02/26/22 109 lb (49.4 kg)  02/13/22 109 lb 6.4 oz (49.6 kg)  02/05/22 111 lb 5.3 oz (50.5 kg)   Physical Exam: General: Frail and chronically ill-appearing, no acute distress HENT: [place], AT Eyes: EOMI, no scleral icterus Respiratory: Diminished but clear to auscultation bilaterally.  No crackles, wheezing or rales Cardiovascular: RRR, -M/R/G, no JVD Extremities:-Edema,-tenderness Neuro: AAO x4, CNII-XII grossly intact Psych: Normal mood, normal affect  Chest Imaging- films reviewed: CTA Chest 10/27/2019-emphysema, bilateral dependent pleural effusions.  No PE, no significant mediastinal or hilar adenopathy.  Hernia with stomach and abdominal fat contents protruding into the mediastinum. CXR 05/22/21 - COPD. No infiltrate effusion or edema CXR 02/26/22 - Resolution of lower lobe infiltrates  CXR 05/16/22 reviewed by me underpenetrated relative to prior but no pneumonia  Pulmonary Functions Testing Results:    Latest Ref Rng & Units 11/01/2020    3:05 PM 02/13/2016    1:04 PM  PFT Results  FVC-Pre L 1.82  1.82   FVC-Predicted Pre % 82  76   FVC-Post L 1.80  2.02   FVC-Predicted Post % 81  84   Pre FEV1/FVC % % 48  45   Post FEV1/FCV % % 46  47   FEV1-Pre L 0.87  0.82    FEV1-Predicted Pre % 52  46   FEV1-Post L 0.83  0.94   DLCO uncorrected ml/min/mmHg 9.89  9.75   DLCO UNC% % 60  51   DLCO corrected ml/min/mmHg 9.89  9.26   DLCO COR %Predicted % 60  49   DLVA Predicted % 80  64   TLC L 5.04    TLC % Predicted % 113    RV % Predicted % 159     2017- severe obstruction with out significant bronchodilator reversibility.  Moderate diffusion impairment.  Flow volume loop supports obstruction.  2019 spirometry: FVC 1.8 (75%) FEV1 0.7 (39%) Ratio 38%  PFT 11/01/20 FVC 1.8 (81%) FEV1 0.83 (50%) Ratio 48  TLC 113% RV 159% DLCO 60% Interpretation: Moderately severe obstructive defect  with air trapping and reduced DLCO consistent with emphysema.     Assessment & Plan:  81 year old female with COPD with emphysema who presents for follow-up. Last COPD hospitalization: 02/2022. Has had multiple outpatient exacerbations in the last year.  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   Other chronic issues not addressed today:  Allergic Rhinitis - improved --Claritin as needed  Health Maintenance Immunization History  Administered Date(s) Administered   Fluad Quad(high Dose 65+) 06/18/2019, 07/13/2020, 07/19/2021   Influenza, High Dose Seasonal PF 07/27/2013, 05/14/2016, 05/20/2017, 06/05/2018   Influenza,inj,Quad PF,6+ Mos 08/03/2014, 06/21/2015   Influenza,inj,quad, With Preservative 06/04/2019   Influenza-Unspecified 07/04/2012   PFIZER(Purple Top)SARS-COV-2 Vaccination 11/17/2019, 12/08/2019, 08/17/2020   Pneumococcal Conjugate-13 11/11/2015   Pneumococcal-Unspecified 09/03/2010   Td 09/03/2012   CT Lung Screening - not a  candidate. Quit smoking > 15 years ago  RTC December with Royetta Crochet, MD [facility] 05/16/2022 9:52 AM

## 2022-05-17 ENCOUNTER — Other Ambulatory Visit (HOSPITAL_COMMUNITY): Payer: Self-pay

## 2022-05-17 NOTE — Telephone Encounter (Signed)
The patient pick up Anoro for 47.00  on 05/14/2022 [employer] was non formulary  Stiolto  was non formulary as well

## 2022-05-17 NOTE — Telephone Encounter (Signed)
Ok to order [facility] ONE puff ONCE a day if patient. Please update patient that other inhalers of this type are not covered by her insurance.

## 2022-05-28 DIAGNOSIS — J449 Chronic obstructive pulmonary disease, unspecified: Secondary | ICD-10-CM | POA: Diagnosis not present

## 2022-05-28 DIAGNOSIS — J189 Pneumonia, unspecified organism: Secondary | ICD-10-CM | POA: Diagnosis not present

## 2022-05-29 ENCOUNTER — Ambulatory Visit: Payer: Medicare HMO | Admitting: Pulmonary Disease

## 2022-05-29 ENCOUNTER — Telehealth: Payer: Self-pay | Admitting: Pulmonary Disease

## 2022-05-29 NOTE — Telephone Encounter (Signed)
Called and spoke with patient. She stated that she woke up with a productive cough this morning. She has been coughing up thick green phlegm. She has noticed a slight increase in wheezing. Denies any fever or recent sick exposures. She also stated that she just finished up a prednisone taper that Dr. Verlee Monte prescribed on 09/13.   She has been taking OTC Tylenol and Vitamin D. She confirmed that she has been using her albuterol only as needed and her Anoro daily.   Pharmacy is [facility] in [place].   Dr. Loanne Drilling, can you please advise? Thanks!

## 2022-05-29 NOTE — Telephone Encounter (Signed)
[facility] Telephone Encounter  Called patient regarding productive cough. Has sputum but no longer coughing or wheezing. Overall symptoms improved since her treatment of exacerbation earlier this month with steroids. She has not been able to use Stiolto. Restarted Anoro today which she prefers.   No indication for steroids or antibiotics.  Rodman Pickle, M.D. [facility]/Critical Care Medicine 05/29/2022 1:28 PM

## 2022-06-04 ENCOUNTER — Telehealth: Payer: Self-pay | Admitting: Pulmonary Disease

## 2022-06-05 DIAGNOSIS — J189 Pneumonia, unspecified organism: Secondary | ICD-10-CM | POA: Diagnosis not present

## 2022-06-05 DIAGNOSIS — J449 Chronic obstructive pulmonary disease, unspecified: Secondary | ICD-10-CM | POA: Diagnosis not present

## 2022-06-06 NOTE — Telephone Encounter (Unsigned)
Magdalen Spatz, NP to [place], Waldemar Dickens, CMA  [facility] Triage [facility]      06/06/22  3:03 PM  Please send in Prednisone taper; 10 mg tablets: 4 tabs x 2 days, 3 tabs x 2 days, 2 tabs x 2 days 1 tab x 2 days then stop.  She must be seen in the office within the next 2 weeks to ensure she is getting better. Please schedule OV with [place] or available provider. Thanks so much   Called the pt and there was no answer and no VM ever picked up. Will call back.

## 2022-06-06 NOTE — Telephone Encounter (Signed)
Called and spoke with pt who states she is still having problems with increased SOB. Pt denies any complaints of coughing or wheezing.  Pt just had an acute visit 9/13 with Dr. Verlee Monte and was prescribed prednisone which she said helped with her symptoms but then once finished with Rx, SOB began to get worse again.  Pt is wanting to know if another round of prednisone could be sent to the pharmacy for her a she feels like that would help. Sarah, please advise.

## 2022-06-07 MED ORDER — PREDNISONE 10 MG PO TABS: ORAL_TABLET | ORAL | 0 refills | Status: AC

## 2022-06-07 NOTE — Telephone Encounter (Signed)
Patient calling back stating prednisone wasn't called in- please advise.

## 2022-06-07 NOTE — Telephone Encounter (Signed)
Called and spoke with patient. RX sent.   Nothing further needed.

## 2022-06-25 ENCOUNTER — Telehealth: Payer: Self-pay | Admitting: Pulmonary Disease

## 2022-06-25 MED ORDER — IPRATROPIUM-ALBUTEROL 0.5-2.5 (3) MG/3ML IN SOLN: 3.0000 mL | Freq: Four times a day (QID) | RESPIRATORY_TRACT | 2 refills | Status: DC | PRN

## 2022-06-25 NOTE — Telephone Encounter (Signed)
Pt states she finished her 2nd round of Prednisone about 2 weeks ago and she has experienced SOB with exertion ever since.Pt was put on 2nd round of prednisone by Dr. Verlee Monte.  Pt also states that she is using 1 Liter of O2 at night but for the past 3 days she has kept the O2 on til noon for some relief of SOB. Pt denies fever, chills or GI issues. Please advise.

## 2022-06-25 NOTE — Telephone Encounter (Signed)
She reports she is feels bad in the mornings and unable tot take a deep breath. Will wear oxygen and use albuterol nebulizer with some improvement. Does not check O2 level. Denies cough, wheezing. Overall better compared to our more recent phone calls however persistent shortness of breath daily at baseline despite daily Anoro and albuterol 1x daily. 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

## 2022-06-27 DIAGNOSIS — J189 Pneumonia, unspecified organism: Secondary | ICD-10-CM | POA: Diagnosis not present

## 2022-06-27 DIAGNOSIS — J449 Chronic obstructive pulmonary disease, unspecified: Secondary | ICD-10-CM | POA: Diagnosis not present

## 2022-06-28 ENCOUNTER — Encounter: Payer: Self-pay | Admitting: Pulmonary Disease

## 2022-06-28 ENCOUNTER — Ambulatory Visit: Payer: Medicare HMO | Admitting: Pulmonary Disease

## 2022-06-28 ENCOUNTER — Telehealth: Payer: Self-pay | Admitting: Pulmonary Disease

## 2022-06-28 VITALS — BP 118/66 | HR 84 | Temp 97.6°F | Ht 59.0 in | Wt 118.0 lb

## 2022-06-28 DIAGNOSIS — J449 Chronic obstructive pulmonary disease, unspecified: Secondary | ICD-10-CM | POA: Diagnosis not present

## 2022-06-28 MED ORDER — PREDNISONE 5 MG PO TABS: 5.0000 mg | ORAL_TABLET | Freq: Every day | ORAL | 1 refills | Status: DC

## 2022-06-28 NOTE — Telephone Encounter (Signed)
Spoke with the pt  She states woke up again today feeling as if she is unable to take a good, deep breath  She feels like the Duoneb is not helping like it was on the first day she tried this  OV with BI at 2:15 today, advised to arrive by 2 pm  Nothing further needed

## 2022-06-28 NOTE — Progress Notes (Signed)
Synopsis: Referred in 2017 for COPD by [name], *  Subjective:   PATIENT ID: Sherry Lambert: female DOB: 1941/07/25, MRN: [id number]  Chief Complaint  Patient presents with   Acute Visit    C/o dyspnea and SOB x 1 month. Sx increase x 1 week   [name] is a 81 year old female former smoker with COPD, hx CVA in 2016 who presents for follow-up.   Synopsis: She is a former patient of Dr. Carlis Abbott. She was diagnosed with COPD in 2016. Her last COPD exacerbation was one year ago Feb 2021 requiring hospitalization. She transitioned from Trelegy to [facility] in March 2022 due to thrush and reports symptoms remain well-controlled.  12/20/20 Since our last visit, she reports her symptoms are well contolled on Anoro. [name] has resolved off inhaled corticosteroids. After sitting three hours outside, she used her Albuterol yesterday for shortness of breath and nasal congestion and believes this is related to the pollen. Otherwise she does routinely use her rescue inhaler. Denies wheezing or cough.  10/10/21 Since our last visit she is compliant with Anoro. She is ambulatory with a walker at home. In the last two weeks she has had worsening shortness of breath and has had to use albuterol once a day. No coughing or wheezing. No fevers or chills. Her last exacerbation was in 05/2021 which was treated with doxycycline. She will do 30 min exercise twice a week with a TV program and the exercises will vary aerobic to strengthening.  02/26/22 Since our last visit she was hospitalized in June for pneumonia. No evidence of aspiration. Improved on antibiotics. [name] with exertion. No wheezing or cough.  05/16/22 Still taking anoro 1 puff once daily. She says she's had more dyspnea over last several days. A little chest tightness. No cough. No CP. No hemoptysis. Feels like AECOPD to her. She has no orthopnea. Has actually lost a little weight down to 107 lb.    06/28/2022: Patient here today for acute visit.  Increased shortness of breath.  She was on Trelegy before developed thrush.  Use Stiolto had trouble using the device.  Went back to [employer].  Still has breathlessness and shortness of breath.  She wants to know if she can take prednisone daily because she feels much better on this.  She is very tearful at times.  She is having a lot of difficulty and feels much more weak specially with her stroke history.  Prior inhalers: Trelegy - thrush  Social history: Quit in 2004. 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   [name]    Cataract    Chronic kidney disease    kidney stone   Colitis    Colon cancer ([place]) 06/2017   cecum   Diabetes mellitus without complication ([place])    no meds taken   Emphysema of lung ([facility])    GERD (gastroesophageal reflux disease)    Hyperlipidemia    Hypertension    Peripheral arterial disease ([place])    [name]    Stroke [facility])    November 2016- Thanksgiving night     Allergies  Allergen Reactions   Lipitor [Atorvastatin] Itching     Outpatient Medications Prior to Visit  Medication Sig Dispense Refill   acetaminophen (TYLENOL) 500 MG tablet Take 500 mg by mouth daily.      albuterol (VENTOLIN HFA) 108 (90 Base) MCG/ACT  inhaler Inhale 1-2 puffs into the lungs every 4 (four) hours as needed for wheezing or shortness of breath. 18 g 6   aspirin EC 81 MG tablet Take 1 tablet (81 mg total) by mouth daily. 90 tablet 3   gabapentin (NEURONTIN) 300 MG capsule Take 1 capsule (300 mg total) by mouth 3 (three) times daily. 270 capsule 1   ipratropium-albuterol (DUONEB) 0.5-2.5 (3) MG/3ML SOLN Take 3 mLs by nebulization every 6 (six) hours as needed. 360 mL 2   omeprazole (PRILOSEC) 40 MG capsule Take 1 capsule by mouth once daily 90 capsule 0   Polyvinyl Alcohol-Povidone (REFRESH OP) Place 1 drop into both eyes daily as needed  (For dry eyes).     pravastatin (PRAVACHOL) 20 MG tablet Take 1 tablet by mouth once daily 90 tablet 0   Tiotropium Bromide Monohydrate 1.25 MCG/ACT AERS Inhale into the lungs.     traMADol (ULTRAM) 50 MG tablet Take 1 tablet (50 mg total) by mouth daily as needed. (Patient taking differently: Take 50 mg by mouth daily as needed for moderate pain.) 30 tablet 5   umeclidinium-vilanterol (ANORO ELLIPTA) 62.5-25 MCG/INH AEPB Inhale 1 puff into the lungs daily. 30 each 11   VITAMIN D PO Take 1 tablet by mouth daily.     albuterol (PROVENTIL) (2.5 MG/3ML) 0.083% nebulizer solution Take 3 mLs (2.5 mg total) by nebulization every 6 (six) hours as needed for wheezing or shortness of breath. (Patient not taking: Reported on 05/16/2022) 360 mL 5   predniSONE (DELTASONE) 10 MG tablet Take 4 tabs by mouth for 3 days, then 3 for 3 days, 2 for 3 days, 1 for 3 days and stop (Patient not taking: Reported on 06/28/2022) 30 tablet 0   Tiotropium Bromide-Olodaterol 2.5-2.5 MCG/ACT AERS Inhale 2 puffs into the lungs daily. (Patient not taking: Reported on 06/28/2022) 4 g 0   No facility-administered medications prior to visit.    Review of Systems  Constitutional:  Negative for chills, fever, malaise/fatigue and weight loss.  HENT:  Negative for hearing loss, sore throat and tinnitus.   Eyes:  Negative for blurred vision and double vision.  Respiratory:  Positive for shortness of breath. Negative for cough, hemoptysis, sputum production, wheezing and stridor.   Cardiovascular:  Positive for leg swelling. Negative for chest pain, palpitations, orthopnea and PND.  Gastrointestinal:  Negative for abdominal pain, constipation, diarrhea, heartburn, nausea and vomiting.  Genitourinary:  Negative for dysuria, hematuria and urgency.  Musculoskeletal:  Negative for joint pain and myalgias.  Skin:  Negative for itching and rash.  Neurological:  Negative for dizziness, tingling, weakness and headaches.  Endo/Heme/Allergies:   Negative for environmental allergies. Does not bruise/bleed easily.  Psychiatric/Behavioral:  Negative for depression. The patient is not nervous/anxious and does not have insomnia.   All other systems reviewed and are negative.    Objective:   Vitals:   06/28/22 1408  BP: 118/66  Pulse: 84  Temp: 97.6 F (36.4 C)  TempSrc: Oral  SpO2: 96%  Weight: 118 lb (53.5 kg)  Height: '4\' 11"'$  (1.499 m)   96%  BMI Readings from Last 3 Encounters:  06/28/22 23.83 kg/m  05/16/22 22.02 kg/m  02/26/22 22.02 kg/m   Wt Readings from Last 3 Encounters:  06/28/22 118 lb (53.5 kg)  02/26/22 109 lb (49.4 kg)  02/13/22 109 lb 6.4 oz (49.6 kg)   Physical Exam: General appearance: 81 y.o., female, NAD, conversant, elderly frail female Eyes: anicteric sclerae, moist conjunctivae; no lid-lag; PERRLA, tracking  appropriately HENT: NCAT; oropharynx, MMM, no mucosal ulcerations; normal hard and soft palate Neck: Trachea midline; FROM, supple, lymphadenopathy, no JVD Lungs: Diminished breath sounds bilaterally CV: RRR, S1, S2, no MRGs  Abdomen: Soft, non-tender; non-distended, BS present  Extremities: No peripheral edema, radial and DP pulses present bilaterally  Skin: Normal temperature, turgor and texture; no rash Psych: Appropriate affect Neuro: Alert and oriented to person and place, no focal deficit      Chest Imaging- films reviewed: CTA Chest 10/27/2019-emphysema, bilateral dependent pleural effusions.  No PE, no significant mediastinal or hilar adenopathy.  Hernia with stomach and abdominal fat contents protruding into the mediastinum. CXR 05/22/21 - COPD. No infiltrate effusion or edema CXR 02/26/22 - Resolution of lower lobe infiltrates  CXR 05/16/22 reviewed by me underpenetrated relative to prior but no pneumonia  Pulmonary Functions Testing Results:    Latest Ref Rng & Units 11/01/2020    3:05 PM 02/13/2016    1:04 PM  PFT Results  FVC-Pre L 1.82  1.82   FVC-Predicted Pre % 82  76    FVC-Post L 1.80  2.02   FVC-Predicted Post % 81  84   Pre FEV1/FVC % % 48  45   Post FEV1/FCV % % 46  47   FEV1-Pre L 0.87  0.82   FEV1-Predicted Pre % 52  46   FEV1-Post L 0.83  0.94   DLCO uncorrected ml/min/mmHg 9.89  9.75   DLCO UNC% % 60  51   DLCO corrected ml/min/mmHg 9.89  9.26   DLCO COR %Predicted % 60  49   DLVA Predicted % 80  64   TLC L 5.04    TLC % Predicted % 113    RV % Predicted % 159     2017- severe obstruction with out significant bronchodilator reversibility.  Moderate diffusion impairment.  Flow volume loop supports obstruction.  2019 spirometry: FVC 1.8 (75%) FEV1 0.7 (39%) Ratio 38%  PFT 11/01/20 FVC 1.8 (81%) FEV1 0.83 (50%) Ratio 48  TLC 113% RV 159% DLCO 60% Interpretation: Moderately severe obstructive defect with air trapping and reduced DLCO consistent with emphysema.     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 Last COPD hospitalization: 02/2022. Has had multiple outpatient exacerbations in the last year.  Severe COPD, emphysema, breathlessness -- She did not do well with the Stiolto. Put her back on Anoro. Start her on 5 mg prednisone daily. Briefly discussed some outpatient palliative care. Return to clinic to see Dr. Loanne Drilling in 4 weeks.  Other chronic issues not addressed today:  Allergic Rhinitis - improved --Claritin as needed  Health Maintenance Immunization History  Administered Date(s) Administered   Fluad Quad(high Dose 65+) 06/18/2019, 07/13/2020, 07/19/2021   Influenza, High Dose Seasonal PF 07/27/2013, 05/14/2016, 05/20/2017, 06/05/2018   Influenza,inj,Quad PF,6+ Mos 08/03/2014, 06/21/2015   Influenza,inj,quad, With Preservative 06/04/2019   Influenza-Unspecified 07/04/2012, 06/18/2022   PFIZER(Purple Top)SARS-COV-2 Vaccination 11/17/2019, 12/08/2019, 08/17/2020   Pneumococcal Conjugate-13 11/11/2015   Pneumococcal-Unspecified 09/03/2010   Td 09/03/2012   CT Lung  Screening - not a candidate. Quit smoking > 15 years ago  RTC December with Vivi Barrack, MD [facility] 06/28/2022 2:23 PM

## 2022-06-28 NOTE — Patient Instructions (Signed)
Thank you for visiting Dr. Valeta Harms at [facility]. Today we recommend the following:  Continue anoro  Meds ordered this encounter  Medications   predniSONE (DELTASONE) 5 MG tablet    Sig: Take 1 tablet (5 mg total) by mouth daily with breakfast.    Dispense:  30 tablet    Refill:  1   Return in about 4 weeks (around 07/26/2022) for next avaialble with Dr. Loanne Drilling .    Please do your part to reduce the spread of COVID-19.

## 2022-07-03 ENCOUNTER — Other Ambulatory Visit: Payer: Self-pay | Admitting: Internal Medicine

## 2022-07-06 DIAGNOSIS — J189 Pneumonia, unspecified organism: Secondary | ICD-10-CM | POA: Diagnosis not present

## 2022-07-06 DIAGNOSIS — J449 Chronic obstructive pulmonary disease, unspecified: Secondary | ICD-10-CM | POA: Diagnosis not present

## 2022-07-12 ENCOUNTER — Encounter: Payer: Self-pay | Admitting: Emergency Medicine

## 2022-07-12 ENCOUNTER — Ambulatory Visit (INDEPENDENT_AMBULATORY_CARE_PROVIDER_SITE_OTHER): Payer: Medicare HMO | Admitting: Emergency Medicine

## 2022-07-12 ENCOUNTER — Ambulatory Visit (INDEPENDENT_AMBULATORY_CARE_PROVIDER_SITE_OTHER): Payer: Medicare HMO

## 2022-07-12 VITALS — BP 140/82 | HR 94 | Temp 98.2°F | Ht 59.0 in

## 2022-07-12 DIAGNOSIS — R110 Nausea: Secondary | ICD-10-CM | POA: Diagnosis not present

## 2022-07-12 DIAGNOSIS — S4991XA Unspecified injury of right shoulder and upper arm, initial encounter: Secondary | ICD-10-CM

## 2022-07-12 DIAGNOSIS — M79601 Pain in right arm: Secondary | ICD-10-CM | POA: Diagnosis not present

## 2022-07-12 DIAGNOSIS — S0990XA Unspecified injury of head, initial encounter: Secondary | ICD-10-CM | POA: Insufficient documentation

## 2022-07-12 NOTE — Assessment & Plan Note (Signed)
No fracture on xray.

## 2022-07-12 NOTE — Assessment & Plan Note (Signed)
Clinically stable.  No red flag signs or symptoms. No neurological findings.  Baseline's mental status. ED precautions given. Head injury precautions given.

## 2022-07-12 NOTE — Assessment & Plan Note (Signed)
Could be secondary to pain or pain medication.  Not active at present time.

## 2022-07-12 NOTE — Patient Instructions (Signed)
Head Injury, Adult There are many types of head injuries. They can be as minor as a small bump. Some head injuries can be worse. Worse injuries include: A strong hit to the head that shakes the brain back and forth, causing damage (concussion). A bruise (contusion) of the brain. This means there is bleeding in the brain that can cause swelling. A cracked skull (skull fracture). Bleeding in the brain that gathers, gets thick (makes a clot), and forms a bump (hematoma). Most problems from a head injury come in the first 24 hours. However, you may still have side effects up to 7-10 days after your injury. It is important to watch your condition for any changes. You may need to be watched in the emergency department or urgent care, or you may need to stay in the hospital. What are the causes? There are many possible causes of a head injury. A serious head injury may be caused by: A car accident. Bicycle or motorcycle accidents. Sports injuries. Falls. Being hit by an object. What are the signs or symptoms? Symptoms of a head injury include a bruise, bump, or bleeding where the injury happened. Other physical symptoms may include: Headache. Feeling like you may vomit (nauseous) or vomiting. Dizziness. Blurred or double vision. Being uncomfortable around bright lights or loud noises. Shaking movements that you cannot control (seizures). Feeling tired. Trouble being woken up. Fainting or loss of consciousness. Mental or emotional symptoms may include: Feeling grumpy or cranky. Confusion and memory problems. Having trouble paying attention or concentrating. Changes in eating or sleeping habits. Feeling worried or nervous (anxious). Feeling sad (depressed). How is this treated? Treatment for this condition depends on how severe the injury is and the type of injury you have. The main goal is to prevent problems and to allow the brain time to heal. Mild head injury If you have a mild head  injury, you may be sent home, and treatment may include: Being watched. A responsible adult should stay with you for 24 hours after your injury and check on you often. Physical rest. Brain rest. Pain medicines. Severe head injury If you have a severe head injury, treatment may include: Being watched closely. This includes staying in the hospital. Medicines to: Help with pain. Prevent seizures. Help with brain swelling. Protecting your airway and using a machine that helps you breathe (ventilator). Treatments to watch for and manage swelling inside the brain. Brain surgery. This may be needed to: Remove a collection of blood or blood clots. Stop the bleeding. Remove a part of the skull. This allows room for the brain to swell. Follow these instructions at home: Activity Rest. Avoid activities that are hard or tiring. Make sure you get enough sleep. Let your brain rest. Do this by limiting activities that need a lot of thought or attention, such as: Watching TV. Playing memory games and puzzles. Job-related work or homework. Working on [employer], [employer], and texting. Avoid activities that could cause another head injury until your doctor says it is okay. This includes playing sports. Having another head injury, especially before the first one has healed, can be dangerous. Ask your doctor when it is safe for you to go back to your normal activities, such as work or school. Ask your doctor for a step-by-step plan for slowly going back to your normal activities. Ask your doctor when you can drive, ride a bicycle, or use heavy machinery. Do not do these activities if you are dizzy. Lifestyle  Do  not drink alcohol until your doctor says it is okay. Do not use drugs. If it is harder than usual to remember things, write them down. If you are easily distracted, try to do one thing at a time. Talk with family members or close friends when making important decisions. Tell your  friends, family, a trusted co-worker, and work [employer] about your injury, symptoms, and limits (restrictions). Have them watch for any problems that are new or getting worse. General instructions Take over-the-counter and prescription medicines only as told by your doctor. Have someone stay with you for 24 hours after your head injury. This person should watch you for any changes in your symptoms and be ready to get help. Keep all follow-up visits as told by your doctor. This is important. How is this prevented? Work on [employer]. This can help you avoid falls. Wear a seat belt when you are in a moving vehicle. Wear a helmet when you: Ride a bicycle. Ski. Do any other sport or activity that has a risk of injury. If you drink alcohol: Limit how much you use to: 0-1 drink a day for nonpregnant women. 0-2 drinks a day for men. Be aware of how much alcohol is in your drink. In the [place] one drink equals one 12 oz bottle of beer (355 mL), one 5 oz glass of wine (148 mL), or one 1 oz glass of hard liquor (44 mL). Make your home safer by: Getting rid of clutter from the floors and stairs. This includes things that can make you trip. Using grab bars in bathrooms and handrails by stairs. Placing non-slip mats on floors and in bathtubs. Putting more light in dim areas. Where to find more information Centers for Disease Control and Prevention: http://www.wolf.info/ Get help right away if: You have: A very bad headache that is not helped by medicine. Trouble walking or weakness in your arms and legs. Clear or bloody fluid coming from your nose or ears. Changes in how you see (vision). A seizure. More confusion or more grumpy moods. Your symptoms get worse. You are sleepier than normal and have trouble staying awake. You lose your balance. The black centers of your eyes (pupils) change in size. Your speech is slurred. Your dizziness gets worse. You vomit. These symptoms may be an  emergency. Do not wait to see if the symptoms will go away. Get medical help right away. Call your local emergency services (911 in the [place] Do not drive yourself to the hospital. Summary Head injuries can be as minor as a small bump. Some head injuries can be worse. Treatment for this condition depends on how severe the injury is and the type of injury you have. Have someone stay with you for 24 hours after your head injury. Ask your doctor when it is safe for you to go back to your normal activities, such as work or school. To prevent a head injury, wear a seat belt in a car, wear a helmet when you use a bicycle, limit your alcohol use, and make your home safer. This information is not intended to replace advice given to you by your health care provider. Make sure you discuss any questions you have with your health care provider. Document Revised: 07/03/2019 Document Reviewed: 07/03/2019 Elsevier Patient Education  [place].
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
## 2022-07-13 ENCOUNTER — Telehealth: Payer: Self-pay | Admitting: Emergency Medicine

## 2022-07-13 MED ORDER — CYCLOBENZAPRINE HCL 5 MG PO TABS: 5.0000 mg | ORAL_TABLET | Freq: Two times a day (BID) | ORAL | 0 refills | Status: DC | PRN

## 2022-07-13 NOTE — Telephone Encounter (Signed)
Called patient and informed her that a rx for a muscle relaxer was sent in to her requested pharmacy

## 2022-07-13 NOTE — Telephone Encounter (Signed)
Patient was seen yesterday and was told that they were going to call in something stronger for patients pain.  Patient has checked with pharmacy and nothing was ever sent in.  Please advise.

## 2022-07-13 NOTE — Telephone Encounter (Signed)
I don't see any reference to this in his note. Did he tell them what he was going to send in? I could send in a muscle relaxer for her if they want and it is safe for her to take tylenol 1000 mg 3 times a day.

## 2022-07-13 NOTE — Telephone Encounter (Signed)
Sent in for her

## 2022-07-13 NOTE — Telephone Encounter (Signed)
I am unsure what he was going to send in for her.  Patient wants to try the muscle relaxer. She takes Tylenol now for pain

## 2022-07-15 ENCOUNTER — Emergency Department (HOSPITAL_COMMUNITY): Payer: Medicare HMO

## 2022-07-15 ENCOUNTER — Other Ambulatory Visit: Payer: Self-pay

## 2022-07-15 ENCOUNTER — Other Ambulatory Visit: Payer: Self-pay | Admitting: Internal Medicine

## 2022-07-15 ENCOUNTER — Other Ambulatory Visit (HOSPITAL_COMMUNITY): Payer: Medicare HMO

## 2022-07-15 ENCOUNTER — Emergency Department (HOSPITAL_COMMUNITY)
Admission: EM | Admit: 2022-07-15 | Discharge: 2022-07-15 | Disposition: A | Discharge status: Home / Self Care | Payer: Medicare HMO | Attending: Emergency Medicine | Admitting: Emergency Medicine

## 2022-07-15 DIAGNOSIS — J181 Lobar pneumonia, unspecified organism: Secondary | ICD-10-CM | POA: Insufficient documentation

## 2022-07-15 DIAGNOSIS — R519 Headache, unspecified: Secondary | ICD-10-CM | POA: Insufficient documentation

## 2022-07-15 DIAGNOSIS — Y92480 Sidewalk as the place of occurrence of the external cause: Secondary | ICD-10-CM | POA: Insufficient documentation

## 2022-07-15 DIAGNOSIS — Z7982 Long term (current) use of aspirin: Secondary | ICD-10-CM | POA: Insufficient documentation

## 2022-07-15 DIAGNOSIS — S299XXA Unspecified injury of thorax, initial encounter: Secondary | ICD-10-CM | POA: Diagnosis present

## 2022-07-15 DIAGNOSIS — M25511 Pain in right shoulder: Secondary | ICD-10-CM | POA: Diagnosis not present

## 2022-07-15 DIAGNOSIS — W19XXXA Unspecified fall, initial encounter: Secondary | ICD-10-CM

## 2022-07-15 DIAGNOSIS — J189 Pneumonia, unspecified organism: Secondary | ICD-10-CM

## 2022-07-15 DIAGNOSIS — Y9355 Activity, bike riding: Secondary | ICD-10-CM | POA: Diagnosis not present

## 2022-07-15 DIAGNOSIS — S0990XA Unspecified injury of head, initial encounter: Secondary | ICD-10-CM | POA: Diagnosis not present

## 2022-07-15 DIAGNOSIS — Z743 Need for continuous supervision: Secondary | ICD-10-CM | POA: Diagnosis not present

## 2022-07-15 DIAGNOSIS — Z7951 Long term (current) use of inhaled steroids: Secondary | ICD-10-CM | POA: Diagnosis not present

## 2022-07-15 DIAGNOSIS — S199XXA Unspecified injury of neck, initial encounter: Secondary | ICD-10-CM | POA: Diagnosis not present

## 2022-07-15 DIAGNOSIS — R918 Other nonspecific abnormal finding of lung field: Secondary | ICD-10-CM | POA: Diagnosis not present

## 2022-07-15 DIAGNOSIS — G8911 Acute pain due to trauma: Secondary | ICD-10-CM | POA: Diagnosis not present

## 2022-07-15 DIAGNOSIS — S2241XA Multiple fractures of ribs, right side, initial encounter for closed fracture: Secondary | ICD-10-CM | POA: Diagnosis not present

## 2022-07-15 DIAGNOSIS — M25561 Pain in right knee: Secondary | ICD-10-CM | POA: Insufficient documentation

## 2022-07-15 DIAGNOSIS — S40012A Contusion of left shoulder, initial encounter: Secondary | ICD-10-CM | POA: Diagnosis not present

## 2022-07-15 DIAGNOSIS — J449 Chronic obstructive pulmonary disease, unspecified: Secondary | ICD-10-CM | POA: Diagnosis not present

## 2022-07-15 DIAGNOSIS — K449 Diaphragmatic hernia without obstruction or gangrene: Secondary | ICD-10-CM | POA: Diagnosis not present

## 2022-07-15 DIAGNOSIS — R531 Weakness: Secondary | ICD-10-CM | POA: Diagnosis not present

## 2022-07-15 DIAGNOSIS — I700 Atherosclerosis of aorta: Secondary | ICD-10-CM | POA: Diagnosis not present

## 2022-07-15 MED ORDER — AMOXICILLIN-POT CLAVULANATE 875-125 MG PO TABS: 1.0000 | ORAL_TABLET | Freq: Two times a day (BID) | ORAL | 0 refills | Status: DC

## 2022-07-15 MED ORDER — DOXYCYCLINE HYCLATE 100 MG PO TABS
100.0000 mg | ORAL_TABLET | Freq: Once | ORAL | Status: AC
  Administered 2022-07-15: 100 mg via ORAL
  Filled 2022-07-15: qty 1

## 2022-07-15 MED ORDER — DOXYCYCLINE HYCLATE 100 MG PO CAPS: 100.0000 mg | ORAL_CAPSULE | Freq: Two times a day (BID) | ORAL | 0 refills | Status: DC

## 2022-07-15 MED ORDER — HYDROCODONE-ACETAMINOPHEN 5-325 MG PO TABS
1.0000 | ORAL_TABLET | Freq: Once | ORAL | Status: AC
  Administered 2022-07-15: 1 via ORAL
  Filled 2022-07-15: qty 1

## 2022-07-15 MED ORDER — HYDROCODONE-ACETAMINOPHEN 5-325 MG PO TABS: 1.0000 | ORAL_TABLET | ORAL | 0 refills | Status: DC | PRN

## 2022-07-15 MED ORDER — AMOXICILLIN-POT CLAVULANATE 875-125 MG PO TABS
1.0000 | ORAL_TABLET | Freq: Once | ORAL | Status: AC
  Administered 2022-07-15: 1 via ORAL
  Filled 2022-07-15: qty 1

## 2022-07-15 NOTE — Discharge Instructions (Addendum)
Take the antibiotics as discussed.  You can use the hydrocodone as needed for pain.  Do not take Tylenol within 4 hours of taking the hydrocodone.  Make sure that you take a stool softener along with this.  Using incentive spirometer several times a day.  Make an appointment to follow-up with your pulmonologist within the next couple of days for recheck.  Return to emergency room if you have any worsening symptoms.
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
## 2022-07-15 NOTE — ED Triage Notes (Signed)
Patient bib gems,   Patient fell about one week ago , last Monday [address] on right side History of right side weakness r/t previous CVA   Pain reported in whole right side No deformity or discoloration noted.  No LOC No anticoags

## 2022-07-19 ENCOUNTER — Encounter: Payer: Self-pay | Admitting: Adult Health

## 2022-07-19 ENCOUNTER — Ambulatory Visit: Payer: Medicare HMO

## 2022-07-19 ENCOUNTER — Ambulatory Visit: Payer: Medicare HMO | Admitting: Adult Health

## 2022-07-19 ENCOUNTER — Telehealth: Payer: Self-pay

## 2022-07-19 ENCOUNTER — Telehealth: Payer: Self-pay | Admitting: Internal Medicine

## 2022-07-19 VITALS — BP 112/60 | HR 93 | Temp 98.2°F | Ht 59.0 in | Wt 115.0 lb

## 2022-07-19 DIAGNOSIS — J189 Pneumonia, unspecified organism: Secondary | ICD-10-CM | POA: Diagnosis not present

## 2022-07-19 DIAGNOSIS — J449 Chronic obstructive pulmonary disease, unspecified: Secondary | ICD-10-CM | POA: Diagnosis not present

## 2022-07-19 DIAGNOSIS — J9611 Chronic respiratory failure with hypoxia: Secondary | ICD-10-CM

## 2022-07-19 NOTE — Progress Notes (Signed)
$'@Patient'p$  ID: Sherry Lambert, female    DOB: 10-10-1940, 81 y.o.   MRN: [id number]  Chief Complaint  Patient presents with   Follow-up    Referring provider: [name], *  HPI: 81 year old female former smoker followed for COPD and chronic respiratory failure on oxygen  TEST/EVENTS :  PFT (02/13/16)  FEV1/FVC 45%, FEV1  0.82  46%. No hyperinflation. DLCO  51% Postbronchodilator FEV1 53%, ratio 47, FVC 84, positive bronchodilator response. Spirometry 06/2017 -stable moderate COPD with FEV1 at 59%, ratio 44, FVC 101%.    CTA Chest 10/27/2019-emphysema, bilateral dependent pleural effusions.  No PE, no significant mediastinal or hilar adenopathy.  Hernia with stomach and abdominal fat contents protruding into the mediastinum.  07/19/2022 Follow up: COPD, Pneumonia  Patient presents for an emergency room follow-up.  Patient had a fall a little over a week ago while shopping at [place] over the electric scooter over a curb.   She was seen in the emergency room.  X-rays revealed a left basilar airspace consolidation concerning for pneumonia.  She was given Augmentin and doxycycline for 7 days.  Patient has been seen in the office 3 weeks ago with a COPD flare.  Was given a prednisone taper.  Patient says she is tolerating antibiotics well. No hemoptysis, fever, chest pain, nausea vomiting or diarrhea.  Appetite is good.  Has previously tried Trelegy inhaler in the past but was unable to tolerate with frequent thrush.  Remains on Anoro daily.  Uses DuoNeb 3 times daily.  Is on Oxygen 1.5 L with activity and at bedtime.  She was started on prednisone 5 mg daily last visit.  She does feel like her breathing is doing better.    Allergies  Allergen Reactions   Lipitor [Atorvastatin] Itching    Immunization History  Administered Date(s) Administered   Fluad Quad(high Dose 65+) 06/18/2019, 07/13/2020, 07/19/2021, 07/02/2022   Influenza, High Dose Seasonal PF 07/27/2013,  05/14/2016, 05/20/2017, 06/05/2018   Influenza,inj,Quad PF,6+ Mos 08/03/2014, 06/21/2015   Influenza,inj,quad, With Preservative 06/04/2019   Influenza-Unspecified 07/04/2012, 06/18/2022   PFIZER(Purple Top)SARS-COV-2 Vaccination 11/17/2019, 12/08/2019, 08/17/2020   Pneumococcal Conjugate-13 11/11/2015   Pneumococcal-Unspecified 09/03/2010   Td 09/03/2012    Past Medical History:  Diagnosis Date   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)    November 2016- Thanksgiving night    Tobacco History: Social History   Tobacco Use  Smoking Status Former   Packs/day: 1.00   Years: 55.00   Total pack years: 55.00   Types: Cigarettes   Quit date: 10/19/2002   Years since quitting: 19.7  Smokeless Tobacco Never   Counseling given: Not Answered   Outpatient Medications Prior to Visit  Medication Sig Dispense Refill   acetaminophen (TYLENOL) 500 MG tablet Take 500 mg by mouth daily.      albuterol (PROVENTIL) (2.5 MG/3ML) 0.083% nebulizer solution Take 3 mLs (2.5 mg total) by nebulization every 6 (six) hours as needed for wheezing or shortness of breath. 360 mL 5  albuterol (VENTOLIN HFA) 108 (90 Base) MCG/ACT inhaler Inhale 1-2 puffs into the lungs every 4 (four) hours as needed for wheezing or shortness of breath. 18 g 6   amoxicillin-clavulanate (AUGMENTIN) 875-125 MG tablet Take 1 tablet by mouth every 12 (twelve) hours. 14 tablet 0   aspirin EC 81 MG tablet Take 1 tablet (81 mg total) by mouth daily. 90 tablet 3   cyclobenzaprine (FLEXERIL) 5 MG tablet Take 1 tablet (5 mg total) by mouth 2 (two) times daily as needed  for muscle spasms. 60 tablet 0   doxycycline (VIBRAMYCIN) 100 MG capsule Take 1 capsule (100 mg total) by mouth 2 (two) times daily. One po bid x 7 days 10 capsule 0   gabapentin (NEURONTIN) 300 MG capsule Take 1 capsule (300 mg total) by mouth 3 (three) times daily. 270 capsule 1   HYDROcodone-acetaminophen (NORCO/VICODIN) 5-325 MG tablet Take 1 tablet by mouth every 4 (four) hours as needed. 12 tablet 0   ipratropium-albuterol (DUONEB) 0.5-2.5 (3) MG/3ML SOLN Take 3 mLs by nebulization every 6 (six) hours as needed. 360 mL 2   omeprazole (PRILOSEC) 40 MG capsule Take 1 capsule by mouth once daily 90 capsule 0   Polyvinyl Alcohol-Povidone (REFRESH OP) Place 1 drop into both eyes daily as needed (For dry eyes).     pravastatin (PRAVACHOL) 20 MG tablet Take 1 tablet by mouth once daily 90 tablet 0   predniSONE (DELTASONE) 5 MG tablet Take 1 tablet (5 mg total) by mouth daily with breakfast. 30 tablet 1   traMADol (ULTRAM) 50 MG tablet TAKE 1 TABLET BY MOUTH ONCE DAILY AS NEEDED 30 tablet 5   umeclidinium-vilanterol (ANORO ELLIPTA) 62.5-25 MCG/INH AEPB Inhale 1 puff into the lungs daily. 30 each 11   VITAMIN D PO Take 1 tablet by mouth daily.     Tiotropium Bromide Monohydrate 1.25 MCG/ACT AERS Inhale into the lungs. (Patient not taking: Reported on 07/19/2022)     No facility-administered medications prior to visit.     Review of Systems:   Constitutional:   No  weight loss, night sweats,  Fevers, chills,  +fatigue, or  lassitude.  HEENT:   No headaches,  Difficulty swallowing,  Tooth/dental problems, or  Sore throat,                No sneezing, itching, ear ache, nasal congestion, post nasal drip,   CV:  No chest pain,  Orthopnea, PND, swelling in lower extremities, anasarca, dizziness, palpitations, syncope.   GI  No heartburn, indigestion, abdominal pain, nausea, vomiting, diarrhea, change in bowel habits, loss of appetite, bloody stools.   Resp:   No chest wall deformity  Skin: no  rash or lesions.  GU: no dysuria, change in color of urine, no urgency or frequency.  No flank pain, no hematuria   MS:  No joint pain or swelling.  No decreased range of motion.  No back pain.    Physical Exam  BP 112/60 (BP Location: Left Arm, Patient Position: Sitting, Cuff Size: Normal)   Pulse 93   Temp 98.2 F (36.8 C) (Oral)   Ht '4\' 11"'$  (1.499 m)   Wt 115 lb (52.2 kg)   SpO2 95%   BMI 23.23 kg/m   GEN: A/Ox3; pleasant , NAD, well nourished , wc    HEENT:  [place]/AT,   NOSE-clear, THROAT-clear, no lesions, no postnasal drip or exudate noted.   NECK:  Supple w/ fair ROM; no JVD; normal carotid impulses w/o bruits; no thyromegaly  or nodules palpated; no lymphadenopathy.    RESP  Clear  P & A; w/o, wheezes/ rales/ or rhonchi. no accessory muscle use, no dullness to percussion  CARD:  RRR, no m/r/g, no peripheral edema, pulses intact, no cyanosis or clubbing.  GI:   Soft & nt; nml bowel sounds; no organomegaly or masses detected.   Musco: Warm bil, no deformities or joint swelling noted.   Neuro: alert, no focal deficits noted.    Skin: Warm, no lesions or rashes    Lab Results:  CBC  BMET  ProBNP No results found for: "PROBNP"  Imaging: DG Knee Complete 4 Views Right  Result Date: 07/15/2022 CLINICAL DATA:  Fall, knee pain EXAM: RIGHT KNEE - COMPLETE 4+ VIEW COMPARISON:  None Available. FINDINGS: No fracture or dislocation of the right knee. 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 No knee joint effusion. Vascular calcinosis. IMPRESSION: 1. No fracture or dislocation of the right knee. 2. 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 Electronically Signed   By: Delanna Ahmadi M.D.   On: 07/15/2022 14:20   DG Ribs Unilateral W/Chest Right  Result Date: 07/15/2022 CLINICAL DATA:  81 year old female with history of chest pain  after falling. EXAM: RIGHT RIBS AND CHEST - 3+ VIEW COMPARISON:  Chest x-ray 05/16/2022. FINDINGS: Patchy areas of interstitial prominence an ill-defined opacities in the left lung base concerning for pneumonia. Right lung is clear. No pleural effusions. No pneumothorax. No evidence of pulmonary edema. Heart size is normal. Moderate to large hiatal hernia. Upper mediastinal contours are within normal limits. Atherosclerotic calcifications are noted in the thoracic aorta. Dedicated views of the right ribs demonstrate subtle minimally displaced fractures of the lateral right second and third ribs. IMPRESSION: 1. Acute minimally displaced fractures of the lateral right second and third ribs. No pneumothorax. 2. Ill-defined airspace consolidation in the left lung base concerning for developing bronchopneumonia. Followup PA and lateral chest X-ray is recommended in 3-4 weeks following trial of antibiotic therapy to ensure resolution and exclude underlying malignancy. 3. Aortic atherosclerosis. Electronically Signed   By: Vinnie Langton M.D.   On: 07/15/2022 12:21   DG Shoulder Right  Result Date: 07/15/2022 CLINICAL DATA:  81 year old female with history of right shoulder pain. EXAM: RIGHT SHOULDER - 2+ VIEW COMPARISON:  No priors. FINDINGS: Three views of the right shoulder demonstrate no acute displaced fracture, subluxation or dislocation. However, there are mildly displaced fractures of the lateral right second and third ribs. IMPRESSION: 1. No acute radiographic abnormality of the right shoulder. 2. Acute minimally displaced fractures of the lateral right second and third ribs. Electronically Signed   By: Vinnie Langton M.D.   On: 07/15/2022 12:18   CT Cervical Spine Wo Contrast  Result Date: 07/15/2022 CLINICAL DATA:  Head trauma.  Fall. EXAM: CT HEAD WITHOUT CONTRAST CT CERVICAL SPINE WITHOUT CONTRAST TECHNIQUE: Multidetector CT imaging of the head and cervical spine was performed following the  standard protocol without intravenous contrast. Multiplanar CT image reconstructions of the cervical spine were also generated. RADIATION DOSE REDUCTION: This exam was performed according to the departmental dose-optimization program which includes automated exposure control, adjustment of the mA and/or kV according to patient size and/or use of iterative reconstruction technique. COMPARISON:  10/02/2014. FINDINGS: CT HEAD FINDINGS Brain: No evidence of acute infarction, hemorrhage, hydrocephalus, extra-axial collection or mass lesion/mass effect. Vascular: No hyperdense vessel or unexpected calcification. Skull: Normal. Negative for fracture or  focal lesion. Sinuses/Orbits: Globes and orbits are unremarkable. Sinuses are clear. Other: None. CT CERVICAL SPINE FINDINGS Alignment: Normal. Skull base and vertebrae: No acute fracture. No primary bone lesion or focal pathologic process. Soft tissues and spinal canal: No prevertebral fluid or swelling. No visible canal hematoma. Disc levels: Moderate loss of disc height at C3-C4, C5-C6 and mild loss of disc height at C6-C7. Mild disc bulging with endplate spurring at these levels. No convincing disc herniation. Mild facet degenerative changes. Upper chest: No acute or significant abnormalities. Other: None. IMPRESSION: HEAD CT 1. No acute intracranial abnormalities. CERVICAL CT 1. No fracture or acute finding. Electronically Signed   By: Lajean Manes M.D.   On: 07/15/2022 12:03   CT Head Wo Contrast  Result Date: 07/15/2022 CLINICAL DATA:  Head trauma.  Fall. EXAM: CT HEAD WITHOUT CONTRAST CT CERVICAL SPINE WITHOUT CONTRAST TECHNIQUE: Multidetector CT imaging of the head and cervical spine was performed following the standard protocol without intravenous contrast. Multiplanar CT image reconstructions of the cervical spine were also generated. RADIATION DOSE REDUCTION: This exam was performed according to the departmental dose-optimization program which includes  automated exposure control, adjustment of the mA and/or kV according to patient size and/or use of iterative reconstruction technique. COMPARISON:  10/02/2014. FINDINGS: CT HEAD FINDINGS Brain: No evidence of acute infarction, hemorrhage, hydrocephalus, extra-axial collection or mass lesion/mass effect. Vascular: No hyperdense vessel or unexpected calcification. Skull: Normal. Negative for fracture or focal lesion. Sinuses/Orbits: Globes and orbits are unremarkable. Sinuses are clear. Other: None. CT CERVICAL SPINE FINDINGS Alignment: Normal. Skull base and vertebrae: No acute fracture. No primary bone lesion or focal pathologic process. Soft tissues and spinal canal: No prevertebral fluid or swelling. No visible canal hematoma. Disc levels: Moderate loss of disc height at C3-C4, C5-C6 and mild loss of disc height at C6-C7. Mild disc bulging with endplate spurring at these levels. No convincing disc herniation. Mild facet degenerative changes. Upper chest: No acute or significant abnormalities. Other: None. IMPRESSION: HEAD CT 1. No acute intracranial abnormalities. CERVICAL CT 1. No fracture or acute finding. Electronically Signed   By: Lajean Manes M.D.   On: 07/15/2022 12:03   DG Humerus Right  Result Date: 07/12/2022 CLINICAL DATA:  Fall with upper right arm pain. EXAM: RIGHT HUMERUS - 2+ VIEW COMPARISON:  None Available. FINDINGS: Bones are diffusely demineralized. No acute bony abnormality in the proximal or mid humerus. Distal humerus and elbow not fully evaluated on this study. No worrisome lytic or sclerotic osseous abnormality. IMPRESSION: No acute bony findings. Distal humerus and elbow not fully evaluated on this study. Electronically Signed   By: Misty Stanley M.D.   On: 07/12/2022 10:57         Latest Ref Rng & Units 11/01/2020    3:05 PM 02/13/2016    1:04 PM  PFT Results  FVC-Pre L 1.82  1.82   FVC-Predicted Pre % 82  76   FVC-Post L 1.80  2.02   FVC-Predicted Post % 81  84   Pre  FEV1/FVC % % 48  45   Post FEV1/FCV % % 46  47   FEV1-Pre L 0.87  0.82   FEV1-Predicted Pre % 52  46   FEV1-Post L 0.83  0.94   DLCO uncorrected ml/min/mmHg 9.89  9.75   DLCO UNC% % 60  51   DLCO corrected ml/min/mmHg 9.89  9.26   DLCO COR %Predicted % 60  49   DLVA Predicted % 80  64  TLC L 5.04    TLC % Predicted % 113    RV % Predicted % 159      No results found for: "NITRICOXIDE"      Assessment & Plan:   COPD, severe (HCC) Recent COPD exacerbation with superimposed pneumonia slowly improving.  Patient is to finish up antibiotics.  Continue on low-dose prednisone for now.  Continue on Anoro.  Along with DuoNeb 3 times daily.(Consider changing to albuterol return visit) (previously unable to tolerate Trelegy) RSV vaccine iin few weeks   Plan  Patient Instructions  Finish antibiotics with Augmentin and doxycycline as prescribed Continue on ANORO 1 puff daily  Use Duoneb daily for 3 days and then As needed   Continue on Oxygen 1.5l/m with activity and At bedtime  Mucinex DM Twice daily  As needed  Cough/congestion  Continue on Prednisone '5mg'$  daily.  Follow up with Dr. Loanne Drilling or Niemah Schwebke NP in 3-4 weeks with chest xray as planned and As needed   Please contact office for sooner follow up if symptoms do not improve or worsen or seek emergency care     Pneumonia Chest x-ray with left basilar pneumonia-patient is to complete antibiotic course as directed from emergency room.  She has 3 days of Augmentin and doxycycline to complete.  Seems to be tolerating well.  We will repeat chest x-ray in 3 to 4 weeks.  Chronic respiratory failure with hypoxia (HCC) Continue on oxygen 1.5 L/min with activity and at bedtime     Rexene Edison, NP 07/19/2022

## 2022-07-19 NOTE — Telephone Encounter (Signed)
Called patient and left a voicemail stating to give [place] a call back as soon as possible.

## 2022-07-19 NOTE — Assessment & Plan Note (Signed)
Recent COPD exacerbation with superimposed pneumonia slowly improving.  Patient is to finish up antibiotics.  Continue on low-dose prednisone for now.  Continue on Anoro.  Along with DuoNeb 3 times daily.(Consider changing to albuterol return visit) (previously unable to tolerate Trelegy) RSV vaccine iin few weeks   Plan  Patient Instructions  Finish antibiotics with Augmentin and doxycycline as prescribed Continue on ANORO 1 puff daily  Use Duoneb daily for 3 days and then As needed   Continue on Oxygen 1.5l/m with activity and At bedtime  Mucinex DM Twice daily  As needed  Cough/congestion  Continue on Prednisone '5mg'$  daily.  Follow up with Dr. Loanne Drilling or Truth Wolaver NP in 3-4 weeks with chest xray as planned and As needed   Please contact office for sooner follow up if symptoms do not improve or worsen or seek emergency care

## 2022-07-19 NOTE — Telephone Encounter (Signed)
Please call Sherry Lambert - Patient was seen by Dr. Mitchel Honour last week after a fall and was told that patient didn't have any broken bones.  Patient had to go back to [facility] and it was determined that she had 2 broken ribs.  Patient was given pain medication by physician at ER but is out of medication now.  Patient's friend would like you to call her back to determine what patient can take or can be given for pain.  Please call Sherry Lambert back.

## 2022-07-19 NOTE — Addendum Note (Signed)
Addended by: Vanessa Barbara on: 07/19/2022 04:22 PM   Modules accepted: Orders

## 2022-07-19 NOTE — Telephone Encounter (Signed)
     Patient  visit on 11/12  at [facility]   Have you been able to follow up with your primary care physician? Yes   The patient was or was not able to obtain any needed medicine or equipment. Yes   Are there diet recommendations that you are having difficulty following? Na   Patient expresses understanding of discharge instructions and education provided has no other needs at this time.  Yes     [place], [facility], Care Management  [phone] [address], [place], [place] [address] Phone: [phone] Email: [email or web address]

## 2022-07-19 NOTE — Patient Instructions (Addendum)
Finish antibiotics with Augmentin and doxycycline as prescribed Continue on ANORO 1 puff daily  Use Duoneb daily for 3 days and then As needed   Continue on Oxygen 1.5l/m with activity and At bedtime  Mucinex DM Twice daily  As needed  Cough/congestion  Continue on Prednisone '5mg'$  daily.  Follow up with Dr. Loanne Drilling or Cayenne Breault NP in 3-4 weeks with chest xray as planned and As needed   Please contact office for sooner follow up if symptoms do not improve or worsen or seek emergency care

## 2022-07-19 NOTE — Telephone Encounter (Signed)
I can't prescribe pain medication beyond what is in her pain contract without seeing her. This is our policy. If Sagardia wants to refill medication that would be his decision since he saw her.

## 2022-07-19 NOTE — Assessment & Plan Note (Signed)
Continue on oxygen 1.5 L/min with activity and at bedtime

## 2022-07-19 NOTE — Assessment & Plan Note (Signed)
Chest x-ray with left basilar pneumonia-patient is to complete antibiotic course as directed from emergency room.  She has 3 days of Augmentin and doxycycline to complete.  Seems to be tolerating well.  We will repeat chest x-ray in 3 to 4 weeks.

## 2022-07-22 ENCOUNTER — Other Ambulatory Visit: Payer: Self-pay | Admitting: Pulmonary Disease

## 2022-07-23 NOTE — Telephone Encounter (Signed)
Spoke with patients daughter and she stated that her mom is going to wait it out to be seen since they have an appointment on the 28 th. Pt daughter states that they did want not want to be seen by other provides except for her providers.

## 2022-07-23 NOTE — Telephone Encounter (Signed)
Daughter in law, Lilliona Blakeney (on HIPAA), says nobody returned her call for pain meds refill. 2 days after pt visit here, pt went by ambulance to [facility] where they found the broken ribs.  Can't our doctors honor the prescription by the provider at [facility]? Maudie Mercury [phone]

## 2022-07-24 ENCOUNTER — Ambulatory Visit: Payer: Medicare HMO | Admitting: Internal Medicine

## 2022-07-28 DIAGNOSIS — J189 Pneumonia, unspecified organism: Secondary | ICD-10-CM | POA: Diagnosis not present

## 2022-07-28 DIAGNOSIS — J449 Chronic obstructive pulmonary disease, unspecified: Secondary | ICD-10-CM | POA: Diagnosis not present

## 2022-07-30 ENCOUNTER — Ambulatory Visit (HOSPITAL_BASED_OUTPATIENT_CLINIC_OR_DEPARTMENT_OTHER): Payer: Medicare HMO | Admitting: Pulmonary Disease

## 2022-07-31 ENCOUNTER — Ambulatory Visit (INDEPENDENT_AMBULATORY_CARE_PROVIDER_SITE_OTHER): Payer: Medicare HMO | Admitting: Internal Medicine

## 2022-07-31 ENCOUNTER — Encounter: Payer: Self-pay | Admitting: Internal Medicine

## 2022-07-31 VITALS — BP 112/60 | HR 87 | Temp 98.5°F

## 2022-07-31 DIAGNOSIS — I739 Peripheral vascular disease, unspecified: Secondary | ICD-10-CM | POA: Diagnosis not present

## 2022-07-31 DIAGNOSIS — I69398 Other sequelae of cerebral infarction: Secondary | ICD-10-CM | POA: Diagnosis not present

## 2022-07-31 DIAGNOSIS — E1169 Type 2 diabetes mellitus with other specified complication: Secondary | ICD-10-CM | POA: Diagnosis not present

## 2022-07-31 DIAGNOSIS — E118 Type 2 diabetes mellitus with unspecified complications: Secondary | ICD-10-CM

## 2022-07-31 DIAGNOSIS — E785 Hyperlipidemia, unspecified: Secondary | ICD-10-CM

## 2022-07-31 DIAGNOSIS — R209 Unspecified disturbances of skin sensation: Secondary | ICD-10-CM

## 2022-07-31 DIAGNOSIS — R252 Cramp and spasm: Secondary | ICD-10-CM

## 2022-07-31 LAB — COMPREHENSIVE METABOLIC PANEL
ALT: 13 U/L (ref 0–35)
AST: 17 U/L (ref 0–37)
Albumin: 4 g/dL (ref 3.5–5.2)
Alkaline Phosphatase: 98 U/L (ref 39–117)
BUN: 14 mg/dL (ref 6–23)
CO2: 30 mEq/L (ref 19–32)
Calcium: 9.9 mg/dL (ref 8.4–10.5)
Chloride: 104 mEq/L (ref 96–112)
Creatinine, Ser: 0.83 mg/dL (ref 0.40–1.20)
GFR: 66.71 mL/min (ref >60.00)
Glucose, Bld: 103 mg/dL — ABNORMAL HIGH (ref 70–99)
Potassium: 4.6 mEq/L (ref 3.5–5.1)
Sodium: 142 mEq/L (ref 135–145)
Total Bilirubin: 0.4 mg/dL (ref 0.2–1.2)
Total Protein: 6.9 g/dL (ref 6.0–8.3)

## 2022-07-31 LAB — CBC
HCT: 43.3 % (ref 36.0–46.0)
Hemoglobin: 14 g/dL (ref 12.0–15.0)
MCHC: 32.4 g/dL (ref 30.0–36.0)
MCV: 92.3 fl (ref 78.0–100.0)
Platelets: 300 10*3/uL (ref 150.0–400.0)
RBC: 4.69 Mil/uL (ref 3.87–5.11)
RDW: 13.9 % (ref 11.5–15.5)
WBC: 13.5 10*3/uL — ABNORMAL HIGH (ref 4.0–10.5)

## 2022-07-31 LAB — MICROALBUMIN / CREATININE URINE RATIO
Creatinine,U: 39.2 mg/dL
Microalb Creat Ratio: 1.8 mg/g (ref 0.0–30.0)
Microalb, Ur: <0.7 mg/dL (ref 0.0–1.9)

## 2022-07-31 LAB — LIPID PANEL
Cholesterol: 157 mg/dL (ref 0–200)
HDL: 80.8 mg/dL (ref >39.00)
LDL Cholesterol: 59 mg/dL (ref 0–99)
NonHDL: 76.34
Total CHOL/HDL Ratio: 2
Triglycerides: 88 mg/dL (ref 0.0–149.0)
VLDL: 17.6 mg/dL (ref 0.0–40.0)

## 2022-07-31 LAB — HEMOGLOBIN A1C: Hgb A1c MFr Bld: 6.8 % — ABNORMAL HIGH (ref 4.6–6.5)

## 2022-07-31 MED ORDER — GABAPENTIN 300 MG PO CAPS: 600.0000 mg | ORAL_CAPSULE | Freq: Two times a day (BID) | ORAL | 1 refills | Status: DC

## 2022-07-31 NOTE — Progress Notes (Signed)
   Subjective:   Patient ID: Sherry Lambert, female    DOB: 1941-04-11, 81 y.o.   MRN: [id number]  HPI The patient is an 81 YO female coming in for follow up recent events and yearly follow up.  Review of Systems  Constitutional:  Positive for activity change, appetite change and fatigue.  HENT: Negative.    Eyes: Negative.   Respiratory:  Negative for cough, chest tightness and shortness of breath.   Cardiovascular:  Negative for chest pain, palpitations and leg swelling.  Gastrointestinal:  Negative for abdominal distention, abdominal pain, constipation, diarrhea, nausea and vomiting.  Musculoskeletal:  Positive for gait problem and myalgias.  Skin: Negative.   Neurological:  Positive for weakness and numbness.  Psychiatric/Behavioral: Negative.      Objective:  Physical Exam Constitutional:      Appearance: She is well-developed.  HENT:     Head: Normocephalic and atraumatic.  Cardiovascular:     Rate and Rhythm: Normal rate and regular rhythm.  Pulmonary:     Effort: Pulmonary effort is normal. No respiratory distress.     Breath sounds: Normal breath sounds. No wheezing or rales.  Abdominal:     General: Bowel sounds are normal. There is no distension.     Palpations: Abdomen is soft.     Tenderness: There is no abdominal tenderness. There is no rebound.  Musculoskeletal:     Cervical back: Normal range of motion.  Skin:    General: Skin is warm and dry.  Neurological:     Mental Status: She is alert and oriented to person, place, and time. Mental status is at baseline.     Cranial Nerves: Cranial nerve deficit present.     Sensory: Sensory deficit present.     Coordination: Coordination abnormal.     Vitals:   07/31/22 0937  BP: 112/60  Pulse: 87  Temp: 98.5 F (36.9 C)  TempSrc: Oral  SpO2: 92%    Assessment & Plan:  Visit time 20 minutes in face to face communication with patient and coordination of care, additional 10 minutes spent in record review,  coordination or care, ordering tests, communicating/referring to other healthcare professionals, documenting in medical records all on the same day of the visit for total time 30 minutes spent on the visit.

## 2022-07-31 NOTE — Patient Instructions (Addendum)
We have checked the labs today.  We have increased the gabapentin to 2 pills twice a day to help more with the nerves.

## 2022-08-03 ENCOUNTER — Encounter: Payer: Self-pay | Admitting: Internal Medicine

## 2022-08-03 NOTE — Assessment & Plan Note (Signed)
Checking lipid panel and adjust pravastatin 20 mg daily as needed. 

## 2022-08-03 NOTE — Assessment & Plan Note (Signed)
Worse since recent illness and using her normal tramadol 50 mg daily and using gabapentin which we are increasing to 600 mg BID.

## 2022-08-03 NOTE — Assessment & Plan Note (Signed)
Foot exam done and checking labs. She is diet controlled currently and on statin. Using gabapentin for neuropathy. Reminded about eye exam and checking microalbumin to creatinine ratio.

## 2022-08-03 NOTE — Assessment & Plan Note (Signed)
Poor pulses on exam but no signs of active ischemia.

## 2022-08-03 NOTE — Assessment & Plan Note (Signed)
This is worse with recent health complications. We will increase gabapentin to up to 600 mg BID. New rx done.

## 2022-08-05 DIAGNOSIS — J449 Chronic obstructive pulmonary disease, unspecified: Secondary | ICD-10-CM | POA: Diagnosis not present

## 2022-08-05 DIAGNOSIS — J189 Pneumonia, unspecified organism: Secondary | ICD-10-CM | POA: Diagnosis not present

## 2022-08-13 ENCOUNTER — Ambulatory Visit (HOSPITAL_BASED_OUTPATIENT_CLINIC_OR_DEPARTMENT_OTHER): Payer: Medicare HMO | Admitting: Pulmonary Disease

## 2022-08-15 ENCOUNTER — Ambulatory Visit (INDEPENDENT_AMBULATORY_CARE_PROVIDER_SITE_OTHER): Payer: Medicare HMO | Admitting: *Deleted

## 2022-08-15 ENCOUNTER — Ambulatory Visit: Payer: Medicare HMO | Admitting: Internal Medicine

## 2022-08-15 DIAGNOSIS — Z0000 Encounter for general adult medical examination without abnormal findings: Secondary | ICD-10-CM

## 2022-08-15 NOTE — Progress Notes (Signed)
Subjective:   Sherry Lambert is a 81 y.o. female who presents for Medicare Annual (Subsequent) preventive examination. I connected with  Carollee Massed on 08/15/22 by a audio enabled telemedicine application and verified that I am speaking with the correct person using two identifiers.  Patient Location: Home  Provider Location: Home Office  I discussed the limitations of evaluation and management by telemedicine. The patient expressed understanding and agreed to proceed.  Review of Systems    Deferred to PCP Cardiac Risk Factors include: advanced age (>50mn, >>45women);dyslipidemia;hypertension;diabetes mellitus     Objective:    Today's Vitals   08/15/22 1014  PainSc: 2    There is no height or weight on file to calculate BMI.     08/15/2022   10:27 AM 07/15/2022   10:40 AM 02/05/2022    5:06 AM 02/04/2022    3:47 PM 08/14/2021    1:51 PM 06/23/2020    2:11 PM 01/14/2020    6:12 PM  Advanced Directives  Does Patient Have a Medical Advance Directive? _0  No No  Would patient like information on creating a medical advance directive? No - Patient declined  No - Patient declined No - Patient declined No - Patient declined No - Patient declined No - Patient declined    Current Medications (verified) Outpatient Encounter Medications as of 08/15/2022  Medication Sig   acetaminophen (TYLENOL) 500 MG tablet Take 500 mg by mouth daily.    albuterol (PROVENTIL) (2.5 MG/3ML) 0.083% nebulizer solution Take 3 mLs (2.5 mg total) by nebulization every 6 (six) hours as needed for wheezing or shortness of breath.   albuterol (VENTOLIN HFA) 108 (90 Base) MCG/ACT inhaler Inhale 1-2 puffs into the lungs every 4 (four) hours as needed for wheezing or shortness of breath.   ANORO ELLIPTA 62.5-25 MCG/ACT AEPB Inhale 1 puff by mouth once daily   aspirin EC 81 MG tablet Take 1 tablet (81 mg total) by mouth daily.   gabapentin (NEURONTIN) 300 MG capsule Take 2 capsules (600 mg total)  by mouth 2 (two) times daily.   ipratropium-albuterol (DUONEB) 0.5-2.5 (3) MG/3ML SOLN Take 3 mLs by nebulization every 6 (six) hours as needed.   omeprazole (PRILOSEC) 40 MG capsule Take 1 capsule by mouth once daily   Polyvinyl Alcohol-Povidone (REFRESH OP) Place 1 drop into both eyes daily as needed (For dry eyes).   pravastatin (PRAVACHOL) 20 MG tablet Take 1 tablet by mouth once daily   predniSONE (DELTASONE) 5 MG tablet Take 1 tablet (5 mg total) by mouth daily with breakfast.   traMADol (ULTRAM) 50 MG tablet TAKE 1 TABLET BY MOUTH ONCE DAILY AS NEEDED   VITAMIN D PO Take 1 tablet by mouth daily.   cyclobenzaprine (FLEXERIL) 5 MG tablet Take 1 tablet (5 mg total) by mouth 2 (two) times daily as needed for muscle spasms. (Patient not taking: Reported on 08/15/2022)   No facility-administered encounter medications on file as of 08/15/2022.    Allergies (verified) Lipitor [atorvastatin]   History: Past Medical History:  Diagnosis Date   [place]um [place] 07/[place]   [name]    Cataract    Chronic kidney disease    kidney stone   Colitis    Colon cancer ([facility] 06/2017   cecum   Diabetes mellitus without complication ([facility]    no meds taken   Emphysema of lung ([facility])    GERD (gastroesophageal reflux disease)    Hyperlipidemia  Hypertension    Peripheral arterial disease ([facility])    [name]    Stroke [facility])    November 2016- Thanksgiving night   Past Surgical History:  Procedure Laterality Date   ABDOMINAL HYSTERECTOMY     BALLOON DILATION N/A 12/23/2012   Procedure: BALLOON DILATION;  Surgeon: Inda Castle, MD;  Location: [facility];  Service: Endoscopy;  Laterality: N/A;   BALLOON DILATION N/A 01/15/2013   Procedure: BALLOON DILATION;  Surgeon: Inda Castle, MD;  Location: [facility];  Service: Endoscopy;  Laterality: N/A;   BALLOON DILATION N/A 07/22/2018   Procedure: BALLOON DILATION;  Surgeon: Mauri Pole, MD;  Location: [facility]  ENDOSCOPY;  Service: Endoscopy;  Laterality: N/A;   cataract     both eyes   COLONOSCOPY     COLONOSCOPY WITH PROPOFOL N/A 03/05/2017   Procedure: COLONOSCOPY WITH PROPOFOL;  Surgeon: Mauri Pole, MD;  Location: [facility];  Service: Endoscopy;  Laterality: N/A;   DENTAL RESTORATION/EXTRACTION WITH X-RAY     ESOPHAGOGASTRODUODENOSCOPY N/A 12/23/2012   Procedure: ESOPHAGOGASTRODUODENOSCOPY (EGD);  Surgeon: Inda Castle, MD;  Location: [facility];  Service: Endoscopy;  Laterality: N/A;   ESOPHAGOGASTRODUODENOSCOPY N/A 01/15/2013   Procedure: ESOPHAGOGASTRODUODENOSCOPY (EGD);  Surgeon: Inda Castle, MD;  Location: [facility];  Service: Endoscopy;  Laterality: N/A;   ESOPHAGOGASTRODUODENOSCOPY (EGD) WITH PROPOFOL N/A 07/22/2018   Procedure: ESOPHAGOGASTRODUODENOSCOPY (EGD) WITH PROPOFOL;  Surgeon: Mauri Pole, MD;  Location: [facility];  Service: Endoscopy;  Laterality: N/A;   TONSILLECTOMY     Family History  Problem Relation Age of Onset   Hypertension Father    Diabetes Father    Cancer Father        melanoma   Emphysema Mother    Colon cancer Sister 79   Cancer Sister        colon, surgery alone    Emphysema Sister    Early death Neg Hx    Stroke Neg Hx    Esophageal cancer Neg Hx    Rectal cancer Neg Hx    Stomach cancer Neg Hx    Social History   Socioeconomic History   Marital status: Divorced    Spouse name: Not on file   Number of children: 1   Years of education: Not on file   Highest education level: Not on file  Occupational History   Occupation: Retired    Comment: [employer]  Tobacco Use   Smoking status: Former    Packs/day: 1.00    Years: 55.00    Total pack years: 55.00    Types: Cigarettes    Quit date: 10/19/2002    Years since quitting: 19.8   Smokeless tobacco: Never  Vaping Use   Vaping Use: Never used  Substance and Sexual Activity   Alcohol use: No    Alcohol/week: 0.0 standard drinks of alcohol   Drug use: No    Sexual activity: Never    Birth control/protection: Surgical  Other Topics Concern   Not on file  Social History Narrative   Regular exercise-no   Caffeine Use-yes   Social Determinants of Health   Financial Resource Strain: Low Risk  (08/15/2022)   Overall Financial Resource Strain (CARDIA)    Difficulty of Paying Living Expenses: Not hard at all  Food Insecurity: No Food Insecurity (08/15/2022)   Hunger Vital Sign    Worried About Running Out of Food in the Last Year: Never true    Ran Out of Food in the Last  Year: Never true  Transportation Needs: No Transportation Needs (08/15/2022)   PRAPARE - Hydrologist (Medical): No    Lack of Transportation (Non-Medical): No  Physical Activity: Insufficiently Active (08/15/2022)   Exercise Vital Sign    Days of Exercise per Week: 7 days    Minutes of Exercise per Session: 20 min  Stress: No Stress Concern Present (08/15/2022)   [place]    Feeling of Stress : Not at all  Social Connections: Moderately Integrated (08/15/2022)   Social Connection and Isolation Panel [NHANES]    Frequency of Communication with Friends and Family: More than three times a week    Frequency of Social Gatherings with Friends and Family: More than three times a week    Attends Religious Services: 1 to 4 times per year    Active Member of [employer] or Organizations: Yes    Attends Archivist Meetings: Never    Marital Status: Divorced    Tobacco Counseling Counseling given: Not Answered   Clinical Intake:  Pre-visit preparation completed: Yes  Pain : 0-10 Pain Score: 2  Pain Type: Chronic pain Pain Location: Generalized (knee) Pain Descriptors / Indicators: Aching, Discomfort, Dull Pain Relieving Factors: medication  Pain Relieving Factors: medication  Nutritional Status: BMI of 19-24  Normal Nutritional Risks: None Diabetes: Yes CBG done?:  No (phone visit) Did pt. bring in CBG monitor from home?: No (phone visit)  How often do you need to have someone help you when you read instructions, pamphlets, or other written materials from your doctor or pharmacy?: 1 - Never  Diabetic? Yes Nutrition Risk Assessment:  Has the patient had any N/V/D within the last 2 months?  No  Does the patient have any non-healing wounds?  No  Has the patient had any unintentional weight loss or weight gain?  No   Diabetes:  Is the patient diabetic?  Yes  If diabetic, was a CBG obtained today?  No  Did the patient bring in their glucometer from home?  No  How often do you monitor your CBG's? Patient does not check blood sugar at home.   Financial Strains and Diabetes Management:  Are you having any financial strains with the device, your supplies or your medication? No .  Does the patient want to be seen by Chronic Care Management for management of their diabetes?  No  Would the patient like to be referred to a Nutritionist or for Diabetic Management?  No   Diabetic Exams:  Diabetic Eye Exam: Completed 05/10/22 Diabetic Foot Exam: Completed 07/31/22   Interpreter Needed?: No  Information entered by :: Emelia Loron RN   Activities of Daily Living    08/15/2022   10:25 AM 02/05/2022    5:06 AM  In your present state of health, do you have any difficulty performing the following activities:  Hearing? 0 0  Vision? 0 0  Difficulty concentrating or making decisions? 1 0  Comment reports poor memory   Walking or climbing stairs? 1 1  Comment uses walker   Dressing or bathing? 0 0  Doing errands, shopping? 1 0  Preparing Food and eating ? Y   Comment family assist   Using the Toilet? N   In the past six months, have you accidently leaked urine? Y   Comment uses pads   Do you have problems with loss of bowel control? N   Managing your Medications? N  Managing your Finances? N   Housekeeping or managing your Housekeeping? Y   Comment  family assist     Patient Care Team: [name], MD as PCP - General (Internal Medicine) Charlton Haws, [facility] as Pharmacist (Pharmacist) [facility]  Indicate any recent Medical Services you may have received from other than [facility] providers in the past year (date may be approximate).     Assessment:   This is a routine wellness examination for Sherry Lambert.  Hearing/Vision screen No results found.  Dietary issues and exercise activities discussed: Current Exercise Habits: Home exercise routine, Time (Minutes): 25, Frequency (Times/Week): 7, Weekly Exercise (Minutes/Week): 175, Intensity: Mild, Exercise limited by: orthopedic condition(s);respiratory conditions(s)   Goals Addressed             This Visit's Progress    Patient Stated       Continue to be as active as possible by doing chair exercises      Depression Screen    08/15/2022   10:20 AM 07/12/2022    9:58 AM 02/13/2022    8:40 AM 08/14/2021    2:01 PM 08/14/2021    1:57 PM 01/10/2021   10:12 AM 06/23/2020    2:21 PM  PHQ 2/9 Scores  PHQ - 2 Score 0 0 0 0 0 0 0  PHQ- 9 Score   0        Fall Risk    08/15/2022   10:29 AM 07/12/2022    9:58 AM 02/13/2022    8:40 AM 08/14/2021    1:54 PM 01/10/2021   10:12 AM  [place] in the past year? 1 1 0 0 1  Number falls in past yr: 1 1 0 0 0  Injury with Fall? 1 1 0 0 0  Risk for fall due to : History of fall(s);Impaired balance/gait;Impaired mobility Impaired balance/gait;Impaired mobility  No Fall Risks   Follow up Falls evaluation completed;Education provided Falls evaluation completed  Falls prevention discussed Falls evaluation completed    FALL RISK PREVENTION PERTAINING TO THE HOME:  Any stairs in or around the home? Yes  If so, are there any without handrails? No  Home free of loose throw rugs in walkways, pet beds, electrical cords, etc? Yes  Adequate lighting in your home to reduce risk of falls? Yes   ASSISTIVE  DEVICES UTILIZED TO PREVENT FALLS:  Life alert? No  Use of a cane, walker or w/c? Yes  Grab bars in the bathroom? Yes  Shower chair or bench in shower? Yes  Elevated toilet seat or a handicapped toilet? Yes   Cognitive Function:        08/15/2022   10:28 AM  6CIT Screen  What Year? 0 points  What month? 0 points  What time? 0 points  Count back from 20 0 points  Months in reverse 0 points  Repeat phrase 2 points  Total Score 2 points    Immunizations Immunization History  Administered Date(s) Administered   Fluad Quad(high Dose 65+) 06/18/2019, 07/13/2020, 07/19/2021, 07/02/2022   Influenza, High Dose Seasonal PF 07/27/2013, 05/14/2016, 05/20/2017, 06/05/2018   Influenza,inj,Quad PF,6+ Mos 08/03/2014, 06/21/2015   Influenza,inj,quad, With Preservative 06/04/2019   Influenza-Unspecified 07/04/2012, 06/18/2022   PFIZER(Purple Top)SARS-COV-2 Vaccination 11/17/2019, 12/08/2019, 08/17/2020   Pneumococcal Conjugate-13 11/11/2015   Pneumococcal-Unspecified 09/03/2010   Td 09/03/2012    TDAP status: Up to date  Flu Vaccine status: Up to date  Pneumococcal vaccine status: Due, Education has been provided regarding  the importance of this vaccine. Advised may receive this vaccine at local pharmacy or Health Dept. Aware to provide a copy of the vaccination record if obtained from local pharmacy or Health Dept. Verbalized acceptance and understanding.  Covid-19 vaccine status: Information provided on how to obtain vaccines.   Qualifies for Shingles Vaccine? Yes   Zostavax completed No   Shingrix Completed?: No.    Education has been provided regarding the importance of this vaccine. Patient has been advised to call insurance company to determine out of pocket expense if they have not yet received this vaccine. Advised may also receive vaccine at local pharmacy or Health Dept. Verbalized acceptance and understanding.  Screening Tests Health Maintenance  Topic Date Due   DEXA  SCAN  Never done   OPHTHALMOLOGY EXAM  09/17/2016   COVID-19 Vaccine (4 - 2023-24 season) 05/04/2022   Zoster Vaccines- Shingrix (1 of 2) 10/12/2022 (Originally 06/11/1960)   Pneumonia Vaccine 30+ Years old (2 - PPSV23 or PCV20) 07/13/2023 (Originally 01/06/2016)   DTaP/Tdap/Td (2 - Tdap) 09/03/2022   HEMOGLOBIN A1C  01/29/2023   Diabetic kidney evaluation - eGFR measurement  08/01/2023   Diabetic kidney evaluation - Urine ACR  08/01/2023   FOOT EXAM  08/01/2023   Medicare Annual Wellness (AWV)  08/16/2023   INFLUENZA VACCINE  Completed   HPV VACCINES  Aged Out    Health Maintenance  Health Maintenance Due  Topic Date Due   DEXA SCAN  Never done   OPHTHALMOLOGY EXAM  09/17/2016   COVID-19 Vaccine (4 - 2023-24 season) 05/04/2022    Colorectal cancer screening: No longer required.   Mammogram status: No longer required due to age.  Bone Density Status: Patient declines referral  Lung Cancer Screening: (Low Dose CT Chest recommended if Age 72-80 years, 30 pack-year currently smoking OR have quit w/in 15years.) does not qualify.   Additional Screening:  Hepatitis C Screening: does qualify; Completed education provided  Vision Screening: Recommended annual ophthalmology exams for early detection of glaucoma and other disorders of the eye. Is the patient up to date with their annual eye exam?  Yes  Who is the provider or what is the name of the office in which the patient attends annual eye exams? Dr Katy Fitch If pt is not established with a provider, would they like to be referred to a provider to establish care?  N/A .   Dental Screening: Recommended annual dental exams for proper oral hygiene  Community Resource Referral / Chronic Care Management: CRR required this visit?  No   CCM required this visit?  No      Plan:     I have personally reviewed and noted the following in the patient's chart:   Medical and social history Use of alcohol, tobacco or illicit drugs   Current medications and supplements including opioid prescriptions. Patient is currently taking opioid prescriptions. Information provided to patient regarding non-opioid alternatives. Patient advised to discuss non-opioid treatment plan with their provider. Functional ability and status Nutritional status Physical activity Advanced directives List of other physicians Hospitalizations, surgeries, and ER visits in previous 12 months Vitals Screenings to include cognitive, depression, and falls Referrals and appointments  In addition, I have reviewed and discussed with patient certain preventive protocols, quality metrics, and best practice recommendations. A written personalized care plan for preventive services as well as general preventive health recommendations were provided to patient.     Michiel Cowboy, RN   08/15/2022   Nurse Notes:  Sherry Lambert ,  Thank you for taking time to come for your Medicare Wellness Visit. I appreciate your ongoing commitment to your health goals. Please review the following plan we discussed and let me know if I can assist you in the future.   These are the goals we discussed:  Goals      Manage My Medicine     Timeframe:  Long-Range Goal Priority:  High Start Date:   12/07/20                          Expected End Date:        06/08/21               Follow Up Date 04/01/21    - call for medicine refill 2 or 3 days before it runs out - call if I am sick and can't take my medicine - keep a list of all the medicines I take; vitamins and herbals too   -Try Vitamin B Complex for nerves -Try Biotene mouth wash for dry mouth  Why is this important?   These steps will help you keep on track with your medicines.   Notes:      Patient Stated     Continue to be as active as possible by doing chair exercises        This is a list of the screening recommended for you and due dates:  Health Maintenance  Topic Date Due   DEXA scan (bone density  measurement)  Never done   Eye exam for diabetics  09/17/2016   COVID-19 Vaccine (4 - 2023-24 season) 05/04/2022   Zoster (Shingles) Vaccine (1 of 2) 10/12/2022*   Pneumonia Vaccine (2 - PPSV23 or PCV20) 07/13/2023*   DTaP/Tdap/Td vaccine (2 - Tdap) 09/03/2022   Hemoglobin A1C  01/29/2023   Yearly kidney function blood test for diabetes  08/01/2023   Yearly kidney health urinalysis for diabetes  08/01/2023   Complete foot exam   08/01/2023   Medicare Annual Wellness Visit  08/16/2023   Flu Shot  Completed   HPV Vaccine  Aged Out  *Topic was postponed. The date shown is not the original due date.

## 2022-08-15 NOTE — Patient Instructions (Signed)

## 2022-08-16 ENCOUNTER — Ambulatory Visit (INDEPENDENT_AMBULATORY_CARE_PROVIDER_SITE_OTHER): Payer: Medicare HMO | Admitting: Pulmonary Disease

## 2022-08-16 ENCOUNTER — Encounter (HOSPITAL_BASED_OUTPATIENT_CLINIC_OR_DEPARTMENT_OTHER): Payer: Self-pay | Admitting: Pulmonary Disease

## 2022-08-16 VITALS — BP 122/64 | HR 93 | Ht 59.0 in | Wt 113.0 lb

## 2022-08-16 DIAGNOSIS — J432 Centrilobular emphysema: Secondary | ICD-10-CM | POA: Diagnosis not present

## 2022-08-16 MED ORDER — BUDESONIDE 0.5 MG/2ML IN SUSP: 0.5000 mg | Freq: Two times a day (BID) | RESPIRATORY_TRACT | 5 refills | Status: DC

## 2022-08-16 MED ORDER — ARFORMOTEROL TARTRATE 15 MCG/2ML IN NEBU: 15.0000 ug | INHALATION_SOLUTION | Freq: Two times a day (BID) | RESPIRATORY_TRACT | 5 refills | Status: DC

## 2022-08-16 NOTE — Progress Notes (Signed)
Synopsis: Referred in 2017 for COPD by Sherry Lambert, *  Subjective:   PATIENT ID: Sherry Lambert: female DOB: 05-18-1941, MRN: [id number]  Chief Complaint  Patient presents with   Follow-up    Had pneumonia last mo  Nightly oxygen 1.5L   Sherry Lambert is a 81 year old female former smoker with COPD, hx CVA in 2016 who presents for follow-up.   Synopsis: She is a former patient of Sherry Lambert. She was diagnosed with COPD in 2016. Her last COPD exacerbation was one year ago Feb 2021 requiring hospitalization. She transitioned from Trelegy to [facility] in March 2022 due to thrush and reports symptoms remain well-controlled.  12/20/20 Since our last visit, she reports her symptoms are well contolled on Anoro. Sherry Lambert has resolved off inhaled corticosteroids. After sitting three hours outside, she used her Albuterol yesterday for shortness of breath and nasal congestion and believes this is related to the pollen. Otherwise she does routinely use her rescue inhaler. Denies wheezing or cough.  10/10/21 Since our last visit she is compliant with Anoro. She is ambulatory with a walker at home. In the last two weeks she has had worsening shortness of breath and has had to use albuterol once a day. No coughing or wheezing. No fevers or chills. Her last exacerbation was in 05/2021 which was treated with doxycycline. She will do 30 min exercise twice a week with a TV program and the exercises will vary aerobic to strengthening.  02/26/22 Since our last visit she was hospitalized in June for pneumonia. No evidence of aspiration. Improved on antibiotics. [name] with exertion. No wheezing or cough.  08/16/22 Family is present and provides additional history. In the last several months she has had multiple exacerbations including in September, October and November (Augment/Doxy for pneumonia). Duonebs were added to her regimen. At the end of October was started on chronic prednisone 5  mg daily by Sherry Lambert. She continues to have shortness of breath with minimal exertion. Denies cough or wheezing. She is compliant with her meds but does not feel it is effective and not able to inhale properly. Since her stroke she is weak and feels her breathing has declined with her strength.  Prior inhalers: Trelegy - thrush  Social history: Quit in 2004. 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   [name]    Cataract    Chronic kidney disease    kidney stone   Colitis    Colon cancer (Sherry Lambert) 06/2017   cecum   Diabetes mellitus without complication (Sherry Lambert)    no meds taken   Emphysema of lung (HCC)    GERD (gastroesophageal reflux disease)    Hyperlipidemia    Hypertension    Peripheral arterial disease (Sherry Lambert)    [name]    Stroke Sherry Lambert)    November 2016- Thanksgiving night     Allergies  Allergen Reactions   Lipitor [Atorvastatin] Itching     Outpatient Medications Prior to Visit  Medication Sig Dispense Refill   acetaminophen (TYLENOL) 500 MG tablet Take 500 mg by mouth daily.      albuterol (PROVENTIL) (2.5 MG/3ML) 0.083% nebulizer solution Take 3 mLs (2.5 mg total) by nebulization every 6 (six) hours as needed for wheezing or shortness of breath. 360 mL 5   albuterol (VENTOLIN HFA) 108 (90 Base) MCG/ACT inhaler Inhale 1-2 puffs into the lungs every 4 (four) hours as  needed for wheezing or shortness of breath. 18 g 6   ANORO ELLIPTA 62.5-25 MCG/ACT AEPB Inhale 1 puff by mouth once daily 60 each 0   aspirin EC 81 MG tablet Take 1 tablet (81 mg total) by mouth daily. 90 tablet 3   gabapentin (NEURONTIN) 300 MG capsule Take 2 capsules (600 mg total) by mouth 2 (two) times daily. 360 capsule 1   ipratropium-albuterol (DUONEB) 0.5-2.5 (3) MG/3ML SOLN Take 3 mLs by nebulization every 6 (six) hours as needed. 360 mL 2   omeprazole (PRILOSEC) 40 MG capsule Take 1 capsule by mouth once daily 90 capsule 0    Polyvinyl Alcohol-Povidone (REFRESH OP) Place 1 drop into both eyes daily as needed (For dry eyes).     pravastatin (PRAVACHOL) 20 MG tablet Take 1 tablet by mouth once daily 90 tablet 0   predniSONE (DELTASONE) 5 MG tablet Take 1 tablet (5 mg total) by mouth daily with breakfast. 30 tablet 1   traMADol (ULTRAM) 50 MG tablet TAKE 1 TABLET BY MOUTH ONCE DAILY AS NEEDED 30 tablet 5   VITAMIN D PO Take 1 tablet by mouth daily.     cyclobenzaprine (FLEXERIL) 5 MG tablet Take 1 tablet (5 mg total) by mouth 2 (two) times daily as needed for muscle spasms. (Patient not taking: Reported on 08/15/2022) 60 tablet 0   No facility-administered medications prior to visit.    Review of Systems  Constitutional:  Positive for malaise/fatigue. Negative for chills, diaphoresis, fever and weight loss.  HENT:  Negative for congestion.   Respiratory:  Positive for shortness of breath. Negative for cough, hemoptysis, sputum production and wheezing.   Cardiovascular:  Negative for chest pain, palpitations and leg swelling.  Musculoskeletal:  Positive for myalgias.     Objective:   Vitals:   08/16/22 1046  BP: 122/64  Pulse: 93  SpO2: 93%  Weight: 113 lb (51.3 kg)  Height: '4\' 11"'$  (1.499 m)   93%  BMI Readings from Last 3 Encounters:  08/16/22 22.82 kg/m  07/19/22 23.23 kg/m  07/15/22 23.43 kg/m   Wt Readings from Last 3 Encounters:  08/16/22 113 lb (51.3 kg)  07/19/22 115 lb (52.2 kg)  07/15/22 116 lb (52.6 kg)   Physical Exam: General: Frail and chronically ill-appearing, no acute distress HENT: [place], AT Eyes: EOMI, no scleral icterus Respiratory: Diminished but clear to auscultation bilaterally.  No crackles, wheezing or rales Cardiovascular: RRR, -M/R/G, no JVD Extremities:-Edema,-tenderness Neuro: AAO x4, CNII-XII grossly intact Psych: Normal mood, normal affect   Chest Imaging- films reviewed: CTA Chest 10/27/2019-emphysema, bilateral dependent pleural effusions.  No PE, no  significant mediastinal or hilar adenopathy.  Hernia with stomach and abdominal fat contents protruding into the mediastinum. CXR 05/22/21 - COPD. No infiltrate effusion or edema CXR 02/26/22 - Resolution of lower lobe infiltrates  Pulmonary Functions Testing Results:    Latest Ref Rng & Units 11/01/2020    3:05 PM 02/13/2016    1:04 PM  PFT Results  FVC-Pre L 1.82  1.82   FVC-Predicted Pre % 82  76   FVC-Post L 1.80  2.02   FVC-Predicted Post % 81  84   Pre FEV1/FVC % % 48  45   Post FEV1/FCV % % 46  47   FEV1-Pre L 0.87  0.82   FEV1-Predicted Pre % 52  46   FEV1-Post L 0.83  0.94   DLCO uncorrected ml/min/mmHg 9.89  9.75   DLCO UNC% % 60  51   DLCO corrected  ml/min/mmHg 9.89  9.26   DLCO COR %Predicted % 60  49   DLVA Predicted % 80  64   TLC L 5.04    TLC % Predicted % 113    RV % Predicted % 159     2017- severe obstruction with out significant bronchodilator reversibility.  Moderate diffusion impairment.  Flow volume loop supports obstruction.  2019 spirometry: FVC 1.8 (75%) FEV1 0.7 (39%) Ratio 38%  PFT 11/01/20 FVC 1.8 (81%) FEV1 0.83 (50%) Ratio 48  TLC 113% RV 159% DLCO 60% Interpretation: Moderately severe obstructive defect with air trapping and reduced DLCO consistent with emphysema.  ONO study 03/02/22 - SpO2 <88% 15 min 44 seconds. 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 Last COPD hospitalization in 02/2022 with multiple outpatient exacerbation since then. Discussed clinical course and management of COPD/ including bronchodilator/nebulizer regimen and action plan for exacerbation.   COPD with emphysema - not in exacerbation --CONTINUE Anoro ONE puff ONCE a day until nebulizer meds are available --CONTINUE Albuterol as needed for shortness of breath or wheezing. REFILL --START nebulizer regimen as follows:  --Pulmicort/Budesonide ONE neb in the morning and evening  --Brovana/aformoterol ONE  neb in the morning and evening  --Duoneb/ipratropium-albuterol ONE neb in the morning  --Not a candidate for pulmonary rehab right now but let us know if you want physical therapy referral --Discontinue chronic prednisone therapy due to ineffectiveness  GOC --Patient declined palliative referral --Discussed considering what it means to need mechanical ventilation or CPR --Encouraged to continue discussions with family.  Nocturnal hypoxemia --CONTINUE 1L O2 nightly for goal >88%  Goals of Care  Health Maintenance Immunization History  Administered Date(s) Administered   Fluad Quad(high Dose 65+) 06/18/2019, 07/13/2020, 07/19/2021, 07/02/2022   Influenza, High Dose Seasonal PF 07/27/2013, 05/14/2016, 05/20/2017, 06/05/2018   Influenza,inj,Quad PF,6+ Mos 08/03/2014, 06/21/2015   Influenza,inj,quad, With Preservative 06/04/2019   Influenza-Unspecified 07/04/2012, 06/18/2022   PFIZER(Purple Top)SARS-COV-2 Vaccination 11/17/2019, 12/08/2019, 08/17/2020   Pneumococcal Conjugate-13 11/11/2015   Pneumococcal-Unspecified 09/03/2010   Td 09/03/2012   CT Lung Screening - not a candidate. Quit smoking > 15 years ago  I have spent a total time of 40-minutes on the day of the appointment including chart review, data review, collecting history, coordinating care and discussing medical diagnosis and plan with the patient/family. Past medical history, allergies, medications were reviewed. Pertinent imaging, labs and tests included in this note have been reviewed and interpreted independently by me.  Sherry Giebel Rodman Pickle, MD [facility] 08/16/2022 11:22 AM

## 2022-08-16 NOTE — Patient Instructions (Addendum)
COPD with emphysema - not in exacerbation --CONTINUE Anoro ONE puff ONCE a day until nebulizer meds are available --CONTINUE Albuterol as needed for shortness of breath or wheezing. REFILL --START nebulizer regimen as follows:  --Pulmicort/Budesonide ONE neb in the morning and evening  --Brovana/aformoterol ONE neb in the morning and evening  --Duoneb/ipratropium-albuterol ONE neb in the morning  --Not a candidate for pulmonary rehab right now but let us know if you want physical therapy referral  Nocturnal hypoxemia --CONTINUE 1L O2 nightly for goal >88%  Follow-up with me in 3 months

## 2022-08-22 ENCOUNTER — Telehealth: Payer: Self-pay | Admitting: Pulmonary Disease

## 2022-08-22 NOTE — Telephone Encounter (Signed)
Attempted to call patient. Mailbox was full so I was unable to leave message for her. But if patient calls back can we advise her that it is best to not mix the Pulmicort nebs and the Brovana neb given that it will not mix well. This information was given to me by Dr Annamaria Boots. It is best for her to take them separately. Thank you

## 2022-08-22 NOTE — Telephone Encounter (Signed)
PT calling regarding two nebulizer meds she got on a visit with Dr. Loanne Drilling. Can she mix and inhale both or must she take separate? [phone]

## 2022-08-23 NOTE — Telephone Encounter (Signed)
Called pt and spoke with son Orpah Greek letting him know that pt needs to do both neb solutions separately and not mix them. He verbalized understanding. Nothing further needed.

## 2022-08-24 ENCOUNTER — Telehealth: Payer: Self-pay | Admitting: Pulmonary Disease

## 2022-08-24 NOTE — Telephone Encounter (Signed)
Thank you :)

## 2022-08-24 NOTE — Telephone Encounter (Signed)
Spoke with Pt and clarified how to take her nebulizer treatments. Pt stated understanding and nothing further needed.

## 2022-08-27 DIAGNOSIS — J189 Pneumonia, unspecified organism: Secondary | ICD-10-CM | POA: Diagnosis not present

## 2022-08-27 DIAGNOSIS — J449 Chronic obstructive pulmonary disease, unspecified: Secondary | ICD-10-CM | POA: Diagnosis not present

## 2022-08-28 ENCOUNTER — Telehealth: Payer: Self-pay | Admitting: Pulmonary Disease

## 2022-08-28 NOTE — Telephone Encounter (Signed)
Called and spoke with pt letting her know info and recs per TP and she verbalized understanding. Nothing further needed.

## 2022-08-28 NOTE — Telephone Encounter (Signed)
Sorry to hear about her stomach.  Should not have anything to do with the medications.  She definitely needs to call her primary care provider as black tarry stools usually means there is blood in her stools and needs to be seen .  If unable to get in with her primary care provider would recommend urgent care.   If she is unable to tolerate budesonide-that is fine to go ahead and stop it for now. Continue with the rest of the nebulizers.  Keep follow-up appointment with Dr. Loanne Drilling as planned  Please contact office for sooner follow up if symptoms do not improve or worsen or seek emergency care

## 2022-08-28 NOTE — Telephone Encounter (Signed)
Called and spoke with pt who states she has been on the budesonide neb solution for about a week.  States two days ago she felt like she had to use the bathroom and when she went to the bathroom, black tarry substance happened. Pt said that she has also been having bouts of nausea and when she thought she was going to be sick, a lot of air came out.  Pt is wondering if her symptoms could be coming from the budesonide neb sol as that was the most recent medication that she had been prescribed.  Asked pt if she had contacted PCP about her symptoms and she said no that she called [place] since Dr. Loanne Drilling was the one who prescribed her most recent medication. Stated to pt that I would send this to provider for review to see what they had to say.  Tammy, please advise.

## 2022-09-03 ENCOUNTER — Other Ambulatory Visit: Payer: Self-pay | Admitting: Internal Medicine

## 2022-09-05 DIAGNOSIS — J449 Chronic obstructive pulmonary disease, unspecified: Secondary | ICD-10-CM | POA: Diagnosis not present

## 2022-09-05 DIAGNOSIS — J189 Pneumonia, unspecified organism: Secondary | ICD-10-CM | POA: Diagnosis not present

## 2022-09-06 ENCOUNTER — Telehealth: Payer: Self-pay

## 2022-09-06 NOTE — Telephone Encounter (Signed)
Patient states that she did not want to get a bone density done.

## 2022-09-07 NOTE — Telephone Encounter (Signed)
Ok noted, can you put that form back in my box thanks.

## 2022-09-10 ENCOUNTER — Ambulatory Visit: Payer: Medicare HMO | Admitting: Internal Medicine

## 2022-09-27 DIAGNOSIS — Z7951 Long term (current) use of inhaled steroids: Secondary | ICD-10-CM | POA: Diagnosis not present

## 2022-09-27 DIAGNOSIS — J309 Allergic rhinitis, unspecified: Secondary | ICD-10-CM | POA: Diagnosis not present

## 2022-09-27 DIAGNOSIS — K219 Gastro-esophageal reflux disease without esophagitis: Secondary | ICD-10-CM | POA: Diagnosis not present

## 2022-09-27 DIAGNOSIS — I10 Essential (primary) hypertension: Secondary | ICD-10-CM | POA: Diagnosis not present

## 2022-09-27 DIAGNOSIS — R32 Unspecified urinary incontinence: Secondary | ICD-10-CM | POA: Diagnosis not present

## 2022-09-27 DIAGNOSIS — J189 Pneumonia, unspecified organism: Secondary | ICD-10-CM | POA: Diagnosis not present

## 2022-09-27 DIAGNOSIS — H9193 Unspecified hearing loss, bilateral: Secondary | ICD-10-CM | POA: Diagnosis not present

## 2022-09-27 DIAGNOSIS — E785 Hyperlipidemia, unspecified: Secondary | ICD-10-CM | POA: Diagnosis not present

## 2022-09-27 DIAGNOSIS — H269 Unspecified cataract: Secondary | ICD-10-CM | POA: Diagnosis not present

## 2022-09-27 DIAGNOSIS — J449 Chronic obstructive pulmonary disease, unspecified: Secondary | ICD-10-CM | POA: Diagnosis not present

## 2022-09-27 DIAGNOSIS — I69351 Hemiplegia and hemiparesis following cerebral infarction affecting right dominant side: Secondary | ICD-10-CM | POA: Diagnosis not present

## 2022-09-27 DIAGNOSIS — J439 Emphysema, unspecified: Secondary | ICD-10-CM | POA: Diagnosis not present

## 2022-09-27 DIAGNOSIS — Z87891 Personal history of nicotine dependence: Secondary | ICD-10-CM | POA: Diagnosis not present

## 2022-09-27 DIAGNOSIS — Z9181 History of falling: Secondary | ICD-10-CM | POA: Diagnosis not present

## 2022-10-01 ENCOUNTER — Telehealth: Payer: Self-pay | Admitting: Pulmonary Disease

## 2022-10-01 DIAGNOSIS — J449 Chronic obstructive pulmonary disease, unspecified: Secondary | ICD-10-CM

## 2022-10-01 NOTE — Telephone Encounter (Signed)
Spk to patient per provider instructions, states she wanted to make sure she was taking medication correctly.  START nebulizer regimen as follows:             --Pulmicort/Budesonide ONE neb in the morning and evening             --Brovana/aformoterol ONE neb in the morning and evening             --Duoneb/ipratropium-albuterol ONE neb in the morning

## 2022-10-01 NOTE — Telephone Encounter (Signed)
Please order new nebulizer

## 2022-10-01 NOTE — Telephone Encounter (Signed)
Pls call. Pt wants to order new nebulizer. # on file OK.

## 2022-10-01 NOTE — Telephone Encounter (Signed)
Wants to be sure she is using her nebulizer machine correctly. More specifically how far apart should she take her meds/solution.  Pls call @ # on file.

## 2022-10-01 NOTE — Telephone Encounter (Signed)
Please advise Spk with patient states her neb machine is old and she wants a new one just incase it breaks down.

## 2022-10-06 DIAGNOSIS — J189 Pneumonia, unspecified organism: Secondary | ICD-10-CM | POA: Diagnosis not present

## 2022-10-06 DIAGNOSIS — J449 Chronic obstructive pulmonary disease, unspecified: Secondary | ICD-10-CM | POA: Diagnosis not present

## 2022-10-25 ENCOUNTER — Telehealth: Payer: Self-pay | Admitting: Pulmonary Disease

## 2022-10-25 NOTE — Telephone Encounter (Signed)
Patient called to inform the nurse and doctor that the nebulizer she has been taking for a few days is not helping her much.  She would like to speak with the nurse to discuss further.  Please advise and call patient at [phone]

## 2022-10-25 NOTE — Telephone Encounter (Signed)
Spk with pt states combo neb medication is not helping her. States the rescue inhaler is working great and she's feeling good

## 2022-10-26 ENCOUNTER — Telehealth (HOSPITAL_BASED_OUTPATIENT_CLINIC_OR_DEPARTMENT_OTHER): Payer: Self-pay | Admitting: Pulmonary Disease

## 2022-10-26 NOTE — Telephone Encounter (Signed)
Pt. Calling back to say she is doing fine with meds

## 2022-10-26 NOTE — Telephone Encounter (Signed)
Called and spoke with pt who states she is doing fine on her meds. Nothing further needed.

## 2022-10-28 DIAGNOSIS — J449 Chronic obstructive pulmonary disease, unspecified: Secondary | ICD-10-CM | POA: Diagnosis not present

## 2022-10-28 DIAGNOSIS — J189 Pneumonia, unspecified organism: Secondary | ICD-10-CM | POA: Diagnosis not present

## 2022-10-29 ENCOUNTER — Encounter (HOSPITAL_BASED_OUTPATIENT_CLINIC_OR_DEPARTMENT_OTHER): Payer: Self-pay

## 2022-10-29 ENCOUNTER — Telehealth: Payer: Self-pay | Admitting: Pulmonary Disease

## 2022-10-29 ENCOUNTER — Other Ambulatory Visit: Payer: Self-pay

## 2022-10-29 ENCOUNTER — Emergency Department (HOSPITAL_BASED_OUTPATIENT_CLINIC_OR_DEPARTMENT_OTHER): Payer: Medicare HMO

## 2022-10-29 ENCOUNTER — Emergency Department (HOSPITAL_BASED_OUTPATIENT_CLINIC_OR_DEPARTMENT_OTHER)
Admission: EM | Admit: 2022-10-29 | Discharge: 2022-10-29 | Disposition: A | Discharge status: Home / Self Care | Payer: Medicare HMO | Attending: Emergency Medicine | Admitting: Emergency Medicine

## 2022-10-29 ENCOUNTER — Other Ambulatory Visit (HOSPITAL_BASED_OUTPATIENT_CLINIC_OR_DEPARTMENT_OTHER): Payer: Self-pay

## 2022-10-29 DIAGNOSIS — J449 Chronic obstructive pulmonary disease, unspecified: Secondary | ICD-10-CM | POA: Diagnosis not present

## 2022-10-29 DIAGNOSIS — Z79899 Other long term (current) drug therapy: Secondary | ICD-10-CM | POA: Diagnosis not present

## 2022-10-29 DIAGNOSIS — Z7982 Long term (current) use of aspirin: Secondary | ICD-10-CM | POA: Diagnosis not present

## 2022-10-29 DIAGNOSIS — J209 Acute bronchitis, unspecified: Secondary | ICD-10-CM | POA: Diagnosis not present

## 2022-10-29 DIAGNOSIS — R0602 Shortness of breath: Secondary | ICD-10-CM | POA: Diagnosis not present

## 2022-10-29 DIAGNOSIS — Z7951 Long term (current) use of inhaled steroids: Secondary | ICD-10-CM | POA: Insufficient documentation

## 2022-10-29 DIAGNOSIS — R079 Chest pain, unspecified: Secondary | ICD-10-CM | POA: Diagnosis not present

## 2022-10-29 DIAGNOSIS — Z1152 Encounter for screening for COVID-19: Secondary | ICD-10-CM | POA: Diagnosis not present

## 2022-10-29 LAB — CBC WITH DIFFERENTIAL/PLATELET
Abs Immature Granulocytes: 0.02 10*3/uL (ref 0.00–0.07)
Basophils Absolute: 0 10*3/uL (ref 0.0–0.1)
Basophils Relative: 0 %
Eosinophils Absolute: 0 10*3/uL (ref 0.0–0.5)
Eosinophils Relative: 0 %
HCT: 41.7 % (ref 36.0–46.0)
Hemoglobin: 13.1 g/dL (ref 12.0–15.0)
Immature Granulocytes: 0 %
Lymphocytes Relative: 12 %
Lymphs Abs: 1.1 10*3/uL (ref 0.7–4.0)
MCH: 28.6 pg (ref 26.0–34.0)
MCHC: 31.4 g/dL (ref 30.0–36.0)
MCV: 91 fL (ref 80.0–100.0)
Monocytes Absolute: 0.5 10*3/uL (ref 0.1–1.0)
Monocytes Relative: 6 %
Neutro Abs: 7.6 10*3/uL (ref 1.7–7.7)
Neutrophils Relative %: 82 %
Platelets: 235 10*3/uL (ref 150–400)
RBC: 4.58 MIL/uL (ref 3.87–5.11)
RDW: 13 % (ref 11.5–15.5)
WBC: 9.3 10*3/uL (ref 4.0–10.5)
nRBC: 0 % (ref 0.0–0.2)

## 2022-10-29 LAB — BASIC METABOLIC PANEL
Anion gap: 9 (ref 5–15)
BUN: 11 mg/dL (ref 8–23)
CO2: 27 mmol/L (ref 22–32)
Calcium: 10.4 mg/dL — ABNORMAL HIGH (ref 8.9–10.3)
Chloride: 105 mmol/L (ref 98–111)
Creatinine, Ser: 0.87 mg/dL (ref 0.44–1.00)
GFR, Estimated: >60 mL/min (ref >60)
Glucose, Bld: 136 mg/dL — ABNORMAL HIGH (ref 70–99)
Potassium: 3.8 mmol/L (ref 3.5–5.1)
Sodium: 141 mmol/L (ref 135–145)

## 2022-10-29 LAB — RESP PANEL BY RT-PCR (RSV, FLU A&B, COVID)  RVPGX2
Influenza A by PCR: NEGATIVE
Influenza B by PCR: NEGATIVE
Resp Syncytial Virus by PCR: NEGATIVE
SARS Coronavirus 2 by RT PCR: NEGATIVE

## 2022-10-29 MED ORDER — DOXYCYCLINE HYCLATE 100 MG PO CAPS
100.0000 mg | ORAL_CAPSULE | Freq: Two times a day (BID) | ORAL | 0 refills | Status: DC
  Filled 2022-10-29: qty 20, 10d supply, fill #0

## 2022-10-29 NOTE — ED Triage Notes (Signed)
Patient here POV from Home.  Endorses SOB for a few days that worsened this AM.No Fevers at Home.   No New Pain.   NAD Noted during Triage. A&Ox4. GCS 15. BIB Personal Wheelchair.

## 2022-10-29 NOTE — ED Provider Notes (Signed)
[facility] Provider Note   CSN: [id number] Arrival date & time: 10/29/22  1433     History {Add pertinent medical, surgical, social history, OB history to HPI:1} Chief Complaint  Patient presents with   [name]    Sherry Lambert is a 82 y.o. female.  She has a history of COPD uses oxygen at night has multiple inhalers has a pulmonologist.  She is brought in by her son for increased shortness of breath for the last 2 days.  Nonproductive cough.  No fever but has felt hot and cold.  No chest pain nausea vomiting.  Using inhaler, rescue inhaler, nebulizer without improvement in symptoms.  The history is provided by the patient.  [name] Severity:  Moderate Onset quality:  Gradual Duration:  2 days Timing:  Constant Progression:  Unchanged Chronicity:  Recurrent Relieved by:  Nothing Worsened by:  Activity and coughing Ineffective treatments:  Inhaler Associated symptoms: cough   Associated symptoms: no abdominal pain, no chest pain, no fever, no hemoptysis, no sputum production and no vomiting   Risk factors: no tobacco use        Home Medications Prior to Admission medications   Medication Sig Start Date End Date Taking? Authorizing Provider  acetaminophen (TYLENOL) 500 MG tablet Take 500 mg by mouth daily.     [provider]  albuterol (PROVENTIL) (2.5 MG/3ML) 0.083% nebulizer solution Take 3 mLs (2.5 mg total) by nebulization every 6 (six) hours as needed for wheezing or shortness of breath. 05/22/21   Chesley Mires, MD  albuterol (VENTOLIN HFA) 108 (90 Base) MCG/ACT inhaler Inhale 1-2 puffs into the lungs every 4 (four) hours as needed for wheezing or shortness of breath. 02/26/22   [name], MD  arformoterol (BROVANA) 15 MCG/2ML NEBU Take 2 mLs (15 mcg total) by nebulization in the morning and at bedtime. 08/16/22   [name], MD  aspirin EC 81 MG tablet Take 1 tablet (81 mg total)  by mouth daily. 12/20/14   Rosalin Hawking, MD  budesonide (PULMICORT) 0.5 MG/2ML nebulizer solution Take 2 mLs (0.5 mg total) by nebulization in the morning and at bedtime. 08/16/22   [name], MD  gabapentin (NEURONTIN) 300 MG capsule Take 2 capsules (600 mg total) by mouth 2 (two) times daily. 07/31/22   [name], MD  ipratropium-albuterol (DUONEB) 0.5-2.5 (3) MG/3ML SOLN Take 3 mLs by nebulization every 6 (six) hours as needed. 06/25/22   [name], MD  omeprazole (PRILOSEC) 40 MG capsule Take 1 capsule by mouth once daily 07/16/22   [name], MD  Polyvinyl Alcohol-Povidone (REFRESH OP) Place 1 drop into both eyes daily as needed (For dry eyes).    [provider]  pravastatin (PRAVACHOL) 20 MG tablet Take 1 tablet by mouth once daily 09/04/22   [name], MD  traMADol (ULTRAM) 50 MG tablet TAKE 1 TABLET BY MOUTH ONCE DAILY AS NEEDED 07/05/22   [name], MD  VITAMIN D PO Take 1 tablet by mouth daily.    [provider]      Allergies    Lipitor [atorvastatin]    Review of Systems   Review of Systems  Constitutional:  Negative for fever.  Respiratory:  Positive for cough and shortness of breath. Negative for hemoptysis and sputum production.   Cardiovascular:  Negative for chest pain.  Gastrointestinal:  Negative for abdominal pain and vomiting.    Physical  Exam Updated Vital Signs BP (!) 174/73   Pulse 88   Temp 98.9 F (37.2 C) (Oral)   Resp (!) 22   Ht '4\' 11"'$  (1.499 m)   Wt 51.3 kg   SpO2 96%   BMI 22.84 kg/m  Physical Exam Vitals and nursing note reviewed.  Constitutional:      General: She is not in acute distress.    Appearance: She is well-developed.  HENT:     Head: Normocephalic and atraumatic.  Eyes:     Conjunctiva/sclera: Conjunctivae normal.  Cardiovascular:     Rate and Rhythm: Normal rate and regular rhythm.     Heart sounds: No murmur heard. Pulmonary:     Effort: Pulmonary  effort is normal. No respiratory distress.     Breath sounds: Normal breath sounds.  Abdominal:     Palpations: Abdomen is soft.     Tenderness: There is no abdominal tenderness.  Musculoskeletal:        General: No swelling. Normal range of motion.     Cervical back: Neck supple.     Right lower leg: No tenderness. No edema.     Left lower leg: No tenderness. No edema.  Skin:    General: Skin is warm and dry.     Capillary Refill: Capillary refill takes less than 2 seconds.  Neurological:     General: No focal deficit present.     Mental Status: She is alert.     ED Results / Procedures / Treatments   Labs (all labs ordered are listed, but only abnormal results are displayed) Labs Reviewed  RESP PANEL BY RT-PCR (RSV, FLU A&B, COVID)  [id number]  CBC WITH DIFFERENTIAL/PLATELET  BASIC METABOLIC PANEL    EKG EKG Interpretation  Date/Time:  Monday October 29 2022 14:46:32 EST Ventricular Rate:  83 PR Interval:  154 QRS Duration: 82 QT Interval:  358 QTC Calculation: 421 R Axis:   69 Text Interpretation: Sinus rhythm No significant change since prior 6/23 Confirmed by Aletta Edouard [id number]) on 10/29/2022 2:55:22 PM  Radiology DG Chest Portable 1 View  Result Date: 10/29/2022 CLINICAL DATA:  [name].  Back pain. EXAM: PORTABLE CHEST 1 VIEW COMPARISON:  Chest and rib radiographs 07/15/2022. FINDINGS: Clear lungs. Stable cardiac and mediastinal contours. Chronic rib fracture deformities of the right posterior chest wall. No pleural effusion or pneumothorax. IMPRESSION: No evidence of acute cardiopulmonary disease. Electronically Signed   By: Emmit Alexanders M.D.   On: 10/29/2022 14:59    Procedures Procedures  {Document cardiac monitor, telemetry assessment procedure when appropriate:1}  Medications Ordered in ED Medications - No data to display  ED Course/ Medical Decision Making/ A&P Clinical Course as of 10/29/22 1504  Mon Oct 29, 2022  1455 Chest x-ray  interpreted by me as no acute infiltrates.  Awaiting radiology reading. [MB]    Clinical Course User Index [MB] Hayden Rasmussen, MD   {   Click here for ABCD2, HEART and other calculatorsREFRESH Note before signing :1}                          Medical Decision Making  This patient complains of ***; this involves an extensive number of treatment Options and is a complaint that carries with it a high risk of complications and morbidity. The differential includes ***  I ordered, reviewed and interpreted labs, which included *** I ordered medication *** and reviewed PMP when indicated. I ordered imaging studies  which included *** and I independently    visualized and interpreted imaging which showed *** Additional history obtained from *** Previous records obtained and reviewed *** I consulted *** and discussed lab and imaging findings and discussed disposition.  Cardiac monitoring reviewed, *** Social determinants considered, *** Critical Interventions: ***  After the interventions stated above, I reevaluated the patient and found *** Admission and further testing considered, ***   {Document critical care time when appropriate:1} {Document review of labs and clinical decision tools ie heart score, Chads2Vasc2 etc:1}  {Document your independent review of radiology images, and any outside records:1} {Document your discussion with family members, caretakers, and with consultants:1} {Document social determinants of health affecting pt's care:1} {Document your decision making why or why not admission, treatments were needed:1} Final Clinical Impression(s) / ED Diagnoses Final diagnoses:  None    Rx / DC Orders ED Discharge Orders     None

## 2022-10-29 NOTE — Discharge Instructions (Signed)
You are seen in the emergency department for increased shortness of breath.  Your chest x-ray did not show any pneumonia and your COVID and flu test were negative.  Please continue your regular breathing treatments and we are prescribing you an antibiotic for possible infection.  Follow-up with your primary care doctor and your pulmonology team.  Return to the emergency department if any worsening or concerning symptoms

## 2022-10-29 NOTE — ED Notes (Signed)
Pt given discharge instructions and reviewed prescriptions. Opportunities given for questions. Pt verbalizes understanding. Leanne Chang, RN

## 2022-10-29 NOTE — Telephone Encounter (Signed)
Called and spoke with pt's daughter Maudie Mercury who stated that pt had been having problems with her breathing and actually was taken to the ED. Nothing further needed.

## 2022-10-29 NOTE — Telephone Encounter (Signed)
Kim daughter in law states patient having shortness of breath. States Nebulizer solution not working. Kim phone number is [phone].

## 2022-11-04 ENCOUNTER — Telehealth: Payer: Self-pay | Admitting: Pulmonary Disease

## 2022-11-04 DIAGNOSIS — J189 Pneumonia, unspecified organism: Secondary | ICD-10-CM | POA: Diagnosis not present

## 2022-11-04 DIAGNOSIS — J449 Chronic obstructive pulmonary disease, unspecified: Secondary | ICD-10-CM | POA: Diagnosis not present

## 2022-11-04 MED ORDER — PREDNISONE 20 MG PO TABS: 40.0000 mg | ORAL_TABLET | Freq: Every day | ORAL | 0 refills | Status: AC

## 2022-11-04 NOTE — Telephone Encounter (Signed)
Contacted after-hours service.  Connected with patient daughter-in-law.  Was seen last week in the ED.  Sounds like cough and congestion and worsening shortness of breath.  Given antibiotic.  No steroid.  Chest x-ray on my review interpretation is clear.  With antibiotic therapy, no real improvement it sounds like.  Or if so very short-lived and now back in similar situation.  Prednisone 40 mg daily for 5 days sent to local pharmacy.  Instructed if worsening she must go to ED.  Asked her to call our office in the next 48 hours if not improving.  Would expect to see significant proved by Tuesday.  She has an upcoming appointment in our office.

## 2022-11-05 ENCOUNTER — Telehealth: Payer: Self-pay

## 2022-11-05 NOTE — Telephone Encounter (Signed)
     Patient  visit on 2/26  at [facility]   Have you been able to follow up with your primary care physician? Yes    The patient was or was not able to obtain any needed medicine or equipment. Yes   Are there diet recommendations that you are having difficulty following? Na   Patient expresses understanding of discharge instructions and education provided has no other needs at this time.  Yes      [facility] [phone] [address], [place], [place] [address] Phone: [phone] Email: [email or web address]

## 2022-11-12 ENCOUNTER — Telehealth (HOSPITAL_BASED_OUTPATIENT_CLINIC_OR_DEPARTMENT_OTHER): Payer: Self-pay | Admitting: Pulmonary Disease

## 2022-11-12 ENCOUNTER — Encounter (HOSPITAL_BASED_OUTPATIENT_CLINIC_OR_DEPARTMENT_OTHER): Payer: Self-pay | Admitting: Pulmonary Disease

## 2022-11-12 ENCOUNTER — Ambulatory Visit (HOSPITAL_BASED_OUTPATIENT_CLINIC_OR_DEPARTMENT_OTHER): Payer: Medicare HMO | Admitting: Pulmonary Disease

## 2022-11-12 ENCOUNTER — Other Ambulatory Visit: Payer: Self-pay | Admitting: Internal Medicine

## 2022-11-12 VITALS — BP 122/62 | HR 80 | Ht 59.0 in | Wt 106.0 lb

## 2022-11-12 DIAGNOSIS — R29898 Other symptoms and signs involving the musculoskeletal system: Secondary | ICD-10-CM

## 2022-11-12 DIAGNOSIS — G4734 Idiopathic sleep related nonobstructive alveolar hypoventilation: Secondary | ICD-10-CM | POA: Diagnosis not present

## 2022-11-12 DIAGNOSIS — J432 Centrilobular emphysema: Secondary | ICD-10-CM | POA: Diagnosis not present

## 2022-11-12 MED ORDER — IPRATROPIUM-ALBUTEROL 0.5-2.5 (3) MG/3ML IN SOLN: 3.0000 mL | Freq: Four times a day (QID) | RESPIRATORY_TRACT | 2 refills | Status: DC | PRN

## 2022-11-12 MED ORDER — ALBUTEROL SULFATE HFA 108 (90 BASE) MCG/ACT IN AERS: 1.0000 | INHALATION_SPRAY | RESPIRATORY_TRACT | 6 refills | Status: DC | PRN

## 2022-11-12 NOTE — Progress Notes (Signed)
Synopsis: Referred in 2017 for COPD by [name], *  Subjective:   PATIENT ID: Sherry Lambert DOB: 1940/09/16, MRN: [id number]  Chief Complaint  Patient presents with   Follow-up    Still having sob, not as bad    Sherry Lambert is a 82 year old Lambert former smoker with COPD, hx CVA in 2016 who presents for follow-up.   Synopsis: She is a former patient of Dr. Carlis Abbott. She was diagnosed with COPD in 2016. Her last COPD exacerbation was one year ago Feb 2021 requiring hospitalization. She transitioned from Trelegy to [facility] in March 2022 due to thrush and reports symptoms remain well-controlled.  12/20/20 Since our last visit, she reports her symptoms are well contolled on Anoro. Sherry Lambert has resolved off inhaled corticosteroids. After sitting three hours outside, she used her Albuterol yesterday for shortness of breath and nasal congestion and believes this is related to the pollen. Otherwise she does routinely use her rescue inhaler. Denies wheezing or cough.  10/10/21 Since our last visit she is compliant with Anoro. She is ambulatory with a walker at home. In the last two weeks she has had worsening shortness of breath and has had to use albuterol once a day. No coughing or wheezing. No fevers or chills. Her last exacerbation was in 05/2021 which was treated with doxycycline. She will do 30 min exercise twice a week with a TV program and the exercises will vary aerobic to strengthening.  02/26/22 Since our last visit she was hospitalized in June for pneumonia. No evidence of aspiration. Improved on antibiotics. [name] with exertion. No wheezing or cough.  08/16/22 Family is present and provides additional history. In the last several months she has had multiple exacerbations including in September, October and November (Augment/Doxy for pneumonia). Duonebs were added to her regimen. At the end of October was started on chronic prednisone 5 mg daily by  Dr. Valeta Harms. She continues to have shortness of breath with minimal exertion. Denies cough or wheezing. She is compliant with her meds but does not feel it is effective and not able to inhale properly. Since her stroke she is weak and feels her breathing has declined with her strength.  11/12/22 Present with son who provides additional history. Since our last visit she has called the office to report the combo nebs are not effective and was seen in the ED on 10/29/22. Discharged with doxycycline, no new O2 requirements. Called office after hours on 11/04/22 and prescribed prednisone 40 mg x 5 days. Compliant with her nebulizer regimen but does feel that the steroid nebulizer causes a filmy taste. Son reports she is overall improved on this regimen with only shortness of breath on exertion. No cough or wheezing. Compliant with nocturnal oxygen.   Prior inhalers: Trelegy - thrush  Social history: Quit in 2004. 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   [name]    Cataract    Chronic kidney disease    kidney stone   Colitis    Colon cancer ([facility]) 06/2017   cecum   Diabetes mellitus without complication ([facility])    no meds taken   Emphysema of lung ([facility])    GERD (gastroesophageal reflux disease)    Hyperlipidemia    Hypertension    Peripheral arterial disease ([facility])    [name]    Stroke [facility])    November 2016- Thanksgiving night  Allergies  Allergen Reactions   Lipitor [Atorvastatin] Itching     Outpatient Medications Prior to Visit  Medication Sig Dispense Refill   acetaminophen (TYLENOL) 500 MG tablet Take 500 mg by mouth daily.      albuterol (PROVENTIL) (2.5 MG/3ML) 0.083% nebulizer solution Take 3 mLs (2.5 mg total) by nebulization every 6 (six) hours as needed for wheezing or shortness of breath. 360 mL 5   albuterol (VENTOLIN HFA) 108 (90 Base) MCG/ACT inhaler Inhale 1-2 puffs into the lungs every 4 (four)  hours as needed for wheezing or shortness of breath. 18 g 6   arformoterol (BROVANA) 15 MCG/2ML NEBU Take 2 mLs (15 mcg total) by nebulization in the morning and at bedtime. 120 mL 5   aspirin EC 81 MG tablet Take 1 tablet (81 mg total) by mouth daily. 90 tablet 3   budesonide (PULMICORT) 0.5 MG/2ML nebulizer solution Take 2 mLs (0.5 mg total) by nebulization in the morning and at bedtime. 120 mL 5   gabapentin (NEURONTIN) 300 MG capsule Take 2 capsules (600 mg total) by mouth 2 (two) times daily. 360 capsule 1   ipratropium-albuterol (DUONEB) 0.5-2.5 (3) MG/3ML SOLN Take 3 mLs by nebulization every 6 (six) hours as needed. 360 mL 2   omeprazole (PRILOSEC) 40 MG capsule Take 1 capsule by mouth once daily 90 capsule 0   Polyvinyl Alcohol-Povidone (REFRESH OP) Place 1 drop into both eyes daily as needed (For dry eyes).     pravastatin (PRAVACHOL) 20 MG tablet Take 1 tablet by mouth once daily 90 tablet 3   traMADol (ULTRAM) 50 MG tablet TAKE 1 TABLET BY MOUTH ONCE DAILY AS NEEDED 30 tablet 5   VITAMIN D PO Take 1 tablet by mouth daily.     doxycycline (VIBRAMYCIN) 100 MG capsule Take 1 capsule (100 mg total) by mouth 2 (two) times daily. 20 capsule 0   No facility-administered medications prior to visit.    Review of Systems  Constitutional:  Negative for chills, diaphoresis, fever, malaise/fatigue and weight loss.  HENT:  Negative for congestion.   Respiratory:  Positive for shortness of breath. Negative for cough, hemoptysis, sputum production and wheezing.   Cardiovascular:  Negative for chest pain, palpitations and leg swelling.     Objective:   Vitals:   11/12/22 1047  BP: 122/62  Pulse: 80  SpO2: 94%  Weight: 106 lb (48.1 kg)  Height: '4\' 11"'$  (1.499 m)   94%  BMI Readings from Last 3 Encounters:  11/12/22 21.41 kg/m  10/29/22 22.84 kg/m  08/16/22 22.82 kg/m   Wt Readings from Last 3 Encounters:  11/12/22 106 lb (48.1 kg)  10/29/22 113 lb 1.5 oz (51.3 kg)  08/16/22 113  lb (51.3 kg)   Physical Exam: General: Frail and chronically ill-appearing, no acute distress HENT: [place], AT Eyes: EOMI, no scleral icterus Respiratory: Diminished but clear to auscultation bilaterally.  No crackles, wheezing or rales Cardiovascular: RRR, -M/R/G, no JVD Extremities:-Edema,-tenderness Neuro: AAO x4, CNII-XII grossly intact Psych: Normal mood, normal affect  Chest Imaging- films reviewed: CTA Chest 10/27/2019-emphysema, bilateral dependent pleural effusions.  No PE, no significant mediastinal or hilar adenopathy.  Hernia with stomach and abdominal fat contents protruding into the mediastinum. CXR 05/22/21 - COPD. No infiltrate effusion or edema CXR 02/26/22 - Resolution of lower lobe infiltrates  Pulmonary Functions Testing Results:    Latest Ref Rng & Units 11/01/2020    3:05 PM 02/13/2016    1:04 PM  PFT Results  FVC-Pre L 1.82  1.82   FVC-Predicted Pre % 82  76   FVC-Post L 1.80  2.02   FVC-Predicted Post % 81  84   Pre FEV1/FVC % % 48  45   Post FEV1/FCV % % 46  47   FEV1-Pre L 0.87  0.82   FEV1-Predicted Pre % 52  46   FEV1-Post L 0.83  0.94   DLCO uncorrected ml/min/mmHg 9.89  9.75   DLCO UNC% % 60  51   DLCO corrected ml/min/mmHg 9.89  9.26   DLCO COR %Predicted % 60  49   DLVA Predicted % 80  64   TLC L 5.04    TLC % Predicted % 113    RV % Predicted % 159     2017- severe obstruction with out significant bronchodilator reversibility.  Moderate diffusion impairment.  Flow volume loop supports obstruction.  2019 spirometry: FVC 1.8 (75%) FEV1 0.7 (39%) Ratio 38%  PFT 11/01/20 FVC 1.8 (81%) FEV1 0.83 (50%) Ratio 48  TLC 113% RV 159% DLCO 60% Interpretation: Moderately severe obstructive defect with air trapping and reduced DLCO consistent with emphysema.  ONO study 03/02/22 - SpO2 <88% 15 min 44 seconds. Nadir SpO2 83%     Assessment & Plan:  82 year old Lambert with COPD with emphysema who presents for follow-up. Last exacerbation 11/2022. Discussed  clinical course and management of COPD including bronchodilator regimen, preventive care including vaccinations and action plan for exacerbation.  COPD with emphysema - last exacerbation last month --CONTINUE nebulizer regimen as follows:  --Pulmicort/Budesonide ONE neb in the morning and evening. OK to try ONCE a day  --Brovana/aformoterol ONE neb in the morning and evening  --Duoneb/ipratropium-albuterol ONE neb in the morning   --CONTINUE Albuterol as needed for shortness of breath or wheezing --Not a candidate for pulmonary rehab right now. Will refer to home health PT  --Discontinue chronic prednisone therapy due to ineffectiveness  Nocturnal hypoxemia --CONTINUE 1L O2 nightly for goal >88%  GOC --Patient declined palliative referral --Discussed considering what it means to need mechanical ventilation or CPR --Encouraged to continue discussions with family.  Goals of Care  Health Maintenance Immunization History  Administered Date(s) Administered   Fluad Quad(high Dose 65+) 06/18/2019, 07/13/2020, 07/19/2021, 07/02/2022   Influenza, High Dose Seasonal PF 07/27/2013, 05/14/2016, 05/20/2017, 06/05/2018   Influenza,inj,Quad PF,6+ Mos 08/03/2014, 06/21/2015   Influenza,inj,quad, With Preservative 06/04/2019   Influenza-Unspecified 07/04/2012, 06/18/2022   PFIZER(Purple Top)SARS-COV-2 Vaccination 11/17/2019, 12/08/2019, 08/17/2020   Pneumococcal Conjugate-13 11/11/2015   Pneumococcal-Unspecified 09/03/2010   Td 09/03/2012   CT Lung Screening - not a candidate. Quit smoking > 15 years ago  I have spent a total time of 35-minutes on the day of the appointment including chart review, data review, collecting history, coordinating care and discussing medical diagnosis and plan with the patient/family. Past medical history, allergies, medications were reviewed. Pertinent imaging, labs and tests included in this note have been reviewed and interpreted independently by me.  Zeya Balles Rodman Pickle, MD [facility] 11/12/2022 10:59 AM

## 2022-11-12 NOTE — Telephone Encounter (Signed)
Patient had request for new nebulizer on 10/01/22. Could you please look into this?

## 2022-11-12 NOTE — Patient Instructions (Addendum)
  COPD with emphysema - last exacerbation last month --CONTINUE nebulizer regimen as follows:  --Pulmicort/Budesonide ONE neb in the morning and evening. OK to try ONCE a day  --Brovana/aformoterol ONE neb in the morning and evening  --Duoneb/ipratropium-albuterol ONE neb in the morning  --Check in with DME regarding nebulizer order --Not a candidate for pulmonary rehab right now. Will refer to home health PT for deconditioning  Nocturnal hypoxemia --CONTINUE 1L O2 nightly for goal >88%  Follow-up with me in 3 months

## 2022-11-13 DIAGNOSIS — J449 Chronic obstructive pulmonary disease, unspecified: Secondary | ICD-10-CM | POA: Diagnosis not present

## 2022-11-15 ENCOUNTER — Other Ambulatory Visit: Payer: Self-pay | Admitting: *Deleted

## 2022-11-15 MED ORDER — IPRATROPIUM-ALBUTEROL 0.5-2.5 (3) MG/3ML IN SOLN: 3.0000 mL | Freq: Four times a day (QID) | RESPIRATORY_TRACT | 2 refills | Status: DC | PRN

## 2022-11-19 ENCOUNTER — Ambulatory Visit: Payer: Self-pay | Admitting: Licensed Clinical Social Worker

## 2022-11-19 ENCOUNTER — Telehealth: Payer: Self-pay | Admitting: Internal Medicine

## 2022-11-19 DIAGNOSIS — J439 Emphysema, unspecified: Secondary | ICD-10-CM | POA: Diagnosis not present

## 2022-11-19 DIAGNOSIS — I129 Hypertensive chronic kidney disease with stage 1 through stage 4 chronic kidney disease, or unspecified chronic kidney disease: Secondary | ICD-10-CM | POA: Diagnosis not present

## 2022-11-19 DIAGNOSIS — Z87442 Personal history of urinary calculi: Secondary | ICD-10-CM | POA: Diagnosis not present

## 2022-11-19 DIAGNOSIS — Z85038 Personal history of other malignant neoplasm of large intestine: Secondary | ICD-10-CM | POA: Diagnosis not present

## 2022-11-19 DIAGNOSIS — Z87891 Personal history of nicotine dependence: Secondary | ICD-10-CM | POA: Diagnosis not present

## 2022-11-19 DIAGNOSIS — J449 Chronic obstructive pulmonary disease, unspecified: Secondary | ICD-10-CM | POA: Diagnosis not present

## 2022-11-19 DIAGNOSIS — Z7982 Long term (current) use of aspirin: Secondary | ICD-10-CM | POA: Diagnosis not present

## 2022-11-19 DIAGNOSIS — N189 Chronic kidney disease, unspecified: Secondary | ICD-10-CM | POA: Diagnosis not present

## 2022-11-19 DIAGNOSIS — I69351 Hemiplegia and hemiparesis following cerebral infarction affecting right dominant side: Secondary | ICD-10-CM | POA: Diagnosis not present

## 2022-11-19 DIAGNOSIS — J4489 Other specified chronic obstructive pulmonary disease: Secondary | ICD-10-CM | POA: Diagnosis not present

## 2022-11-19 DIAGNOSIS — Z9981 Dependence on supplemental oxygen: Secondary | ICD-10-CM | POA: Diagnosis not present

## 2022-11-19 DIAGNOSIS — E1136 Type 2 diabetes mellitus with diabetic cataract: Secondary | ICD-10-CM | POA: Diagnosis not present

## 2022-11-19 DIAGNOSIS — E785 Hyperlipidemia, unspecified: Secondary | ICD-10-CM | POA: Diagnosis not present

## 2022-11-19 DIAGNOSIS — E1122 Type 2 diabetes mellitus with diabetic chronic kidney disease: Secondary | ICD-10-CM | POA: Diagnosis not present

## 2022-11-19 DIAGNOSIS — E1151 Type 2 diabetes mellitus with diabetic peripheral angiopathy without gangrene: Secondary | ICD-10-CM | POA: Diagnosis not present

## 2022-11-19 NOTE — Patient Instructions (Signed)
Visit Information  Thank you for taking time to visit with me today. Please don't hesitate to contact me if I can be of assistance to you.   Care Coordination provides support specific to your health needs that extend beyond exceptional routine office care you already receive from your primary care doctor.    If you are eligible for standard Care Coordination, there is no cost to you.  The Care Coordination team is made up of the following team members: Registered Nurse Care Guide: disease management, health education, care coordination and complex case management Clinical Social Work: Complex Care Coordination including coordination of level of care needs, mental and behavioral health assessment and recommendations, and connection to long-term mental health support Clinical Pharmacist: medication management, assistance and disease management Community Resource Care Guides: Forensic psychologist Team: dedicated team of scheduling professionals to support patient and clinical team scheduling needs  Please call [phone] if you would like to schedule a phone appointment with one of the team members.    The patient verbalized understanding of instructions, educational materials, and care plan provided today and DECLINED offer to receive copy of patient instructions, educational materials, and care plan.   No further follow up required: by Care Coordination at this time   Casimer Lanius, [place] [phone]

## 2022-11-19 NOTE — Telephone Encounter (Signed)
Patient has received nebulizer. Closing encounter

## 2022-11-19 NOTE — Telephone Encounter (Signed)
PT 2 weeks for 3 weeks 1 Week for 4 weeks  [facility] [phone]

## 2022-11-19 NOTE — Patient Outreach (Signed)
  Care Coordination  Initial Visit Note   11/19/2022 Name: BRANAE WEISCHEDEL MRN: [id number] DOB: April 11, 1941  JAMIRIA HANNOLD is a 82 y.o. year old female who sees [name], MD for primary care. I spoke with  Carollee Massed by phone today.  What matters to the patients health and wellness today?    Patient reports no concerns or needs from Care Coordination team with health and wellness related to physical or mental heath. .  She is working with [facility] , reports no needs with her blood pressure as it is well controlled, denies having diabetes and says her pulmonologist is managing her COPD.  SDOH assessments and interventions completed:  Yes  SDOH Interventions Today    Flowsheet Row Most Recent Value  SDOH Interventions   Food Insecurity Interventions Intervention Not Indicated  Housing Interventions Intervention Not Indicated  Transportation Interventions Payor Benefit  [reviewed payor benefits]  Utilities Interventions Intervention Not Indicated  Financial Strain Interventions Intervention Not Indicated       Care Coordination Interventions:  Yes, provided  Interventions Today    Flowsheet Row Most Recent Value  Chronic Disease   Chronic disease during today's visit Hypertension (HTN), Chronic Obstructive Pulmonary Disease (COPD)  General Interventions   General Interventions Discussed/Reviewed General Interventions Discussed  [Reviewed Care Coordination program]  Safety Interventions   Safety Discussed/Reviewed Safety Reviewed       Follow up plan: No further intervention required.   Encounter Outcome:  Pt. Visit Completed   Casimer Lanius, [place] [phone]

## 2022-11-20 NOTE — Telephone Encounter (Signed)
Tried calling PT no answer and VM is full. Will retry.Marland KitchenJohny Chess

## 2022-11-20 NOTE — Telephone Encounter (Signed)
Notified Rochelle w/ MD response.Marland KitchenJohny Chess

## 2022-11-20 NOTE — Telephone Encounter (Signed)
Pulmonary Dr. Loanne Drilling ordered home health and would sign off on this. We would defer to her.

## 2022-11-22 ENCOUNTER — Telehealth: Payer: Self-pay | Admitting: Pulmonary Disease

## 2022-11-22 NOTE — Telephone Encounter (Signed)
Physical Therapist Jeannene Patella calling [phone]    Calling to get verbal auth to provide 11 home health visits for this PT. Pls call to advise at # above. TY.

## 2022-11-22 NOTE — Telephone Encounter (Signed)
Dr. Loanne Drilling, please advise on the verbal request.

## 2022-11-22 NOTE — Telephone Encounter (Signed)
Called PT [place]. Verbal given for home health visit.

## 2022-11-26 DIAGNOSIS — J189 Pneumonia, unspecified organism: Secondary | ICD-10-CM | POA: Diagnosis not present

## 2022-11-26 DIAGNOSIS — J449 Chronic obstructive pulmonary disease, unspecified: Secondary | ICD-10-CM | POA: Diagnosis not present

## 2022-11-28 DIAGNOSIS — I129 Hypertensive chronic kidney disease with stage 1 through stage 4 chronic kidney disease, or unspecified chronic kidney disease: Secondary | ICD-10-CM | POA: Diagnosis not present

## 2022-11-28 DIAGNOSIS — Z7982 Long term (current) use of aspirin: Secondary | ICD-10-CM | POA: Diagnosis not present

## 2022-11-28 DIAGNOSIS — E785 Hyperlipidemia, unspecified: Secondary | ICD-10-CM | POA: Diagnosis not present

## 2022-11-28 DIAGNOSIS — E1122 Type 2 diabetes mellitus with diabetic chronic kidney disease: Secondary | ICD-10-CM | POA: Diagnosis not present

## 2022-11-28 DIAGNOSIS — Z9981 Dependence on supplemental oxygen: Secondary | ICD-10-CM | POA: Diagnosis not present

## 2022-11-28 DIAGNOSIS — Z85038 Personal history of other malignant neoplasm of large intestine: Secondary | ICD-10-CM | POA: Diagnosis not present

## 2022-11-28 DIAGNOSIS — J4489 Other specified chronic obstructive pulmonary disease: Secondary | ICD-10-CM | POA: Diagnosis not present

## 2022-11-28 DIAGNOSIS — I69351 Hemiplegia and hemiparesis following cerebral infarction affecting right dominant side: Secondary | ICD-10-CM | POA: Diagnosis not present

## 2022-11-28 DIAGNOSIS — E1136 Type 2 diabetes mellitus with diabetic cataract: Secondary | ICD-10-CM | POA: Diagnosis not present

## 2022-11-28 DIAGNOSIS — J439 Emphysema, unspecified: Secondary | ICD-10-CM | POA: Diagnosis not present

## 2022-11-28 DIAGNOSIS — Z87442 Personal history of urinary calculi: Secondary | ICD-10-CM | POA: Diagnosis not present

## 2022-11-28 DIAGNOSIS — E1151 Type 2 diabetes mellitus with diabetic peripheral angiopathy without gangrene: Secondary | ICD-10-CM | POA: Diagnosis not present

## 2022-11-28 DIAGNOSIS — Z87891 Personal history of nicotine dependence: Secondary | ICD-10-CM | POA: Diagnosis not present

## 2022-11-28 DIAGNOSIS — N189 Chronic kidney disease, unspecified: Secondary | ICD-10-CM | POA: Diagnosis not present

## 2022-11-30 DIAGNOSIS — I69351 Hemiplegia and hemiparesis following cerebral infarction affecting right dominant side: Secondary | ICD-10-CM | POA: Diagnosis not present

## 2022-11-30 DIAGNOSIS — N189 Chronic kidney disease, unspecified: Secondary | ICD-10-CM | POA: Diagnosis not present

## 2022-11-30 DIAGNOSIS — Z7982 Long term (current) use of aspirin: Secondary | ICD-10-CM | POA: Diagnosis not present

## 2022-11-30 DIAGNOSIS — Z9981 Dependence on supplemental oxygen: Secondary | ICD-10-CM | POA: Diagnosis not present

## 2022-11-30 DIAGNOSIS — E785 Hyperlipidemia, unspecified: Secondary | ICD-10-CM | POA: Diagnosis not present

## 2022-11-30 DIAGNOSIS — Z87891 Personal history of nicotine dependence: Secondary | ICD-10-CM | POA: Diagnosis not present

## 2022-11-30 DIAGNOSIS — Z87442 Personal history of urinary calculi: Secondary | ICD-10-CM | POA: Diagnosis not present

## 2022-11-30 DIAGNOSIS — J4489 Other specified chronic obstructive pulmonary disease: Secondary | ICD-10-CM | POA: Diagnosis not present

## 2022-11-30 DIAGNOSIS — E1122 Type 2 diabetes mellitus with diabetic chronic kidney disease: Secondary | ICD-10-CM | POA: Diagnosis not present

## 2022-11-30 DIAGNOSIS — I129 Hypertensive chronic kidney disease with stage 1 through stage 4 chronic kidney disease, or unspecified chronic kidney disease: Secondary | ICD-10-CM | POA: Diagnosis not present

## 2022-11-30 DIAGNOSIS — Z85038 Personal history of other malignant neoplasm of large intestine: Secondary | ICD-10-CM | POA: Diagnosis not present

## 2022-11-30 DIAGNOSIS — E1151 Type 2 diabetes mellitus with diabetic peripheral angiopathy without gangrene: Secondary | ICD-10-CM | POA: Diagnosis not present

## 2022-11-30 DIAGNOSIS — J439 Emphysema, unspecified: Secondary | ICD-10-CM | POA: Diagnosis not present

## 2022-11-30 DIAGNOSIS — E1136 Type 2 diabetes mellitus with diabetic cataract: Secondary | ICD-10-CM | POA: Diagnosis not present

## 2022-12-03 DIAGNOSIS — I129 Hypertensive chronic kidney disease with stage 1 through stage 4 chronic kidney disease, or unspecified chronic kidney disease: Secondary | ICD-10-CM | POA: Diagnosis not present

## 2022-12-03 DIAGNOSIS — Z9981 Dependence on supplemental oxygen: Secondary | ICD-10-CM | POA: Diagnosis not present

## 2022-12-03 DIAGNOSIS — I69351 Hemiplegia and hemiparesis following cerebral infarction affecting right dominant side: Secondary | ICD-10-CM | POA: Diagnosis not present

## 2022-12-03 DIAGNOSIS — N189 Chronic kidney disease, unspecified: Secondary | ICD-10-CM | POA: Diagnosis not present

## 2022-12-03 DIAGNOSIS — J439 Emphysema, unspecified: Secondary | ICD-10-CM | POA: Diagnosis not present

## 2022-12-03 DIAGNOSIS — Z85038 Personal history of other malignant neoplasm of large intestine: Secondary | ICD-10-CM | POA: Diagnosis not present

## 2022-12-03 DIAGNOSIS — E1151 Type 2 diabetes mellitus with diabetic peripheral angiopathy without gangrene: Secondary | ICD-10-CM | POA: Diagnosis not present

## 2022-12-03 DIAGNOSIS — E1136 Type 2 diabetes mellitus with diabetic cataract: Secondary | ICD-10-CM | POA: Diagnosis not present

## 2022-12-03 DIAGNOSIS — Z87891 Personal history of nicotine dependence: Secondary | ICD-10-CM | POA: Diagnosis not present

## 2022-12-03 DIAGNOSIS — Z87442 Personal history of urinary calculi: Secondary | ICD-10-CM | POA: Diagnosis not present

## 2022-12-03 DIAGNOSIS — Z7982 Long term (current) use of aspirin: Secondary | ICD-10-CM | POA: Diagnosis not present

## 2022-12-03 DIAGNOSIS — J4489 Other specified chronic obstructive pulmonary disease: Secondary | ICD-10-CM | POA: Diagnosis not present

## 2022-12-03 DIAGNOSIS — E785 Hyperlipidemia, unspecified: Secondary | ICD-10-CM | POA: Diagnosis not present

## 2022-12-03 DIAGNOSIS — E1122 Type 2 diabetes mellitus with diabetic chronic kidney disease: Secondary | ICD-10-CM | POA: Diagnosis not present

## 2022-12-04 ENCOUNTER — Telehealth: Payer: Self-pay

## 2022-12-04 NOTE — Telephone Encounter (Signed)
Caller: Patient  Concern: with symptoms of red areas of different sizes (from pinpoint to 1/2") on surface of legs and arms, these come and go and leave a scar, no open wounds, no warmth, no pain  Location: left leg, right leg  Description: gradual  Aggravating Factors: nothing  Treatments: none  Resolution: Instructed to call back if symptoms perist or worsen  Next Appt: Keep appointment scheduled on 01/07/23 per recall for 1 yr f/u

## 2022-12-05 DIAGNOSIS — J449 Chronic obstructive pulmonary disease, unspecified: Secondary | ICD-10-CM | POA: Diagnosis not present

## 2022-12-05 DIAGNOSIS — J189 Pneumonia, unspecified organism: Secondary | ICD-10-CM | POA: Diagnosis not present

## 2022-12-06 DIAGNOSIS — E785 Hyperlipidemia, unspecified: Secondary | ICD-10-CM | POA: Diagnosis not present

## 2022-12-06 DIAGNOSIS — Z87442 Personal history of urinary calculi: Secondary | ICD-10-CM | POA: Diagnosis not present

## 2022-12-06 DIAGNOSIS — E1122 Type 2 diabetes mellitus with diabetic chronic kidney disease: Secondary | ICD-10-CM | POA: Diagnosis not present

## 2022-12-06 DIAGNOSIS — N189 Chronic kidney disease, unspecified: Secondary | ICD-10-CM | POA: Diagnosis not present

## 2022-12-06 DIAGNOSIS — J439 Emphysema, unspecified: Secondary | ICD-10-CM | POA: Diagnosis not present

## 2022-12-06 DIAGNOSIS — I129 Hypertensive chronic kidney disease with stage 1 through stage 4 chronic kidney disease, or unspecified chronic kidney disease: Secondary | ICD-10-CM | POA: Diagnosis not present

## 2022-12-06 DIAGNOSIS — Z7982 Long term (current) use of aspirin: Secondary | ICD-10-CM | POA: Diagnosis not present

## 2022-12-06 DIAGNOSIS — Z85038 Personal history of other malignant neoplasm of large intestine: Secondary | ICD-10-CM | POA: Diagnosis not present

## 2022-12-06 DIAGNOSIS — Z87891 Personal history of nicotine dependence: Secondary | ICD-10-CM | POA: Diagnosis not present

## 2022-12-06 DIAGNOSIS — E1151 Type 2 diabetes mellitus with diabetic peripheral angiopathy without gangrene: Secondary | ICD-10-CM | POA: Diagnosis not present

## 2022-12-06 DIAGNOSIS — I69351 Hemiplegia and hemiparesis following cerebral infarction affecting right dominant side: Secondary | ICD-10-CM | POA: Diagnosis not present

## 2022-12-06 DIAGNOSIS — E1136 Type 2 diabetes mellitus with diabetic cataract: Secondary | ICD-10-CM | POA: Diagnosis not present

## 2022-12-06 DIAGNOSIS — J4489 Other specified chronic obstructive pulmonary disease: Secondary | ICD-10-CM | POA: Diagnosis not present

## 2022-12-06 DIAGNOSIS — Z9981 Dependence on supplemental oxygen: Secondary | ICD-10-CM | POA: Diagnosis not present

## 2022-12-11 DIAGNOSIS — Z87891 Personal history of nicotine dependence: Secondary | ICD-10-CM | POA: Diagnosis not present

## 2022-12-11 DIAGNOSIS — Z85038 Personal history of other malignant neoplasm of large intestine: Secondary | ICD-10-CM | POA: Diagnosis not present

## 2022-12-11 DIAGNOSIS — E1151 Type 2 diabetes mellitus with diabetic peripheral angiopathy without gangrene: Secondary | ICD-10-CM | POA: Diagnosis not present

## 2022-12-11 DIAGNOSIS — Z9981 Dependence on supplemental oxygen: Secondary | ICD-10-CM | POA: Diagnosis not present

## 2022-12-11 DIAGNOSIS — E785 Hyperlipidemia, unspecified: Secondary | ICD-10-CM | POA: Diagnosis not present

## 2022-12-11 DIAGNOSIS — Z87442 Personal history of urinary calculi: Secondary | ICD-10-CM | POA: Diagnosis not present

## 2022-12-11 DIAGNOSIS — J439 Emphysema, unspecified: Secondary | ICD-10-CM | POA: Diagnosis not present

## 2022-12-11 DIAGNOSIS — I129 Hypertensive chronic kidney disease with stage 1 through stage 4 chronic kidney disease, or unspecified chronic kidney disease: Secondary | ICD-10-CM | POA: Diagnosis not present

## 2022-12-11 DIAGNOSIS — I69351 Hemiplegia and hemiparesis following cerebral infarction affecting right dominant side: Secondary | ICD-10-CM | POA: Diagnosis not present

## 2022-12-11 DIAGNOSIS — Z7982 Long term (current) use of aspirin: Secondary | ICD-10-CM | POA: Diagnosis not present

## 2022-12-11 DIAGNOSIS — E1136 Type 2 diabetes mellitus with diabetic cataract: Secondary | ICD-10-CM | POA: Diagnosis not present

## 2022-12-11 DIAGNOSIS — E1122 Type 2 diabetes mellitus with diabetic chronic kidney disease: Secondary | ICD-10-CM | POA: Diagnosis not present

## 2022-12-11 DIAGNOSIS — N189 Chronic kidney disease, unspecified: Secondary | ICD-10-CM | POA: Diagnosis not present

## 2022-12-11 DIAGNOSIS — J4489 Other specified chronic obstructive pulmonary disease: Secondary | ICD-10-CM | POA: Diagnosis not present

## 2022-12-13 DIAGNOSIS — E1151 Type 2 diabetes mellitus with diabetic peripheral angiopathy without gangrene: Secondary | ICD-10-CM | POA: Diagnosis not present

## 2022-12-13 DIAGNOSIS — I129 Hypertensive chronic kidney disease with stage 1 through stage 4 chronic kidney disease, or unspecified chronic kidney disease: Secondary | ICD-10-CM | POA: Diagnosis not present

## 2022-12-13 DIAGNOSIS — Z87442 Personal history of urinary calculi: Secondary | ICD-10-CM | POA: Diagnosis not present

## 2022-12-13 DIAGNOSIS — E1122 Type 2 diabetes mellitus with diabetic chronic kidney disease: Secondary | ICD-10-CM | POA: Diagnosis not present

## 2022-12-13 DIAGNOSIS — Z87891 Personal history of nicotine dependence: Secondary | ICD-10-CM | POA: Diagnosis not present

## 2022-12-13 DIAGNOSIS — J439 Emphysema, unspecified: Secondary | ICD-10-CM | POA: Diagnosis not present

## 2022-12-13 DIAGNOSIS — I69351 Hemiplegia and hemiparesis following cerebral infarction affecting right dominant side: Secondary | ICD-10-CM | POA: Diagnosis not present

## 2022-12-13 DIAGNOSIS — Z9981 Dependence on supplemental oxygen: Secondary | ICD-10-CM | POA: Diagnosis not present

## 2022-12-13 DIAGNOSIS — E1136 Type 2 diabetes mellitus with diabetic cataract: Secondary | ICD-10-CM | POA: Diagnosis not present

## 2022-12-13 DIAGNOSIS — Z7982 Long term (current) use of aspirin: Secondary | ICD-10-CM | POA: Diagnosis not present

## 2022-12-13 DIAGNOSIS — Z85038 Personal history of other malignant neoplasm of large intestine: Secondary | ICD-10-CM | POA: Diagnosis not present

## 2022-12-13 DIAGNOSIS — E785 Hyperlipidemia, unspecified: Secondary | ICD-10-CM | POA: Diagnosis not present

## 2022-12-13 DIAGNOSIS — J4489 Other specified chronic obstructive pulmonary disease: Secondary | ICD-10-CM | POA: Diagnosis not present

## 2022-12-13 DIAGNOSIS — N189 Chronic kidney disease, unspecified: Secondary | ICD-10-CM | POA: Diagnosis not present

## 2022-12-19 DIAGNOSIS — E1136 Type 2 diabetes mellitus with diabetic cataract: Secondary | ICD-10-CM | POA: Diagnosis not present

## 2022-12-19 DIAGNOSIS — I129 Hypertensive chronic kidney disease with stage 1 through stage 4 chronic kidney disease, or unspecified chronic kidney disease: Secondary | ICD-10-CM | POA: Diagnosis not present

## 2022-12-19 DIAGNOSIS — J439 Emphysema, unspecified: Secondary | ICD-10-CM | POA: Diagnosis not present

## 2022-12-19 DIAGNOSIS — Z85038 Personal history of other malignant neoplasm of large intestine: Secondary | ICD-10-CM | POA: Diagnosis not present

## 2022-12-19 DIAGNOSIS — I69351 Hemiplegia and hemiparesis following cerebral infarction affecting right dominant side: Secondary | ICD-10-CM | POA: Diagnosis not present

## 2022-12-19 DIAGNOSIS — Z87891 Personal history of nicotine dependence: Secondary | ICD-10-CM | POA: Diagnosis not present

## 2022-12-19 DIAGNOSIS — E1122 Type 2 diabetes mellitus with diabetic chronic kidney disease: Secondary | ICD-10-CM | POA: Diagnosis not present

## 2022-12-19 DIAGNOSIS — E1151 Type 2 diabetes mellitus with diabetic peripheral angiopathy without gangrene: Secondary | ICD-10-CM | POA: Diagnosis not present

## 2022-12-19 DIAGNOSIS — Z9981 Dependence on supplemental oxygen: Secondary | ICD-10-CM | POA: Diagnosis not present

## 2022-12-19 DIAGNOSIS — E785 Hyperlipidemia, unspecified: Secondary | ICD-10-CM | POA: Diagnosis not present

## 2022-12-19 DIAGNOSIS — N189 Chronic kidney disease, unspecified: Secondary | ICD-10-CM | POA: Diagnosis not present

## 2022-12-19 DIAGNOSIS — J4489 Other specified chronic obstructive pulmonary disease: Secondary | ICD-10-CM | POA: Diagnosis not present

## 2022-12-19 DIAGNOSIS — Z7982 Long term (current) use of aspirin: Secondary | ICD-10-CM | POA: Diagnosis not present

## 2022-12-19 DIAGNOSIS — Z87442 Personal history of urinary calculi: Secondary | ICD-10-CM | POA: Diagnosis not present

## 2022-12-24 DIAGNOSIS — I129 Hypertensive chronic kidney disease with stage 1 through stage 4 chronic kidney disease, or unspecified chronic kidney disease: Secondary | ICD-10-CM | POA: Diagnosis not present

## 2022-12-24 DIAGNOSIS — E1122 Type 2 diabetes mellitus with diabetic chronic kidney disease: Secondary | ICD-10-CM | POA: Diagnosis not present

## 2022-12-24 DIAGNOSIS — Z7982 Long term (current) use of aspirin: Secondary | ICD-10-CM | POA: Diagnosis not present

## 2022-12-24 DIAGNOSIS — I69351 Hemiplegia and hemiparesis following cerebral infarction affecting right dominant side: Secondary | ICD-10-CM | POA: Diagnosis not present

## 2022-12-24 DIAGNOSIS — E785 Hyperlipidemia, unspecified: Secondary | ICD-10-CM | POA: Diagnosis not present

## 2022-12-24 DIAGNOSIS — N189 Chronic kidney disease, unspecified: Secondary | ICD-10-CM | POA: Diagnosis not present

## 2022-12-24 DIAGNOSIS — E1151 Type 2 diabetes mellitus with diabetic peripheral angiopathy without gangrene: Secondary | ICD-10-CM | POA: Diagnosis not present

## 2022-12-24 DIAGNOSIS — Z9981 Dependence on supplemental oxygen: Secondary | ICD-10-CM | POA: Diagnosis not present

## 2022-12-24 DIAGNOSIS — J439 Emphysema, unspecified: Secondary | ICD-10-CM | POA: Diagnosis not present

## 2022-12-24 DIAGNOSIS — E1136 Type 2 diabetes mellitus with diabetic cataract: Secondary | ICD-10-CM | POA: Diagnosis not present

## 2022-12-24 DIAGNOSIS — J4489 Other specified chronic obstructive pulmonary disease: Secondary | ICD-10-CM | POA: Diagnosis not present

## 2022-12-24 DIAGNOSIS — Z87891 Personal history of nicotine dependence: Secondary | ICD-10-CM | POA: Diagnosis not present

## 2022-12-24 DIAGNOSIS — Z87442 Personal history of urinary calculi: Secondary | ICD-10-CM | POA: Diagnosis not present

## 2022-12-24 DIAGNOSIS — Z85038 Personal history of other malignant neoplasm of large intestine: Secondary | ICD-10-CM | POA: Diagnosis not present

## 2022-12-27 DIAGNOSIS — J189 Pneumonia, unspecified organism: Secondary | ICD-10-CM | POA: Diagnosis not present

## 2022-12-27 DIAGNOSIS — J449 Chronic obstructive pulmonary disease, unspecified: Secondary | ICD-10-CM | POA: Diagnosis not present

## 2023-01-02 ENCOUNTER — Other Ambulatory Visit: Payer: Self-pay | Admitting: *Deleted

## 2023-01-02 DIAGNOSIS — I75023 Atheroembolism of bilateral lower extremities: Secondary | ICD-10-CM

## 2023-01-02 DIAGNOSIS — I878 Other specified disorders of veins: Secondary | ICD-10-CM

## 2023-01-04 ENCOUNTER — Telehealth: Payer: Self-pay | Admitting: Pulmonary Disease

## 2023-01-04 ENCOUNTER — Other Ambulatory Visit (HOSPITAL_COMMUNITY): Payer: Self-pay

## 2023-01-04 DIAGNOSIS — Z85038 Personal history of other malignant neoplasm of large intestine: Secondary | ICD-10-CM | POA: Diagnosis not present

## 2023-01-04 DIAGNOSIS — Z87442 Personal history of urinary calculi: Secondary | ICD-10-CM | POA: Diagnosis not present

## 2023-01-04 DIAGNOSIS — E1122 Type 2 diabetes mellitus with diabetic chronic kidney disease: Secondary | ICD-10-CM | POA: Diagnosis not present

## 2023-01-04 DIAGNOSIS — E785 Hyperlipidemia, unspecified: Secondary | ICD-10-CM | POA: Diagnosis not present

## 2023-01-04 DIAGNOSIS — Z7982 Long term (current) use of aspirin: Secondary | ICD-10-CM | POA: Diagnosis not present

## 2023-01-04 DIAGNOSIS — E1136 Type 2 diabetes mellitus with diabetic cataract: Secondary | ICD-10-CM | POA: Diagnosis not present

## 2023-01-04 DIAGNOSIS — N189 Chronic kidney disease, unspecified: Secondary | ICD-10-CM | POA: Diagnosis not present

## 2023-01-04 DIAGNOSIS — Z87891 Personal history of nicotine dependence: Secondary | ICD-10-CM | POA: Diagnosis not present

## 2023-01-04 DIAGNOSIS — J439 Emphysema, unspecified: Secondary | ICD-10-CM | POA: Diagnosis not present

## 2023-01-04 DIAGNOSIS — J449 Chronic obstructive pulmonary disease, unspecified: Secondary | ICD-10-CM | POA: Diagnosis not present

## 2023-01-04 DIAGNOSIS — J189 Pneumonia, unspecified organism: Secondary | ICD-10-CM | POA: Diagnosis not present

## 2023-01-04 DIAGNOSIS — I129 Hypertensive chronic kidney disease with stage 1 through stage 4 chronic kidney disease, or unspecified chronic kidney disease: Secondary | ICD-10-CM | POA: Diagnosis not present

## 2023-01-04 DIAGNOSIS — Z9981 Dependence on supplemental oxygen: Secondary | ICD-10-CM | POA: Diagnosis not present

## 2023-01-04 DIAGNOSIS — E1151 Type 2 diabetes mellitus with diabetic peripheral angiopathy without gangrene: Secondary | ICD-10-CM | POA: Diagnosis not present

## 2023-01-04 DIAGNOSIS — I69351 Hemiplegia and hemiparesis following cerebral infarction affecting right dominant side: Secondary | ICD-10-CM | POA: Diagnosis not present

## 2023-01-04 DIAGNOSIS — J4489 Other specified chronic obstructive pulmonary disease: Secondary | ICD-10-CM | POA: Diagnosis not present

## 2023-01-04 NOTE — Telephone Encounter (Signed)
On pharmacy price check generic Rosalyn Gess is confirmed to be >$100  Generic Duoneb q6h x 30 days = $3.61 Generic Pulmicort = $15.25  Called patient on home phone, no answer. Left HIPAA compliant message to return call to office. Was able to reach son approved on DPR and discussed the following plan:  Generic Duonebs twice a day Generic Pulmicort twice a day Reduce Brovana to once a day  If patient has any questions about regimen, message left to call back.

## 2023-01-04 NOTE — Telephone Encounter (Signed)
Pt called the office stating that she is not able to afford the budesonide nebulizer solution and wants to know if there is something else that might be able to be prescribed instead.  Pt said the budesonide is messing with her mouth and said that Dr. Everardo All was going to take her off of it due to it messing with her mouth but pt said she decided to stay on it.  Pt said that the newer solution that was prescribed for her by Dr. Everardo All to replace the budesonide was ipratropium-albuterol but she said she never got it due to it going to cost her over $100 which she was not going to be able to afford.   Is there anything else that pt might be able to take instead of the albuterol-ipratropium so she can get off of the budesonide as it is messing with her mouth. Dr. Everardo All, please advise.

## 2023-01-07 ENCOUNTER — Ambulatory Visit (HOSPITAL_COMMUNITY)
Admission: RE | Admit: 2023-01-07 | Discharge: 2023-01-07 | Disposition: A | Discharge status: Home / Self Care | Payer: Medicare HMO | Source: Ambulatory Visit | Attending: Surgery | Admitting: Surgery

## 2023-01-07 ENCOUNTER — Ambulatory Visit: Payer: Medicare HMO

## 2023-01-07 DIAGNOSIS — J439 Emphysema, unspecified: Secondary | ICD-10-CM | POA: Diagnosis not present

## 2023-01-07 DIAGNOSIS — E785 Hyperlipidemia, unspecified: Secondary | ICD-10-CM | POA: Diagnosis not present

## 2023-01-07 DIAGNOSIS — I69351 Hemiplegia and hemiparesis following cerebral infarction affecting right dominant side: Secondary | ICD-10-CM | POA: Diagnosis not present

## 2023-01-07 DIAGNOSIS — N189 Chronic kidney disease, unspecified: Secondary | ICD-10-CM | POA: Diagnosis not present

## 2023-01-07 DIAGNOSIS — I878 Other specified disorders of veins: Secondary | ICD-10-CM | POA: Diagnosis not present

## 2023-01-07 DIAGNOSIS — I75023 Atheroembolism of bilateral lower extremities: Secondary | ICD-10-CM | POA: Insufficient documentation

## 2023-01-07 DIAGNOSIS — Z87442 Personal history of urinary calculi: Secondary | ICD-10-CM | POA: Diagnosis not present

## 2023-01-07 DIAGNOSIS — E1151 Type 2 diabetes mellitus with diabetic peripheral angiopathy without gangrene: Secondary | ICD-10-CM | POA: Diagnosis not present

## 2023-01-07 DIAGNOSIS — Z85038 Personal history of other malignant neoplasm of large intestine: Secondary | ICD-10-CM | POA: Diagnosis not present

## 2023-01-07 DIAGNOSIS — J4489 Other specified chronic obstructive pulmonary disease: Secondary | ICD-10-CM | POA: Diagnosis not present

## 2023-01-07 DIAGNOSIS — I129 Hypertensive chronic kidney disease with stage 1 through stage 4 chronic kidney disease, or unspecified chronic kidney disease: Secondary | ICD-10-CM | POA: Diagnosis not present

## 2023-01-07 DIAGNOSIS — E1136 Type 2 diabetes mellitus with diabetic cataract: Secondary | ICD-10-CM | POA: Diagnosis not present

## 2023-01-07 DIAGNOSIS — E1122 Type 2 diabetes mellitus with diabetic chronic kidney disease: Secondary | ICD-10-CM | POA: Diagnosis not present

## 2023-01-07 DIAGNOSIS — Z9981 Dependence on supplemental oxygen: Secondary | ICD-10-CM | POA: Diagnosis not present

## 2023-01-07 DIAGNOSIS — Z7982 Long term (current) use of aspirin: Secondary | ICD-10-CM | POA: Diagnosis not present

## 2023-01-07 DIAGNOSIS — Z87891 Personal history of nicotine dependence: Secondary | ICD-10-CM | POA: Diagnosis not present

## 2023-01-07 LAB — VAS US ABI WITH/WO TBI
Left ABI: 1.09
Right ABI: 1.21

## 2023-01-08 ENCOUNTER — Telehealth: Payer: Self-pay | Admitting: Pulmonary Disease

## 2023-01-08 NOTE — Telephone Encounter (Signed)
Patient would like to know how to take her Nebulizer solutions. Patient phone number is [phone].

## 2023-01-09 ENCOUNTER — Other Ambulatory Visit: Payer: Self-pay | Admitting: Internal Medicine

## 2023-01-09 MED ORDER — NYSTATIN 100000 UNIT/ML MT SUSP: 5.0000 mL | Freq: Four times a day (QID) | OROMUCOSAL | 0 refills | Status: DC

## 2023-01-09 NOTE — Telephone Encounter (Signed)
Son reports patient has developed thrush with generic Pulmicort.  Nystatin ordered.  Change nebulizer regimen as noted below:  Generic Duonebs twice a day Generic Pulmicort once a day Brovana once a day

## 2023-01-14 DIAGNOSIS — E1122 Type 2 diabetes mellitus with diabetic chronic kidney disease: Secondary | ICD-10-CM | POA: Diagnosis not present

## 2023-01-14 DIAGNOSIS — I69351 Hemiplegia and hemiparesis following cerebral infarction affecting right dominant side: Secondary | ICD-10-CM | POA: Diagnosis not present

## 2023-01-14 DIAGNOSIS — E1136 Type 2 diabetes mellitus with diabetic cataract: Secondary | ICD-10-CM | POA: Diagnosis not present

## 2023-01-14 DIAGNOSIS — Z87891 Personal history of nicotine dependence: Secondary | ICD-10-CM | POA: Diagnosis not present

## 2023-01-14 DIAGNOSIS — J4489 Other specified chronic obstructive pulmonary disease: Secondary | ICD-10-CM | POA: Diagnosis not present

## 2023-01-14 DIAGNOSIS — N189 Chronic kidney disease, unspecified: Secondary | ICD-10-CM | POA: Diagnosis not present

## 2023-01-14 DIAGNOSIS — E785 Hyperlipidemia, unspecified: Secondary | ICD-10-CM | POA: Diagnosis not present

## 2023-01-14 DIAGNOSIS — E1151 Type 2 diabetes mellitus with diabetic peripheral angiopathy without gangrene: Secondary | ICD-10-CM | POA: Diagnosis not present

## 2023-01-14 DIAGNOSIS — Z7982 Long term (current) use of aspirin: Secondary | ICD-10-CM | POA: Diagnosis not present

## 2023-01-14 DIAGNOSIS — J439 Emphysema, unspecified: Secondary | ICD-10-CM | POA: Diagnosis not present

## 2023-01-14 DIAGNOSIS — I129 Hypertensive chronic kidney disease with stage 1 through stage 4 chronic kidney disease, or unspecified chronic kidney disease: Secondary | ICD-10-CM | POA: Diagnosis not present

## 2023-01-14 DIAGNOSIS — Z9981 Dependence on supplemental oxygen: Secondary | ICD-10-CM | POA: Diagnosis not present

## 2023-01-14 DIAGNOSIS — Z85038 Personal history of other malignant neoplasm of large intestine: Secondary | ICD-10-CM | POA: Diagnosis not present

## 2023-01-14 DIAGNOSIS — Z87442 Personal history of urinary calculi: Secondary | ICD-10-CM | POA: Diagnosis not present

## 2023-01-18 ENCOUNTER — Telehealth: Payer: Self-pay | Admitting: Pulmonary Disease

## 2023-01-18 NOTE — Telephone Encounter (Signed)
JE, please advise.  

## 2023-01-18 NOTE — Telephone Encounter (Signed)
Called and spoke to rochelle. Advised rochelle that Dr. Everardo All approved the physical therapy and occupational evaluation for patient. She verbalized understanding.   Nothing further needed.

## 2023-01-18 NOTE — Telephone Encounter (Signed)
Please give verbal to approve for additional PT sessions and OT evaluation

## 2023-01-21 ENCOUNTER — Other Ambulatory Visit: Payer: Self-pay | Admitting: Pulmonary Disease

## 2023-01-22 DIAGNOSIS — E1151 Type 2 diabetes mellitus with diabetic peripheral angiopathy without gangrene: Secondary | ICD-10-CM | POA: Diagnosis not present

## 2023-01-22 DIAGNOSIS — E785 Hyperlipidemia, unspecified: Secondary | ICD-10-CM | POA: Diagnosis not present

## 2023-01-22 DIAGNOSIS — J439 Emphysema, unspecified: Secondary | ICD-10-CM | POA: Diagnosis not present

## 2023-01-22 DIAGNOSIS — J4489 Other specified chronic obstructive pulmonary disease: Secondary | ICD-10-CM | POA: Diagnosis not present

## 2023-01-22 DIAGNOSIS — I69351 Hemiplegia and hemiparesis following cerebral infarction affecting right dominant side: Secondary | ICD-10-CM | POA: Diagnosis not present

## 2023-01-22 DIAGNOSIS — Z7982 Long term (current) use of aspirin: Secondary | ICD-10-CM | POA: Diagnosis not present

## 2023-01-22 DIAGNOSIS — I129 Hypertensive chronic kidney disease with stage 1 through stage 4 chronic kidney disease, or unspecified chronic kidney disease: Secondary | ICD-10-CM | POA: Diagnosis not present

## 2023-01-22 DIAGNOSIS — E1122 Type 2 diabetes mellitus with diabetic chronic kidney disease: Secondary | ICD-10-CM | POA: Diagnosis not present

## 2023-01-22 DIAGNOSIS — E1136 Type 2 diabetes mellitus with diabetic cataract: Secondary | ICD-10-CM | POA: Diagnosis not present

## 2023-01-22 DIAGNOSIS — N189 Chronic kidney disease, unspecified: Secondary | ICD-10-CM | POA: Diagnosis not present

## 2023-01-23 ENCOUNTER — Other Ambulatory Visit: Payer: Self-pay | Admitting: Pulmonary Disease

## 2023-01-23 ENCOUNTER — Telehealth: Payer: Self-pay | Admitting: Pulmonary Disease

## 2023-01-23 NOTE — Telephone Encounter (Signed)
Contacted Moira from [facility]. Gave verbal for occupational therapy once a week x 7 weeks

## 2023-01-23 NOTE — Telephone Encounter (Signed)
Moira from [facility] called and needs verbal orders for patient to do therapy once a week for seven weeks. Please advise- call back number is [phone]

## 2023-01-23 NOTE — Telephone Encounter (Signed)
Please advise 

## 2023-01-24 NOTE — Telephone Encounter (Signed)
Dr. Ellison, please advise on refill request. ?

## 2023-01-26 DIAGNOSIS — J449 Chronic obstructive pulmonary disease, unspecified: Secondary | ICD-10-CM | POA: Diagnosis not present

## 2023-01-26 DIAGNOSIS — J189 Pneumonia, unspecified organism: Secondary | ICD-10-CM | POA: Diagnosis not present

## 2023-01-29 ENCOUNTER — Telehealth: Payer: Self-pay | Admitting: Pulmonary Disease

## 2023-01-29 MED ORDER — AZITHROMYCIN 250 MG PO TABS: ORAL_TABLET | ORAL | 0 refills | Status: DC

## 2023-01-29 MED ORDER — PREDNISONE 10 MG PO TABS: ORAL_TABLET | ORAL | 0 refills | Status: AC

## 2023-01-29 NOTE — Telephone Encounter (Signed)
Called and spoke with patient. She verbalized understanding. RX has been sent to pharmacy.  ° °Nothing further needed at time of call.  °

## 2023-01-29 NOTE — Telephone Encounter (Signed)
Called and spoke with patient. She stated that she woke up this morning and had a productive cough with thick green phlegm. She first noticed some increased chest tightness and wheezing yesterday. Denied any fevers or body aches. She used her budesonide solution this morning and has not noticed any relief. Denied being around anyone who has been sick recently.   She wanted to know if something could be sent in for her.   Pharmacy is [facility] in [place].   MW, can you please advise Dr. Everardo All is not available today? Thanks!

## 2023-01-29 NOTE — Telephone Encounter (Signed)
Pt states she been coughing up green mucus, Pt having tightness in her chest and also having  trouble breathing. Offered pt to come in and she mentioned she has no form of transportation

## 2023-01-29 NOTE — Telephone Encounter (Signed)
Zpak/ Prednisone 10 mg take  4 each am x 2 days,   2 each am x 2 days,  1 each am x 2 days and stop  

## 2023-02-04 ENCOUNTER — Ambulatory Visit: Payer: Medicare HMO

## 2023-02-04 DIAGNOSIS — J189 Pneumonia, unspecified organism: Secondary | ICD-10-CM | POA: Diagnosis not present

## 2023-02-04 DIAGNOSIS — J449 Chronic obstructive pulmonary disease, unspecified: Secondary | ICD-10-CM | POA: Diagnosis not present

## 2023-02-05 ENCOUNTER — Other Ambulatory Visit: Payer: Self-pay | Admitting: Internal Medicine

## 2023-02-05 DIAGNOSIS — E1151 Type 2 diabetes mellitus with diabetic peripheral angiopathy without gangrene: Secondary | ICD-10-CM | POA: Diagnosis not present

## 2023-02-05 DIAGNOSIS — J4489 Other specified chronic obstructive pulmonary disease: Secondary | ICD-10-CM | POA: Diagnosis not present

## 2023-02-05 DIAGNOSIS — E1122 Type 2 diabetes mellitus with diabetic chronic kidney disease: Secondary | ICD-10-CM | POA: Diagnosis not present

## 2023-02-05 DIAGNOSIS — E1136 Type 2 diabetes mellitus with diabetic cataract: Secondary | ICD-10-CM | POA: Diagnosis not present

## 2023-02-05 DIAGNOSIS — N189 Chronic kidney disease, unspecified: Secondary | ICD-10-CM | POA: Diagnosis not present

## 2023-02-05 DIAGNOSIS — Z7982 Long term (current) use of aspirin: Secondary | ICD-10-CM | POA: Diagnosis not present

## 2023-02-05 DIAGNOSIS — I69351 Hemiplegia and hemiparesis following cerebral infarction affecting right dominant side: Secondary | ICD-10-CM | POA: Diagnosis not present

## 2023-02-05 DIAGNOSIS — J439 Emphysema, unspecified: Secondary | ICD-10-CM | POA: Diagnosis not present

## 2023-02-05 DIAGNOSIS — E785 Hyperlipidemia, unspecified: Secondary | ICD-10-CM | POA: Diagnosis not present

## 2023-02-05 DIAGNOSIS — I129 Hypertensive chronic kidney disease with stage 1 through stage 4 chronic kidney disease, or unspecified chronic kidney disease: Secondary | ICD-10-CM | POA: Diagnosis not present

## 2023-02-08 DIAGNOSIS — E1136 Type 2 diabetes mellitus with diabetic cataract: Secondary | ICD-10-CM | POA: Diagnosis not present

## 2023-02-08 DIAGNOSIS — I69351 Hemiplegia and hemiparesis following cerebral infarction affecting right dominant side: Secondary | ICD-10-CM | POA: Diagnosis not present

## 2023-02-08 DIAGNOSIS — J4489 Other specified chronic obstructive pulmonary disease: Secondary | ICD-10-CM | POA: Diagnosis not present

## 2023-02-08 DIAGNOSIS — E1151 Type 2 diabetes mellitus with diabetic peripheral angiopathy without gangrene: Secondary | ICD-10-CM | POA: Diagnosis not present

## 2023-02-08 DIAGNOSIS — E785 Hyperlipidemia, unspecified: Secondary | ICD-10-CM | POA: Diagnosis not present

## 2023-02-08 DIAGNOSIS — J439 Emphysema, unspecified: Secondary | ICD-10-CM | POA: Diagnosis not present

## 2023-02-08 DIAGNOSIS — E1122 Type 2 diabetes mellitus with diabetic chronic kidney disease: Secondary | ICD-10-CM | POA: Diagnosis not present

## 2023-02-08 DIAGNOSIS — N189 Chronic kidney disease, unspecified: Secondary | ICD-10-CM | POA: Diagnosis not present

## 2023-02-08 DIAGNOSIS — I129 Hypertensive chronic kidney disease with stage 1 through stage 4 chronic kidney disease, or unspecified chronic kidney disease: Secondary | ICD-10-CM | POA: Diagnosis not present

## 2023-02-08 DIAGNOSIS — Z7982 Long term (current) use of aspirin: Secondary | ICD-10-CM | POA: Diagnosis not present

## 2023-02-11 ENCOUNTER — Encounter (HOSPITAL_BASED_OUTPATIENT_CLINIC_OR_DEPARTMENT_OTHER): Payer: Self-pay | Admitting: Pulmonary Disease

## 2023-02-11 ENCOUNTER — Ambulatory Visit (HOSPITAL_BASED_OUTPATIENT_CLINIC_OR_DEPARTMENT_OTHER): Payer: Medicare HMO | Admitting: Pulmonary Disease

## 2023-02-11 VITALS — BP 134/60 | HR 90 | Temp 98.3°F | Ht 59.0 in | Wt 120.0 lb

## 2023-02-11 DIAGNOSIS — J441 Chronic obstructive pulmonary disease with (acute) exacerbation: Secondary | ICD-10-CM | POA: Diagnosis not present

## 2023-02-11 DIAGNOSIS — G4734 Idiopathic sleep related nonobstructive alveolar hypoventilation: Secondary | ICD-10-CM

## 2023-02-11 MED ORDER — PREDNISONE 10 MG PO TABS: ORAL_TABLET | ORAL | 0 refills | Status: AC

## 2023-02-11 MED ORDER — NYSTATIN 100000 UNIT/ML MT SUSP: 5.0000 mL | Freq: Four times a day (QID) | OROMUCOSAL | 0 refills | Status: DC

## 2023-02-11 NOTE — Patient Instructions (Addendum)
COPD exacerbation --Prednisone taper --CONTINUE nebulizer regimen as follows:  --Pulmicort/Budesonide ONE neb in the morning  --Brovana/aformoterol ONE neb in the morning and evening  --Duoneb/ipratropium-albuterol ONE neb in the morning   Nocturnal hypoxemia --CONTINUE 1L O2 nightly for goal >88%

## 2023-02-11 NOTE — Progress Notes (Signed)
Synopsis: Referred in 2017 for COPD by Myrlene Broker, *  Subjective:   PATIENT ID: Sherry Lambert GENDER: female DOB: [birth year], MRN: [id number]  Chief Complaint  Patient presents with   Follow-up    Follow up. Patient states she is having trouble breathing.   Sherry Lambert is a 82 year old female former smoker with COPD, hx CVA in 2016 who presents for follow-up.   Synopsis: She is a former patient of Dr. Chestine Spore. She was diagnosed with COPD in 2016. Her last COPD exacerbation was one year ago Feb 2021 requiring hospitalization. She transitioned from Trelegy to [facility] in March 2022 due to thrush and reports symptoms remain well-controlled.  12/20/20 Since our last visit, she reports her symptoms are well contolled on Anoro. Sherry Lambert has resolved off inhaled corticosteroids. After sitting three hours outside, she used her Albuterol yesterday for shortness of breath and nasal congestion and believes this is related to the pollen. Otherwise she does routinely use her rescue inhaler. Denies wheezing or cough.  10/10/21 Since our last visit she is compliant with Anoro. She is ambulatory with a walker at home. In the last two weeks she has had worsening shortness of breath and has had to use albuterol once a day. No coughing or wheezing. No fevers or chills. Her last exacerbation was in 05/2021 which was treated with doxycycline. She will do 30 min exercise twice a week with a TV program and the exercises will vary aerobic to strengthening.  02/26/22 Since our last visit she was hospitalized in June for pneumonia. No evidence of aspiration. Improved on antibiotics. [name] with exertion. No wheezing or cough.  08/16/22 Family is present and provides additional history. In the last several months she has had multiple exacerbations including in September, October and November (Augment/Doxy for pneumonia). Duonebs were added to her regimen. At the end of October was started on  chronic prednisone 5 mg daily by Dr. Tonia Brooms. She continues to have shortness of breath with minimal exertion. Denies cough or wheezing. She is compliant with her meds but does not feel it is effective and not able to inhale properly. Since her stroke she is weak and feels her breathing has declined with her strength.  11/12/22 Present with son who provides additional history. Since our last visit she has called the office to report the combo nebs are not effective and was seen in the ED on 10/29/22. Discharged with doxycycline, no new O2 requirements. Called office after hours on 11/04/22 and prescribed prednisone 40 mg x 5 days. Compliant with her nebulizer regimen but does feel that the steroid nebulizer causes a filmy taste. Son reports she is overall improved on this regimen with only shortness of breath on exertion. No cough or wheezing. Compliant with nocturnal oxygen.   02/11/23 She was doing well with her current nebulizer regimen. No further thrush after reducing Pulmicort to daily. However unable to complete nystatin due to individual packages - needing to cut with knife. In the last two days she is having shortness of breath. Denies wheezing or cough. Denies fevers, chills. Compliant with oxygen. Discussed goals of care and expresses she would want to be DNR. Family present in clinic today.  Prior inhalers: Trelegy - thrush  Social history: Quit in 2004. 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   [name]    Cataract    Chronic kidney disease    kidney  stone   Colitis    Colon cancer (HCC) 06/2017   cecum   Diabetes mellitus without complication (HCC)    no meds taken   Emphysema of lung (HCC)    GERD (gastroesophageal reflux disease)    Hyperlipidemia    Hypertension    Peripheral arterial disease (HCC)    [name]    Stroke [facility])    November 2016- Thanksgiving night     Allergies  Allergen Reactions    Lipitor [Atorvastatin] Itching     Outpatient Medications Prior to Visit  Medication Sig Dispense Refill   acetaminophen (TYLENOL) 500 MG tablet Take 500 mg by mouth daily.      albuterol (VENTOLIN HFA) 108 (90 Base) MCG/ACT inhaler Inhale 1-2 puffs into the lungs every 4 (four) hours as needed for wheezing or shortness of breath. 18 g 6   arformoterol (BROVANA) 15 MCG/2ML NEBU Take 2 mLs (15 mcg total) by nebulization in the morning and at bedtime. 120 mL 5   aspirin EC 81 MG tablet Take 1 tablet (81 mg total) by mouth daily. 90 tablet 3   azithromycin (ZITHROMAX) 250 MG tablet Take 2 tablets on first day, then 1 tablet once daily until finished. 6 tablet 0   budesonide (PULMICORT) 0.5 MG/2ML nebulizer solution Take 2 mLs (0.5 mg total) by nebulization in the morning and at bedtime. 120 mL 5   gabapentin (NEURONTIN) 300 MG capsule Take 2 capsules by mouth twice daily 360 capsule 0   ipratropium-albuterol (DUONEB) 0.5-2.5 (3) MG/3ML SOLN Take 3 mLs by nebulization every 6 (six) hours as needed. 360 mL 2   nystatin (MYCOSTATIN) 100000 UNIT/ML suspension TAKE  5 ML BY MOUTH 4 TIMES DAILY 120 mL 0   omeprazole (PRILOSEC) 40 MG capsule Take 1 capsule by mouth once daily 90 capsule 0   Polyvinyl Alcohol-Povidone (REFRESH OP) Place 1 drop into both eyes daily as needed (For dry eyes).     pravastatin (PRAVACHOL) 20 MG tablet Take 1 tablet by mouth once daily 90 tablet 3   traMADol (ULTRAM) 50 MG tablet TAKE 1 TABLET BY MOUTH ONCE DAILY AS NEEDED 30 tablet 5   VITAMIN D PO Take 1 tablet by mouth daily.     No facility-administered medications prior to visit.    Review of Systems  Constitutional:  Negative for chills, diaphoresis, fever, malaise/fatigue and weight loss.  HENT:  Negative for congestion.   Respiratory:  Positive for shortness of breath. Negative for cough, hemoptysis, sputum production and wheezing.   Cardiovascular:  Negative for chest pain, palpitations and leg swelling.    Objective:   Vitals:   02/11/23 1417  BP: 134/60  Pulse: 90  Temp: 98.3 F (36.8 C)  TempSrc: Oral  SpO2: 94%  Weight: 120 lb (54.4 kg)  Height: 4\' 11"  (1.499 m)   94%  BMI Readings from Last 3 Encounters:  02/11/23 24.24 kg/m  11/12/22 21.41 kg/m  10/29/22 22.84 kg/m   Wt Readings from Last 3 Encounters:  02/11/23 120 lb (54.4 kg)  11/12/22 106 lb (48.1 kg)  10/29/22 113 lb 1.5 oz (51.3 kg)   Physical Exam: General: Frail and chronically ill-appearing, no acute distress HENT: [place], AT Eyes: EOMI, no scleral icterus Respiratory: Diminished but clear to auscultation bilaterally.  No crackles, wheezing or rales Cardiovascular: RRR, -M/R/G, no JVD Extremities:-Edema,-tenderness Neuro: AAO x4, CNII-XII grossly intact Psych: Normal mood, normal affect  Chest Imaging- films reviewed: CTA Chest 10/27/2019-emphysema, bilateral dependent pleural effusions.  No PE,  no significant mediastinal or hilar adenopathy.  Hernia with stomach and abdominal fat contents protruding into the mediastinum. CXR 05/22/21 - COPD. No infiltrate effusion or edema CXR 02/26/22 - Resolution of lower lobe infiltrates  Pulmonary Functions Testing Results:    Latest Ref Rng & Units 11/01/2020    3:05 PM 02/13/2016    1:04 PM  PFT Results  FVC-Pre L 1.82  1.82   FVC-Predicted Pre % 82  76   FVC-Post L 1.80  2.02   FVC-Predicted Post % 81  84   Pre FEV1/FVC % % 48  45   Post FEV1/FCV % % 46  47   FEV1-Pre L 0.87  0.82   FEV1-Predicted Pre % 52  46   FEV1-Post L 0.83  0.94   DLCO uncorrected ml/min/mmHg 9.89  9.75   DLCO UNC% % 60  51   DLCO corrected ml/min/mmHg 9.89  9.26   DLCO COR %Predicted % 60  49   DLVA Predicted % 80  64   TLC L 5.04    TLC % Predicted % 113    RV % Predicted % 159     2017- severe obstruction with out significant bronchodilator reversibility.  Moderate diffusion impairment.  Flow volume loop supports obstruction.  2019 spirometry: FVC 1.8 (75%) FEV1 0.7  (39%) Ratio 38%  PFT 11/01/20 FVC 1.8 (81%) FEV1 0.83 (50%) Ratio 48  TLC 113% RV 159% DLCO 60% Interpretation: Moderately severe obstructive defect with air trapping and reduced DLCO consistent with emphysema.  ONO study 03/02/22 - SpO2 <88% 15 min 44 seconds. 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 Multiple exacerbations including in September, October and November 2023 and Feb and March 2024. Currently in exacerbation. Discussed clinical course and management of COPD including bronchodilator regimen, preventive care and action plan for exacerbation.  COPD with emphysema COPD exacerbation --Prednisone taper --CONTINUE nebulizer regimen as follows:  --Pulmicort/Budesonide ONE neb in the morning  --Brovana/aformoterol ONE neb in the morning and evening  --Duoneb/ipratropium-albuterol ONE neb in the morning   --CONTINUE Albuterol as needed for shortness of breath or wheezing --Not a candidate for pulmonary rehab right now. Will refer to home health PT --Discontinue chronic prednisone therapy due to ineffectiveness  Nocturnal hypoxemia --CONTINUE 1L O2 nightly for goal >88%  GOC --Palliative care offered --Discussed considering what it means to need mechanical ventilation or CPR --Encouraged to continue discussions with family.  Health Maintenance Immunization History  Administered Date(s) Administered   Fluad Quad(high Dose 65+) 06/18/2019, 07/13/2020, 07/19/2021, 07/02/2022   Influenza, High Dose Seasonal PF 07/27/2013, 05/14/2016, 05/20/2017, 06/05/2018   Influenza,inj,Quad PF,6+ Mos 08/03/2014, 06/21/2015   Influenza,inj,quad, With Preservative 06/04/2019   Influenza-Unspecified 07/04/2012, 06/18/2022   PFIZER(Purple Top)SARS-COV-2 Vaccination 11/17/2019, 12/08/2019, 08/17/2020   Pneumococcal Conjugate-13 11/11/2015   Pneumococcal-Unspecified 09/03/2010   Td 09/03/2012   CT Lung Screening - not a  candidate. Quit smoking > 15 years ago  I have spent a total time of 35-minutes on the day of the appointment including chart review, data review, collecting history, coordinating care and discussing medical diagnosis and plan with the patient/family. Past medical history, allergies, medications were reviewed. Pertinent imaging, labs and tests included in this note have been reviewed and interpreted independently by me.  Alechia Lezama Mechele Collin, MD [facility] 02/11/2023 2:25 PM

## 2023-02-13 ENCOUNTER — Ambulatory Visit: Payer: Medicare HMO | Admitting: Internal Medicine

## 2023-02-14 DIAGNOSIS — E1122 Type 2 diabetes mellitus with diabetic chronic kidney disease: Secondary | ICD-10-CM | POA: Diagnosis not present

## 2023-02-14 DIAGNOSIS — J4489 Other specified chronic obstructive pulmonary disease: Secondary | ICD-10-CM | POA: Diagnosis not present

## 2023-02-14 DIAGNOSIS — J439 Emphysema, unspecified: Secondary | ICD-10-CM | POA: Diagnosis not present

## 2023-02-14 DIAGNOSIS — E1136 Type 2 diabetes mellitus with diabetic cataract: Secondary | ICD-10-CM | POA: Diagnosis not present

## 2023-02-14 DIAGNOSIS — I129 Hypertensive chronic kidney disease with stage 1 through stage 4 chronic kidney disease, or unspecified chronic kidney disease: Secondary | ICD-10-CM | POA: Diagnosis not present

## 2023-02-14 DIAGNOSIS — E785 Hyperlipidemia, unspecified: Secondary | ICD-10-CM | POA: Diagnosis not present

## 2023-02-14 DIAGNOSIS — E1151 Type 2 diabetes mellitus with diabetic peripheral angiopathy without gangrene: Secondary | ICD-10-CM | POA: Diagnosis not present

## 2023-02-14 DIAGNOSIS — Z7982 Long term (current) use of aspirin: Secondary | ICD-10-CM | POA: Diagnosis not present

## 2023-02-14 DIAGNOSIS — N189 Chronic kidney disease, unspecified: Secondary | ICD-10-CM | POA: Diagnosis not present

## 2023-02-14 DIAGNOSIS — I69351 Hemiplegia and hemiparesis following cerebral infarction affecting right dominant side: Secondary | ICD-10-CM | POA: Diagnosis not present

## 2023-02-15 ENCOUNTER — Ambulatory Visit: Payer: Medicare HMO | Admitting: Internal Medicine

## 2023-02-18 ENCOUNTER — Ambulatory Visit: Payer: Medicare HMO | Admitting: Physician Assistant

## 2023-02-18 VITALS — BP 127/62 | HR 81 | Temp 97.3°F | Resp 16 | Ht 59.0 in | Wt 116.0 lb

## 2023-02-18 DIAGNOSIS — I878 Other specified disorders of veins: Secondary | ICD-10-CM | POA: Diagnosis not present

## 2023-02-18 DIAGNOSIS — I739 Peripheral vascular disease, unspecified: Secondary | ICD-10-CM

## 2023-02-18 DIAGNOSIS — D692 Other nonthrombocytopenic purpura: Secondary | ICD-10-CM | POA: Diagnosis not present

## 2023-02-18 NOTE — Progress Notes (Unsigned)
Office Note     CC:  follow up Requesting Provider:  Myrlene Broker, *  HPI: Sherry Lambert is a 82 y.o. (1941/07/30) female who presents for follow up of peripheral artery disease. She also has venous hypertension. She does wear knee-high compression daily and elevates her legs daily. She has been without any rest pain, pain on ambulation or tissue loss. She is ambulatory with a walker.  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    Today she presents with her son. She is most concerned about the bruising she has on her legs and arms.  She does not do much walking aside from inside her house. She generally uses a walker. She still has some right sided weakness from her stroke and also she is limited but arthritis  and " bone on bone" in her right knee.  She is a former smoker.  She is medically managed on aspirin and statin.      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   [name]    Cataract    Chronic kidney disease    kidney stone   Colitis    Colon cancer (HCC) 06/2017   cecum   Diabetes mellitus without complication (HCC)    no meds taken   Emphysema of lung (HCC)    GERD (gastroesophageal reflux disease)    Hyperlipidemia    Hypertension    Peripheral arterial disease (HCC)    [name]    Stroke [facility])    November 2016- Thanksgiving night    Past Surgical History:  Procedure Laterality Date   ABDOMINAL HYSTERECTOMY     BALLOON DILATION N/A 12/23/2012   Procedure: BALLOON DILATION;  Surgeon: Louis Meckel, MD;  Location: [facility] ENDOSCOPY;  Service: Endoscopy;  Laterality: N/A;   BALLOON DILATION N/A 01/15/2013   Procedure: BALLOON DILATION;  Surgeon: Louis Meckel, MD;  Location: [facility];  Service: Endoscopy;  Laterality: N/A;   BALLOON DILATION N/A 07/22/2018   Procedure: BALLOON DILATION;  Surgeon: Napoleon Form, MD;  Location: [facility] ENDOSCOPY;  Service: Endoscopy;   Laterality: N/A;   cataract     both eyes   COLONOSCOPY     COLONOSCOPY WITH PROPOFOL N/A 03/05/2017   Procedure: COLONOSCOPY WITH PROPOFOL;  Surgeon: Napoleon Form, MD;  Location: [facility]OS[facility];  Service: Endoscopy;  Laterality: N/A;   DENTAL RESTORATION/EXTRACTION WITH X-RAY     ESOPHAGOGASTRODUODENOSCOPY N/A 12/23/2012   Procedure: ESOPHAGOGASTRODUODENOSCOPY (EGD);  Surgeon: Louis Meckel, MD;  Location: [facility];  Service: Endoscopy;  Laterality: N/A;   ESOPHAGOGASTRODUODENOSCOPY N/A 01/15/2013   Procedure: ESOPHAGOGASTRODUODENOSCOPY (EGD);  Surgeon: Louis Meckel, MD;  Location: [facility];  Service: Endoscopy;  Laterality: N/A;   ESOPHAGOGASTRODUODENOSCOPY (EGD) WITH PROPOFOL N/A 07/22/2018   Procedure: ESOPHAGOGASTRODUODENOSCOPY (EGD) WITH PROPOFOL;  Surgeon: Napoleon Form, MD;  Location: [facility];  Service: Endoscopy;  Laterality: N/A;   TONSILLECTOMY      Social History   Socioeconomic History   Marital status: Divorced    Spouse name: Not on file   Number of children: 1   Years of education: Not on file   Highest education level: Not on file  Occupational History   Occupation: Retired    Comment: hosier [employer]  Tobacco Use   Smoking status: Former    Packs/day: 1.00    Years: 55.00    Additional pack years: 0.00    Total  pack years: 55.00    Types: Cigarettes    Quit date: 10/19/2002    Years since quitting: 20.3   Smokeless tobacco: Never  Vaping Use   Vaping Use: Never used  Substance and Sexual Activity   Alcohol use: No    Alcohol/week: 0.0 standard drinks of alcohol   Drug use: No   Sexual activity: Never    Birth control/protection: Surgical  Other Topics Concern   Not on file  Social History Narrative   Regular exercise-no   Caffeine Use-yes   Social Determinants of Health   Financial Resource Strain: Low Risk  (11/19/2022)   Overall Financial Resource Strain (CARDIA)    Difficulty of Paying Living Expenses: Not very hard  Food  Insecurity: No Food Insecurity (11/19/2022)   Hunger Vital Sign    Worried About Running Out of Food in the Last Year: Never true    Ran Out of Food in the Last Year: Never true  Transportation Needs: No Transportation Needs (11/19/2022)   PRAPARE - Administrator, Civil Service (Medical): No    Lack of Transportation (Non-Medical): No  Physical Activity: Insufficiently Active (08/15/2022)   Exercise Vital Sign    Days of Exercise per Week: 7 days    Minutes of Exercise per Session: 20 min  Stress: No Stress Concern Present (08/15/2022)   [employer] of Occupational Health - Occupational Stress Questionnaire    Feeling of Stress : Not at all  Social Connections: Moderately Integrated (08/15/2022)   Social Connection and Isolation Panel [NHANES]    Frequency of Communication with Friends and Family: More than three times a week    Frequency of Social Gatherings with Friends and Family: More than three times a week    Attends Religious Services: 1 to 4 times per year    Active Member of [employer] or Organizations: Yes    Attends Banker Meetings: Never    Marital Status: Divorced  Catering manager Violence: Not At Risk (08/15/2022)   Humiliation, Afraid, Rape, and Kick questionnaire    Fear of Current or Ex-Partner: No    Emotionally Abused: No    Physically Abused: No    Sexually Abused: No    Family History  Problem Relation Age of Onset   Hypertension Father    Diabetes Father    Cancer Father        melanoma   Emphysema Mother    Colon cancer Sister 92   Cancer Sister        colon, surgery alone    Emphysema Sister    Early death Neg Hx    Stroke Neg Hx    Esophageal cancer Neg Hx    Rectal cancer Neg Hx    Stomach cancer Neg Hx     Current Outpatient Medications  Medication Sig Dispense Refill   acetaminophen (TYLENOL) 500 MG tablet Take 500 mg by mouth daily.      albuterol (VENTOLIN HFA) 108 (90 Base) MCG/ACT inhaler Inhale 1-2 puffs  into the lungs every 4 (four) hours as needed for wheezing or shortness of breath. 18 g 6   arformoterol (BROVANA) 15 MCG/2ML NEBU Take 2 mLs (15 mcg total) by nebulization in the morning and at bedtime. 120 mL 5   aspirin EC 81 MG tablet Take 1 tablet (81 mg total) by mouth daily. 90 tablet 3   azithromycin (ZITHROMAX) 250 MG tablet Take 2 tablets on first day, then 1 tablet once daily until finished. 6  tablet 0   budesonide (PULMICORT) 0.5 MG/2ML nebulizer solution Take 2 mLs (0.5 mg total) by nebulization in the morning and at bedtime. 120 mL 5   gabapentin (NEURONTIN) 300 MG capsule Take 2 capsules by mouth twice daily 360 capsule 0   ipratropium-albuterol (DUONEB) 0.5-2.5 (3) MG/3ML SOLN Take 3 mLs by nebulization every 6 (six) hours as needed. 360 mL 2   nystatin (MYCOSTATIN) 100000 UNIT/ML suspension TAKE  5 ML BY MOUTH 4 TIMES DAILY 120 mL 0   nystatin (MYCOSTATIN) 100000 UNIT/ML suspension Take 5 mLs (500,000 Units total) by mouth 4 (four) times daily. 120 mL 0   omeprazole (PRILOSEC) 40 MG capsule Take 1 capsule by mouth once daily 90 capsule 0   Polyvinyl Alcohol-Povidone (REFRESH OP) Place 1 drop into both eyes daily as needed (For dry eyes).     pravastatin (PRAVACHOL) 20 MG tablet Take 1 tablet by mouth once daily 90 tablet 3   predniSONE (DELTASONE) 10 MG tablet Take 4 tablets (40 mg total) by mouth daily with breakfast for 2 days, THEN 3 tablets (30 mg total) daily with breakfast for 2 days, THEN 2 tablets (20 mg total) daily with breakfast for 2 days, THEN 1 tablet (10 mg total) daily with breakfast for 2 days. 20 tablet 0   traMADol (ULTRAM) 50 MG tablet TAKE 1 TABLET BY MOUTH ONCE DAILY AS NEEDED 30 tablet 5   VITAMIN D PO Take 1 tablet by mouth daily.     No current facility-administered medications for this visit.    Allergies  Allergen Reactions   Lipitor [Atorvastatin] Itching     REVIEW OF SYSTEMS:  [X]  denotes positive finding, [ ]  denotes negative finding Cardiac   Comments:  Chest pain or chest pressure:    [name] upon exertion:    Short of breath when lying flat:    Irregular heart rhythm:        Vascular    Pain in calf, thigh, or hip brought on by ambulation:    Pain in feet at night that wakes you up from your sleep:     Blood clot in your veins:    Leg swelling:         Pulmonary    Oxygen at home:    Productive cough:     Wheezing:         Neurologic    Sudden weakness in arms or legs:     Sudden numbness in arms or legs:     Sudden onset of difficulty speaking or slurred speech:    Temporary loss of vision in one eye:     Problems with dizziness:         Gastrointestinal    [name]:     Vomited blood:         Genitourinary    Burning when urinating:     Blood in urine:        Psychiatric    Major depression:         Hematologic    Bleeding problems:    Problems with blood clotting too easily:        Skin    Rashes or ulcers:        Constitutional    Fever or chills:      PHYSICAL EXAMINATION:  Vitals:   02/18/23 1317  BP: 127/62  Pulse: 81  Resp: 16  Temp: (!) 97.3 F (36.3 C)  TempSrc: Temporal  SpO2: 92%  Weight:  116 lb (52.6 kg)  Height: 4\' 11"  (1.499 m)    General:  WDWN in NAD; vital signs documented above Gait: Not observed HENT: WNL, normocephalic Pulmonary: normal non-labored breathing , without Rales, rhonchi,  wheezing Cardiac: {Desc; regular/irreg:14544} HR Abdomen: soft, NT, no masses Skin: {With/Without:20273} rashes Vascular Exam/Pulses: *** Extremities: {With/Without:20273} ischemic changes, {With/Without:20273} Gangrene , {With/Without:20273} cellulitis; {With/Without:20273} open wounds;  Musculoskeletal: no muscle wasting or atrophy  Neurologic: A&O X 3*** Psychiatric:  The pt has {Desc; normal/abnormal:11317::"Normal"} affect.   Non-Invasive Vascular Imaging:  5/624  +-------+-----------+-----------+------------+------------+  ABI/TBIToday's ABIToday's  TBIPrevious ABIPrevious TBI  +-------+-----------+-----------+------------+------------+  Right 1.21       0.36       1.23        0.54          +-------+-----------+-----------+------------+------------+  Left  1.09       0.2        1.25        0.54          +-------+-----------+-----------+------------+------------+      ASSESSMENT/PLAN:: 82 y.o. female here for follow up for peripheral artery disease. She also has venous insufficiency.  - Provided some reassurance bout her bruising  - She continues to have decreased toe pressures bilaterally, likely related to small vessel disease and diabetes mellitus, however there is no indication for any intervention at this time. She has bilateral triphasic waveforms - encourage increased exercise/ walking - Continue Aspirin and Statin  - She will follow up in 1 year with repeat ABI   Graceann Congress, PA-C [facility] [phone]  Clinic MD:   Myra Gianotti

## 2023-02-19 ENCOUNTER — Encounter (HOSPITAL_BASED_OUTPATIENT_CLINIC_OR_DEPARTMENT_OTHER): Payer: Self-pay | Admitting: Pulmonary Disease

## 2023-02-19 ENCOUNTER — Encounter: Payer: Self-pay | Admitting: Physician Assistant

## 2023-02-19 ENCOUNTER — Encounter: Payer: Self-pay | Admitting: Internal Medicine

## 2023-02-19 ENCOUNTER — Ambulatory Visit (INDEPENDENT_AMBULATORY_CARE_PROVIDER_SITE_OTHER): Payer: Medicare HMO | Admitting: Internal Medicine

## 2023-02-19 VITALS — BP 112/70 | HR 86 | Temp 98.0°F | Ht 59.0 in | Wt 99.0 lb

## 2023-02-19 DIAGNOSIS — E785 Hyperlipidemia, unspecified: Secondary | ICD-10-CM

## 2023-02-19 DIAGNOSIS — J4489 Other specified chronic obstructive pulmonary disease: Secondary | ICD-10-CM | POA: Diagnosis not present

## 2023-02-19 DIAGNOSIS — E1136 Type 2 diabetes mellitus with diabetic cataract: Secondary | ICD-10-CM | POA: Diagnosis not present

## 2023-02-19 DIAGNOSIS — N189 Chronic kidney disease, unspecified: Secondary | ICD-10-CM | POA: Diagnosis not present

## 2023-02-19 DIAGNOSIS — J9611 Chronic respiratory failure with hypoxia: Secondary | ICD-10-CM

## 2023-02-19 DIAGNOSIS — E118 Type 2 diabetes mellitus with unspecified complications: Secondary | ICD-10-CM

## 2023-02-19 DIAGNOSIS — Z0000 Encounter for general adult medical examination without abnormal findings: Secondary | ICD-10-CM

## 2023-02-19 DIAGNOSIS — Z7982 Long term (current) use of aspirin: Secondary | ICD-10-CM | POA: Diagnosis not present

## 2023-02-19 DIAGNOSIS — E1169 Type 2 diabetes mellitus with other specified complication: Secondary | ICD-10-CM | POA: Diagnosis not present

## 2023-02-19 DIAGNOSIS — I739 Peripheral vascular disease, unspecified: Secondary | ICD-10-CM | POA: Diagnosis not present

## 2023-02-19 DIAGNOSIS — J449 Chronic obstructive pulmonary disease, unspecified: Secondary | ICD-10-CM | POA: Diagnosis not present

## 2023-02-19 DIAGNOSIS — E1151 Type 2 diabetes mellitus with diabetic peripheral angiopathy without gangrene: Secondary | ICD-10-CM | POA: Diagnosis not present

## 2023-02-19 DIAGNOSIS — I129 Hypertensive chronic kidney disease with stage 1 through stage 4 chronic kidney disease, or unspecified chronic kidney disease: Secondary | ICD-10-CM | POA: Diagnosis not present

## 2023-02-19 DIAGNOSIS — I69351 Hemiplegia and hemiparesis following cerebral infarction affecting right dominant side: Secondary | ICD-10-CM | POA: Diagnosis not present

## 2023-02-19 DIAGNOSIS — E1122 Type 2 diabetes mellitus with diabetic chronic kidney disease: Secondary | ICD-10-CM | POA: Diagnosis not present

## 2023-02-19 DIAGNOSIS — J439 Emphysema, unspecified: Secondary | ICD-10-CM | POA: Diagnosis not present

## 2023-02-19 LAB — POCT GLYCOSYLATED HEMOGLOBIN (HGB A1C): HbA1c POC (<> result, manual entry): 6.2 % (ref 4.0–5.6)

## 2023-02-19 NOTE — Progress Notes (Unsigned)
   Subjective:   Patient ID: Sherry Lambert, female    DOB: Jan 29, 1941, 83 y.o.   MRN: [id number]  HPI The patient is here for physical.  PMH, [facility], social history reviewed and updated  Review of Systems  Constitutional: Negative.   HENT: Negative.    Eyes: Negative.   Respiratory:  Negative for cough, chest tightness and shortness of breath.   Cardiovascular:  Negative for chest pain, palpitations and leg swelling.  Gastrointestinal:  Negative for abdominal distention, abdominal pain, constipation, diarrhea, nausea and vomiting.  Musculoskeletal: Negative.   Skin: Negative.   Neurological: Negative.   Psychiatric/Behavioral: Negative.      Objective:  Physical Exam Constitutional:      Appearance: She is well-developed.  HENT:     Head: Normocephalic and atraumatic.  Cardiovascular:     Rate and Rhythm: Normal rate and regular rhythm.  Pulmonary:     Effort: Pulmonary effort is normal. No respiratory distress.     Breath sounds: Normal breath sounds. No wheezing or rales.  Abdominal:     General: Bowel sounds are normal. There is no distension.     Palpations: Abdomen is soft.     Tenderness: There is no abdominal tenderness. There is no rebound.  Musculoskeletal:     Cervical back: Normal range of motion.  Skin:    General: Skin is warm and dry.  Neurological:     Mental Status: She is alert and oriented to person, place, and time.     Coordination: Coordination normal.     Vitals:   02/19/23 1529  BP: 112/70  Pulse: 86  Temp: 98 F (36.7 C)  TempSrc: Oral  SpO2: 94%  Weight: 99 lb (44.9 kg)  Height: 4\' 11"  (1.499 m)    Assessment & Plan:

## 2023-02-21 ENCOUNTER — Encounter: Payer: Self-pay | Admitting: Internal Medicine

## 2023-02-21 NOTE — Assessment & Plan Note (Signed)
Recent follow up with vascular and overall stable. Limited activity due to other health conditions. She is on aspirin 81 mg daily and statin which we will continue.

## 2023-02-21 NOTE — Assessment & Plan Note (Signed)
POc HgA1c done today and is at goal. Continue with lifestyle control. Is on statin. Using gabapentin 300 mg BID for neuropathy.

## 2023-02-21 NOTE — Assessment & Plan Note (Signed)
Continues on oxygen up to 1.5 L which she is not using consistently. She is not very active overall.

## 2023-02-21 NOTE — Assessment & Plan Note (Signed)
Flu shot yearly. Pneumonia complete. Shingrix due at pharmacy. Tetanus due at pharmacy. Colonoscopy aged out. Mammogram aged out, pap smear aged out and dexa due. Counseled about sun safety and mole surveillance. Counseled about the dangers of distracted driving. Given 10 year screening recommendations.

## 2023-02-21 NOTE — Assessment & Plan Note (Signed)
Stable today but severe. Doing nebulized medications and seeing pulmonary.

## 2023-02-21 NOTE — Assessment & Plan Note (Signed)
Lipid panel up to date. Continue pravastatin 20 mg daily.

## 2023-02-25 ENCOUNTER — Other Ambulatory Visit (HOSPITAL_BASED_OUTPATIENT_CLINIC_OR_DEPARTMENT_OTHER): Payer: Self-pay | Admitting: Pulmonary Disease

## 2023-02-25 DIAGNOSIS — E1151 Type 2 diabetes mellitus with diabetic peripheral angiopathy without gangrene: Secondary | ICD-10-CM | POA: Diagnosis not present

## 2023-02-25 DIAGNOSIS — J439 Emphysema, unspecified: Secondary | ICD-10-CM | POA: Diagnosis not present

## 2023-02-25 DIAGNOSIS — E1136 Type 2 diabetes mellitus with diabetic cataract: Secondary | ICD-10-CM | POA: Diagnosis not present

## 2023-02-25 DIAGNOSIS — E1122 Type 2 diabetes mellitus with diabetic chronic kidney disease: Secondary | ICD-10-CM | POA: Diagnosis not present

## 2023-02-25 DIAGNOSIS — I129 Hypertensive chronic kidney disease with stage 1 through stage 4 chronic kidney disease, or unspecified chronic kidney disease: Secondary | ICD-10-CM | POA: Diagnosis not present

## 2023-02-25 DIAGNOSIS — E785 Hyperlipidemia, unspecified: Secondary | ICD-10-CM | POA: Diagnosis not present

## 2023-02-25 DIAGNOSIS — I69351 Hemiplegia and hemiparesis following cerebral infarction affecting right dominant side: Secondary | ICD-10-CM | POA: Diagnosis not present

## 2023-02-25 DIAGNOSIS — N189 Chronic kidney disease, unspecified: Secondary | ICD-10-CM | POA: Diagnosis not present

## 2023-02-25 DIAGNOSIS — J4489 Other specified chronic obstructive pulmonary disease: Secondary | ICD-10-CM | POA: Diagnosis not present

## 2023-02-25 DIAGNOSIS — Z7982 Long term (current) use of aspirin: Secondary | ICD-10-CM | POA: Diagnosis not present

## 2023-02-26 DIAGNOSIS — J189 Pneumonia, unspecified organism: Secondary | ICD-10-CM | POA: Diagnosis not present

## 2023-02-26 DIAGNOSIS — J449 Chronic obstructive pulmonary disease, unspecified: Secondary | ICD-10-CM | POA: Diagnosis not present

## 2023-02-27 ENCOUNTER — Other Ambulatory Visit: Payer: Self-pay

## 2023-02-27 DIAGNOSIS — I739 Peripheral vascular disease, unspecified: Secondary | ICD-10-CM

## 2023-03-06 ENCOUNTER — Other Ambulatory Visit (HOSPITAL_BASED_OUTPATIENT_CLINIC_OR_DEPARTMENT_OTHER): Payer: Self-pay | Admitting: Pulmonary Disease

## 2023-03-06 DIAGNOSIS — J432 Centrilobular emphysema: Secondary | ICD-10-CM

## 2023-03-06 DIAGNOSIS — J449 Chronic obstructive pulmonary disease, unspecified: Secondary | ICD-10-CM | POA: Diagnosis not present

## 2023-03-06 DIAGNOSIS — J189 Pneumonia, unspecified organism: Secondary | ICD-10-CM | POA: Diagnosis not present

## 2023-03-08 DIAGNOSIS — E1136 Type 2 diabetes mellitus with diabetic cataract: Secondary | ICD-10-CM | POA: Diagnosis not present

## 2023-03-08 DIAGNOSIS — I129 Hypertensive chronic kidney disease with stage 1 through stage 4 chronic kidney disease, or unspecified chronic kidney disease: Secondary | ICD-10-CM | POA: Diagnosis not present

## 2023-03-08 DIAGNOSIS — E785 Hyperlipidemia, unspecified: Secondary | ICD-10-CM | POA: Diagnosis not present

## 2023-03-08 DIAGNOSIS — I69351 Hemiplegia and hemiparesis following cerebral infarction affecting right dominant side: Secondary | ICD-10-CM | POA: Diagnosis not present

## 2023-03-08 DIAGNOSIS — Z7982 Long term (current) use of aspirin: Secondary | ICD-10-CM | POA: Diagnosis not present

## 2023-03-08 DIAGNOSIS — E1122 Type 2 diabetes mellitus with diabetic chronic kidney disease: Secondary | ICD-10-CM | POA: Diagnosis not present

## 2023-03-08 DIAGNOSIS — J439 Emphysema, unspecified: Secondary | ICD-10-CM | POA: Diagnosis not present

## 2023-03-08 DIAGNOSIS — J4489 Other specified chronic obstructive pulmonary disease: Secondary | ICD-10-CM | POA: Diagnosis not present

## 2023-03-08 DIAGNOSIS — E1151 Type 2 diabetes mellitus with diabetic peripheral angiopathy without gangrene: Secondary | ICD-10-CM | POA: Diagnosis not present

## 2023-03-08 DIAGNOSIS — N189 Chronic kidney disease, unspecified: Secondary | ICD-10-CM | POA: Diagnosis not present

## 2023-03-14 ENCOUNTER — Telehealth (HOSPITAL_BASED_OUTPATIENT_CLINIC_OR_DEPARTMENT_OTHER): Payer: Self-pay | Admitting: Pulmonary Disease

## 2023-03-14 DIAGNOSIS — E1151 Type 2 diabetes mellitus with diabetic peripheral angiopathy without gangrene: Secondary | ICD-10-CM | POA: Diagnosis not present

## 2023-03-14 DIAGNOSIS — J432 Centrilobular emphysema: Secondary | ICD-10-CM

## 2023-03-14 DIAGNOSIS — I129 Hypertensive chronic kidney disease with stage 1 through stage 4 chronic kidney disease, or unspecified chronic kidney disease: Secondary | ICD-10-CM | POA: Diagnosis not present

## 2023-03-14 DIAGNOSIS — J4489 Other specified chronic obstructive pulmonary disease: Secondary | ICD-10-CM | POA: Diagnosis not present

## 2023-03-14 DIAGNOSIS — E1136 Type 2 diabetes mellitus with diabetic cataract: Secondary | ICD-10-CM | POA: Diagnosis not present

## 2023-03-14 DIAGNOSIS — N189 Chronic kidney disease, unspecified: Secondary | ICD-10-CM | POA: Diagnosis not present

## 2023-03-14 DIAGNOSIS — E785 Hyperlipidemia, unspecified: Secondary | ICD-10-CM | POA: Diagnosis not present

## 2023-03-14 DIAGNOSIS — Z7982 Long term (current) use of aspirin: Secondary | ICD-10-CM | POA: Diagnosis not present

## 2023-03-14 DIAGNOSIS — I69351 Hemiplegia and hemiparesis following cerebral infarction affecting right dominant side: Secondary | ICD-10-CM | POA: Diagnosis not present

## 2023-03-14 DIAGNOSIS — E1122 Type 2 diabetes mellitus with diabetic chronic kidney disease: Secondary | ICD-10-CM | POA: Diagnosis not present

## 2023-03-14 DIAGNOSIS — J439 Emphysema, unspecified: Secondary | ICD-10-CM | POA: Diagnosis not present

## 2023-03-14 MED ORDER — BUDESONIDE 0.5 MG/2ML IN SUSP: 0.5000 mg | Freq: Two times a day (BID) | RESPIRATORY_TRACT | 5 refills | Status: DC

## 2023-03-14 MED ORDER — IPRATROPIUM-ALBUTEROL 0.5-2.5 (3) MG/3ML IN SOLN: 3.0000 mL | Freq: Four times a day (QID) | RESPIRATORY_TRACT | 5 refills | Status: DC | PRN

## 2023-03-14 MED ORDER — ARFORMOTEROL TARTRATE 15 MCG/2ML IN NEBU: 15.0000 ug | INHALATION_SOLUTION | Freq: Two times a day (BID) | RESPIRATORY_TRACT | 5 refills | Status: DC

## 2023-03-14 NOTE — Telephone Encounter (Signed)
Patient states she is having issues with dudesonide being refilled. They have been out of stock at [facility] but had luck with [facility] in [place] with last refill but now having the same issue and now states Dr. Everardo All would need to give the ok to have script refilled.  Patient would like to speak with Dr. Everardo All or nurse since she is running out and is confused. Please advise and call patient back.

## 2023-03-14 NOTE — Telephone Encounter (Signed)
Patient unsure if it is a supply issue. Offered to send rx's to different pharmacy but prefers [facility]. Will call us if there is any other needs.  All nebulizer meds refilled.

## 2023-03-15 ENCOUNTER — Telehealth: Payer: Self-pay | Admitting: Pulmonary Disease

## 2023-03-15 NOTE — Telephone Encounter (Signed)
Therapist calling saying she needs verbal orders for Occupational therapy once a week for 4 weeks  to increase home safety.  [phone] Moira Her Voice mail is secure and confidential so verbal  orders OK

## 2023-03-15 NOTE — Telephone Encounter (Signed)
Verbal orders given. Closing encounter.

## 2023-03-19 DIAGNOSIS — I69351 Hemiplegia and hemiparesis following cerebral infarction affecting right dominant side: Secondary | ICD-10-CM | POA: Diagnosis not present

## 2023-03-19 DIAGNOSIS — E785 Hyperlipidemia, unspecified: Secondary | ICD-10-CM | POA: Diagnosis not present

## 2023-03-19 DIAGNOSIS — E1136 Type 2 diabetes mellitus with diabetic cataract: Secondary | ICD-10-CM | POA: Diagnosis not present

## 2023-03-19 DIAGNOSIS — E1122 Type 2 diabetes mellitus with diabetic chronic kidney disease: Secondary | ICD-10-CM | POA: Diagnosis not present

## 2023-03-19 DIAGNOSIS — E1151 Type 2 diabetes mellitus with diabetic peripheral angiopathy without gangrene: Secondary | ICD-10-CM | POA: Diagnosis not present

## 2023-03-19 DIAGNOSIS — Z9981 Dependence on supplemental oxygen: Secondary | ICD-10-CM | POA: Diagnosis not present

## 2023-03-19 DIAGNOSIS — Z87442 Personal history of urinary calculi: Secondary | ICD-10-CM | POA: Diagnosis not present

## 2023-03-19 DIAGNOSIS — Z7982 Long term (current) use of aspirin: Secondary | ICD-10-CM | POA: Diagnosis not present

## 2023-03-19 DIAGNOSIS — Z85038 Personal history of other malignant neoplasm of large intestine: Secondary | ICD-10-CM | POA: Diagnosis not present

## 2023-03-19 DIAGNOSIS — J449 Chronic obstructive pulmonary disease, unspecified: Secondary | ICD-10-CM | POA: Diagnosis not present

## 2023-03-19 DIAGNOSIS — J439 Emphysema, unspecified: Secondary | ICD-10-CM | POA: Diagnosis not present

## 2023-03-19 DIAGNOSIS — Z87891 Personal history of nicotine dependence: Secondary | ICD-10-CM | POA: Diagnosis not present

## 2023-03-19 DIAGNOSIS — N189 Chronic kidney disease, unspecified: Secondary | ICD-10-CM | POA: Diagnosis not present

## 2023-03-19 DIAGNOSIS — J4489 Other specified chronic obstructive pulmonary disease: Secondary | ICD-10-CM | POA: Diagnosis not present

## 2023-03-19 DIAGNOSIS — I129 Hypertensive chronic kidney disease with stage 1 through stage 4 chronic kidney disease, or unspecified chronic kidney disease: Secondary | ICD-10-CM | POA: Diagnosis not present

## 2023-03-25 ENCOUNTER — Telehealth (HOSPITAL_BASED_OUTPATIENT_CLINIC_OR_DEPARTMENT_OTHER): Payer: Self-pay | Admitting: Pulmonary Disease

## 2023-03-25 DIAGNOSIS — J42 Unspecified chronic bronchitis: Secondary | ICD-10-CM

## 2023-03-25 NOTE — Telephone Encounter (Signed)
Routing to [employer] triage since we do not having a CMA working [employer] triage. Please advise.

## 2023-03-25 NOTE — Telephone Encounter (Signed)
Patient states she needs a new nebulizer as hers has stopped working completely. Patient states if it is faster could we order from a location she can go and pick it up so she does not have to wait for the nebulizer to be delivered. Please advise and call patient back.

## 2023-03-26 ENCOUNTER — Telehealth (HOSPITAL_BASED_OUTPATIENT_CLINIC_OR_DEPARTMENT_OTHER): Payer: Self-pay | Admitting: Pulmonary Disease

## 2023-03-26 NOTE — Telephone Encounter (Signed)
I called and spoke with patient. Patient is aware neb machine order has been placed.

## 2023-03-26 NOTE — Telephone Encounter (Signed)
Error. Encounter already open. Signing Encounter

## 2023-03-26 NOTE — Telephone Encounter (Signed)
Patient has called for the third time today asking for an update regarding her nebulizer. Sending back as priority as patient relies heavily on her nebulizer. Please advise.

## 2023-03-26 NOTE — Telephone Encounter (Signed)
Urgent nebulizer order placed

## 2023-03-27 DIAGNOSIS — J410 Simple chronic bronchitis: Secondary | ICD-10-CM | POA: Diagnosis not present

## 2023-03-27 DIAGNOSIS — J439 Emphysema, unspecified: Secondary | ICD-10-CM | POA: Diagnosis not present

## 2023-03-27 DIAGNOSIS — Z87442 Personal history of urinary calculi: Secondary | ICD-10-CM | POA: Diagnosis not present

## 2023-03-27 DIAGNOSIS — E1151 Type 2 diabetes mellitus with diabetic peripheral angiopathy without gangrene: Secondary | ICD-10-CM | POA: Diagnosis not present

## 2023-03-27 DIAGNOSIS — E1136 Type 2 diabetes mellitus with diabetic cataract: Secondary | ICD-10-CM | POA: Diagnosis not present

## 2023-03-27 DIAGNOSIS — Z87891 Personal history of nicotine dependence: Secondary | ICD-10-CM | POA: Diagnosis not present

## 2023-03-27 DIAGNOSIS — Z9981 Dependence on supplemental oxygen: Secondary | ICD-10-CM | POA: Diagnosis not present

## 2023-03-27 DIAGNOSIS — E785 Hyperlipidemia, unspecified: Secondary | ICD-10-CM | POA: Diagnosis not present

## 2023-03-27 DIAGNOSIS — E1122 Type 2 diabetes mellitus with diabetic chronic kidney disease: Secondary | ICD-10-CM | POA: Diagnosis not present

## 2023-03-27 DIAGNOSIS — I129 Hypertensive chronic kidney disease with stage 1 through stage 4 chronic kidney disease, or unspecified chronic kidney disease: Secondary | ICD-10-CM | POA: Diagnosis not present

## 2023-03-27 DIAGNOSIS — Z85038 Personal history of other malignant neoplasm of large intestine: Secondary | ICD-10-CM | POA: Diagnosis not present

## 2023-03-27 DIAGNOSIS — J449 Chronic obstructive pulmonary disease, unspecified: Secondary | ICD-10-CM | POA: Diagnosis not present

## 2023-03-27 DIAGNOSIS — Z7982 Long term (current) use of aspirin: Secondary | ICD-10-CM | POA: Diagnosis not present

## 2023-03-27 DIAGNOSIS — I69351 Hemiplegia and hemiparesis following cerebral infarction affecting right dominant side: Secondary | ICD-10-CM | POA: Diagnosis not present

## 2023-03-27 DIAGNOSIS — J4489 Other specified chronic obstructive pulmonary disease: Secondary | ICD-10-CM | POA: Diagnosis not present

## 2023-03-27 DIAGNOSIS — N189 Chronic kidney disease, unspecified: Secondary | ICD-10-CM | POA: Diagnosis not present

## 2023-03-28 DIAGNOSIS — J449 Chronic obstructive pulmonary disease, unspecified: Secondary | ICD-10-CM | POA: Diagnosis not present

## 2023-03-28 DIAGNOSIS — J189 Pneumonia, unspecified organism: Secondary | ICD-10-CM | POA: Diagnosis not present

## 2023-04-03 DIAGNOSIS — Z87442 Personal history of urinary calculi: Secondary | ICD-10-CM | POA: Diagnosis not present

## 2023-04-03 DIAGNOSIS — E1122 Type 2 diabetes mellitus with diabetic chronic kidney disease: Secondary | ICD-10-CM | POA: Diagnosis not present

## 2023-04-03 DIAGNOSIS — Z87891 Personal history of nicotine dependence: Secondary | ICD-10-CM | POA: Diagnosis not present

## 2023-04-03 DIAGNOSIS — E1151 Type 2 diabetes mellitus with diabetic peripheral angiopathy without gangrene: Secondary | ICD-10-CM | POA: Diagnosis not present

## 2023-04-03 DIAGNOSIS — I129 Hypertensive chronic kidney disease with stage 1 through stage 4 chronic kidney disease, or unspecified chronic kidney disease: Secondary | ICD-10-CM | POA: Diagnosis not present

## 2023-04-03 DIAGNOSIS — Z9981 Dependence on supplemental oxygen: Secondary | ICD-10-CM | POA: Diagnosis not present

## 2023-04-03 DIAGNOSIS — J449 Chronic obstructive pulmonary disease, unspecified: Secondary | ICD-10-CM | POA: Diagnosis not present

## 2023-04-03 DIAGNOSIS — J4489 Other specified chronic obstructive pulmonary disease: Secondary | ICD-10-CM | POA: Diagnosis not present

## 2023-04-03 DIAGNOSIS — N189 Chronic kidney disease, unspecified: Secondary | ICD-10-CM | POA: Diagnosis not present

## 2023-04-03 DIAGNOSIS — E785 Hyperlipidemia, unspecified: Secondary | ICD-10-CM | POA: Diagnosis not present

## 2023-04-03 DIAGNOSIS — E1136 Type 2 diabetes mellitus with diabetic cataract: Secondary | ICD-10-CM | POA: Diagnosis not present

## 2023-04-03 DIAGNOSIS — Z7982 Long term (current) use of aspirin: Secondary | ICD-10-CM | POA: Diagnosis not present

## 2023-04-03 DIAGNOSIS — Z85038 Personal history of other malignant neoplasm of large intestine: Secondary | ICD-10-CM | POA: Diagnosis not present

## 2023-04-03 DIAGNOSIS — I69351 Hemiplegia and hemiparesis following cerebral infarction affecting right dominant side: Secondary | ICD-10-CM | POA: Diagnosis not present

## 2023-04-03 DIAGNOSIS — J439 Emphysema, unspecified: Secondary | ICD-10-CM | POA: Diagnosis not present

## 2023-04-05 ENCOUNTER — Other Ambulatory Visit (HOSPITAL_BASED_OUTPATIENT_CLINIC_OR_DEPARTMENT_OTHER): Payer: Self-pay | Admitting: Pulmonary Disease

## 2023-04-06 DIAGNOSIS — J449 Chronic obstructive pulmonary disease, unspecified: Secondary | ICD-10-CM | POA: Diagnosis not present

## 2023-04-06 DIAGNOSIS — J189 Pneumonia, unspecified organism: Secondary | ICD-10-CM | POA: Diagnosis not present

## 2023-04-16 DIAGNOSIS — Z87442 Personal history of urinary calculi: Secondary | ICD-10-CM | POA: Diagnosis not present

## 2023-04-16 DIAGNOSIS — J449 Chronic obstructive pulmonary disease, unspecified: Secondary | ICD-10-CM | POA: Diagnosis not present

## 2023-04-16 DIAGNOSIS — E785 Hyperlipidemia, unspecified: Secondary | ICD-10-CM | POA: Diagnosis not present

## 2023-04-16 DIAGNOSIS — E1151 Type 2 diabetes mellitus with diabetic peripheral angiopathy without gangrene: Secondary | ICD-10-CM | POA: Diagnosis not present

## 2023-04-16 DIAGNOSIS — J439 Emphysema, unspecified: Secondary | ICD-10-CM | POA: Diagnosis not present

## 2023-04-16 DIAGNOSIS — Z9981 Dependence on supplemental oxygen: Secondary | ICD-10-CM | POA: Diagnosis not present

## 2023-04-16 DIAGNOSIS — E1136 Type 2 diabetes mellitus with diabetic cataract: Secondary | ICD-10-CM | POA: Diagnosis not present

## 2023-04-16 DIAGNOSIS — Z7982 Long term (current) use of aspirin: Secondary | ICD-10-CM | POA: Diagnosis not present

## 2023-04-16 DIAGNOSIS — N189 Chronic kidney disease, unspecified: Secondary | ICD-10-CM | POA: Diagnosis not present

## 2023-04-16 DIAGNOSIS — I69351 Hemiplegia and hemiparesis following cerebral infarction affecting right dominant side: Secondary | ICD-10-CM | POA: Diagnosis not present

## 2023-04-16 DIAGNOSIS — Z87891 Personal history of nicotine dependence: Secondary | ICD-10-CM | POA: Diagnosis not present

## 2023-04-16 DIAGNOSIS — E1122 Type 2 diabetes mellitus with diabetic chronic kidney disease: Secondary | ICD-10-CM | POA: Diagnosis not present

## 2023-04-16 DIAGNOSIS — I129 Hypertensive chronic kidney disease with stage 1 through stage 4 chronic kidney disease, or unspecified chronic kidney disease: Secondary | ICD-10-CM | POA: Diagnosis not present

## 2023-04-16 DIAGNOSIS — J4489 Other specified chronic obstructive pulmonary disease: Secondary | ICD-10-CM | POA: Diagnosis not present

## 2023-04-16 DIAGNOSIS — Z85038 Personal history of other malignant neoplasm of large intestine: Secondary | ICD-10-CM | POA: Diagnosis not present

## 2023-04-23 ENCOUNTER — Other Ambulatory Visit: Payer: Self-pay | Admitting: Internal Medicine

## 2023-04-27 ENCOUNTER — Other Ambulatory Visit: Payer: Self-pay | Admitting: Internal Medicine

## 2023-04-28 DIAGNOSIS — J189 Pneumonia, unspecified organism: Secondary | ICD-10-CM | POA: Diagnosis not present

## 2023-04-28 DIAGNOSIS — J449 Chronic obstructive pulmonary disease, unspecified: Secondary | ICD-10-CM | POA: Diagnosis not present

## 2023-04-30 ENCOUNTER — Telehealth: Payer: Self-pay | Admitting: Internal Medicine

## 2023-04-30 ENCOUNTER — Other Ambulatory Visit: Payer: Self-pay

## 2023-04-30 MED ORDER — GABAPENTIN 300 MG PO CAPS: 600.0000 mg | ORAL_CAPSULE | Freq: Two times a day (BID) | ORAL | 0 refills | Status: DC

## 2023-04-30 NOTE — Telephone Encounter (Signed)
Prescription Request  04/30/2023  LOV: 02/19/2023  What is the name of the medication or equipment? gabapentin (NEURONTIN) 300 MG capsule   Have you contacted your pharmacy to request a refill? No   Which pharmacy would you like this sent to?    [facility], [place] - [address] [facility] [place] [address] Phone: [phone] Fax: [phone]   Patient notified that their request is being sent to the clinical staff for review and that they should receive a response within 2 business days.   Please advise at Mobile [phone] (mobile)

## 2023-04-30 NOTE — Telephone Encounter (Signed)
Sent in

## 2023-05-07 DIAGNOSIS — J449 Chronic obstructive pulmonary disease, unspecified: Secondary | ICD-10-CM | POA: Diagnosis not present

## 2023-05-07 DIAGNOSIS — J189 Pneumonia, unspecified organism: Secondary | ICD-10-CM | POA: Diagnosis not present

## 2023-05-17 ENCOUNTER — Encounter (HOSPITAL_BASED_OUTPATIENT_CLINIC_OR_DEPARTMENT_OTHER): Payer: Self-pay | Admitting: Pulmonary Disease

## 2023-05-17 ENCOUNTER — Ambulatory Visit (HOSPITAL_BASED_OUTPATIENT_CLINIC_OR_DEPARTMENT_OTHER): Payer: Medicare HMO | Admitting: Pulmonary Disease

## 2023-05-17 VITALS — BP 146/58 | HR 85 | Resp 18 | Ht 59.0 in | Wt 103.1 lb

## 2023-05-17 DIAGNOSIS — G4734 Idiopathic sleep related nonobstructive alveolar hypoventilation: Secondary | ICD-10-CM

## 2023-05-17 NOTE — Progress Notes (Unsigned)
Synopsis: Referred in 2017 for COPD by Myrlene Broker, *  Subjective:   PATIENT ID: Sherry Lambert GENDER: female DOB: 26-Jul-1941, MRN: [id number]  No chief complaint on file.  [name] is a 82 year old female former smoker with COPD, hx CVA in 2016 who presents for follow-up.   Synopsis: She is a former patient of Dr. Chestine Spore. She was diagnosed with COPD in 2016. Her last COPD exacerbation was one year ago Feb 2021 requiring hospitalization. She transitioned from Trelegy to [facility] in March 2022 due to thrush and reports symptoms remain well-controlled.  12/20/20 Since our last visit, she reports her symptoms are well contolled on Anoro. [name] has resolved off inhaled corticosteroids. After sitting three hours outside, she used her Albuterol yesterday for shortness of breath and nasal congestion and believes this is related to the pollen. Otherwise she does routinely use her rescue inhaler. Denies wheezing or cough.  10/10/21 Since our last visit she is compliant with Anoro. She is ambulatory with a walker at home. In the last two weeks she has had worsening shortness of breath and has had to use albuterol once a day. No coughing or wheezing. No fevers or chills. Her last exacerbation was in 05/2021 which was treated with doxycycline. She will do 30 min exercise twice a week with a TV program and the exercises will vary aerobic to strengthening.  02/26/22 Since our last visit she was hospitalized in June for pneumonia. No evidence of aspiration. Improved on antibiotics. [name] with exertion. No wheezing or cough.  08/16/22 Family is present and provides additional history. In the last several months she has had multiple exacerbations including in September, October and November (Augment/Doxy for pneumonia). Duonebs were added to her regimen. At the end of October was started on chronic prednisone 5 mg daily by Dr. Tonia Brooms. She continues to have shortness of breath with  minimal exertion. Denies cough or wheezing. She is compliant with her meds but does not feel it is effective and not able to inhale properly. Since her stroke she is weak and feels her breathing has declined with her strength.  11/12/22 Present with son who provides additional history. Since our last visit she has called the office to report the combo nebs are not effective and was seen in the ED on 10/29/22. Discharged with doxycycline, no new O2 requirements. Called office after hours on 11/04/22 and prescribed prednisone 40 mg x 5 days. Compliant with her nebulizer regimen but does feel that the steroid nebulizer causes a filmy taste. Son reports she is overall improved on this regimen with only shortness of breath on exertion. No cough or wheezing. Compliant with nocturnal oxygen.   02/11/23 She was doing well with her current nebulizer regimen. No further thrush after reducing Pulmicort to daily. However unable to complete nystatin due to individual packages - needing to cut with knife. In the last two days she is having shortness of breath. Denies wheezing or cough. Denies fevers, chills. Compliant with oxygen. Discussed goals of care and expresses she would want to be DNR. Family present in clinic today.  05/17/23 Since our last visit she feels overall well. Has shortness of breath with exertion. Denies cough, wheezing. On nebulizer regimen as documented below. Can use walker and does standing exercises for 20 min. Able to perform housework including washing dishes, laundry and some cooking. Showers without assistance. Daughter is working on ramp access to her home.   Prior inhalers: Trelegy -  thrush  Social history: Quit in 2004. 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   [name]    Cataract    Chronic kidney disease    kidney stone   Colitis    Colon cancer (HCC) 06/2017   cecum   Diabetes mellitus without complication (HCC)     no meds taken   Emphysema of lung (HCC)    GERD (gastroesophageal reflux disease)    Hyperlipidemia    Hypertension    Peripheral arterial disease (HCC)    [name]    Stroke [facility])    November 2016- Thanksgiving night     Allergies  Allergen Reactions   Lipitor [Atorvastatin] Itching     Outpatient Medications Prior to Visit  Medication Sig Dispense Refill   acetaminophen (TYLENOL) 500 MG tablet Take 500 mg by mouth daily.      albuterol (VENTOLIN HFA) 108 (90 Base) MCG/ACT inhaler Inhale 1-2 puffs into the lungs every 4 (four) hours as needed for wheezing or shortness of breath. 18 g 6   arformoterol (BROVANA) 15 MCG/2ML NEBU Take 2 mLs (15 mcg total) by nebulization 2 (two) times daily. 120 mL 5   aspirin EC 81 MG tablet Take 1 tablet (81 mg total) by mouth daily. 90 tablet 3   budesonide (PULMICORT) 0.5 MG/2ML nebulizer solution Take 2 mLs (0.5 mg total) by nebulization in the morning and at bedtime. 120 mL 5   gabapentin (NEURONTIN) 300 MG capsule Take 2 capsules (600 mg total) by mouth 2 (two) times daily. 360 capsule 0   ipratropium-albuterol (DUONEB) 0.5-2.5 (3) MG/3ML SOLN Take 3 mLs by nebulization every 6 (six) hours as needed. 360 mL 5   nystatin (MYCOSTATIN) 100000 UNIT/ML suspension TAKE  5 ML BY MOUTH 4 TIMES DAILY 120 mL 0   nystatin (MYCOSTATIN) 100000 UNIT/ML suspension Take 5 mLs (500,000 Units total) by mouth 4 (four) times daily. 120 mL 0   omeprazole (PRILOSEC) 40 MG capsule Take 1 capsule by mouth once daily 90 capsule 0   Polyvinyl Alcohol-Povidone (REFRESH OP) Place 1 drop into both eyes daily as needed (For dry eyes).     pravastatin (PRAVACHOL) 20 MG tablet Take 1 tablet by mouth once daily 90 tablet 3   traMADol (ULTRAM) 50 MG tablet TAKE 1 TABLET BY MOUTH ONCE DAILY AS NEEDED 30 tablet 5   VITAMIN D PO Take 1 tablet by mouth daily.     No facility-administered medications prior to visit.    Review of Systems  Constitutional:  Negative for  chills, diaphoresis, fever, malaise/fatigue and weight loss.  HENT:  Negative for congestion.   Respiratory:  Positive for shortness of breath. Negative for cough, hemoptysis, sputum production and wheezing.   Cardiovascular:  Negative for chest pain, palpitations and leg swelling.   Objective:   Vitals:   05/17/23 1557  BP: (!) 146/58  Pulse: 85  Resp: 18  SpO2: 94%  Weight: 103 lb 1.6 oz (46.8 kg)  Height: 4\' 11"  (1.499 m)   94%  BMI Readings from Last 3 Encounters:  05/17/23 20.82 kg/m  02/19/23 20.00 kg/m  02/18/23 23.43 kg/m   Wt Readings from Last 3 Encounters:  05/17/23 103 lb 1.6 oz (46.8 kg)  02/19/23 99 lb (44.9 kg)  02/18/23 116 lb (52.6 kg)   Physical Exam: General: Frail and chronically ill-appearing, no acute distress HENT: , AT Eyes: EOMI, no scleral icterus Respiratory: Diminished but clear  to auscultation bilaterally.  No crackles, wheezing or rales Cardiovascular: RRR, -M/R/G, no JVD Extremities:-Edema,-tenderness Neuro: AAO x4, CNII-XII grossly intact Psych: Normal mood, normal affect  Chest Imaging- films reviewed: CTA Chest 10/27/2019-emphysema, bilateral dependent pleural effusions.  No PE, no significant mediastinal or hilar adenopathy.  Hernia with stomach and abdominal fat contents protruding into the mediastinum. CXR 05/22/21 - COPD. No infiltrate effusion or edema CXR 02/26/22 - Resolution of lower lobe infiltrates  Pulmonary Functions Testing Results:    Latest Ref Rng & Units 11/01/2020    3:05 PM 02/13/2016    1:04 PM  PFT Results  FVC-Pre L 1.82  1.82   FVC-Predicted Pre % 82  76   FVC-Post L 1.80  2.02   FVC-Predicted Post % 81  84   Pre FEV1/FVC % % 48  45   Post FEV1/FCV % % 46  47   FEV1-Pre L 0.87  0.82   FEV1-Predicted Pre % 52  46   FEV1-Post L 0.83  0.94   DLCO uncorrected ml/min/mmHg 9.89  9.75   DLCO UNC% % 60  51   DLCO corrected ml/min/mmHg 9.89  9.26   DLCO COR %Predicted % 60  49   DLVA Predicted % 80  64   TLC  L 5.04    TLC % Predicted % 113    RV % Predicted % 159     2017- severe obstruction with out significant bronchodilator reversibility.  Moderate diffusion impairment.  Flow volume loop supports obstruction.  2019 spirometry: FVC 1.8 (75%) FEV1 0.7 (39%) Ratio 38%  PFT 11/01/20 FVC 1.8 (81%) FEV1 0.83 (50%) Ratio 48  TLC 113% RV 159% DLCO 60% Interpretation: Moderately severe obstructive defect with air trapping and reduced DLCO consistent with emphysema.  ONO study 03/02/22 - SpO2 <88% 15 min 44 seconds. 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 Multiple exacerbations including in September, October and November 2023 and Feb and March 2024. Currently in exacerbation. Discussed clinical course and management of COPD including bronchodilator regimen, preventive care and action plan for exacerbation.  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 Multiple exacerbations including in September, October and November 2023 and Feb and March 2024 and June 2024. Currently not in exacerbation and doing well on nebulizers. Discussed clinical course and management of COPD including bronchodilator regimen, preventive care including vaccinations and action plan for exacerbation. Addressed questions including using Duonebs PRN   COPD with emphysema --CONTINUE nebulizer regimen as follows:  --Pulmicort/Budesonide ONE neb in the morning  --Brovana/aformoterol ONE neb in the morning and evening  --Duoneb/ipratropium-albuterol ONE neb every morning and evening  --CONTINUE Albuterol as needed for shortness of breath or wheezing --Not a candidate for pulmonary rehab right now. Working with PT --Not a candidate for chronic prednisone therapy due to ineffectiveness  Nocturnal hypoxemia --CONTINUE 1L O2 nightly for goal >88%. Due for recertication --ORDER overnight oximetry on room air  GOC --Palliative care  offered. Declined --Previously discussed considering what it means to need mechanical ventilation or CPR --Encouraged to continue discussions with family. --Readdress at future visit  Health Maintenance Immunization History  Administered Date(s) Administered   Fluad Quad(high Dose 65+) 06/18/2019, 07/13/2020, 07/19/2021, 07/02/2022   Influenza, High Dose Seasonal PF 07/27/2013, 05/14/2016, 05/20/2017, 06/05/2018   Influenza,inj,Quad PF,6+ Mos 08/03/2014, 06/21/2015   Influenza,inj,quad, With Preservative 06/04/2019   Influenza-Unspecified 07/04/2012, 06/18/2022   PFIZER(Purple Top)SARS-COV-2 Vaccination  11/17/2019, 12/08/2019, 08/17/2020   Pneumococcal Conjugate-13 11/11/2015   Pneumococcal-Unspecified 09/03/2010   Td 09/03/2012   CT Lung Screening - not a candidate. Quit smoking > 15 years ago  Orders Placed This Encounter  Procedures   Pulse oximetry, overnight    On room air    Standing Status:   Future    Standing Expiration Date:   05/16/2024   No orders of the defined types were placed in this encounter.  I have spent a total time of 35-minutes on the day of the appointment including chart review, data review, collecting history, coordinating care and discussing medical diagnosis and plan with the patient/family. Past medical history, allergies, medications were reviewed. Pertinent imaging, labs and tests included in this note have been reviewed and interpreted independently by me.  Saanvika Vazques Mechele Collin, MD [facility] 05/17/2023 4:12 PM

## 2023-05-17 NOTE — Patient Instructions (Signed)
COPD with emphysema --CONTINUE nebulizer regimen as follows:  --Pulmicort/Budesonide ONE neb in the morning  --Brovana/aformoterol ONE neb in the morning and evening  --Duoneb/ipratropium-albuterol ONE neb every morning and evening  --CONTINUE Albuterol as needed for shortness of breath or wheezing --Not a candidate for pulmonary rehab right now. Will refer to home health PT --Discontinue chronic prednisone therapy due to ineffectiveness  Nocturnal hypoxemia --CONTINUE 1L O2 nightly for goal >88%. Due for recertication --ORDER overnight oximetry on room air

## 2023-05-21 ENCOUNTER — Telehealth: Payer: Self-pay | Admitting: Pulmonary Disease

## 2023-05-21 NOTE — Telephone Encounter (Signed)
Patient has thrush and needs medicine to be called in.  [place] [facility]

## 2023-05-23 MED ORDER — NYSTATIN 100000 UNIT/ML MT SUSP: 5.0000 mL | Freq: Four times a day (QID) | OROMUCOSAL | 0 refills | Status: AC

## 2023-05-23 NOTE — Telephone Encounter (Signed)
Patient reports recurrent thrush. Has responded to nystatin wash in the past in May and June 2024. Re-ordered nystatin suspension to preferred pharmacy.

## 2023-05-29 DIAGNOSIS — J449 Chronic obstructive pulmonary disease, unspecified: Secondary | ICD-10-CM | POA: Diagnosis not present

## 2023-05-29 DIAGNOSIS — J189 Pneumonia, unspecified organism: Secondary | ICD-10-CM | POA: Diagnosis not present

## 2023-06-06 DIAGNOSIS — J189 Pneumonia, unspecified organism: Secondary | ICD-10-CM | POA: Diagnosis not present

## 2023-06-06 DIAGNOSIS — J449 Chronic obstructive pulmonary disease, unspecified: Secondary | ICD-10-CM | POA: Diagnosis not present

## 2023-06-12 DIAGNOSIS — G4730 Sleep apnea, unspecified: Secondary | ICD-10-CM | POA: Diagnosis not present

## 2023-06-12 DIAGNOSIS — R0902 Hypoxemia: Secondary | ICD-10-CM | POA: Diagnosis not present

## 2023-06-26 ENCOUNTER — Telehealth (HOSPITAL_BASED_OUTPATIENT_CLINIC_OR_DEPARTMENT_OTHER): Payer: Self-pay | Admitting: Pulmonary Disease

## 2023-06-26 DIAGNOSIS — G4734 Idiopathic sleep related nonobstructive alveolar hypoventilation: Secondary | ICD-10-CM

## 2023-06-26 NOTE — Telephone Encounter (Signed)
Overnight Oximetry Results Date: 06/26/23 SpO2 <88% 25 min 56 sec.  Nadir SpO2 78%  Qualified for oxygen  Assessment/Plan Nocturnal Hypoxemia --RECERTIFY for 1L O2 via [place] nightly. Patient already on oxygen at home but needs new re-certification order

## 2023-06-28 DIAGNOSIS — J189 Pneumonia, unspecified organism: Secondary | ICD-10-CM | POA: Diagnosis not present

## 2023-06-28 DIAGNOSIS — J449 Chronic obstructive pulmonary disease, unspecified: Secondary | ICD-10-CM | POA: Diagnosis not present

## 2023-07-03 NOTE — Telephone Encounter (Signed)
I tried calling the pt and there was no answer and no option to leave msg, Will call back   I placed o2 order since this is a continuation.

## 2023-07-07 ENCOUNTER — Other Ambulatory Visit: Payer: Self-pay | Admitting: Internal Medicine

## 2023-07-07 DIAGNOSIS — J189 Pneumonia, unspecified organism: Secondary | ICD-10-CM | POA: Diagnosis not present

## 2023-07-07 DIAGNOSIS — J449 Chronic obstructive pulmonary disease, unspecified: Secondary | ICD-10-CM | POA: Diagnosis not present

## 2023-07-23 ENCOUNTER — Other Ambulatory Visit: Payer: Self-pay | Admitting: Internal Medicine

## 2023-07-29 ENCOUNTER — Telehealth: Payer: Self-pay | Admitting: Pulmonary Disease

## 2023-07-29 DIAGNOSIS — J189 Pneumonia, unspecified organism: Secondary | ICD-10-CM | POA: Diagnosis not present

## 2023-07-29 DIAGNOSIS — J449 Chronic obstructive pulmonary disease, unspecified: Secondary | ICD-10-CM | POA: Diagnosis not present

## 2023-07-29 MED ORDER — PREDNISONE 10 MG PO TABS: ORAL_TABLET | ORAL | 0 refills | Status: DC

## 2023-07-29 MED ORDER — AZITHROMYCIN 250 MG PO TABS
ORAL_TABLET | ORAL | 0 refills | Status: DC
Start: 2023-07-29 — End: 2023-08-06

## 2023-07-29 NOTE — Telephone Encounter (Signed)
Spoke with patient and she reports wheezing that started this morning. She denies cough, increased shob, fevers/chills. She states she "never has a cough, even when I have bronchitis". She has taken Pulmicort and Duoneb this morning, as usual, with little improvement in the wheezing.   Please advise, thank you!

## 2023-07-29 NOTE — Telephone Encounter (Signed)
 Pulmonary Telephone Encounter  Patient reports two days of shortness of breath and today now with wheezing this morning and a rattle in her chest. Has been compliant with her nebulizers for 48 hours without relief  COPD exacerbation --Prednisone taper and azithromycin ordered --Advised to present to ED if symptoms worsen or contact office if office visit needed.  Closing encounter.

## 2023-07-29 NOTE — Telephone Encounter (Signed)
Pt calling in bc she is wheezing a lot

## 2023-07-29 NOTE — Telephone Encounter (Signed)
Pt returning call

## 2023-08-02 ENCOUNTER — Other Ambulatory Visit: Payer: Self-pay | Admitting: Internal Medicine

## 2023-08-05 ENCOUNTER — Telehealth (INDEPENDENT_AMBULATORY_CARE_PROVIDER_SITE_OTHER): Payer: Medicare HMO | Admitting: Internal Medicine

## 2023-08-05 DIAGNOSIS — I69398 Other sequelae of cerebral infarction: Secondary | ICD-10-CM | POA: Diagnosis not present

## 2023-08-05 DIAGNOSIS — I10 Essential (primary) hypertension: Secondary | ICD-10-CM | POA: Diagnosis not present

## 2023-08-05 DIAGNOSIS — E785 Hyperlipidemia, unspecified: Secondary | ICD-10-CM

## 2023-08-05 DIAGNOSIS — R209 Unspecified disturbances of skin sensation: Secondary | ICD-10-CM

## 2023-08-05 DIAGNOSIS — E118 Type 2 diabetes mellitus with unspecified complications: Secondary | ICD-10-CM | POA: Diagnosis not present

## 2023-08-05 DIAGNOSIS — E1169 Type 2 diabetes mellitus with other specified complication: Secondary | ICD-10-CM | POA: Diagnosis not present

## 2023-08-05 DIAGNOSIS — J449 Chronic obstructive pulmonary disease, unspecified: Secondary | ICD-10-CM | POA: Diagnosis not present

## 2023-08-05 MED ORDER — GABAPENTIN 300 MG PO CAPS: 600.0000 mg | ORAL_CAPSULE | Freq: Two times a day (BID) | ORAL | 3 refills | Status: DC

## 2023-08-05 NOTE — Progress Notes (Unsigned)
Virtual Visit via Video Note  I connected with [name] on 08/05/23 at  3:40 PM EST by a video enabled telemedicine application and verified that I am speaking with the correct person using two identifiers.  The patient and the provider were at separate locations throughout the entire encounter. Patient location: {}, Provider location: work   I discussed the limitations of evaluation and management by telemedicine and the availability of in person appointments. The patient expressed understanding and agreed to proceed. The patient and the provider were the only parties present for the visit unless noted in HPI below.  History of Present Illness: The patient is a 82 y.o. {} with visit for {}. Started {}. Has {}. Denies {}. Overall it is {}. Has tried {}  Observations/Objective: Appearance: {}, breathing appears {}, {} grooming, abdomen {} appear distended, throat {}, memory {}, mental status is {}  Assessment and Plan: See problem oriented charting  Follow Up Instructions: {}  I discussed the assessment and treatment plan with the patient. The patient was provided an opportunity to ask questions and all were answered. The patient agreed with the plan and demonstrated an understanding of the instructions.   The patient was advised to call back or seek an in-person evaluation if the symptoms worsen or if the condition fails to improve as anticipated.  Myrlene Broker, MD

## 2023-08-06 ENCOUNTER — Encounter: Payer: Self-pay | Admitting: Internal Medicine

## 2023-08-06 DIAGNOSIS — J189 Pneumonia, unspecified organism: Secondary | ICD-10-CM | POA: Diagnosis not present

## 2023-08-06 DIAGNOSIS — J449 Chronic obstructive pulmonary disease, unspecified: Secondary | ICD-10-CM | POA: Diagnosis not present

## 2023-08-06 NOTE — Assessment & Plan Note (Signed)
BP has been stable including at most recent visit. She does not take BP medication at this time due to diet/weight control. Checking CMP and adjust as needed.

## 2023-08-06 NOTE — Assessment & Plan Note (Signed)
No flare today but recent flare treated with azithromycin and prednisone recently and pulmonary has adjusted her medications. She has good and bad days.

## 2023-08-06 NOTE — Assessment & Plan Note (Signed)
Checking lipid panel and adjust pravastatin 20 mg daily as needed. Goal LDL< 70.

## 2023-08-06 NOTE — Assessment & Plan Note (Signed)
Needs yearly CMP, lipid panel and HgA1c at least. She has no new stroke symptoms. Using gabapentin for post stroke discomfort/nerve pain 600 mg BID which we will continue.

## 2023-08-06 NOTE — Assessment & Plan Note (Signed)
Due for labs including microalbumin to creatinine ratio, HgA1c, CMP, lipid panel which were ordered. Adjust as needed. She is diet controlled. On statin not on ARB/ACE-I.

## 2023-08-22 ENCOUNTER — Encounter (HOSPITAL_BASED_OUTPATIENT_CLINIC_OR_DEPARTMENT_OTHER): Payer: Self-pay | Admitting: Pulmonary Disease

## 2023-08-22 ENCOUNTER — Ambulatory Visit (HOSPITAL_BASED_OUTPATIENT_CLINIC_OR_DEPARTMENT_OTHER): Payer: Medicare HMO | Admitting: Pulmonary Disease

## 2023-08-22 VITALS — BP 122/68 | HR 80 | Resp 16 | Ht 59.0 in | Wt 103.5 lb

## 2023-08-22 DIAGNOSIS — J4489 Other specified chronic obstructive pulmonary disease: Secondary | ICD-10-CM | POA: Diagnosis not present

## 2023-08-22 DIAGNOSIS — G4736 Sleep related hypoventilation in conditions classified elsewhere: Secondary | ICD-10-CM

## 2023-08-22 DIAGNOSIS — J432 Centrilobular emphysema: Secondary | ICD-10-CM

## 2023-08-22 DIAGNOSIS — J9611 Chronic respiratory failure with hypoxia: Secondary | ICD-10-CM

## 2023-08-22 MED ORDER — ALBUTEROL SULFATE HFA 108 (90 BASE) MCG/ACT IN AERS: 1.0000 | INHALATION_SPRAY | RESPIRATORY_TRACT | 3 refills | Status: DC | PRN

## 2023-08-22 MED ORDER — ARFORMOTEROL TARTRATE 15 MCG/2ML IN NEBU: 15.0000 ug | INHALATION_SOLUTION | Freq: Two times a day (BID) | RESPIRATORY_TRACT | 11 refills | Status: DC

## 2023-08-22 MED ORDER — IPRATROPIUM-ALBUTEROL 0.5-2.5 (3) MG/3ML IN SOLN: 3.0000 mL | Freq: Four times a day (QID) | RESPIRATORY_TRACT | 11 refills | Status: DC | PRN

## 2023-08-22 MED ORDER — BUDESONIDE 0.5 MG/2ML IN SUSP: 0.5000 mg | Freq: Two times a day (BID) | RESPIRATORY_TRACT | 11 refills | Status: DC

## 2023-08-22 NOTE — Patient Instructions (Signed)
COPD with emphysema --REFILL nebulizer regimen as follows:  --Pulmicort/Budesonide ONE neb in the morning  --Brovana/aformoterol ONE neb in the morning and evening  --Duoneb/ipratropium-albuterol ONE neb every morning and evening  --CONTINUE Albuterol as needed for shortness of breath or wheezing --Not a candidate for pulmonary rehab right now. No longer working with PT --Not a candidate for chronic prednisone therapy due to ineffectiveness  Nocturnal hypoxemia --CONTINUE 1L O2 nightly for goal >88%. Last recertified 06/2024  GOC --Palliative care offered. Referral placed

## 2023-08-22 NOTE — Progress Notes (Signed)
Synopsis: Referred in 2017 for COPD by Myrlene Broker, *  Subjective:   PATIENT ID: Sherry Lambert GENDER: female DOB: Apr 05, 1941, MRN: [id number]  Chief Complaint  Patient presents with   Follow-up    About the same as last visit. Some days are better than others   [name] is a 82 year old female former smoker with COPD, hx CVA in 2016 who presents for follow-up.   Synopsis: She is a former patient of Dr. Chestine Spore. She was diagnosed with COPD in 2016. Her last COPD exacerbation was one year ago Feb 2021 requiring hospitalization. She transitioned from Trelegy to [facility] in March 2022 due to thrush and reports symptoms remain well-controlled.  12/20/20 Since our last visit, she reports her symptoms are well contolled on Anoro. [name] has resolved off inhaled corticosteroids. After sitting three hours outside, she used her Albuterol yesterday for shortness of breath and nasal congestion and believes this is related to the pollen. Otherwise she does routinely use her rescue inhaler. Denies wheezing or cough.  10/10/21 Since our last visit she is compliant with Anoro. She is ambulatory with a walker at home. In the last two weeks she has had worsening shortness of breath and has had to use albuterol once a day. No coughing or wheezing. No fevers or chills. Her last exacerbation was in 05/2021 which was treated with doxycycline. She will do 30 min exercise twice a week with a TV program and the exercises will vary aerobic to strengthening.  02/26/22 Since our last visit she was hospitalized in June for pneumonia. No evidence of aspiration. Improved on antibiotics. [name] with exertion. No wheezing or cough.  08/16/22 Family is present and provides additional history. In the last several months she has had multiple exacerbations including in September, October and November (Augment/Doxy for pneumonia). Duonebs were added to her regimen. At the end of October was started on  chronic prednisone 5 mg daily by Dr. Tonia Brooms. She continues to have shortness of breath with minimal exertion. Denies cough or wheezing. She is compliant with her meds but does not feel it is effective and not able to inhale properly. Since her stroke she is weak and feels her breathing has declined with her strength.  11/12/22 Present with son who provides additional history. Since our last visit she has called the office to report the combo nebs are not effective and was seen in the ED on 10/29/22. Discharged with doxycycline, no new O2 requirements. Called office after hours on 11/04/22 and prescribed prednisone 40 mg x 5 days. Compliant with her nebulizer regimen but does feel that the steroid nebulizer causes a filmy taste. Son reports she is overall improved on this regimen with only shortness of breath on exertion. No cough or wheezing. Compliant with nocturnal oxygen.   02/11/23 She was doing well with her current nebulizer regimen. No further thrush after reducing Pulmicort to daily. However unable to complete nystatin due to individual packages - needing to cut with knife. In the last two days she is having shortness of breath. Denies wheezing or cough. Denies fevers, chills. Compliant with oxygen. Discussed goals of care and expresses she would want to be DNR. Family present in clinic today.  05/17/23 Since our last visit she feels overall well. Has shortness of breath with exertion. Denies cough, wheezing. On nebulizer regimen as documented below. Can use walker and does standing exercises for 20 min. Able to perform housework including washing dishes, laundry and  some cooking. Showers without assistance. Daughter is working on ramp access to her home.   08/22/23 She reports some good days and bad days. [name]. Denies wheezing or cough. Good days are more often. Treated for COPD exacerbation around Thanksgiving and improved after prednisone. Son reports mobility is less and she is ad  about lack of independence. Her residual symptoms are worsen.   Prior inhalers: Trelegy - thrush  Social history: Quit in 2004. 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   [name]    Cataract    Chronic kidney disease    kidney stone   Colitis    Colon cancer (HCC) 06/2017   cecum   Diabetes mellitus without complication (HCC)    no meds taken   Emphysema of lung (HCC)    GERD (gastroesophageal reflux disease)    Hyperlipidemia    Hypertension    Peripheral arterial disease (HCC)    [name]    Stroke [facility])    November 2016- Thanksgiving night     Allergies  Allergen Reactions   Lipitor [Atorvastatin] Itching     Outpatient Medications Prior to Visit  Medication Sig Dispense Refill   acetaminophen (TYLENOL) 500 MG tablet Take 500 mg by mouth daily.      aspirin EC 81 MG tablet Take 1 tablet (81 mg total) by mouth daily. 90 tablet 3   gabapentin (NEURONTIN) 300 MG capsule Take 2 capsules (600 mg total) by mouth 2 (two) times daily. 360 capsule 3   nystatin (MYCOSTATIN) 100000 UNIT/ML suspension Take 5 mLs (500,000 Units total) by mouth 4 (four) times daily. 140 mL 0   omeprazole (PRILOSEC) 40 MG capsule Take 1 capsule by mouth once daily 90 capsule 0   Polyvinyl Alcohol-Povidone (REFRESH OP) Place 1 drop into both eyes daily as needed (For dry eyes).     pravastatin (PRAVACHOL) 20 MG tablet Take 1 tablet by mouth once daily 90 tablet 3   traMADol (ULTRAM) 50 MG tablet TAKE 1 TABLET BY MOUTH ONCE DAILY AS NEEDED 30 tablet 5   VITAMIN D PO Take 1 tablet by mouth daily.     albuterol (VENTOLIN HFA) 108 (90 Base) MCG/ACT inhaler Inhale 1-2 puffs into the lungs every 4 (four) hours as needed for wheezing or shortness of breath. 18 g 6   arformoterol (BROVANA) 15 MCG/2ML NEBU Take 2 mLs (15 mcg total) by nebulization 2 (two) times daily. 120 mL 5   budesonide (PULMICORT) 0.5 MG/2ML nebulizer solution Take  2 mLs (0.5 mg total) by nebulization in the morning and at bedtime. 120 mL 5   ipratropium-albuterol (DUONEB) 0.5-2.5 (3) MG/3ML SOLN Take 3 mLs by nebulization every 6 (six) hours as needed. 360 mL 5   No facility-administered medications prior to visit.    Review of Systems  Constitutional:  Negative for chills, diaphoresis, fever, malaise/fatigue and weight loss.  HENT:  Negative for congestion.   Respiratory:  Positive for shortness of breath. Negative for cough, hemoptysis, sputum production and wheezing.   Cardiovascular:  Negative for chest pain, palpitations and leg swelling.   Objective:   Vitals:   08/22/23 1544  BP: 122/68  Pulse: 80  Resp: 16  SpO2: 92%  Weight: 103 lb 8 oz (46.9 kg)  Height: 4\' 11"  (1.499 m)   92%  BMI Readings from Last 3 Encounters:  08/22/23 20.90 kg/m  05/17/23 20.82 kg/m  02/19/23 20.00 kg/m  Wt Readings from Last 3 Encounters:  08/22/23 103 lb 8 oz (46.9 kg)  05/17/23 103 lb 1.6 oz (46.8 kg)  02/19/23 99 lb (44.9 kg)   Physical Exam: General: Frail and chronically ill-appearing, no acute distress HENT: [place], AT Eyes: EOMI, no scleral icterus Respiratory: Clear to auscultation bilaterally.  No crackles, wheezing or rales Cardiovascular: RRR, -M/R/G, no JVD Extremities:-Edema,-tenderness Neuro: AAO x4, CNII-XII grossly intact Psych: Normal mood, normal affect   Chest Imaging- films reviewed: CTA Chest 10/27/2019-emphysema, bilateral dependent pleural effusions.  No PE, no significant mediastinal or hilar adenopathy.  Hernia with stomach and abdominal fat contents protruding into the mediastinum. CXR 05/22/21 - COPD. No infiltrate effusion or edema CXR 02/26/22 - Resolution of lower lobe infiltrates  Pulmonary Functions Testing Results:    Latest Ref Rng & Units 11/01/2020    3:05 PM 02/13/2016    1:04 PM  PFT Results  FVC-Pre L 1.82  1.82   FVC-Predicted Pre % 82  76   FVC-Post L 1.80  2.02   FVC-Predicted Post % 81  84   Pre  FEV1/FVC % % 48  45   Post FEV1/FCV % % 46  47   FEV1-Pre L 0.87  0.82   FEV1-Predicted Pre % 52  46   FEV1-Post L 0.83  0.94   DLCO uncorrected ml/min/mmHg 9.89  9.75   DLCO UNC% % 60  51   DLCO corrected ml/min/mmHg 9.89  9.26   DLCO COR %Predicted % 60  49   DLVA Predicted % 80  64   TLC L 5.04    TLC % Predicted % 113    RV % Predicted % 159     2017- severe obstruction with out significant bronchodilator reversibility.  Moderate diffusion impairment.  Flow volume loop supports obstruction.  2019 spirometry: FVC 1.8 (75%) FEV1 0.7 (39%) Ratio 38%  PFT 11/01/20 FVC 1.8 (81%) FEV1 0.83 (50%) Ratio 48  TLC 113% RV 159% DLCO 60% Interpretation: Moderately severe obstructive defect with air trapping and reduced DLCO consistent with emphysema.  ONO study 03/02/22 - SpO2 <88% 15 min 44 seconds. 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 Multiple exacerbations including in September, October and November 2023 and Feb and March 2024 and June 2024 and November 2024. Stable on nebulizers. Discussed clinical course and management of COPD including bronchodilator regimen, preventive care including vaccinations and action plan for exacerbation.  COPD with emphysema -symptomatic but controlled --REFILL nebulizer regimen as follows:  --Pulmicort/Budesonide ONE neb in the morning  --Brovana/aformoterol ONE neb in the morning and evening  --Duoneb/ipratropium-albuterol ONE neb every morning and evening  --CONTINUE Albuterol as needed for shortness of breath or wheezing --Not a candidate for pulmonary rehab right now. No longer working with PT --Not a candidate for chronic prednisone therapy due to ineffectiveness  Nocturnal hypoxemia --CONTINUE 1L O2 nightly for goal >88%. Last recertified 06/2024  GOC --Palliative care offered. Referral placed --Previously discussed considering what it means to need mechanical  ventilation or CPR --Encouraged to continue discussions with family.  Health Maintenance Immunization History  Administered Date(s) Administered   Fluad Quad(high Dose 65+) 06/18/2019, 07/13/2020, 07/19/2021, 07/02/2022   Influenza, High Dose Seasonal PF 07/27/2013, 05/14/2016, 05/20/2017, 06/05/2018   Influenza,inj,Quad PF,6+ Mos 08/03/2014, 06/21/2015   Influenza,inj,quad, With Preservative 06/04/2019   Influenza-Unspecified 07/04/2012, 06/18/2022, 07/22/2023   PFIZER(Purple Top)SARS-COV-2 Vaccination 11/17/2019, 12/08/2019, 08/17/2020   Pneumococcal Conjugate-13 11/11/2015  Pneumococcal-Unspecified 09/03/2010   Td 09/03/2012   CT Lung Screening - not a candidate. Quit smoking > 15 years ago  Orders Placed This Encounter  Procedures   Amb Referral to Palliative Care    Referral Priority:   Routine    Referral Type:   Consultation    Number of Visits Requested:   1   Meds ordered this encounter  Medications   arformoterol (BROVANA) 15 MCG/2ML NEBU    Sig: Take 2 mLs (15 mcg total) by nebulization 2 (two) times daily.    Dispense:  120 mL    Refill:  11   budesonide (PULMICORT) 0.5 MG/2ML nebulizer solution    Sig: Take 2 mLs (0.5 mg total) by nebulization in the morning and at bedtime.    Dispense:  120 mL    Refill:  11   ipratropium-albuterol (DUONEB) 0.5-2.5 (3) MG/3ML SOLN    Sig: Take 3 mLs by nebulization every 6 (six) hours as needed.    Dispense:  360 mL    Refill:  11    Dx: J43.2   albuterol (VENTOLIN HFA) 108 (90 Base) MCG/ACT inhaler    Sig: Inhale 1-2 puffs into the lungs every 4 (four) hours as needed for wheezing or shortness of breath.    Dispense:  18 g    Refill:  3   I have spent a total time of 32-minutes on the day of the appointment including chart review, data review, collecting history, coordinating care and discussing medical diagnosis and plan with the patient/family. Past medical history, allergies, medications were reviewed. Pertinent  imaging, labs and tests included in this note have been reviewed and interpreted independently by me.  Jabarri Stefanelli Mechele Collin, MD [facility] 08/22/2023 4:19 PM

## 2023-08-28 DIAGNOSIS — J189 Pneumonia, unspecified organism: Secondary | ICD-10-CM | POA: Diagnosis not present

## 2023-08-28 DIAGNOSIS — J449 Chronic obstructive pulmonary disease, unspecified: Secondary | ICD-10-CM | POA: Diagnosis not present

## 2023-08-30 ENCOUNTER — Ambulatory Visit (INDEPENDENT_AMBULATORY_CARE_PROVIDER_SITE_OTHER): Payer: Medicare HMO

## 2023-08-30 ENCOUNTER — Other Ambulatory Visit (INDEPENDENT_AMBULATORY_CARE_PROVIDER_SITE_OTHER): Payer: Medicare HMO

## 2023-08-30 ENCOUNTER — Other Ambulatory Visit: Payer: Self-pay | Admitting: Internal Medicine

## 2023-08-30 DIAGNOSIS — E118 Type 2 diabetes mellitus with unspecified complications: Secondary | ICD-10-CM | POA: Diagnosis not present

## 2023-08-30 DIAGNOSIS — Z0000 Encounter for general adult medical examination without abnormal findings: Secondary | ICD-10-CM

## 2023-08-30 LAB — LIPID PANEL
Cholesterol: 159 mg/dL (ref 0–200)
HDL: 75.4 mg/dL (ref >39.00)
LDL Cholesterol: 70 mg/dL (ref 0–99)
NonHDL: 83.62
Total CHOL/HDL Ratio: 2
Triglycerides: 69 mg/dL (ref 0.0–149.0)
VLDL: 13.8 mg/dL (ref 0.0–40.0)

## 2023-08-30 LAB — COMPREHENSIVE METABOLIC PANEL
ALT: 10 U/L (ref 0–35)
AST: 19 U/L (ref 0–37)
Albumin: 4 g/dL (ref 3.5–5.2)
Alkaline Phosphatase: 51 U/L (ref 39–117)
BUN: 15 mg/dL (ref 6–23)
CO2: 27 meq/L (ref 19–32)
Calcium: 9.4 mg/dL (ref 8.4–10.5)
Chloride: 104 meq/L (ref 96–112)
Creatinine, Ser: 0.71 mg/dL (ref 0.40–1.20)
GFR: 79.86 mL/min (ref >60.00)
Glucose, Bld: 179 mg/dL — ABNORMAL HIGH (ref 70–99)
Potassium: 3.5 meq/L (ref 3.5–5.1)
Sodium: 139 meq/L (ref 135–145)
Total Bilirubin: 0.4 mg/dL (ref 0.2–1.2)
Total Protein: 6.5 g/dL (ref 6.0–8.3)

## 2023-08-30 LAB — CBC
HCT: 36.9 % (ref 36.0–46.0)
Hemoglobin: 12 g/dL (ref 12.0–15.0)
MCHC: 32.6 g/dL (ref 30.0–36.0)
MCV: 92.8 fL (ref 78.0–100.0)
Platelets: 238 10*3/uL (ref 150.0–400.0)
RBC: 3.97 Mil/uL (ref 3.87–5.11)
RDW: 13.5 % (ref 11.5–15.5)
WBC: 8 10*3/uL (ref 4.0–10.5)

## 2023-08-30 LAB — HEMOGLOBIN A1C: Hgb A1c MFr Bld: 6 % (ref 4.6–6.5)

## 2023-08-30 NOTE — Patient Instructions (Signed)
[name] , Thank you for taking time to come for your Medicare Wellness Visit. I appreciate your ongoing commitment to your health goals. Please review the following plan we discussed and let me know if I can assist you in the future.   Referrals/Orders/Follow-Ups/Clinician Recommendations: none  This is a list of the screening recommended for you and due dates:  Health Maintenance  Topic Date Due   Zoster (Shingles) Vaccine (1 of 2) Never done   Pneumonia Vaccine (2 of 2 - PPSV23 or PCV20) 01/06/2016   Eye exam for diabetics  09/17/2016   DTaP/Tdap/Td vaccine (2 - Tdap) 09/03/2022   COVID-19 Vaccine (4 - 2024-25 season) 05/05/2023   Yearly kidney health urinalysis for diabetes  08/01/2023   Complete foot exam   08/01/2023   Hemoglobin A1C  08/21/2023   DEXA scan (bone density measurement)  08/29/2024*   Yearly kidney function blood test for diabetes  10/30/2023   Medicare Annual Wellness Visit  08/29/2024   Flu Shot  Completed   HPV Vaccine  Aged Out  *Topic was postponed. The date shown is not the original due date.    Advanced directives: (Copy Requested) Please bring a copy of your health care power of attorney and living will to the office to be added to your chart at your convenience.  Next Medicare Annual Wellness Visit scheduled for next year: No, will make next year  Insert Preventive Care attachment Insert FALL PREVENTION attachment if needed

## 2023-08-30 NOTE — Progress Notes (Signed)
Subjective:   Sherry Lambert is a 82 y.o. female who presents for Medicare Annual (Subsequent) preventive examination.  Visit Complete: Virtual I connected with  Sherry Lambert on 08/30/23 by a audio enabled telemedicine application and verified that I am speaking with the correct person using two identifiers. Son was also on the call.  Patient Location: Home  Provider Location: Home Office  I discussed the limitations of evaluation and management by telemedicine. The patient expressed understanding and agreed to proceed.  Vital Signs: Because this visit was a virtual/telehealth visit, some criteria may be missing or patient reported. Any vitals not documented were not able to be obtained and vitals that have been documented are patient reported.    Cardiac Risk Factors include: advanced age (>46men, >55 women);diabetes mellitus;dyslipidemia;hypertension     Objective:    Today's Vitals   08/30/23 1551  PainSc: 3    There is no height or weight on file to calculate BMI.     08/30/2023    3:59 PM 10/29/2022    2:38 PM 08/15/2022   10:27 AM 07/15/2022   10:40 AM 02/05/2022    5:06 AM 02/04/2022    3:47 PM 08/14/2021    1:51 PM  Advanced Directives  Does Patient Have a Medical Advance Directive? Yes No No No No No No  Type of Estate agent of [place];Living will        Copy of Healthcare Power of Attorney in Chart? No - copy requested        Would patient like information on creating a medical advance directive?  No - Patient declined No - Patient declined  No - Patient declined No - Patient declined No - Patient declined    Current Medications (verified) Outpatient Encounter Medications as of 08/30/2023  Medication Sig   acetaminophen (TYLENOL) 500 MG tablet Take 500 mg by mouth daily.    albuterol (VENTOLIN HFA) 108 (90 Base) MCG/ACT inhaler Inhale 1-2 puffs into the lungs every 4 (four) hours as needed for wheezing or shortness of breath.    arformoterol (BROVANA) 15 MCG/2ML NEBU Take 2 mLs (15 mcg total) by nebulization 2 (two) times daily.   aspirin EC 81 MG tablet Take 1 tablet (81 mg total) by mouth daily.   budesonide (PULMICORT) 0.5 MG/2ML nebulizer solution Take 2 mLs (0.5 mg total) by nebulization in the morning and at bedtime.   gabapentin (NEURONTIN) 300 MG capsule Take 2 capsules (600 mg total) by mouth 2 (two) times daily.   ipratropium-albuterol (DUONEB) 0.5-2.5 (3) MG/3ML SOLN Take 3 mLs by nebulization every 6 (six) hours as needed.   nystatin (MYCOSTATIN) 100000 UNIT/ML suspension Take 5 mLs (500,000 Units total) by mouth 4 (four) times daily.   omeprazole (PRILOSEC) 40 MG capsule Take 1 capsule by mouth once daily   Polyvinyl Alcohol-Povidone (REFRESH OP) Place 1 drop into both eyes daily as needed (For dry eyes).   pravastatin (PRAVACHOL) 20 MG tablet Take 1 tablet by mouth once daily   traMADol (ULTRAM) 50 MG tablet TAKE 1 TABLET BY MOUTH ONCE DAILY AS NEEDED   VITAMIN D PO Take 1 tablet by mouth daily.   No facility-administered encounter medications on file as of 08/30/2023.    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   [name]    Cataract    Chronic kidney disease    kidney stone   Colitis  Colon cancer (HCC) 06/2017   cecum   Diabetes mellitus without complication (HCC)    no meds taken   Emphysema of lung (HCC)    GERD (gastroesophageal reflux disease)    Hyperlipidemia    Hypertension    Peripheral arterial disease (HCC)    [name]    Stroke [facility])    November 2016- Thanksgiving night   Past Surgical History:  Procedure Laterality Date   ABDOMINAL HYSTERECTOMY     BALLOON DILATION N/A 12/23/2012   Procedure: BALLOON DILATION;  Surgeon: Louis Meckel, MD;  Location: [facility];  Service: Endoscopy;  Laterality: N/A;   BALLOON DILATION N/A 01/15/2013   Procedure: BALLOON  DILATION;  Surgeon: Louis Meckel, MD;  Location: [facility];  Service: Endoscopy;  Laterality: N/A;   BALLOON DILATION N/A 07/22/2018   Procedure: BALLOON DILATION;  Surgeon: Napoleon Form, MD;  Location: [facility];  Service: Endoscopy;  Laterality: N/A;   cataract     both eyes   COLONOSCOPY     COLONOSCOPY WITH PROPOFOL N/A 03/05/2017   Procedure: COLONOSCOPY WITH PROPOFOL;  Surgeon: Napoleon Form, MD;  Location: [facility];  Service: Endoscopy;  Laterality: N/A;   DENTAL RESTORATION/EXTRACTION WITH X-RAY     ESOPHAGOGASTRODUODENOSCOPY N/A 12/23/2012   Procedure: ESOPHAGOGASTRODUODENOSCOPY (EGD);  Surgeon: Louis Meckel, MD;  Location: [facility];  Service: Endoscopy;  Laterality: N/A;   ESOPHAGOGASTRODUODENOSCOPY N/A 01/15/2013   Procedure: ESOPHAGOGASTRODUODENOSCOPY (EGD);  Surgeon: Louis Meckel, MD;  Location: [facility];  Service: Endoscopy;  Laterality: N/A;   ESOPHAGOGASTRODUODENOSCOPY (EGD) WITH PROPOFOL N/A 07/22/2018   Procedure: ESOPHAGOGASTRODUODENOSCOPY (EGD) WITH PROPOFOL;  Surgeon: Napoleon Form, MD;  Location: [facility];  Service: Endoscopy;  Laterality: N/A;   TONSILLECTOMY     Family History  Problem Relation Age of Onset   Hypertension Father    Diabetes Father    Cancer Father        melanoma   Emphysema Mother    Colon cancer Sister 93   Cancer Sister        colon, surgery alone    Emphysema Sister    Early death Neg Hx    Stroke Neg Hx    Esophageal cancer Neg Hx    Rectal cancer Neg Hx    Stomach cancer Neg Hx    Social History   Socioeconomic History   Marital status: Divorced    Spouse name: Not on file   Number of children: 1   Years of education: Not on file   Highest education level: Not on file  Occupational History   Occupation: Retired    Comment: hosier [employer]  Tobacco Use   Smoking status: Former    Current packs/day: 0.00    Average packs/day: 1 pack/day for 55.0 years (55.0 ttl pk-yrs)    Types:  Cigarettes    Start date: 10/20/1947    Quit date: 10/19/2002    Years since quitting: 20.8   Smokeless tobacco: Never  Vaping Use   Vaping status: Never Used  Substance and Sexual Activity   Alcohol use: No    Alcohol/week: 0.0 standard drinks of alcohol   Drug use: No   Sexual activity: Never    Birth control/protection: Surgical  Other Topics Concern   Not on file  Social History Narrative   Regular exercise-no   Caffeine Use-yes   Social Drivers of Health   Financial Resource Strain: Low Risk  (08/30/2023)   Overall Financial Resource Strain (CARDIA)  Difficulty of Paying Living Expenses: Not hard at all  Food Insecurity: No Food Insecurity (08/30/2023)   Hunger Vital Sign    Worried About Running Out of Food in the Last Year: Never true    Ran Out of Food in the Last Year: Never true  Transportation Needs: No Transportation Needs (08/30/2023)   PRAPARE - Administrator, Civil Service (Medical): No    Lack of Transportation (Non-Medical): No  Physical Activity: Insufficiently Active (08/30/2023)   Exercise Vital Sign    Days of Exercise per Week: 7 days    Minutes of Exercise per Session: 10 min  Stress: No Stress Concern Present (08/30/2023)   [employer] of Occupational Health - Occupational Stress Questionnaire    Feeling of Stress : Not at all  Social Connections: Moderately Isolated (08/30/2023)   Social Connection and Isolation Panel [NHANES]    Frequency of Communication with Friends and Family: More than three times a week    Frequency of Social Gatherings with Friends and Family: More than three times a week    Attends Religious Services: More than 4 times per year    Active Member of [employer] or Organizations: No    Attends Engineer, structural: Never    Marital Status: Divorced    Tobacco Counseling Counseling given: Not Answered   Clinical Intake:  Pre-visit preparation completed: Yes  Pain : 0-10 Pain Score: 3  Pain  Type: Acute pain Pain Location: Abdomen Pain Descriptors / Indicators: Aching Pain Onset: More than a month ago Pain Frequency: Intermittent     Nutritional Risks: None Diabetes: Yes CBG done?: No Did pt. bring in CBG monitor from home?: No  How often do you need to have someone help you when you read instructions, pamphlets, or other written materials from your doctor or pharmacy?: 1 - Never  Interpreter Needed?: No  Information entered by :: NAllen LPN   Activities of Daily Living    08/30/2023    3:54 PM  In your present state of health, do you have any difficulty performing the following activities:  Hearing? 1  Comment no hearing aids  Vision? 0  Difficulty concentrating or making decisions? 1  Walking or climbing stairs? 1  Dressing or bathing? 0  Doing errands, shopping? 1  Comment son goes with her  Preparing Food and eating ? N  Using the Toilet? N  In the past six months, have you accidently leaked urine? Y  Comment not new  Do you have problems with loss of bowel control? N  Managing your Medications? N  Managing your Finances? N  Housekeeping or managing your Housekeeping? N    Patient Care Team: Myrlene Broker, MD as PCP - General (Internal Medicine) Kathyrn Sheriff, [facility] (Inactive) as Pharmacist (Pharmacist) [facility]  Indicate any recent Medical Services you may have received from other than [facility] providers in the past year (date may be approximate).     Assessment:   This is a routine wellness examination for Sharmika.  Hearing/Vision screen Hearing Screening - Comments:: Has hearing trouble but no hearing aids Vision Screening - Comments:: No regular eye exams   Goals Addressed             This Visit's Progress    Patient Stated       08/30/2023, denies goals       Depression Screen    08/30/2023    4:00 PM 02/19/2023    3:34 PM  08/15/2022   10:20 AM 07/12/2022    9:58 AM 02/13/2022    8:40 AM  08/14/2021    2:01 PM 08/14/2021    1:57 PM  PHQ 2/9 Scores  PHQ - 2 Score 4 0 0 0 0 0 0  PHQ- 9 Score 10 0   0      Fall Risk    08/30/2023    3:59 PM 08/05/2023    3:49 PM 02/19/2023    3:34 PM 08/15/2022   10:29 AM 07/12/2022    9:58 AM  Fall Risk   Falls in the past year? 0 0 0 1 1  Number falls in past yr: 0 0 0 1 1  Injury with Fall? 0 0 0 1 1  Risk for fall due to : Medication side effect;Impaired mobility;Impaired balance/gait   History of fall(s);Impaired balance/gait;Impaired mobility Impaired balance/gait;Impaired mobility  Follow up Falls prevention discussed;Falls evaluation completed Falls evaluation completed Falls evaluation completed Falls evaluation completed;Education provided Falls evaluation completed    MEDICARE RISK AT HOME: Medicare Risk at Home Any stairs in or around the home?: No If so, are there any without handrails?: No Home free of loose throw rugs in walkways, pet beds, electrical cords, etc?: Yes Adequate lighting in your home to reduce risk of falls?: Yes Life alert?: No Use of a cane, walker or w/c?: Yes Grab bars in the bathroom?: Yes Shower chair or bench in shower?: Yes Elevated toilet seat or a handicapped toilet?: Yes  TIMED UP AND GO:  Was the test performed?  No    Cognitive Function:  6 CIT not administered. Patient was having difficulty hearing me.         08/15/2022   10:28 AM  6CIT Screen  What Year? 0 points  What month? 0 points  What time? 0 points  Count back from 20 0 points  Months in reverse 0 points  Repeat phrase 2 points  Total Score 2 points    Immunizations Immunization History  Administered Date(s) Administered   Fluad Quad(high Dose 65+) 06/18/2019, 07/13/2020, 07/19/2021, 07/02/2022   Influenza, High Dose Seasonal PF 07/27/2013, 05/14/2016, 05/20/2017, 06/05/2018   Influenza,inj,Quad PF,6+ Mos 08/03/2014, 06/21/2015   Influenza,inj,quad, With Preservative 06/04/2019   Influenza-Unspecified  07/04/2012, 06/18/2022, 07/22/2023   PFIZER(Purple Top)SARS-COV-2 Vaccination 11/17/2019, 12/08/2019, 08/17/2020   Pneumococcal Conjugate-13 11/11/2015   Pneumococcal-Unspecified 09/03/2010   Td 09/03/2012    TDAP status: Due, Education has been provided regarding the importance of this vaccine. Advised may receive this vaccine at local pharmacy or Health Dept. Aware to provide a copy of the vaccination record if obtained from local pharmacy or Health Dept. Verbalized acceptance and understanding.  Flu Vaccine status: Up to date  Pneumococcal vaccine status: Due, Education has been provided regarding the importance of this vaccine. Advised may receive this vaccine at local pharmacy or Health Dept. Aware to provide a copy of the vaccination record if obtained from local pharmacy or Health Dept. Verbalized acceptance and understanding.  Covid-19 vaccine status: Information provided on how to obtain vaccines.   Qualifies for Shingles Vaccine? Yes   Zostavax completed No   Shingrix Completed?: No.    Education has been provided regarding the importance of this vaccine. Patient has been advised to call insurance company to determine out of pocket expense if they have not yet received this vaccine. Advised may also receive vaccine at local pharmacy or Health Dept. Verbalized acceptance and understanding.  Screening Tests Health Maintenance  Topic Date Due  Zoster Vaccines- Shingrix (1 of 2) Never done   Pneumonia Vaccine 59+ Years old (2 of 2 - PPSV23 or PCV20) 01/06/2016   OPHTHALMOLOGY EXAM  09/17/2016   DTaP/Tdap/Td (2 - Tdap) 09/03/2022   COVID-19 Vaccine (4 - 2024-25 season) 05/05/2023   Diabetic kidney evaluation - Urine ACR  08/01/2023   FOOT EXAM  08/01/2023   HEMOGLOBIN A1C  08/21/2023   DEXA SCAN  08/29/2024 (Originally 06/12/2007)   Diabetic kidney evaluation - eGFR measurement  10/30/2023   Medicare Annual Wellness (AWV)  08/29/2024   INFLUENZA VACCINE  Completed   HPV  VACCINES  Aged Out    Health Maintenance  Health Maintenance Due  Topic Date Due   Zoster Vaccines- Shingrix (1 of 2) Never done   Pneumonia Vaccine 5+ Years old (2 of 2 - PPSV23 or PCV20) 01/06/2016   OPHTHALMOLOGY EXAM  09/17/2016   DTaP/Tdap/Td (2 - Tdap) 09/03/2022   COVID-19 Vaccine (4 - 2024-25 season) 05/05/2023   Diabetic kidney evaluation - Urine ACR  08/01/2023   FOOT EXAM  08/01/2023   HEMOGLOBIN A1C  08/21/2023    Colorectal cancer screening: No longer required.   Mammogram status: No longer required due to age.  Bone Density status: declined  Lung Cancer Screening: (Low Dose CT Chest recommended if Age 68-80 years, 20 pack-year currently smoking OR have quit w/in 15years.) does not qualify.   Lung Cancer Screening Referral: no  Additional Screening:  Hepatitis C Screening: does not qualify;   Vision Screening: Recommended annual ophthalmology exams for early detection of glaucoma and other disorders of the eye. Is the patient up to date with their annual eye exam?  No  Who is the provider or what is the name of the office in which the patient attends annual eye exams? none If pt is not established with a provider, would they like to be referred to a provider to establish care? No .   Dental Screening: Recommended annual dental exams for proper oral hygiene  Diabetic Foot Exam: n/a  Community Resource Referral / Chronic Care Management: CRR required this visit?  No   CCM required this visit?  No     Plan:     I have personally reviewed and noted the following in the patient's chart:   Medical and social history Use of alcohol, tobacco or illicit drugs  Current medications and supplements including opioid prescriptions. Patient is not currently taking opioid prescriptions. Functional ability and status Nutritional status Physical activity Advanced directives List of other physicians Hospitalizations, surgeries, and ER visits in previous 12  months Vitals Screenings to include cognitive, depression, and falls Referrals and appointments  In addition, I have reviewed and discussed with patient certain preventive protocols, quality metrics, and best practice recommendations. A written personalized care plan for preventive services as well as general preventive health recommendations were provided to patient.     Barb Merino, LPN   16/06/9603   After Visit Summary: (MyChart) Due to this being a telephonic visit, the after visit summary with patients personalized plan was offered to patient via MyChart   Nurse Notes: none

## 2023-09-06 DIAGNOSIS — J449 Chronic obstructive pulmonary disease, unspecified: Secondary | ICD-10-CM | POA: Diagnosis not present

## 2023-09-06 DIAGNOSIS — J189 Pneumonia, unspecified organism: Secondary | ICD-10-CM | POA: Diagnosis not present

## 2023-09-12 ENCOUNTER — Ambulatory Visit (INDEPENDENT_AMBULATORY_CARE_PROVIDER_SITE_OTHER): Payer: Medicare HMO | Admitting: Internal Medicine

## 2023-09-12 ENCOUNTER — Encounter: Payer: Self-pay | Admitting: Internal Medicine

## 2023-09-12 VITALS — BP 160/80 | HR 77 | Temp 97.6°F | Ht 59.0 in

## 2023-09-12 DIAGNOSIS — R1011 Right upper quadrant pain: Secondary | ICD-10-CM | POA: Diagnosis not present

## 2023-09-12 DIAGNOSIS — Z23 Encounter for immunization: Secondary | ICD-10-CM

## 2023-09-12 NOTE — Progress Notes (Signed)
   Subjective:   Patient ID: Sherry Lambert, female    DOB: April 01, 1941, 83 y.o.   MRN: [id number]  HPI The patient is an 83 YO female coming in for right upper stomach discomfort occasionally over the last few months. Lasts a few minutes when it happens and no pattern. No change in breathing. No fevers or chills. No diarrhea or constipation.   Review of Systems  Constitutional: Negative.   HENT: Negative.    Eyes: Negative.   Respiratory:  Negative for cough, chest tightness and shortness of breath.   Cardiovascular:  Negative for chest pain, palpitations and leg swelling.  Gastrointestinal:  Positive for abdominal pain. Negative for abdominal distention, constipation, diarrhea, nausea and vomiting.  Musculoskeletal: Negative.   Skin: Negative.   Neurological: Negative.   Psychiatric/Behavioral: Negative.      Objective:  Physical Exam Constitutional:      Appearance: She is well-developed.  HENT:     Head: Normocephalic and atraumatic.  Cardiovascular:     Rate and Rhythm: Normal rate and regular rhythm.  Pulmonary:     Effort: Pulmonary effort is normal. No respiratory distress.     Breath sounds: Normal breath sounds. No wheezing or rales.  Abdominal:     General: Bowel sounds are normal. There is no distension.     Palpations: Abdomen is soft.     Tenderness: There is no abdominal tenderness. There is no rebound.  Musculoskeletal:     Cervical back: Normal range of motion.  Skin:    General: Skin is warm and dry.  Neurological:     Mental Status: She is alert and oriented to person, place, and time.     Coordination: Coordination normal.     Vitals:   09/12/23 1007 09/12/23 1010  BP: (!) 160/80 (!) 160/80  Pulse: 77   Temp: 97.6 F (36.4 C)   TempSrc: Oral   SpO2: 96%   Height: 4' 11 (1.499 m)     Assessment & Plan:  Visit time 15 minutes in face to face communication with patient and coordination of care, additional 5 minutes spent in record review,  coordination or care, ordering tests, communicating/referring to other healthcare professionals, documenting in medical records all on the same day of the visit for total time 20 minutes spent on the visit.

## 2023-09-12 NOTE — Assessment & Plan Note (Signed)
 Suspect that this could be from past right rib fractures. She is getting rare pain that lasts a short time. She is not having pain today. She has stopped constipation medicine regularly when this started. I have asked her to resume this daily to help.

## 2023-09-28 DIAGNOSIS — J189 Pneumonia, unspecified organism: Secondary | ICD-10-CM | POA: Diagnosis not present

## 2023-09-28 DIAGNOSIS — J449 Chronic obstructive pulmonary disease, unspecified: Secondary | ICD-10-CM | POA: Diagnosis not present

## 2023-10-07 DIAGNOSIS — J189 Pneumonia, unspecified organism: Secondary | ICD-10-CM | POA: Diagnosis not present

## 2023-10-07 DIAGNOSIS — J449 Chronic obstructive pulmonary disease, unspecified: Secondary | ICD-10-CM | POA: Diagnosis not present

## 2023-10-09 ENCOUNTER — Telehealth: Payer: Self-pay

## 2023-10-09 NOTE — Telephone Encounter (Signed)
 Copied from CRM [id number]. Topic: Clinical - Medical Advice >> Oct 09, 2023 12:28 PM Sherry Lambert F wrote: Reason for CRM: Patient would like to know how she can get a wheelchair?

## 2023-10-10 NOTE — Telephone Encounter (Signed)
 I don't see what kind of wheelchair she is asking for manual or power

## 2023-10-10 NOTE — Telephone Encounter (Signed)
 Called and LVM. 1st attempt.

## 2023-10-10 NOTE — Telephone Encounter (Unsigned)
 Copied from CRM [id number]. Topic: General - Other >> Oct 10, 2023 10:30 AM Sherry Lambert wrote: Reason for CRM: Patient stated she would like a powered wheelchair & also would like for someone to give a call and explain where the wheelchair is coming from or if she would have to have someone pick up / please call [phone]

## 2023-10-10 NOTE — Telephone Encounter (Signed)
**Note De-identified  [place] Obfuscation** Please advise 

## 2023-10-11 NOTE — Telephone Encounter (Signed)
 Please inform process for power wheelchair with PT evaluation completed for this purpose, then visit with Dr Rollene to review findings and this typically takes months to complete in order to get approved through insurance. If she desires just to purchase this she can do so through a home health company without this process. Let me know how they wish to proceed.

## 2023-10-16 ENCOUNTER — Ambulatory Visit: Payer: Medicare HMO | Admitting: Internal Medicine

## 2023-10-22 ENCOUNTER — Other Ambulatory Visit: Payer: Self-pay | Admitting: Internal Medicine

## 2023-10-29 DIAGNOSIS — J449 Chronic obstructive pulmonary disease, unspecified: Secondary | ICD-10-CM | POA: Diagnosis not present

## 2023-10-29 DIAGNOSIS — J189 Pneumonia, unspecified organism: Secondary | ICD-10-CM | POA: Diagnosis not present

## 2023-10-30 ENCOUNTER — Ambulatory Visit (HOSPITAL_BASED_OUTPATIENT_CLINIC_OR_DEPARTMENT_OTHER): Payer: Medicare HMO | Admitting: Pulmonary Disease

## 2023-11-04 DIAGNOSIS — J189 Pneumonia, unspecified organism: Secondary | ICD-10-CM | POA: Diagnosis not present

## 2023-11-04 DIAGNOSIS — J449 Chronic obstructive pulmonary disease, unspecified: Secondary | ICD-10-CM | POA: Diagnosis not present

## 2023-11-05 ENCOUNTER — Other Ambulatory Visit: Payer: Self-pay | Admitting: Internal Medicine

## 2023-11-05 ENCOUNTER — Telehealth (HOSPITAL_BASED_OUTPATIENT_CLINIC_OR_DEPARTMENT_OTHER): Payer: Self-pay | Admitting: Pulmonary Disease

## 2023-11-05 MED ORDER — PREDNISONE 10 MG PO TABS: ORAL_TABLET | ORAL | 0 refills | Status: AC

## 2023-11-05 NOTE — Telephone Encounter (Signed)
 Patient is requesting prednisone. Patient states not feeling well and is not breathing well. Patient declined appointment and nurse triage cannot call in prescriptions.  Routing to our triage.  Pharmacy: [facility] in [place]  Please advise.

## 2023-11-05 NOTE — Telephone Encounter (Signed)
 Pt states she has no fever or cough, just short of breath that started on Sunday no sick contacts that she knows of. Patient wants Dr Everardo All to call her in some Prednisone I did advise she was in the ICU and may not respond today

## 2023-11-05 NOTE — Telephone Encounter (Signed)
 [facility] Telephone Encounter  She reports worsening shortness of breath for the last 3 days similar to her prior COPD exacerbations. No recent sick contacts but has been in the community. Denies fevers, chills, body aches.  COPD exacerbation Prednisone taper ordered at preferred pharmacy Advised to contact us if symptoms not improved in 48-72 hours Advised to present to ED if respiratory distress

## 2023-11-06 ENCOUNTER — Other Ambulatory Visit: Payer: Self-pay | Admitting: Internal Medicine

## 2023-11-26 DIAGNOSIS — J189 Pneumonia, unspecified organism: Secondary | ICD-10-CM | POA: Diagnosis not present

## 2023-11-26 DIAGNOSIS — J449 Chronic obstructive pulmonary disease, unspecified: Secondary | ICD-10-CM | POA: Diagnosis not present

## 2023-12-02 ENCOUNTER — Other Ambulatory Visit: Payer: Self-pay | Admitting: Internal Medicine

## 2023-12-05 DIAGNOSIS — J449 Chronic obstructive pulmonary disease, unspecified: Secondary | ICD-10-CM | POA: Diagnosis not present

## 2023-12-05 DIAGNOSIS — J189 Pneumonia, unspecified organism: Secondary | ICD-10-CM | POA: Diagnosis not present

## 2023-12-10 ENCOUNTER — Other Ambulatory Visit (HOSPITAL_BASED_OUTPATIENT_CLINIC_OR_DEPARTMENT_OTHER): Payer: Self-pay

## 2023-12-10 ENCOUNTER — Encounter (HOSPITAL_BASED_OUTPATIENT_CLINIC_OR_DEPARTMENT_OTHER): Payer: Self-pay | Admitting: Pulmonary Disease

## 2023-12-10 ENCOUNTER — Ambulatory Visit (HOSPITAL_BASED_OUTPATIENT_CLINIC_OR_DEPARTMENT_OTHER): Payer: Medicare HMO | Admitting: Pulmonary Disease

## 2023-12-10 VITALS — BP 116/74 | HR 60 | Ht 59.0 in | Wt 105.0 lb

## 2023-12-10 DIAGNOSIS — Z7189 Other specified counseling: Secondary | ICD-10-CM

## 2023-12-10 DIAGNOSIS — J449 Chronic obstructive pulmonary disease, unspecified: Secondary | ICD-10-CM | POA: Diagnosis not present

## 2023-12-10 DIAGNOSIS — J9611 Chronic respiratory failure with hypoxia: Secondary | ICD-10-CM | POA: Diagnosis not present

## 2023-12-10 DIAGNOSIS — J189 Pneumonia, unspecified organism: Secondary | ICD-10-CM | POA: Diagnosis not present

## 2023-12-10 MED ORDER — PREDNISONE 10 MG PO TABS
ORAL_TABLET | ORAL | 0 refills | Status: AC
  Filled 2023-12-10: qty 20, 8d supply, fill #0

## 2023-12-10 NOTE — Patient Instructions (Signed)
 COPD with emphysema -symptomatic but controlled --REFILL nebulizer regimen as follows:  --Pulmicort/Budesonide ONE neb in the morning  --Brovana/aformoterol ONE neb in the morning and evening  --Duoneb/ipratropium-albuterol ONE neb every morning and evening  --CONTINUE Albuterol as needed for shortness of breath or wheezing --Not a candidate for pulmonary rehab right now. No longer working with PT --She would consider chronic prednisone in the future  > Prednisone pack ordered for when worsening shortness of breath, cough and wheezing  Nocturnal hypoxemia --CONTINUE 1L O2 nightly for goal >88%. Last recertified 06/2024  GOC --Palliative care following. --Discussed mechanical ventilation or CPR. Patient wishes to be DNR/DNI --Code status updated to DNR/DNI

## 2023-12-10 NOTE — Progress Notes (Signed)
 Synopsis: Referred in 2017 for COPD by Myrlene Broker, *  Subjective:   PATIENT ID: Sherry Lambert GENDER: female DOB: Dec 21, 1940, MRN: [id number]  Chief Complaint  Patient presents with   Follow-up    Centrilobular emphysema, COPD, Nocturnal hypoxemia   [name] is a 83 year old female former smoker with COPD, hx CVA in 2016 who presents for follow-up.   Synopsis: She is a former patient of Dr. Chestine Spore. She was diagnosed with COPD in 2016. Her last COPD exacerbation was one year ago Feb 2021 requiring hospitalization. She transitioned from Trelegy to [employer] in March 2022 due to thrush and reports symptoms remain well-controlled.  12/20/20 Since our last visit, she reports her symptoms are well contolled on Anoro. [name] has resolved off inhaled corticosteroids. After sitting three hours outside, she used her Albuterol yesterday for shortness of breath and nasal congestion and believes this is related to the pollen. Otherwise she does routinely use her rescue inhaler. Denies wheezing or cough.  10/10/21 Since our last visit she is compliant with Anoro. She is ambulatory with a walker at home. In the last two weeks she has had worsening shortness of breath and has had to use albuterol once a day. No coughing or wheezing. No fevers or chills. Her last exacerbation was in 05/2021 which was treated with doxycycline. She will do 30 min exercise twice a week with a TV program and the exercises will vary aerobic to strengthening.  02/26/22 Since our last visit she was hospitalized in June for pneumonia. No evidence of aspiration. Improved on antibiotics. [name] with exertion. No wheezing or cough.  08/16/22 Family is present and provides additional history. In the last several months she has had multiple exacerbations including in September, October and November (Augment/Doxy for pneumonia). Duonebs were added to her regimen. At the end of October was started on chronic  prednisone 5 mg daily by Dr. Tonia Brooms. She continues to have shortness of breath with minimal exertion. Denies cough or wheezing. She is compliant with her meds but does not feel it is effective and not able to inhale properly. Since her stroke she is weak and feels her breathing has declined with her strength.  11/12/22 Present with son who provides additional history. Since our last visit she has called the office to report the combo nebs are not effective and was seen in the ED on 10/29/22. Discharged with doxycycline, no new O2 requirements. Called office after hours on 11/04/22 and prescribed prednisone 40 mg x 5 days. Compliant with her nebulizer regimen but does feel that the steroid nebulizer causes a filmy taste. Son reports she is overall improved on this regimen with only shortness of breath on exertion. No cough or wheezing. Compliant with nocturnal oxygen.   02/11/23 She was doing well with her current nebulizer regimen. No further thrush after reducing Pulmicort to daily. However unable to complete nystatin due to individual packages - needing to cut with knife. In the last two days she is having shortness of breath. Denies wheezing or cough. Denies fevers, chills. Compliant with oxygen. Discussed goals of care and expresses she would want to be DNR. Family present in clinic today.  05/17/23 Since our last visit she feels overall well. Has shortness of breath with exertion. Denies cough, wheezing. On nebulizer regimen as documented below. Can use walker and does standing exercises for 20 min. Able to perform housework including washing dishes, laundry and some cooking. Showers without assistance. Daughter is  working on ramp access to her home.   08/22/23 She reports some good days and bad days. [name]. Denies wheezing or cough. Good days are more often. Treated for COPD exacerbation around Thanksgiving and improved after prednisone. Son reports mobility is less and she is ad about lack  of independence. Her residual symptoms are worsen.   12/10/23 Son present. Since our last visit she was treated for COPD exacerbation in March 2025. Continues to have shortness of breath. Denies wheezing or cough. Compliant with nebulizers and nightly oxygen. Seen by palliative care and appreciated their care.   Prior inhalers: Trelegy - thrush  Social history: Quit in 2004. 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   [name]    Cataract    Chronic kidney disease    kidney stone   Colitis    Colon cancer (HCC) 06/2017   cecum   Diabetes mellitus without complication (HCC)    no meds taken   Emphysema of lung (HCC)    GERD (gastroesophageal reflux disease)    Hyperlipidemia    Hypertension    Peripheral arterial disease (HCC)    [name]    Stroke [facility])    November 2016- Thanksgiving night     Allergies  Allergen Reactions   Lipitor [Atorvastatin] Itching     Outpatient Medications Prior to Visit  Medication Sig Dispense Refill   acetaminophen (TYLENOL) 500 MG tablet Take 500 mg by mouth daily.      albuterol (VENTOLIN HFA) 108 (90 Base) MCG/ACT inhaler Inhale 1-2 puffs into the lungs every 4 (four) hours as needed for wheezing or shortness of breath. 18 g 3   arformoterol (BROVANA) 15 MCG/2ML NEBU Take 2 mLs (15 mcg total) by nebulization 2 (two) times daily. 120 mL 11   aspirin EC 81 MG tablet Take 1 tablet (81 mg total) by mouth daily. 90 tablet 3   budesonide (PULMICORT) 0.5 MG/2ML nebulizer solution Take 2 mLs (0.5 mg total) by nebulization in the morning and at bedtime. 120 mL 11   gabapentin (NEURONTIN) 300 MG capsule Take 2 capsules by mouth twice daily 360 capsule 0   ipratropium-albuterol (DUONEB) 0.5-2.5 (3) MG/3ML SOLN Take 3 mLs by nebulization every 6 (six) hours as needed. 360 mL 11   nystatin (MYCOSTATIN) 100000 UNIT/ML suspension Take 5 mLs (500,000 Units total) by mouth 4 (four) times  daily. 140 mL 0   omeprazole (PRILOSEC) 40 MG capsule Take 1 capsule by mouth once daily 90 capsule 0   Polyvinyl Alcohol-Povidone (REFRESH OP) Place 1 drop into both eyes daily as needed (For dry eyes).     pravastatin (PRAVACHOL) 20 MG tablet Take 1 tablet by mouth once daily 90 tablet 0   traMADol (ULTRAM) 50 MG tablet TAKE 1 TABLET BY MOUTH ONCE DAILY AS NEEDED 30 tablet 5   VITAMIN D PO Take 1 tablet by mouth daily.     No facility-administered medications prior to visit.    Review of Systems  Constitutional:  Negative for chills, diaphoresis, fever, malaise/fatigue and weight loss.  HENT:  Negative for congestion.   Respiratory:  Positive for shortness of breath. Negative for cough, hemoptysis, sputum production and wheezing.   Cardiovascular:  Negative for chest pain, palpitations and leg swelling.   Objective:   Vitals:   12/10/23 1502  BP: 116/74  Pulse: 60  SpO2: 96%  Weight: 105 lb (47.6 kg)  Height: 4\' 11"  (  1.499 m)   96%  BMI Readings from Last 3 Encounters:  12/10/23 21.21 kg/m  09/12/23 20.90 kg/m  08/22/23 20.90 kg/m   Wt Readings from Last 3 Encounters:  12/10/23 105 lb (47.6 kg)  08/22/23 103 lb 8 oz (46.9 kg)  05/17/23 103 lb 1.6 oz (46.8 kg)    Physical Exam: General: Frail and chronically ill-appearing, no acute distress HENT: Crisp, AT, OP clear, MMM Eyes: EOMI, no scleral icterus Respiratory: Diminished but clear to auscultation bilaterally.  No crackles, wheezing or rales Cardiovascular: RRR, -M/R/G, no JVD Extremities:-Edema,-tenderness Neuro: AAO x4, CNII-XII grossly intact  Chest Imaging- films reviewed: CTA Chest 10/27/2019-emphysema, bilateral dependent pleural effusions.  No PE, no significant mediastinal or hilar adenopathy.  Hernia with stomach and abdominal fat contents protruding into the mediastinum. CXR 05/22/21 - COPD. No infiltrate effusion or edema CXR 02/26/22 - Resolution of lower lobe infiltrates CXR 10/29/22 - No acute  cardiopulmonary disease  Pulmonary Functions Testing Results:    Latest Ref Rng & Units 11/01/2020    3:05 PM 02/13/2016    1:04 PM  PFT Results  FVC-Pre L 1.82  1.82   FVC-Predicted Pre % 82  76   FVC-Post L 1.80  2.02   FVC-Predicted Post % 81  84   Pre FEV1/FVC % % 48  45   Post FEV1/FCV % % 46  47   FEV1-Pre L 0.87  0.82   FEV1-Predicted Pre % 52  46   FEV1-Post L 0.83  0.94   DLCO uncorrected ml/min/mmHg 9.89  9.75   DLCO UNC% % 60  51   DLCO corrected ml/min/mmHg 9.89  9.26   DLCO COR %Predicted % 60  49   DLVA Predicted % 80  64   TLC L 5.04    TLC % Predicted % 113    RV % Predicted % 159     2017- severe obstruction with out significant bronchodilator reversibility.  Moderate diffusion impairment.  Flow volume loop supports obstruction.  2019 spirometry: FVC 1.8 (75%) FEV1 0.7 (39%) Ratio 38%  PFT 11/01/20 FVC 1.8 (81%) FEV1 0.83 (50%) Ratio 48  TLC 113% RV 159% DLCO 60% Interpretation: Moderately severe obstructive defect with air trapping and reduced DLCO consistent with emphysema.  ONO study 03/02/22 - SpO2 <88% 15 min 44 seconds. 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 Multiple exacerbations including in September, October and November 2023 and Feb and March 2024 and June 2024 and November 2024. Treated for exacerbation in March 2025. Discussed clinical course and management of COPD including bronchodilator regimen, preventive care and action plan for exacerbation.   COPD with emphysema -symptomatic but controlled --REFILL nebulizer regimen as follows:  --Pulmicort/Budesonide ONE neb in the morning  --Brovana/aformoterol ONE neb in the morning and evening  --Duoneb/ipratropium-albuterol ONE neb every morning and evening  --CONTINUE Albuterol as needed for shortness of breath or wheezing --Not a candidate for pulmonary rehab right now. No longer working with PT --She would consider  chronic prednisone in the future  > Prednisone pack ordered for when worsening shortness of breath, cough and wheezing  Nocturnal hypoxemia --CONTINUE 1L O2 nightly for goal >88%. Last recertified 06/2024  GOC --Palliative care following. --Discussed mechanical ventilation or CPR. Patient wishes to be DNR/DNI --Code status updated to DNR/DNI  Health Maintenance Immunization History  Administered Date(s) Administered   Fluad Quad(high Dose 65+) 06/18/2019, 07/13/2020, 07/19/2021, 07/02/2022  Influenza, High Dose Seasonal PF 07/27/2013, 05/14/2016, 05/20/2017, 06/05/2018   Influenza,inj,Quad PF,6+ Mos 08/03/2014, 06/21/2015   Influenza,inj,quad, With Preservative 06/04/2019   Influenza-Unspecified 07/04/2012, 06/18/2022, 07/22/2023   PFIZER(Purple Top)SARS-COV-2 Vaccination 11/17/2019, 12/08/2019, 08/17/2020   PNEUMOCOCCAL CONJUGATE-20 09/12/2023   Pneumococcal Conjugate-13 11/11/2015   Pneumococcal-Unspecified 09/03/2010   Td 09/03/2012   CT Lung Screening - not a candidate. Quit smoking > 15 years ago  Orders Placed This Encounter  Procedures   Do not attempt resuscitation (DNR)    If patient has no pulse and is not breathing:   Do Not Attempt Resuscitation    If patient has a pulse and/or is breathing: Medical Treatment Goals:   LIMITED ADDITIONAL INTERVENTIONS: Use medication/IV fluids and cardiac monitoring as indicated; Do not use intubation or mechanical ventilation (DNI), also provide comfort medications.  Transfer to Progressive/Stepdown as indicated, avoid Intensive Care.    Consent::   Discussion documented in EHR or advanced directives reviewed   Meds ordered this encounter  Medications   predniSONE (DELTASONE) 10 MG tablet    Sig: Take 4 tablets (40 mg total) by mouth daily with breakfast for 2 days, THEN 3 tablets (30 mg total) daily with breakfast for 2 days, THEN 2 tablets (20 mg total) daily with breakfast for 2 days, THEN 1 tablet (10 mg total) daily with  breakfast for 2 days.    Dispense:  20 tablet    Refill:  0   I have spent a total time of 35-minutes on the day of the appointment including chart review, data review, collecting history, coordinating care and discussing medical diagnosis and plan with the patient/family. Past medical history, allergies, medications were reviewed. Pertinent imaging, labs and tests included in this note have been reviewed and interpreted independently by me.  Tasfia Vasseur Mechele Collin, MD [facility] 12/10/2023 3:28 PM

## 2023-12-19 ENCOUNTER — Telehealth: Payer: Self-pay | Admitting: Internal Medicine

## 2023-12-19 NOTE — Telephone Encounter (Signed)
 Copied from CRM [id number]. Topic: General - Other >> Dec 19, 2023 11:09 AM Allyne Areola wrote: Reason for CRM: Patient is calling because she received notice that it was time to renew her disability parking placeholder and she would like to know does she need to keep renew it every year or is there a way for Dr.Crawford to submit for a permanent one.

## 2023-12-23 NOTE — Telephone Encounter (Signed)
**Note De-identified  [place] Obfuscation** Please advise 

## 2023-12-27 DIAGNOSIS — J449 Chronic obstructive pulmonary disease, unspecified: Secondary | ICD-10-CM | POA: Diagnosis not present

## 2023-12-27 DIAGNOSIS — J189 Pneumonia, unspecified organism: Secondary | ICD-10-CM | POA: Diagnosis not present

## 2023-12-27 NOTE — Telephone Encounter (Signed)
Placed inside office box 

## 2023-12-27 NOTE — Telephone Encounter (Signed)
 Ok to put one in my basket

## 2024-01-04 DIAGNOSIS — J449 Chronic obstructive pulmonary disease, unspecified: Secondary | ICD-10-CM | POA: Diagnosis not present

## 2024-01-04 DIAGNOSIS — J189 Pneumonia, unspecified organism: Secondary | ICD-10-CM | POA: Diagnosis not present

## 2024-01-09 ENCOUNTER — Telehealth: Payer: Self-pay

## 2024-01-09 NOTE — Telephone Encounter (Signed)
 I will place them in the mailbox to be sent off to the patient.

## 2024-01-16 ENCOUNTER — Other Ambulatory Visit: Payer: Self-pay | Admitting: Internal Medicine

## 2024-01-21 ENCOUNTER — Ambulatory Visit (HOSPITAL_BASED_OUTPATIENT_CLINIC_OR_DEPARTMENT_OTHER): Payer: Self-pay | Admitting: Pulmonary Disease

## 2024-01-21 NOTE — Telephone Encounter (Signed)
  Chief Complaint: fatigue Symptoms: fatigue Frequency: a few days Pertinent Negatives: Patient denies fever, cough, SOB, CP, URI sx Disposition: [] ED /[] Urgent Care (no appt availability in office) / [] Appointment(In office/virtual)/ []  [facility]/ [] Home Care/ [] Refused Recommended Disposition /[] [place] [facility]/ [x]  Follow-up with Pulm Additional Notes: Pt c/o fatigue x a few days and requesting predniSONE  (DELTASONE ) 10 MG tablet refill "to make her feel better". Denies any fever, CP, or SOB. Pt is using 2L O2 at night and PRN. Pt has not checked her SpO2 level d/t it being out of battery. Triager will forward encounter for Dr. Washington Hacker 's office to review and advise. Patient verbalized understanding and is expecting call back from office if able to process refill. Patient verbalized understanding and to call back with worsening symptoms.     Reason for Disposition  [1] Prescription refill request for NON-ESSENTIAL medicine (i.e., no harm to patient if med not taken) AND [2] triager unable to refill per department policy  Answer Assessment - Initial Assessment Questions E2C2 Pulmonary Triage - Initial Assessment Questions "Chief Complaint (e.g., cough, sob, wheezing, fever, chills, sweat or additional symptoms) *Go to specific symptom protocol after initial questions. Requesting prednisone  Rx "I just need to get some more energy" "The doctor told me if I think I needed this, to call the pharmacy"  Hoarse voice  "How long have symptoms been present?" unknown  Have you tested for COVID or Flu? Note: If not, ask patient if a home test can be taken. If so, instruct patient to call back for positive results. No  MEDICINES:   "Have you used your inhalers/maintenance medication?" Yes If yes, "What medications?" DuoNeb    OXYGEN : "Do you wear supplemental oxygen ?" Yes If yes, "How many liters are you supposed to use?" A little over 2L at night  "Do you monitor  your oxygen  levels?" Yes If yes, "What is your reading (oxygen  level) today?" Did not check - needs to change batteries     1. DRUG NAME: "What medicine do you need to have refilled?"     predniSONE  (DELTASONE ) 10 MG tablet 2. REFILLS REMAINING: "How many refills are remaining?" (Note: The label on the medicine or pill bottle will show how many refills are remaining. If there are no refills remaining, then a renewal may be needed.)     0 3. EXPIRATION DATE: "What is the expiration date?" (Note: The label states when the prescription will expire, and thus can no longer be refilled.)     N/a 4. PRESCRIBING HCP: "Who prescribed it?" Reason: If prescribed by specialist, call should be referred to that group.     Dr. Washington Hacker 5. SYMPTOMS: "Do you have any symptoms?"     weakness  Protocols used: Medication Refill and Renewal Call-A-AH

## 2024-01-22 MED ORDER — PREDNISONE 10 MG PO TABS: ORAL_TABLET | ORAL | 0 refills | Status: DC

## 2024-01-22 NOTE — Telephone Encounter (Signed)
 Pt notified and rx sent to pharmacy

## 2024-01-22 NOTE — Addendum Note (Signed)
 Addended by: Kary Pages on: 01/22/2024 01:47 PM   Modules accepted: Orders

## 2024-01-22 NOTE — Telephone Encounter (Signed)
 ATC X1. LMTCB

## 2024-01-22 NOTE — Telephone Encounter (Signed)
 We can send in prednisone  10mg  x 1 week; then 5mg  x 1 week  Reassess after

## 2024-01-22 NOTE — Telephone Encounter (Signed)
 Please advise on Prednisone  Rx pt is requesting

## 2024-01-22 NOTE — Telephone Encounter (Signed)
 Reviewed Dr. Fayetta Hoose last note, patient has COPD and emphysema. Following with palliative care. Mentioned chronic prednisone  in the future.   Can you call patient and ask her more about her fatigue symptoms. Typically we prescribed prednisone  for shortness of breath, wheezing or cough- does she have any of these symptoms

## 2024-01-24 ENCOUNTER — Other Ambulatory Visit: Payer: Self-pay | Admitting: Internal Medicine

## 2024-01-26 DIAGNOSIS — J449 Chronic obstructive pulmonary disease, unspecified: Secondary | ICD-10-CM | POA: Diagnosis not present

## 2024-01-26 DIAGNOSIS — J189 Pneumonia, unspecified organism: Secondary | ICD-10-CM | POA: Diagnosis not present

## 2024-01-31 ENCOUNTER — Other Ambulatory Visit: Payer: Self-pay | Admitting: Internal Medicine

## 2024-02-03 ENCOUNTER — Other Ambulatory Visit: Payer: Self-pay | Admitting: Internal Medicine

## 2024-02-04 DIAGNOSIS — J449 Chronic obstructive pulmonary disease, unspecified: Secondary | ICD-10-CM | POA: Diagnosis not present

## 2024-02-04 DIAGNOSIS — J189 Pneumonia, unspecified organism: Secondary | ICD-10-CM | POA: Diagnosis not present

## 2024-02-10 ENCOUNTER — Ambulatory Visit (INDEPENDENT_AMBULATORY_CARE_PROVIDER_SITE_OTHER): Admitting: Internal Medicine

## 2024-02-10 ENCOUNTER — Encounter: Payer: Self-pay | Admitting: Internal Medicine

## 2024-02-10 VITALS — BP 150/48 | HR 91 | Temp 98.6°F | Ht 59.0 in | Wt 101.0 lb

## 2024-02-10 DIAGNOSIS — J449 Chronic obstructive pulmonary disease, unspecified: Secondary | ICD-10-CM | POA: Diagnosis not present

## 2024-02-10 DIAGNOSIS — J9611 Chronic respiratory failure with hypoxia: Secondary | ICD-10-CM | POA: Diagnosis not present

## 2024-02-10 DIAGNOSIS — I739 Peripheral vascular disease, unspecified: Secondary | ICD-10-CM

## 2024-02-10 DIAGNOSIS — E785 Hyperlipidemia, unspecified: Secondary | ICD-10-CM

## 2024-02-10 DIAGNOSIS — N3944 Nocturnal enuresis: Secondary | ICD-10-CM | POA: Diagnosis not present

## 2024-02-10 DIAGNOSIS — E1169 Type 2 diabetes mellitus with other specified complication: Secondary | ICD-10-CM

## 2024-02-10 DIAGNOSIS — E118 Type 2 diabetes mellitus with unspecified complications: Secondary | ICD-10-CM | POA: Diagnosis not present

## 2024-02-10 MED ORDER — MIRABEGRON ER 50 MG PO TB24: 50.0000 mg | ORAL_TABLET | Freq: Every day | ORAL | 1 refills | Status: AC

## 2024-02-10 NOTE — Progress Notes (Signed)
   Subjective:   Patient ID: Sherry Lambert, female    DOB: [birth year], 83 y.o.   MRN: [id number]  HPI The patient is an 83 YO female coming in for medical management (see A/P for details).   Review of Systems  Constitutional: Negative.   HENT: Negative.    Eyes: Negative.   Respiratory:  Negative for cough, chest tightness and shortness of breath.   Cardiovascular:  Positive for chest pain. Negative for palpitations and leg swelling.  Gastrointestinal:  Negative for abdominal distention, abdominal pain, constipation, diarrhea, nausea and vomiting.  Musculoskeletal:  Positive for arthralgias, gait problem and myalgias.  Skin: Negative.   Neurological:  Positive for weakness.  Psychiatric/Behavioral: Negative.      Objective:  Physical Exam Constitutional:      Appearance: She is well-developed.  HENT:     Head: Normocephalic and atraumatic.   Cardiovascular:     Rate and Rhythm: Normal rate and regular rhythm.  Pulmonary:     Effort: Pulmonary effort is normal. No respiratory distress.     Breath sounds: Normal breath sounds. No wheezing or rales.  Abdominal:     General: Bowel sounds are normal. There is no distension.     Palpations: Abdomen is soft.     Tenderness: There is no abdominal tenderness. There is no rebound.   Musculoskeletal:        General: Tenderness present.     Cervical back: Normal range of motion.   Skin:    General: Skin is warm and dry.   Neurological:     Mental Status: She is alert and oriented to person, place, and time. Mental status is at baseline.     Sensory: Sensory deficit present.     Motor: Weakness present.     Coordination: Coordination abnormal.     Vitals:   02/10/24 1609  BP: (!) 150/48  Pulse: 91  Temp: 98.6 F (37 C)  TempSrc: Oral  SpO2: 92%  Weight: 101 lb (45.8 kg)  Height: 4' 11 (1.499 m)    Assessment & Plan:

## 2024-02-10 NOTE — Patient Instructions (Signed)
 We have sent in myrbetriq to take daily for the bladder. It can take a month to get to full benefit.

## 2024-02-14 NOTE — Assessment & Plan Note (Signed)
 Taking statin, BP at goal and on aspirin  which we will continue.

## 2024-02-14 NOTE — Assessment & Plan Note (Signed)
 Taking statin and check lipid panel with next lab draw.

## 2024-02-14 NOTE — Assessment & Plan Note (Signed)
 Rx myrbetriq  to help as her mobility is limited and getting to restroom on time is challenging. She is also advised to start a schedule to help gradually give her more time between urinating during the day.

## 2024-02-14 NOTE — Assessment & Plan Note (Signed)
 Uses 1.5 L continuous but not good compliance. Not very active overall. Continue.

## 2024-02-14 NOTE — Assessment & Plan Note (Signed)
 Seeing pulmonary and using nebulized medications and rescue inhaler and nebulized as well.

## 2024-02-14 NOTE — Assessment & Plan Note (Signed)
 Will defer labs to next visit per patient preference and controlled values. Is diet controlled currently and on statin.

## 2024-02-21 DIAGNOSIS — J449 Chronic obstructive pulmonary disease, unspecified: Secondary | ICD-10-CM | POA: Diagnosis not present

## 2024-02-21 DIAGNOSIS — J189 Pneumonia, unspecified organism: Secondary | ICD-10-CM | POA: Diagnosis not present

## 2024-02-26 DIAGNOSIS — J189 Pneumonia, unspecified organism: Secondary | ICD-10-CM | POA: Diagnosis not present

## 2024-02-26 DIAGNOSIS — J449 Chronic obstructive pulmonary disease, unspecified: Secondary | ICD-10-CM | POA: Diagnosis not present

## 2024-03-05 DIAGNOSIS — J449 Chronic obstructive pulmonary disease, unspecified: Secondary | ICD-10-CM | POA: Diagnosis not present

## 2024-03-05 DIAGNOSIS — J189 Pneumonia, unspecified organism: Secondary | ICD-10-CM | POA: Diagnosis not present

## 2024-03-09 ENCOUNTER — Ambulatory Visit

## 2024-03-09 ENCOUNTER — Encounter (HOSPITAL_COMMUNITY)

## 2024-03-21 ENCOUNTER — Other Ambulatory Visit: Payer: Self-pay | Admitting: Internal Medicine

## 2024-03-23 ENCOUNTER — Other Ambulatory Visit: Payer: Self-pay | Admitting: Internal Medicine

## 2024-03-26 ENCOUNTER — Other Ambulatory Visit: Payer: Self-pay | Admitting: Internal Medicine

## 2024-03-27 ENCOUNTER — Other Ambulatory Visit: Payer: Self-pay | Admitting: Internal Medicine

## 2024-03-27 DIAGNOSIS — J189 Pneumonia, unspecified organism: Secondary | ICD-10-CM | POA: Diagnosis not present

## 2024-03-27 DIAGNOSIS — J449 Chronic obstructive pulmonary disease, unspecified: Secondary | ICD-10-CM | POA: Diagnosis not present

## 2024-03-31 ENCOUNTER — Telehealth (HOSPITAL_BASED_OUTPATIENT_CLINIC_OR_DEPARTMENT_OTHER): Payer: Self-pay | Admitting: *Deleted

## 2024-03-31 NOTE — Telephone Encounter (Signed)
 Denied. Needs OV to discuss long term prednisone  use, see if Sherry Lambert has any openings

## 2024-03-31 NOTE — Telephone Encounter (Signed)
 Spoke with patient. Advised that she would need to be seen in order to get another refill of prednisone . Offered 8/4 with [facility]. Patient declined states Dr Kassie told me all I need to do was call for this, then proceeded to say she was fine and did not need it. Nothing further needed.

## 2024-03-31 NOTE — Telephone Encounter (Signed)
 Can you please schedule PT with [facility] per Almarie ORN, NP .Thanks

## 2024-04-03 ENCOUNTER — Ambulatory Visit (HOSPITAL_BASED_OUTPATIENT_CLINIC_OR_DEPARTMENT_OTHER): Payer: Self-pay | Admitting: Pulmonary Disease

## 2024-04-03 MED ORDER — PREDNISONE 10 MG PO TABS: ORAL_TABLET | ORAL | 0 refills | Status: DC

## 2024-04-03 NOTE — Telephone Encounter (Signed)
 RX sent to pharmacy as pt daughter in law states she did not go to ER EMS thought she needs an antibiotic and prednisone . I did advise that we would not call in antibiotic as pt is only experiencing SOB no cough or other symptoms. She will come in for appt on Monday with [facility] to follow-up and see if chest xray is necessary as she believes she is getting PNA but has no other symptoms but SOB.

## 2024-04-03 NOTE — Telephone Encounter (Signed)
 Patient agreed to going to the Emergency Room at this time During 911 call patient hung up and said she would call her son 911 advised that they would call patient back and send emergency services that way  FYI Only or Action Required?: Action required by provider: update on patient condition.  Patient is followed in Pulmonology for COPD, last seen on 12/10/2023 by Kassie Acquanetta Bradley, MD.  Called Nurse Triage reporting [name].  Symptoms began this morning.  Interventions attempted: Prescription medications: pulmonary medications, Nebulizer treatments, and Home oxygen  use.  Symptoms are: gradually worsening.  Triage Disposition: Call EMS 911 Now  Patient/caregiver understands and will follow disposition?: Unsure                 Copied from CRM [id number]. Topic: Clinical - Red Word Triage >> Apr 03, 2024 11:30 AM Celestine FALCON wrote: Red Word that prompted transfer to Nurse Triage: Pt stated she feels like she may be getting pneumonia. Pt stated her sx started this morning being short of breath with difficulty breathing. Pt is requesting to be seen today, no appt was made. Pt is a pt of Dr. Kassie at [facility]. Reason for Disposition  Sounds like a life-threatening emergency to the triager  Answer Assessment - Initial Assessment Questions Patient states that she has been having shortness of breath that started this morning---she states it is worse than normal and patient sounded very winded---Patient only able to get a few words out at a time and she states she feels like she can't get a good breath and states it's not like it should be She used her prescribed meds for pulmonary and she states that she is on a little over 2 liters of Oxygen . She does not use a home pulse oximeter   Patient is advised that the Emergency Room is recommended at this time to help her immediately Patient was agreeable to this and patient asked if I would call them--this RN advised the  patient that I would get an ambulance on the way to her. Patient states ok  This RN called 911 for the patient When 911 went to speak to the patient with additional questions---Patient states that she can't go right now because she isn't ready and states she is going to call her son. Patient hangs up. 911 operator advised that they would take care of the situation from here---911 states she will call the patient back and start emergency services that way    E2C2 Pulmonary Triage - Initial Assessment Questions Chief Complaint (e.g., cough, sob, wheezing, fever, chills, sweat or additional symptoms) *Go to specific symptom protocol after initial questions. [name]  How long have symptoms been present? Just today   MEDICINES:   Have you used any OTC meds to help with symptoms?  If yes, ask What medications? ----  Have you used your inhalers/maintenance medication? Yes If yes, What medications? Patient states she used her prescribed meds    OXYGEN : Do you wear supplemental oxygen ? Yes If yes, How many liters are you supposed to use? 2  Do you monitor your oxygen  levels? No If yes, What is your reading (oxygen  level) today? unknown  What is your usual oxygen  saturation reading?  (Note: Pulmonary O2 sats should be 90% or greater) --------     3. PATTERN Does the difficult breathing come and go, or has it been constant since it started?      Worse than normal 4. SEVERITY: How bad is your  breathing? (e.g., mild, moderate, severe)      Patient states worse than normal 5. RECURRENT SYMPTOM: Have you had difficulty breathing before? If Yes, ask: When was the last time? and What happened that time?      ---- 6. CARDIAC HISTORY: Do you have any history of heart disease? (e.g., heart attack, angina, bypass surgery, angioplasty)      ----- 7. LUNG HISTORY: Do you have any history of lung disease?  (e.g., pulmonary embolus, asthma,  emphysema)     COPD 8. CAUSE: What do you think is causing the breathing problem?      ---- 9. OTHER SYMPTOMS: Do you have any other symptoms? (e.g., chest pain, cough, dizziness, fever, runny nose)     ----  Protocols used: Breathing Difficulty-A-AH

## 2024-04-03 NOTE — Telephone Encounter (Signed)
 Duplicate please see previous encounter

## 2024-04-03 NOTE — Addendum Note (Signed)
 Addended by: TRUDY WARREN CROME on: 04/03/2024 01:30 PM   Modules accepted: Orders

## 2024-04-03 NOTE — Telephone Encounter (Signed)
 FYI

## 2024-04-03 NOTE — Telephone Encounter (Signed)
 If she is refusing ED I can send in prednisone  to get her through the weekend and can see joan or double book with me

## 2024-04-03 NOTE — Telephone Encounter (Signed)
 Agree with ED evaluated, we have no afternoon appointments. She was offered an apt on Monday but declined.

## 2024-04-03 NOTE — Telephone Encounter (Signed)
 Copied from CRM [id number]. Topic: Clinical - Medication Question >> Apr 03, 2024 12:03 PM Shereese L wrote: Reason for CRM: patient daughter in law called in upset and stated that patient is on the way to the ER by ambulance. She stated that the patient called in and requested prednisone  to avoid flare up.  I advised her as per notes on 07/29 that she would need to be seen in order to get another refill of prednisone . Offered 8/4 with [facility]. Patient declined... She stated that for the past 78yrs she's been calling in requesting the medication and wants the nurse or doctor to call her back @ [phone]

## 2024-04-03 NOTE — Telephone Encounter (Signed)
 Thanks

## 2024-04-05 DIAGNOSIS — J449 Chronic obstructive pulmonary disease, unspecified: Secondary | ICD-10-CM | POA: Diagnosis not present

## 2024-04-05 DIAGNOSIS — J189 Pneumonia, unspecified organism: Secondary | ICD-10-CM | POA: Diagnosis not present

## 2024-04-06 ENCOUNTER — Ambulatory Visit (HOSPITAL_BASED_OUTPATIENT_CLINIC_OR_DEPARTMENT_OTHER): Payer: Self-pay | Admitting: Pulmonary Disease

## 2024-04-06 ENCOUNTER — Ambulatory Visit (HOSPITAL_BASED_OUTPATIENT_CLINIC_OR_DEPARTMENT_OTHER)

## 2024-04-06 NOTE — Telephone Encounter (Signed)
 I spoke with Candis Dandy, PA and she advised pt to try 600mg  BID of Mucinex  with the Prednisone  she was given on Friday and if no improvement in a few days pt will need an OV. Pt daughter in law notified she agrees with this.

## 2024-04-06 NOTE — Telephone Encounter (Signed)
 FYI Only or Action Required?: Action required by provider: update on patient condition.  Patient is followed in Pulmonology for COPD, last seen on 12/10/2023 by Kassie Acquanetta Bradley, MD.  Called Nurse Triage reporting Chest Congestion.  Symptoms began today.  Interventions attempted: Nothing.  Symptoms are: unchanged.  Triage Disposition: See PCP When Office is Open (Within 3 Days)  Patient/caregiver understands and will follow disposition?: No, wishes to speak with PCP   (Patient seen in the office earlier today)      Copied from CRM #[id number]. Topic: Clinical - Medical Advice >> Apr 06, 2024  3:25 PM Henretta I wrote: Reason for CRM: Patients daughter in law Kym called in because patient has a little rattle like she has phlegm in chest and wants to know if something can be prescribed to clear this up. Her phone number for contact is [phone].      Reason for Disposition  [1] MODERATE longstanding difficulty breathing (e.g., speaks in phrases, SOB even at rest, pulse 100-120) AND [2] SAME as normal  Answer Assessment - Initial Assessment Questions Patient had an appointment today and after getting home laying down her daughter in law noticed a rattle in her chest. She states that the patient has not had any new cough or difficulty breathing and wanted to know if the patient should be prescribed an antibiotic or any other advice the office would have for treating the rattle. Please advise.        1. RESPIRATORY STATUS: Describe your breathing? (e.g., wheezing, shortness of breath, unable to speak, severe coughing)      Rattle in chest  2. ONSET: When did this breathing problem begin?      Today  3. PATTERN Does the difficult breathing come and go, or has it been constant since it started?      No difficulty breathing  4. SEVERITY: How bad is your breathing? (e.g., mild, moderate, severe)      Denies any increased difficulty breathing  6. CARDIAC HISTORY: Do you  have any history of heart disease? (e.g., heart attack, angina, bypass surgery, angioplasty)      No 7. LUNG HISTORY: Do you have any history of lung disease?  (e.g., pulmonary embolus, asthma, emphysema)     Yes 8. CAUSE: What do you think is causing the breathing problem?      Unsure  9. OTHER SYMPTOMS: Do you have any other symptoms? (e.g., chest pain, cough, dizziness, fever, runny nose)     No  Protocols used: Breathing Difficulty-A-AH

## 2024-04-13 ENCOUNTER — Other Ambulatory Visit: Payer: Self-pay | Admitting: Pulmonary Disease

## 2024-04-19 ENCOUNTER — Other Ambulatory Visit: Payer: Self-pay | Admitting: Pulmonary Disease

## 2024-04-20 ENCOUNTER — Telehealth (HOSPITAL_BASED_OUTPATIENT_CLINIC_OR_DEPARTMENT_OTHER): Payer: Self-pay

## 2024-04-20 MED ORDER — PREDNISONE 10 MG PO TABS: ORAL_TABLET | ORAL | 0 refills | Status: DC

## 2024-04-20 NOTE — Telephone Encounter (Signed)
 Copied from CRM #[id number]. Topic: Clinical - Medication Question >> Apr 20, 2024 12:44 PM Devaughn RAMAN wrote: Reason for CRM: Patient daughter in law Kym called regarding medication refill. Kym stated [facility] called in regarding the medication refill. Kym stated she can be contacted at [phone]. Kym is on DPR. Kym stated she would like to know if the predniSONE  (DELTASONE ) 10 MG tablet is going to be refilled or if she needs to bring the patient in for an appointment. Kym stated she needs an appt for the patient by Wednesday, August 20th as she is traveling after that.

## 2024-04-20 NOTE — Telephone Encounter (Signed)
 Called patient/patient's daughter with worsening shortness of breath and cough. Previously improved on prednisone  taper and had discussion in the past with me considering chronic prednisone  in the near future for persistent symptoms.  Will treat for exacerbation again with follow-up scheduled 05/19/24 @ 3PM

## 2024-04-27 DIAGNOSIS — J449 Chronic obstructive pulmonary disease, unspecified: Secondary | ICD-10-CM | POA: Diagnosis not present

## 2024-04-27 DIAGNOSIS — J189 Pneumonia, unspecified organism: Secondary | ICD-10-CM | POA: Diagnosis not present

## 2024-04-28 ENCOUNTER — Other Ambulatory Visit: Payer: Self-pay | Admitting: Internal Medicine

## 2024-05-06 ENCOUNTER — Ambulatory Visit (HOSPITAL_BASED_OUTPATIENT_CLINIC_OR_DEPARTMENT_OTHER): Payer: Self-pay | Admitting: Pulmonary Disease

## 2024-05-06 ENCOUNTER — Telehealth: Payer: Self-pay

## 2024-05-06 ENCOUNTER — Ambulatory Visit: Admitting: Pulmonary Disease

## 2024-05-06 DIAGNOSIS — J189 Pneumonia, unspecified organism: Secondary | ICD-10-CM | POA: Diagnosis not present

## 2024-05-06 DIAGNOSIS — J449 Chronic obstructive pulmonary disease, unspecified: Secondary | ICD-10-CM | POA: Diagnosis not present

## 2024-05-06 MED ORDER — AZITHROMYCIN 250 MG PO TABS
ORAL_TABLET | ORAL | 0 refills | Status: DC
Start: 2024-05-06 — End: 2024-05-19

## 2024-05-06 MED ORDER — PREDNISONE 10 MG PO TABS: ORAL_TABLET | ORAL | 0 refills | Status: AC

## 2024-05-06 NOTE — Telephone Encounter (Signed)
     Copied from CRM [id number]. Topic: Clinical - Red Word Triage >> May 06, 2024  9:08 AM Nathanel DEL wrote: Red Word that prompted transfer to Nurse Triage: kim is daughter in law calling b/c pt having a little more trouble breathing. Finished prednisone  pak last week Answer Assessment - Initial Assessment Questions Daughter in law Kym on DPR on phone for pt, not with pt at this time  E2C2 Pulmonary Triage - Initial Assessment Questions Chief Complaint (e.g., cough, sob, wheezing, fever, chills, sweat or additional symptoms) *Go to specific symptom protocol after initial questions. Last time went to check on her, told me having bit harder time breathing Talked to Kassie a week ago, called in prednisone  last week, told me to call if needed more prednisone , talked about higher dose, not sure if what we need, appt on 9/16 Pt said felt like needed antibx Not coughing up anything, no fever, but just more SOB Mainly with exertion SOB worse, gets out of breath  How long have symptoms been present? Finished prednisone  about a week ago, started in the last day, had been feeling better  Have you used your inhalers/maintenance medication? Yes If yes, What medications? Not really sure  OXYGEN : Do you wear supplemental oxygen ? Yes If yes, How many liters are you supposed to use? As needed, been doing this this is no change, does it at night, doing during the day  Do you monitor your oxygen  levels? No  6. CARDIAC HISTORY: Do you have any history of heart disease? (e.g., heart attack, angina, bypass surgery, angioplasty)      *No Answer*  7. LUNG HISTORY: Do you have any history of lung disease?  (e.g., pulmonary embolus, asthma, emphysema)     *No Answer*  Protocols used: Breathing Difficulty-A-AH

## 2024-05-06 NOTE — Telephone Encounter (Signed)
 Pt's daughter in law called on behalf of the pt. Pt missed today's appt by going to [facility] instead of [facility]. Kym states the pt has Worsening breathing- s.o.b with exertion and has some wheezing. Pt is still taking her albuterol  and nebulizer which does help however, Kym states Dr Kassie will prescribe her Prednisone  that does help, put Kym thinks she needs something stronger. Kym states she wants the pt to be put on prednisone  and an antibiotic like Dr Kassie prescribes at times.   [phone]- Kym

## 2024-05-06 NOTE — Telephone Encounter (Signed)
 Patient's daughter called back stating they were not told that the appointment that was made today was in [place] and they instead went to the [place] office. She states that by the time they were able to get to [facility] they were not able to be seen. Patient's daughter would like to know what can be done to assist with her mother's symptoms. Patient's daughter transferred to the office for further assistance.

## 2024-05-06 NOTE — Telephone Encounter (Signed)
 Please advise pt daughter requesting higher Dose of Prednisone  as per discussion last week with you.

## 2024-05-06 NOTE — Telephone Encounter (Signed)
 I did speak to Dr Darlean regarding this. Pt never was seen by Dr Darlean today so this would have to be advised by Dr Theophilus as she was suppose to see him today.

## 2024-05-06 NOTE — Telephone Encounter (Signed)
 Called patient's son who reports patient with worsening shortness of breath and wheezing despite nebulizers. Recently completed prednisone  taper one week ago. After stopping, symptoms gradually returned. Denies fevers, chills.  Will repeat treatment for COPD exacerbation. May need to consider chronic prednisone  therapy.

## 2024-05-06 NOTE — Telephone Encounter (Signed)
 FYI Only or Action Required?: Action required by provider: Refusing disposition, requesting call back to daughter in law at [phone].  Patient is followed in Pulmonology for COPD, last seen on 12/10/2023 by Kassie Acquanetta Bradley, MD.  Called Nurse Triage reporting [name]. - limited info given  Symptoms began yesterday.  Interventions attempted: Other: taking all her meds and uses more as needed.  Symptoms are: gradually worsening.  Triage Disposition: See HCP Within 4 Hours (Or PCP Triage)  Patient/caregiver understands and will follow disposition?: No, refuses disposition     Reason for Disposition  [1] MILD difficulty breathing (e.g., minimal/no SOB at rest, SOB with walking, pulse < 100) AND [2] NEW-onset or WORSE than normal  Additional Information  Commented on: Answer Assessment    EDIT: cardiac hx and lung hx significant  Pt daughter in law gave limited info  Pt daughter in law requesting call back from Dr. Kassie, not wanting to answer further questions, daughter in law frustrated should be in her chart, not with her right now, can get that info if you tell me  Advised pt be examined in next 4 hours Daughter in law ma'am I'm gonna stop you right there usually handle this over the phone, can't get her ready for appt in 4 hours     Advised pt be examined in next 4 hours and that can instead send message to pulm for call back to daughter in law at [phone] with further recommendations. Nurse was unable to give further care advice.  Protocols used: Breathing Difficulty-A-AH

## 2024-05-19 ENCOUNTER — Other Ambulatory Visit (HOSPITAL_BASED_OUTPATIENT_CLINIC_OR_DEPARTMENT_OTHER): Payer: Self-pay

## 2024-05-19 ENCOUNTER — Encounter (HOSPITAL_BASED_OUTPATIENT_CLINIC_OR_DEPARTMENT_OTHER): Payer: Self-pay | Admitting: Pulmonary Disease

## 2024-05-19 ENCOUNTER — Ambulatory Visit (HOSPITAL_BASED_OUTPATIENT_CLINIC_OR_DEPARTMENT_OTHER): Admitting: Pulmonary Disease

## 2024-05-19 VITALS — BP 138/67 | HR 81 | Ht 59.0 in | Wt 103.9 lb

## 2024-05-19 DIAGNOSIS — J449 Chronic obstructive pulmonary disease, unspecified: Secondary | ICD-10-CM | POA: Diagnosis not present

## 2024-05-19 DIAGNOSIS — J9611 Chronic respiratory failure with hypoxia: Secondary | ICD-10-CM

## 2024-05-19 MED ORDER — AZITHROMYCIN 250 MG PO TABS: 250.0000 mg | ORAL_TABLET | Freq: Every day | ORAL | 5 refills | Status: DC

## 2024-05-19 MED ORDER — FUROSEMIDE 20 MG PO TABS: 20.0000 mg | ORAL_TABLET | Freq: Every day | ORAL | 0 refills | Status: AC | PRN

## 2024-05-19 MED ORDER — PREDNISONE 10 MG PO TABS
ORAL_TABLET | ORAL | 0 refills | Status: AC
Start: 2024-05-19 — End: 2024-05-27

## 2024-05-19 NOTE — Progress Notes (Signed)
 Synopsis: Referred in 2017 for COPD by Rollene Almarie LABOR, *  Subjective:   PATIENT ID: Sherry Lambert GENDER: female DOB: December 07, 1940, MRN: [id number]  Chief Complaint  Patient presents with   COPD   Sherry Lambert is a 83 year old female former smoker with COPD, hx CVA in 2016 who presents for follow-up.   Synopsis: She is a former patient of Dr. Gretta. She was diagnosed with COPD in 2016. Her last COPD exacerbation was one year ago Feb 2021 requiring hospitalization. She transitioned from Trelegy to Anoro in March 2022 due to thrush and reports symptoms remain well-controlled.  12/20/20 Since our last visit, she reports her symptoms are well contolled on Anoro. Sherry Lambert has resolved off inhaled corticosteroids. After sitting three hours outside, she used her Albuterol  yesterday for shortness of breath and nasal congestion and believes this is related to the pollen. Otherwise she does routinely use her rescue inhaler. Denies wheezing or cough.  10/10/21 Since our last visit she is compliant with Anoro. She is ambulatory with a walker at home. In the last two weeks she has had worsening shortness of breath and has had to use albuterol  once a day. No coughing or wheezing. No fevers or chills. Her last exacerbation was in 05/2021 which was treated with doxycycline . She will do 30 min exercise twice a week with a TV program and the exercises will vary aerobic to strengthening.  02/26/22 Since our last visit she was hospitalized in June for pneumonia. No evidence of aspiration. Improved on antibiotics. [name] with exertion. No wheezing or cough.  08/16/22 Family is present and provides additional history. In the last several months she has had multiple exacerbations including in September, October and November (Augment/Doxy for pneumonia). Duonebs were added to her regimen. At the end of October was started on chronic prednisone  5 mg daily by Dr. Brenna. She continues to have  shortness of breath with minimal exertion. Denies cough or wheezing. She is compliant with her meds but does not feel it is effective and not able to inhale properly. Since her stroke she is weak and feels her breathing has declined with her strength.  11/12/22 Present with son who provides additional history. Since our last visit she has called the office to report the combo nebs are not effective and was seen in the ED on 10/29/22. Discharged with doxycycline , no new O2 requirements. Called office after hours on 11/04/22 and prescribed prednisone  40 mg x 5 days. Compliant with her nebulizer regimen but does feel that the steroid nebulizer causes a filmy taste. Son reports she is overall improved on this regimen with only shortness of breath on exertion. No cough or wheezing. Compliant with nocturnal oxygen .   02/11/23 She was doing well with her current nebulizer regimen. No further thrush after reducing Pulmicort  to daily. However unable to complete nystatin  due to individual packages - needing to cut with knife. In the last two days she is having shortness of breath. Denies wheezing or cough. Denies fevers, chills. Compliant with oxygen . Discussed goals of care and expresses she would want to be DNR. Family present in clinic today.  05/17/23 Since our last visit she feels overall well. Has shortness of breath with exertion. Denies cough, wheezing. On nebulizer regimen as documented below. Can use walker and does standing exercises for 20 min. Able to perform housework including washing dishes, laundry and some cooking. Showers without assistance. Daughter is working on ramp access to her home.  08/22/23 She reports some good days and bad days. [name]. Denies wheezing or cough. Good days are more often. Treated for COPD exacerbation around Thanksgiving and improved after prednisone . Son reports mobility is less and she is ad about lack of independence. Her residual symptoms are worsen.    12/10/23 Son present. Since our last visit she was treated for COPD exacerbation in March 2025. Continues to have shortness of breath. Denies wheezing or cough. Compliant with nebulizers and nightly oxygen . Seen by palliative care and appreciated their care.   05/19/24 Since our last visit she has been treated for exacerbation in May and prolonged course end of August/beginning of September. She completed 2nd course prednisone  and feels well. Son reports this is a better day for her but concerned about recurrent symptoms since she has had >3 exacerbations so far this year. She is compliant with nebulizers. Reports filmy mouth after using inhalers. Concerned about thrush.  Also has had recent mild leg swelling.  Prior inhalers: Trelegy - thrush  Social history: Quit in 2004. 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   [name]    Cataract    Chronic kidney disease    kidney stone   Colitis    Colon cancer (HCC) 06/2017   cecum   Diabetes mellitus without complication (HCC)    no meds taken   Emphysema of lung (HCC)    GERD (gastroesophageal reflux disease)    Hyperlipidemia    Hypertension    Peripheral arterial disease (HCC)    [name]    Stroke [facility])    November 2016- Thanksgiving night     Allergies  Allergen Reactions   Lipitor [Atorvastatin ] Itching     Outpatient Medications Prior to Visit  Medication Sig Dispense Refill   acetaminophen  (TYLENOL ) 500 MG tablet Take 500 mg by mouth daily.      albuterol  (VENTOLIN  HFA) 108 (90 Base) MCG/ACT inhaler Inhale 1-2 puffs into the lungs every 4 (four) hours as needed for wheezing or shortness of breath. 18 g 3   arformoterol  (BROVANA ) 15 MCG/2ML NEBU Take 2 mLs (15 mcg total) by nebulization 2 (two) times daily. 120 mL 11   aspirin  EC 81 MG tablet Take 1 tablet (81 mg total) by mouth daily. 90 tablet 3   budesonide  (PULMICORT ) 0.5 MG/2ML nebulizer  solution Take 2 mLs (0.5 mg total) by nebulization in the morning and at bedtime. 120 mL 11   gabapentin  (NEURONTIN ) 300 MG capsule Take 2 capsules by mouth twice daily 360 capsule 0   ipratropium-albuterol  (DUONEB) 0.5-2.5 (3) MG/3ML SOLN USE 1 AMPULE IN NEBULIZER EVERY 6 HOURS AS NEEDED 360 mL 2   mirabegron  ER (MYRBETRIQ ) 50 MG TB24 tablet Take 1 tablet (50 mg total) by mouth daily. 90 tablet 1   nystatin  (MYCOSTATIN ) 100000 UNIT/ML suspension Take 5 mLs (500,000 Units total) by mouth 4 (four) times daily. 140 mL 0   omeprazole  (PRILOSEC) 40 MG capsule Take 1 capsule by mouth once daily 90 capsule 0   Polyvinyl Alcohol -Povidone (REFRESH OP) Place 1 drop into both eyes daily as needed (For dry eyes).     pravastatin  (PRAVACHOL ) 20 MG tablet Take 1 tablet by mouth once daily 90 tablet 0   traMADol  (ULTRAM ) 50 MG tablet TAKE 1 TABLET BY MOUTH ONCE DAILY AS NEEDED 30 tablet 5   VITAMIN D  PO Take 1 tablet by mouth daily.     azithromycin  (  ZITHROMAX ) 250 MG tablet Take two tablets on day 1, then one tablet daily on day 2-5. 6 tablet 0   No facility-administered medications prior to visit.    Review of Systems  Constitutional:  Negative for chills, diaphoresis, fever, malaise/fatigue and weight loss.  HENT:  Negative for congestion.   Respiratory:  Positive for shortness of breath. Negative for cough, hemoptysis, sputum production and wheezing.   Cardiovascular:  Negative for chest pain, palpitations and leg swelling.   Objective:   Vitals:   05/19/24 1501  BP: 138/67  Pulse: 81  SpO2: 93%  Weight: 103 lb 14.4 oz (47.1 kg)  Height: 4' 11 (1.499 m)   93%  BMI Readings from Last 3 Encounters:  05/19/24 20.99 kg/m  02/10/24 20.40 kg/m  12/10/23 21.21 kg/m   Wt Readings from Last 3 Encounters:  05/19/24 103 lb 14.4 oz (47.1 kg)  02/10/24 101 lb (45.8 kg)  12/10/23 105 lb (47.6 kg)   Physical Exam: General: Frail and chronically ill-appearing, no acute distress HENT: [place], AT, OP  clear, MMM Eyes: EOMI, no scleral icterus Respiratory: Clear to auscultation bilaterally.  No crackles, wheezing or rales Cardiovascular: RRR, -M/R/G, no JVD Extremities:-Edema,-tenderness Neuro: AAO x4, CNII-XII grossly intact  Chest Imaging- films reviewed: CTA Chest 10/27/2019-emphysema, bilateral dependent pleural effusions.  No PE, no significant mediastinal or hilar adenopathy.  Hernia with stomach and abdominal fat contents protruding into the mediastinum. CXR 05/22/21 - COPD. No infiltrate effusion or edema CXR 02/26/22 - Resolution of lower lobe infiltrates CXR 10/29/22 - No acute cardiopulmonary disease  Pulmonary Functions Testing Results:    Latest Ref Rng & Units 11/01/2020    3:05 PM 02/13/2016    1:04 PM  PFT Results  FVC-Pre L 1.82  1.82   FVC-Predicted Pre % 82  76   FVC-Post L 1.80  2.02   FVC-Predicted Post % 81  84   Pre FEV1/FVC % % 48  45   Post FEV1/FCV % % 46  47   FEV1-Pre L 0.87  0.82   FEV1-Predicted Pre % 52  46   FEV1-Post L 0.83  0.94   DLCO uncorrected ml/min/mmHg 9.89  9.75   DLCO UNC% % 60  51   DLCO corrected ml/min/mmHg 9.89  9.26   DLCO COR %Predicted % 60  49   DLVA Predicted % 80  64   TLC L 5.04    TLC % Predicted % 113    RV % Predicted % 159     2017- severe obstruction with out significant bronchodilator reversibility.  Moderate diffusion impairment.  Flow volume loop supports obstruction.  2019 spirometry: FVC 1.8 (75%) FEV1 0.7 (39%) Ratio 38%  PFT 11/01/20 FVC 1.8 (81%) FEV1 0.83 (50%) Ratio 48  TLC 113% RV 159% DLCO 60% Interpretation: Moderately severe obstructive defect with air trapping and reduced DLCO consistent with emphysema.  ONO study 03/02/22 - SpO2 <88% 15 min 44 seconds. 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  Has frequent exacerbations and has had >3 exacerbations in 2025. Discussed clinical course and management of COPD including bronchodilator  regimen, preventive care including vaccinations and action plan for exacerbation as noted below.  Will add chronic macrolide.  COPD with emphysema -symptomatic --CONTINUE nebulizer regimen as follows:  --Pulmicort /Budesonide  ONE neb in the morning  --Brovana /aformoterol ONE neb in the morning and evening  --Duoneb/ipratropium-albuterol  ONE neb every morning and evening  --CONTINUE Albuterol   as needed for shortness of breath or wheezing --Not a candidate for pulmonary rehab right now. No longer working with PT --START azithromycin  250 mg ONE tablet a day. Check QTC at next visit --She would consider chronic prednisone  in the future  > Prednisone  pack ordered for when worsening shortness of breath, cough and wheezing  Leg swelling --START lasix  20 mg daily PRN.  -- If persistent will consider echocardiogram  Nocturnal hypoxemia --CONTINUE 1L O2 nightly for goal >88%. Last recertified 06/2024  GOC --Palliative care following.  Family recently not able to contact services.  If this remains an issue may need rereferral to a new palliative office --Discussed mechanical ventilation or CPR. Patient wishes to be DNR/DNI --Code status updated to DNR/DNI  Health Maintenance Immunization History  Administered Date(s) Administered   Fluad Quad(high Dose 65+) 06/18/2019, 07/13/2020, 07/19/2021, 07/02/2022   INFLUENZA, HIGH DOSE SEASONAL PF 07/27/2013, 05/14/2016, 05/20/2017, 06/05/2018   Influenza,inj,Quad PF,6+ Mos 08/03/2014, 06/21/2015   Influenza,inj,quad, With Preservative 06/04/2019   Influenza-Unspecified 07/04/2012, 06/18/2022, 07/22/2023   PFIZER(Purple Top)SARS-COV-2 Vaccination 11/17/2019, 12/08/2019, 08/17/2020   PNEUMOCOCCAL CONJUGATE-20 09/12/2023   Pneumococcal Conjugate-13 11/11/2015   Pneumococcal-Unspecified 09/03/2010   Td 09/03/2012   CT Lung Screening - not a candidate. Quit smoking > 15 years ago  No orders of the defined types were placed in this encounter.  Meds  ordered this encounter  Medications   azithromycin  (ZITHROMAX ) 250 MG tablet    Sig: Take 1 tablet (250 mg total) by mouth daily.    Dispense:  30 tablet    Refill:  5   furosemide  (LASIX ) 20 MG tablet    Sig: Take 1 tablet (20 mg total) by mouth daily as needed.    Dispense:  7 tablet    Refill:  0   predniSONE  (DELTASONE ) 10 MG tablet    Sig: Take 4 tablets (40 mg total) by mouth daily with breakfast for 2 days, THEN 3 tablets (30 mg total) daily with breakfast for 2 days, THEN 2 tablets (20 mg total) daily with breakfast for 2 days, THEN 1 tablet (10 mg total) daily with breakfast for 2 days.    Dispense:  20 tablet    Refill:  0   I have spent a total time of 35-minutes on the day of the appointment including chart review, data review, collecting history, coordinating care and discussing medical diagnosis and plan with the patient/family. Past medical history, allergies, medications were reviewed. Pertinent imaging, labs and tests included in this note have been reviewed and interpreted independently by me.  Tawn Fitzner Slater Staff, MD [facility] 05/19/2024 3:41 PM

## 2024-05-19 NOTE — Patient Instructions (Addendum)
 COPD with emphysema -symptomatic --CONTINUE nebulizer regimen as follows:  --Pulmicort /Budesonide  ONE neb in the morning  --Brovana /aformoterol ONE neb in the morning and evening  --Duoneb/ipratropium-albuterol  ONE neb every morning and evening  --CONTINUE Albuterol  as needed for shortness of breath or wheezing --Not a candidate for pulmonary rehab right now. No longer working with PT --START azithromycin  250 mg ONE tablet a day. Check QTC at next visit  Leg swelling --START lasix  20 mg daily

## 2024-05-28 DIAGNOSIS — J449 Chronic obstructive pulmonary disease, unspecified: Secondary | ICD-10-CM | POA: Diagnosis not present

## 2024-05-28 DIAGNOSIS — J189 Pneumonia, unspecified organism: Secondary | ICD-10-CM | POA: Diagnosis not present

## 2024-06-05 DIAGNOSIS — J449 Chronic obstructive pulmonary disease, unspecified: Secondary | ICD-10-CM | POA: Diagnosis not present

## 2024-06-05 DIAGNOSIS — J189 Pneumonia, unspecified organism: Secondary | ICD-10-CM | POA: Diagnosis not present

## 2024-06-16 ENCOUNTER — Telehealth (INDEPENDENT_AMBULATORY_CARE_PROVIDER_SITE_OTHER): Admitting: Pulmonary Disease

## 2024-06-16 ENCOUNTER — Ambulatory Visit (HOSPITAL_BASED_OUTPATIENT_CLINIC_OR_DEPARTMENT_OTHER): Admitting: Pulmonary Disease

## 2024-06-16 DIAGNOSIS — J441 Chronic obstructive pulmonary disease with (acute) exacerbation: Secondary | ICD-10-CM | POA: Diagnosis not present

## 2024-06-16 DIAGNOSIS — R600 Localized edema: Secondary | ICD-10-CM

## 2024-06-16 DIAGNOSIS — Z87891 Personal history of nicotine dependence: Secondary | ICD-10-CM | POA: Diagnosis not present

## 2024-06-16 DIAGNOSIS — R0902 Hypoxemia: Secondary | ICD-10-CM

## 2024-06-16 DIAGNOSIS — J449 Chronic obstructive pulmonary disease, unspecified: Secondary | ICD-10-CM

## 2024-06-16 MED ORDER — PREDNISONE 10 MG PO TABS: 10.0000 mg | ORAL_TABLET | Freq: Every day | ORAL | 2 refills | Status: DC

## 2024-06-16 NOTE — Progress Notes (Unsigned)
 Virtual Visit via Video Note  I connected with Sherry Lambert on 06/24/24 at  4:00 PM EDT by a video enabled telemedicine application and verified that I am speaking with the correct person using two identifiers.  Location: Patient: Home Provider: [facility]   I discussed the limitations of evaluation and management by telemedicine and the availability of in person appointments. The patient expressed understanding and agreed to proceed.   I discussed the assessment and treatment plan with the patient. The patient was provided an opportunity to ask questions and all were answered. The patient agreed with the plan and demonstrated an understanding of the instructions.   The patient was advised to call back or seek an in-person evaluation if the symptoms worsen or if the condition fails to improve as anticipated.  I provided 35 minutes of non-face-to-face time during this encounter.   Jayliah Benett Slater Staff, MD   Synopsis: Referred in 2017 for COPD by Rollene Almarie LABOR, *  Subjective:   PATIENT ID: Sherry Lambert GENDER: female DOB: 1940-09-17, MRN: [id number]  Chief Complaint  Patient presents with   Follow-up    Video visit, worsening shortness of breath   Ms. Marykate Heuberger is a 83 year old female former smoker with COPD, hx CVA in 2016 who presents for follow-up.   Synopsis: She is a former patient of Dr. Gretta. She was diagnosed with COPD in 2016. Her last COPD exacerbation was one year ago Feb 2021 requiring hospitalization. She transitioned from Trelegy to Anoro in March 2022 due to thrush and reports symptoms remain well-controlled.  12/20/20 Since our last visit, she reports her symptoms are well contolled on Anoro. Celestino has resolved off inhaled corticosteroids. After sitting three hours outside, she used her Albuterol  yesterday for shortness of breath and nasal congestion and believes this is related to the pollen. Otherwise she does routinely use her rescue inhaler.  Denies wheezing or cough.  10/10/21 Since our last visit she is compliant with Anoro. She is ambulatory with a walker at home. In the last two weeks she has had worsening shortness of breath and has had to use albuterol  once a day. No coughing or wheezing. No fevers or chills. Her last exacerbation was in 05/2021 which was treated with doxycycline . She will do 30 min exercise twice a week with a TV program and the exercises will vary aerobic to strengthening.  02/26/22 Since our last visit she was hospitalized in June for pneumonia. No evidence of aspiration. Improved on antibiotics. [name] with exertion. No wheezing or cough.  08/16/22 Family is present and provides additional history. In the last several months she has had multiple exacerbations including in September, October and November (Augment/Doxy for pneumonia). Duonebs were added to her regimen. At the end of October was started on chronic prednisone  5 mg daily by Dr. Brenna. She continues to have shortness of breath with minimal exertion. Denies cough or wheezing. She is compliant with her meds but does not feel it is effective and not able to inhale properly. Since her stroke she is weak and feels her breathing has declined with her strength.  11/12/22 Present with son who provides additional history. Since our last visit she has called the office to report the combo nebs are not effective and was seen in the ED on 10/29/22. Discharged with doxycycline , no new O2 requirements. Called office after hours on 11/04/22 and prescribed prednisone  40 mg x 5 days. Compliant with her nebulizer regimen but does  feel that the steroid nebulizer causes a filmy taste. Son reports she is overall improved on this regimen with only shortness of breath on exertion. No cough or wheezing. Compliant with nocturnal oxygen .   02/11/23 She was doing well with her current nebulizer regimen. No further thrush after reducing Pulmicort  to daily. However unable to  complete nystatin  due to individual packages - needing to cut with knife. In the last two days she is having shortness of breath. Denies wheezing or cough. Denies fevers, chills. Compliant with oxygen . Discussed goals of care and expresses she would want to be DNR. Family present in clinic today.  05/17/23 Since our last visit she feels overall well. Has shortness of breath with exertion. Denies cough, wheezing. On nebulizer regimen as documented below. Can use walker and does standing exercises for 20 min. Able to perform housework including washing dishes, laundry and some cooking. Showers without assistance. Daughter is working on ramp access to her home.   08/22/23 She reports some good days and bad days. [name]. Denies wheezing or cough. Good days are more often. Treated for COPD exacerbation around Thanksgiving and improved after prednisone . Son reports mobility is less and she is ad about lack of independence. Her residual symptoms are worsen.   12/10/23 Son present. Since our last visit she was treated for COPD exacerbation in March 2025. Continues to have shortness of breath. Denies wheezing or cough. Compliant with nebulizers and nightly oxygen . Seen by palliative care and appreciated their care.   05/19/24 Since our last visit she has been treated for exacerbation in May and prolonged course end of August/beginning of September. She completed 2nd course prednisone  and feels well. Son reports this is a better day for her but concerned about recurrent symptoms since she has had >3 exacerbations so far this year. She is compliant with nebulizers. Reports filmy mouth after using inhalers. Concerned about thrush.  Also has had recent mild leg swelling.  06/16/24 Since our last visit we started chronic macrolide however no effect. She has had worsening shortness of breath so started on prednisone  taper. Continue nebulizer. Lasix  was not effective and increased restroom use and dyspnea.  She is bed bound now due to weakness. Appetite is poor. Son is at bedside with her.  Prior inhalers: Trelegy - thrush  Social history: Quit in 2004. 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   [name]    Cataract    Chronic kidney disease    kidney stone   Colitis    Colon cancer (HCC) 06/2017   cecum   Diabetes mellitus without complication (HCC)    no meds taken   Emphysema of lung (HCC)    GERD (gastroesophageal reflux disease)    Hyperlipidemia    Hypertension    Peripheral arterial disease    [name]    Stroke [facility])    November 2016- Thanksgiving night     Allergies  Allergen Reactions   Lipitor [Atorvastatin ] Itching     Outpatient Medications Prior to Visit  Medication Sig Dispense Refill   acetaminophen  (TYLENOL ) 500 MG tablet Take 500 mg by mouth daily.      albuterol  (VENTOLIN  HFA) 108 (90 Base) MCG/ACT inhaler Inhale 1-2 puffs into the lungs every 4 (four) hours as needed for wheezing or shortness of breath. 18 g 3   arformoterol  (BROVANA ) 15 MCG/2ML NEBU Take 2 mLs (15 mcg total) by nebulization 2 (two)  times daily. 120 mL 11   aspirin  EC 81 MG tablet Take 1 tablet (81 mg total) by mouth daily. 90 tablet 3   azithromycin  (ZITHROMAX ) 250 MG tablet Take 1 tablet (250 mg total) by mouth daily. 30 tablet 5   budesonide  (PULMICORT ) 0.5 MG/2ML nebulizer solution Take 2 mLs (0.5 mg total) by nebulization in the morning and at bedtime. 120 mL 11   furosemide  (LASIX ) 20 MG tablet Take 1 tablet (20 mg total) by mouth daily as needed. 7 tablet 0   gabapentin  (NEURONTIN ) 300 MG capsule Take 2 capsules by mouth twice daily 360 capsule 0   ipratropium-albuterol  (DUONEB) 0.5-2.5 (3) MG/3ML SOLN USE 1 AMPULE IN NEBULIZER EVERY 6 HOURS AS NEEDED 360 mL 2   mirabegron  ER (MYRBETRIQ ) 50 MG TB24 tablet Take 1 tablet (50 mg total) by mouth daily. 90 tablet 1   nystatin  (MYCOSTATIN ) 100000 UNIT/ML suspension  Take 5 mLs (500,000 Units total) by mouth 4 (four) times daily. 140 mL 0   omeprazole  (PRILOSEC) 40 MG capsule Take 1 capsule by mouth once daily 90 capsule 0   Polyvinyl Alcohol -Povidone (REFRESH OP) Place 1 drop into both eyes daily as needed (For dry eyes).     pravastatin  (PRAVACHOL ) 20 MG tablet Take 1 tablet by mouth once daily 90 tablet 0   traMADol  (ULTRAM ) 50 MG tablet TAKE 1 TABLET BY MOUTH ONCE DAILY AS NEEDED 30 tablet 5   VITAMIN D  PO Take 1 tablet by mouth daily.     No facility-administered medications prior to visit.    Review of Systems  Constitutional:  Positive for malaise/fatigue. Negative for chills, diaphoresis, fever and weight loss.       Decreased appetite  HENT:  Negative for congestion.   Respiratory:  Positive for shortness of breath. Negative for cough, hemoptysis, sputum production and wheezing.   Cardiovascular:  Negative for chest pain, palpitations and leg swelling.   Objective:   There were no vitals filed for this visit.     BMI Readings from Last 3 Encounters:  05/19/24 20.99 kg/m  02/10/24 20.40 kg/m  12/10/23 21.21 kg/m   Wt Readings from Last 3 Encounters:  05/19/24 103 lb 14.4 oz (47.1 kg)  02/10/24 101 lb (45.8 kg)  12/10/23 105 lb (47.6 kg)   Physical Exam: General: Frail and chronically ill-appearing, no acute distress, laying in bed HENT: [place], AT, OP clear, MMM Eyes: EOMI, no scleral icterus Neuro: AAO x4, CNII-XII grossly intact  Chest Imaging- films reviewed: CTA Chest 10/27/2019-emphysema, bilateral dependent pleural effusions.  No PE, no significant mediastinal or hilar adenopathy.  Hernia with stomach and abdominal fat contents protruding into the mediastinum. CXR 05/22/21 - COPD. No infiltrate effusion or edema CXR 02/26/22 - Resolution of lower lobe infiltrates CXR 10/29/22 - No acute cardiopulmonary disease  Pulmonary Functions Testing Results:    Latest Ref Rng & Units 11/01/2020    3:05 PM 02/13/2016    1:04 PM  PFT  Results  FVC-Pre L 1.82  1.82   FVC-Predicted Pre % 82  76   FVC-Post L 1.80  2.02   FVC-Predicted Post % 81  84   Pre FEV1/FVC % % 48  45   Post FEV1/FCV % % 46  47   FEV1-Pre L 0.87  0.82   FEV1-Predicted Pre % 52  46   FEV1-Post L 0.83  0.94   DLCO uncorrected ml/min/mmHg 9.89  9.75   DLCO UNC% % 60  51   DLCO corrected ml/min/mmHg 9.89  9.26   DLCO COR %Predicted % 60  49   DLVA Predicted % 80  64   TLC L 5.04    TLC % Predicted % 113    RV % Predicted % 159     2017- severe obstruction with out significant bronchodilator reversibility.  Moderate diffusion impairment.  Flow volume loop supports obstruction.  2019 spirometry: FVC 1.8 (75%) FEV1 0.7 (39%) Ratio 38%  PFT 11/01/20 FVC 1.8 (81%) FEV1 0.83 (50%) Ratio 48  TLC 113% RV 159% DLCO 60% Interpretation: Moderately severe obstructive defect with air trapping and reduced DLCO consistent with emphysema.  ONO study 03/02/22 - SpO2 <88% 15 min 44 seconds. Nadir SpO2 83%     Assessment & Plan:  83  year old female with COPD with emphysema, nocturnal hypoxemia, HTN, DM2, HLD  who presents for follow-up. Has had >3 exacerbations in 2025 and currently in exacerbation. Macrolide was ineffective. Discussed clinical course and management of COPD including bronchodilator regimen, preventive care and action plan for exacerbation. Will treat for exacerbation and after discussion of risks and benefits will start chronic prednisone  treatment  COPD with emphysema COPD exacerbation  --CONTINUE nebulizer regimen as follows:  --Pulmicort /Budesonide  ONE neb in the morning  --Brovana /aformoterol ONE neb in the morning and evening  --Duoneb/ipratropium-albuterol  ONE neb every morning and evening  --CONTINUE Albuterol  as needed for shortness of breath or wheezing --Not a candidate for pulmonary rehab right now. No longer working with PT --STOP azithromycin   --Complete prednisone  taper then, START prednisone  10 mg daily --If symptoms  worsen, recommend ED evaluation --RE-REFER to [facility]  Leg swelling - intermittent --STOP lasix   Nocturnal hypoxemia --CONTINUE 1L O2 nightly for goal >88%. Last recertified 06/2024  GOC --Palliative care following.  Family recently not able to contact services.  If this remains an issue may need rereferral to a new palliative office --Discussed mechanical ventilation or CPR. Patient wishes to be DNR/DNI --Code status updated to DNR/DNI --RE-REFER to [facility]   Health Maintenance Immunization History  Administered Date(s) Administered   Fluad Quad(high Dose 65+) 06/18/2019, 07/13/2020, 07/19/2021, 07/02/2022   INFLUENZA, HIGH DOSE SEASONAL PF 07/27/2013, 05/14/2016, 05/20/2017, 06/05/2018   Influenza,inj,Quad PF,6+ Mos 08/03/2014, 06/21/2015   Influenza,inj,quad, With Preservative 06/04/2019   Influenza-Unspecified 07/04/2012, 06/18/2022, 07/22/2023   PFIZER(Purple Top)SARS-COV-2 Vaccination 11/17/2019, 12/08/2019, 08/17/2020   PNEUMOCOCCAL CONJUGATE-20 09/12/2023   Pneumococcal Conjugate-13 11/11/2015   Pneumococcal-Unspecified 09/03/2010   Td 09/03/2012   CT Lung Screening - not a candidate. Quit smoking > 15 years ago  Orders Placed This Encounter  Procedures   Amb Referral to Palliative Care    Referral Priority:   Urgent    Referral Type:   Consultation    Referral Reason:   Symptom Managment    Number of Visits Requested:   1   Meds ordered this encounter  Medications   predniSONE  (DELTASONE ) 10 MG tablet    Sig: Take 1 tablet (10 mg total) by mouth daily with breakfast.    Dispense:  30 tablet    Refill:  2   I have spent a total time of 35-minutes on the day of the appointment including chart review, data review, collecting history, coordinating care and discussing medical diagnosis and plan with the patient/family. Past medical history, allergies, medications were reviewed. Pertinent imaging, labs and tests included in this note have been reviewed and  interpreted independently by me.  Kelcie Currie Slater Staff, MD [facility] 06/16/2024 4:19 PM

## 2024-06-22 ENCOUNTER — Other Ambulatory Visit: Payer: Self-pay | Admitting: Internal Medicine

## 2024-06-24 ENCOUNTER — Encounter (HOSPITAL_BASED_OUTPATIENT_CLINIC_OR_DEPARTMENT_OTHER): Payer: Self-pay | Admitting: Pulmonary Disease

## 2024-06-27 DIAGNOSIS — J189 Pneumonia, unspecified organism: Secondary | ICD-10-CM | POA: Diagnosis not present

## 2024-07-06 DIAGNOSIS — J449 Chronic obstructive pulmonary disease, unspecified: Secondary | ICD-10-CM | POA: Diagnosis not present

## 2024-07-06 DIAGNOSIS — J189 Pneumonia, unspecified organism: Secondary | ICD-10-CM | POA: Diagnosis not present

## 2024-07-08 ENCOUNTER — Telehealth (HOSPITAL_BASED_OUTPATIENT_CLINIC_OR_DEPARTMENT_OTHER): Payer: Self-pay

## 2024-07-08 DIAGNOSIS — J432 Centrilobular emphysema: Secondary | ICD-10-CM

## 2024-07-08 NOTE — Telephone Encounter (Unsigned)
 Copied from CRM [id number]. Topic: Clinical - Prescription Issue >> Jul 08, 2024  2:34 PM Isabell A wrote: Reason for CRM: Patient states she received a letter from her insurance stating they wont cover arformoterol  (BROVANA ) 15 MCG/2ML NEBU [[id number]] anymore. Patient is concerned and would like to know what she should do.   Callback number: [phone]

## 2024-07-09 ENCOUNTER — Other Ambulatory Visit (HOSPITAL_COMMUNITY): Payer: Self-pay

## 2024-07-09 NOTE — Telephone Encounter (Signed)
 [facility] staff - please notify patient that for now her arformoterol  (Brovana ) is still covered. Once her insurance formally declines, we will seek alternatives.  Pharmacy team - are there any alternatives for nebulized LABA that are covered so we know what to send in for the future?

## 2024-07-10 MED ORDER — ARFORMOTEROL TARTRATE 15 MCG/2ML IN NEBU: 15.0000 ug | INHALATION_SOLUTION | Freq: Two times a day (BID) | RESPIRATORY_TRACT | 11 refills | Status: DC

## 2024-07-10 NOTE — Telephone Encounter (Signed)
 Called and spoke to patient refilling neb per patient request NF[facility]

## 2024-07-27 ENCOUNTER — Other Ambulatory Visit: Payer: Self-pay | Admitting: Internal Medicine

## 2024-07-28 DIAGNOSIS — J189 Pneumonia, unspecified organism: Secondary | ICD-10-CM | POA: Diagnosis not present

## 2024-08-01 ENCOUNTER — Other Ambulatory Visit: Payer: Self-pay | Admitting: Internal Medicine

## 2024-08-03 ENCOUNTER — Ambulatory Visit (HOSPITAL_BASED_OUTPATIENT_CLINIC_OR_DEPARTMENT_OTHER): Admitting: Pulmonary Disease

## 2024-08-05 ENCOUNTER — Telehealth: Payer: Self-pay

## 2024-08-05 DIAGNOSIS — J189 Pneumonia, unspecified organism: Secondary | ICD-10-CM | POA: Diagnosis not present

## 2024-08-05 DIAGNOSIS — J449 Chronic obstructive pulmonary disease, unspecified: Secondary | ICD-10-CM | POA: Diagnosis not present

## 2024-08-05 NOTE — Telephone Encounter (Signed)
 Copied from CRM #[id number]. Topic: Clinical - Medication Question >> Aug 05, 2024 10:41 AM Thersia BROCKS wrote: Reason for CRM: Patient called in regarding her prescription gabapentin  (NEURONTIN ) 300 MG capsule , wanted to know if there is a pill she can take instead of a capsule would like a callback

## 2024-08-10 ENCOUNTER — Other Ambulatory Visit: Payer: Self-pay | Admitting: Internal Medicine

## 2024-08-10 NOTE — Telephone Encounter (Signed)
 Spoke with pt and informed her per Dr. Rollene, dose does not come in pill form. Pt stated that it is difficult for her to swallow capsules so she has been taking it with applesauce. This seems to be working. Advised pt to contact office if taking with applesauce is no longer helpful. Pt verbalized understanding.

## 2024-08-10 NOTE — Telephone Encounter (Signed)
 I don't think they make a tablet in her dose.

## 2024-08-11 ENCOUNTER — Ambulatory Visit (HOSPITAL_BASED_OUTPATIENT_CLINIC_OR_DEPARTMENT_OTHER): Admitting: Pulmonary Disease

## 2024-08-11 ENCOUNTER — Encounter (HOSPITAL_BASED_OUTPATIENT_CLINIC_OR_DEPARTMENT_OTHER): Payer: Self-pay | Admitting: Pulmonary Disease

## 2024-08-11 VITALS — BP 126/40 | HR 95 | Ht 59.0 in | Wt 103.0 lb

## 2024-08-11 DIAGNOSIS — J432 Centrilobular emphysema: Secondary | ICD-10-CM

## 2024-08-11 DIAGNOSIS — R0902 Hypoxemia: Secondary | ICD-10-CM | POA: Diagnosis not present

## 2024-08-11 DIAGNOSIS — Z87891 Personal history of nicotine dependence: Secondary | ICD-10-CM | POA: Diagnosis not present

## 2024-08-11 DIAGNOSIS — J439 Emphysema, unspecified: Secondary | ICD-10-CM | POA: Diagnosis not present

## 2024-08-11 MED ORDER — PREDNISONE 10 MG PO TABS: 10.0000 mg | ORAL_TABLET | Freq: Every day | ORAL | 3 refills | Status: AC

## 2024-08-11 NOTE — Progress Notes (Unsigned)
 Synopsis: Referred in 2017 for COPD by Rollene Almarie LABOR, *  Subjective:   PATIENT ID: Sherry Lambert GENDER: female DOB: 1941/04/19, MRN: [id number]  Chief Complaint  Patient presents with   COPD   [name] is a 83 year old female former smoker with COPD, hx CVA in 2016 who presents for follow-up.   Synopsis: She is a former patient of Dr. Gretta. She was diagnosed with COPD in 2016. Her last COPD exacerbation was one year ago Feb 2021 requiring hospitalization. She transitioned from Trelegy to Anoro in March 2022 due to thrush and reports symptoms remain well-controlled.  12/20/20 Since our last visit, she reports her symptoms are well contolled on Anoro. Celestino has resolved off inhaled corticosteroids. After sitting three hours outside, she used her Albuterol  yesterday for shortness of breath and nasal congestion and believes this is related to the pollen. Otherwise she does routinely use her rescue inhaler. Denies wheezing or cough.  10/10/21 Since our last visit she is compliant with Anoro. She is ambulatory with a walker at home. In the last two weeks she has had worsening shortness of breath and has had to use albuterol  once a day. No coughing or wheezing. No fevers or chills. Her last exacerbation was in 05/2021 which was treated with doxycycline . She will do 30 min exercise twice a week with a TV program and the exercises will vary aerobic to strengthening.  02/26/22 Since our last visit she was hospitalized in June for pneumonia. No evidence of aspiration. Improved on antibiotics. [name] with exertion. No wheezing or cough.  08/16/22 Family is present and provides additional history. In the last several months she has had multiple exacerbations including in September, October and November (Augment/Doxy for pneumonia). Duonebs were added to her regimen. At the end of October was started on chronic prednisone  5 mg daily by Dr. Brenna. She continues to have  shortness of breath with minimal exertion. Denies cough or wheezing. She is compliant with her meds but does not feel it is effective and not able to inhale properly. Since her stroke she is weak and feels her breathing has declined with her strength.  11/12/22 Present with son who provides additional history. Since our last visit she has called the office to report the combo nebs are not effective and was seen in the ED on 10/29/22. Discharged with doxycycline , no new O2 requirements. Called office after hours on 11/04/22 and prescribed prednisone  40 mg x 5 days. Compliant with her nebulizer regimen but does feel that the steroid nebulizer causes a filmy taste. Son reports she is overall improved on this regimen with only shortness of breath on exertion. No cough or wheezing. Compliant with nocturnal oxygen .   02/11/23 She was doing well with her current nebulizer regimen. No further thrush after reducing Pulmicort  to daily. However unable to complete nystatin  due to individual packages - needing to cut with knife. In the last two days she is having shortness of breath. Denies wheezing or cough. Denies fevers, chills. Compliant with oxygen . Discussed goals of care and expresses she would want to be DNR. Family present in clinic today.  05/17/23 Since our last visit she feels overall well. Has shortness of breath with exertion. Denies cough, wheezing. On nebulizer regimen as documented below. Can use walker and does standing exercises for 20 min. Able to perform housework including washing dishes, laundry and some cooking. Showers without assistance. Daughter is working on ramp access to her home.  08/22/23 She reports some good days and bad days. [name]. Denies wheezing or cough. Good days are more often. Treated for COPD exacerbation around Thanksgiving and improved after prednisone . Son reports mobility is less and she is ad about lack of independence. Her residual symptoms are worsen.    12/10/23 Son present. Since our last visit she was treated for COPD exacerbation in March 2025. Continues to have shortness of breath. Denies wheezing or cough. Compliant with nebulizers and nightly oxygen . Seen by palliative care and appreciated their care.   05/19/24 Since our last visit she has been treated for exacerbation in May and prolonged course end of August/beginning of September. She completed 2nd course prednisone  and feels well. Son reports this is a better day for her but concerned about recurrent symptoms since she has had >3 exacerbations so far this year. She is compliant with nebulizers. Reports filmy mouth after using inhalers. Concerned about thrush.  Also has had recent mild leg swelling.  08/11/24 Since our last visit she is having worsening shortness of breath with any physical activity including transferring to wheelchair. Occasional phlegm. Denies chronic cough or wheezing. Son provides additional history. She is fatigued with any activity.   Prior inhalers: Trelegy - thrush  Social history: Quit in 2004. 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   [name]    Cataract    Chronic kidney disease    kidney stone   Colitis    Colon cancer (HCC) 06/2017   cecum   Diabetes mellitus without complication (HCC)    no meds taken   Emphysema of lung (HCC)    GERD (gastroesophageal reflux disease)    Hyperlipidemia    Hypertension    Peripheral arterial disease    [name]    Stroke [facility])    November 2016- Thanksgiving night     Allergies  Allergen Reactions   Lipitor [Atorvastatin ] Itching     Outpatient Medications Prior to Visit  Medication Sig Dispense Refill   acetaminophen  (TYLENOL ) 500 MG tablet Take 500 mg by mouth daily.      albuterol  (VENTOLIN  HFA) 108 (90 Base) MCG/ACT inhaler Inhale 1-2 puffs into the lungs every 4 (four) hours as needed for wheezing or shortness of breath.  18 g 3   arformoterol  (BROVANA ) 15 MCG/2ML NEBU Take 2 mLs (15 mcg total) by nebulization 2 (two) times daily. 120 mL 11   aspirin  EC 81 MG tablet Take 1 tablet (81 mg total) by mouth daily. 90 tablet 3   azithromycin  (ZITHROMAX ) 250 MG tablet Take 1 tablet (250 mg total) by mouth daily. 30 tablet 5   budesonide  (PULMICORT ) 0.5 MG/2ML nebulizer solution Take 2 mLs (0.5 mg total) by nebulization in the morning and at bedtime. 120 mL 11   furosemide  (LASIX ) 20 MG tablet Take 1 tablet (20 mg total) by mouth daily as needed. 7 tablet 0   gabapentin  (NEURONTIN ) 300 MG capsule Take 2 capsules by mouth twice daily 360 capsule 0   ipratropium-albuterol  (DUONEB) 0.5-2.5 (3) MG/3ML SOLN USE 1 AMPULE IN NEBULIZER EVERY 6 HOURS AS NEEDED 360 mL 2   mirabegron  ER (MYRBETRIQ ) 50 MG TB24 tablet Take 1 tablet (50 mg total) by mouth daily. 90 tablet 1   nystatin  (MYCOSTATIN ) 100000 UNIT/ML suspension Take 5 mLs (500,000 Units total) by mouth 4 (four) times daily. 140 mL 0   omeprazole  (PRILOSEC) 40 MG capsule Take 1 capsule by  mouth once daily 90 capsule 0   Polyvinyl Alcohol -Povidone (REFRESH OP) Place 1 drop into both eyes daily as needed (For dry eyes).     pravastatin  (PRAVACHOL ) 20 MG tablet Take 1 tablet by mouth once daily 90 tablet 0   predniSONE  (DELTASONE ) 10 MG tablet Take 1 tablet (10 mg total) by mouth daily with breakfast. 30 tablet 2   traMADol  (ULTRAM ) 50 MG tablet TAKE 1 TABLET BY MOUTH ONCE DAILY AS NEEDED 30 tablet 5   VITAMIN D  PO Take 1 tablet by mouth daily.     No facility-administered medications prior to visit.    Review of Systems  Constitutional:  Negative for chills, diaphoresis, fever, malaise/fatigue and weight loss.  HENT:  Negative for congestion.   Respiratory:  Positive for shortness of breath. Negative for cough, hemoptysis, sputum production and wheezing.   Cardiovascular:  Negative for chest pain, palpitations and leg swelling.   Objective:   Vitals:   08/11/24 1522   BP: (!) 126/40  Pulse: 95  SpO2: 93%  Weight: 103 lb (46.7 kg)  Height: 4' 11 (1.499 m)   93%  BMI Readings from Last 3 Encounters:  08/11/24 20.80 kg/m  05/19/24 20.99 kg/m  02/10/24 20.40 kg/m   Wt Readings from Last 3 Encounters:  08/11/24 103 lb (46.7 kg)  05/19/24 103 lb 14.4 oz (47.1 kg)  02/10/24 101 lb (45.8 kg)   Physical Exam: General: Frail and chronically ill-appearing, no acute distress HENT: [place], AT, OP clear, MMM Eyes: EOMI, no scleral icterus Respiratory: Clear to auscultation bilaterally.  No crackles, wheezing or rales Cardiovascular: RRR, -M/R/G, no JVD Extremities:-Edema,-tenderness Neuro: AAO x4, CNII-XII grossly intact  Chest Imaging- films reviewed: CTA Chest 10/27/2019-emphysema, bilateral dependent pleural effusions.  No PE, no significant mediastinal or hilar adenopathy.  Hernia with stomach and abdominal fat contents protruding into the mediastinum. CXR 05/22/21 - COPD. No infiltrate effusion or edema CXR 02/26/22 - Resolution of lower lobe infiltrates CXR 10/29/22 - No acute cardiopulmonary disease  Pulmonary Functions Testing Results:    Latest Ref Rng & Units 11/01/2020    3:05 PM 02/13/2016    1:04 PM  PFT Results  FVC-Pre L 1.82  1.82   FVC-Predicted Pre % 82  76   FVC-Post L 1.80  2.02   FVC-Predicted Post % 81  84   Pre FEV1/FVC % % 48  45   Post FEV1/FCV % % 46  47   FEV1-Pre L 0.87  0.82   FEV1-Predicted Pre % 52  46   FEV1-Post L 0.83  0.94   DLCO uncorrected ml/min/mmHg 9.89  9.75   DLCO UNC% % 60  51   DLCO corrected ml/min/mmHg 9.89  9.26   DLCO COR %Predicted % 60  49   DLVA Predicted % 80  64   TLC L 5.04    TLC % Predicted % 113    RV % Predicted % 159     2017- severe obstruction with out significant bronchodilator reversibility.  Moderate diffusion impairment.  Flow volume loop supports obstruction.  2019 spirometry: FVC 1.8 (75%) FEV1 0.7 (39%) Ratio 38%  PFT 11/01/20 FVC 1.8 (81%) FEV1 0.83 (50%) Ratio 48  TLC  113% RV 159% DLCO 60% Interpretation: Moderately severe obstructive defect with air trapping and reduced DLCO consistent with emphysema.  ONO study 03/02/22 - SpO2 <88% 15 min 44 seconds. 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  Has frequent exacerbations and has had >3 exacerbations in 2025. Discussed clinical course and management of COPD including bronchodilator regimen, preventive care including vaccinations and action plan for exacerbation as noted below.  Will add chronic macrolide.  COPD with emphysema -symptomatic --CONTINUE nebulizer regimen as follows:  --Pulmicort /Budesonide  ONE neb in the morning  --Brovana /aformoterol ONE neb in the morning and evening. Will need to investigate what will be covered when January 1 occurs  --Duoneb/ipratropium-albuterol  ONE neb every morning and evening  --Consider Ohtuvayre  next year --Encourage exercise with sit and stand and incentive spirometry at least 5x a day  --CONTINUE Albuterol  as needed for shortness of breath or wheezing --Not a candidate for pulmonary rehab right now. No longer working with PT --D/c'd azithromycin  due to ineffectiveness --CONTINUE prednisone  10 mg daily  Nocturnal hypoxemia --CONTINUE 1L O2 nightly for goal >88%. Last recertified 06/2024  GOC --Palliative care following.  Family recently not able to contact services.  If this remains an issue may need rereferral to a new palliative office --Discussed mechanical ventilation or CPR. Patient wishes to be DNR/DNI --Code status updated to DNR/DNI  Health Maintenance Immunization History  Administered Date(s) Administered   Fluad Quad(high Dose 65+) 06/18/2019, 07/13/2020, 07/19/2021, 07/02/2022   INFLUENZA, HIGH DOSE SEASONAL PF 07/27/2013, 05/14/2016, 05/20/2017, 06/05/2018   Influenza,inj,Quad PF,6+ Mos 08/03/2014, 06/21/2015   Influenza,inj,quad, With Preservative 06/04/2019    Influenza-Unspecified 07/04/2012, 06/18/2022, 07/22/2023   PFIZER(Purple Top)SARS-COV-2 Vaccination 11/17/2019, 12/08/2019, 08/17/2020   PNEUMOCOCCAL CONJUGATE-20 09/12/2023   Pneumococcal Conjugate-13 11/11/2015   Pneumococcal-Unspecified 09/03/2010   Td 09/03/2012   CT Lung Screening - not a candidate. Quit smoking > 15 years ago  No orders of the defined types were placed in this encounter.  No orders of the defined types were placed in this encounter.  I have spent a total time of 35-minutes on the day of the appointment including chart review, data review, collecting history, coordinating care and discussing medical diagnosis and plan with the patient/family. Past medical history, allergies, medications were reviewed. Pertinent imaging, labs and tests included in this note have been reviewed and interpreted independently by me.  Quantavious Eggert Slater Staff, MD [facility] 08/11/2024 3:48 PM

## 2024-08-11 NOTE — Patient Instructions (Signed)
 COPD with emphysema -symptomatic --CONTINUE nebulizer regimen as follows:  --Pulmicort /Budesonide  ONE neb in the morning  --Brovana /aformoterol ONE neb in the morning and evening. Will need to investigate what will be covered when January 1 occurs  --Duoneb/ipratropium-albuterol  ONE neb every morning and evening  --Consider Ohtuvayre  next year --Encourage exercise with sit and stand and incentive spirometry at least 5x a day --CONTINUE Albuterol  as needed for shortness of breath or wheezing --CONTINUE prednisone  10 mg daily

## 2024-08-17 ENCOUNTER — Ambulatory Visit: Admitting: Internal Medicine

## 2024-08-18 ENCOUNTER — Telehealth (HOSPITAL_BASED_OUTPATIENT_CLINIC_OR_DEPARTMENT_OTHER): Payer: Self-pay

## 2024-08-18 DIAGNOSIS — J449 Chronic obstructive pulmonary disease, unspecified: Secondary | ICD-10-CM

## 2024-08-18 DIAGNOSIS — J432 Centrilobular emphysema: Secondary | ICD-10-CM

## 2024-08-18 DIAGNOSIS — J9611 Chronic respiratory failure with hypoxia: Secondary | ICD-10-CM

## 2024-08-18 NOTE — Telephone Encounter (Signed)
 Spoke with [employer] and pt is not eligible for nebulizer until 10/2024. Spoke with pts son and her machine is not producing air as it should he has replaced tubing and filters with no luck. I did advise him that we could try to order replacement but that it may not be covered by insurance. Order placed. NFN     Copied from CRM [id number]. Topic: Clinical - Prescription Issue >> Aug 18, 2024 11:30 AM Devaughn RAMAN wrote: Reason for CRM: Pt called stating she needs a new nebulizer but Dr.Ellison has to call and ok it. Pt stated Dr.Ellison advised her she would be waiting on her call.

## 2024-08-20 DIAGNOSIS — J189 Pneumonia, unspecified organism: Secondary | ICD-10-CM | POA: Diagnosis not present

## 2024-08-20 DIAGNOSIS — J449 Chronic obstructive pulmonary disease, unspecified: Secondary | ICD-10-CM | POA: Diagnosis not present

## 2024-08-27 DIAGNOSIS — J189 Pneumonia, unspecified organism: Secondary | ICD-10-CM | POA: Diagnosis not present

## 2024-08-27 DIAGNOSIS — J449 Chronic obstructive pulmonary disease, unspecified: Secondary | ICD-10-CM | POA: Diagnosis not present

## 2024-09-04 ENCOUNTER — Encounter: Payer: Self-pay | Admitting: *Deleted

## 2024-09-04 NOTE — Progress Notes (Signed)
 Sherry Lambert                                          MRN: [id number]   09/04/2024   The [facility] Quality Team Specialist reviewed this patient medical record for the purposes of chart review for care gap closure. The following were reviewed: chart review for care gap closure-kidney health evaluation for diabetes:eGFR  and uACR.    [facility]BCI Quality Team

## 2024-09-08 ENCOUNTER — Emergency Department (HOSPITAL_BASED_OUTPATIENT_CLINIC_OR_DEPARTMENT_OTHER)

## 2024-09-08 ENCOUNTER — Inpatient Hospital Stay (HOSPITAL_BASED_OUTPATIENT_CLINIC_OR_DEPARTMENT_OTHER)
Admission: EM | Admit: 2024-09-08 | Discharge: 2024-09-11 | DRG: 811 | Disposition: A | Discharge status: Home Health | Source: Ambulatory Visit | Attending: Internal Medicine | Admitting: Internal Medicine

## 2024-09-08 ENCOUNTER — Encounter: Payer: Self-pay | Admitting: Internal Medicine

## 2024-09-08 ENCOUNTER — Telehealth: Payer: Self-pay

## 2024-09-08 ENCOUNTER — Ambulatory Visit: Payer: Self-pay

## 2024-09-08 ENCOUNTER — Ambulatory Visit: Admitting: Internal Medicine

## 2024-09-08 ENCOUNTER — Encounter (HOSPITAL_BASED_OUTPATIENT_CLINIC_OR_DEPARTMENT_OTHER): Payer: Self-pay | Admitting: *Deleted

## 2024-09-08 ENCOUNTER — Other Ambulatory Visit: Payer: Self-pay

## 2024-09-08 VITALS — BP 130/60 | HR 98 | Temp 98.1°F | Ht 59.0 in | Wt 105.0 lb

## 2024-09-08 DIAGNOSIS — Z90710 Acquired absence of both cervix and uterus: Secondary | ICD-10-CM

## 2024-09-08 DIAGNOSIS — G8929 Other chronic pain: Secondary | ICD-10-CM | POA: Diagnosis present

## 2024-09-08 DIAGNOSIS — E785 Hyperlipidemia, unspecified: Secondary | ICD-10-CM | POA: Diagnosis not present

## 2024-09-08 DIAGNOSIS — I119 Hypertensive heart disease without heart failure: Secondary | ICD-10-CM | POA: Diagnosis present

## 2024-09-08 DIAGNOSIS — J418 Mixed simple and mucopurulent chronic bronchitis: Secondary | ICD-10-CM

## 2024-09-08 DIAGNOSIS — Z85038 Personal history of other malignant neoplasm of large intestine: Secondary | ICD-10-CM | POA: Diagnosis not present

## 2024-09-08 DIAGNOSIS — Z6821 Body mass index (BMI) 21.0-21.9, adult: Secondary | ICD-10-CM | POA: Diagnosis not present

## 2024-09-08 DIAGNOSIS — R634 Abnormal weight loss: Secondary | ICD-10-CM | POA: Diagnosis present

## 2024-09-08 DIAGNOSIS — R0609 Other forms of dyspnea: Secondary | ICD-10-CM | POA: Diagnosis not present

## 2024-09-08 DIAGNOSIS — Z8673 Personal history of transient ischemic attack (TIA), and cerebral infarction without residual deficits: Secondary | ICD-10-CM

## 2024-09-08 DIAGNOSIS — E7849 Other hyperlipidemia: Secondary | ICD-10-CM | POA: Diagnosis present

## 2024-09-08 DIAGNOSIS — Q394 Esophageal web: Secondary | ICD-10-CM | POA: Diagnosis not present

## 2024-09-08 DIAGNOSIS — K449 Diaphragmatic hernia without obstruction or gangrene: Secondary | ICD-10-CM | POA: Diagnosis present

## 2024-09-08 DIAGNOSIS — E118 Type 2 diabetes mellitus with unspecified complications: Secondary | ICD-10-CM

## 2024-09-08 DIAGNOSIS — Z8249 Family history of ischemic heart disease and other diseases of the circulatory system: Secondary | ICD-10-CM

## 2024-09-08 DIAGNOSIS — I10 Essential (primary) hypertension: Secondary | ICD-10-CM | POA: Diagnosis not present

## 2024-09-08 DIAGNOSIS — K222 Esophageal obstruction: Secondary | ICD-10-CM | POA: Diagnosis present

## 2024-09-08 DIAGNOSIS — Z833 Family history of diabetes mellitus: Secondary | ICD-10-CM

## 2024-09-08 DIAGNOSIS — D62 Acute posthemorrhagic anemia: Principal | ICD-10-CM | POA: Diagnosis present

## 2024-09-08 DIAGNOSIS — K2950 Unspecified chronic gastritis without bleeding: Secondary | ICD-10-CM | POA: Diagnosis not present

## 2024-09-08 DIAGNOSIS — R0602 Shortness of breath: Secondary | ICD-10-CM | POA: Diagnosis present

## 2024-09-08 DIAGNOSIS — K2970 Gastritis, unspecified, without bleeding: Secondary | ICD-10-CM | POA: Diagnosis present

## 2024-09-08 DIAGNOSIS — Z515 Encounter for palliative care: Secondary | ICD-10-CM | POA: Diagnosis not present

## 2024-09-08 DIAGNOSIS — Z0000 Encounter for general adult medical examination without abnormal findings: Secondary | ICD-10-CM

## 2024-09-08 DIAGNOSIS — E1151 Type 2 diabetes mellitus with diabetic peripheral angiopathy without gangrene: Secondary | ICD-10-CM | POA: Diagnosis present

## 2024-09-08 DIAGNOSIS — J439 Emphysema, unspecified: Secondary | ICD-10-CM | POA: Diagnosis present

## 2024-09-08 DIAGNOSIS — J449 Chronic obstructive pulmonary disease, unspecified: Secondary | ICD-10-CM | POA: Insufficient documentation

## 2024-09-08 DIAGNOSIS — Z79899 Other long term (current) drug therapy: Secondary | ICD-10-CM

## 2024-09-08 DIAGNOSIS — Z888 Allergy status to other drugs, medicaments and biological substances status: Secondary | ICD-10-CM

## 2024-09-08 DIAGNOSIS — Z66 Do not resuscitate: Secondary | ICD-10-CM | POA: Diagnosis present

## 2024-09-08 DIAGNOSIS — Z808 Family history of malignant neoplasm of other organs or systems: Secondary | ICD-10-CM

## 2024-09-08 DIAGNOSIS — K219 Gastro-esophageal reflux disease without esophagitis: Secondary | ICD-10-CM | POA: Diagnosis not present

## 2024-09-08 DIAGNOSIS — Z800 Family history of malignant neoplasm of digestive organs: Secondary | ICD-10-CM

## 2024-09-08 DIAGNOSIS — E1169 Type 2 diabetes mellitus with other specified complication: Secondary | ICD-10-CM | POA: Diagnosis present

## 2024-09-08 DIAGNOSIS — R7989 Other specified abnormal findings of blood chemistry: Secondary | ICD-10-CM | POA: Diagnosis present

## 2024-09-08 DIAGNOSIS — R5381 Other malaise: Secondary | ICD-10-CM | POA: Diagnosis present

## 2024-09-08 DIAGNOSIS — J9611 Chronic respiratory failure with hypoxia: Secondary | ICD-10-CM

## 2024-09-08 DIAGNOSIS — K2100 Gastro-esophageal reflux disease with esophagitis, without bleeding: Secondary | ICD-10-CM | POA: Diagnosis not present

## 2024-09-08 DIAGNOSIS — D72828 Other elevated white blood cell count: Secondary | ICD-10-CM | POA: Diagnosis present

## 2024-09-08 DIAGNOSIS — Z7952 Long term (current) use of systemic steroids: Secondary | ICD-10-CM

## 2024-09-08 DIAGNOSIS — Q399 Congenital malformation of esophagus, unspecified: Secondary | ICD-10-CM | POA: Diagnosis not present

## 2024-09-08 DIAGNOSIS — Z87442 Personal history of urinary calculi: Secondary | ICD-10-CM

## 2024-09-08 DIAGNOSIS — Z825 Family history of asthma and other chronic lower respiratory diseases: Secondary | ICD-10-CM

## 2024-09-08 DIAGNOSIS — D509 Iron deficiency anemia, unspecified: Secondary | ICD-10-CM | POA: Diagnosis not present

## 2024-09-08 DIAGNOSIS — Z87891 Personal history of nicotine dependence: Secondary | ICD-10-CM | POA: Diagnosis not present

## 2024-09-08 DIAGNOSIS — F32A Depression, unspecified: Secondary | ICD-10-CM | POA: Diagnosis present

## 2024-09-08 DIAGNOSIS — Z9049 Acquired absence of other specified parts of digestive tract: Secondary | ICD-10-CM

## 2024-09-08 DIAGNOSIS — E1140 Type 2 diabetes mellitus with diabetic neuropathy, unspecified: Secondary | ICD-10-CM | POA: Diagnosis present

## 2024-09-08 DIAGNOSIS — Z7982 Long term (current) use of aspirin: Secondary | ICD-10-CM

## 2024-09-08 DIAGNOSIS — D649 Anemia, unspecified: Secondary | ICD-10-CM | POA: Diagnosis not present

## 2024-09-08 DIAGNOSIS — I69351 Hemiplegia and hemiparesis following cerebral infarction affecting right dominant side: Secondary | ICD-10-CM

## 2024-09-08 DIAGNOSIS — Z9981 Dependence on supplemental oxygen: Secondary | ICD-10-CM

## 2024-09-08 DIAGNOSIS — D500 Iron deficiency anemia secondary to blood loss (chronic): Secondary | ICD-10-CM

## 2024-09-08 LAB — COMPREHENSIVE METABOLIC PANEL WITH GFR
ALT: 9 U/L (ref 3–35)
ALT: 7 U/L (ref 0–44)
AST: 12 U/L (ref 5–37)
AST: 12 U/L — ABNORMAL LOW (ref 15–41)
Albumin: 3.8 g/dL (ref 3.5–5.2)
Albumin: 3.8 g/dL (ref 3.5–5.0)
Alkaline Phosphatase: 46 U/L (ref 39–117)
Alkaline Phosphatase: 55 U/L (ref 38–126)
Anion gap: 9 (ref 5–15)
BUN: 18 mg/dL (ref 6–23)
BUN: 18 mg/dL (ref 8–23)
CO2: 32 meq/L (ref 19–32)
CO2: 29 mmol/L (ref 22–32)
Calcium: 9.2 mg/dL (ref 8.4–10.5)
Calcium: 9.5 mg/dL (ref 8.9–10.3)
Chloride: 105 meq/L (ref 96–112)
Chloride: 106 mmol/L (ref 98–111)
Creatinine, Ser: 0.87 mg/dL (ref 0.40–1.20)
Creatinine, Ser: 0.86 mg/dL (ref 0.44–1.00)
GFR, Estimated: >60 mL/min
GFR: 62.12 mL/min
Glucose, Bld: 184 mg/dL — ABNORMAL HIGH (ref 70–99)
Glucose, Bld: 127 mg/dL — ABNORMAL HIGH (ref 70–99)
Potassium: 4.3 meq/L (ref 3.5–5.1)
Potassium: 4.1 mmol/L (ref 3.5–5.1)
Sodium: 142 meq/L (ref 135–145)
Sodium: 144 mmol/L (ref 135–145)
Total Bilirubin: 0.2 mg/dL (ref 0.2–1.2)
Total Bilirubin: <0.2 mg/dL (ref 0.0–1.2)
Total Protein: 6.1 g/dL (ref 6.0–8.3)
Total Protein: 5.8 g/dL — ABNORMAL LOW (ref 6.5–8.1)

## 2024-09-08 LAB — RETICULOCYTES
Immature Retic Fract: 21.1 % — ABNORMAL HIGH (ref 2.3–15.9)
RBC.: 2.52 MIL/uL — ABNORMAL LOW (ref 3.87–5.11)
Retic Count, Absolute: 46.6 K/uL (ref 19.0–186.0)
Retic Ct Pct: 1.9 % (ref 0.4–3.1)

## 2024-09-08 LAB — CBC WITH DIFFERENTIAL/PLATELET
Abs Immature Granulocytes: 0.06 K/uL (ref 0.00–0.07)
Basophils Absolute: 0 K/uL (ref 0.0–0.1)
Basophils Relative: 0 %
Eosinophils Absolute: 0 K/uL (ref 0.0–0.5)
Eosinophils Relative: 0 %
HCT: 17.6 % — ABNORMAL LOW (ref 36.0–46.0)
Hemoglobin: 4.7 g/dL — CL (ref 12.0–15.0)
Immature Granulocytes: 1 %
Lymphocytes Relative: 5 %
Lymphs Abs: 0.6 K/uL — ABNORMAL LOW (ref 0.7–4.0)
MCH: 19 pg — ABNORMAL LOW (ref 26.0–34.0)
MCHC: 26.7 g/dL — ABNORMAL LOW (ref 30.0–36.0)
MCV: 71 fL — ABNORMAL LOW (ref 80.0–100.0)
Monocytes Absolute: 0.7 K/uL (ref 0.1–1.0)
Monocytes Relative: 6 %
Neutro Abs: 10.3 K/uL — ABNORMAL HIGH (ref 1.7–7.7)
Neutrophils Relative %: 88 %
Platelets: 346 K/uL (ref 150–400)
RBC: 2.48 MIL/uL — ABNORMAL LOW (ref 3.87–5.11)
RDW: 19 % — ABNORMAL HIGH (ref 11.5–15.5)
WBC: 11.7 K/uL — ABNORMAL HIGH (ref 4.0–10.5)
nRBC: 0 % (ref 0.0–0.2)

## 2024-09-08 LAB — PRO BRAIN NATRIURETIC PEPTIDE: Pro Brain Natriuretic Peptide: 486 pg/mL — ABNORMAL HIGH

## 2024-09-08 LAB — CBC
HCT: 19.3 % — CL (ref 36.0–46.0)
Hemoglobin: 5.3 g/dL — CL (ref 12.0–15.0)
MCHC: 27.3 g/dL — ABNORMAL LOW (ref 30.0–36.0)
MCV: 67.1 fl — ABNORMAL LOW (ref 78.0–100.0)
Platelets: 328 K/uL (ref 150.0–400.0)
RBC: 2.87 Mil/uL — ABNORMAL LOW (ref 3.87–5.11)
RDW: 20.3 % — ABNORMAL HIGH (ref 11.5–15.5)
WBC: 11.6 K/uL — ABNORMAL HIGH (ref 4.0–10.5)

## 2024-09-08 LAB — LIPID PANEL
Cholesterol: 134 mg/dL (ref 28–200)
HDL: 75.3 mg/dL
LDL Cholesterol: 48 mg/dL (ref 10–99)
NonHDL: 58.29
Total CHOL/HDL Ratio: 2
Triglycerides: 49 mg/dL (ref 10.0–149.0)
VLDL: 9.8 mg/dL (ref 0.0–40.0)

## 2024-09-08 LAB — PROTIME-INR
INR: 1 (ref 0.8–1.2)
Prothrombin Time: 13.5 s (ref 11.4–15.2)

## 2024-09-08 LAB — FERRITIN: Ferritin: 8 ng/mL — ABNORMAL LOW (ref 11–307)

## 2024-09-08 LAB — OCCULT BLOOD X 1 CARD TO LAB, STOOL: Fecal Occult Bld: NEGATIVE

## 2024-09-08 MED ORDER — SODIUM CHLORIDE 0.9% IV SOLUTION: Freq: Once | INTRAVENOUS | Status: DC

## 2024-09-08 MED ORDER — PANTOPRAZOLE SODIUM 40 MG IV SOLR
40.0000 mg | INTRAVENOUS | Status: DC
  Administered 2024-09-08 – 2024-09-09 (×2): 40 mg via INTRAVENOUS
  Filled 2024-09-08 (×2): qty 10

## 2024-09-08 NOTE — Progress Notes (Unsigned)
" ° °  Subjective:   Patient ID: Sherry Lambert, female    DOB: [birth year], 84 y.o.   MRN: [id number]  The patient is here for physical. Pertinent topics discussed: Discussed the use of AI scribe software for clinical note transcription with the patient, who gave verbal consent to proceed.  History of Present Illness Sherry Lambert is an 84 year old female with chronic respiratory issues who presents with new skin lesions and worsening breathing difficulties.  She has developed skin lesions described as 'bruisey kind of looking stuff' that have progressed into small sores. These lesions are more severe than previous occurrences, are not painful, and tend to come and go without completely resolving. New lesions appear in different locations.  She experiences daily swelling in her feet, with fluid accumulation and colder toes compared to the rest of her foot. She has noticed 'purpley looking' toes.  She has chronic respiratory issues and uses oxygen  therapy. Her breathing has worsened, particularly with exertion, such as getting up from a seated position. She uses nebulizer treatments a couple of times a day and has received her flu shot this fall. She can breathe while sitting but becomes out of breath with activity. No new chest pain, heart racing, or pressure in her chest. No new gastrointestinal symptoms such as diarrhea, constipation, or pain.  Regarding her sleep, she can fall asleep but often wakes up around 3 or 4 AM and cannot return to sleep. She uses a sleeping pill occasionally, which helps her sleep through the night.  Her weight is stable, and she is able to eat without difficulty, although her breathing issues can sometimes make eating challenging.  PMH, [facility], social history reviewed and updated  Review of Systems  Constitutional:  Positive for activity change.  HENT: Negative.    Eyes: Negative.   Respiratory:  Positive for shortness of breath. Negative for cough and chest  tightness.   Cardiovascular:  Negative for chest pain, palpitations and leg swelling.  Gastrointestinal:  Negative for abdominal distention, abdominal pain, constipation, diarrhea, nausea and vomiting.  Musculoskeletal: Negative.   Skin: Negative.   Neurological: Negative.   Psychiatric/Behavioral: Negative.      Objective:  Physical Exam Constitutional:      Appearance: She is well-developed.  HENT:     Head: Normocephalic and atraumatic.  Cardiovascular:     Rate and Rhythm: Normal rate and regular rhythm.  Pulmonary:     Effort: Pulmonary effort is normal. No respiratory distress.     Breath sounds: Rhonchi present. No wheezing or rales.     Comments: Chronic oxygen , stable lung exam Abdominal:     General: Bowel sounds are normal. There is no distension.     Palpations: Abdomen is soft.     Tenderness: There is no abdominal tenderness.  Musculoskeletal:     Cervical back: Normal range of motion.  Skin:    General: Skin is warm and dry.  Neurological:     Mental Status: She is alert and oriented to person, place, and time.     Coordination: Coordination abnormal.     Vitals:   09/08/24 1508  BP: 130/60  Pulse: 98  Temp: 98.1 F (36.7 C)  TempSrc: Oral  SpO2: 99%  Weight: 105 lb (47.6 kg)  Height: 4' 11 (1.499 m)    Assessment & Plan:    "

## 2024-09-08 NOTE — Plan of Care (Signed)
 [facility] emergency department to [facility] progressive unit transfer  84 year old female history of COPD, CVA 2016, GERD, hyperlipidemia and chronic dyspnea presented to emergency department complaining of shortness of breath and at PCP office lab work showed low hemoglobin 5.3 and patient was advised to come to emergency department for further evaluation.   Hemodynamically stable. Lab, FOBT negative. CBC showed low hemoglobin 4.7, elevated WBC count 11.7 and normal platelet count. Normal pro time INR. CMP unremarkable.  Elevated proBNP 486.  Chest x-ray no acute disease process emphysema.  Moderate hiatal hernia.  Per EDP rectal exam no evidence of active GI bleed however FOBT came back negative.  Currently patient is on aspirin  for history of CVA.   Hospitalist consulted for further evaluation management of acute development of anemia. Transfusing 2 unit of blood.  Initially discussed with EDP transferred to ED to ED to platelet blood transfusion however found out that there is a progressive bed available at [facility] and patient will be transferred to [facility] directly to the progressive unit in order to start the blood transfusion. Given patient has history of aspirin  use starting IV Protonix  even though FOBT came back negative.  Will recheck FOBT again x 2.   [facility] will assume care on arrival to accepting facility. Until arrival, care as per EDP. However, [facility] available 24/7 for questions and assistance. Check [email or web address] for on-call coverage. Nursing staff, please call [facility] Admits & Consults System-Wide number under Amion on patient's arrival so appropriate admitting provider can evaluate the pt.   Author: Vyctoria Dickman, MD  [facility]

## 2024-09-08 NOTE — Telephone Encounter (Signed)
 FYI Only or Action Required?: FYI only for provider: ED advised.  Patient was last seen in primary care on 09/08/2024 by Rollene Almarie LABOR, MD.  Called Nurse Triage reporting Results (/) and [name] (/).  Symptoms began several months ago.  Interventions attempted: Nothing.  Symptoms are: gradually worsening.  Triage Disposition: Go to ED Now (Notify PCP)  Patient/caregiver understands and will follow disposition?: Yes     Spoke with pts son Vaughan (on [place]). Pt was seen by PCP today in office for DM2. Had labs drawn, told to go to ED for critically low Hct and hgb. Hct 19.3 and hgb 5.3. Son calling to know if this is necessary.   Has hx of COPD with chronic SOB, has gradually gotten worse over the past few months. Severe SOB with exertion. No SOB at rest. No CP or confusion or looking pale or bleeding. Reinforced need to go to ED for increased SOB and severely low hgb/hct. Explained risks of delaying treatment. Agreeable to go, son will drive her to the ED.       Copied from CRM [id number]. Topic: Clinical - Red Word Triage >> Sep 08, 2024  5:30 PM Rozanna MATSU wrote: Red Word that prompted transfer to Nurse Triage: pt son Vaughan [phone] stated she went to her PCP today and they advised her to go to the ER because her oxygen  level was low and stated Dr. Kassie advised them to call in if this happens again Reason for Disposition  [1] MODERATE difficulty breathing (e.g., speaks in phrases, SOB even at rest, pulse 100-120) AND [2] NEW-onset or WORSE than normal  Answer Assessment - Initial Assessment Questions 1. RESPIRATORY STATUS: Describe your breathing? (e.g., wheezing, shortness of breath, unable to speak, severe coughing)      Severe SOB with exertion, none at rest  2. ONSET: When did this breathing problem begin?      Has hx of COPD, has been getting worse over the past several months  3. PATTERN Does the difficult breathing come and go, or has it been  constant since it started?      With exertion, has been getting worse  4. SEVERITY: How bad is your breathing? (e.g., mild, moderate, severe)      Severe with exertion. None at rest.  5. RECURRENT SYMPTOM: Have you had difficulty breathing before? If Yes, ask: When was the last time? and What happened that time?      *No Answer*  6. CARDIAC HISTORY: Do you have any history of heart disease? (e.g., heart attack, angina, bypass surgery, angioplasty)      PAD, htn, venous insufficiency  7. LUNG HISTORY: Do you have any history of lung disease?  (e.g., pulmonary embolus, asthma, emphysema)     COPD  8. CAUSE: What do you think is causing the breathing problem?      COPD  9. OTHER SYMPTOMS: Do you have any other symptoms? (e.g., chest pain, cough, dizziness, fever, runny nose)     *No Answer*  10. O2 SATURATION MONITOR:  Do you use an oxygen  saturation monitor (pulse oximeter) at home? If Yes, ask: What is your reading (oxygen  level) today? What is your usual oxygen  saturation reading? (e.g., 95%)       99% in office today  Protocols used: Breathing Difficulty-A-AH

## 2024-09-08 NOTE — ED Triage Notes (Signed)
 Pt has been sob and had blood drawn at PCP and HCT 19.3 and HBG 5.3. Pt was told to come to ED.

## 2024-09-08 NOTE — Telephone Encounter (Signed)
 Spoke with patient, aware of ugency to go to the ED. [place] would call son to take her to hospital. No additional questions at this time

## 2024-09-08 NOTE — Telephone Encounter (Signed)
 FYI Only or Action Required?: FYI only for provider: Advised ED on earlier call with pts son who was agreeable to take pt, DIL declining ED .  Patient was last seen in primary care on 09/08/2024 by Rollene Almarie LABOR, MD.  Called Nurse Triage reporting Results (/) and [name] (/).  Symptoms began No triage.  Interventions attempted: Other: No triage.  Symptoms are: No triage.  Triage Disposition: Go to ED Now (Notify PCP)  Patient/caregiver understands and will follow disposition?: Yes     Spoke with daughter in law Katesha Eichel ([place]) who called in. Questions about if pt needs to go to ED based on recent critical lab results: hct 19.3 and hgb 5.3. This RN just spoke with the pts son Vaughan a few minutes who had called in prior for full triage, advised to go to the ED. Pt has hx of COPD but has been having increased SOB over the past few months. DIL upset stating that pt can't afford to get sick by going to the ED where she could be exposed to the cold or the flu. Explained need to go to ED for symptoms and current lab results. Declines stating that the results should have been communicated to the pts pulmonologist and advised on what to do despite the labs being ordered by PCP. Reinforced that pt needs to be seen in ED for critically low levels and possible risks of delaying care. DIL states she will speak with her husband Vaughan.      Copied from CRM [id number]. Topic: Clinical - Lab/Test Results >> Sep 08, 2024  5:43 PM Charolett L wrote: Reason for CRM: Patient daughter called and stated that her mother received a call from a nurse telling her to go to the ED for a transfusion due to a critical LAB result. She trying to find out if she can be seen by a pulmonary doctor or does she have to go to ED CB# [phone]

## 2024-09-08 NOTE — Telephone Encounter (Signed)
 CRITICAL VALUE STICKER  CRITICAL VALUE: HCT 19.3 and HBG 5.3  RECEIVER (on-site recipient of call): Luke PRO (representative from lab): Harlene  MD NOTIFIED: Alvia  Calling pt to send to ED.

## 2024-09-08 NOTE — ED Notes (Signed)
 Per Norleen PA, plan to transfuse once transferred to [facility]

## 2024-09-09 ENCOUNTER — Encounter: Payer: Self-pay | Admitting: Internal Medicine

## 2024-09-09 ENCOUNTER — Ambulatory Visit: Payer: Self-pay | Admitting: Internal Medicine

## 2024-09-09 ENCOUNTER — Inpatient Hospital Stay (HOSPITAL_COMMUNITY)

## 2024-09-09 ENCOUNTER — Other Ambulatory Visit

## 2024-09-09 DIAGNOSIS — D509 Iron deficiency anemia, unspecified: Secondary | ICD-10-CM | POA: Diagnosis not present

## 2024-09-09 DIAGNOSIS — K222 Esophageal obstruction: Secondary | ICD-10-CM | POA: Diagnosis not present

## 2024-09-09 DIAGNOSIS — Z85038 Personal history of other malignant neoplasm of large intestine: Secondary | ICD-10-CM

## 2024-09-09 DIAGNOSIS — K449 Diaphragmatic hernia without obstruction or gangrene: Secondary | ICD-10-CM | POA: Diagnosis not present

## 2024-09-09 DIAGNOSIS — E118 Type 2 diabetes mellitus with unspecified complications: Secondary | ICD-10-CM

## 2024-09-09 DIAGNOSIS — K2950 Unspecified chronic gastritis without bleeding: Secondary | ICD-10-CM | POA: Diagnosis not present

## 2024-09-09 DIAGNOSIS — K2100 Gastro-esophageal reflux disease with esophagitis, without bleeding: Secondary | ICD-10-CM | POA: Diagnosis not present

## 2024-09-09 DIAGNOSIS — D649 Anemia, unspecified: Secondary | ICD-10-CM | POA: Diagnosis not present

## 2024-09-09 DIAGNOSIS — R0609 Other forms of dyspnea: Secondary | ICD-10-CM

## 2024-09-09 DIAGNOSIS — K219 Gastro-esophageal reflux disease without esophagitis: Secondary | ICD-10-CM | POA: Diagnosis not present

## 2024-09-09 DIAGNOSIS — Q399 Congenital malformation of esophagus, unspecified: Secondary | ICD-10-CM | POA: Diagnosis not present

## 2024-09-09 DIAGNOSIS — Q394 Esophageal web: Secondary | ICD-10-CM | POA: Diagnosis not present

## 2024-09-09 DIAGNOSIS — Z8673 Personal history of transient ischemic attack (TIA), and cerebral infarction without residual deficits: Secondary | ICD-10-CM

## 2024-09-09 DIAGNOSIS — D500 Iron deficiency anemia secondary to blood loss (chronic): Secondary | ICD-10-CM

## 2024-09-09 LAB — CBC
HCT: 17.9 % — ABNORMAL LOW (ref 36.0–46.0)
Hemoglobin: 4.7 g/dL — CL (ref 12.0–15.0)
MCH: 19 pg — ABNORMAL LOW (ref 26.0–34.0)
MCHC: 26.3 g/dL — ABNORMAL LOW (ref 30.0–36.0)
MCV: 72.2 fL — ABNORMAL LOW (ref 80.0–100.0)
Platelets: 331 K/uL (ref 150–400)
RBC: 2.48 MIL/uL — ABNORMAL LOW (ref 3.87–5.11)
RDW: 19.3 % — ABNORMAL HIGH (ref 11.5–15.5)
WBC: 10.4 K/uL (ref 4.0–10.5)
nRBC: 0 % (ref 0.0–0.2)

## 2024-09-09 LAB — ECHOCARDIOGRAM COMPLETE
Area-P 1/2: 4.6 cm2
Height: 59 in
S' Lateral: 2.7 cm
Weight: 1680 [oz_av]

## 2024-09-09 LAB — PREPARE RBC (CROSSMATCH)

## 2024-09-09 LAB — IRON AND TIBC
Iron: <10 ug/dL — ABNORMAL LOW (ref 28–170)
TIBC: 351 ug/dL (ref 250–450)

## 2024-09-09 LAB — HEMOGLOBIN AND HEMATOCRIT, BLOOD
HCT: 29.8 % — ABNORMAL LOW (ref 36.0–46.0)
Hemoglobin: 9.4 g/dL — ABNORMAL LOW (ref 12.0–15.0)

## 2024-09-09 LAB — ABO/RH: ABO/RH(D): O POS

## 2024-09-09 LAB — HEMOGLOBIN A1C
Hgb A1c MFr Bld: 5.8 % — ABNORMAL HIGH (ref 4.8–5.6)
Mean Plasma Glucose: 119.76 mg/dL

## 2024-09-09 LAB — FOLATE: Folate: 16.1 ng/mL

## 2024-09-09 LAB — VITAMIN B12: Vitamin B-12: 195 pg/mL (ref 180–914)

## 2024-09-09 MED ORDER — IPRATROPIUM-ALBUTEROL 0.5-2.5 (3) MG/3ML IN SOLN
3.0000 mL | RESPIRATORY_TRACT | Status: DC
  Administered 2024-09-09: 3 mL via RESPIRATORY_TRACT

## 2024-09-09 MED ORDER — NA SULFATE-K SULFATE-MG SULF 17.5-3.13-1.6 GM/177ML PO SOLN
0.5000 | Freq: Once | ORAL | Status: DC
  Filled 2024-09-09: qty 1

## 2024-09-09 MED ORDER — PRAVASTATIN SODIUM 10 MG PO TABS
20.0000 mg | ORAL_TABLET | Freq: Every day | ORAL | Status: DC
  Administered 2024-09-09 – 2024-09-11 (×2): 20 mg via ORAL
  Filled 2024-09-09 (×3): qty 2

## 2024-09-09 MED ORDER — BUDESONIDE 0.5 MG/2ML IN SUSP
0.5000 mg | Freq: Two times a day (BID) | RESPIRATORY_TRACT | Status: DC
  Administered 2024-09-09 – 2024-09-11 (×3): 0.5 mg via RESPIRATORY_TRACT
  Filled 2024-09-09 (×5): qty 2

## 2024-09-09 MED ORDER — SODIUM CHLORIDE 0.9 % IV SOLN
200.0000 mg | Freq: Once | INTRAVENOUS | Status: AC
  Administered 2024-09-09: 200 mg via INTRAVENOUS
  Filled 2024-09-09: qty 10

## 2024-09-09 MED ORDER — ACETAMINOPHEN 650 MG RE SUPP: 650.0000 mg | Freq: Four times a day (QID) | RECTAL | Status: DC | PRN

## 2024-09-09 MED ORDER — SODIUM CHLORIDE 0.9 % IV SOLN: INTRAVENOUS | Status: AC

## 2024-09-09 MED ORDER — SIMETHICONE 80 MG PO CHEW
240.0000 mg | CHEWABLE_TABLET | Freq: Once | ORAL | Status: AC
  Administered 2024-09-09: 240 mg via ORAL
  Filled 2024-09-09: qty 3

## 2024-09-09 MED ORDER — GABAPENTIN 300 MG PO CAPS
600.0000 mg | ORAL_CAPSULE | Freq: Two times a day (BID) | ORAL | Status: DC
  Administered 2024-09-09 – 2024-09-11 (×5): 600 mg via ORAL
  Filled 2024-09-09 (×6): qty 2

## 2024-09-09 MED ORDER — IPRATROPIUM-ALBUTEROL 0.5-2.5 (3) MG/3ML IN SOLN
3.0000 mL | Freq: Four times a day (QID) | RESPIRATORY_TRACT | Status: DC | PRN
  Administered 2024-09-09: 3 mL via RESPIRATORY_TRACT
  Filled 2024-09-09: qty 3

## 2024-09-09 MED ORDER — ARFORMOTEROL TARTRATE 15 MCG/2ML IN NEBU
15.0000 ug | INHALATION_SOLUTION | Freq: Two times a day (BID) | RESPIRATORY_TRACT | Status: DC
  Administered 2024-09-09 – 2024-09-11 (×3): 15 ug via RESPIRATORY_TRACT
  Filled 2024-09-09 (×5): qty 2

## 2024-09-09 MED ORDER — SODIUM CHLORIDE 0.9 % IV SOLN: INTRAVENOUS | Status: DC

## 2024-09-09 MED ORDER — MIRABEGRON ER 50 MG PO TB24: 50.0000 mg | ORAL_TABLET | Freq: Every day | ORAL | Status: DC

## 2024-09-09 MED ORDER — ACETAMINOPHEN 325 MG PO TABS
650.0000 mg | ORAL_TABLET | Freq: Four times a day (QID) | ORAL | Status: DC | PRN
  Administered 2024-09-10: 650 mg via ORAL
  Filled 2024-09-09: qty 2

## 2024-09-09 MED ORDER — PREDNISONE 10 MG PO TABS
10.0000 mg | ORAL_TABLET | Freq: Every day | ORAL | Status: DC
  Administered 2024-09-09 – 2024-09-11 (×2): 10 mg via ORAL
  Filled 2024-09-09 (×3): qty 1

## 2024-09-09 MED ORDER — SODIUM CHLORIDE 0.9% IV SOLUTION: Freq: Once | INTRAVENOUS | Status: AC

## 2024-09-09 MED ORDER — IPRATROPIUM-ALBUTEROL 0.5-2.5 (3) MG/3ML IN SOLN
RESPIRATORY_TRACT | Status: AC
  Filled 2024-09-09: qty 3

## 2024-09-09 MED ORDER — NA SULFATE-K SULFATE-MG SULF 17.5-3.13-1.6 GM/177ML PO SOLN: 0.5000 | Freq: Once | ORAL | Status: DC

## 2024-09-09 MED ORDER — ONDANSETRON HCL 4 MG/2ML IJ SOLN: 4.0000 mg | Freq: Four times a day (QID) | INTRAMUSCULAR | Status: DC | PRN

## 2024-09-09 NOTE — Progress Notes (Signed)
 Patient would like to proceed with upper endoscopy tomorrow.  After further conversation she does not want to proceed with colonoscopy.  In the event that a colon neoplasm were to be found on colonoscopy she would not pursue any type of treatment for it.    I changed her from a clear diet to a regular diet and made her n.p.o. after midnight.  I discontinued bowel prep orders

## 2024-09-09 NOTE — Telephone Encounter (Signed)
 Noted, pt to ER last night

## 2024-09-09 NOTE — Assessment & Plan Note (Signed)
 Worsening SOB on exertion lately checking labs CMP and CBC to rule out metabolic cause.

## 2024-09-09 NOTE — Progress Notes (Signed)
 Echocardiogram 2D Echocardiogram has been performed.  Juliene JINNY Rucks 09/09/2024, 9:37 AM

## 2024-09-09 NOTE — Plan of Care (Addendum)
  Problem: Education: Goal: Knowledge of General Education information will improve Description: Including pain rating scale, medication(s)/side effects and non-pharmacologic comfort measures Outcome: Progressing   Problem: Clinical Measurements: Goal: Ability to maintain clinical measurements within normal limits will improve Outcome: Progressing   Problem: Clinical Measurements: Goal: Will remain free from infection Outcome: Progressing   Problem: Clinical Measurements: Goal: Diagnostic test results will improve Outcome: Progressing   Problem: Clinical Measurements: Goal: Respiratory complications will improve Outcome: Progressing   Problem: Clinical Measurements: Goal: Cardiovascular complication will be avoided Outcome: Progressing   Problem: Safety: Goal: Ability to remain free from injury will improve Outcome: Progressing   Problem: Skin Integrity: Goal: Risk for impaired skin integrity will decrease Outcome: Progressing

## 2024-09-09 NOTE — Progress Notes (Signed)
 Pt and son requested purewick placement. MD was inform and okayed purewick placement.

## 2024-09-09 NOTE — Consult Note (Addendum)
 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    Profound iron  deficiency anemia Hemoglobin 4.7, down from 12 a year ago.  No overt bleeding.  Rule out gastrointestinal malignancy, especially colon cancer given personal history and [facility] of colon cancer (sister)  Unfortunately it seems patient did not have surveillance colonoscopy following  colon resection for colon cancer in 2018.  Also, consider slow GI blood loss from Cameron's erosions related to known large hiatal hernia.  Additionally despite any protection that daily PPI offers, she is still at risk for PUD in the setting of chronic aspirin  /prednisone  Hemoglobin has improved with RBC transfusion and iron  infusion. Patient feels okay and is open to proceeding with endoscopic evaluation including upper and lower endoscopy. However, she made it clear that if colon cancer is found she will not proceed with any type of treatment for it.  The risks and benefits of EGD with possible biopsies and colonoscopy with possible biopsies and removal of polyps were discussed with the patient who agrees to proceed. Continue pantoprazole  every 24 hours   GERD History of esophageal stricture status post dilation 2017 Takes Prilosec at home  COPD, on home O2  CVA with residual right upper weakness  Hypertension  See PMH for any additional medical history  / medical problems  Principal Problem:   Symptomatic anemia Active Problems:   Hyperlipidemia associated with type 2 diabetes mellitus (HCC)   COPD (chronic obstructive pulmonary disease) (HCC)   History of CVA (cerebrovascular accident)   GERD  (gastroesophageal reflux disease)   HPI   Brief History:   Patient is an 84 year old female with a history of colon cancer status post right hemicolectomy colectomy October 2018. She also has a history of GERD  /esophageal stricture  /hiatal hernia .  We last saw her in the office in 2019 with complaints of GERD symptoms and dysphagia.  She subsequently underwent EGD with findings of a large hiatal hernia, benign-appearing esophageal stenosis which was dilated .  She was due for surveillance colonoscopy but plan was scheduled at after the dysphagia had been resolved.  This appears not to have been done  Interval history Patient was admitted yesterday after being sent to the ED by her PCP for evaluation of shortness of breath and anemia with outside labs showing hemoglobin in the 5 range.  For comparison, hgb was 12 a year ago.  She is iron  deficient.  She takes a baby aspirin  every day, no other NSAIDs . No overt GI blood loss other than mentioning 1 black stool Christmas before last.  She has no focal GI symptoms other than right upper quadrant discomfort where she had previous resection for colon cancer.  From her description this really sounds more like an awareness of the area other than the pain.  Her appetite is good though she reports losing a few pounds involuntarily.  Admission H&P reviewed and admission labs notable for hemoglobin of 4.7, MCV 71. N no active bleeding on rectal exam and FOBT negative.  Chest x-ray showed emphysema, moderate hiatal hernia.  EKG showed normal sinus rhythm and no acute changes.  Additionally, on admission BUN was normal, liver chemistries were normal.  Ferritin 8, serum iron  less than 10.   Since Admission :    Patient transfused 2 units RBCs . Hgb improved to 9.4.  She also received a dose of IV iron  today.    Recent Labs    09/08/24 1544 09/08/24 2025  PROT 6.1 5.8*  ALBUMIN 3.8 3.8  AST 12 12*  ALT 9 7  ALKPHOS 46 55  BILITOT 0.2 <0.2    Recent Labs    09/08/24 1544 09/08/24 2025 09/09/24 0341  WBC 11.6* 11.7* 10.4  HGB 5.3 Repeated and verified X2.* 4.7* 4.7*  HCT 19.3 Repeated and verified X2.* 17.6* 17.9*  MCV 67.1 Repeated and verified X2.* 71.0* 72.2*  PLT 328.0 346 331   Recent Labs    09/08/24 1544 09/08/24 2025  NA 142 144  K 4.3 4.1  CL 105 106  CO2 32 29  GLUCOSE 184* 127*  BUN 18 18  CREATININE 0.87 0.86  CALCIUM  9.2 9.5    Review of Systems: All systems reviewed and negative except where noted in HPI.  Physical Exam: Vital signs in last 24 hours: Temp:  [97.9 F (36.6 C)-98.5 F (36.9 C)] 98.1 F (36.7 C) (01/07 1206) Pulse Rate:  [77-99] 77 (01/07 1206) Resp:  [14-21] 16 (01/07 1206) BP: (107-150)/(47-77) 130/54 (01/07 1206) SpO2:  [99 %-100 %] 100 % (01/07 1206) Weight:  [47.6 kg] 47.6 kg (01/06 1508)   General:  Pleasant female in NAD Psych:  Cooperative. Normal mood and affect Eyes: Pupils equal Ears:  Normal auditory acuity Nose: No deformity, discharge or lesions Neck:  Supple, no masses felt Lungs: Decreased breath sounds at bilateral bases  Heart:  Regular rate, regular rhythm.  Abdomen:  Soft, nondistended, nontender, active bowel sounds, no masses felt Rectal :  Deferred Msk: Symmetrical without gross deformities.  Neurologic:  Alert, oriented, grossly normal neurologically Extremities : No edema Skin:  Intact without significant lesions.   OUTPATIENT MEDICATIONS Prior to Admission medications  Medication Sig Start Date End Date Taking? Authorizing Provider  acetaminophen  (TYLENOL ) 500 MG tablet Take 500 mg by mouth daily.     [provider]  albuterol  (VENTOLIN  HFA) 108 (90 Base) MCG/ACT inhaler Inhale 1-2 puffs into the lungs every 4 (four) hours as needed for wheezing or shortness of breath. 08/22/23   Kassie Acquanetta Bradley, MD  arformoterol  (BROVANA ) 15 MCG/2ML NEBU Take 2 mLs (15 mcg total) by nebulization 2 (two) times daily. 07/10/24   Kassie Acquanetta Bradley, MD  aspirin  EC 81 MG tablet Take 1 tablet (81 mg total) by mouth daily. 12/20/14   Jerri Pfeiffer, MD  budesonide  (PULMICORT ) 0.5 MG/2ML nebulizer solution Take 2 mLs (0.5 mg total) by nebulization in the morning and at bedtime. 08/22/23   Kassie Acquanetta Bradley, MD  furosemide  (LASIX ) 20 MG tablet Take 1 tablet (20 mg total) by mouth daily as needed. 05/19/24   Kassie Acquanetta Bradley, MD  gabapentin  (NEURONTIN ) 300 MG capsule Take 2 capsules by mouth twice daily 08/13/24   Rollene Almarie LABOR, MD  ipratropium-albuterol  (DUONEB) 0.5-2.5 (3) MG/3ML SOLN USE 1 AMPULE IN NEBULIZER EVERY 6 HOURS AS NEEDED 04/19/24   Kassie Acquanetta Bradley, MD  mirabegron  ER (MYRBETRIQ ) 50  MG TB24 tablet Take 1 tablet (50 mg total) by mouth daily. 02/10/24   Rollene Almarie LABOR, MD  nystatin  (MYCOSTATIN ) 100000 UNIT/ML suspension Take 5 mLs (500,000 Units total) by mouth 4 (four) times daily. 05/23/23   Kassie Acquanetta Bradley, MD  omeprazole  [facility]) 40 MG capsule Take 1 capsule by mouth once daily 07/29/24   Rollene Almarie LABOR, MD  Polyvinyl Alcohol -Povidone (REFRESH OP) Place 1 drop into both eyes daily as needed (For dry eyes).    [provider]  pravastatin  (PRAVACHOL ) 20 MG tablet Take 1 tablet by mouth once daily 06/22/24   Rollene Almarie LABOR, MD  predniSONE  (DELTASONE ) 10 MG tablet Take 1 tablet (10 mg total) by mouth daily with breakfast. 08/11/24   Kassie Acquanetta Bradley, MD  traMADol  (ULTRAM ) 50 MG tablet TAKE 1 TABLET BY MOUTH ONCE DAILY AS NEEDED 08/05/24   Rollene Almarie LABOR, MD  VITAMIN D  PO Take 1 tablet by mouth daily.    [provider]    Allergies as of 09/08/2024 - Review Complete 09/08/2024  Allergen Reaction Noted   Lipitor [atorvastatin ] Itching 12/20/2014    INPATIENT MEDICATIONS Current Facility-Administered Medications  Medication Dose Route Frequency Provider Last Rate Last Admin   0.9 %  sodium chloride  infusion (Manually program via Guardrails IV Fluids)   Intravenous Once Sundil,  Subrina, MD       acetaminophen  (TYLENOL ) tablet 650 mg  650 mg Oral Q6H PRN Alfornia Madison, MD       Or   acetaminophen  (TYLENOL ) suppository 650 mg  650 mg Rectal Q6H PRN Alfornia Madison, MD       arformoterol  (BROVANA ) nebulizer solution 15 mcg  15 mcg Nebulization BID Sundil, Subrina, MD   15 mcg at 09/09/24 0749   budesonide  (PULMICORT ) nebulizer solution 0.5 mg  0.5 mg Nebulization BID Sundil, Subrina, MD   0.5 mg at 09/09/24 9250   gabapentin  (NEURONTIN ) capsule 600 mg  600 mg Oral BID Sundil, Subrina, MD   600 mg at 09/09/24 1023   ipratropium-albuterol  (DUONEB) 0.5-2.5 (3) MG/3ML nebulizer solution 3 mL  3 mL Nebulization Q6H PRN Alfornia Madison, MD       ipratropium-albuterol  (DUONEB) 0.5-2.5 (3) MG/3ML nebulizer solution            ondansetron  (ZOFRAN ) injection 4 mg  4 mg Intravenous Q6H PRN Sundil, Subrina, MD       pantoprazole  (PROTONIX ) injection 40 mg  40 mg Intravenous Q24H Sundil, Subrina, MD   40 mg at 09/08/24 2158   pravastatin  (PRAVACHOL ) tablet 20 mg  20 mg Oral Daily Sundil, Subrina, MD   20 mg at 09/09/24 1023   predniSONE  (DELTASONE ) tablet 10 mg  10 mg Oral Q breakfast Sundil, Subrina, MD   10 mg at 09/09/24 1023     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   [name]    Cataract    Chronic kidney disease    kidney stone   Colitis    Colon cancer (HCC) 06/2017   cecum   Diabetes mellitus without complication (HCC)    no meds taken   Emphysema of lung (HCC)    GERD (gastroesophageal reflux disease)    Hyperlipidemia    Hypertension    Peripheral arterial disease    [name]    Stroke [facility])    November 2016- Thanksgiving night    Past Surgical History:  Procedure Laterality Date   ABDOMINAL HYSTERECTOMY  BALLOON DILATION N/A 12/23/2012   Procedure: BALLOON DILATION;  Surgeon: Lamar JONETTA Aho, MD;  Location: [facility];  Service: Endoscopy;  Laterality: N/A;   BALLOON DILATION  N/A 01/15/2013   Procedure: BALLOON DILATION;  Surgeon: Lamar JONETTA Aho, MD;  Location: [facility];  Service: Endoscopy;  Laterality: N/A;   BALLOON DILATION N/A 07/22/2018   Procedure: BALLOON DILATION;  Surgeon: Shila Gustav GAILS, MD;  Location: [facility];  Service: Endoscopy;  Laterality: N/A;   cataract     both eyes   COLONOSCOPY     COLONOSCOPY WITH PROPOFOL  N/A 03/05/2017   Procedure: COLONOSCOPY WITH PROPOFOL ;  Surgeon: Shila Gustav GAILS, MD;  Location: [facility];  Service: Endoscopy;  Laterality: N/A;   DENTAL RESTORATION/EXTRACTION WITH X-RAY     ESOPHAGOGASTRODUODENOSCOPY N/A 12/23/2012   Procedure: ESOPHAGOGASTRODUODENOSCOPY (EGD);  Surgeon: Lamar JONETTA Aho, MD;  Location: [facility];  Service: Endoscopy;  Laterality: N/A;   ESOPHAGOGASTRODUODENOSCOPY N/A 01/15/2013   Procedure: ESOPHAGOGASTRODUODENOSCOPY (EGD);  Surgeon: Lamar JONETTA Aho, MD;  Location: [facility];  Service: Endoscopy;  Laterality: N/A;   ESOPHAGOGASTRODUODENOSCOPY (EGD) WITH PROPOFOL  N/A 07/22/2018   Procedure: ESOPHAGOGASTRODUODENOSCOPY (EGD) WITH PROPOFOL ;  Surgeon: Shila Gustav GAILS, MD;  Location: [facility];  Service: Endoscopy;  Laterality: N/A;   TONSILLECTOMY      Family History  Problem Relation Age of Onset   Hypertension Father    Diabetes Father    Cancer Father        melanoma   Emphysema Mother    Colon cancer Sister 4   Cancer Sister        colon, surgery alone    Emphysema Sister    Early death Neg Hx    Stroke Neg Hx    Esophageal cancer Neg Hx    Rectal cancer Neg Hx    Stomach cancer Neg Hx     Social History   Socioeconomic History   Marital status: Divorced    Spouse name: Not on file   Number of children: 1   Years of education: Not on file   Highest education level: Not on file  Occupational History   Occupation: Retired    Comment: hosier [employer]  Tobacco Use   Smoking status: Former    Current packs/day: 0.00    Average packs/day: 1 pack/day for  55.0 years (55.0 ttl pk-yrs)    Types: Cigarettes    Start date: 10/20/1947    Quit date: 10/19/2002    Years since quitting: 21.9   Smokeless tobacco: Never  Vaping Use   Vaping status: Never Used  Substance and Sexual Activity   Alcohol  use: No    Alcohol /week: 0.0 standard drinks of alcohol    Drug use: No   Sexual activity: Never    Birth control/protection: Surgical  Other Topics Concern   Not on file  Social History Narrative   Regular exercise-no   Caffeine  Use-yes   Social Drivers of Health   Tobacco Use: Medium Risk (09/09/2024)   Patient History    Smoking Tobacco Use: Former    Smokeless Tobacco Use: Never    Passive Exposure: Not on Actuary Strain: Low Risk (08/30/2023)   Overall Financial Resource Strain (CARDIA)    Difficulty of Paying Living Expenses: Not hard at all  Food Insecurity: No Food Insecurity (08/30/2023)   Hunger Vital Sign    Worried About Running Out of Food in the Last Year: Never true    Ran Out of Food in the Last Year: Never  true  Transportation Needs: No Transportation Needs (08/30/2023)   PRAPARE - Administrator, Civil Service (Medical): No    Lack of Transportation (Non-Medical): No  Physical Activity: Insufficiently Active (08/30/2023)   Exercise Vital Sign    Days of Exercise per Week: 7 days    Minutes of Exercise per Session: 10 min  Stress: No Stress Concern Present (08/30/2023)   [employer] of Occupational Health - Occupational Stress Questionnaire    Feeling of Stress : Not at all  Social Connections: Moderately Isolated (08/30/2023)   Social Connection and Isolation Panel    Frequency of Communication with Friends and Family: More than three times a week    Frequency of Social Gatherings with Friends and Family: More than three times a week    Attends Religious Services: More than 4 times per year    Active Member of [employer] or Organizations: No    Attends Banker Meetings: Never     Marital Status: Divorced  Catering Manager Violence: Not At Risk (08/30/2023)   Humiliation, Afraid, Rape, and Kick questionnaire    Fear of Current or Ex-Partner: No    Emotionally Abused: No    Physically Abused: No    Sexually Abused: No  Depression (PHQ2-9): Medium Risk (08/30/2023)   Depression (PHQ2-9)    PHQ-2 Score: 10  Alcohol  Screen: Low Risk (08/30/2023)   Alcohol  Screen    Last Alcohol  Screening Score (AUDIT): 0  Housing: Unknown (08/30/2023)   Housing Stability Vital Sign    Unable to Pay for Housing in the Last Year: No    Number of Times Moved in the Last Year: Not on file    Homeless in the Last Year: No  Utilities: Not At Risk (08/30/2023)   AHC Utilities    Threatened with loss of utilities: No  Health Literacy: Adequate Health Literacy (08/30/2023)   B1300 Health Literacy    Frequency of need for help with medical instructions: Never    Code Status   Code Status: Limited: Do not attempt resuscitation (DNR) -DNR-LIMITED -Do Not Intubate/DNI    Vina Dasen, NP-C   09/09/2024, 12:51 PM  --------------------------------------------------------------------------------  I have taken a history, reviewed the chart and examined the patient. I performed a substantive portion of this encounter, including complete performance of at least one of the key components, in conjunction with the APP. I agree with the APP's note, impression and recommendations  84 year old female with history of stage II colon cancer in 2018, severe COPD on 4 L home O2, history of stroke, admitted with severe iron  deficiency anemia, with hemoglobin of 4 down from baseline of 12.  No overt GI bleeding. Patient has not had a colonoscopy since her colon cancer was surgically resected in 2018.  High degree of suspicion that her iron  deficiency anemia may be from cancer recurrence. Although the patient initially agreed to a colonoscopy, when I came to see the patient she had decided she was not  going to proceed with a colonoscopy.  She states that she absolutely would not want to pursue any sort of treatment for colon cancer to include repeat surgery or chemotherapy, and therefore does not want to do the colonoscopy. She would be willing to undergo an upper endoscopy, in the event that there is a treatable, nonmalignant source of chronic bleeding in her upper GI tract.  I told her that it is certainly not unreasonable at her age to decline interventions and treatments for things such as cancer.  She is aware that there may be benign findings for chronic GI blood loss in her colon such as colonic AVMs that could be successfully treated, but she still would not like to proceed with a colonoscopy at this time.  She may reconsider following the EGD.  Plan EGD tomorrow, currently not planning on colonoscopy   Martino Tompson E. Stacia, MD [facility]  Moderate complex medical decision making (this includes chart review, review of results, face-to-face time used for counseling as well as treatment plan and follow-up. The patient was provided an opportunity to ask questions and all were answered. The patient agreed with the plan and demonstrated an understanding of the instructions    "

## 2024-09-09 NOTE — Progress Notes (Signed)
 Brief same day note:   Patient is a 84 year old female with history of COPD on home oxygen  at 4 L per minute , CVA with residual right upper extremity weakness, esophageal stricture status post dilation, GERD, prediabetes who was brought from home for the evaluation of shortness of breath, anemia as per outpatient lab.  She was complaining of progressive worsening dyspnea on exertion and fatigue.  She is chronically on 4 L of oxygen  at home for COPD.  No history of hematochezia, hematemesis masses or melena.  On presentation, she was hemodynamically stable.  Labs showed WC count of 11.7, hemoglobin 4.7, iron  level less than 10.  No evidence of active GI bleed on rectal exam, FOBT negative.  Started on Protonix , GI consulted.  Patient seen and examined at bedside today.  Comfortably lying in bed.  Hemodynamically stable.  Denies any abdomen pain, nausea or vomiting.  No hematochezia or melena.  Assessment and plan:  Severe symptomatic anemia/iron  deficiency anemia: Hemoglobin of 12 as per lab 1 year ago.  Presented with hemoglobin 4.7.  History of esophageal stenosis status post dilatation, gastritis.  No report of hematemesis, hematochezia or melena.  Takes aspirin  81 mg daily. Iron  level of less than 10.  Being given 2 units of PRBC.  Continue Protonix .  FOBT negative.  GI following.    Leukocytosis: Mild.  Most likely reactive.  Resolved  Mildly  elevated proBNP: Last echo done on 2015 showed EF of 65 to 70%, grade 1 diastolic dysfunction.  Does not appear to be volume overloaded.  Chest x-ray without pulm edema.  Repeat echo ordered  COPD/chronic hypoxic respiratory  failure: On 4 L of oxygen  currently.  Currently at baseline.  On chronic prednisone  therapy.  Continue bronchodilators as needed  History of CVA: Has residual right-sided weakness.  On aspirin  at home.  Currently on hold.  Takes pravastatin .  Will consult PT and OT when appropriate.  She is nonambulatory at baseline.  Has a  caretaker at home.  Hiatal hernia/GERD: Continue PPI  History of prediabetes: A1c of 6 as per December 24  Chronic pain/neuropathy: On Gabapentin 

## 2024-09-09 NOTE — ED Provider Notes (Signed)
 " [facility] Provider Note   CSN: [id number] Arrival date & time: 09/08/24  1930     Patient presents with: [name] and Abnormal Lab  HPI Sherry Lambert is a 84 y.o. female with COPD, type 2 diabetes, hyperlipidemia presenting for shortness of breath and abnormal lab.  Was sent here by her PCP for hemoglobin of 5.3.  She also notes that she has been short of breath for the last couple days and more fatigued in general.  She states she normally has a 3 L oxygen  requirement at home but has increased her oxygen  to 4 L.  She denies chest pain.  Cough or fever.  Denies bloody output.    [name] Abnormal Lab      Prior to Admission medications  Medication Sig Start Date End Date Taking? Authorizing Provider  acetaminophen  (TYLENOL ) 500 MG tablet Take 500 mg by mouth every 6 (six) hours as needed for mild pain (pain score 1-3).   Yes [provider]  albuterol  (VENTOLIN  HFA) 108 (90 Base) MCG/ACT inhaler Inhale 1-2 puffs into the lungs every 4 (four) hours as needed for wheezing or shortness of breath. 08/22/23  Yes Kassie Acquanetta Bradley, MD  arformoterol  (BROVANA ) 15 MCG/2ML NEBU Take 2 mLs (15 mcg total) by nebulization 2 (two) times daily. 07/10/24  Yes Kassie Acquanetta Bradley, MD  aspirin  EC 81 MG tablet Take 1 tablet (81 mg total) by mouth daily. 12/20/14  Yes Jerri Pfeiffer, MD  budesonide  (PULMICORT ) 0.5 MG/2ML nebulizer solution Take 2 mLs (0.5 mg total) by nebulization in the morning and at bedtime. 08/22/23  Yes Kassie Acquanetta Bradley, MD  furosemide  (LASIX ) 20 MG tablet Take 1 tablet (20 mg total) by mouth daily as needed. 05/19/24  Yes Kassie Acquanetta Bradley, MD  gabapentin  (NEURONTIN ) 300 MG capsule Take 2 capsules by mouth twice daily 08/13/24  Yes Rollene Almarie LABOR, MD  ipratropium-albuterol  (DUONEB) 0.5-2.5 (3) MG/3ML SOLN USE 1 AMPULE IN NEBULIZER EVERY 6 HOURS AS NEEDED Patient taking differently: Inhale 3 mLs into the lungs every 6 (six) hours  as needed (for shortness of breath). 04/19/24  Yes Kassie Acquanetta Bradley, MD  mirabegron  ER (MYRBETRIQ ) 50 MG TB24 tablet Take 1 tablet (50 mg total) by mouth daily. 02/10/24  Yes Rollene Almarie LABOR, MD  nystatin  (MYCOSTATIN ) 100000 UNIT/ML suspension Take 5 mLs (500,000 Units total) by mouth 4 (four) times daily. 05/23/23  Yes Kassie Acquanetta Bradley, MD  omeprazole  [facility]) 40 MG capsule Take 1 capsule by mouth once daily 07/29/24  Yes Rollene Almarie LABOR, MD  Polyvinyl Alcohol -Povidone (REFRESH OP) Place 1 drop into both eyes daily as needed (For dry eyes).   Yes [provider]  pravastatin  (PRAVACHOL ) 20 MG tablet Take 1 tablet by mouth once daily 06/22/24  Yes Rollene Almarie LABOR, MD  predniSONE  (DELTASONE ) 10 MG tablet Take 1 tablet (10 mg total) by mouth daily with breakfast. 08/11/24  Yes Kassie Acquanetta Bradley, MD  traMADol  (ULTRAM ) 50 MG tablet TAKE 1 TABLET BY MOUTH ONCE DAILY AS NEEDED Patient taking differently: Take 50 mg by mouth daily as needed for moderate pain (pain score 4-6). 08/05/24  Yes Rollene Almarie LABOR, MD  VITAMIN D  PO Take 1 tablet by mouth daily.   Yes [provider]    Allergies: Lipitor [atorvastatin ]    Review of Systems  Respiratory:  Positive for shortness of breath.     Updated Vital Signs BP 136/86 (BP Location: Left Arm)  Pulse 83   Temp 97.8 F (36.6 C) (Oral)   Resp 17   Ht 4' 11 (1.499 m)   Wt 47.6 kg   SpO2 99%   BMI 21.20 kg/m   Physical Exam Vitals and nursing note reviewed.  HENT:     Head: Normocephalic and atraumatic.     Mouth/Throat:     Mouth: Mucous membranes are moist.  Eyes:     General:        Right eye: No discharge.        Left eye: No discharge.     Conjunctiva/sclera: Conjunctivae normal.  Cardiovascular:     Rate and Rhythm: Normal rate and regular rhythm.     Pulses: Normal pulses.     Heart sounds: Normal heart sounds.  Pulmonary:     Effort: Pulmonary effort is normal.     Breath sounds: Normal  breath sounds and air entry.     Comments: On 4L [place] Abdominal:     General: Abdomen is flat.     Palpations: Abdomen is soft.  Skin:    General: Skin is warm and dry.     Coloration: Skin is pale.  Neurological:     General: No focal deficit present.  Psychiatric:        Mood and Affect: Mood normal.     (all labs ordered are listed, but only abnormal results are displayed) Labs Reviewed  PRO BRAIN NATRIURETIC PEPTIDE - Abnormal; Notable for the following components:      Result Value   Pro Brain Natriuretic Peptide 486.0 (*)    All other components within normal limits  COMPREHENSIVE METABOLIC PANEL WITH GFR - Abnormal; Notable for the following components:   Glucose, Bld 127 (*)    Total Protein 5.8 (*)    AST 12 (*)    All other components within normal limits  CBC WITH DIFFERENTIAL/PLATELET - Abnormal; Notable for the following components:   WBC 11.7 (*)    RBC 2.48 (*)    Hemoglobin 4.7 (*)    HCT 17.6 (*)    MCV 71.0 (*)    MCH 19.0 (*)    MCHC 26.7 (*)    RDW 19.0 (*)    Neutro Abs 10.3 (*)    Lymphs Abs 0.6 (*)    All other components within normal limits  IRON  AND TIBC - Abnormal; Notable for the following components:   Iron  <10 (*)    All other components within normal limits  FERRITIN - Abnormal; Notable for the following components:   Ferritin 8 (*)    All other components within normal limits  RETICULOCYTES - Abnormal; Notable for the following components:   RBC. 2.52 (*)    Immature Retic Fract 21.1 (*)    All other components within normal limits  CBC - Abnormal; Notable for the following components:   RBC 2.48 (*)    Hemoglobin 4.7 (*)    HCT 17.9 (*)    MCV 72.2 (*)    MCH 19.0 (*)    MCHC 26.3 (*)    RDW 19.3 (*)    All other components within normal limits  HEMOGLOBIN A1C - Abnormal; Notable for the following components:   Hgb A1c MFr Bld 5.8 (*)    All other components within normal limits  HEMOGLOBIN AND HEMATOCRIT, BLOOD - Abnormal;  Notable for the following components:   Hemoglobin 9.4 (*)    HCT 29.8 (*)    All other components within normal limits  PROTIME-INR  OCCULT BLOOD X 1 CARD TO LAB, STOOL  VITAMIN B12  FOLATE  CBC  BASIC METABOLIC PANEL WITH GFR  TYPE AND SCREEN  PREPARE RBC (CROSSMATCH)  ABO/RH    EKG: EKG Interpretation Date/Time:  Tuesday September 08 2024 19:54:07 EST Ventricular Rate:  93 PR Interval:  134 QRS Duration:  80 QT Interval:  357 QTC Calculation: 444 R Axis:   56  Text Interpretation: Sinus rhythm Nonspecific T wave abnormality Confirmed by Bernard Drivers ([id number]) on 09/09/2024 3:25:53 PM  Radiology: ECHOCARDIOGRAM COMPLETE Result Date: 09/09/2024    ECHOCARDIOGRAM REPORT   Patient Name:   Sherry Lambert Date of Exam: 09/09/2024 Medical Rec #:  [id number]      Height:       59.0 in Accession #:    [id number]     Weight:       105.0 lb Date of Birth:  08-16-1941      BSA:          1.403 m Patient Age:    83 years       BP:           130/60 mmHg Patient Gender: F              HR:           80 bpm. Exam Location:  Inpatient Procedure: 2D Echo, Cardiac Doppler and Color Doppler (Both Spectral and Color            Flow Doppler were utilized during procedure). Indications:    Dyspnea  History:        Patient has prior history of Echocardiogram examinations, most                 recent 07/30/2014. COPD and Stroke, Signs/Symptoms:Dyspnea and                 [name]; Risk Factors:Dyslipidemia, Diabetes,                 Hypertension, Former Smoker and Sleep Apnea.  Sonographer:    Juliene Rucks Referring Phys: [id number] RATHORE  Sonographer Comments: Image acquisition challenging due to COPD. IMPRESSIONS  1. Left ventricular ejection fraction, by estimation, is 55 to 60%. The left ventricle has normal function. The left ventricle has no regional wall motion abnormalities. Left ventricular diastolic parameters were normal.  2. Right ventricular systolic function is normal. The right  ventricular size is normal.  3. The mitral valve is normal in structure. Mild mitral valve regurgitation. No evidence of mitral stenosis.  4. The aortic valve is tricuspid. Aortic valve regurgitation is not visualized. Aortic valve sclerosis is present, with no evidence of aortic valve stenosis.  5. The inferior vena cava is normal in size with greater than 50% respiratory variability, suggesting right atrial pressure of 3 mmHg. FINDINGS  Left Ventricle: Left ventricular ejection fraction, by estimation, is 55 to 60%. The left ventricle has normal function. The left ventricle has no regional wall motion abnormalities. The left ventricular internal cavity size was normal in size. There is  no left ventricular hypertrophy. Left ventricular diastolic parameters were normal. Right Ventricle: The right ventricular size is normal. No increase in right ventricular wall thickness. Right ventricular systolic function is normal. Left Atrium: Left atrial size was normal in size. Right Atrium: Right atrial size was normal in size. Pericardium: There is no evidence of pericardial effusion. Mitral Valve: The mitral valve is normal in structure. Mild mitral valve regurgitation. No  evidence of mitral valve stenosis. Tricuspid Valve: The tricuspid valve is normal in structure. Tricuspid valve regurgitation is trivial. No evidence of tricuspid stenosis. Aortic Valve: The aortic valve is tricuspid. Aortic valve regurgitation is not visualized. Aortic valve sclerosis is present, with no evidence of aortic valve stenosis. Pulmonic Valve: The pulmonic valve was normal in structure. Pulmonic valve regurgitation is not visualized. No evidence of pulmonic stenosis. Aorta: The aortic root is normal in size and structure. Venous: The inferior vena cava is normal in size with greater than 50% respiratory variability, suggesting right atrial pressure of 3 mmHg. IAS/Shunts: No atrial level shunt detected by color flow Doppler.  LEFT VENTRICLE  PLAX 2D LVIDd:         3.70 cm   Diastology LVIDs:         2.70 cm   LV e' medial:    8.27 cm/s LV PW:         1.00 cm   LV E/e' medial:  12.8 LV IVS:        1.10 cm   LV e' lateral:   7.70 cm/s LVOT diam:     1.80 cm   LV E/e' lateral: 13.8 LV SV:         56 LV SV Index:   40 LVOT Area:     2.54 cm  RIGHT VENTRICLE             IVC RV Basal diam:  2.50 cm     IVC diam: 1.30 cm RV Mid diam:    2.30 cm RV S prime:     10.40 cm/s TAPSE (M-mode): 1.5 cm LEFT ATRIUM             Index        RIGHT ATRIUM          Index LA diam:        3.30 cm 2.35 cm/m   RA Area:     8.17 cm LA Vol (A2C):   32.0 ml 22.82 ml/m  RA Volume:   15.40 ml 10.98 ml/m LA Vol (A4C):   20.1 ml 14.33 ml/m LA Biplane Vol: 25.5 ml 18.18 ml/m  AORTIC VALVE LVOT Vmax:   111.00 cm/s LVOT Vmean:  73.700 cm/s LVOT VTI:    0.220 m  AORTA Ao Root diam: 2.70 cm MITRAL VALVE MV Area (PHT): 4.60 cm     SHUNTS MV Decel Time: 165 msec     Systemic VTI:  0.22 m MV E velocity: 106.00 cm/s  Systemic Diam: 1.80 cm MV A velocity: 114.00 cm/s MV E/A ratio:  0.93 Morene Brownie Electronically signed by Morene Brownie Signature Date/Time: 09/09/2024/1:36:10 PM    Final    DG Chest Port 1 View Result Date: 09/08/2024 CLINICAL DATA:  Short of breath EXAM: PORTABLE CHEST 1 VIEW COMPARISON:  10/29/2022 FINDINGS: Borderline cardiac enlargement. Moderate hiatal hernia. No focal consolidation or effusion. Aortic atherosclerosis. Chronic right-sided rib fractures. Emphysema. IMPRESSION: No active disease. Emphysema. Moderate hiatal hernia. Electronically Signed   By: Luke Bun M.D.   On: 09/08/2024 20:16     .Critical Care  Performed by: Lang Norleen POUR, PA-C Authorized by: Lang Norleen POUR, PA-C   Critical care provider statement:    Critical care time (minutes):  30   Critical care was necessary to treat or prevent imminent or life-threatening deterioration of the following conditions: Symptomatic anemia.  Hemoglobin 4.7.  Transferred to [facility] for blood  transfusion and further evaluation.   Critical care was  time spent personally by me on the following activities:  Development of treatment plan with patient or surrogate, discussions with consultants, evaluation of patient's response to treatment, examination of patient, ordering and review of laboratory studies, ordering and review of radiographic studies, ordering and performing treatments and interventions, pulse oximetry, re-evaluation of patient's condition and review of old charts    Medications Ordered in the ED  0.9 %  sodium chloride  infusion (Manually program via Guardrails IV Fluids) (has no administration in time range)  pantoprazole  (PROTONIX ) injection 40 mg (40 mg Intravenous Given 09/09/24 2210)  arformoterol  (BROVANA ) nebulizer solution 15 mcg (15 mcg Nebulization Not Given 09/09/24 2005)  budesonide  (PULMICORT ) nebulizer solution 0.5 mg (0.5 mg Nebulization Not Given 09/09/24 2005)  gabapentin  (NEURONTIN ) capsule 600 mg (600 mg Oral Given 09/09/24 2210)  pravastatin  (PRAVACHOL ) tablet 20 mg (20 mg Oral Given 09/09/24 1023)  predniSONE  (DELTASONE ) tablet 10 mg (10 mg Oral Given 09/09/24 1023)  ondansetron  (ZOFRAN ) injection 4 mg (has no administration in time range)  ipratropium-albuterol  (DUONEB) 0.5-2.5 (3) MG/3ML nebulizer solution (has no administration in time range)  ipratropium-albuterol  (DUONEB) 0.5-2.5 (3) MG/3ML nebulizer solution 3 mL (3 mLs Nebulization Given 09/09/24 1641)  acetaminophen  (TYLENOL ) tablet 650 mg (has no administration in time range)    Or  acetaminophen  (TYLENOL ) suppository 650 mg (has no administration in time range)  0.9 %  sodium chloride  infusion ( Intravenous Not Given 09/09/24 1519)  0.9 %  sodium chloride  infusion (Manually program via Guardrails IV Fluids) ( Intravenous New Bag/Given 09/09/24 0607)  iron  sucrose (VENOFER ) 200 mg in sodium chloride  0.9 % 100 mL IVPB (200 mg Intravenous New Bag/Given 09/09/24 1223)  simethicone  (MYLICON) chewable tablet 240 mg  (240 mg Oral Given 09/09/24 1741)    Followed by  simethicone  (MYLICON) chewable tablet 240 mg (240 mg Oral Given 09/09/24 2210)                                    Medical Decision Making Amount and/or Complexity of Data Reviewed Labs: ordered. Radiology: ordered.  Risk Decision regarding hospitalization.   Initial Impression and Ddx 84 year old well-appearing female presenting for shortness of breath and abnormal lab.  Exam notable for pale skin but looked comfortable on 4 L.  Hemoccult negative.  DDx includes GI bleed, iron  deficient anemia or anemia of chronic disease, COPD exacerbation, PE, ACS, other. Patient PMH that increases complexity of ED encounter:   COPD with oxygen  req, type 2 diabetes, hyperlipidemia  Interpretation of Diagnostics - I independent reviewed and interpreted the labs as followed: Hgb 4.7, MCV 71, fecal occult negative  - I independently visualized the following imaging with scope of interpretation limited to determining acute life threatening conditions related to emergency care: CXR, which revealed no acute findings  - I personally reviewed and interpreted EKG which revealed sinus rhythm  Patient Reassessment and Ultimate Disposition/Management Workup suggestive of symptomatic anemia.  Etiology remains unknown at this time.  Patient transferred to: Admitted to hospital service with plans to transfuse on arrival and continue evaluation there.  She remains HD stable on 4 L and is well-appearing.  Patient management required discussion with the following services or consulting groups:  Hospitalist Service  Complexity of Problems Addressed Acute complicated illness or Injury  Additional Data Reviewed and Analyzed Further history obtained from: Past medical history and medications listed in the EMR and Prior ED visit notes  Patient Encounter Risk  Assessment Consideration of hospitalization      Final diagnoses:  Symptomatic anemia    ED Discharge  Orders     None          Lang Norleen POUR, PA-C 09/09/24 2357    Horton, Roxie HERO, DO 09/19/24 1501  "

## 2024-09-09 NOTE — Telephone Encounter (Signed)
 Pt seen in office on 09/08/24.

## 2024-09-09 NOTE — H&P (View-Only) (Signed)
 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    Profound iron  deficiency anemia Hemoglobin 4.7, down from 12 a year ago.  No overt bleeding.  Rule out gastrointestinal malignancy, especially colon cancer given personal history and [facility] of colon cancer (sister)  Unfortunately it seems patient did not have surveillance colonoscopy following  colon resection for colon cancer in 2018.  Also, consider slow GI blood loss from Cameron's erosions related to known large hiatal hernia.  Additionally despite any protection that daily PPI offers, she is still at risk for PUD in the setting of chronic aspirin  /prednisone  Hemoglobin has improved with RBC transfusion and iron  infusion. Patient feels okay and is open to proceeding with endoscopic evaluation including upper and lower endoscopy. However, she made it clear that if colon cancer is found she will not proceed with any type of treatment for it.  The risks and benefits of EGD with possible biopsies and colonoscopy with possible biopsies and removal of polyps were discussed with the patient who agrees to proceed. Continue pantoprazole  every 24 hours   GERD History of esophageal stricture status post dilation 2017 Takes Prilosec at home  COPD, on home O2  CVA with residual right upper weakness  Hypertension  See PMH for any additional medical history  / medical problems  Principal Problem:   Symptomatic anemia Active Problems:   Hyperlipidemia associated with type 2 diabetes mellitus (HCC)   COPD (chronic obstructive pulmonary disease) (HCC)   History of CVA (cerebrovascular accident)   GERD  (gastroesophageal reflux disease)   HPI   Brief History:   Patient is an 84 year old female with a history of colon cancer status post right hemicolectomy colectomy October 2018. She also has a history of GERD  /esophageal stricture  /hiatal hernia .  We last saw her in the office in 2019 with complaints of GERD symptoms and dysphagia.  She subsequently underwent EGD with findings of a large hiatal hernia, benign-appearing esophageal stenosis which was dilated .  She was due for surveillance colonoscopy but plan was scheduled at after the dysphagia had been resolved.  This appears not to have been done  Interval history Patient was admitted yesterday after being sent to the ED by her PCP for evaluation of shortness of breath and anemia with outside labs showing hemoglobin in the 5 range.  For comparison, hgb was 12 a year ago.  She is iron  deficient.  She takes a baby aspirin  every day, no other NSAIDs . No overt GI blood loss other than mentioning 1 black stool Christmas before last.  She has no focal GI symptoms other than right upper quadrant discomfort where she had previous resection for colon cancer.  From her description this really sounds more like an awareness of the area other than the pain.  Her appetite is good though she reports losing a few pounds involuntarily.  Admission H&P reviewed and admission labs notable for hemoglobin of 4.7, MCV 71. N no active bleeding on rectal exam and FOBT negative.  Chest x-ray showed emphysema, moderate hiatal hernia.  EKG showed normal sinus rhythm and no acute changes.  Additionally, on admission BUN was normal, liver chemistries were normal.  Ferritin 8, serum iron  less than 10.   Since Admission :    Patient transfused 2 units RBCs . Hgb improved to 9.4.  She also received a dose of IV iron  today.    Recent Labs    09/08/24 1544 09/08/24 2025  PROT 6.1 5.8*  ALBUMIN 3.8 3.8  AST 12 12*  ALT 9 7  ALKPHOS 46 55  BILITOT 0.2 <0.2    Recent Labs    09/08/24 1544 09/08/24 2025 09/09/24 0341  WBC 11.6* 11.7* 10.4  HGB 5.3 Repeated and verified X2.* 4.7* 4.7*  HCT 19.3 Repeated and verified X2.* 17.6* 17.9*  MCV 67.1 Repeated and verified X2.* 71.0* 72.2*  PLT 328.0 346 331   Recent Labs    09/08/24 1544 09/08/24 2025  NA 142 144  K 4.3 4.1  CL 105 106  CO2 32 29  GLUCOSE 184* 127*  BUN 18 18  CREATININE 0.87 0.86  CALCIUM  9.2 9.5    Review of Systems: All systems reviewed and negative except where noted in HPI.  Physical Exam: Vital signs in last 24 hours: Temp:  [97.9 F (36.6 C)-98.5 F (36.9 C)] 98.1 F (36.7 C) (01/07 1206) Pulse Rate:  [77-99] 77 (01/07 1206) Resp:  [14-21] 16 (01/07 1206) BP: (107-150)/(47-77) 130/54 (01/07 1206) SpO2:  [99 %-100 %] 100 % (01/07 1206) Weight:  [47.6 kg] 47.6 kg (01/06 1508)   General:  Pleasant female in NAD Psych:  Cooperative. Normal mood and affect Eyes: Pupils equal Ears:  Normal auditory acuity Nose: No deformity, discharge or lesions Neck:  Supple, no masses felt Lungs: Decreased breath sounds at bilateral bases  Heart:  Regular rate, regular rhythm.  Abdomen:  Soft, nondistended, nontender, active bowel sounds, no masses felt Rectal :  Deferred Msk: Symmetrical without gross deformities.  Neurologic:  Alert, oriented, grossly normal neurologically Extremities : No edema Skin:  Intact without significant lesions.   OUTPATIENT MEDICATIONS Prior to Admission medications  Medication Sig Start Date End Date Taking? Authorizing Provider  acetaminophen  (TYLENOL ) 500 MG tablet Take 500 mg by mouth daily.     [provider]  albuterol  (VENTOLIN  HFA) 108 (90 Base) MCG/ACT inhaler Inhale 1-2 puffs into the lungs every 4 (four) hours as needed for wheezing or shortness of breath. 08/22/23   Kassie Acquanetta Bradley, MD  arformoterol  (BROVANA ) 15 MCG/2ML NEBU Take 2 mLs (15 mcg total) by nebulization 2 (two) times daily. 07/10/24   Kassie Acquanetta Bradley, MD  aspirin  EC 81 MG tablet Take 1 tablet (81 mg total) by mouth daily. 12/20/14   Jerri Pfeiffer, MD  budesonide  (PULMICORT ) 0.5 MG/2ML nebulizer solution Take 2 mLs (0.5 mg total) by nebulization in the morning and at bedtime. 08/22/23   Kassie Acquanetta Bradley, MD  furosemide  (LASIX ) 20 MG tablet Take 1 tablet (20 mg total) by mouth daily as needed. 05/19/24   Kassie Acquanetta Bradley, MD  gabapentin  (NEURONTIN ) 300 MG capsule Take 2 capsules by mouth twice daily 08/13/24   Rollene Almarie LABOR, MD  ipratropium-albuterol  (DUONEB) 0.5-2.5 (3) MG/3ML SOLN USE 1 AMPULE IN NEBULIZER EVERY 6 HOURS AS NEEDED 04/19/24   Kassie Acquanetta Bradley, MD  mirabegron  ER (MYRBETRIQ ) 50  MG TB24 tablet Take 1 tablet (50 mg total) by mouth daily. 02/10/24   Rollene Almarie LABOR, MD  nystatin  (MYCOSTATIN ) 100000 UNIT/ML suspension Take 5 mLs (500,000 Units total) by mouth 4 (four) times daily. 05/23/23   Kassie Acquanetta Bradley, MD  omeprazole  [facility]) 40 MG capsule Take 1 capsule by mouth once daily 07/29/24   Rollene Almarie LABOR, MD  Polyvinyl Alcohol -Povidone (REFRESH OP) Place 1 drop into both eyes daily as needed (For dry eyes).    [provider]  pravastatin  (PRAVACHOL ) 20 MG tablet Take 1 tablet by mouth once daily 06/22/24   Rollene Almarie LABOR, MD  predniSONE  (DELTASONE ) 10 MG tablet Take 1 tablet (10 mg total) by mouth daily with breakfast. 08/11/24   Kassie Acquanetta Bradley, MD  traMADol  (ULTRAM ) 50 MG tablet TAKE 1 TABLET BY MOUTH ONCE DAILY AS NEEDED 08/05/24   Rollene Almarie LABOR, MD  VITAMIN D  PO Take 1 tablet by mouth daily.    [provider]    Allergies as of 09/08/2024 - Review Complete 09/08/2024  Allergen Reaction Noted   Lipitor [atorvastatin ] Itching 12/20/2014    INPATIENT MEDICATIONS Current Facility-Administered Medications  Medication Dose Route Frequency Provider Last Rate Last Admin   0.9 %  sodium chloride  infusion (Manually program via Guardrails IV Fluids)   Intravenous Once Sundil,  Subrina, MD       acetaminophen  (TYLENOL ) tablet 650 mg  650 mg Oral Q6H PRN Alfornia Madison, MD       Or   acetaminophen  (TYLENOL ) suppository 650 mg  650 mg Rectal Q6H PRN Alfornia Madison, MD       arformoterol  (BROVANA ) nebulizer solution 15 mcg  15 mcg Nebulization BID Sundil, Subrina, MD   15 mcg at 09/09/24 9250   budesonide  (PULMICORT ) nebulizer solution 0.5 mg  0.5 mg Nebulization BID Sundil, Subrina, MD   0.5 mg at 09/09/24 9250   gabapentin  (NEURONTIN ) capsule 600 mg  600 mg Oral BID Sundil, Subrina, MD   600 mg at 09/09/24 1023   ipratropium-albuterol  (DUONEB) 0.5-2.5 (3) MG/3ML nebulizer solution 3 mL  3 mL Nebulization Q6H PRN Alfornia Madison, MD       ipratropium-albuterol  (DUONEB) 0.5-2.5 (3) MG/3ML nebulizer solution            ondansetron  (ZOFRAN ) injection 4 mg  4 mg Intravenous Q6H PRN Sundil, Subrina, MD       pantoprazole  (PROTONIX ) injection 40 mg  40 mg Intravenous Q24H Sundil, Subrina, MD   40 mg at 09/08/24 2158   pravastatin  (PRAVACHOL ) tablet 20 mg  20 mg Oral Daily Sundil, Subrina, MD   20 mg at 09/09/24 1023   predniSONE  (DELTASONE ) tablet 10 mg  10 mg Oral Q breakfast Sundil, Subrina, MD   10 mg at 09/09/24 1023     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   [name]    Cataract    Chronic kidney disease    kidney stone   Colitis    Colon cancer (HCC) 06/2017   cecum   Diabetes mellitus without complication (HCC)    no meds taken   Emphysema of lung (HCC)    GERD (gastroesophageal reflux disease)    Hyperlipidemia    Hypertension    Peripheral arterial disease    [name]    Stroke [facility])    November 2016- Thanksgiving night    Past Surgical History:  Procedure Laterality Date   ABDOMINAL HYSTERECTOMY  BALLOON DILATION N/A 12/23/2012   Procedure: BALLOON DILATION;  Surgeon: Lamar JONETTA Aho, MD;  Location: [facility];  Service: Endoscopy;  Laterality: N/A;   BALLOON DILATION  N/A 01/15/2013   Procedure: BALLOON DILATION;  Surgeon: Lamar JONETTA Aho, MD;  Location: [facility];  Service: Endoscopy;  Laterality: N/A;   BALLOON DILATION N/A 07/22/2018   Procedure: BALLOON DILATION;  Surgeon: Shila Gustav GAILS, MD;  Location: [facility];  Service: Endoscopy;  Laterality: N/A;   cataract     both eyes   COLONOSCOPY     COLONOSCOPY WITH PROPOFOL  N/A 03/05/2017   Procedure: COLONOSCOPY WITH PROPOFOL ;  Surgeon: Shila Gustav GAILS, MD;  Location: [facility];  Service: Endoscopy;  Laterality: N/A;   DENTAL RESTORATION/EXTRACTION WITH X-RAY     ESOPHAGOGASTRODUODENOSCOPY N/A 12/23/2012   Procedure: ESOPHAGOGASTRODUODENOSCOPY (EGD);  Surgeon: Lamar JONETTA Aho, MD;  Location: [facility];  Service: Endoscopy;  Laterality: N/A;   ESOPHAGOGASTRODUODENOSCOPY N/A 01/15/2013   Procedure: ESOPHAGOGASTRODUODENOSCOPY (EGD);  Surgeon: Lamar JONETTA Aho, MD;  Location: [facility];  Service: Endoscopy;  Laterality: N/A;   ESOPHAGOGASTRODUODENOSCOPY (EGD) WITH PROPOFOL  N/A 07/22/2018   Procedure: ESOPHAGOGASTRODUODENOSCOPY (EGD) WITH PROPOFOL ;  Surgeon: Shila Gustav GAILS, MD;  Location: [facility];  Service: Endoscopy;  Laterality: N/A;   TONSILLECTOMY      Family History  Problem Relation Age of Onset   Hypertension Father    Diabetes Father    Cancer Father        melanoma   Emphysema Mother    Colon cancer Sister 84   Cancer Sister        colon, surgery alone    Emphysema Sister    Early death Neg Hx    Stroke Neg Hx    Esophageal cancer Neg Hx    Rectal cancer Neg Hx    Stomach cancer Neg Hx     Social History   Socioeconomic History   Marital status: Divorced    Spouse name: Not on file   Number of children: 1   Years of education: Not on file   Highest education level: Not on file  Occupational History   Occupation: Retired    Comment: hosier [employer]  Tobacco Use   Smoking status: Former    Current packs/day: 0.00    Average packs/day: 1 pack/day for  55.0 years (55.0 ttl pk-yrs)    Types: Cigarettes    Start date: 10/20/1947    Quit date: 10/19/2002    Years since quitting: 21.9   Smokeless tobacco: Never  Vaping Use   Vaping status: Never Used  Substance and Sexual Activity   Alcohol  use: No    Alcohol /week: 0.0 standard drinks of alcohol    Drug use: No   Sexual activity: Never    Birth control/protection: Surgical  Other Topics Concern   Not on file  Social History Narrative   Regular exercise-no   Caffeine  Use-yes   Social Drivers of Health   Tobacco Use: Medium Risk (09/09/2024)   Patient History    Smoking Tobacco Use: Former    Smokeless Tobacco Use: Never    Passive Exposure: Not on Actuary Strain: Low Risk (08/30/2023)   Overall Financial Resource Strain (CARDIA)    Difficulty of Paying Living Expenses: Not hard at all  Food Insecurity: No Food Insecurity (08/30/2023)   Hunger Vital Sign    Worried About Running Out of Food in the Last Year: Never true    Ran Out of Food in the Last Year: Never  true  Transportation Needs: No Transportation Needs (08/30/2023)   PRAPARE - Administrator, Civil Service (Medical): No    Lack of Transportation (Non-Medical): No  Physical Activity: Insufficiently Active (08/30/2023)   Exercise Vital Sign    Days of Exercise per Week: 7 days    Minutes of Exercise per Session: 10 min  Stress: No Stress Concern Present (08/30/2023)   [employer] of Occupational Health - Occupational Stress Questionnaire    Feeling of Stress : Not at all  Social Connections: Moderately Isolated (08/30/2023)   Social Connection and Isolation Panel    Frequency of Communication with Friends and Family: More than three times a week    Frequency of Social Gatherings with Friends and Family: More than three times a week    Attends Religious Services: More than 4 times per year    Active Member of [employer] or Organizations: No    Attends Banker Meetings: Never     Marital Status: Divorced  Catering Manager Violence: Not At Risk (08/30/2023)   Humiliation, Afraid, Rape, and Kick questionnaire    Fear of Current or Ex-Partner: No    Emotionally Abused: No    Physically Abused: No    Sexually Abused: No  Depression (PHQ2-9): Medium Risk (08/30/2023)   Depression (PHQ2-9)    PHQ-2 Score: 10  Alcohol  Screen: Low Risk (08/30/2023)   Alcohol  Screen    Last Alcohol  Screening Score (AUDIT): 0  Housing: Unknown (08/30/2023)   Housing Stability Vital Sign    Unable to Pay for Housing in the Last Year: No    Number of Times Moved in the Last Year: Not on file    Homeless in the Last Year: No  Utilities: Not At Risk (08/30/2023)   AHC Utilities    Threatened with loss of utilities: No  Health Literacy: Adequate Health Literacy (08/30/2023)   B1300 Health Literacy    Frequency of need for help with medical instructions: Never    Code Status   Code Status: Limited: Do not attempt resuscitation (DNR) -DNR-LIMITED -Do Not Intubate/DNI    Vina Dasen, NP-C   09/09/2024, 12:51 PM  --------------------------------------------------------------------------------  I have taken a history, reviewed the chart and examined the patient. I performed a substantive portion of this encounter, including complete performance of at least one of the key components, in conjunction with the APP. I agree with the APP's note, impression and recommendations  84 year old female with history of stage II colon cancer in 2018, severe COPD on 4 L home O2, history of stroke, admitted with severe iron  deficiency anemia, with hemoglobin of 4 down from baseline of 12.  No overt GI bleeding. Patient has not had a colonoscopy since her colon cancer was surgically resected in 2018.  High degree of suspicion that her iron  deficiency anemia may be from cancer recurrence. Although the patient initially agreed to a colonoscopy, when I came to see the patient she had decided she was not  going to proceed with a colonoscopy.  She states that she absolutely would not want to pursue any sort of treatment for colon cancer to include repeat surgery or chemotherapy, and therefore does not want to do the colonoscopy. She would be willing to undergo an upper endoscopy, in the event that there is a treatable, nonmalignant source of chronic bleeding in her upper GI tract.  I told her that it is certainly not unreasonable at her age to decline interventions and treatments for things such as cancer.  She is aware that there may be benign findings for chronic GI blood loss in her colon such as colonic AVMs that could be successfully treated, but she still would not like to proceed with a colonoscopy at this time.  She may reconsider following the EGD.  Plan EGD tomorrow, currently not planning on colonoscopy   Jaysa Kise E. Stacia, MD [facility]  Moderate complex medical decision making (this includes chart review, review of results, face-to-face time used for counseling as well as treatment plan and follow-up. The patient was provided an opportunity to ask questions and all were answered. The patient agreed with the plan and demonstrated an understanding of the instructions    "

## 2024-09-09 NOTE — Assessment & Plan Note (Signed)
 Checking HgA1c, UACR, lipid panel and CMP. Adjust as needed not on meds.

## 2024-09-09 NOTE — H&P (Signed)
 " History and Physical    Sherry Lambert FMW:[id number] DOB: 1941/08/08 DOA: 09/08/2024  PCP: Rollene Almarie LABOR, MD  Patient coming from: Home  Chief Complaint: [name]  HPI: Sherry Lambert is a 84 y.o. 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  Patient is reporting 3-4-week history of progressively worsening dyspnea on exertion and fatigue.  She is chronically on 4 L home oxygen  at night and 4 L during the day with exertion due to history of COPD.  She reports having black stool over a year ago in December 2024 but no further episodes since then.  Also denies hematemesis, hematochezia, or hematuria.  Denies any other obvious major bleeding.  She reports history of a right sided colon mass seen on previous colonoscopy and has had some intermittent discomfort in that area and reports unintentional weight loss of 5 to 6 pounds over the past few months.  Not having any significant abdominal discomfort at this time.  No other complaints.  Denies dizziness or chest pain.  ED Course: Vital signs on arrival: Temperature 98.2 F, pulse 99, respiratory rate 14, blood pressure 150/70, and SpO2 100% on room air.  Labs notable for WBC count 11.7, hemoglobin 4.7 (previously 12.0 in December 2024), MCV 71.0, proBNP 486.  No evidence of active GI bleed on rectal exam and FOBT negative.  Chest x-ray showing emphysema, moderate hiatal hernia, and no active disease.  EKG showing normal sinus rhythm, baseline wander in lateral leads, and no acute ischemic changes.  Patient was given IV Protonix  40 mg.  Review of Systems:  Review of Systems  All other systems reviewed and are negative.   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   [name]    Cataract    Chronic kidney disease    kidney stone   Colitis    Colon cancer (HCC) 06/2017   cecum   Diabetes mellitus without complication (HCC)    no meds taken   Emphysema of lung (HCC)    GERD (gastroesophageal reflux disease)    Hyperlipidemia    Hypertension    Peripheral arterial disease    [name]    Stroke [facility])    November 2016- Thanksgiving night    Past Surgical History:  Procedure Laterality Date   ABDOMINAL HYSTERECTOMY     BALLOON DILATION N/A 12/23/2012   Procedure: BALLOON DILATION;  Surgeon: Lamar JONETTA Aho, MD;  Location: [facility];  Service: Endoscopy;  Laterality: N/A;   BALLOON DILATION N/A 01/15/2013   Procedure: BALLOON DILATION;  Surgeon: Lamar JONETTA Aho, MD;  Location: [facility];  Service: Endoscopy;  Laterality: N/A;   BALLOON DILATION N/A 07/22/2018   Procedure: BALLOON DILATION;  Surgeon: Shila Gustav GAILS, MD;  Location: [facility];  Service: Endoscopy;  Laterality: N/A;   cataract     both eyes   COLONOSCOPY     COLONOSCOPY WITH PROPOFOL  N/A 03/05/2017   Procedure: COLONOSCOPY WITH PROPOFOL ;  Surgeon: Shila Gustav GAILS, MD;  Location: [facility];  Service: Endoscopy;  Laterality: N/A;   DENTAL RESTORATION/EXTRACTION WITH X-RAY     ESOPHAGOGASTRODUODENOSCOPY N/A 12/23/2012   Procedure: ESOPHAGOGASTRODUODENOSCOPY (EGD);  Surgeon: Lamar JONETTA Aho, MD;  Location: [facility];  Service: Endoscopy;  Laterality: N/A;   ESOPHAGOGASTRODUODENOSCOPY N/A 01/15/2013   Procedure: ESOPHAGOGASTRODUODENOSCOPY (EGD);  Surgeon: Lamar JONETTA Aho, MD;  Location: [facility];  Service: Endoscopy;  Laterality: N/A;   ESOPHAGOGASTRODUODENOSCOPY (EGD) WITH PROPOFOL  N/A 07/22/2018   Procedure: ESOPHAGOGASTRODUODENOSCOPY (EGD) WITH PROPOFOL ;  Surgeon: Shila Gustav GAILS, MD;  Location: [facility];  Service: Endoscopy;  Laterality: N/A;   TONSILLECTOMY       reports that she quit  smoking about 21 years ago. Her smoking use included cigarettes. She started smoking about 76 years ago. She has a 55 pack-year smoking history. She has never used smokeless tobacco. She reports that she does not drink alcohol  and does not use drugs.  Allergies[1]  Family History  Problem Relation Age of Onset   Hypertension Father    Diabetes Father    Cancer Father        melanoma   Emphysema Mother    Colon cancer Sister 58   Cancer Sister        colon, surgery alone    Emphysema Sister    Early death Neg Hx    Stroke Neg Hx    Esophageal cancer Neg Hx    Rectal cancer Neg Hx    Stomach cancer Neg Hx     Prior to Admission medications  Medication Sig Start Date End Date Taking? Authorizing Provider  acetaminophen  (TYLENOL ) 500 MG tablet Take 500 mg by mouth daily.     [provider]  albuterol  (VENTOLIN  HFA) 108 (90 Base) MCG/ACT inhaler Inhale 1-2 puffs into the lungs every 4 (four) hours as needed for wheezing or shortness of breath. 08/22/23   Kassie Acquanetta Bradley, MD  arformoterol  (BROVANA ) 15 MCG/2ML NEBU Take 2 mLs (15 mcg total) by nebulization 2 (two) times daily. 07/10/24   Kassie Acquanetta Bradley, MD  aspirin  EC 81 MG tablet Take 1 tablet (81 mg total) by mouth daily. 12/20/14   Jerri Pfeiffer, MD  budesonide  (PULMICORT ) 0.5 MG/2ML nebulizer solution Take 2 mLs (0.5 mg total) by nebulization in the morning and at bedtime. 08/22/23   Kassie Acquanetta Bradley, MD  furosemide  (LASIX ) 20 MG tablet Take 1 tablet (20 mg total) by mouth daily as needed. 05/19/24   Kassie Acquanetta Bradley, MD  gabapentin  (NEURONTIN ) 300 MG capsule Take 2 capsules by mouth twice daily 08/13/24   Rollene Almarie LABOR, MD  ipratropium-albuterol  (DUONEB) 0.5-2.5 (3) MG/3ML SOLN USE 1 AMPULE IN NEBULIZER EVERY 6 HOURS AS NEEDED 04/19/24   Kassie Acquanetta Bradley, MD  mirabegron  ER (MYRBETRIQ ) 50 MG TB24 tablet Take 1 tablet (50 mg total) by mouth daily. 02/10/24   Rollene Almarie LABOR, MD  nystatin  (MYCOSTATIN ) 100000  UNIT/ML suspension Take 5 mLs (500,000 Units total) by mouth 4 (four) times daily. 05/23/23   Kassie Acquanetta Bradley, MD  omeprazole  [facility]) 40 MG capsule Take 1 capsule by mouth once daily 07/29/24   Rollene Almarie LABOR, MD  Polyvinyl Alcohol -Povidone (REFRESH OP) Place 1 drop into both eyes daily as needed (For dry eyes).    [provider]  pravastatin  (PRAVACHOL ) 20 MG tablet Take 1 tablet by mouth once daily 06/22/24   Rollene Almarie LABOR, MD  predniSONE  (DELTASONE ) 10 MG tablet Take 1 tablet (10 mg total) by mouth daily with breakfast. 08/11/24   Kassie Acquanetta Bradley, MD  traMADol  (ULTRAM ) 50 MG tablet TAKE 1 TABLET BY MOUTH ONCE DAILY AS NEEDED 08/05/24   Rollene Almarie LABOR, MD  VITAMIN D  PO Take 1 tablet by mouth daily.  [provider]    Physical Exam: Vitals:   09/08/24 1937 09/08/24 2200 09/08/24 2314  BP: (!) 150/70 (!) 117/56 107/77  Pulse: 99 88 88  Resp: 14 (!) 21 19  Temp: 98.2 F (36.8 C)  97.9 F (36.6 C)  TempSrc:   Oral  SpO2: 100% 100% 100%    Physical Exam Vitals reviewed.  Constitutional:      General: She is not in acute distress. HENT:     Head: Normocephalic and atraumatic.  Eyes:     Extraocular Movements: Extraocular movements intact.  Cardiovascular:     Rate and Rhythm: Normal rate and regular rhythm.     Pulses: Normal pulses.  Pulmonary:     Effort: Pulmonary effort is normal. No respiratory distress.     Breath sounds: Normal breath sounds. No wheezing or rales.  Abdominal:     General: Bowel sounds are normal.     Palpations: Abdomen is soft.     Tenderness: There is no abdominal tenderness. There is no guarding.  Musculoskeletal:     Cervical back: Normal range of motion.     Right lower leg: No edema.     Left lower leg: No edema.  Skin:    General: Skin is warm and dry.  Neurological:     Mental Status: She is alert and oriented to person, place, and time.     Comments: Right upper extremity weakness (chronic per  patient)     Labs on Admission: I have personally reviewed following labs and imaging studies  CBC: Recent Labs  Lab 09/08/24 2025  WBC 11.7*  NEUTROABS 10.3*  HGB 4.7*  HCT 17.6*  MCV 71.0*  PLT 346   Basic Metabolic Panel: Recent Labs  Lab 09/08/24 2025  NA 144  K 4.1  CL 106  CO2 29  GLUCOSE 127*  BUN 18  CREATININE 0.86  CALCIUM  9.5   GFR: Estimated Creatinine Clearance: 34.4 mL/min (by C-G formula based on SCr of 0.86 mg/dL). Liver Function Tests: Recent Labs  Lab 09/08/24 2025  AST 12*  ALT 7  ALKPHOS 55  BILITOT <0.2  PROT 5.8*  ALBUMIN 3.8   No results for input(s): LIPASE, AMYLASE in the last 168 hours. No results for input(s): AMMONIA in the last 168 hours. Coagulation Profile: Recent Labs  Lab 09/08/24 2025  INR 1.0   Cardiac Enzymes: No results for input(s): CKTOTAL, CKMB, CKMBINDEX, TROPONINI in the last 168 hours. BNP (last 3 results) Recent Labs    09/08/24 2025  PROBNP 486.0*   HbA1C: No results for input(s): HGBA1C in the last 72 hours. CBG: No results for input(s): GLUCAP in the last 168 hours. Lipid Profile: No results for input(s): CHOL, HDL, LDLCALC, TRIG, CHOLHDL, LDLDIRECT in the last 72 hours. Thyroid Function Tests: No results for input(s): TSH, T4TOTAL, FREET4, T3FREE, THYROIDAB in the last 72 hours. Anemia Panel: Recent Labs    09/08/24 2157 09/08/24 2210  FERRITIN  --  8*  RETICCTPCT 1.9  --    Urine analysis:    Component Value Date/Time   COLORURINE STRAW (A) 02/05/2022 0628   APPEARANCEUR CLEAR 02/05/2022 0628   LABSPEC 1.009 02/05/2022 0628   PHURINE 7.0 02/05/2022 0628   GLUCOSEU NEGATIVE 02/05/2022 0628   GLUCOSEU NEGATIVE 01/10/2021 1050   HGBUR NEGATIVE 02/05/2022 0628   BILIRUBINUR NEGATIVE 02/05/2022 0628   BILIRUBINUR Neg 08/26/2018 1053   KETONESUR NEGATIVE 02/05/2022 0628   PROTEINUR NEGATIVE 02/05/2022 0628   UROBILINOGEN 0.2 01/10/2021 1050  NITRITE NEGATIVE 02/05/2022 0628   LEUKOCYTESUR MODERATE (A) 02/05/2022 0628    Radiological Exams on Admission: DG Chest Port 1 View Result Date: 09/08/2024 CLINICAL DATA:  Short of breath EXAM: PORTABLE CHEST 1 VIEW COMPARISON:  10/29/2022 FINDINGS: Borderline cardiac enlargement. Moderate hiatal hernia. No focal consolidation or effusion. Aortic atherosclerosis. Chronic right-sided rib fractures. Emphysema. IMPRESSION: No active disease. Emphysema. Moderate hiatal hernia. Electronically Signed   By: Luke Bun M.D.   On: 09/08/2024 20:16    Assessment and Plan  Symptomatic severe anemia Hemoglobin was 12.0 on labs about a year ago and now down to 4.7 with MCV 71.0.  Hemodynamically stable.  EGD done in November 2019 was showing benign esophageal stenosis which was dilated, large hiatal hernia, and evidence of gastritis.  Patient reports having melena over a year ago in December 2024 but no recurrence since then.  Also denies hematemesis and hematochezia.  Denies any other bleeding/hematuria etc.  No evidence of active GI bleed on rectal exam done in the ED and FOBT negative.  She is on aspirin  81 mg daily but not on anticoagulation.  Anemia panel ordered.  Type and screen, 2 units PRBCs were ordered.  Follow-up posttransfusion H&H and continue to transfuse if hemoglobin is less than 7.  Last colonoscopy done in July 2018 was showing a rectal mass but biopsy at that time was showing tubulovillous adenoma and no evidence of malignancy.  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  Patient does report unintentional weight loss and will need to repeat colonoscopy to rule out malignancy.  I have sent a message to Dr. Suzann with [facility] requesting consultation in the morning.  Hold aspirin .  Continue IV Protonix  40 mg daily for now.  Borderline leukocytosis Likely reactive, no infectious  signs or symptoms.  Monitor CBC.  Borderline elevation of proBNP No known history of heart failure.  Last echo done in November 2015 showing EF 65 to 70%, mild LVH, grade 1 diastolic dysfunction, mild right atrial enlargement, and lipomatous hypertrophy of the atrial septum.  Does not appear volume overloaded on exam.  Chest x-ray without pulmonary edema.  Repeat echo ordered.  COPD Chronic hypoxemic respiratory failure on 4 L home oxygen  Stable, no signs of acute exacerbation.  No increasing oxygen  requirement from baseline.  She is on chronic prednisone  10 mg daily.  Since patient is not endorsing any recent melena/hematemesis, low suspicion for active upper GI bleed and will continue prednisone .  Continue home Brovana , Pulmicort , and DuoNeb PRN.  History of CVA with residual right-sided weakness Holding aspirin  as above.  Continue pravastatin .  Hiatal hernia/GERD Continue PPI.  Prediabetes Hemoglobin A1c 6.0 in December 2024, repeat ordered.  Chronic pain/neuropathy Continue gabapentin .  DVT prophylaxis: SCDs Code Status: DNR/DNI (discussed with the patient) Family Communication: No family available at this time. Consults called: [facility] Level of care: Progressive Care Unit Admission status: It is my clinical opinion that admission to INPATIENT is reasonable and necessary because of the expectation that this patient will require hospital care that crosses at least 2 midnights to treat this condition based on the medical complexity of the problems presented.  Given the aforementioned information, the predictability of an adverse outcome is felt to be significant.  Editha Ram MD [facility]  If 7PM-7AM, please contact night-coverage [email or web address]  09/09/2024, 3:40 AM       [1]  Allergies Allergen Reactions  Lipitor [email or web address] ] Itching   "

## 2024-09-09 NOTE — Plan of Care (Signed)
 Low hemoglobin 4.7.  Hemodynamically stable.  Order placed for 2 unit of blood transfusion.   Zoi Devine, MD [facility] 09/09/2024, 1:56 AM

## 2024-09-09 NOTE — Assessment & Plan Note (Signed)
 Checking lipid panel and adjust as needed.

## 2024-09-09 NOTE — Assessment & Plan Note (Signed)
 Flu shot yearly. Pneumonia complete. Shingrix complete. Tetanus due at pharmacy. Colonoscopy up to date. Mammogram aged out, pap smear aged out and dexa prior but no records. Counseled about sun safety and mole surveillance. Counseled about the dangers of distracted driving. Given 10 year screening recommendations.

## 2024-09-09 NOTE — Assessment & Plan Note (Signed)
 Has severe COPD and using nebulizers as prescribed and follows with pulmonary.

## 2024-09-10 ENCOUNTER — Inpatient Hospital Stay (HOSPITAL_COMMUNITY): Admitting: Anesthesiology

## 2024-09-10 ENCOUNTER — Encounter (HOSPITAL_COMMUNITY)
Admission: EM | Disposition: A | Discharge status: Home Health | Payer: Self-pay | Source: Ambulatory Visit | Attending: Internal Medicine

## 2024-09-10 DIAGNOSIS — K2100 Gastro-esophageal reflux disease with esophagitis, without bleeding: Secondary | ICD-10-CM | POA: Diagnosis not present

## 2024-09-10 DIAGNOSIS — K449 Diaphragmatic hernia without obstruction or gangrene: Secondary | ICD-10-CM | POA: Diagnosis not present

## 2024-09-10 DIAGNOSIS — Z87891 Personal history of nicotine dependence: Secondary | ICD-10-CM | POA: Diagnosis not present

## 2024-09-10 DIAGNOSIS — K222 Esophageal obstruction: Secondary | ICD-10-CM

## 2024-09-10 DIAGNOSIS — J449 Chronic obstructive pulmonary disease, unspecified: Secondary | ICD-10-CM | POA: Diagnosis not present

## 2024-09-10 DIAGNOSIS — I10 Essential (primary) hypertension: Secondary | ICD-10-CM

## 2024-09-10 DIAGNOSIS — Q394 Esophageal web: Secondary | ICD-10-CM | POA: Diagnosis not present

## 2024-09-10 DIAGNOSIS — K2950 Unspecified chronic gastritis without bleeding: Secondary | ICD-10-CM

## 2024-09-10 DIAGNOSIS — Q399 Congenital malformation of esophagus, unspecified: Secondary | ICD-10-CM | POA: Diagnosis not present

## 2024-09-10 HISTORY — PX: ESOPHAGOGASTRODUODENOSCOPY: SHX5428

## 2024-09-10 LAB — TYPE AND SCREEN
ABO/RH(D): O POS
Antibody Screen: NEGATIVE
Unit division: 0
Unit division: 0

## 2024-09-10 LAB — BASIC METABOLIC PANEL WITH GFR
Anion gap: 7 (ref 5–15)
BUN: 11 mg/dL (ref 8–23)
CO2: 29 mmol/L (ref 22–32)
Calcium: 9.7 mg/dL (ref 8.9–10.3)
Chloride: 106 mmol/L (ref 98–111)
Creatinine, Ser: 0.85 mg/dL (ref 0.44–1.00)
GFR, Estimated: >60 mL/min
Glucose, Bld: 99 mg/dL (ref 70–99)
Potassium: 4.5 mmol/L (ref 3.5–5.1)
Sodium: 142 mmol/L (ref 135–145)

## 2024-09-10 LAB — BPAM RBC
Blood Product Expiration Date: 202601282359
Blood Product Expiration Date: 202601292359
ISSUE DATE / TIME: 202601070542
ISSUE DATE / TIME: 202601070816
Unit Type and Rh: 5100
Unit Type and Rh: 5100

## 2024-09-10 LAB — CBC
HCT: 33.9 % — ABNORMAL LOW (ref 36.0–46.0)
Hemoglobin: 10.4 g/dL — ABNORMAL LOW (ref 12.0–15.0)
MCH: 24.2 pg — ABNORMAL LOW (ref 26.0–34.0)
MCHC: 30.7 g/dL (ref 30.0–36.0)
MCV: 79 fL — ABNORMAL LOW (ref 80.0–100.0)
Platelets: 267 K/uL (ref 150–400)
RBC: 4.29 MIL/uL (ref 3.87–5.11)
RDW: 20.2 % — ABNORMAL HIGH (ref 11.5–15.5)
WBC: 15.9 K/uL — ABNORMAL HIGH (ref 4.0–10.5)
nRBC: 0.3 % — ABNORMAL HIGH (ref 0.0–0.2)

## 2024-09-10 LAB — HEMOGLOBIN A1C

## 2024-09-10 MED ORDER — ALBUTEROL SULFATE HFA 108 (90 BASE) MCG/ACT IN AERS
INHALATION_SPRAY | RESPIRATORY_TRACT | Status: DC | PRN
  Administered 2024-09-10: 2 via RESPIRATORY_TRACT

## 2024-09-10 MED ORDER — PANTOPRAZOLE SODIUM 40 MG PO TBEC
40.0000 mg | DELAYED_RELEASE_TABLET | Freq: Every day | ORAL | Status: DC
  Administered 2024-09-11: 40 mg via ORAL
  Filled 2024-09-10: qty 1

## 2024-09-10 MED ORDER — PROPOFOL 500 MG/50ML IV EMUL
INTRAVENOUS | Status: DC | PRN
  Administered 2024-09-10: 145 ug/kg/min via INTRAVENOUS

## 2024-09-10 NOTE — Anesthesia Postprocedure Evaluation (Signed)
"   Anesthesia Post Note  Patient: Sherry Lambert  Procedure(s) Performed: EGD (ESOPHAGOGASTRODUODENOSCOPY)     Patient location during evaluation: Endoscopy Anesthesia Type: MAC Level of consciousness: awake and alert Pain management: pain level controlled Vital Signs Assessment: post-procedure vital signs reviewed and stable Respiratory status: spontaneous breathing, nonlabored ventilation, respiratory function stable and patient connected to nasal cannula oxygen  Cardiovascular status: stable and blood pressure returned to baseline Postop Assessment: no apparent nausea or vomiting Anesthetic complications: no   No notable events documented.  Last Vitals:  Vitals:   09/10/24 1100 09/10/24 1110  BP: (!) 76/54 97/80  Pulse: 85 74  Resp: 16 20  Temp:    SpO2: 96% 100%    Last Pain:  Vitals:   09/10/24 1039  TempSrc:   PainSc: Asleep                 Rome Ade      "

## 2024-09-10 NOTE — Plan of Care (Addendum)

## 2024-09-10 NOTE — Transfer of Care (Signed)
 Immediate Anesthesia Transfer of Care Note  Patient: Sherry Lambert  Procedure(s) Performed: EGD (ESOPHAGOGASTRODUODENOSCOPY)  Patient Location: PACU  Anesthesia Type:MAC  Level of Consciousness: drowsy  Airway & Oxygen  Therapy: Patient Spontanous Breathing and Patient connected to face mask oxygen   Post-op Assessment: Report given to RN and Post -op Vital signs reviewed and stable  Post vital signs: Reviewed and stable  Last Vitals:  Vitals Value Taken Time  BP 118/44 09/10/24 10:38  Temp    Pulse 73 09/10/24 10:40  Resp 11 09/10/24 10:40  SpO2 99 % 09/10/24 10:40  Vitals shown include unfiled device data.  Last Pain:  Vitals:   09/10/24 0802  TempSrc: Temporal  PainSc: 0-No pain         Complications: No notable events documented.

## 2024-09-10 NOTE — Anesthesia Preprocedure Evaluation (Signed)
"                                    Anesthesia Evaluation  Patient identified by MRN, date of birth, ID band Patient awake    Reviewed: Allergy & Precautions, NPO status , Patient's Chart, lab work & pertinent test results  Airway Mallampati: I  TM Distance: >3 FB Neck ROM: Full    Dental  (+) Upper Dentures, Partial Lower, Dental Advisory Given   Pulmonary COPD,  COPD inhaler, former smoker   breath sounds clear to auscultation       Cardiovascular hypertension, + Peripheral Vascular Disease   Rhythm:Regular Rate:Normal     Neuro/Psych CVA    GI/Hepatic Neg liver ROS,GERD  ,,  Endo/Other  diabetes, Type 2    Renal/GU Renal disease     Musculoskeletal negative musculoskeletal ROS (+)    Abdominal Normal abdominal exam  (+)   Peds  Hematology  (+) Blood dyscrasia, anemia   Anesthesia Other Findings Past Medical History: 07/23/2017: Adenocarcinoma of cecum (HCC) No date: [name] No date: Cataract No date: Chronic kidney disease     Comment:  kidney stone No date: Colitis 06/2017: Colon cancer (HCC)     Comment:  cecum No date: Diabetes mellitus without complication (HCC)     Comment:  no meds taken No date: Emphysema of lung (HCC) No date: GERD (gastroesophageal reflux disease) No date: Hyperlipidemia No date: Hypertension No date: Peripheral arterial disease No date: [name] No date: Stroke [facility])     Comment:  November 2016- Thanksgiving night   Reproductive/Obstetrics                              Anesthesia Physical Anesthesia Plan  ASA: 3  Anesthesia Plan: MAC   Post-op Pain Management: Minimal or no pain anticipated   Induction: Intravenous  PONV Risk Score and Plan: 2 and Ondansetron   Airway Management Planned: Natural Airway and Nasal Cannula  Additional Equipment: None  Intra-op Plan:   Post-operative Plan:   Informed Consent: I have reviewed the patients History and  Physical, chart, labs and discussed the procedure including the risks, benefits and alternatives for the proposed anesthesia with the patient or authorized representative who has indicated his/her understanding and acceptance.     Dental advisory given  Plan Discussed with: CRNA  Anesthesia Plan Comments: (Discussed risks of anesthesia with patient, including possibility of difficulty with spontaneous ventilation under anesthesia necessitating airway intervention, PONV, and rare risks such as cardiac or respiratory or neurological events, and allergic reactions. Discussed the role of CRNA in patient's perioperative care. Patient understands.)         Anesthesia Quick Evaluation  "

## 2024-09-10 NOTE — Op Note (Signed)
 [facility] Patient Name: Sherry Lambert Procedure Date : 09/10/2024 MRN: [id number] Attending MD: Glendia BRAVO. Stacia , MD, [id number] Date of Birth: 1941/08/28 CSN: [id number] Age: 84 Admit Type: Inpatient Procedure:                Upper GI endoscopy Indications:              Iron  deficiency anemia Providers:                Glendia E. Stacia, MD, Heather Ng, RN, Corene Southgate, Technician Referring MD:              Medicines:                Monitored Anesthesia Care Complications:            No immediate complications. Estimated Blood Loss:     Estimated blood loss was minimal. Procedure:                Pre-Anesthesia Assessment:                           - Prior to the procedure, a History and Physical                            was performed, and patient medications and                            allergies were reviewed. The patient's tolerance of                            previous anesthesia was also reviewed. The risks                            and benefits of the procedure and the sedation                            options and risks were discussed with the patient.                            All questions were answered, and informed consent                            was obtained. Prior Anticoagulants: The patient has                            taken no anticoagulant or antiplatelet agents. ASA                            Grade Assessment: III - A patient with severe                            systemic disease. After reviewing the risks and  benefits, the patient was deemed in satisfactory                            condition to undergo the procedure.                           After obtaining informed consent, the endoscope was                            passed under direct vision. Throughout the                            procedure, the patient's blood pressure, pulse, and                            oxygen   saturations were monitored continuously. The                            [id number]-[id number] ([id number]) Olympus endoscope was introduced                            through the mouth, and advanced to the second part                            of duodenum. The upper GI endoscopy was technically                            difficult and complex due to narrowing. Successful                            completion of the procedure was aided by                            withdrawing the scope and replacing with the                            neonatal endoscope. The patient tolerated the                            procedure well. The GIF-[id number] ([id number]) Olympus                            endoscope was introduced through the mouth, and                            advanced to the second part of duodenum. Scope In: Scope Out: Findings:      The examined portions of the nasopharynx, oropharynx and larynx were       normal.      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 The web was       traversed with moderate resistance with the standard endoscope. A       moderate sized mucosal rent was produced with passage of the endoscope       and the endoscope  was able to easily traverse the web easily following       initial passage.      One benign-appearing, intrinsic severe stenosis was found 30 cm from the       incisors (GEJ). This stenosis measured 4 mm (inner diameter) x less than       one cm (in length). The stenosis was traversed only after downsizing       scope and there was moderate resistance to passage of the ultraslim       endoscope. The ultraslim scope was used to examine the stomach and       esophagus and to take biopsies of the abnormal mucosa in the cardia. The       ultraslim scope was then withdrawn and replaced with the standard       endoscope. A TTS dilator was passed through the scope. Dilation with a       6-7-8 mm balloon dilator was performed to 8 mm. The dilation site was        examined and showed moderate improvement in luminal narrowing. Estimated       blood loss was minimal. Biopsies were taken of the stenosis with a cold       forceps for histology. Estimated blood loss was minimal.      Localized mucosal changes characterized by inflammation were found in       the cardia. Biopsies were taken with a cold forceps for histology.       Estimated blood loss was minimal.      A large hiatal hernia (10 cm) was present resulting in a foreshortened       tortuous esophagus.      The exam of the stomach was otherwise normal.      The examined duodenum was normal. Impression:               - The examined portions of the nasopharynx,                            oropharynx and larynx were normal.                           - Web in the upper third of the esophagus.                           - Severe, benign-appearing esophageal stenosis.                            Dilated. Biopsied.                           - Inflamed mucosa in the cardia. Biopsied to                            exclude malignancy.                           - Large hiatal hernia.                           - Normal examined duodenum.                           -  No endoscopic abnormalities to explain patient's                            iron  deficiency anemia. Moderate Sedation:      N/A Recommendation:           - Return patient to hospital ward for ongoing care.                           - Clear liquid diet today given multiple dilations.                            Can advance to regular diet this evening if no pain                            with swallowing                           - Use a proton pump inhibitor PO daily indefinitely.                           - Patient had previously declined colonoscopy. Will                            discuss again with patient. Given patient's history                            of remote colon cancer with no subsequent                            surveillance,  very high suspicion for recurrent                            malignancy as cause of patient's iron  deficiency                            anemia. Procedure Code(s):        --- Professional ---                           [phone], Esophagogastroduodenoscopy, flexible,                            transoral; with transendoscopic balloon dilation of                            esophagus (less than 30 mm diameter)                           43239, 59, Esophagogastroduodenoscopy, flexible,                            transoral; with biopsy, single or multiple Diagnosis Code(s):        --- Professional ---  Q39.4, Esophageal web                           K22.2, Esophageal obstruction                           K29.70, Gastritis, unspecified, without bleeding                           K44.9, Diaphragmatic hernia without obstruction or                            gangrene                           D50.9, Iron  deficiency anemia, unspecified CPT copyright 2022 American [employer] Association. All rights reserved. The codes documented in this report are preliminary and upon coder review may  be revised to meet current compliance requirements. Leeana Creer E. Stacia, MD 09/10/2024 10:51:34 AM This report has been signed electronically. Number of Addenda: 0

## 2024-09-10 NOTE — Interval H&P Note (Signed)
 History and Physical Interval Note:  09/10/2024 10:02 AM  Sherry Lambert  has presented today for surgery, with the diagnosis of iron  deficiency anemia.  The various methods of treatment have been discussed with the patient and family. After consideration of risks, benefits and other options for treatment, the patient has consented to  Procedures: EGD (ESOPHAGOGASTRODUODENOSCOPY) (N/A) as a surgical intervention.  The patient's history has been reviewed, patient examined, no change in status, stable for surgery.  I have reviewed the patient's chart and labs.  Questions were answered to the patient's satisfaction.      Sherry Lambert

## 2024-09-10 NOTE — Progress Notes (Signed)
 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  She was complaining of progressive worsening dyspnea on exertion and fatigue.  She is chronically on 4 L of oxygen  at home for COPD.  No history of hematochezia, hematemesis masses or melena.  On presentation, she was hemodynamically stable.  Labs showed WC count of 11.7, hemoglobin 4.7, iron  level less than 10.  No evidence of active GI bleed on rectal exam, FOBT negative.  Started on Protonix , GI consulted.  EGD done today without finding of any active source of bleeding.   Assessment & Plan:  Principal Problem:   Symptomatic anemia Active Problems:   Hyperlipidemia associated with type 2 diabetes mellitus (HCC)   COPD (chronic obstructive pulmonary disease) (HCC)   History of CVA (cerebrovascular accident)   GERD (gastroesophageal reflux disease)   Iron  deficiency anemia due to chronic blood loss   History of colon cancer   Severe symptomatic anemia/iron  deficiency anemia: Hemoglobin of 12 as per lab 1 year ago.  Presented with hemoglobin 4.7.  History of esophageal stenosis status post dilatation, gastritis.  No report of hematemesis, hematochezia or melena.  Takes aspirin  81 mg daily. Iron  level of less than 10.  Given 2 units of PRBC.  Continue Protonix .  FOBT negative.  GI following.  Hemoglobin in the range of 10 today.  Underwent EGD today. Did not show any episodes of bleeding but showed severe, benign-appearing esophageal stenosis s/p dilation.Large hiatal hernia.  GI recommended PPI indefinitely  Leukocytosis: Mild.  Most likely reactive.  Resolved  Mildly   elevated proBNP: Last echo done on 2015 showed EF of 65 to 70%, grade 1 diastolic dysfunction.  Does not appear to be volume overloaded.  Chest x-ray without pulm edema.  Repeat echo ordered which showed EF of 55 to 60%, normal diastolic parameters.  Patient appears euvolemic  COPD/chronic hypoxic respiratory  failure: On 4 L of oxygen  currently.  Currently at baseline.  On chronic prednisone  therapy.  Continue bronchodilators as needed  History of CVA: Has residual right-sided weakness.  On aspirin  at home.  Currently on hold.  Takes pravastatin .  Will consult PT and OT when appropriate.  She is nonambulatory at baseline.  Has a caretaker at home.  Hiatal hernia/GERD: Continue PPI  History of prediabetes: A1c of 6 as per December 24  Chronic pain/neuropathy: On Gabapentin   Leukocytosis: Unclear etiology.  Most likely reactive  Debility/deconditioning: Patient is nonambulatory at baseline.PT consulted        DVT prophylaxis:SCDs Start: 09/09/24 0453     Code Status: Limited: Do not attempt resuscitation (DNR) -DNR-LIMITED -Do Not Intubate/DNI   Family Communication: None at bedside  Patient status:Inpatient  Patient is from :home  Anticipated discharge un:ynfz  Estimated DC date:tomorrow   Consultants: GI  Procedures:EGD  Antimicrobials:  Anti-infectives (From admission, onward)    None       Subjective: Patient seen and examined at bedside today.  Hemodynamically stable.  Just came from EGD.  Still sleepy/drowsy.  Denies new complaints today.  Objective: Vitals:   09/10/24 1045 09/10/24 1100 09/10/24 1110 09/10/24 1159  BP: (!) 106/51 (!) 76/54 97/80 ROLLEN)  122/49  Pulse: 76 85 74 71  Resp: 16 16 20 19   Temp:    98.4 F (36.9 C)  TempSrc:    Axillary  SpO2: 99% 96% 100% 100%  Weight:      Height:        Intake/Output Summary (Last 24 hours) at 09/10/2024 1318 Last data filed at 09/10/2024 1200 Gross per 24 hour  Intake 400 ml  Output 500 ml  Net -100 ml    Filed Weights   09/09/24 1300  Weight: 47.6 kg    Examination:  General exam: Overall comfortable, not in distress, pleasant elderly female, sleepy/drowsy HEENT: PERRL Respiratory system:  no wheezes or crackles  Cardiovascular system: S1 & S2 heard, RRR.  Gastrointestinal system: Abdomen is nondistended, soft and nontender. Central nervous system: Alert and oriented Extremities: No edema, no clubbing ,no cyanosis Skin: No rashes, no ulcers,no icterus     Data Reviewed: I have personally reviewed following labs and imaging studies  CBC: Recent Labs  Lab 09/08/24 1544 09/08/24 2025 09/09/24 0341 09/09/24 1242 09/10/24 0602  WBC 11.6* 11.7* 10.4  --  15.9*  NEUTROABS  --  10.3*  --   --   --   HGB 5.3 Repeated and verified X2.* 4.7* 4.7* 9.4* 10.4*  HCT 19.3 Repeated and verified X2.* 17.6* 17.9* 29.8* 33.9*  MCV 67.1 Repeated and verified X2.* 71.0* 72.2*  --  79.0*  PLT 328.0 346 331  --  267   Basic Metabolic Panel: Recent Labs  Lab 09/08/24 1544 09/08/24 2025 09/10/24 0602  NA 142 144 142  K 4.3 4.1 4.5  CL 105 106 106  CO2 32 29 29  GLUCOSE 184* 127* 99  BUN 18 18 11   CREATININE 0.87 0.86 0.85  CALCIUM  9.2 9.5 9.7     No results found for this or any previous visit (from the past 240 hours).   Radiology Studies: ECHOCARDIOGRAM COMPLETE Result Date: 09/09/2024    ECHOCARDIOGRAM REPORT   Patient Name:   Sherry Lambert Date of Exam: 09/09/2024 Medical Rec #:  [id number]      Height:       59.0 in Accession #:    [id number]     Weight:       105.0 lb Date of Birth:  1941-06-10      BSA:          1.403 m Patient Age:    83 years       BP:           130/60 mmHg Patient Gender: F              HR:           80 bpm. Exam Location:  Inpatient Procedure: 2D Echo, Cardiac Doppler and Color Doppler (Both Spectral and Color            Flow Doppler were utilized during procedure). Indications:    Dyspnea  History:        Patient has prior history of Echocardiogram  examinations, most                 recent 07/30/2014. COPD and Stroke, Signs/Symptoms:Dyspnea and                 [name]; Risk Factors:Dyslipidemia, Diabetes,                 Hypertension, Former Smoker and Sleep Apnea.  Sonographer:    Juliene Rucks Referring Phys: [phone]  VASUNDHRA RATHORE  Sonographer Comments: Image acquisition challenging due to COPD. IMPRESSIONS  1. Left ventricular ejection fraction, by estimation, is 55 to 60%. The left ventricle has normal function. The left ventricle has no regional wall motion abnormalities. Left ventricular diastolic parameters were normal.  2. Right ventricular systolic function is normal. The right ventricular size is normal.  3. The mitral valve is normal in structure. Mild mitral valve regurgitation. No evidence of mitral stenosis.  4. The aortic valve is tricuspid. Aortic valve regurgitation is not visualized. Aortic valve sclerosis is present, with no evidence of aortic valve stenosis.  5. The inferior vena cava is normal in size with greater than 50% respiratory variability, suggesting right atrial pressure of 3 mmHg. FINDINGS  Left Ventricle: Left ventricular ejection fraction, by estimation, is 55 to 60%. The left ventricle has normal function. The left ventricle has no regional wall motion abnormalities. The left ventricular internal cavity size was normal in size. There is  no left ventricular hypertrophy. Left ventricular diastolic parameters were normal. Right Ventricle: The right ventricular size is normal. No increase in right ventricular wall thickness. Right ventricular systolic function is normal. Left Atrium: Left atrial size was normal in size. Right Atrium: Right atrial size was normal in size. Pericardium: There is no evidence of pericardial effusion. Mitral Valve: The mitral valve is normal in structure. Mild mitral valve regurgitation. No evidence of mitral valve stenosis. Tricuspid Valve: The tricuspid valve is normal in structure.  Tricuspid valve regurgitation is trivial. No evidence of tricuspid stenosis. Aortic Valve: The aortic valve is tricuspid. Aortic valve regurgitation is not visualized. Aortic valve sclerosis is present, with no evidence of aortic valve stenosis. Pulmonic Valve: The pulmonic valve was normal in structure. Pulmonic valve regurgitation is not visualized. No evidence of pulmonic stenosis. Aorta: The aortic root is normal in size and structure. Venous: The inferior vena cava is normal in size with greater than 50% respiratory variability, suggesting right atrial pressure of 3 mmHg. IAS/Shunts: No atrial level shunt detected by color flow Doppler.  LEFT VENTRICLE PLAX 2D LVIDd:         3.70 cm   Diastology LVIDs:         2.70 cm   LV e' medial:    8.27 cm/s LV PW:         1.00 cm   LV E/e' medial:  12.8 LV IVS:        1.10 cm   LV e' lateral:   7.70 cm/s LVOT diam:     1.80 cm   LV E/e' lateral: 13.8 LV SV:         56 LV SV Index:   40 LVOT Area:     2.54 cm  RIGHT VENTRICLE             IVC RV Basal diam:  2.50 cm     IVC diam: 1.30 cm RV Mid diam:    2.30 cm RV S prime:     10.40 cm/s TAPSE (M-mode): 1.5 cm LEFT ATRIUM             Index        RIGHT ATRIUM          Index LA diam:        3.30 cm 2.35 cm/m   RA Area:     8.17 cm LA Vol (A2C):   32.0 ml 22.82 ml/m  RA Volume:   15.40 ml 10.98 ml/m LA Vol (A4C):   20.1  ml 14.33 ml/m LA Biplane Vol: 25.5 ml 18.18 ml/m  AORTIC VALVE LVOT Vmax:   111.00 cm/s LVOT Vmean:  73.700 cm/s LVOT VTI:    0.220 m  AORTA Ao Root diam: 2.70 cm MITRAL VALVE MV Area (PHT): 4.60 cm     SHUNTS MV Decel Time: 165 msec     Systemic VTI:  0.22 m MV E velocity: 106.00 cm/s  Systemic Diam: 1.80 cm MV A velocity: 114.00 cm/s MV E/A ratio:  0.93 Morene Brownie Electronically signed by Morene Brownie Signature Date/Time: 09/09/2024/1:36:10 PM    Final    DG Chest Port 1 View Result Date: 09/08/2024 CLINICAL DATA:  Short of breath EXAM: PORTABLE CHEST 1 VIEW COMPARISON:  10/29/2022 FINDINGS:  Borderline cardiac enlargement. Moderate hiatal hernia. No focal consolidation or effusion. Aortic atherosclerosis. Chronic right-sided rib fractures. Emphysema. IMPRESSION: No active disease. Emphysema. Moderate hiatal hernia. Electronically Signed   By: Luke Bun M.D.   On: 09/08/2024 20:16    Scheduled Meds:  sodium chloride    Intravenous Once   arformoterol   15 mcg Nebulization BID   budesonide   0.5 mg Nebulization BID   gabapentin   600 mg Oral BID   pantoprazole  (PROTONIX ) IV  40 mg Intravenous Q24H   pravastatin   20 mg Oral Daily   predniSONE   10 mg Oral Q breakfast   Continuous Infusions:  sodium chloride        LOS: 2 days   Ivonne Mustache, MD [facility] P1/04/2025, 1:18 PM   "

## 2024-09-10 NOTE — Progress Notes (Signed)
 OT Cancellation Note  Patient Details Name: Sherry Lambert MRN: [id number] DOB: 07-15-1941   Cancelled Treatment:    Reason Eval/Treat Not Completed: (P) Patient declined, States she does not want another bill added to her hospital stay. Educated Pt about importance of OT and to ensure she has the help she needs at home, reports she has caregiver M-F 9am-1pm to help her out of bed, breakfast/lunch, and then after work her family who lives next door assists her with all needs. Family assists with all needs on weekend. Pt gets to w/c and typically able to transfer to [facility] and back mod I. Will check back tomorrow with Pt to ensure all needs met.   Elouise JONELLE Bott 09/10/2024, 12:17 PM

## 2024-09-10 NOTE — Plan of Care (Signed)
" °  Problem: Education: Goal: Knowledge of General Education information will improve Description: Including pain rating scale, medication(s)/side effects and non-pharmacologic comfort measures Outcome: Progressing   Problem: Clinical Measurements: Goal: Ability to maintain clinical measurements within normal limits will improve Outcome: Progressing   Problem: Clinical Measurements: Goal: Will remain free from infection Outcome: Progressing   Problem: Clinical Measurements: Goal: Diagnostic test results will improve Outcome: Progressing   Problem: Clinical Measurements: Goal: Respiratory complications will improve Outcome: Progressing   Problem: Clinical Measurements: Goal: Cardiovascular complication will be avoided Outcome: Progressing   Problem: Activity: Goal: Risk for activity intolerance will decrease Outcome: Progressing   Problem: Nutrition: Goal: Adequate nutrition will be maintained Outcome: Progressing    Problem: Safety: Goal: Ability to remain free from injury will improve Outcome: Progressing   Problem: Skin Integrity: Goal: Risk for impaired skin integrity will decrease Outcome: Progressing   Problem: Skin Integrity: Goal: Risk for impaired skin integrity will decrease Outcome: Progressing     "

## 2024-09-11 ENCOUNTER — Other Ambulatory Visit (HOSPITAL_COMMUNITY): Payer: Self-pay

## 2024-09-11 ENCOUNTER — Encounter (HOSPITAL_COMMUNITY): Payer: Self-pay | Admitting: Gastroenterology

## 2024-09-11 LAB — CBC
HCT: 32.5 % — ABNORMAL LOW (ref 36.0–46.0)
Hemoglobin: 10 g/dL — ABNORMAL LOW (ref 12.0–15.0)
MCH: 24.8 pg — ABNORMAL LOW (ref 26.0–34.0)
MCHC: 30.8 g/dL (ref 30.0–36.0)
MCV: 80.4 fL (ref 80.0–100.0)
Platelets: 259 K/uL (ref 150–400)
RBC: 4.04 MIL/uL (ref 3.87–5.11)
RDW: 21.2 % — ABNORMAL HIGH (ref 11.5–15.5)
WBC: 11.8 K/uL — ABNORMAL HIGH (ref 4.0–10.5)
nRBC: 0 % (ref 0.0–0.2)

## 2024-09-11 MED ORDER — FERROUS SULFATE 325 (65 FE) MG PO TABS
325.0000 mg | ORAL_TABLET | Freq: Every day | ORAL | 0 refills | Status: AC
  Filled 2024-09-11: qty 60, 60d supply, fill #0

## 2024-09-11 MED ORDER — FERROUS SULFATE 325 (65 FE) MG PO TBEC: 325.0000 mg | DELAYED_RELEASE_TABLET | Freq: Every day | ORAL | Status: DC

## 2024-09-11 MED ORDER — OMEPRAZOLE 40 MG PO CPDR
40.0000 mg | DELAYED_RELEASE_CAPSULE | Freq: Every day | ORAL | 0 refills | Status: AC
  Filled 2024-09-11: qty 90, 90d supply, fill #0

## 2024-09-11 NOTE — Evaluation (Signed)
 Physical Therapy Evaluation Patient Details Name: Sherry Lambert MRN: [id number] DOB: [birth year] Today's Date: 09/11/2024  History of Present Illness  Pt is an 84 y.o. F presenting to [facility] on 09/08/24 for evaluation of SOB and anemia; HgB 5.3. PMH is significant for COPD, CVA w/ R sided  weakness, hiatal hernia, GERD, and prediabetes.  Clinical Impression  Prior to admittance, pt was needing physical assistance for all mobility and ADLs. With assistance, pt was performing stand pivot transfers from bed to WC. Pt lives next door to son and his wife, and has a friend who comes in 5 times per week to assist her with ADLs and mobility. Pt presents to evaluation with deficits in mobility, strength, power, and activity tolerance. Pt was able to perform bed mobility with minimal physical assistance and required moderate physical assistance for stand pivot transfer to recliner. PT will continue to treat pt while she is admitted. Recommending HHPT at discharge to address remaining mobility deficits and optimize return to PLOF.         If plan is discharge home, recommend the following: Assist for transportation   Can travel by private vehicle        Equipment Recommendations None recommended by PT  Recommendations for Other Services       Functional Status Assessment Patient has had a recent decline in their functional status and demonstrates the ability to make significant improvements in function in a reasonable and predictable amount of time.     Precautions / Restrictions Precautions Precautions: Fall Recall of Precautions/Restrictions: Intact Restrictions Weight Bearing Restrictions Per Provider Order: No      Mobility  Bed Mobility Overal bed mobility: Needs Assistance Bed Mobility: Supine to Sit     Supine to sit: Min assist, HOB elevated     General bed mobility comments: Pt completes supine to sit on L side of bed with HOB elevated. Pt is able to advance BLEs towards EOB and  then uses L bed railing to pull to sitting EOB with minimal physical assistance. Pt then requires physical assist via bed pad for obtaining neutral position of hips. VC for sequencing; increased time to complete.    Transfers Overall transfer level: Needs assistance Equipment used: 1 person hand held assist Transfers: Sit to/from Stand, Bed to chair/wheelchair/BSC Sit to Stand: Mod assist Stand pivot transfers: Mod assist         General transfer comment: Pt completed STS from EOB with moderate physical assistance for initial power-up with therapist standing anterior to patient, and patient holding onto therapist's elbows. Pt then completed stand pivot transfer to recliner on the L with moderate physical assistance. VC for sequencing; increased time to complete.    Ambulation/Gait                  Stairs            Wheelchair Mobility     Tilt Bed    Modified Rankin (Stroke Patients Only)       Balance Overall balance assessment: Needs assistance Sitting-balance support: No upper extremity supported, Feet supported Sitting balance-Leahy Scale: Fair Sitting balance - Comments: seated EOB   Standing balance support: Bilateral upper extremity supported, During functional activity, Reliant on assistive device for balance Standing balance-Leahy Scale: Poor Standing balance comment: reliant on external support                             Pertinent Vitals/Pain Pain  Assessment Pain Assessment: No/denies pain    Home Living Family/patient expects to be discharged to:: Private residence Living Arrangements: Alone Available Help at Discharge: Family;Available 24 hours/day;Friend(s) (son and his wife live next door and pt has a friend who assists her 5 days per week with mobility and ADLs) Type of Home: House Home Access: Ramped entrance       Home Layout: One level Home Equipment: Agricultural Consultant (2 wheels);Shower seat;Toilet riser;Grab bars -  toilet;Grab bars - tub/shower;Wheelchair - manual      Prior Function Prior Level of Function : Needs assist       Physical Assist : Mobility (physical);ADLs (physical) Mobility (physical): Bed mobility;Transfers ADLs (physical): Grooming;Bathing;Dressing;Toileting Mobility Comments: Pt reports needing physical assistance for all mobility tasks at baseline, and completes a stand pivot transfer to [facility] from bed and bathroom. ADLs Comments: pt reports needing assistance for all ADLs     Extremity/Trunk Assessment   Upper Extremity Assessment Upper Extremity Assessment: Generalized weakness    Lower Extremity Assessment Lower Extremity Assessment: Generalized weakness    Cervical / Trunk Assessment Cervical / Trunk Assessment: Kyphotic  Communication   Communication Communication: No apparent difficulties    Cognition Arousal: Alert Behavior During Therapy: WFL for tasks assessed/performed   PT - Cognitive impairments: No apparent impairments                         Following commands: Intact       Cueing Cueing Techniques: Verbal cues     General Comments General comments (skin integrity, edema, etc.): SpO2 88% on 3L    Exercises     Assessment/Plan    PT Assessment Patient needs continued PT services  PT Problem List Decreased strength;Decreased activity tolerance;Decreased balance;Decreased mobility       PT Treatment Interventions DME instruction;Functional mobility training;Therapeutic activities;Therapeutic exercise;Balance training;Patient/family education;Wheelchair mobility training;Manual techniques    PT Goals (Current goals can be found in the Care Plan section)  Acute Rehab PT Goals Patient Stated Goal: to go home PT Goal Formulation: With patient Time For Goal Achievement: 09/25/24 Potential to Achieve Goals: Good    Frequency Min 1X/week     Co-evaluation               AM-PAC PT 6 Clicks Mobility  Outcome Measure  Help needed turning from your back to your side while in a flat bed without using bedrails?: A Little Help needed moving from lying on your back to sitting on the side of a flat bed without using bedrails?: A Little Help needed moving to and from a bed to a chair (including a wheelchair)?: A Lot Help needed standing up from a chair using your arms (e.g., wheelchair or bedside chair)?: A Lot Help needed to walk in hospital room?: Total Help needed climbing 3-5 steps with a railing? : Total 6 Click Score: 12    End of Session Equipment Utilized During Treatment: Gait belt;Oxygen  Activity Tolerance: Patient tolerated treatment well Patient left: in chair;with call bell/phone within reach;with chair alarm set Nurse Communication: Mobility status;Other (comment) (stand pivot transfer) PT Visit Diagnosis: Unsteadiness on feet (R26.81);Other abnormalities of gait and mobility (R26.89);Muscle weakness (generalized) (M62.81)    Time: 9060-8985 PT Time Calculation (min) (ACUTE ONLY): 35 min   Charges:   PT Evaluation $PT Eval Moderate Complexity: 1 Mod   PT General Charges $$ ACUTE PT VISIT: 1 Visit         Leontine Hilt DPT Acute Rehab  Services [phone] Prefer contact via chat   Leontine NOVAK Dayonna Selbe 09/11/2024, 10:23 AM

## 2024-09-11 NOTE — TOC Initial Note (Signed)
 Transition of Care [facility]) - Initial/Assessment Note    Patient Details  Name: Sherry Lambert MRN: [id number] Date of Birth: [birth year]  [facility] CM/SW Contact:    Sudie Erminio Deems, RN Phone Number: 09/11/2024, 12:01 PM  Clinical Narrative: Patient presented for symptomatic anemia. PTA patient was from home alone; however, family will be in the home post hospitalization with the patient. Patient has DME cane and rolling walker. Patient has PCP and son states patient has transportation to appointments. PT/OT recommendations for [facility] PT/OT and son is agreeable to services. Medicare.gov list reviewed over the phone and son did not have a preference. Clinicals submitted via the hub and [facility] can service the patient. Office to call the patient within 24-48 hours post discharge. No further needs identified at this time.                  Expected Discharge Plan: Home w Home Health Services Barriers to Discharge: No Barriers Identified   Patient Goals and CMS Choice Patient states their goals for this hospitalization and ongoing recovery are:: Plan to return home with home health   Choice offered to / list presented to : Patient, Adult Children (reviewed Medicare.gov list on line- son did not have a preference- submitted via the hub [facility] Accepted.)      Expected Discharge Plan and Services   Discharge Planning Services: CM Consult Post Acute Care Choice: Home Health Living arrangements for the past 2 months: Single Family Home Expected Discharge Date: 09/11/24                 DME Agency: NA       HH Arranged: OT, PT HH Agency: [facility] Date HH Agency Contacted: 09/11/24 Time HH Agency Contacted: [id number] Representative spoke with at [facility] Agency: Hub  Prior Living Arrangements/Services Living arrangements for the past 2 months: Single Family Home Lives with:: Self (son's mother in law to assist with care.) Patient language and need for interpreter  reviewed:: Yes Do you feel safe going back to the place where you live?: Yes      Need for Family Participation in Patient Care: Yes (Comment) Care giver support system in place?: Yes (comment) Current home services: DME (Cane and rolling walker.)    Activities of Daily Living   ADL Screening (condition at time of admission) Independently performs ADLs?: No Does the patient have a NEW difficulty with bathing/dressing/toileting/self-feeding that is expected to last >3 days?: Yes (Initiates electronic notice to provider for possible OT consult) Does the patient have a NEW difficulty with getting in/out of bed, walking, or climbing stairs that is expected to last >3 days?: No Does the patient have a NEW difficulty with communication that is expected to last >3 days?: No Is the patient deaf or have difficulty hearing?: No Does the patient have difficulty seeing, even when wearing glasses/contacts?: No Does the patient have difficulty concentrating, remembering, or making decisions?: No  Permission Sought/Granted Permission sought to share information with : Case Manager, Magazine Features Editor, Family Supports Permission granted to share information with : Yes, Verbal Permission Granted     Permission granted to share info w AGENCY: [facility]        Emotional Assessment Appearance:: Appears stated age Attitude/Demeanor/Rapport: Engaged Affect (typically observed): Appropriate Orientation: : Oriented to Self, Oriented to Place, Oriented to  Time Alcohol  / Substance Use: Not Applicable Psych Involvement: No (comment)  Admission diagnosis:  Symptomatic anemia [D64.9] Patient Active  Problem List   Diagnosis Date Noted   History of CVA (cerebrovascular accident) 09/09/2024   GERD (gastroesophageal reflux disease) 09/09/2024   Iron  deficiency anemia due to chronic blood loss 09/09/2024   History of colon cancer 09/09/2024   Symptomatic anemia 09/08/2024   Chronic respiratory  failure with hypoxia (HCC) 07/19/2022   Arm injury, right, initial encounter 07/12/2022   Seasonal allergic rhinitis due to pollen 12/23/2020   Blue toes 11/04/2020   COPD (chronic obstructive pulmonary disease) (HCC) 11/06/2019   Esophageal dysphagia    Venous insufficiency 11/29/2017   Urinary incontinence 11/29/2017   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    Class: Acute   Mass in rectum    Essential hypertension 12/20/2014   Routine general medical examination at a health care facility 10/22/2014   RUQ pain 10/08/2014   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) 08/04/2014   Peripheral arterial disease 01/05/2014   Osteopenia 11/12/2013   Hyperlipidemia associated with type 2 diabetes mellitus (HCC) 09/19/2013   Esophageal stricture 12/23/2012   Diabetes mellitus type 2, controlled, 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 [place] [address] Phone: [phone] Fax: [phone]  [facility] [address] [place] [address] Phone: [phone] Fax: [phone]     Social Drivers of Health (SDOH) Social History: SDOH Screenings   Food Insecurity: No Food Insecurity (09/09/2024)  Housing: Low Risk (09/09/2024)  Transportation Needs: No Transportation Needs (09/09/2024)  Utilities: Not At Risk (09/09/2024)  Alcohol  Screen: Low Risk (08/30/2023)  Depression (PHQ2-9): Medium Risk (08/30/2023)  Financial Resource Strain: Low Risk (08/30/2023)  Physical Activity: Insufficiently Active (08/30/2023)  Social Connections: Moderately Integrated (09/09/2024)  Stress: No Stress Concern Present (08/30/2023)  Tobacco Use: Medium Risk (09/09/2024)  Health Literacy:  Adequate Health Literacy (08/30/2023)   SDOH Interventions:     Readmission Risk Interventions     No data to display

## 2024-09-11 NOTE — Discharge Summary (Addendum)
 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  She was complaining of progressive worsening dyspnea on exertion and fatigue.  She is chronically on 4 L of oxygen  at home for COPD.  No history of hematochezia, hematemesis masses or melena.  On presentation, she was hemodynamically stable.  Labs showed WC count of 11.7, hemoglobin 4.7, iron  level less than 10.  No evidence of active GI bleed on rectal exam, FOBT negative.  Started on Protonix , GI consulted.  EGD done here without finding of any active source of bleeding.  Patient firmly  declined colonoscopy.  GI cleared for discharge.  Medically stable for discharge today.  Hemoglobin stable this morning.    Following problems were addressed during the hospitalization:  Severe symptomatic anemia/iron  deficiency anemia: Hemoglobin of 12 as per lab 1 year ago.  Presented with hemoglobin 4.7.  History of esophageal stenosis status post dilatation, gastritis.  No report of hematemesis, hematochezia or melena.  Takes aspirin  81 mg daily. Iron  level of less than 10.  Given 2 units of PRBC.   FOBT negative.  Underwent EGD. Did not show any episodes of bleeding but showed severe, benign-appearing esophageal stenosis s/p dilation.Large hiatal hernia.  GI recommended PPI indefinitely.  Patient declined colonoscopy.  Hemoglobin has remained stable.  Continue iron  supplementation.    Leukocytosis: Mild.  Most likely reactive.    Mildly  elevated proBNP: Last echo done on 2015 showed EF of 65 to 70%, grade 1 diastolic dysfunction.  Does not appear to be volume overloaded.  Chest x-ray without pulm edema.  Repeat echo ordered which showed EF of 55 to 60%, normal diastolic parameters.  Patient appears euvolemic   COPD/chronic hypoxic respiratory  failure: On 4 L of oxygen  currently.  Currently at baseline.  On chronic prednisone  therapy.  Continue bronchodilators as needed   History of CVA: Has residual right-sided weakness.  On aspirin  at home.  Currently on hold.  Takes pravastatin .   She is nonambulatory at baseline.  Has a caretaker at home.  GI cleared to resume aspirin  on discharge   Hiatal hernia/GERD: Continue PPI   History of prediabetes: A1c of 6 as per December 24   Chronic pain/neuropathy: On Gabapentin   Debility/deconditioning: Patient is nonambulatory at baseline.PT consulted, no follow-up recommended     Discharge Diagnoses:  Principal Problem:   Symptomatic anemia Active Problems:   Hyperlipidemia associated with type 2 diabetes mellitus (HCC)   COPD (chronic obstructive pulmonary disease) (HCC)   History of CVA (cerebrovascular accident)   GERD (gastroesophageal reflux disease)   Iron  deficiency anemia due to chronic blood loss   History of colon cancer    Discharge Instructions  Discharge Instructions     Diet - low sodium heart healthy   Complete by: As directed    Discharge instructions   Complete by: As directed    1)Please take your medications as instructed 2)Follow up with your PCP in a week.  Do a CBC test to check your hemoglobin.  Increase activity slowly   Complete by: As directed    No wound care   Complete by: As directed       Allergies as of 09/11/2024       Reactions   Lipitor [atorvastatin ] Itching        Medication List     TAKE these medications    acetaminophen  500 MG tablet Commonly known as:  TYLENOL  Take 500 mg by mouth every 6 (six) hours as needed for mild pain (pain score 1-3).   albuterol  108 (90 Base) MCG/ACT inhaler Commonly known as: VENTOLIN  HFA Inhale 1-2 puffs into the lungs every 4 (four) hours as needed for wheezing or shortness of breath.   arformoterol  15 MCG/2ML Nebu Commonly known as: BROVANA  Take 2 mLs (15 mcg total) by nebulization 2 (two) times daily.   aspirin  EC 81 MG tablet Take 1 tablet (81 mg total) by mouth daily.   budesonide  0.5 MG/2ML nebulizer solution Commonly known as: PULMICORT  Take 2 mLs (0.5 mg total) by nebulization in the morning and at bedtime.   ferrous sulfate  325 (65 FE) MG EC tablet Take 1 tablet (325 mg total) by mouth daily with breakfast. Start taking on: September 12, 2024   furosemide  20 MG tablet Commonly known as: Lasix  Take 1 tablet (20 mg total) by mouth daily as needed.   gabapentin  300 MG capsule Commonly known as: NEURONTIN  Take 2 capsules by mouth twice daily   ipratropium-albuterol  0.5-2.5 (3) MG/3ML Soln Commonly known as: DUONEB USE 1 AMPULE IN NEBULIZER EVERY 6 HOURS AS NEEDED What changed: See the new instructions.   mirabegron  ER 50 MG Tb24 tablet Commonly known as: Myrbetriq  Take 1 tablet (50 mg total) by mouth daily.   nystatin  100000 UNIT/ML suspension Commonly known as: MYCOSTATIN  Take 5 mLs (500,000 Units total) by mouth 4 (four) times daily.   omeprazole  40 MG capsule Commonly known as: PRILOSEC Take 1 capsule (40 mg total) by mouth daily.   pravastatin  20 MG tablet Commonly known as: PRAVACHOL  Take 1 tablet by mouth once daily   predniSONE  10 MG tablet Commonly known as: DELTASONE  Take 1 tablet (10 mg total) by mouth daily with breakfast.   REFRESH OP Place 1 drop into both eyes daily as needed (For dry eyes).   traMADol  50 MG tablet Commonly known as: ULTRAM  TAKE 1 TABLET BY MOUTH ONCE DAILY AS NEEDED What changed: reasons to take this   VITAMIN D  PO Take 1 tablet by mouth  daily.        Follow-up Information     Rollene Almarie LABOR, MD. Schedule an appointment as soon as possible for a visit in 1 week(s).   Specialty: Internal Medicine Contact information: [address] [place] [address] [phone]                Allergies[1]  Consultations: GI   Procedures/Studies: ECHOCARDIOGRAM COMPLETE Result Date: 09/09/2024    ECHOCARDIOGRAM REPORT   Patient Name:   JAIRY ANGULO Date of Exam: 09/09/2024 Medical Rec #:  [id number]      Height:       59.0 in Accession #:    [id number]     Weight:       105.0 lb Date of Birth:  Mar 09, 1941      BSA:          1.403 m Patient Age:    83 years       BP:  130/60 mmHg Patient Gender: F              HR:           80 bpm. Exam Location:  Inpatient Procedure: 2D Echo, Cardiac Doppler and Color Doppler (Both Spectral and Color            Flow Doppler were utilized during procedure). Indications:    Dyspnea  History:        Patient has prior history of Echocardiogram examinations, most                 recent 07/30/2014. COPD and Stroke, Signs/Symptoms:Dyspnea and                 [name]; Risk Factors:Dyslipidemia, Diabetes,                 Hypertension, Former Smoker and Sleep Apnea.  Sonographer:    Juliene Rucks Referring Phys: [id number] CJDLWIYMJ RATHORE  Sonographer Comments: Image acquisition challenging due to COPD. IMPRESSIONS  1. Left ventricular ejection fraction, by estimation, is 55 to 60%. The left ventricle has normal function. The left ventricle has no regional wall motion abnormalities. Left ventricular diastolic parameters were normal.  2. Right ventricular systolic function is normal. The right ventricular size is normal.  3. The mitral valve is normal in structure. Mild mitral valve regurgitation. No evidence of mitral stenosis.  4. The aortic valve is tricuspid. Aortic valve regurgitation is not visualized. Aortic valve sclerosis is present, with no evidence of aortic valve  stenosis.  5. The inferior vena cava is normal in size with greater than 50% respiratory variability, suggesting right atrial pressure of 3 mmHg. FINDINGS  Left Ventricle: Left ventricular ejection fraction, by estimation, is 55 to 60%. The left ventricle has normal function. The left ventricle has no regional wall motion abnormalities. The left ventricular internal cavity size was normal in size. There is  no left ventricular hypertrophy. Left ventricular diastolic parameters were normal. Right Ventricle: The right ventricular size is normal. No increase in right ventricular wall thickness. Right ventricular systolic function is normal. Left Atrium: Left atrial size was normal in size. Right Atrium: Right atrial size was normal in size. Pericardium: There is no evidence of pericardial effusion. Mitral Valve: The mitral valve is normal in structure. Mild mitral valve regurgitation. No evidence of mitral valve stenosis. Tricuspid Valve: The tricuspid valve is normal in structure. Tricuspid valve regurgitation is trivial. No evidence of tricuspid stenosis. Aortic Valve: The aortic valve is tricuspid. Aortic valve regurgitation is not visualized. Aortic valve sclerosis is present, with no evidence of aortic valve stenosis. Pulmonic Valve: The pulmonic valve was normal in structure. Pulmonic valve regurgitation is not visualized. No evidence of pulmonic stenosis. Aorta: The aortic root is normal in size and structure. Venous: The inferior vena cava is normal in size with greater than 50% respiratory variability, suggesting right atrial pressure of 3 mmHg. IAS/Shunts: No atrial level shunt detected by color flow Doppler.  LEFT VENTRICLE PLAX 2D LVIDd:         3.70 cm   Diastology LVIDs:         2.70 cm   LV e' medial:    8.27 cm/s LV PW:         1.00 cm   LV E/e' medial:  12.8 LV IVS:        1.10 cm   LV e' lateral:   7.70 cm/s LVOT diam:     1.80 cm  LV E/e' lateral: 13.8 LV SV:         56 LV SV Index:   40 LVOT Area:      2.54 cm  RIGHT VENTRICLE             IVC RV Basal diam:  2.50 cm     IVC diam: 1.30 cm RV Mid diam:    2.30 cm RV S prime:     10.40 cm/s TAPSE (M-mode): 1.5 cm LEFT ATRIUM             Index        RIGHT ATRIUM          Index LA diam:        3.30 cm 2.35 cm/m   RA Area:     8.17 cm LA Vol (A2C):   32.0 ml 22.82 ml/m  RA Volume:   15.40 ml 10.98 ml/m LA Vol (A4C):   20.1 ml 14.33 ml/m LA Biplane Vol: 25.5 ml 18.18 ml/m  AORTIC VALVE LVOT Vmax:   111.00 cm/s LVOT Vmean:  73.700 cm/s LVOT VTI:    0.220 m  AORTA Ao Root diam: 2.70 cm MITRAL VALVE MV Area (PHT): 4.60 cm     SHUNTS MV Decel Time: 165 msec     Systemic VTI:  0.22 m MV E velocity: 106.00 cm/s  Systemic Diam: 1.80 cm MV A velocity: 114.00 cm/s MV E/A ratio:  0.93 Sherry Lambert Electronically signed by Sherry Lambert Signature Date/Time: 09/09/2024/1:36:10 PM    Final    DG Chest Port 1 View Result Date: 09/08/2024 CLINICAL DATA:  Short of breath EXAM: PORTABLE CHEST 1 VIEW COMPARISON:  10/29/2022 FINDINGS: Borderline cardiac enlargement. Moderate hiatal hernia. No focal consolidation or effusion. Aortic atherosclerosis. Chronic right-sided rib fractures. Emphysema. IMPRESSION: No active disease. Emphysema. Moderate hiatal hernia. Electronically Signed   By: Luke Bun M.D.   On: 09/08/2024 20:16      Subjective: Patient seen and examined at bedside today.  Hemodynamically stable.  Overall comfortable.  Lying on bed.  Hemoglobin stable this morning.  Very eager to go home today.  Medically stable for discharge.I called her son Vaughan on phone to update about discharge planning  Discharge Exam: Vitals:   09/11/24 0416 09/11/24 0843  BP: (!) 146/46 (!) 150/56  Pulse: 67 75  Resp: 19 20  Temp: 97.6 F (36.4 C) 97.7 F (36.5 C)  SpO2: 100% 99%   Vitals:   09/10/24 2115 09/11/24 0018 09/11/24 0416 09/11/24 0843  BP: (!) 168/66 131/60 (!) 146/46 (!) 150/56  Pulse: 61 73 67 75  Resp: 19 19 19 20   Temp: 98.3 F (36.8 C) 97.7  F (36.5 C) 97.6 F (36.4 C) 97.7 F (36.5 C)  TempSrc: Oral Oral Oral Oral  SpO2: 100% 100% 100% 99%  Weight:      Height:        General: Pt is alert, awake, not in acute distress Cardiovascular: RRR, S1/S2 +, no rubs, no gallops Respiratory: CTA bilaterally, no wheezing, no rhonchi Abdominal: Soft, NT, ND, bowel sounds + Extremities: no edema, no cyanosis    The results of significant diagnostics from this hospitalization (including imaging, microbiology, ancillary and laboratory) are listed below for reference.     Microbiology: No results found for this or any previous visit (from the past 240 hours).   Labs: BNP (last 3 results) No results for input(s): BNP in the last 8760 hours. Basic Metabolic Panel: Recent Labs  Lab 09/08/24 1544 09/08/24  2025 09/10/24 0602  NA 142 144 142  K 4.3 4.1 4.5  CL 105 106 106  CO2 32 29 29  GLUCOSE 184* 127* 99  BUN 18 18 11   CREATININE 0.87 0.86 0.85  CALCIUM  9.2 9.5 9.7   Liver Function Tests: Recent Labs  Lab 09/08/24 1544 09/08/24 2025  AST 12 12*  ALT 9 7  ALKPHOS 46 55  BILITOT 0.2 <0.2  PROT 6.1 5.8*  ALBUMIN 3.8 3.8   No results for input(s): LIPASE, AMYLASE in the last 168 hours. No results for input(s): AMMONIA in the last 168 hours. CBC: Recent Labs  Lab 09/08/24 1544 09/08/24 2025 09/09/24 0341 09/09/24 1242 09/10/24 0602 09/11/24 0529  WBC 11.6* 11.7* 10.4  --  15.9* 11.8*  NEUTROABS  --  10.3*  --   --   --   --   HGB 5.3 Repeated and verified X2.* 4.7* 4.7* 9.4* 10.4* 10.0*  HCT 19.3 Repeated and verified X2.* 17.6* 17.9* 29.8* 33.9* 32.5*  MCV 67.1 Repeated and verified X2.* 71.0* 72.2*  --  79.0* 80.4  PLT 328.0 346 331  --  267 259   Cardiac Enzymes: No results for input(s): CKTOTAL, CKMB, CKMBINDEX, TROPONINI in the last 168 hours. BNP: Invalid input(s): POCBNP CBG: No results for input(s): GLUCAP in the last 168 hours. D-Dimer No results for input(s): DDIMER  in the last 72 hours. Hgb A1c Recent Labs    09/09/24 0459 09/09/24 0958  HGBA1C 5.8* CANCELED   Lipid Profile Recent Labs    09/08/24 1544  CHOL 134  HDL 75.30  LDLCALC 48  TRIG 49.0  CHOLHDL 2   Thyroid function studies No results for input(s): TSH, T4TOTAL, T3FREE, THYROIDAB in the last 72 hours.  Invalid input(s): FREET3 Anemia work up Recent Labs    09/08/24 2157 09/08/24 2210  VITAMINB12  --  195  FOLATE  --  16.1  FERRITIN  --  8*  TIBC  --  351  IRON   --  <10*  RETICCTPCT 1.9  --    Urinalysis    Component Value Date/Time   COLORURINE STRAW (A) 02/05/2022 0628   APPEARANCEUR CLEAR 02/05/2022 0628   LABSPEC 1.009 02/05/2022 0628   PHURINE 7.0 02/05/2022 0628   GLUCOSEU NEGATIVE 02/05/2022 0628   GLUCOSEU NEGATIVE 01/10/2021 1050   HGBUR NEGATIVE 02/05/2022 0628   BILIRUBINUR NEGATIVE 02/05/2022 0628   BILIRUBINUR Neg 08/26/2018 1053   KETONESUR NEGATIVE 02/05/2022 0628   PROTEINUR NEGATIVE 02/05/2022 0628   UROBILINOGEN 0.2 01/10/2021 1050   NITRITE NEGATIVE 02/05/2022 0628   LEUKOCYTESUR MODERATE (A) 02/05/2022 0628   Sepsis Labs Recent Labs  Lab 09/08/24 2025 09/09/24 0341 09/10/24 0602 09/11/24 0529  WBC 11.7* 10.4 15.9* 11.8*   Microbiology No results found for this or any previous visit (from the past 240 hours).  Please note: You were cared for by a hospitalist during your hospital stay. Once you are discharged, your primary care physician will handle any further medical issues. Please note that NO REFILLS for any discharge medications will be authorized once you are discharged, as it is imperative that you return to your primary care physician (or establish a relationship with a primary care physician if you do not have one) for your post hospital discharge needs so that they can reassess your need for medications and monitor your lab values.    Time coordinating discharge: 40 minutes  SIGNED:   Ivonne Mustache,  MD  [facility] 09/11/2024, 11:35 AM Pager [phone]  If 7PM-7AM, please contact night-coverage [email or web address] [facility] Physician Discharge Summary  Sherry Lambert FMW:[id number] DOB: 02-16-1941 DOA: 09/08/2024  PCP: Rollene Almarie LABOR, MD  Admit date: 09/08/2024 Discharge date: 09/11/2024  Admitted From: Home Disposition:  Home  Discharge Condition:Stable CODE STATUS:FULL, DNR, Comfort Care Diet recommendation: Heart Healthy / Carb Modified / Regular / Dysphagia   Brief/Interim Summary:   Following problems were addressed during the hospitalization:   Discharge Diagnoses:  Principal Problem:   Symptomatic anemia Active Problems:   Hyperlipidemia associated with type 2 diabetes mellitus (HCC)   COPD (chronic obstructive pulmonary disease) (HCC)   History of CVA (cerebrovascular accident)   GERD (gastroesophageal reflux disease)   Iron  deficiency anemia due to chronic blood loss   History of colon cancer    Discharge Instructions  Discharge Instructions     Diet - low sodium heart healthy   Complete by: As directed    Discharge instructions   Complete by: As directed    1)Please take your medications as instructed 2)Follow up with your PCP in a week.  Do a CBC test to check your hemoglobin.   Increase activity slowly   Complete by: As directed    No wound care   Complete by: As directed       Allergies as of 09/11/2024       Reactions   Lipitor [atorvastatin ] Itching        Medication List     TAKE these medications    acetaminophen  500 MG tablet Commonly known as: TYLENOL  Take 500 mg by mouth every 6 (six) hours as needed for mild pain (pain score 1-3).   albuterol  108 (90 Base) MCG/ACT inhaler Commonly known as: VENTOLIN  HFA Inhale 1-2 puffs into the lungs every 4 (four) hours as needed for wheezing or shortness of breath.   arformoterol  15 MCG/2ML Nebu Commonly known as: BROVANA  Take 2 mLs (15 mcg total) by nebulization 2 (two)  times daily.   aspirin  EC 81 MG tablet Take 1 tablet (81 mg total) by mouth daily.   budesonide  0.5 MG/2ML nebulizer solution Commonly known as: PULMICORT  Take 2 mLs (0.5 mg total) by nebulization in the morning and at bedtime.   ferrous sulfate  325 (65 FE) MG EC tablet Take 1 tablet (325 mg total) by mouth daily with breakfast. Start taking on: September 12, 2024   furosemide  20 MG tablet Commonly known as: Lasix  Take 1 tablet (20 mg total) by mouth daily as needed.   gabapentin  300 MG capsule Commonly known as: NEURONTIN  Take 2 capsules by mouth twice daily   ipratropium-albuterol  0.5-2.5 (3) MG/3ML Soln Commonly known as: DUONEB USE 1 AMPULE IN NEBULIZER EVERY 6 HOURS AS NEEDED What changed: See the new instructions.   mirabegron  ER 50 MG Tb24 tablet Commonly known as: Myrbetriq  Take 1 tablet (50 mg total) by mouth daily.   nystatin  100000 UNIT/ML suspension Commonly known as: MYCOSTATIN  Take 5 mLs (500,000 Units total) by mouth 4 (four) times daily.   omeprazole  40 MG capsule Commonly known as: PRILOSEC Take 1 capsule (40 mg total) by mouth daily.   pravastatin  20 MG tablet Commonly known as: PRAVACHOL  Take 1 tablet by mouth once daily   predniSONE  10 MG tablet Commonly known as: DELTASONE  Take 1 tablet (10 mg total) by mouth daily with breakfast.   REFRESH OP Place 1 drop into both eyes daily as needed (For dry eyes).   traMADol  50 MG tablet Commonly known as: ULTRAM  TAKE 1 TABLET BY MOUTH  ONCE DAILY AS NEEDED What changed: reasons to take this   VITAMIN D  PO Take 1 tablet by mouth daily.        Follow-up Information     Rollene Almarie LABOR, MD. Schedule an appointment as soon as possible for a visit in 1 week(s).   Specialty: Internal Medicine Contact information: [address] [place] [address] [phone]                Allergies[2]  Consultations:    Procedures/Studies: ECHOCARDIOGRAM COMPLETE Result Date:  09/09/2024    ECHOCARDIOGRAM REPORT   Patient Name:   Sherry Lambert Date of Exam: 09/09/2024 Medical Rec #:  [id number]      Height:       59.0 in Accession #:    [id number]     Weight:       105.0 lb Date of Birth:  02-Mar-1941      BSA:          1.403 m Patient Age:    83 years       BP:           130/60 mmHg Patient Gender: F              HR:           80 bpm. Exam Location:  Inpatient Procedure: 2D Echo, Cardiac Doppler and Color Doppler (Both Spectral and Color            Flow Doppler were utilized during procedure). Indications:    Dyspnea  History:        Patient has prior history of Echocardiogram examinations, most                 recent 07/30/2014. COPD and Stroke, Signs/Symptoms:Dyspnea and                 [name]; Risk Factors:Dyslipidemia, Diabetes,                 Hypertension, Former Smoker and Sleep Apnea.  Sonographer:    Juliene Rucks Referring Phys: [id number] CJDLWIYMJ RATHORE  Sonographer Comments: Image acquisition challenging due to COPD. IMPRESSIONS  1. Left ventricular ejection fraction, by estimation, is 55 to 60%. The left ventricle has normal function. The left ventricle has no regional wall motion abnormalities. Left ventricular diastolic parameters were normal.  2. Right ventricular systolic function is normal. The right ventricular size is normal.  3. The mitral valve is normal in structure. Mild mitral valve regurgitation. No evidence of mitral stenosis.  4. The aortic valve is tricuspid. Aortic valve regurgitation is not visualized. Aortic valve sclerosis is present, with no evidence of aortic valve stenosis.  5. The inferior vena cava is normal in size with greater than 50% respiratory variability, suggesting right atrial pressure of 3 mmHg. FINDINGS  Left Ventricle: Left ventricular ejection fraction, by estimation, is 55 to 60%. The left ventricle has normal function. The left ventricle has no regional wall motion abnormalities. The left ventricular internal cavity size was  normal in size. There is  no left ventricular hypertrophy. Left ventricular diastolic parameters were normal. Right Ventricle: The right ventricular size is normal. No increase in right ventricular wall thickness. Right ventricular systolic function is normal. Left Atrium: Left atrial size was normal in size. Right Atrium: Right atrial size was normal in size. Pericardium: There is no evidence of pericardial effusion. Mitral Valve: The mitral valve is normal in structure. Mild mitral valve regurgitation. No evidence of  mitral valve stenosis. Tricuspid Valve: The tricuspid valve is normal in structure. Tricuspid valve regurgitation is trivial. No evidence of tricuspid stenosis. Aortic Valve: The aortic valve is tricuspid. Aortic valve regurgitation is not visualized. Aortic valve sclerosis is present, with no evidence of aortic valve stenosis. Pulmonic Valve: The pulmonic valve was normal in structure. Pulmonic valve regurgitation is not visualized. No evidence of pulmonic stenosis. Aorta: The aortic root is normal in size and structure. Venous: The inferior vena cava is normal in size with greater than 50% respiratory variability, suggesting right atrial pressure of 3 mmHg. IAS/Shunts: No atrial level shunt detected by color flow Doppler.  LEFT VENTRICLE PLAX 2D LVIDd:         3.70 cm   Diastology LVIDs:         2.70 cm   LV e' medial:    8.27 cm/s LV PW:         1.00 cm   LV E/e' medial:  12.8 LV IVS:        1.10 cm   LV e' lateral:   7.70 cm/s LVOT diam:     1.80 cm   LV E/e' lateral: 13.8 LV SV:         56 LV SV Index:   40 LVOT Area:     2.54 cm  RIGHT VENTRICLE             IVC RV Basal diam:  2.50 cm     IVC diam: 1.30 cm RV Mid diam:    2.30 cm RV S prime:     10.40 cm/s TAPSE (M-mode): 1.5 cm LEFT ATRIUM             Index        RIGHT ATRIUM          Index LA diam:        3.30 cm 2.35 cm/m   RA Area:     8.17 cm LA Vol (A2C):   32.0 ml 22.82 ml/m  RA Volume:   15.40 ml 10.98 ml/m LA Vol (A4C):   20.1 ml  14.33 ml/m LA Biplane Vol: 25.5 ml 18.18 ml/m  AORTIC VALVE LVOT Vmax:   111.00 cm/s LVOT Vmean:  73.700 cm/s LVOT VTI:    0.220 m  AORTA Ao Root diam: 2.70 cm MITRAL VALVE MV Area (PHT): 4.60 cm     SHUNTS MV Decel Time: 165 msec     Systemic VTI:  0.22 m MV E velocity: 106.00 cm/s  Systemic Diam: 1.80 cm MV A velocity: 114.00 cm/s MV E/A ratio:  0.93 Sherry Lambert Electronically signed by Sherry Lambert Signature Date/Time: 09/09/2024/1:36:10 PM    Final    DG Chest Port 1 View Result Date: 09/08/2024 CLINICAL DATA:  Short of breath EXAM: PORTABLE CHEST 1 VIEW COMPARISON:  10/29/2022 FINDINGS: Borderline cardiac enlargement. Moderate hiatal hernia. No focal consolidation or effusion. Aortic atherosclerosis. Chronic right-sided rib fractures. Emphysema. IMPRESSION: No active disease. Emphysema. Moderate hiatal hernia. Electronically Signed   By: Luke Bun M.D.   On: 09/08/2024 20:16      Subjective:   Discharge Exam: Vitals:   09/11/24 0416 09/11/24 0843  BP: (!) 146/46 (!) 150/56  Pulse: 67 75  Resp: 19 20  Temp: 97.6 F (36.4 C) 97.7 F (36.5 C)  SpO2: 100% 99%   Vitals:   09/10/24 2115 09/11/24 0018 09/11/24 0416 09/11/24 0843  BP: (!) 168/66 131/60 (!) 146/46 (!) 150/56  Pulse: 61 73 67 75  Resp:  19 19 19 20   Temp: 98.3 F (36.8 C) 97.7 F (36.5 C) 97.6 F (36.4 C) 97.7 F (36.5 C)  TempSrc: Oral Oral Oral Oral  SpO2: 100% 100% 100% 99%  Weight:      Height:        General: Pt is alert, awake, not in acute distress Cardiovascular: RRR, S1/S2 +, no rubs, no gallops Respiratory: CTA bilaterally, no wheezing, no rhonchi Abdominal: Soft, NT, ND, bowel sounds + Extremities: no edema, no cyanosis    The results of significant diagnostics from this hospitalization (including imaging, microbiology, ancillary and laboratory) are listed below for reference.     Microbiology: No results found for this or any previous visit (from the past 240 hours).   Labs: BNP  (last 3 results) No results for input(s): BNP in the last 8760 hours. Basic Metabolic Panel: Recent Labs  Lab 09/08/24 1544 09/08/24 2025 09/10/24 0602  NA 142 144 142  K 4.3 4.1 4.5  CL 105 106 106  CO2 32 29 29  GLUCOSE 184* 127* 99  BUN 18 18 11   CREATININE 0.87 0.86 0.85  CALCIUM  9.2 9.5 9.7   Liver Function Tests: Recent Labs  Lab 09/08/24 1544 09/08/24 2025  AST 12 12*  ALT 9 7  ALKPHOS 46 55  BILITOT 0.2 <0.2  PROT 6.1 5.8*  ALBUMIN 3.8 3.8   No results for input(s): LIPASE, AMYLASE in the last 168 hours. No results for input(s): AMMONIA in the last 168 hours. CBC: Recent Labs  Lab 09/08/24 1544 09/08/24 2025 09/09/24 0341 09/09/24 1242 09/10/24 0602 09/11/24 0529  WBC 11.6* 11.7* 10.4  --  15.9* 11.8*  NEUTROABS  --  10.3*  --   --   --   --   HGB 5.3 Repeated and verified X2.* 4.7* 4.7* 9.4* 10.4* 10.0*  HCT 19.3 Repeated and verified X2.* 17.6* 17.9* 29.8* 33.9* 32.5*  MCV 67.1 Repeated and verified X2.* 71.0* 72.2*  --  79.0* 80.4  PLT 328.0 346 331  --  267 259   Cardiac Enzymes: No results for input(s): CKTOTAL, CKMB, CKMBINDEX, TROPONINI in the last 168 hours. BNP: Invalid input(s): POCBNP CBG: No results for input(s): GLUCAP in the last 168 hours. D-Dimer No results for input(s): DDIMER in the last 72 hours. Hgb A1c Recent Labs    09/09/24 0459 09/09/24 0958  HGBA1C 5.8* CANCELED   Lipid Profile Recent Labs    09/08/24 1544  CHOL 134  HDL 75.30  LDLCALC 48  TRIG 49.0  CHOLHDL 2   Thyroid function studies No results for input(s): TSH, T4TOTAL, T3FREE, THYROIDAB in the last 72 hours.  Invalid input(s): FREET3 Anemia work up Recent Labs    09/08/24 2157 09/08/24 2210  VITAMINB12  --  195  FOLATE  --  16.1  FERRITIN  --  8*  TIBC  --  351  IRON   --  <10*  RETICCTPCT 1.9  --    Urinalysis    Component Value Date/Time   COLORURINE STRAW (A) 02/05/2022 0628   APPEARANCEUR CLEAR  02/05/2022 0628   LABSPEC 1.009 02/05/2022 0628   PHURINE 7.0 02/05/2022 0628   GLUCOSEU NEGATIVE 02/05/2022 0628   GLUCOSEU NEGATIVE 01/10/2021 1050   HGBUR NEGATIVE 02/05/2022 0628   BILIRUBINUR NEGATIVE 02/05/2022 0628   BILIRUBINUR Neg 08/26/2018 1053   KETONESUR NEGATIVE 02/05/2022 0628   PROTEINUR NEGATIVE 02/05/2022 0628   UROBILINOGEN 0.2 01/10/2021 1050   NITRITE NEGATIVE 02/05/2022 0628   LEUKOCYTESUR MODERATE (A) 02/05/2022 9371  Sepsis Labs Recent Labs  Lab 09/08/24 2025 09/09/24 0341 09/10/24 0602 09/11/24 0529  WBC 11.7* 10.4 15.9* 11.8*   Microbiology No results found for this or any previous visit (from the past 240 hours).  Please note: You were cared for by a hospitalist during your hospital stay. Once you are discharged, your primary care physician will handle any further medical issues. Please note that NO REFILLS for any discharge medications will be authorized once you are discharged, as it is imperative that you return to your primary care physician (or establish a relationship with a primary care physician if you do not have one) for your post hospital discharge needs so that they can reassess your need for medications and monitor your lab values.    Time coordinating discharge: 40 minutes  SIGNED:   Ivonne Mustache, MD  [facility] 09/11/2024, 11:35 AM Pager [phone]  If 7PM-7AM, please contact night-coverage [email or web address] Password [email or web address]     [1]  Allergies Allergen Reactions   Lipitor [Atorvastatin ] Itching  [2]  Allergies Allergen Reactions   Lipitor [Atorvastatin ] Itching

## 2024-09-11 NOTE — Progress Notes (Signed)
 OT Cancellation Note  Patient Details Name: DESTIN KITTLER MRN: [id number] DOB: [birth year]   Cancelled Treatment:    Reason Eval/Treat Not Completed: (P) Patient declined, worried about being billed for too many things. Pt reports it's difficult to do the things she enjoys around the house as she used to. Per PT, recommending HHPT, I am recommending HHOT for return home to maximize participation in meaningful activities.  Elouise JONELLE Bott 09/11/2024, 11:53 AM

## 2024-09-11 NOTE — Plan of Care (Signed)
" °  Problem: Education: Goal: Knowledge of General Education information will improve Description: Including pain rating scale, medication(s)/side effects and non-pharmacologic comfort measures 09/11/2024 1140 by Elgin Lonni BRAVO, RN Outcome: Adequate for Discharge 09/11/2024 1140 by Elgin Lonni BRAVO, RN Outcome: Adequate for Discharge   Problem: Health Behavior/Discharge Planning: Goal: Ability to manage health-related needs will improve 09/11/2024 1140 by Elgin Lonni BRAVO, RN Outcome: Adequate for Discharge 09/11/2024 1140 by Elgin Lonni BRAVO, RN Outcome: Adequate for Discharge   Problem: Clinical Measurements: Goal: Ability to maintain clinical measurements within normal limits will improve 09/11/2024 1140 by Elgin Lonni BRAVO, RN Outcome: Adequate for Discharge 09/11/2024 1140 by Elgin Lonni BRAVO, RN Outcome: Adequate for Discharge Goal: Will remain free from infection 09/11/2024 1140 by Elgin Lonni BRAVO, RN Outcome: Adequate for Discharge 09/11/2024 1140 by Elgin Lonni BRAVO, RN Outcome: Adequate for Discharge Goal: Diagnostic test results will improve 09/11/2024 1140 by Elgin Lonni BRAVO, RN Outcome: Adequate for Discharge 09/11/2024 1140 by Elgin Lonni BRAVO, RN Outcome: Adequate for Discharge Goal: Respiratory complications will improve 09/11/2024 1140 by Elgin Lonni BRAVO, RN Outcome: Adequate for Discharge 09/11/2024 1140 by Elgin Lonni BRAVO, RN Outcome: Adequate for Discharge Goal: Cardiovascular complication will be avoided 09/11/2024 1140 by Elgin Lonni BRAVO, RN Outcome: Adequate for Discharge 09/11/2024 1140 by Elgin Lonni BRAVO, RN Outcome: Adequate for Discharge   Problem: Clinical Measurements: Goal: Will remain free from infection 09/11/2024 1140 by Elgin Lonni BRAVO, RN Outcome: Adequate for Discharge 09/11/2024 1140 by Elgin Lonni BRAVO, RN Outcome: Adequate for Discharge   Problem: Activity: Goal: Risk for activity  intolerance will decrease 09/11/2024 1140 by Elgin Lonni BRAVO, RN Outcome: Adequate for Discharge 09/11/2024 1140 by Elgin Lonni BRAVO, RN Outcome: Adequate for Discharge   Problem: Nutrition: Goal: Adequate nutrition will be maintained 09/11/2024 1140 by Elgin Lonni BRAVO, RN Outcome: Adequate for Discharge 09/11/2024 1140 by Elgin Lonni BRAVO, RN Outcome: Adequate for Discharge   Problem: Coping: Goal: Level of anxiety will decrease 09/11/2024 1140 by Elgin Lonni BRAVO, RN Outcome: Adequate for Discharge 09/11/2024 1140 by Elgin Lonni BRAVO, RN Outcome: Adequate for Discharge   Problem: Elimination: Goal: Will not experience complications related to bowel motility 09/11/2024 1140 by Elgin Lonni BRAVO, RN Outcome: Adequate for Discharge 09/11/2024 1140 by Elgin Lonni BRAVO, RN Outcome: Adequate for Discharge Goal: Will not experience complications related to urinary retention 09/11/2024 1140 by Elgin Lonni BRAVO, RN Outcome: Adequate for Discharge 09/11/2024 1140 by Elgin Lonni BRAVO, RN Outcome: Adequate for Discharge   Problem: Elimination: Goal: Will not experience complications related to urinary retention 09/11/2024 1140 by Elgin Lonni BRAVO, RN Outcome: Adequate for Discharge 09/11/2024 1140 by Elgin Lonni BRAVO, RN Outcome: Adequate for Discharge   Problem: Pain Managment: Goal: General experience of comfort will improve and/or be controlled 09/11/2024 1140 by Elgin Lonni BRAVO, RN Outcome: Adequate for Discharge 09/11/2024 1140 by Elgin Lonni BRAVO, RN Outcome: Adequate for Discharge   Problem: Acute Rehab PT Goals(only PT should resolve) Goal: Pt Will Go Supine/Side To Sit Outcome: Adequate for Discharge Goal: Pt Will Go Sit To Supine/Side Outcome: Adequate for Discharge Goal: Patient Will Transfer Sit To/From Stand Outcome: Adequate for Discharge Goal: Pt Will Transfer Bed To Chair/Chair To Bed Outcome: Adequate for Discharge   "

## 2024-09-15 LAB — SURGICAL PATHOLOGY

## 2024-09-16 ENCOUNTER — Ambulatory Visit: Payer: Self-pay | Admitting: Gastroenterology

## 2024-09-16 NOTE — Progress Notes (Signed)
 Ms. Sukhu,  The biopsies taken from your stomach were notable for mild chronic gastritis (inflammation) which is a common finding, but there was no evidence of Helicobacter pylori infection. This common finding is not likely to explain iron  deficiency and there is no specific treatment or further evaluation recommended for this finding. The biopsies of your esophagus showed mild inflammatory changes consistent with acid reflux.  No precancerous changes (Barrett's esophagus) were seen. As discussed, a colonoscopy is recommended to further evaluate for a cause of bleeding to explain your severe anemia.  I understand you do want to pursue a colonoscopy as you know that you do not want undergo treatment for colon cancer if that is the cause, but there are other causes of bleeding in the colon that may be amenable treatment.  Please let us  know if you change your mind and want to pursue colonoscopy. I recommend repeating blood tests as an outpatient to look for further drops in your hemoglobin.  This can be done through your primary care office.

## 2024-09-19 ENCOUNTER — Other Ambulatory Visit: Payer: Self-pay | Admitting: Internal Medicine

## 2024-09-21 ENCOUNTER — Ambulatory Visit: Admitting: Internal Medicine

## 2024-09-21 ENCOUNTER — Encounter: Payer: Self-pay | Admitting: Internal Medicine

## 2024-09-21 VITALS — BP 132/70 | HR 75 | Temp 98.3°F | Ht 59.0 in | Wt 105.0 lb

## 2024-09-21 DIAGNOSIS — D500 Iron deficiency anemia secondary to blood loss (chronic): Secondary | ICD-10-CM | POA: Diagnosis not present

## 2024-09-21 LAB — CBC
HCT: 33.8 % — ABNORMAL LOW (ref 36.0–46.0)
Hemoglobin: 10.5 g/dL — ABNORMAL LOW (ref 12.0–15.0)
MCHC: 31.1 g/dL (ref 30.0–36.0)
MCV: 81.4 fl (ref 78.0–100.0)
Platelets: 231 K/uL (ref 150.0–400.0)
RBC: 4.15 Mil/uL (ref 3.87–5.11)
RDW: 29.5 % — ABNORMAL HIGH (ref 11.5–15.5)
WBC: 10.1 K/uL (ref 4.0–10.5)

## 2024-09-21 LAB — FERRITIN: Ferritin: 47.5 ng/mL (ref 10.0–291.0)

## 2024-09-21 NOTE — Assessment & Plan Note (Signed)
 We talked about likely slow GI bleeding internally. Checking CBC and ferritin. Also possibility of polyp/mass colon causing no imaging done recent on abdomen. Also talked about possibility of blood dyscrasia. Adjust as needed.

## 2024-09-21 NOTE — Progress Notes (Signed)
" ° °  Subjective:   Patient ID: Sherry Lambert, female    DOB: Sep 29, 1940, 84 y.o.   MRN: [id number]  The patient is an 84 YO female coming in for hospital follow up (with low Hg around 5 sent to hospital for transfusion and had EGD without source, FOBT negative, levels stable after transfusion inpatient, declined colonoscopy at the time). She is doing well since discharge. More energy and back to self. No shortness of breath like before. Denies change to meds. Taking iron . No noticed bleeding, blood in stool, dark stools.   PMH, [facility], social history reviewed and updated  Medication reconciliation done during visit and updated as appropriate   Review of Systems  Constitutional: Negative.   HENT: Negative.    Eyes: Negative.   Respiratory:  Negative for cough, chest tightness and shortness of breath.   Cardiovascular:  Negative for chest pain, palpitations and leg swelling.  Gastrointestinal:  Negative for abdominal distention, abdominal pain, constipation, diarrhea, nausea and vomiting.  Musculoskeletal:  Positive for gait problem.  Skin: Negative.   Neurological:  Positive for numbness.  Psychiatric/Behavioral: Negative.      Objective:  Physical Exam Constitutional:      Appearance: She is well-developed.  HENT:     Head: Normocephalic and atraumatic.  Cardiovascular:     Rate and Rhythm: Normal rate and regular rhythm.  Pulmonary:     Effort: Pulmonary effort is normal. No respiratory distress.     Breath sounds: Normal breath sounds. No wheezing or rales.  Abdominal:     General: Bowel sounds are normal. There is no distension.     Palpations: Abdomen is soft.     Tenderness: There is no abdominal tenderness.  Musculoskeletal:     Cervical back: Normal range of motion.  Skin:    General: Skin is warm and dry.  Neurological:     Mental Status: She is alert and oriented to person, place, and time. Mental status is at baseline.     Sensory: Sensory deficit present.      Coordination: Coordination abnormal.     Comments: Stable to baseline     Vitals:   09/21/24 1528  BP: 132/70  Pulse: 75  Temp: 98.3 F (36.8 C)  TempSrc: Oral  SpO2: 96%  Weight: 105 lb (47.6 kg)  Height: 4' 11 (1.499 m)   I personally spent a total of 42 minutes in the care of the patient today including preparing to see the patient, performing a medically appropriate exam/evaluation, counseling and educating, placing orders, documenting clinical information in the EHR, and independently interpreting results.  "

## 2024-09-21 NOTE — Patient Instructions (Signed)
 We will recheck the blood work today.

## 2024-09-22 ENCOUNTER — Ambulatory Visit: Payer: Self-pay | Admitting: Internal Medicine

## 2024-09-22 DIAGNOSIS — D500 Iron deficiency anemia secondary to blood loss (chronic): Secondary | ICD-10-CM

## 2024-09-29 ENCOUNTER — Other Ambulatory Visit (HOSPITAL_BASED_OUTPATIENT_CLINIC_OR_DEPARTMENT_OTHER): Payer: Self-pay

## 2024-09-29 ENCOUNTER — Ambulatory Visit (HOSPITAL_BASED_OUTPATIENT_CLINIC_OR_DEPARTMENT_OTHER): Admitting: Pulmonary Disease

## 2024-09-29 ENCOUNTER — Encounter (HOSPITAL_BASED_OUTPATIENT_CLINIC_OR_DEPARTMENT_OTHER): Payer: Self-pay | Admitting: Pulmonary Disease

## 2024-09-29 VITALS — BP 132/56 | HR 85 | Ht 59.0 in | Wt 105.0 lb

## 2024-09-29 DIAGNOSIS — R0602 Shortness of breath: Secondary | ICD-10-CM

## 2024-09-29 DIAGNOSIS — J449 Chronic obstructive pulmonary disease, unspecified: Secondary | ICD-10-CM

## 2024-09-29 DIAGNOSIS — G4734 Idiopathic sleep related nonobstructive alveolar hypoventilation: Secondary | ICD-10-CM | POA: Diagnosis not present

## 2024-09-29 DIAGNOSIS — J432 Centrilobular emphysema: Secondary | ICD-10-CM

## 2024-09-29 LAB — CBC
Hematocrit: 32.9 % — ABNORMAL LOW (ref 34.0–46.6)
Hemoglobin: 10.1 g/dL — ABNORMAL LOW (ref 11.1–15.9)
MCH: 25.9 pg — ABNORMAL LOW (ref 26.6–33.0)
MCHC: 30.7 g/dL — ABNORMAL LOW (ref 31.5–35.7)
MCV: 84 fL (ref 79–97)
Platelets: 303 10*3/uL (ref 150–450)
RBC: 3.9 x10E6/uL (ref 3.77–5.28)
RDW: 22.6 % — ABNORMAL HIGH (ref 11.7–15.4)
WBC: 11.6 10*3/uL — ABNORMAL HIGH (ref 3.4–10.8)

## 2024-09-29 MED ORDER — ALBUTEROL SULFATE HFA 108 (90 BASE) MCG/ACT IN AERS
1.0000 | INHALATION_SPRAY | RESPIRATORY_TRACT | 6 refills | Status: AC | PRN
  Filled 2024-09-29: qty 18, 30d supply, fill #0

## 2024-09-29 MED ORDER — ARFORMOTEROL TARTRATE 15 MCG/2ML IN NEBU: 15.0000 ug | INHALATION_SOLUTION | Freq: Two times a day (BID) | RESPIRATORY_TRACT | 11 refills | Status: AC

## 2024-09-29 MED ORDER — BUDESONIDE 0.5 MG/2ML IN SUSP: 0.5000 mg | Freq: Two times a day (BID) | RESPIRATORY_TRACT | 11 refills | Status: AC

## 2024-09-29 NOTE — Patient Instructions (Addendum)
--  ORDER CBC today --Will call regarding nebulizer plan  Nebulizer plan   --INCREASE Pulmicort /Budesonide  ONE neb in the morning and evening  --Brovana /aformoterol ONE neb in the morning and evening. Will need to investigate if this is covered by insurance  --Duoneb/ipratropium-albuterol  ONE neb mid-day and AS NEEDED

## 2024-09-29 NOTE — Progress Notes (Signed)
 "  Synopsis: Referred in 2017 for COPD by Rollene Almarie LABOR, MD  Subjective:   PATIENT ID: Sherry Lambert GENDER: female DOB: March 31, 1941, MRN: [id number]  Chief Complaint  Patient presents with   Emphysema   Sherry Lambert is a 84 year old female former smoker with COPD, hx CVA in 2016 who presents for follow-up.   Synopsis: She is a former patient of Dr. Gretta. She was diagnosed with COPD in 2016. Her last COPD exacerbation was one year ago Feb 2021 requiring hospitalization. She transitioned from Trelegy to Anoro in March 2022 due to thrush and reports symptoms remain well-controlled.  12/20/20 Since our last visit, she reports her symptoms are well contolled on Anoro. Sherry Lambert has resolved off inhaled corticosteroids. After sitting three hours outside, she used her Albuterol  yesterday for shortness of breath and nasal congestion and believes this is related to the pollen. Otherwise she does routinely use her rescue inhaler. Denies wheezing or cough.  10/10/21 Since our last visit she is compliant with Anoro. She is ambulatory with a walker at home. In the last two weeks she has had worsening shortness of breath and has had to use albuterol  once a day. No coughing or wheezing. No fevers or chills. Her last exacerbation was in 05/2021 which was treated with doxycycline . She will do 30 min exercise twice a week with a TV program and the exercises will vary aerobic to strengthening.  02/26/22 Since our last visit she was hospitalized in June for pneumonia. No evidence of aspiration. Improved on antibiotics. [name] with exertion. No wheezing or cough.  08/16/22 Family is present and provides additional history. In the last several months she has had multiple exacerbations including in September, October and November (Augment/Doxy for pneumonia). Duonebs were added to her regimen. At the end of October was started on chronic prednisone  5 mg daily by Dr. Brenna. She continues to have  shortness of breath with minimal exertion. Denies cough or wheezing. She is compliant with her meds but does not feel it is effective and not able to inhale properly. Since her stroke she is weak and feels her breathing has declined with her strength.  11/12/22 Present with son who provides additional history. Since our last visit she has called the office to report the combo nebs are not effective and was seen in the ED on 10/29/22. Discharged with doxycycline , no new O2 requirements. Called office after hours on 11/04/22 and prescribed prednisone  40 mg x 5 days. Compliant with her nebulizer regimen but does feel that the steroid nebulizer causes a filmy taste. Son reports she is overall improved on this regimen with only shortness of breath on exertion. No cough or wheezing. Compliant with nocturnal oxygen .   02/11/23 She was doing well with her current nebulizer regimen. No further thrush after reducing Pulmicort  to daily. However unable to complete nystatin  due to individual packages - needing to cut with knife. In the last two days she is having shortness of breath. Denies wheezing or cough. Denies fevers, chills. Compliant with oxygen . Discussed goals of care and expresses she would want to be DNR. Family present in clinic today.  05/17/23 Since our last visit she feels overall well. Has shortness of breath with exertion. Denies cough, wheezing. On nebulizer regimen as documented below. Can use walker and does standing exercises for 20 min. Able to perform housework including washing dishes, laundry and some cooking. Showers without assistance. Daughter is working on ramp access to her home.  08/22/23 She reports some good days and bad days. [name]. Denies wheezing or cough. Good days are more often. Treated for COPD exacerbation around Thanksgiving and improved after prednisone . Son reports mobility is less and she is ad about lack of independence. Her residual symptoms are worsen.    12/10/23 Son present. Since our last visit she was treated for COPD exacerbation in March 2025. Continues to have shortness of breath. Denies wheezing or cough. Compliant with nebulizers and nightly oxygen . Seen by palliative care and appreciated their care.   05/19/24 Since our last visit she has been treated for exacerbation in May and prolonged course end of August/beginning of September. She completed 2nd course prednisone  and feels well. Son reports this is a better day for her but concerned about recurrent symptoms since she has had >3 exacerbations so far this year. She is compliant with nebulizers. Reports filmy mouth after using inhalers. Concerned about thrush.  Also has had recent mild leg swelling.  08/11/24 Since our last visit she is having worsening shortness of breath with any physical activity including transferring to wheelchair. Occasional phlegm. Denies chronic cough or wheezing. Son provides additional history. She is fatigued with any activity.   09/29/24 Since our last visit she was hospitalized from 09/08/24-09/11/24 for severe anemia treated with transfusion, negative EGD for bleed but demonstrated severe esophageal stenosis s/p dilation. After discharge she was able to walk around the house but still having shortness of breath with activity. Has not needed to use albuterol  as often, previously 3-4 times and now once a day after hospital discharge. Denies fevers or chills.   Prior inhalers: Trelegy - thrush  Social history: Quit in 2004. 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   [name]    Cataract    Chronic kidney disease    kidney stone   Colitis    Colon cancer (HCC) 06/2017   cecum   Diabetes mellitus without complication (HCC)    no meds taken   Emphysema of lung (HCC)    GERD (gastroesophageal reflux disease)    Hyperlipidemia    Hypertension    Peripheral arterial disease    Shortness of  breath    Stroke [facility])    November 2016- Thanksgiving night     Allergies  Allergen Reactions   Lipitor [Atorvastatin ] Itching     Outpatient Medications Prior to Visit  Medication Sig Dispense Refill   acetaminophen  (TYLENOL ) 500 MG tablet Take 500 mg by mouth every 6 (six) hours as needed for mild pain (pain score 1-3).     arformoterol  (BROVANA ) 15 MCG/2ML NEBU Take 2 mLs (15 mcg total) by nebulization 2 (two) times daily. 120 mL 11   aspirin  EC 81 MG tablet Take 1 tablet (81 mg total) by mouth daily. 90 tablet 3   budesonide  (PULMICORT ) 0.5 MG/2ML nebulizer solution Take 2 mLs (0.5 mg total) by nebulization in the morning and at bedtime. 120 mL 11   ferrous sulfate  325 (65 FE) MG tablet Take 1 tablet (325 mg total) by mouth daily with breakfast. 60 tablet 0   furosemide  (LASIX ) 20 MG tablet Take 1 tablet (20 mg total) by mouth daily as needed. 7 tablet 0   gabapentin  (NEURONTIN ) 300 MG capsule Take 2 capsules by mouth twice daily 360 capsule 0   ipratropium-albuterol  (DUONEB) 0.5-2.5 (3) MG/3ML SOLN USE 1 AMPULE IN NEBULIZER EVERY 6 HOURS AS NEEDED (Patient taking differently: Inhale  3 mLs into the lungs every 6 (six) hours as needed (for shortness of breath).) 360 mL 2   mirabegron  ER (MYRBETRIQ ) 50 MG TB24 tablet Take 1 tablet (50 mg total) by mouth daily. 90 tablet 1   nystatin  (MYCOSTATIN ) 100000 UNIT/ML suspension Take 5 mLs (500,000 Units total) by mouth 4 (four) times daily. 140 mL 0   omeprazole  (PRILOSEC) 40 MG capsule Take 1 capsule (40 mg total) by mouth daily. 90 capsule 0   Polyvinyl Alcohol -Povidone (REFRESH OP) Place 1 drop into both eyes daily as needed (For dry eyes).     pravastatin  (PRAVACHOL ) 20 MG tablet Take 1 tablet by mouth once daily 90 tablet 0   predniSONE  (DELTASONE ) 10 MG tablet Take 1 tablet (10 mg total) by mouth daily with breakfast. 30 tablet 3   traMADol  (ULTRAM ) 50 MG tablet TAKE 1 TABLET BY MOUTH ONCE DAILY AS NEEDED (Patient taking differently: Take  50 mg by mouth daily as needed for moderate pain (pain score 4-6).) 30 tablet 5   VITAMIN D  PO Take 1 tablet by mouth daily.     albuterol  (VENTOLIN  HFA) 108 (90 Base) MCG/ACT inhaler Inhale 1-2 puffs into the lungs every 4 (four) hours as needed for wheezing or shortness of breath. 18 g 3   No facility-administered medications prior to visit.    Review of Systems  Constitutional:  Negative for chills, diaphoresis, fever, malaise/fatigue and weight loss.  HENT:  Negative for congestion.   Respiratory:  Positive for shortness of breath. Negative for cough, hemoptysis, sputum production and wheezing.   Cardiovascular:  Negative for chest pain, palpitations and leg swelling.   Objective:   Vitals:   09/29/24 1514  BP: (!) 132/56  Pulse: 85  SpO2: 92%  Weight: 105 lb (47.6 kg)  Height: 4' 11 (1.499 m)   92%  BMI Readings from Last 3 Encounters:  09/29/24 21.21 kg/m  09/21/24 21.21 kg/m  09/09/24 21.20 kg/m   Wt Readings from Last 3 Encounters:  09/29/24 105 lb (47.6 kg)  09/21/24 105 lb (47.6 kg)  09/09/24 104 lb 15 oz (47.6 kg)   Physical Exam: General: Frail and chronically ill-appearing, no acute distress HENT: [place], AT Eyes: EOMI, no scleral icterus Respiratory: Diminished but clear to auscultation bilaterally.  No crackles, wheezing or rales Cardiovascular: RRR, -M/R/G, no JVD Extremities:-Edema,-tenderness Neuro: AAO x4, CNII-XII grossly intact Psych: Normal mood, normal affect  Chest Imaging- films reviewed: CTA Chest 10/27/2019-emphysema, bilateral dependent pleural effusions.  No PE, no significant mediastinal or hilar adenopathy.  Hernia with stomach and abdominal fat contents protruding into the mediastinum. CXR 05/22/21 - COPD. No infiltrate effusion or edema CXR 02/26/22 - Resolution of lower lobe infiltrates CXR 10/29/22 - No acute cardiopulmonary disease CXR 09/08/24 - Emphysema  Pulmonary Functions Testing Results:  2017- severe obstruction with out  significant bronchodilator reversibility.  Moderate diffusion impairment.  Flow volume loop supports obstruction.  2019 spirometry: FVC 1.8 (75%) FEV1 0.7 (39%) Ratio 38%  11/01/20 FVC 1.8 (81%) FEV1 0.83 (50%) Ratio 48  TLC 113% RV 159% DLCO 60% Interpretation: Moderately severe obstructive defect with air trapping and reduced DLCO consistent with emphysema.  ONO study 03/02/22 - SpO2 <88% 15 min 44 seconds. 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 Had >3 exacerbations in 2025. On triple nebs and chronic prednisone  therapy for difficult to control COPD. Ongoing shortness of breath. Will adjust nebulizer regimen as  noted below.  COPD with emphysema -symptomatic but not in exacerbation --CONTINUE nebulizer regimen as follows:  --INCREASE Pulmicort /Budesonide  ONE neb in the morning and evening  --Brovana /aformoterol ONE neb in the morning and evening. Will need to investigate what will be covered when January 1 occurs  --Duoneb/ipratropium-albuterol  ONE neb mid-day and AS NEEDED   >Will order Yupelri on 10/05/24  --Consider Ohtuvayre  next year if symptoms persistent --Encourage exercise with sit and stand and incentive spirometry at least 5x a day  --CONTINUE Albuterol  as needed for shortness of breath or wheezing --Not a candidate for pulmonary rehab right now. No longer working with PT --D/c'd azithromycin  due to ineffectiveness --CONTINUE prednisone  10 mg daily  Nocturnal hypoxemia --CONTINUE 1L O2 nightly for goal >88%. Last recertified 06/2024  GOC --Palliative care following.  Family recently not able to contact services.  If this remains an issue may need rereferral to a new palliative office --Discussed mechanical ventilation or CPR. Patient wishes to be DNR/DNI --Code status updated to DNR/DNI  Recent anemia    Latest Ref Rng & Units 09/21/2024    3:51 PM 09/11/2024    5:29 AM 09/10/2024    6:02 AM  CBC   WBC 4.0 - 10.5 K/uL 10.1  11.8  15.9   Hemoglobin 12.0 - 15.0 g/dL 89.4  89.9  89.5   Hematocrit 36.0 - 46.0 % 33.8  32.5  33.9   Platelets 150.0 - 400.0 K/uL 231.0  259  267    Hg stable in 10 range Obtain CBC today   Health Maintenance Immunization History  Administered Date(s) Administered   Fluad Quad(high Dose 65+) 06/18/2019, 07/13/2020, 07/19/2021, 07/02/2022   INFLUENZA, HIGH DOSE SEASONAL PF 07/27/2013, 05/14/2016, 05/20/2017, 06/05/2018   Influenza,inj,Quad PF,6+ Mos 08/03/2014, 06/21/2015   Influenza,inj,quad, With Preservative 06/04/2019   Influenza-Unspecified 07/04/2012, 06/18/2022, 07/22/2023   PFIZER(Purple Top)SARS-COV-2 Vaccination 11/17/2019, 12/08/2019, 08/17/2020   PNEUMOCOCCAL CONJUGATE-20 09/12/2023   Pneumococcal Conjugate-13 11/11/2015   Pneumococcal-Unspecified 09/03/2010   Td 09/03/2012   CT Lung Screening - not a candidate. Quit smoking > 15 years ago  Orders Placed This Encounter  Procedures   CBC   Meds ordered this encounter  Medications   albuterol  (VENTOLIN  HFA) 108 (90 Base) MCG/ACT inhaler    Sig: Inhale 1-2 puffs into the lungs every 4 (four) hours as needed for wheezing or shortness of breath.    Dispense:  18 g    Refill:  6   I have spent a total time of 32-minutes on the day of the appointment including chart review, data review, collecting history, coordinating care and discussing medical diagnosis and plan with the patient/family. Past medical history, allergies, medications were reviewed. Pertinent imaging, labs and tests included in this note have been reviewed and interpreted independently by me.  Zenna Traister Slater Staff, MD [facility] 09/29/2024 4:39 PM  "

## 2024-09-30 ENCOUNTER — Ambulatory Visit (HOSPITAL_BASED_OUTPATIENT_CLINIC_OR_DEPARTMENT_OTHER): Payer: Self-pay | Admitting: Pulmonary Disease

## 2024-12-22 ENCOUNTER — Ambulatory Visit (HOSPITAL_BASED_OUTPATIENT_CLINIC_OR_DEPARTMENT_OTHER): Admitting: Pulmonary Disease

## 2025-03-08 ENCOUNTER — Ambulatory Visit: Admitting: Internal Medicine
